# Patient Record
Sex: Female | Born: 1940 | Race: Black or African American | Hispanic: No | Marital: Single | Smoking: Former smoker
Health system: Southern US, Community
[De-identification: ages and names within clinical notes are randomized; demographics above are authoritative.]

## PROBLEM LIST (undated history)

## (undated) DIAGNOSIS — N184 Chronic kidney disease, stage 4 (severe): Secondary | ICD-10-CM

## (undated) DIAGNOSIS — I214 Non-ST elevation (NSTEMI) myocardial infarction: Secondary | ICD-10-CM

## (undated) DIAGNOSIS — I5030 Unspecified diastolic (congestive) heart failure: Secondary | ICD-10-CM

## (undated) DIAGNOSIS — Z720 Tobacco use: Secondary | ICD-10-CM

## (undated) DIAGNOSIS — N813 Complete uterovaginal prolapse: Secondary | ICD-10-CM

## (undated) DIAGNOSIS — I2510 Atherosclerotic heart disease of native coronary artery without angina pectoris: Secondary | ICD-10-CM

## (undated) DIAGNOSIS — I10 Essential (primary) hypertension: Secondary | ICD-10-CM

## (undated) DIAGNOSIS — E785 Hyperlipidemia, unspecified: Secondary | ICD-10-CM

## (undated) DIAGNOSIS — D509 Iron deficiency anemia, unspecified: Secondary | ICD-10-CM

## (undated) DIAGNOSIS — I421 Obstructive hypertrophic cardiomyopathy: Secondary | ICD-10-CM

## (undated) DIAGNOSIS — A0472 Enterocolitis due to Clostridium difficile, not specified as recurrent: Secondary | ICD-10-CM

## (undated) DIAGNOSIS — I447 Left bundle-branch block, unspecified: Secondary | ICD-10-CM

## (undated) DIAGNOSIS — Z8614 Personal history of Methicillin resistant Staphylococcus aureus infection: Secondary | ICD-10-CM

## (undated) DIAGNOSIS — I4891 Unspecified atrial fibrillation: Secondary | ICD-10-CM

## (undated) DIAGNOSIS — E119 Type 2 diabetes mellitus without complications: Secondary | ICD-10-CM

## (undated) DIAGNOSIS — Z9289 Personal history of other medical treatment: Secondary | ICD-10-CM

## (undated) DIAGNOSIS — J9690 Respiratory failure, unspecified, unspecified whether with hypoxia or hypercapnia: Secondary | ICD-10-CM

## (undated) HISTORY — DX: Hyperlipidemia, unspecified: E78.5

## (undated) HISTORY — DX: Obstructive hypertrophic cardiomyopathy: I42.1

## (undated) HISTORY — DX: Complete uterovaginal prolapse: N81.3

## (undated) HISTORY — DX: Iron deficiency anemia, unspecified: D50.9

## (undated) HISTORY — DX: Atherosclerotic heart disease of native coronary artery without angina pectoris: I25.10

## (undated) HISTORY — DX: Tobacco use: Z72.0

## (undated) HISTORY — DX: Personal history of Methicillin resistant Staphylococcus aureus infection: Z86.14

## (undated) HISTORY — DX: Unspecified atrial fibrillation: I48.91

## (undated) HISTORY — DX: Personal history of other medical treatment: Z92.89

## (undated) HISTORY — PX: TUBAL LIGATION: SHX77

## (undated) HISTORY — DX: Essential (primary) hypertension: I10

## (undated) HISTORY — DX: Type 2 diabetes mellitus without complications: E11.9

---

## 1998-03-01 ENCOUNTER — Emergency Department (HOSPITAL_COMMUNITY): Admission: EM | Admit: 1998-03-01 | Discharge: 1998-03-01 | Payer: Self-pay | Admitting: Emergency Medicine

## 1998-03-05 ENCOUNTER — Encounter: Admission: RE | Admit: 1998-03-05 | Discharge: 1998-03-05 | Payer: Self-pay | Admitting: Hematology and Oncology

## 1998-03-23 ENCOUNTER — Emergency Department (HOSPITAL_COMMUNITY): Admission: EM | Admit: 1998-03-23 | Discharge: 1998-03-23 | Payer: Self-pay | Admitting: Emergency Medicine

## 1998-03-26 ENCOUNTER — Encounter: Admission: RE | Admit: 1998-03-26 | Discharge: 1998-03-26 | Payer: Self-pay | Admitting: Hematology and Oncology

## 1998-07-10 ENCOUNTER — Emergency Department (HOSPITAL_COMMUNITY): Admission: EM | Admit: 1998-07-10 | Discharge: 1998-07-10 | Payer: Self-pay | Admitting: Emergency Medicine

## 1998-07-11 ENCOUNTER — Emergency Department (HOSPITAL_COMMUNITY): Admission: EM | Admit: 1998-07-11 | Discharge: 1998-07-11 | Payer: Self-pay | Admitting: Emergency Medicine

## 1998-10-29 ENCOUNTER — Encounter: Payer: Self-pay | Admitting: Internal Medicine

## 1998-10-30 ENCOUNTER — Inpatient Hospital Stay (HOSPITAL_COMMUNITY): Admission: EM | Admit: 1998-10-30 | Discharge: 1998-10-31 | Payer: Self-pay | Admitting: Internal Medicine

## 1999-07-20 ENCOUNTER — Emergency Department (HOSPITAL_COMMUNITY): Admission: EM | Admit: 1999-07-20 | Discharge: 1999-07-20 | Payer: Self-pay | Admitting: Emergency Medicine

## 1999-07-20 ENCOUNTER — Encounter: Payer: Self-pay | Admitting: Emergency Medicine

## 1999-08-13 ENCOUNTER — Encounter: Admission: RE | Admit: 1999-08-13 | Discharge: 1999-09-15 | Payer: Self-pay | Admitting: *Deleted

## 1999-09-03 ENCOUNTER — Encounter: Payer: Self-pay | Admitting: Occupational Medicine

## 1999-09-03 ENCOUNTER — Ambulatory Visit (HOSPITAL_COMMUNITY): Admission: RE | Admit: 1999-09-03 | Discharge: 1999-09-03 | Payer: Self-pay | Admitting: Occupational Medicine

## 1999-10-21 ENCOUNTER — Emergency Department (HOSPITAL_COMMUNITY): Admission: EM | Admit: 1999-10-21 | Discharge: 1999-10-22 | Payer: Self-pay | Admitting: Emergency Medicine

## 1999-11-17 ENCOUNTER — Emergency Department (HOSPITAL_COMMUNITY): Admission: EM | Admit: 1999-11-17 | Discharge: 1999-11-17 | Payer: Self-pay | Admitting: Emergency Medicine

## 1999-11-24 ENCOUNTER — Other Ambulatory Visit: Admission: RE | Admit: 1999-11-24 | Discharge: 1999-11-24 | Payer: Self-pay | Admitting: Obstetrics

## 2000-01-19 ENCOUNTER — Emergency Department (HOSPITAL_COMMUNITY): Admission: EM | Admit: 2000-01-19 | Discharge: 2000-01-19 | Payer: Self-pay | Admitting: Emergency Medicine

## 2000-01-19 ENCOUNTER — Encounter: Payer: Self-pay | Admitting: Emergency Medicine

## 2000-01-20 ENCOUNTER — Ambulatory Visit (HOSPITAL_COMMUNITY): Admission: RE | Admit: 2000-01-20 | Discharge: 2000-01-20 | Payer: Self-pay | Admitting: Emergency Medicine

## 2000-01-20 ENCOUNTER — Encounter: Payer: Self-pay | Admitting: Emergency Medicine

## 2000-01-22 ENCOUNTER — Ambulatory Visit (HOSPITAL_COMMUNITY): Admission: RE | Admit: 2000-01-22 | Discharge: 2000-01-22 | Payer: Self-pay | Admitting: Emergency Medicine

## 2000-01-22 ENCOUNTER — Encounter: Payer: Self-pay | Admitting: Emergency Medicine

## 2000-02-03 ENCOUNTER — Emergency Department (HOSPITAL_COMMUNITY): Admission: EM | Admit: 2000-02-03 | Discharge: 2000-02-04 | Payer: Self-pay | Admitting: Emergency Medicine

## 2000-02-04 ENCOUNTER — Encounter: Payer: Self-pay | Admitting: Emergency Medicine

## 2000-04-07 ENCOUNTER — Encounter: Payer: Self-pay | Admitting: Emergency Medicine

## 2000-04-07 ENCOUNTER — Emergency Department (HOSPITAL_COMMUNITY): Admission: EM | Admit: 2000-04-07 | Discharge: 2000-04-07 | Payer: Self-pay | Admitting: Emergency Medicine

## 2000-05-10 ENCOUNTER — Emergency Department (HOSPITAL_COMMUNITY): Admission: EM | Admit: 2000-05-10 | Discharge: 2000-05-10 | Payer: Self-pay | Admitting: Internal Medicine

## 2001-10-03 ENCOUNTER — Emergency Department (HOSPITAL_COMMUNITY): Admission: EM | Admit: 2001-10-03 | Discharge: 2001-10-03 | Payer: Self-pay | Admitting: Emergency Medicine

## 2001-10-04 ENCOUNTER — Emergency Department (HOSPITAL_COMMUNITY): Admission: EM | Admit: 2001-10-04 | Discharge: 2001-10-04 | Payer: Self-pay | Admitting: Emergency Medicine

## 2005-05-31 ENCOUNTER — Emergency Department (HOSPITAL_COMMUNITY): Admission: EM | Admit: 2005-05-31 | Discharge: 2005-05-31 | Payer: Self-pay | Admitting: Emergency Medicine

## 2005-07-07 ENCOUNTER — Ambulatory Visit (HOSPITAL_COMMUNITY): Admission: RE | Admit: 2005-07-07 | Discharge: 2005-07-07 | Payer: Self-pay | Admitting: Obstetrics

## 2006-12-20 ENCOUNTER — Encounter: Admission: RE | Admit: 2006-12-20 | Discharge: 2006-12-20 | Payer: Self-pay | Admitting: Internal Medicine

## 2007-10-24 ENCOUNTER — Ambulatory Visit (HOSPITAL_COMMUNITY): Admission: RE | Admit: 2007-10-24 | Discharge: 2007-10-24 | Payer: Self-pay | Admitting: Obstetrics

## 2008-10-06 ENCOUNTER — Emergency Department (HOSPITAL_COMMUNITY): Admission: EM | Admit: 2008-10-06 | Discharge: 2008-10-06 | Payer: Self-pay | Admitting: Emergency Medicine

## 2008-12-30 ENCOUNTER — Inpatient Hospital Stay (HOSPITAL_COMMUNITY): Admission: EM | Admit: 2008-12-30 | Discharge: 2009-01-03 | Payer: Self-pay | Admitting: Emergency Medicine

## 2008-12-30 ENCOUNTER — Ambulatory Visit: Payer: Self-pay | Admitting: Critical Care Medicine

## 2008-12-30 ENCOUNTER — Encounter (INDEPENDENT_AMBULATORY_CARE_PROVIDER_SITE_OTHER): Payer: Self-pay | Admitting: Internal Medicine

## 2008-12-30 ENCOUNTER — Ambulatory Visit: Payer: Self-pay | Admitting: Internal Medicine

## 2009-01-03 ENCOUNTER — Encounter: Payer: Self-pay | Admitting: Internal Medicine

## 2009-01-07 ENCOUNTER — Encounter: Payer: Self-pay | Admitting: Cardiology

## 2009-01-07 DIAGNOSIS — N185 Chronic kidney disease, stage 5: Secondary | ICD-10-CM

## 2009-01-08 ENCOUNTER — Encounter: Payer: Self-pay | Admitting: Cardiology

## 2009-01-08 LAB — CONVERTED CEMR LAB
BUN: 23 mg/dL (ref 6–23)
Calcium: 10.4 mg/dL (ref 8.4–10.5)
GFR calc non Af Amer: 35.96 mL/min (ref >60)
Glucose, Bld: 83 mg/dL (ref 70–99)
Potassium: 3.4 meq/L — ABNORMAL LOW (ref 3.5–5.1)
Sodium: 140 meq/L (ref 135–145)

## 2009-01-15 ENCOUNTER — Ambulatory Visit: Payer: Self-pay | Admitting: Cardiology

## 2009-01-15 DIAGNOSIS — E876 Hypokalemia: Secondary | ICD-10-CM

## 2009-01-17 LAB — CONVERTED CEMR LAB
CO2: 26 meq/L (ref 19–32)
Calcium: 10.3 mg/dL (ref 8.4–10.5)
Chloride: 110 meq/L (ref 96–112)
Creatinine, Ser: 1.6 mg/dL — ABNORMAL HIGH (ref 0.4–1.2)
Sodium: 141 meq/L (ref 135–145)

## 2009-01-20 ENCOUNTER — Telehealth: Payer: Self-pay | Admitting: Cardiology

## 2009-10-08 ENCOUNTER — Telehealth: Payer: Self-pay | Admitting: Cardiology

## 2009-10-28 DIAGNOSIS — I10 Essential (primary) hypertension: Secondary | ICD-10-CM | POA: Insufficient documentation

## 2009-10-28 DIAGNOSIS — I509 Heart failure, unspecified: Secondary | ICD-10-CM | POA: Insufficient documentation

## 2009-10-31 ENCOUNTER — Ambulatory Visit: Payer: Self-pay | Admitting: Cardiology

## 2009-10-31 DIAGNOSIS — F17200 Nicotine dependence, unspecified, uncomplicated: Secondary | ICD-10-CM | POA: Insufficient documentation

## 2009-11-04 LAB — CONVERTED CEMR LAB
BUN: 21 mg/dL (ref 6–23)
Chloride: 110 meq/L (ref 96–112)
GFR calc non Af Amer: 51.31 mL/min (ref >60)
Potassium: 4.7 meq/L (ref 3.5–5.1)
Sodium: 142 meq/L (ref 135–145)

## 2009-12-30 ENCOUNTER — Ambulatory Visit: Payer: Self-pay | Admitting: Internal Medicine

## 2009-12-31 ENCOUNTER — Inpatient Hospital Stay (HOSPITAL_COMMUNITY): Admission: EM | Admit: 2009-12-31 | Discharge: 2010-01-01 | Payer: Self-pay | Admitting: Emergency Medicine

## 2010-01-03 ENCOUNTER — Ambulatory Visit: Payer: Self-pay | Admitting: Internal Medicine

## 2010-01-03 ENCOUNTER — Inpatient Hospital Stay (HOSPITAL_COMMUNITY): Admission: EM | Admit: 2010-01-03 | Discharge: 2010-01-05 | Payer: Self-pay | Admitting: Emergency Medicine

## 2010-01-22 ENCOUNTER — Encounter: Payer: Self-pay | Admitting: Cardiology

## 2010-03-28 ENCOUNTER — Ambulatory Visit: Payer: Self-pay | Admitting: Cardiovascular Disease

## 2010-03-28 ENCOUNTER — Ambulatory Visit: Payer: Self-pay | Admitting: Pulmonary Disease

## 2010-03-28 ENCOUNTER — Inpatient Hospital Stay (HOSPITAL_COMMUNITY)
Admission: EM | Admit: 2010-03-28 | Discharge: 2010-04-17 | Payer: Self-pay | Source: Home / Self Care | Attending: Cardiology | Admitting: Cardiology

## 2010-03-28 ENCOUNTER — Ambulatory Visit: Payer: Self-pay | Admitting: Internal Medicine

## 2010-04-01 ENCOUNTER — Encounter: Payer: Self-pay | Admitting: Cardiology

## 2010-04-18 ENCOUNTER — Telehealth (INDEPENDENT_AMBULATORY_CARE_PROVIDER_SITE_OTHER): Payer: Self-pay | Admitting: *Deleted

## 2010-04-30 ENCOUNTER — Encounter: Payer: Self-pay | Admitting: Cardiology

## 2010-05-06 ENCOUNTER — Ambulatory Visit: Payer: Self-pay | Admitting: Cardiology

## 2010-05-06 ENCOUNTER — Encounter: Payer: Self-pay | Admitting: Cardiology

## 2010-05-06 DIAGNOSIS — R5381 Other malaise: Secondary | ICD-10-CM | POA: Insufficient documentation

## 2010-05-06 DIAGNOSIS — R5383 Other fatigue: Secondary | ICD-10-CM

## 2010-05-06 DIAGNOSIS — I428 Other cardiomyopathies: Secondary | ICD-10-CM

## 2010-05-06 DIAGNOSIS — I4891 Unspecified atrial fibrillation: Secondary | ICD-10-CM | POA: Insufficient documentation

## 2010-05-08 LAB — CONVERTED CEMR LAB
CO2: 27 meq/L (ref 19–32)
Calcium: 10.2 mg/dL (ref 8.4–10.5)
Chloride: 107 meq/L (ref 96–112)
Glucose, Bld: 87 mg/dL (ref 70–99)
Potassium: 4 meq/L (ref 3.5–5.1)
Sodium: 139 meq/L (ref 135–145)

## 2010-05-09 ENCOUNTER — Encounter: Payer: Self-pay | Admitting: Cardiology

## 2010-05-15 ENCOUNTER — Telehealth: Payer: Self-pay | Admitting: Cardiology

## 2010-05-18 ENCOUNTER — Encounter: Payer: Self-pay | Admitting: Cardiovascular Disease

## 2010-05-18 ENCOUNTER — Encounter: Payer: Self-pay | Admitting: Cardiology

## 2010-05-18 LAB — CONVERTED CEMR LAB: Prothrombin Time: 12 s

## 2010-05-20 ENCOUNTER — Telehealth: Payer: Self-pay | Admitting: Cardiology

## 2010-05-22 ENCOUNTER — Encounter: Payer: Self-pay | Admitting: Cardiology

## 2010-05-22 LAB — CONVERTED CEMR LAB
POC INR: 1.1
Prothrombin Time: 11 s

## 2010-05-27 ENCOUNTER — Encounter: Payer: Self-pay | Admitting: Internal Medicine

## 2010-05-27 ENCOUNTER — Encounter: Payer: Self-pay | Admitting: Cardiology

## 2010-05-27 LAB — CONVERTED CEMR LAB: POC INR: 1.2

## 2010-06-03 ENCOUNTER — Telehealth: Payer: Self-pay | Admitting: Cardiology

## 2010-06-03 ENCOUNTER — Encounter: Payer: Self-pay | Admitting: Cardiovascular Disease

## 2010-06-03 LAB — CONVERTED CEMR LAB: POC INR: 1.7

## 2010-06-08 ENCOUNTER — Encounter: Payer: Self-pay | Admitting: Cardiology

## 2010-06-09 NOTE — Miscellaneous (Signed)
  Clinical Lists Changes  Observations: Added new observation of CARDCATHFIND: CONCLUSIONS: 1. What appears left ventricular hypertrophy with what appears to be     vigorous left ventricular systolic function with mid cavity     obliteration 2. Scattered irregularities with moderate vessel ectasia.   DISPOSITION:  The patient will be seen by Dr. Antoine Poche.  Atrial fibrillation will need to be controlled.  She will need to be switched in terms of her drugs.     (10/31/2009 11:20)      Cardiac Cath  Procedure date:  10/31/2009  Findings:      CONCLUSIONS: 1. What appears left ventricular hypertrophy with what appears to be     vigorous left ventricular systolic function with mid cavity     obliteration 2. Scattered irregularities with moderate vessel ectasia.   DISPOSITION:  The patient will be seen by Dr. Antoine Poche.  Atrial fibrillation will need to be controlled.  She will need to be switched in terms of her drugs.      Appended Document: Norpace Question The patient should not restart the medication given the events in the hospital.  If she goes back into atrial fibrillation she would need to be rehospitalized to restar this medication

## 2010-06-09 NOTE — Assessment & Plan Note (Signed)
Summary: rov/jss   Visit Type:  Follow-up Primary Provider:  None  CC:  Diastolic Heart Failure.  History of Present Illness: The patient presents for her first office visit following hospitalization in August of last year. She had hypertensive urgency, acute on chronic diastolic heart failure in brief SVT. She has failed to show up for followup until now. She does report taking her medicines and actually checks blood pressures at home. She reports that typically elevated in the 160s systolic and above 100 diastolic. She has not reported any palpitations, presyncope or syncope. She's not had any of the acute shortness of breath she had last year. She does get short breath walking a city block. She sleeps chronically on 2 pillows. She is not describing any PND. She is not describing chest pressure, neck or arm discomfort. She has no weight gain or swelling. She says she avoids salt.  Current Medications (verified): 1)  Klor-Con M20 20 Meq Cr-Tabs (Potassium Chloride Crys Cr) .Marland Kitchen.. 1 Tablet Every Day 2)  Amlodipine Besylate 5 Mg Tabs (Amlodipine Besylate) .... Take One Tablet By Mouth Daily 3)  Metoprolol Tartrate 50 Mg Tabs (Metoprolol Tartrate) .Marland Kitchen.. 1 By Mouth Two Times A Day 4)  Iron 325 (65 Fe) Mg Tabs (Ferrous Sulfate) .Marland Kitchen.. 1 By Mouth Two Times A Day  Allergies (verified): No Known Drug Allergies  Past History:  Past Medical History: Hypertension.  CHF with preserved ejection fraction SVT Tobacco abuse Chronic renal insufficiency  Review of Systems       As stated in the HPI and negative for all other systems.   Vital Signs:  Patient profile:   70 year old female Height:      63 inches Weight:      165 pounds BMI:     29.33 Pulse rate:   55 / minute Resp:     18 per minute BP sitting:   162 / 82  (right arm)  Vitals Entered By: Marrion Coy, CNA (October 31, 2009 11:07 AM)  Physical Exam  General:  Well developed, well nourished, in no acute distress. Head:   normocephalic and atraumatic Eyes:  PERRLA/EOM intact; conjunctiva and lids normal. Neck:  Neck supple, no JVD. No masses, thyromegaly or abnormal cervical nodes. Chest Wall:  no deformities or breast masses noted Lungs:  Clear bilaterally to auscultation and percussion. Heart:  Non-displaced PMI, chest non-tender; regular rate and rhythm, S1, S2 without murmurs, rubs or gallops. Carotid upstroke normal, no bruit. Normal abdominal aortic size, no bruits. Femorals normal pulses, no bruits. Pedals normal pulses. No edema, no varicosities. Abdomen:  Bowel sounds positive; abdomen soft and non-tender without masses, organomegaly, or hernias noted. No hepatosplenomegaly. Msk:  Back normal, normal gait. Muscle strength and tone normal. Extremities:  No clubbing or cyanosis. Neurologic:  Alert and oriented x 3. Skin:  Intact without lesions or rashes. Cervical Nodes:  no significant adenopathy Psych:  Normal affect.   EKG  Procedure date:  10/31/2009  Findings:      Sinus bradycardia, rate 55, axis within normal limits, intervals within normal limits, left ventricular hypertrophy with repolarization changes  Impression & Recommendations:  Problem # 1:  HYPERTENSION (ICD-401.9) Her blood pressure is not controlled. I will increase her Norvasc to 10 mg daily. She will keep a check at home. I discussed with her the importance of getting a primary care doctor to follow her chronic issues. Orders: EKG w/ Interpretation (93000) TLB-BMP (Basic Metabolic Panel-BMET) (80048-METABOL)  Problem # 2:  CHF (ICD-428.0)  She seems to be euvolemic. No further cardiovascular testing is suggested. Orders: EKG w/ Interpretation (93000)  Problem # 3:  TOBACCO ABUSE (ICD-305.1) We discussed the need to stop smoking. She could not afford Chantix and so will try cold [place].  Problem # 4:  CHRONIC KIDNEY DISEASE STAGE V (ICD-585.5) I will check a basic metabolic profile today.  Patient Instructions: 1)   Your physician recommends that you schedule a follow-up appointment in: 6 months with Dr Antoine Poche 2)  Your physician recommends that you haver lab work today 3)  Your physician has recommended you make the following change in your medication: Increase Amlodipine to 10 mg  a day 4)  You have been diagnosed with Congestive Heart Failure or CHF.  CHF is a condition in which a problem with the structure or function of the heart impairs its ability to supply sufficient blood flow to meet the body's needs.  For further information please visit www.cardiosmart.org for detailed information on CHF. 5)  Your physician discussed the hazards of tobacco use.  Tobacco use cessation is recommended and techniques and options to help you quit were discussed. 6)  Your physician recommends that you weigh, daily, at the same time every day, and in the same amount of clothing.  Please record your daily weights on the handout provided and bring it to your next appointment. Prescriptions: AMLODIPINE BESYLATE 10 MG TABS (AMLODIPINE BESYLATE) one daily  #90 x 3   Entered by:   Charolotte Capuchin, RN   Authorized by:   Rollene Rotunda, MD, [facility]   Signed by:   Charolotte Capuchin, RN on 10/31/2009   Method used:   [facility]        [facility] [phone]* (retail)       [address]       [place], [place]  [address]       Ph: [phone] or [phone]       Fax: [phone]   RxID:   [id number]

## 2010-06-09 NOTE — Progress Notes (Signed)
Summary:  med & appt  Phone Note Call from Patient Call back at Home Phone [phone] Call back at [phone]   Caller: Patient Reason for Call: Refill Medication, Talk to Nurse Summary of Call: amilodipine besylate 5mg  tablets [facility]. Pt wants to make appt with Dr Excell Seltzer  Initial call taken by: Edman Circle,  October 08, 2009 9:54 AM  Follow-up for Phone Call        I spoke with the pt and she needs a refill on her Amlodipine.  This is a medication that was prescribed during her hospitalization in August of 2010. The pt would like to schedule an appt to follow-up with Dr Antoine Poche.  I will have Dr [place]'s scheduler contact the pt for an appt. Follow-up by: Julieta Gutting, RN, BSN,  October 08, 2009 7:10 PM    New/Updated Medications: AMLODIPINE BESYLATE 5 MG TABS (AMLODIPINE BESYLATE) Take one tablet by mouth daily Prescriptions: AMLODIPINE BESYLATE 5 MG TABS (AMLODIPINE BESYLATE) Take one tablet by mouth daily  #30 x 1   Entered by:   Julieta Gutting, RN, BSN   Authorized by:   Rollene Rotunda, MD, [facility]   Signed by:   Julieta Gutting, RN, BSN on 10/08/2009   Method used:   [facility] to        [facility] [phone]* (retail)       [address]       [place], [place]  [address]       Ph: [phone] or [phone]       Fax: [phone]   RxID:   [id number]

## 2010-06-10 ENCOUNTER — Encounter: Payer: Self-pay | Admitting: Internal Medicine

## 2010-06-10 ENCOUNTER — Encounter: Payer: Self-pay | Admitting: Cardiology

## 2010-06-11 NOTE — Progress Notes (Signed)
Summary: c/o b/p 142/90 . pls called in all refill  Phone Note Call from Patient Call back at Home Phone [phone] Message from:  Patient on June 03, 2010 10:52 AM  Refills Requested: Medication #1:  METOPROLOL TARTRATE 50 MG TABS 1 by mouth two times a day Caller: Daughter-cynthia [phone] Reason for Call: Talk to Nurse Summary of Call: c/o b/p 140/92 taken about 10 min ago. pls called in all refill for pt.  Initial call taken by: Lorne Skeens,  June 03, 2010 10:54 AM  Follow-up for Phone Call        I spoke with pt's daughter and reviewed all active meds with her post discharge from hospital and rehab.  [facility] [address]  Follow-up by: Lisabeth Devoid RN,  June 03, 2010 11:20 AM    Prescriptions: PROTONIX 40 MG TBEC (PANTOPRAZOLE SODIUM) 1 by mouth daily  #30 x 6   Entered by:   Lisabeth Devoid RN   Authorized by:   Rollene Rotunda, MD, [facility]   Signed by:   Lisabeth Devoid RN on 06/03/2010   Method used:   [facility]ronically to        [facility] [phone]* (retail)       [address]       [place], [place]  [address]       Ph: [phone] or [phone]       Fax: [phone]   RxID:   [id number] NITROSTAT 0.4 MG SUBL (NITROGLYCERIN) as needed  #25 x 6   Entered by:   Lisabeth Devoid RN   Authorized by:   Rollene Rotunda, MD, [facility]   Signed by:   Lisabeth Devoid RN on 06/03/2010   Method used:   [facility]ronically to        [facility] [phone]* (retail)       [address], [place]  [address]       Ph: [phone] or [phone]       Fax: [phone]   RxID:   [id number] DISOPYRAMIDE PHOSPHATE 100 MG CAPS (DISOPYRAMIDE PHOSPHATE) 1 by mouth q12 hours  #60 x 6   Entered by:   Lisabeth Devoid RN   Authorized by:   Rollene Rotunda, MD, [facility]   Signed by:   Lisabeth Devoid RN on 06/03/2010   Method used:   [facility]ronically to        [facility] [phone]* (retail)       [address], [place]  [address]       Ph: [phone] or [phone]  Fax: [phone]   RxID:   [id number] IRON 325 (65 FE) MG TABS (FERROUS SULFATE) 1 by mouth two times a day  #60 x 6   Entered by:   Lisabeth Devoid RN   Authorized by:   Rollene Rotunda, MD, [facility]   Signed by:   Lisabeth Devoid RN on 06/03/2010   Method used:   Electronically to        [facility] [phone]* (retail)       [address]       [place], [place]  [address]       Ph: [phone] or [phone]       Fax: [phone]   RxID:   [id number]

## 2010-06-11 NOTE — Medication Information (Signed)
Summary: Coumadin Clinic  Anticoagulant Therapy  Managed by: Bethena Midget, RN, BSN Referring MD: Antoine Poche PCP: None Supervising MD: Eden Emms MD, Cherry Turlington Indication 1: Atrial Fibrillation Lab Used: [facility] [place] Site: [address] INR POC 1.7 INR RANGE 2.0-3.0  Dietary changes: no    Health status changes: no    Bleeding/hemorrhagic complications: no    Recent/future hospitalizations: no    Any changes in medication regimen? no    Recent/future dental: no  Any missed doses?: no       Is patient compliant with meds? yes       Allergies: No Known Drug Allergies  Anticoagulation Management History:      Her anticoagulation is being managed by telephone today.  Positive risk factors for bleeding include an age of 70 years or older.  The bleeding index is 'intermediate risk'.  Positive CHADS2 values include History of CHF and History of HTN.  Negative CHADS2 values include Age > 46 years old.  Anticoagulation responsible provider: Eden Emms MD, Theron Arista.  INR POC: 1.7.    Anticoagulation Management Assessment/Plan:      The patient's current anticoagulation dose is Warfarin sodium 7.5 mg tabs: Use as directed by Anticoagulation Clinic.  The target INR is 2.0-3.0.  The next INR is due 06/10/2010.  Anticoagulation instructions were given to home health nurse.  Results were reviewed/authorized by Bethena Midget, RN, BSN.  She was notified by Bethena Midget, RN, BSN.         Prior Anticoagulation Instructions: INR 1.2 Today take extra 1/2 pill the change dose to 1 pill everyday except 1/2 pill on Tuesdays and Saturdays. Rechek in one week. Orders given to Debbie while she was at home.   Current Anticoagulation Instructions: INR 1.7 Today take extra 1/2 pill  then Change dose to 1 pill everyday except 1/2 pill on Tuesdays. Recheck in one week. Orders given to Debbie with [facility] while at home with pt.

## 2010-06-11 NOTE — Progress Notes (Signed)
Summary: CALLING ABOUT PTINR  Phone Note Other Incoming   Caller: [facility]/ Weston Anna [phone] Summary of Call: NEED TO KNOW WHEN THE PT NEXT PTINR NEED TO BE CHECKED Initial call taken by: Judie Grieve,  May 15, 2010 4:08 PM  Follow-up for Phone Call        [facility] for Debbie.  Weston Brass PharmD  May 15, 2010 4:47 PM   Additional Follow-up for Phone Call Additional follow up Details #1::        Spoke with Eunice Blase and informed her that pt was in rehab and we do not have any previous INR's or dosing. Thus, we would appreciate if she could check INR today.  She states she will attempt to add pt to schedule.  Additional Follow-up by: Bethena Midget, RN, BSN,  May 18, 2010 8:57 AM

## 2010-06-11 NOTE — Medication Information (Signed)
Summary: Coumadin Clinic  Anticoagulant Therapy  Managed by: Bethena Midget, RN, BSN Referring MD: Antoine Poche PCP: None Supervising MD: Graciela Husbands MD, Viviann Spare Indication 1: Atrial Fibrillation Lab Used: [facility] [place] Site: [address] INR POC 1.2 INR RANGE 2.0-3.0  Dietary changes: no    Health status changes: no    Bleeding/hemorrhagic complications: no    Recent/future hospitalizations: no    Any changes in medication regimen? no    Recent/future dental: no  Any missed doses?: no       Is patient compliant with meds? yes      Comments: Per Fabian Sharp nurse with [name]  Allergies: No Known Drug Allergies  Anticoagulation Management History:      Her anticoagulation is being managed by telephone today.  Positive risk factors for bleeding include an age of 70 years or older.  The bleeding index is 'intermediate risk'.  Positive CHADS2 values include History of CHF and History of HTN.  Negative CHADS2 values include Age > 14 years old.  Anticoagulation responsible provider: Graciela Husbands MD, Viviann Spare.  INR POC: 1.2.    Anticoagulation Management Assessment/Plan:      The patient's current anticoagulation dose is Coumadin 4 mg tabs: UAD.  The target INR is 2.0-3.0.  The next INR is due 06/03/2010.  Anticoagulation instructions were given to home health nurse.  Results were reviewed/authorized by Bethena Midget, RN, BSN.  She was notified by Bethena Midget, RN, BSN.         Prior Anticoagulation Instructions: INR 1.1 Today take 7.5mg s then change dose to 3.75mg s daily except 7.5mg s on Tuesdays and Saturdays. Recheck in one week. Orders given to [facility] nurse at [phone]  Current Anticoagulation Instructions: INR 1.2 Today take extra 1/2 pill the change dose to 1 pill everyday except 1/2 pill on Tuesdays and Saturdays. Rechek in one week. Orders given to Debbie while she was at home.

## 2010-06-11 NOTE — Medication Information (Signed)
Summary: Coumadin Clinic  Anticoagulant Therapy  Managed by: Bethena Midget, RN, BSN Referring MD: Antoine Poche PCP: None Supervising MD: Antoine Poche MD, Fayrene Fearing Indication 1: Atrial Fibrillation Lab Used: [facility] [place] Site: [address] PT 11.0 INR POC 1.1 INR RANGE 2.0-3.0  Dietary changes: no    Health status changes: no    Bleeding/hemorrhagic complications: no    Recent/future hospitalizations: no    Any changes in medication regimen? no    Recent/future dental: no  Any missed doses?: no       Is patient compliant with meds? yes       Allergies: No Known Drug Allergies  Anticoagulation Management History:      Her anticoagulation is being managed by telephone today.  Positive risk factors for bleeding include an age of 70 years or older.  The bleeding index is 'intermediate risk'.  Positive CHADS2 values include History of CHF and History of HTN.  Negative CHADS2 values include Age > 47 years old.  Prothrombin time is 11.0.  Anticoagulation responsible provider: Antoine Poche MD, Fayrene Fearing.  INR POC: 1.1.    Anticoagulation Management Assessment/Plan:      The patient's current anticoagulation dose is Coumadin 4 mg tabs: UAD.  The target INR is 2.0-3.0.  The next INR is due 05/27/2010.  Results were reviewed/authorized by Bethena Midget, RN, BSN.  She was notified by Bethena Midget, RN, BSN.         Prior Anticoagulation Instructions: INR 1.2  Spoke with [facility] RN while at pt's home advised to have pt start taking Coumadin 7.5mg  1/2 tablet daily.  Recheck INR on 05/22/10.    Current Anticoagulation Instructions: INR 1.1 Today take 7.5mg s then change dose to 3.75mg s daily except 7.5mg s on Tuesdays and Saturdays. Recheck in one week. Orders given to [facility] nurse at [phone]

## 2010-06-11 NOTE — Progress Notes (Signed)
Summary: Norpace Question  Phone Note From Other Clinic Call back at [phone]   Caller: Crystal from [facility] Reason for Call: Medication Check Summary of Call: (This is a late entry from 12/9 at 830p, as my internet service was out)   Returned call from skilled nursing facility concerning the inabilty of facility to get the pt's discharge medication norpace.  Their pharmacy did not have th rx and would not be able to get it until Monday.  They had also called pharmacys in the area that were unable to get the rx until Monday as well.  The facility was calling to see if the rx could be replaced by another rx.  I spoke with SK, as I was unsure the best way to approcach the issue.  He stated that if the facility was unable to get the rx then the pt would have to go without and not to restart the med on Monday.  The issue will need to be discussed with the pts primary physician, [facility] as whether to restart the rx or not.  I informed the facility of the conversation I had with SK and they voiced understanding.  They asked for our office to let them know if they needed to restart the med on Monday, I told them the nurse would call to let them know.  They were appreciative of the return call.   Initial call taken by: Robbi Garter NP-PA,  April 18, 2010 5:31 PM     Appended Document: Norpace Question The patient should not restart the medication given the events in the hospital.  If she goes back into atrial fibrillation she would need to be rehospitalized to restar this medication  Appended Document: Norpace Question Crystal aware pt doesn't need to restart Norpace at this time and if she goes back into At Fib then she would be to be rehospitalized and restarted then

## 2010-06-11 NOTE — Miscellaneous (Signed)
  Clinical Lists Changes  Observations: Added new observation of CARDCATHFIND: IMPRESSION:  The patient has diffuse mild-to-moderate coronary disease. There is no severe discrete stenosis and no culprit lesion.  The continuation of the AV circumflex beyond the first obtuse marginal had severe diffuse disease.  However, this was present to a lesser degree on the catheterization in August 2011.  I do not believe this is the source of her symptoms today.  Ejection fraction is preserved.  Heart rate was in the 40s during the procedure with the appearance of heart block.  We placed a temporary transvenous pacemaker.  Once the temporary transvenous pacemaker was placed, we were able to titrate the patient off dopamine.  We set the rate at 70 with good capture with a threshold of about 0.9 mA.  We will plan on restarting the heparin drip given her atrial fibrillation 6 hours post-sheath pull.     (03/31/2010 10:34)      Cardiac Cath  Procedure date:  03/31/2010  Findings:      IMPRESSION:  The patient has diffuse mild-to-moderate coronary disease. There is no severe discrete stenosis and no culprit lesion.  The continuation of the AV circumflex beyond the first obtuse marginal had severe diffuse disease.  However, this was present to a lesser degree on the catheterization in August 2011.  I do not believe this is the source of her symptoms today.  Ejection fraction is preserved.  Heart rate was in the 40s during the procedure with the appearance of heart block.  We placed a temporary transvenous pacemaker.  Once the temporary transvenous pacemaker was placed, we were able to titrate the patient off dopamine.  We set the rate at 70 with good capture with a threshold of about 0.9 mA.  We will plan on restarting the heparin drip given her atrial fibrillation 6 hours post-sheath pull.      Appended Document:     Clinical Lists Changes  Observations: Added new observation of  ECHOINTERP: - Left ventricle: Wall thickness was increased in a pattern of     moderate LVH. Systolic function was vigorous. The estimated     ejection fraction was in the range of 65% to 70%. There was     dynamic obstruction at rest in the outflow tract, with a peak     velocity of 511cm/sec and a peak gradient of Hg.   - Mitral valve: Calcified annulus. Mildly thickened leaflets .   - Left atrium: The atrium was moderately dilated. (04/01/2010 10:35)       Echocardiogram  Procedure date:  04/01/2010  Findings:      - Left ventricle: Wall thickness was increased in a pattern of     moderate LVH. Systolic function was vigorous. The estimated     ejection fraction was in the range of 65% to 70%. There was     dynamic obstruction at rest in the outflow tract, with a peak     velocity of 511cm/sec and a peak gradient of Hg.   - Mitral valve: Calcified annulus. Mildly thickened leaflets .   - Left atrium: The atrium was moderately dilated.

## 2010-06-11 NOTE — Progress Notes (Signed)
Summary: question s about getting generic brands in medications  Phone Note Call from Patient Call back at Home Phone [phone]   Caller: Daughter/ [name] Summary of Call: pt daughter regarding getting generic brand s of medication Initial call taken by: Judie Grieve,  May 20, 2010 1:37 PM  Follow-up for Phone Call        pt daughter adv that pt has applied for medicaid and will take a few more weeks to get. in the mean time she cannot afford her crestor and metoprolol. See that [facility] has Metoprolol on $4 list. Crestor is not on the list. Other options include pravastatin and lovastatin. Will provide samples of Crestor for a few weeks until Dr. can review and decide if alternative med is acceptable. Await pt to c/b to discuss. also since she doesn't have PCP-she also takes protonix and that is not on the $4 list. an option for that would be Famotidine.  Follow-up by: Claris Gladden RN,  May 20, 2010 2:09 PM  Additional Follow-up for Phone Call Additional follow up Details #1::        pt daughter is agreeable to samples and Metoprolol called into [facility] on [address]. She understands that Dr. will have to review the meds.  Additional Follow-up by: Claris Gladden RN,  May 20, 2010 2:17 PM    Additional Follow-up for Phone Call Additional follow up Details #2::    We could try pravastatin 80 mg with repeat lipid and liver in 8 weeks. Follow-up by: Rollene Rotunda, MD, [facility],  May 20, 2010 4:30 PM  New/Updated Medications: PRAVASTATIN SODIUM 80 MG TABS (PRAVASTATIN SODIUM) one daily Prescriptions: PRAVASTATIN SODIUM 80 MG TABS (PRAVASTATIN SODIUM) one daily  #30 x 11   Entered by:   Charolotte Capuchin, RN   Authorized by:   Rollene Rotunda, MD, [facility]   Signed by:   Charolotte Capuchin, RN on 05/22/2010   Method used:   Electronically to        [facility] [phone]* (retail)       [address]       [place], [place]  [address]       Ph: [phone]       Fax: [phone]   RxID:   [id number] METOPROLOL TARTRATE 50 MG TABS (METOPROLOL TARTRATE) 1 by mouth two times a day  #60 x 11   Entered by:   Claris Gladden RN   Authorized by:   Rollene Rotunda, MD, [facility]   Signed by:   Claris Gladden RN on 05/20/2010   Method used:   Electronically to        [facility] [phone]* (retail)       [address]       [place], [place]  [address]       Ph: [phone]       Fax: [phone]   RxID:   [id number]

## 2010-06-11 NOTE — Progress Notes (Signed)
Summary: refill request  Phone Note Refill Request Message from:  [facility]  Refills Requested: Medication #1:  METOPROLOL TARTRATE 50 MG TABS 1 by mouth two times a day  Medication #2:  CRESTOR 20 MG TABS Take one tablet by mouth daily.  Method Requested: Telephone to Pharmacy Initial call taken by: Glynda Jaeger,  May 20, 2010 10:14 AM  Follow-up for Phone Call        RX sent into pharmacy. Pt notified. Marrion Coy, CNA  May 20, 2010 10:32 AM  Follow-up by: Marrion Coy, CNA,  May 20, 2010 10:32 AM    Prescriptions: METOPROLOL TARTRATE 50 MG TABS (METOPROLOL TARTRATE) 1 by mouth two times a day  #60 x 6   Entered by:   Marrion Coy, CNA   Authorized by:   Rollene Rotunda, MD, [facility]   Signed by:   Marrion Coy, CNA on 05/20/2010   Method used:   [facility]        [facility] [phone]* (retail)       [address], [place]  [address]       Ph: [phone] or [phone]       Fax: [phone]   RxID:   [id number] CRESTOR 20 MG TABS (ROSUVASTATIN CALCIUM) Take one tablet by mouth daily.  #30 x 6   Entered by:   Marrion Coy, CNA   Authorized by:   Rollene Rotunda, MD, [facility]   Signed by:   Marrion Coy, CNA on 05/20/2010   Method used:   [facility]        [facility] [phone]* (retail)       [address]       [place], [place]  [address]       Ph: [phone] or [phone]       Fax: [phone]   RxID:   [id number]

## 2010-06-11 NOTE — Medication Information (Signed)
Summary: Coumadin Clinic  Anticoagulant Therapy  Managed by: Cloyde Reams, RN, BSN Referring MD: Antoine Poche PCP: None Supervising MD: Excell Seltzer MD, Casimiro Needle Indication 1: Atrial Fibrillation Lab Used: [facility] [place] Site: [address] PT 12.0 INR POC 1.2 INR RANGE 2.0-3.0  Dietary changes: no    Health status changes: yes       Details: discharged from rehab recently on Coumadin 7.5mg  4 days a week.   Bleeding/hemorrhagic complications: no     Any changes in medication regimen? no     Any missed doses?: no       Is patient compliant with meds? yes      Comments: Needs rx sent to pharmacy for Ferrex 150mg  once daily, Metoprolol 50mg  two times a day, Crestor 10mg  once daily, Protonix 40mg  once daily sent flag to Marrion Coy to [facility]ill [facility] [address]   Allergies: No Known Drug Allergies  Anticoagulation Management History:      Her anticoagulation is being managed by telephone today.  Positive risk factors for bleeding include an age of 21 years or older.  The bleeding index is 'intermediate risk'.  Positive CHADS2 values include History of CHF and History of HTN.  Negative CHADS2 values include Age > 54 years old.  Prothrombin time is 12.0.  Anticoagulation responsible provider: Excell Seltzer MD, Casimiro Needle.  INR POC: 1.2.    Anticoagulation Management Assessment/Plan:      The patient's current anticoagulation dose is Coumadin 4 mg tabs: UAD.  The target INR is 2.0-3.0.  The next INR is due 05/22/2010.  Results were reviewed/authorized by Cloyde Reams, RN, BSN.  She was notified by Cloyde Reams RN.         Current Anticoagulation Instructions: INR 1.2  Spoke with [facility] RN while at pt's home advised to have pt start taking Coumadin 7.5mg  1/2 tablet daily.  Recheck INR on 05/22/10.

## 2010-06-11 NOTE — Letter (Signed)
Summary: Physician Verbal Orders  Physician Verbal Orders   Imported By: Marylou Mccoy 06/01/2010 17:27:05  _____________________________________________________________________  External Attachment:    Type:   Image     Comment:   External Document

## 2010-06-11 NOTE — Assessment & Plan Note (Signed)
Summary: EPH/JML   Visit Type:  Follow-up Primary Provider:  None  CC:  Atrial fibrillation and HCM.  History of Present Illness: The patient presents for followup after hospitalization which started with atrial fibrillation. She subsequently had a bradycardic arrest requiring intubation. At that time she had urgent catheterization but was not found to have obstructive coronary disease. She has a temporary pacemaker but no subsequent bradycardia arrhythmias. She did have prolonged intubation mostly related to poor mechanics. The remainder of her hospitalization was complicated by lethargy and altered mental status. She has since been in rehabilitation and today is her last day before going home. She doesn't remember the events. She has had no acute complaints since discharge. She's had no chest pressure, neck or arm discomfort. She's had no palpitations, presyncope or syncope. She's had no shortness of breath, PND or orthopnea. She's had no weight gain or edema. She is ambulating with a walker and has completed physical therapy.  Current Medications (verified): 1)  Metoprolol Tartrate 50 Mg Tabs (Metoprolol Tartrate) .Marland Kitchen.. 1 By Mouth Two Times A Day 2)  Iron 325 (65 Fe) Mg Tabs (Ferrous Sulfate) .Marland Kitchen.. 1 By Mouth Two Times A Day 3)  Crestor 20 Mg Tabs (Rosuvastatin Calcium) .... Take One Tablet By Mouth Daily. 4)  Tylenol Infants 80 Mg/0.73ml Susp (Acetaminophen) .... As Needed 5)  Aspirin 81 Mg  Tabs (Aspirin) .Marland Kitchen.. 1 By Mouth Daily 6)  Disopyramide Phosphate 100 Mg Caps (Disopyramide Phosphate) .Marland Kitchen.. 1 By Mouth Q12 Hours 7)  Robafen Ac 100-10 Mg/95ml Syrp (Guaifenesin-Codeine) .... As Needed 8)  Mi-Acid 200-200-20 Mg/7ml Susp (Alum & Mag Hydroxide-Simeth) .... Uad 9)  Nitrostat 0.4 Mg Subl (Nitroglycerin) .... As Needed 10)  Protonix 40 Mg Tbec (Pantoprazole Sodium) .Marland Kitchen.. 1 By Mouth Daily 11)  Coumadin 4 Mg Tabs (Warfarin Sodium) .... Uad  Allergies (verified): No Known Drug Allergies  Past  History:  Past Medical History: Acute myocardial infarction (non obstructive CAD) Atrial fibrillation/rapid ventricular response (RVR) Tachy-brady syndrome requiring temporary pacemaker. Pulseless electrical activity (PEA) arrest. 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 Hypertension. Tobacco use. Chronic kidney disease stage 4  Anticoagulation with Coumadin Total procidentia (uterine prolapse), follow up as an outpatient. Iron-deficiency anemia.Hypertension.  CHF with preserved ejection fraction  Vital Signs:  Patient profile:   70 year old female Height:      63 inches Weight:      158 pounds BMI:     28.09 Pulse rate:   68 / minute Resp:     16 per minute BP sitting:   138 / 80  (right arm)  Vitals Entered By: Marrion Coy, CNA (May 06, 2010 10:06 AM)  Physical Exam  General:  Well developed, well nourished, in no acute distress. Head:  normocephalic and atraumatic Neck:  Neck supple, no JVD. No masses, thyromegaly or abnormal cervical nodes. Chest Wall:  no deformities or breast masses noted Lungs:  Clear bilaterally to auscultation and percussion. Abdomen:  Bowel sounds positive; abdomen soft and non-tender without masses, organomegaly, or hernias noted. No hepatosplenomegaly. Msk:  Back normal, normal gait. Muscle strength and tone normal. Extremities:  No clubbing or cyanosis. Neurologic:  Alert and oriented x 3. Skin:  Intact without lesions or rashes. Cervical Nodes:  no significant adenopathy Inguinal Nodes:  no significant adenopathy Psych:  Normal affect.   Detailed Cardiovascular Exam  Neck    Carotids: Carotids full and equal bilaterally without bruits.      Neck Veins:  Normal, no JVD.    Heart    Inspection: no deformities or lifts noted.      Palpation: normal PMI with no thrills palpable.      Auscultation: S1 and S2 within normal limits, no S3, no S4, 3/6 apical systolic murmur  radiating out aortic outflow tract, increased with the strain phase of Valsalva  Vascular    Abdominal Aorta: no palpable masses, pulsations, or audible bruits.      Femoral Pulses: normal femoral pulses bilaterally.      Pedal Pulses: normal pedal pulses bilaterally.      Radial Pulses: normal radial pulses bilaterally.      Peripheral Circulation: no clubbing, cyanosis, or edema noted with normal capillary refill.     EKG  Procedure date:  05/06/2010  Findings:      Sinus rhythm, rate 68, premature ectopic complexes, LVH with repolarization changes  Impression & Recommendations:  Problem # 1:  CARDIOMYOPATHY, IDIOPATHIC HYPERTROPHIC (ICD-425.4) The patient seems to be euvolemic. She will continue with meds as listed with concentration on blood pressure control and salt and fluid management.  Problem # 2:  FIBRILLATION, ATRIAL (ICD-427.31) The patient seems to be maintaining sinus rhythm with no evidence of bradycardia arrhythmia that prompted her arrest.  She will continue with current meds. We will have her Coumadin followed here.  Problem # 3:  TOBACCO ABUSE (ICD-305.1) She is abstaining and I have encouraged continued abstinence.  Problem # 4:  WEAKNESS (ICD-780.79) We discussed at length strategies for avoiding falls in her home now that she will be living independently.  Other Orders: EKG w/ Interpretation (93000) TLB-BMP (Basic Metabolic Panel-BMET) (80048-METABOL)  Patient Instructions: 1)  Your physician recommends that you schedule a follow-up appointment in: 4 months with Dr Antoine Poche 2)  Your physician recommends that you have  lab work in: [facility]  today 3)  Your physician recommends that you continue on your current medications as directed. Please refer to the Current Medication list given to you today.

## 2010-06-17 NOTE — Medication Information (Addendum)
Summary: Coumadin Clinic  Anticoagulant Therapy  Managed by: Bethena Midget, RN, BSN Referring MD: Antoine Poche PCP: None Supervising MD: Ladona Ridgel MD, Sharlot Gowda Indication 1: Atrial Fibrillation Lab Used: [facility] [place] Site: [address] INR POC 1.1 INR RANGE 2.0-3.0  Dietary changes: no    Health status changes: no    Bleeding/hemorrhagic complications: no    Recent/future hospitalizations: no    Any changes in medication regimen? no    Recent/future dental: no  Any missed doses?: no       Is patient compliant with meds? yes       Allergies: No Known Drug Allergies  Anticoagulation Management History:      Her anticoagulation is being managed by telephone today.  Positive risk factors for bleeding include an age of 88 years or older.  The bleeding index is 'intermediate risk'.  Positive CHADS2 values include History of CHF and History of HTN.  Negative CHADS2 values include Age > 36 years old.  Anticoagulation responsible provider: Ladona Ridgel MD, Sharlot Gowda.  INR POC: 1.1.    Anticoagulation Management Assessment/Plan:      The patient's current anticoagulation dose is Warfarin sodium 7.5 mg tabs: Use as directed by Anticoagulation Clinic.  The target INR is 2.0-3.0.  The next INR is due 06/19/2010.  Anticoagulation instructions were given to home health nurse.  Results were reviewed/authorized by Bethena Midget, RN, BSN.  She was notified by Bethena Midget, RN, BSN.         Prior Anticoagulation Instructions: INR 1.7 Today take extra 1/2 pill  then Change dose to 1 pill everyday except 1/2 pill on Tuesdays. Recheck in one week. Orders given to Debbie with [facility] while at home with pt.   Current Anticoagulation Instructions: INR 1.1 Today and tomorrow take 11.25mg s. Then change dose to 7.5mg s daily except 11.25mg s on Sundays. Recheck in 9 days. Orders givne to Debbie with [facility] while at home with pt.

## 2010-06-18 DIAGNOSIS — I4891 Unspecified atrial fibrillation: Secondary | ICD-10-CM

## 2010-06-19 ENCOUNTER — Encounter: Payer: Self-pay | Admitting: Cardiology

## 2010-06-19 ENCOUNTER — Telehealth: Payer: Self-pay | Admitting: Cardiology

## 2010-06-19 ENCOUNTER — Encounter (INDEPENDENT_AMBULATORY_CARE_PROVIDER_SITE_OTHER): Payer: Medicaid Other

## 2010-06-19 DIAGNOSIS — Z7901 Long term (current) use of anticoagulants: Secondary | ICD-10-CM

## 2010-06-19 DIAGNOSIS — I4891 Unspecified atrial fibrillation: Secondary | ICD-10-CM

## 2010-06-25 NOTE — Miscellaneous (Signed)
Summary: Home Health Certification/Care Plan   Home Health Certification/Care Plan   Imported By: Roderic Ovens 06/15/2010 14:36:17  _____________________________________________________________________  External Attachment:    Type:   Image     Comment:   External Document

## 2010-06-25 NOTE — Progress Notes (Addendum)
Summary: DO NOT TAKE Disopyramide  I spoke with pt's daughter [name]) who is aware to not restart disopyramide.  She is with pt and will let her know.  [name], RN    ---- Converted from flag ---- ---- 06/19/2010 4:00 PM, Rollene Rotunda, MD, [facility] wrote: Elita Quick,  I am very leary of restarting the disopyramide on [name] because, probably coincidently, she had a bradycardic arrest soon after starting it in the hospital.  Therefore, she should stay off of it for now.  We will deal with it if she has further atrial arrhythmias.  Please let her daughter know and turn this note into a phone note.  Thanks.  Jake  ---- 06/19/2010 1:22 PM, Windell Hummingbird wrote: Dr. Antoine Poche,   We saw your patient [name] today in the Coumadin Clinic and she brought with her her disopyramide 100mg  capsules. She informed us that she has not taken these capsules since leaving the nursing home approximately 1 month ago.  At first they were too expensive because her Medicaid had not become effective and then once she did get them she did not recognize the capsule so she did not want to take it then either.  Please let her know if you just want her to start on her dose she has at home or if you want to monitor her when she restarts this medication.  Contact number for her daughter [phone]  Thank you. ------------------------------

## 2010-06-25 NOTE — Medication Information (Signed)
Summary: Coumadin Clinic  Anticoagulant Therapy  Managed by: Weston Brass, PharmD Referring MD: Antoine Poche PCP: None Supervising MD: Daleen Squibb MD, Maisie Fus Indication 1: Atrial Fibrillation Lab Used: [facility] [place] Site: [address] INR POC 2.9 INR RANGE 2.0-3.0  Dietary changes: no    Health status changes: no    Bleeding/hemorrhagic complications: no    Recent/future hospitalizations: no    Any changes in medication regimen? no    Recent/future dental: no  Any missed doses?: no       Is patient compliant with meds? yes       Problems Prior to Update: 1)  Cardiomyopathy, Idiopathic Hypertrophic  (ICD-425.4) 2)  Fibrillation, Atrial  (ICD-427.31) 3)  Weakness  (ICD-780.79) 4)  Tobacco Abuse  (ICD-305.1) 5)  CHF  (ICD-428.0) 6)  Hypertension  (ICD-401.9) 7)  Hypopotassemia  (ICD-276.8) 8)  Chronic Kidney Disease Stage V  (ICD-585.5)  Medications Prior to Update: 1)  Metoprolol Tartrate 50 Mg Tabs (Metoprolol Tartrate) .Marland Kitchen.. 1 By Mouth Two Times A Day 2)  Iron 325 (65 Fe) Mg Tabs (Ferrous Sulfate) .Marland Kitchen.. 1 By Mouth Two Times A Day 3)  Tylenol Infants 80 Mg/0.46ml Susp (Acetaminophen) .... As Needed 4)  Aspirin 81 Mg  Tabs (Aspirin) .Marland Kitchen.. 1 By Mouth Daily 5)  Disopyramide Phosphate 100 Mg Caps (Disopyramide Phosphate) .Marland Kitchen.. 1 By Mouth Q12 Hours 6)  Robafen Ac 100-10 Mg/72ml Syrp (Guaifenesin-Codeine) .... As Needed 7)  Mi-Acid 200-200-20 Mg/50ml Susp (Alum & Mag Hydroxide-Simeth) .... Uad 8)  Nitrostat 0.4 Mg Subl (Nitroglycerin) .... As Needed 9)  Protonix 40 Mg Tbec (Pantoprazole Sodium) .Marland Kitchen.. 1 By Mouth Daily 10)  Warfarin Sodium 7.5 Mg Tabs (Warfarin Sodium) .... Use As Directed By Anticoagulation Clinic 11)  Pravastatin Sodium 80 Mg Tabs (Pravastatin Sodium) .... One Daily  Allergies: No Known Drug Allergies  Anticoagulation Management History:      The patient is taking warfarin and comes in today for a routine follow up visit.  Positive risk  factors for bleeding include an age of 55 years or older.  The bleeding index is 'intermediate risk'.  Positive CHADS2 values include History of CHF and History of HTN.  Negative CHADS2 values include Age > 79 years old.  Anticoagulation responsible provider: Daleen Squibb MD, Maisie Fus.  INR POC: 2.9.  Cuvette Lot#: [id number].  Exp: 05/2011.    Anticoagulation Management Assessment/Plan:      The patient's current anticoagulation dose is Warfarin sodium 7.5 mg tabs: Use as directed by Anticoagulation Clinic.  The target INR is 2.0-3.0.  The next INR is due 06/26/2010.  Anticoagulation instructions were given to home health nurse.  Results were reviewed/authorized by Weston Brass, PharmD.  She was notified by Margot Chimes PharmD Candidate.         Prior Anticoagulation Instructions: INR 1.1 Today and tomorrow take 11.25mg s. Then change dose to 7.5mg s daily except 11.25mg s on Sundays. Recheck in 9 days. Orders givne to Debbie with [facility] while at home with pt.   Current Anticoagulation Instructions: INR 2.9  Continue your same dose of Coumadin.  Take 1 tablet everyday except on Sundays when you take 1 and 1/2 tablets.  Recheck INR in 1 week.  Please call our office if you restart your heart medication.  If you do not hear from Dr. Antoine Poche by Tuesday please give him a call.

## 2010-06-26 ENCOUNTER — Encounter (INDEPENDENT_AMBULATORY_CARE_PROVIDER_SITE_OTHER): Payer: Medicare Other

## 2010-06-26 ENCOUNTER — Encounter: Payer: Self-pay | Admitting: Cardiovascular Disease

## 2010-06-26 DIAGNOSIS — Z7901 Long term (current) use of anticoagulants: Secondary | ICD-10-CM

## 2010-06-26 DIAGNOSIS — I4891 Unspecified atrial fibrillation: Secondary | ICD-10-CM

## 2010-06-26 LAB — CONVERTED CEMR LAB: POC INR: 1.5

## 2010-07-01 NOTE — Medication Information (Signed)
Summary: rov/cb  Anticoagulant Therapy  Managed by: Weston Brass, PharmD Referring MD: Antoine Poche PCP: None Supervising MD: Shacara Cozine Indication 1: Atrial Fibrillation Lab Used: [facility] [place] Site: [address] INR POC 1.5 INR RANGE 2.0-3.0  Dietary changes: no    Health status changes: no    Bleeding/hemorrhagic complications: no    Recent/future hospitalizations: no    Any changes in medication regimen? no    Recent/future dental: no  Any missed doses?: no       Is patient compliant with meds? yes      Comments: Patient was instructed not to restart disopyramide  Allergies: No Known Drug Allergies  Anticoagulation Management History:      The patient is taking warfarin and comes in today for a routine follow up visit.  Positive risk factors for bleeding include an age of 70 years or older.  The bleeding index is 'intermediate risk'.  Positive CHADS2 values include History of CHF and History of HTN.  Negative CHADS2 values include Age > 30 years old.  Anticoagulation responsible provider: Seaira Byus.  INR POC: 1.5.  Cuvette Lot#: [id number].  Exp: 06/2011.    Anticoagulation Management Assessment/Plan:      The patient's current anticoagulation dose is Warfarin sodium 7.5 mg tabs: Use as directed by Anticoagulation Clinic.  The target INR is 2.0-3.0.  The next INR is due 07/06/2010.  Anticoagulation instructions were given to home health nurse.  Results were reviewed/authorized by Weston Brass, PharmD.  She was notified by Margot Chimes PharmD Candidate.         Prior Anticoagulation Instructions: INR 2.9  Continue your same dose of Coumadin.  Take 1 tablet everyday except on Sundays when you take 1 and 1/2 tablets.  Recheck INR in 1 week.  Please call our office if you restart your heart medication.  If you do not hear from Dr. Antoine Poche by Tuesday please give him a call.    Current Anticoagulation Instructions: INR 1.5   Take an extra 1/2 tablet  today.  We changed your dose to 1 tablet everyday except on Tuesdays and Saturdays when you take 1 and 1/2 tablets.  Recheck INR in 10 days.

## 2010-07-01 NOTE — Miscellaneous (Signed)
Summary: [facility] [employer] Physician Verbal Orders  [facility] [place] Physician Verbal Orders   Imported By: Roderic Ovens 06/24/2010 14:22:01  _____________________________________________________________________  External Attachment:    Type:   Image     Comment:   External Document

## 2010-07-01 NOTE — Miscellaneous (Signed)
Summary: [facility] Physician Verbal Order  [facility] Physician Verbal Order   Imported By: Roderic Ovens 06/26/2010 15:00:36  _____________________________________________________________________  External Attachment:    Type:   Image     Comment:   External Document

## 2010-07-03 NOTE — H&P (Addendum)
NAME:  [name], [name] NO.:  [id number]  MEDICAL RECORD NO.:  [id number]          PATIENT TYPE:  INP  LOCATION:  1823                         FACILITY:  [facility]  PHYSICIAN:  Florinda Marker, MD DATE OF BIRTH:  1940-09-22  DATE OF ADMISSION:  01/03/2010 DATE OF DISCHARGE:                             HISTORY & PHYSICAL  PHYSICIAN:  Primary cardiologist, Dr. Antoine Poche with [facility].  CHIEF COMPLAINT:  Palpitations, chest pain and shortness of breath.  HISTORY OF PRESENT ILLNESS:  [name] is a 70 year old female with a history of uncontrolled hypertension, heart failure with preserved EF, paroxysmal atrial fibrillation and nonobstructive disease, who presents with an hour of tachy palpitations, shortness of breath and chest pain. Of note, the patient was just discharged on January 01, 2010 when she had a 2-day admission for AFib with RVR.  A fib with a heart ER and was found to have positive biomarkers. She was rate controlled and had spontaneous resolution of her atrial fibrillation.  Of note, the patient had positive biomarkers. She underwent a cardiac catheterization on December 31, 2009, which revealed nonobstructive disease.  She had a 40% mid LAD lesion with 30% diagonal lesion.  She had a 30-40% circumflex lesion and mild luminal irregularities in her RCA. Her EF by ventriculogram was found to be 70%, with no wall motion abnormalities. The patient was discharged on metoprolol 25 mg b.i.d. and full-dose aspirin.  Of note, she reports that she has not been able to have her medications filled.  She did give her prescriptions to her son who worked throughout the night and is supposed to pick up her medications after work today.  Around 11 a.m., the patient developed a very rapid heart rate and shortly thereafter developed significant shortness of breath and inability to catch her breath.  She also complained of substernal chest pressure.  In the ED  the patient was found to have a heart rate in the 150s and a blood pressure of 121/85.  She was given multiple doses of 10 mg of IV diltiazem and ultimately started on a diltiazem drip with her rate coming down in the 60s to 80s.  She was also given 40 mg of IV Lasix and has had resolution of her chest pain.  Her second set of biomarkers checked 4 hours apart has come back positive, see laboratory values.  Of note, the patient's chest pain was about 5-6/10 without radiation. It was associated with shortness of breath and mild diaphoresis.  No nausea or vomiting.  The patient has two-pillow orthopnea.  No lower extremity edema.  Before tonight's tachy palpitations, the patient last experienced palpitations on August 23rd and 24th, which was at the time of her admission.  Otherwise her review of systems is negative.  She does voice that she is compliant with her medications and does not miss any.  PAST MEDICAL HISTORY: 1. Hypertension.  History of uncontrolled blood pressure and a     question of noncompliance versus health literacy issues.     Hospitalization for hypertensive urgency in August 2010. 2. Nonobstructive coronary artery disease status post left  heart     catheterization on December 31, 2009.  History of NSTEMI in August     2010.  NSTEMI on December 31, 2009, with last heart catheterization     showing nonobstructive coronary artery disease. 3. Paroxysmal atrial fibrillation.  CHADS score of 1.  Hospitalization     August 24th to 25th, discharged on rate control and full-dose     aspirin therapy. 4. Heart failure with preserved EF. 5. History of tobacco abuse.  SOCIAL HISTORY:  Retired.  Smokes 1.5 packs every two days for 40 years. No alcohol or illicit drug use.  FAMILY HISTORY:  Negative for coronary artery disease and atrial fibrillation.  ALLERGIES:  No known drug allergies.  MEDICATIONS:  Metoprolol 25 mg b.i.d., full-dose aspirin 325 mg daily, Crestor 20 mg  q.h.s., amlodipine 5 mg daily.  REVIEW OF SYSTEMS:  As mentioned in the HPI, otherwise a 12-point review of systems is negative.  PHYSICAL EXAMINATION:  GENERAL:  The patient is in no acute distress. VITALS  Temperature 98.8, blood pressure 111/86, heart rate 68 and regular, respiratory rate is 18, satting 100% on room-air. HEENT:  Normocephalic, atraumatic. NECK:  No carotid bruit.  JVP as 7-cm of water. LUNGS:  Crackles bilaterally up to the mid back. CARDIOVASCULAR:  Irregularly irregular with a 2/6 holosystolic murmur heard best at the apex.  Normal S1-S2. ABDOMEN:  Soft, nontender, nondistended.  Plus bowel sounds. EXTREMITIES:  Right groin without bruising, hematoma or bruit, 2+ femoral pulses bilaterally, 2+ DP and PT pulses.  No peripheral edema. SKIN:  No evidence of rash.  LABORATORY DATA:  CBC:  White count 8.3, hematocrit 34.3, platelets 257. Hemoglobin A1c is 5.9.  BMP:  K is 3.6, creatinine is 1.25.  GFR is 51. Troponin-I checked at 5:55:  CK is 181, CK-MB 14.4, troponin-I is 1.23. BNP is 383 which is down from 476 on December 31, 2009.  TSH checked on December 31, 2009 was 0.93.  Chest x-ray is pending.  ANCILLARY STUDIES:  A 2-D echo on December 31, 2009 showed an EF of 55-60% with severe asymmetric LVH with a septum measuring 1.8-cm.  There was not any systolic anterior motion of the mitral valve.  There was dynamic obstructive gradient at rest of 65. She had grade II diastolic dysfunction.  No evidence of aortic stenosis.  There is mild mitral regurgitation.  Results suggestive of hypertrophic cardiomyopathy.  ASSESSMENT:  This is a 70 year old female with a history of paroxysmal atrial fibrillation with prior RVR, hypertension and hypertrophic cardiomyopathy, who is being readmitted for atrial fibrillation in the setting of not taking her medications.  This is not totally noncompliance, but the patient was discharged and did not have her medicines refilled. Her rate  was easily brought under control with diltiazem.  She is currently in atrial fibrillation with a rate between 60s and 80s.  She does have evidence of demand ischemia with positive biomarkers. We will plan to admit her for observation and diurese her and transition her to p.o. therapy. 1. Paroxysmal atrial fibrillation with demand ischemia.  For now will     continue the heparin drip for the next 12 to 24 hours. The patient     can be transitioned back to her p.o. full-dose aspirin.  Will start     metoprolol 25 mg q.6 hours and the plan will be to titrate her off     of the diltiazem drip. 2. Volume overload.  Will give an additional 40 mg of  IV Lasix at 1:00     p.m. 3. Hyperlipidemia.  Continue Crestor. 4. Hypertrophic cardiomyopathy.  The patient will need to be on a     negative inotropic agent. Metoprolol is ideal in this setting.  She     had no evidence of SAM on her echo. 5. Health literacy issues versus medication noncompliance. Would     obtain a social work consult this morning. 6. Arrangements to be made for patient to follow up with Dr. Rollene Rotunda. 7. Disposition.  Consider discharge over the next 24 hours.     Florinda Marker, MD    MLA/MEDQ  D:  01/03/2010  T:  01/03/2010  Job:  [id number]  Electronically Signed by Docia Furl MD on 07/03/2010 11:28:43 AM

## 2010-07-06 ENCOUNTER — Encounter: Payer: Self-pay | Admitting: Internal Medicine

## 2010-07-06 ENCOUNTER — Encounter (INDEPENDENT_AMBULATORY_CARE_PROVIDER_SITE_OTHER): Payer: Medicare Other

## 2010-07-06 DIAGNOSIS — Z7901 Long term (current) use of anticoagulants: Secondary | ICD-10-CM

## 2010-07-06 DIAGNOSIS — I4891 Unspecified atrial fibrillation: Secondary | ICD-10-CM

## 2010-07-07 NOTE — Letter (Signed)
Summary: [facility] - Physician Orders  [facility] - Physician Orders   Imported By: Marylou Mccoy 07/02/2010 13:09:21  _____________________________________________________________________  External Attachment:    Type:   Image     Comment:   External Document

## 2010-07-07 NOTE — Letter (Signed)
Summary: Physician Verbal Order  Physician Verbal Order   Imported By: Marylou Mccoy 07/02/2010 13:06:41  _____________________________________________________________________  External Attachment:    Type:   Image     Comment:   External Document

## 2010-07-16 ENCOUNTER — Telehealth (INDEPENDENT_AMBULATORY_CARE_PROVIDER_SITE_OTHER): Payer: Self-pay | Admitting: *Deleted

## 2010-07-16 NOTE — Medication Information (Signed)
Summary: rov/tm  Anticoagulant Therapy  Managed by: Weston Brass, PharmD Referring MD: Antoine Poche PCP: None Supervising MD: Tenny Craw MD, Gunnar Fusi Indication 1: Atrial Fibrillation Lab Used: [facility] [place] Site: [address] INR POC 2.0 INR RANGE 2.0-3.0  Dietary changes: no    Health status changes: no    Bleeding/hemorrhagic complications: no    Recent/future hospitalizations: no    Any changes in medication regimen? no    Recent/future dental: no  Any missed doses?: no       Is patient compliant with meds? yes       Allergies: No Known Drug Allergies  Anticoagulation Management History:      The patient is taking warfarin and comes in today for a routine follow up visit.  Positive risk factors for bleeding include an age of 70 years or older.  The bleeding index is 'intermediate risk'.  Positive CHADS2 values include History of CHF and History of HTN.  Negative CHADS2 values include Age > 39 years old.  Anticoagulation responsible provider: Tenny Craw MD, Gunnar Fusi.  INR POC: 2.0.  Cuvette Lot#: [id number].  Exp: 05/2011.    Anticoagulation Management Assessment/Plan:      The patient's current anticoagulation dose is Warfarin sodium 7.5 mg tabs: Use as directed by Anticoagulation Clinic.  The target INR is 2.0-3.0.  The next INR is due 07/20/2010.  Anticoagulation instructions were given to home health nurse.  Results were reviewed/authorized by Weston Brass, PharmD.  She was notified by Margot Chimes PharmD Candidate.         Prior Anticoagulation Instructions: INR 1.5   Take an extra 1/2 tablet today.  We changed your dose to 1 tablet everyday except on Tuesdays and Saturdays when you take 1 and 1/2 tablets.  Recheck INR in 10 days.   Current Anticoagulation Instructions: INR 2.0  Continue to take 1 tablet everyday except on Tuesdays and Saturdays when you take 1 1/2 tablets.  Recheck INR in 2 weeks.

## 2010-07-20 ENCOUNTER — Encounter: Payer: Self-pay | Admitting: Cardiology

## 2010-07-20 ENCOUNTER — Encounter (INDEPENDENT_AMBULATORY_CARE_PROVIDER_SITE_OTHER): Payer: Medicare Other

## 2010-07-20 DIAGNOSIS — I4891 Unspecified atrial fibrillation: Secondary | ICD-10-CM

## 2010-07-20 DIAGNOSIS — Z7901 Long term (current) use of anticoagulants: Secondary | ICD-10-CM

## 2010-07-20 LAB — BASIC METABOLIC PANEL
BUN: 28 mg/dL — ABNORMAL HIGH (ref 6–23)
BUN: 33 mg/dL — ABNORMAL HIGH (ref 6–23)
BUN: 35 mg/dL — ABNORMAL HIGH (ref 6–23)
BUN: 43 mg/dL — ABNORMAL HIGH (ref 6–23)
CO2: 25 mEq/L (ref 19–32)
CO2: 26 mEq/L (ref 19–32)
CO2: 27 mEq/L (ref 19–32)
CO2: 27 mEq/L (ref 19–32)
CO2: 28 mEq/L (ref 19–32)
CO2: 29 mEq/L (ref 19–32)
Calcium: 11.1 mg/dL — ABNORMAL HIGH (ref 8.4–10.5)
Calcium: 11.2 mg/dL — ABNORMAL HIGH (ref 8.4–10.5)
Calcium: 11.5 mg/dL — ABNORMAL HIGH (ref 8.4–10.5)
Chloride: 105 mEq/L (ref 96–112)
Chloride: 105 mEq/L (ref 96–112)
Chloride: 108 mEq/L (ref 96–112)
Chloride: 108 mEq/L (ref 96–112)
Chloride: 109 mEq/L (ref 96–112)
Creatinine, Ser: 1.81 mg/dL — ABNORMAL HIGH (ref 0.4–1.2)
Creatinine, Ser: 1.9 mg/dL — ABNORMAL HIGH (ref 0.4–1.2)
Creatinine, Ser: 2.1 mg/dL — ABNORMAL HIGH (ref 0.4–1.2)
Creatinine, Ser: 2.11 mg/dL — ABNORMAL HIGH (ref 0.4–1.2)
Creatinine, Ser: 2.16 mg/dL — ABNORMAL HIGH (ref 0.4–1.2)
Creatinine, Ser: 2.18 mg/dL — ABNORMAL HIGH (ref 0.4–1.2)
Creatinine, Ser: 2.22 mg/dL — ABNORMAL HIGH (ref 0.4–1.2)
GFR calc Af Amer: 25 mL/min — ABNORMAL LOW (ref >60)
GFR calc Af Amer: 27 mL/min — ABNORMAL LOW (ref >60)
GFR calc Af Amer: 27 mL/min — ABNORMAL LOW (ref >60)
GFR calc Af Amer: 28 mL/min — ABNORMAL LOW (ref >60)
GFR calc Af Amer: 28 mL/min — ABNORMAL LOW (ref >60)
GFR calc non Af Amer: 19 mL/min — ABNORMAL LOW (ref >60)
GFR calc non Af Amer: 22 mL/min — ABNORMAL LOW (ref >60)
GFR calc non Af Amer: 26 mL/min — ABNORMAL LOW (ref >60)
GFR calc non Af Amer: 28 mL/min — ABNORMAL LOW (ref >60)
Glucose, Bld: 105 mg/dL — ABNORMAL HIGH (ref 70–99)
Glucose, Bld: 169 mg/dL — ABNORMAL HIGH (ref 70–99)
Glucose, Bld: 86 mg/dL (ref 70–99)
Glucose, Bld: 89 mg/dL (ref 70–99)
Glucose, Bld: 99 mg/dL (ref 70–99)
Potassium: 3.8 mEq/L (ref 3.5–5.1)
Potassium: 3.9 mEq/L (ref 3.5–5.1)
Potassium: 3.9 mEq/L (ref 3.5–5.1)
Potassium: 4.1 mEq/L (ref 3.5–5.1)
Sodium: 135 mEq/L (ref 135–145)
Sodium: 136 mEq/L (ref 135–145)
Sodium: 137 mEq/L (ref 135–145)
Sodium: 138 mEq/L (ref 135–145)
Sodium: 142 mEq/L (ref 135–145)

## 2010-07-20 LAB — GLUCOSE, CAPILLARY
Glucose-Capillary: 101 mg/dL — ABNORMAL HIGH (ref 70–99)
Glucose-Capillary: 105 mg/dL — ABNORMAL HIGH (ref 70–99)
Glucose-Capillary: 108 mg/dL — ABNORMAL HIGH (ref 70–99)
Glucose-Capillary: 115 mg/dL — ABNORMAL HIGH (ref 70–99)
Glucose-Capillary: 121 mg/dL — ABNORMAL HIGH (ref 70–99)
Glucose-Capillary: 124 mg/dL — ABNORMAL HIGH (ref 70–99)
Glucose-Capillary: 129 mg/dL — ABNORMAL HIGH (ref 70–99)
Glucose-Capillary: 146 mg/dL — ABNORMAL HIGH (ref 70–99)
Glucose-Capillary: 161 mg/dL — ABNORMAL HIGH (ref 70–99)
Glucose-Capillary: 162 mg/dL — ABNORMAL HIGH (ref 70–99)
Glucose-Capillary: 77 mg/dL (ref 70–99)
Glucose-Capillary: 88 mg/dL (ref 70–99)
Glucose-Capillary: 88 mg/dL (ref 70–99)
Glucose-Capillary: 89 mg/dL (ref 70–99)
Glucose-Capillary: 97 mg/dL (ref 70–99)
Glucose-Capillary: 98 mg/dL (ref 70–99)

## 2010-07-20 LAB — CULTURE, BLOOD (ROUTINE X 2)
Culture  Setup Time: 201112022135
Culture: NO GROWTH

## 2010-07-20 LAB — BLOOD GAS, ARTERIAL
Acid-Base Excess: 3.1 mmol/L — ABNORMAL HIGH (ref 0.0–2.0)
Acid-Base Excess: 4.4 mmol/L — ABNORMAL HIGH (ref 0.0–2.0)
Bicarbonate: 27.5 mEq/L — ABNORMAL HIGH (ref 20.0–24.0)
Bicarbonate: 28.3 mEq/L — ABNORMAL HIGH (ref 20.0–24.0)
O2 Saturation: 95.2 %
Patient temperature: 98.6
TCO2: 28.9 mmol/L (ref 0–100)
TCO2: 29.5 mmol/L (ref 0–100)
pCO2 arterial: 45 mmHg (ref 35.0–45.0)
pO2, Arterial: 79.9 mmHg — ABNORMAL LOW (ref 80.0–100.0)

## 2010-07-20 LAB — PROCALCITONIN: Procalcitonin: 1.21 ng/mL

## 2010-07-20 LAB — CBC
HCT: 28.3 % — ABNORMAL LOW (ref 36.0–46.0)
Hemoglobin: 8.1 g/dL — ABNORMAL LOW (ref 12.0–15.0)
Hemoglobin: 8.2 g/dL — ABNORMAL LOW (ref 12.0–15.0)
Hemoglobin: 8.5 g/dL — ABNORMAL LOW (ref 12.0–15.0)
Hemoglobin: 8.7 g/dL — ABNORMAL LOW (ref 12.0–15.0)
MCH: 25.2 pg — ABNORMAL LOW (ref 26.0–34.0)
MCH: 25.6 pg — ABNORMAL LOW (ref 26.0–34.0)
MCH: 25.7 pg — ABNORMAL LOW (ref 26.0–34.0)
MCHC: 31.1 g/dL (ref 30.0–36.0)
MCHC: 31.3 g/dL (ref 30.0–36.0)
MCHC: 31.4 g/dL (ref 30.0–36.0)
MCV: 80.2 fL (ref 78.0–100.0)
MCV: 81.4 fL (ref 78.0–100.0)
Platelets: 380 10*3/uL (ref 150–400)
Platelets: 382 10*3/uL (ref 150–400)
Platelets: 409 10*3/uL — ABNORMAL HIGH (ref 150–400)
RBC: 3.22 MIL/uL — ABNORMAL LOW (ref 3.87–5.11)
RBC: 3.39 MIL/uL — ABNORMAL LOW (ref 3.87–5.11)
RDW: 15.2 % (ref 11.5–15.5)
RDW: 15.7 % — ABNORMAL HIGH (ref 11.5–15.5)
RDW: 15.8 % — ABNORMAL HIGH (ref 11.5–15.5)
WBC: 10.8 10*3/uL — ABNORMAL HIGH (ref 4.0–10.5)
WBC: 12.1 10*3/uL — ABNORMAL HIGH (ref 4.0–10.5)

## 2010-07-20 LAB — PROTIME-INR
INR: 1.52 — ABNORMAL HIGH (ref 0.00–1.49)
INR: 1.92 — ABNORMAL HIGH (ref 0.00–1.49)
INR: 1.94 — ABNORMAL HIGH (ref 0.00–1.49)
INR: 2.09 — ABNORMAL HIGH (ref 0.00–1.49)
INR: 2.55 — ABNORMAL HIGH (ref 0.00–1.49)
INR: 3.08 — ABNORMAL HIGH (ref 0.00–1.49)
Prothrombin Time: 18.1 seconds — ABNORMAL HIGH (ref 11.6–15.2)
Prothrombin Time: 18.5 seconds — ABNORMAL HIGH (ref 11.6–15.2)
Prothrombin Time: 22.1 seconds — ABNORMAL HIGH (ref 11.6–15.2)
Prothrombin Time: 22.3 seconds — ABNORMAL HIGH (ref 11.6–15.2)
Prothrombin Time: 27.5 seconds — ABNORMAL HIGH (ref 11.6–15.2)

## 2010-07-20 LAB — VANCOMYCIN, RANDOM: Vancomycin Rm: 20.8 ug/mL

## 2010-07-21 LAB — BASIC METABOLIC PANEL
BUN: 13 mg/dL (ref 6–23)
BUN: 18 mg/dL (ref 6–23)
BUN: 19 mg/dL (ref 6–23)
BUN: 19 mg/dL (ref 6–23)
BUN: 20 mg/dL (ref 6–23)
BUN: 23 mg/dL (ref 6–23)
BUN: 24 mg/dL — ABNORMAL HIGH (ref 6–23)
BUN: 26 mg/dL — ABNORMAL HIGH (ref 6–23)
CO2: 19 mEq/L (ref 19–32)
CO2: 20 mEq/L (ref 19–32)
CO2: 20 mEq/L (ref 19–32)
CO2: 21 mEq/L (ref 19–32)
CO2: 22 mEq/L (ref 19–32)
CO2: 23 mEq/L (ref 19–32)
CO2: 24 mEq/L (ref 19–32)
CO2: 26 mEq/L (ref 19–32)
CO2: 27 mEq/L (ref 19–32)
CO2: 29 mEq/L (ref 19–32)
Calcium: 10.2 mg/dL (ref 8.4–10.5)
Calcium: 10.6 mg/dL — ABNORMAL HIGH (ref 8.4–10.5)
Calcium: 10.7 mg/dL — ABNORMAL HIGH (ref 8.4–10.5)
Calcium: 11.2 mg/dL — ABNORMAL HIGH (ref 8.4–10.5)
Calcium: 11.4 mg/dL — ABNORMAL HIGH (ref 8.4–10.5)
Calcium: 9.9 mg/dL (ref 8.4–10.5)
Calcium: 9.9 mg/dL (ref 8.4–10.5)
Calcium: 9.9 mg/dL (ref 8.4–10.5)
Chloride: 105 mEq/L (ref 96–112)
Chloride: 108 mEq/L (ref 96–112)
Chloride: 108 mEq/L (ref 96–112)
Chloride: 109 mEq/L (ref 96–112)
Chloride: 111 mEq/L (ref 96–112)
Chloride: 111 mEq/L (ref 96–112)
Chloride: 112 mEq/L (ref 96–112)
Chloride: 114 mEq/L — ABNORMAL HIGH (ref 96–112)
Creatinine, Ser: 1.39 mg/dL — ABNORMAL HIGH (ref 0.4–1.2)
Creatinine, Ser: 1.48 mg/dL — ABNORMAL HIGH (ref 0.4–1.2)
Creatinine, Ser: 1.53 mg/dL — ABNORMAL HIGH (ref 0.4–1.2)
Creatinine, Ser: 1.57 mg/dL — ABNORMAL HIGH (ref 0.4–1.2)
Creatinine, Ser: 1.79 mg/dL — ABNORMAL HIGH (ref 0.4–1.2)
Creatinine, Ser: 1.87 mg/dL — ABNORMAL HIGH (ref 0.4–1.2)
Creatinine, Ser: 2.03 mg/dL — ABNORMAL HIGH (ref 0.4–1.2)
Creatinine, Ser: 2.23 mg/dL — ABNORMAL HIGH (ref 0.4–1.2)
GFR calc Af Amer: 26 mL/min — ABNORMAL LOW (ref >60)
GFR calc Af Amer: 28 mL/min — ABNORMAL LOW (ref >60)
GFR calc Af Amer: 29 mL/min — ABNORMAL LOW (ref >60)
GFR calc Af Amer: 34 mL/min — ABNORMAL LOW (ref >60)
GFR calc Af Amer: 38 mL/min — ABNORMAL LOW (ref >60)
GFR calc Af Amer: 40 mL/min — ABNORMAL LOW (ref >60)
GFR calc Af Amer: 41 mL/min — ABNORMAL LOW (ref >60)
GFR calc Af Amer: 42 mL/min — ABNORMAL LOW (ref >60)
GFR calc Af Amer: 45 mL/min — ABNORMAL LOW (ref >60)
GFR calc non Af Amer: 22 mL/min — ABNORMAL LOW (ref >60)
GFR calc non Af Amer: 23 mL/min — ABNORMAL LOW (ref >60)
GFR calc non Af Amer: 24 mL/min — ABNORMAL LOW (ref >60)
GFR calc non Af Amer: 25 mL/min — ABNORMAL LOW (ref >60)
GFR calc non Af Amer: 28 mL/min — ABNORMAL LOW (ref >60)
GFR calc non Af Amer: 32 mL/min — ABNORMAL LOW (ref >60)
GFR calc non Af Amer: 33 mL/min — ABNORMAL LOW (ref >60)
GFR calc non Af Amer: 34 mL/min — ABNORMAL LOW (ref >60)
GFR calc non Af Amer: 38 mL/min — ABNORMAL LOW (ref >60)
Glucose, Bld: 101 mg/dL — ABNORMAL HIGH (ref 70–99)
Glucose, Bld: 106 mg/dL — ABNORMAL HIGH (ref 70–99)
Glucose, Bld: 123 mg/dL — ABNORMAL HIGH (ref 70–99)
Glucose, Bld: 135 mg/dL — ABNORMAL HIGH (ref 70–99)
Glucose, Bld: 140 mg/dL — ABNORMAL HIGH (ref 70–99)
Glucose, Bld: 145 mg/dL — ABNORMAL HIGH (ref 70–99)
Glucose, Bld: 145 mg/dL — ABNORMAL HIGH (ref 70–99)
Glucose, Bld: 153 mg/dL — ABNORMAL HIGH (ref 70–99)
Glucose, Bld: 177 mg/dL — ABNORMAL HIGH (ref 70–99)
Potassium: 3.5 mEq/L (ref 3.5–5.1)
Potassium: 3.6 mEq/L (ref 3.5–5.1)
Potassium: 3.7 mEq/L (ref 3.5–5.1)
Potassium: 3.7 mEq/L (ref 3.5–5.1)
Potassium: 3.9 mEq/L (ref 3.5–5.1)
Potassium: 4 mEq/L (ref 3.5–5.1)
Potassium: 4 mEq/L (ref 3.5–5.1)
Potassium: 4.1 mEq/L (ref 3.5–5.1)
Potassium: 4.1 mEq/L (ref 3.5–5.1)
Potassium: 4.3 mEq/L (ref 3.5–5.1)
Sodium: 135 mEq/L (ref 135–145)
Sodium: 137 mEq/L (ref 135–145)
Sodium: 137 mEq/L (ref 135–145)
Sodium: 138 mEq/L (ref 135–145)
Sodium: 138 mEq/L (ref 135–145)
Sodium: 138 mEq/L (ref 135–145)
Sodium: 139 mEq/L (ref 135–145)
Sodium: 142 mEq/L (ref 135–145)
Sodium: 142 mEq/L (ref 135–145)
Sodium: 142 mEq/L (ref 135–145)

## 2010-07-21 LAB — CBC
HCT: 26.5 % — ABNORMAL LOW (ref 36.0–46.0)
HCT: 26.9 % — ABNORMAL LOW (ref 36.0–46.0)
HCT: 26.9 % — ABNORMAL LOW (ref 36.0–46.0)
HCT: 26.9 % — ABNORMAL LOW (ref 36.0–46.0)
HCT: 27.2 % — ABNORMAL LOW (ref 36.0–46.0)
HCT: 27.2 % — ABNORMAL LOW (ref 36.0–46.0)
HCT: 27.4 % — ABNORMAL LOW (ref 36.0–46.0)
HCT: 27.4 % — ABNORMAL LOW (ref 36.0–46.0)
HCT: 27.4 % — ABNORMAL LOW (ref 36.0–46.0)
HCT: 32.3 % — ABNORMAL LOW (ref 36.0–46.0)
HCT: 32.4 % — ABNORMAL LOW (ref 36.0–46.0)
HCT: 33.6 % — ABNORMAL LOW (ref 36.0–46.0)
Hemoglobin: 10.3 g/dL — ABNORMAL LOW (ref 12.0–15.0)
Hemoglobin: 10.3 g/dL — ABNORMAL LOW (ref 12.0–15.0)
Hemoglobin: 10.8 g/dL — ABNORMAL LOW (ref 12.0–15.0)
Hemoglobin: 11.7 g/dL — ABNORMAL LOW (ref 12.0–15.0)
Hemoglobin: 8.5 g/dL — ABNORMAL LOW (ref 12.0–15.0)
Hemoglobin: 8.5 g/dL — ABNORMAL LOW (ref 12.0–15.0)
Hemoglobin: 8.5 g/dL — ABNORMAL LOW (ref 12.0–15.0)
Hemoglobin: 8.6 g/dL — ABNORMAL LOW (ref 12.0–15.0)
Hemoglobin: 8.6 g/dL — ABNORMAL LOW (ref 12.0–15.0)
Hemoglobin: 8.7 g/dL — ABNORMAL LOW (ref 12.0–15.0)
Hemoglobin: 8.8 g/dL — ABNORMAL LOW (ref 12.0–15.0)
Hemoglobin: 8.9 g/dL — ABNORMAL LOW (ref 12.0–15.0)
MCH: 25.2 pg — ABNORMAL LOW (ref 26.0–34.0)
MCH: 25.2 pg — ABNORMAL LOW (ref 26.0–34.0)
MCH: 25.3 pg — ABNORMAL LOW (ref 26.0–34.0)
MCH: 25.4 pg — ABNORMAL LOW (ref 26.0–34.0)
MCH: 25.5 pg — ABNORMAL LOW (ref 26.0–34.0)
MCH: 25.5 pg — ABNORMAL LOW (ref 26.0–34.0)
MCH: 25.7 pg — ABNORMAL LOW (ref 26.0–34.0)
MCH: 25.8 pg — ABNORMAL LOW (ref 26.0–34.0)
MCH: 25.8 pg — ABNORMAL LOW (ref 26.0–34.0)
MCH: 25.8 pg — ABNORMAL LOW (ref 26.0–34.0)
MCH: 25.9 pg — ABNORMAL LOW (ref 26.0–34.0)
MCH: 26.2 pg (ref 26.0–34.0)
MCHC: 30.4 g/dL (ref 30.0–36.0)
MCHC: 31.4 g/dL (ref 30.0–36.0)
MCHC: 31.6 g/dL (ref 30.0–36.0)
MCHC: 31.6 g/dL (ref 30.0–36.0)
MCHC: 31.8 g/dL (ref 30.0–36.0)
MCHC: 31.9 g/dL (ref 30.0–36.0)
MCHC: 32 g/dL (ref 30.0–36.0)
MCHC: 32.1 g/dL (ref 30.0–36.0)
MCHC: 32.1 g/dL (ref 30.0–36.0)
MCHC: 32.1 g/dL (ref 30.0–36.0)
MCHC: 32.7 g/dL (ref 30.0–36.0)
MCV: 79.1 fL (ref 78.0–100.0)
MCV: 79.5 fL (ref 78.0–100.0)
MCV: 79.7 fL (ref 78.0–100.0)
MCV: 80 fL (ref 78.0–100.0)
MCV: 80.2 fL (ref 78.0–100.0)
MCV: 80.2 fL (ref 78.0–100.0)
MCV: 80.4 fL (ref 78.0–100.0)
MCV: 80.5 fL (ref 78.0–100.0)
MCV: 80.5 fL (ref 78.0–100.0)
MCV: 80.8 fL (ref 78.0–100.0)
MCV: 82.9 fL (ref 78.0–100.0)
Platelets: 153 10*3/uL (ref 150–400)
Platelets: 184 10*3/uL (ref 150–400)
Platelets: 188 10*3/uL (ref 150–400)
Platelets: 207 10*3/uL (ref 150–400)
Platelets: 210 10*3/uL (ref 150–400)
Platelets: 225 10*3/uL (ref 150–400)
Platelets: 242 10*3/uL (ref 150–400)
Platelets: 242 10*3/uL (ref 150–400)
Platelets: 251 10*3/uL (ref 150–400)
Platelets: 279 10*3/uL (ref 150–400)
Platelets: 329 10*3/uL (ref 150–400)
RBC: 3.29 MIL/uL — ABNORMAL LOW (ref 3.87–5.11)
RBC: 3.33 MIL/uL — ABNORMAL LOW (ref 3.87–5.11)
RBC: 3.34 MIL/uL — ABNORMAL LOW (ref 3.87–5.11)
RBC: 3.39 MIL/uL — ABNORMAL LOW (ref 3.87–5.11)
RBC: 3.4 MIL/uL — ABNORMAL LOW (ref 3.87–5.11)
RBC: 3.41 MIL/uL — ABNORMAL LOW (ref 3.87–5.11)
RBC: 4.01 MIL/uL (ref 3.87–5.11)
RBC: 4.04 MIL/uL (ref 3.87–5.11)
RBC: 4.19 MIL/uL (ref 3.87–5.11)
RBC: 4.53 MIL/uL (ref 3.87–5.11)
RDW: 16.1 % — ABNORMAL HIGH (ref 11.5–15.5)
RDW: 16.1 % — ABNORMAL HIGH (ref 11.5–15.5)
RDW: 16.2 % — ABNORMAL HIGH (ref 11.5–15.5)
RDW: 16.2 % — ABNORMAL HIGH (ref 11.5–15.5)
RDW: 16.2 % — ABNORMAL HIGH (ref 11.5–15.5)
RDW: 16.3 % — ABNORMAL HIGH (ref 11.5–15.5)
RDW: 16.7 % — ABNORMAL HIGH (ref 11.5–15.5)
RDW: 16.8 % — ABNORMAL HIGH (ref 11.5–15.5)
RDW: 16.9 % — ABNORMAL HIGH (ref 11.5–15.5)
RDW: 16.9 % — ABNORMAL HIGH (ref 11.5–15.5)
RDW: 16.9 % — ABNORMAL HIGH (ref 11.5–15.5)
WBC: 10.3 10*3/uL (ref 4.0–10.5)
WBC: 10.6 10*3/uL — ABNORMAL HIGH (ref 4.0–10.5)
WBC: 10.6 10*3/uL — ABNORMAL HIGH (ref 4.0–10.5)
WBC: 10.7 10*3/uL — ABNORMAL HIGH (ref 4.0–10.5)
WBC: 10.9 10*3/uL — ABNORMAL HIGH (ref 4.0–10.5)
WBC: 13 10*3/uL — ABNORMAL HIGH (ref 4.0–10.5)
WBC: 8.2 10*3/uL (ref 4.0–10.5)
WBC: 8.6 10*3/uL (ref 4.0–10.5)
WBC: 9.6 10*3/uL (ref 4.0–10.5)

## 2010-07-21 LAB — PROTIME-INR
INR: 0.97 (ref 0.00–1.49)
INR: 1.06 (ref 0.00–1.49)
INR: 1.11 (ref 0.00–1.49)
INR: 1.13 (ref 0.00–1.49)
INR: 1.32 (ref 0.00–1.49)
INR: 1.51 — ABNORMAL HIGH (ref 0.00–1.49)
INR: 1.61 — ABNORMAL HIGH (ref 0.00–1.49)
INR: 1.61 — ABNORMAL HIGH (ref 0.00–1.49)
INR: 2.01 — ABNORMAL HIGH (ref 0.00–1.49)
INR: 2.75 — ABNORMAL HIGH (ref 0.00–1.49)
INR: 2.79 — ABNORMAL HIGH (ref 0.00–1.49)
Prothrombin Time: 13.1 seconds (ref 11.6–15.2)
Prothrombin Time: 14 seconds (ref 11.6–15.2)
Prothrombin Time: 14.5 seconds (ref 11.6–15.2)
Prothrombin Time: 14.7 seconds (ref 11.6–15.2)
Prothrombin Time: 16.6 seconds — ABNORMAL HIGH (ref 11.6–15.2)
Prothrombin Time: 17.2 seconds — ABNORMAL HIGH (ref 11.6–15.2)
Prothrombin Time: 18.4 seconds — ABNORMAL HIGH (ref 11.6–15.2)
Prothrombin Time: 19.3 seconds — ABNORMAL HIGH (ref 11.6–15.2)
Prothrombin Time: 19.3 seconds — ABNORMAL HIGH (ref 11.6–15.2)
Prothrombin Time: 22.9 seconds — ABNORMAL HIGH (ref 11.6–15.2)
Prothrombin Time: 29.2 seconds — ABNORMAL HIGH (ref 11.6–15.2)
Prothrombin Time: 29.5 seconds — ABNORMAL HIGH (ref 11.6–15.2)

## 2010-07-21 LAB — GLUCOSE, CAPILLARY
Glucose-Capillary: 100 mg/dL — ABNORMAL HIGH (ref 70–99)
Glucose-Capillary: 103 mg/dL — ABNORMAL HIGH (ref 70–99)
Glucose-Capillary: 104 mg/dL — ABNORMAL HIGH (ref 70–99)
Glucose-Capillary: 105 mg/dL — ABNORMAL HIGH (ref 70–99)
Glucose-Capillary: 105 mg/dL — ABNORMAL HIGH (ref 70–99)
Glucose-Capillary: 111 mg/dL — ABNORMAL HIGH (ref 70–99)
Glucose-Capillary: 117 mg/dL — ABNORMAL HIGH (ref 70–99)
Glucose-Capillary: 117 mg/dL — ABNORMAL HIGH (ref 70–99)
Glucose-Capillary: 117 mg/dL — ABNORMAL HIGH (ref 70–99)
Glucose-Capillary: 118 mg/dL — ABNORMAL HIGH (ref 70–99)
Glucose-Capillary: 121 mg/dL — ABNORMAL HIGH (ref 70–99)
Glucose-Capillary: 123 mg/dL — ABNORMAL HIGH (ref 70–99)
Glucose-Capillary: 128 mg/dL — ABNORMAL HIGH (ref 70–99)
Glucose-Capillary: 136 mg/dL — ABNORMAL HIGH (ref 70–99)
Glucose-Capillary: 138 mg/dL — ABNORMAL HIGH (ref 70–99)
Glucose-Capillary: 141 mg/dL — ABNORMAL HIGH (ref 70–99)
Glucose-Capillary: 142 mg/dL — ABNORMAL HIGH (ref 70–99)
Glucose-Capillary: 143 mg/dL — ABNORMAL HIGH (ref 70–99)
Glucose-Capillary: 150 mg/dL — ABNORMAL HIGH (ref 70–99)
Glucose-Capillary: 151 mg/dL — ABNORMAL HIGH (ref 70–99)
Glucose-Capillary: 160 mg/dL — ABNORMAL HIGH (ref 70–99)
Glucose-Capillary: 160 mg/dL — ABNORMAL HIGH (ref 70–99)
Glucose-Capillary: 168 mg/dL — ABNORMAL HIGH (ref 70–99)
Glucose-Capillary: 169 mg/dL — ABNORMAL HIGH (ref 70–99)
Glucose-Capillary: 199 mg/dL — ABNORMAL HIGH (ref 70–99)
Glucose-Capillary: 94 mg/dL (ref 70–99)
Glucose-Capillary: 98 mg/dL (ref 70–99)

## 2010-07-21 LAB — URINE CULTURE
Colony Count: NO GROWTH
Culture: NO GROWTH

## 2010-07-21 LAB — URINE MICROSCOPIC-ADD ON

## 2010-07-21 LAB — LACTIC ACID, PLASMA: Lactic Acid, Venous: 1 mmol/L (ref 0.5–2.2)

## 2010-07-21 LAB — HEPARIN LEVEL (UNFRACTIONATED)
Heparin Unfractionated: 0.15 IU/mL — ABNORMAL LOW (ref 0.30–0.70)
Heparin Unfractionated: 0.31 IU/mL (ref 0.30–0.70)
Heparin Unfractionated: 0.32 IU/mL (ref 0.30–0.70)
Heparin Unfractionated: 0.34 IU/mL (ref 0.30–0.70)
Heparin Unfractionated: 0.37 IU/mL (ref 0.30–0.70)
Heparin Unfractionated: 0.39 IU/mL (ref 0.30–0.70)
Heparin Unfractionated: 0.45 IU/mL (ref 0.30–0.70)
Heparin Unfractionated: <0.1 IU/mL — ABNORMAL LOW (ref 0.30–0.70)

## 2010-07-21 LAB — BLOOD GAS, ARTERIAL
Acid-Base Excess: 3.8 mmol/L — ABNORMAL HIGH (ref 0.0–2.0)
Acid-base deficit: 0.7 mmol/L (ref 0.0–2.0)
Acid-base deficit: 2.3 mmol/L — ABNORMAL HIGH (ref 0.0–2.0)
Acid-base deficit: 2.3 mmol/L — ABNORMAL HIGH (ref 0.0–2.0)
Acid-base deficit: 2.4 mmol/L — ABNORMAL HIGH (ref 0.0–2.0)
Acid-base deficit: 4.3 mmol/L — ABNORMAL HIGH (ref 0.0–2.0)
Acid-base deficit: 5.7 mmol/L — ABNORMAL HIGH (ref 0.0–2.0)
Bicarbonate: 19.8 mEq/L — ABNORMAL LOW (ref 20.0–24.0)
Bicarbonate: 20.7 mEq/L (ref 20.0–24.0)
Bicarbonate: 22.1 mEq/L (ref 20.0–24.0)
Bicarbonate: 22.7 mEq/L (ref 20.0–24.0)
Bicarbonate: 25.3 mEq/L — ABNORMAL HIGH (ref 20.0–24.0)
Drawn by: 31843
FIO2: 0.3 %
FIO2: 0.3 %
FIO2: 0.3 %
FIO2: 0.3 %
FIO2: 0.3 %
FIO2: 0.4 %
FIO2: 0.4 %
MECHVT: 400 mL
MECHVT: 450 mL
MECHVT: 450 mL
MECHVT: 450 mL
MECHVT: 450 mL
MECHVT: 450 mL
MECHVT: 450 mL
MECHVT: 450 mL
O2 Saturation: 96 %
O2 Saturation: 96.7 %
O2 Saturation: 97 %
O2 Saturation: 97.6 %
O2 Saturation: 97.7 %
O2 Saturation: 98.8 %
O2 Saturation: 99.6 %
PEEP: 5 cmH2O
PEEP: 5 cmH2O
Patient temperature: 97.6
Patient temperature: 98.6
Patient temperature: 98.6
Patient temperature: 98.6
Patient temperature: 98.6
Patient temperature: 98.6
RATE: 16 resp/min
RATE: 16 resp/min
TCO2: 20.3 mmol/L (ref 0–100)
TCO2: 20.9 mmol/L (ref 0–100)
TCO2: 21.9 mmol/L (ref 0–100)
TCO2: 22.9 mmol/L (ref 0–100)
TCO2: 23.6 mmol/L (ref 0–100)
TCO2: 24 mmol/L (ref 0–100)
TCO2: 24.6 mmol/L (ref 0–100)
TCO2: 26.6 mmol/L (ref 0–100)
TCO2: 29.9 mmol/L (ref 0–100)
pCO2 arterial: 35.8 mmHg (ref 35.0–45.0)
pCO2 arterial: 36.8 mmHg (ref 35.0–45.0)
pCO2 arterial: 37.8 mmHg (ref 35.0–45.0)
pCO2 arterial: 38.7 mmHg (ref 35.0–45.0)
pCO2 arterial: 41.6 mmHg (ref 35.0–45.0)
pH, Arterial: 7.267 — ABNORMAL LOW (ref 7.350–7.400)
pH, Arterial: 7.344 — ABNORMAL LOW (ref 7.350–7.400)
pH, Arterial: 7.361 (ref 7.350–7.400)
pH, Arterial: 7.383 (ref 7.350–7.400)
pH, Arterial: 7.395 (ref 7.350–7.400)
pH, Arterial: 7.415 — ABNORMAL HIGH (ref 7.350–7.400)
pO2, Arterial: 138 mmHg — ABNORMAL HIGH (ref 80.0–100.0)
pO2, Arterial: 146 mmHg — ABNORMAL HIGH (ref 80.0–100.0)
pO2, Arterial: 85.5 mmHg (ref 80.0–100.0)
pO2, Arterial: 86.8 mmHg (ref 80.0–100.0)
pO2, Arterial: 87.2 mmHg (ref 80.0–100.0)

## 2010-07-21 LAB — MAGNESIUM
Magnesium: 1.9 mg/dL (ref 1.5–2.5)
Magnesium: 1.9 mg/dL (ref 1.5–2.5)
Magnesium: 1.9 mg/dL (ref 1.5–2.5)
Magnesium: 2 mg/dL (ref 1.5–2.5)
Magnesium: 2.3 mg/dL (ref 1.5–2.5)

## 2010-07-21 LAB — T3: T3, Total: 68 ng/dl — ABNORMAL LOW (ref 80.0–204.0)

## 2010-07-21 LAB — DIFFERENTIAL
Basophils Relative: 0 % (ref 0–1)
Basophils Relative: 0 % (ref 0–1)
Eosinophils Absolute: 0.1 10*3/uL (ref 0.0–0.7)
Eosinophils Absolute: 0.3 10*3/uL (ref 0.0–0.7)
Neutrophils Relative %: 67 % (ref 43–77)
Neutrophils Relative %: 82 % — ABNORMAL HIGH (ref 43–77)

## 2010-07-21 LAB — URINALYSIS, MICROSCOPIC ONLY
Nitrite: NEGATIVE
Specific Gravity, Urine: 1.009 (ref 1.005–1.030)
Urobilinogen, UA: 0.2 mg/dL (ref 0.0–1.0)

## 2010-07-21 LAB — CARDIAC PANEL(CRET KIN+CKTOT+MB+TROPI)
CK, MB: 4.5 ng/mL — ABNORMAL HIGH (ref 0.3–4.0)
CK, MB: 5.3 ng/mL — ABNORMAL HIGH (ref 0.3–4.0)
CK, MB: 5.7 ng/mL — ABNORMAL HIGH (ref 0.3–4.0)
CK, MB: 7.4 ng/mL (ref 0.3–4.0)
CK, MB: 7.6 ng/mL (ref 0.3–4.0)
Relative Index: 0.1 (ref 0.0–2.5)
Relative Index: 0.2 (ref 0.0–2.5)
Relative Index: 0.3 (ref 0.0–2.5)
Relative Index: 3.1 — ABNORMAL HIGH (ref 0.0–2.5)
Relative Index: 3.6 — ABNORMAL HIGH (ref 0.0–2.5)
Total CK: 211 U/L — ABNORMAL HIGH (ref 7–177)
Total CK: 222 U/L — ABNORMAL HIGH (ref 7–177)
Total CK: 236 U/L — ABNORMAL HIGH (ref 7–177)
Total CK: 2555 U/L — ABNORMAL HIGH (ref 7–177)
Troponin I: 0.11 ng/mL — ABNORMAL HIGH (ref 0.00–0.06)
Troponin I: 0.19 ng/mL — ABNORMAL HIGH (ref 0.00–0.06)
Troponin I: 0.19 ng/mL — ABNORMAL HIGH (ref 0.00–0.06)
Troponin I: 0.33 ng/mL — ABNORMAL HIGH (ref 0.00–0.06)

## 2010-07-21 LAB — URINALYSIS, ROUTINE W REFLEX MICROSCOPIC
Bilirubin Urine: NEGATIVE
Glucose, UA: NEGATIVE mg/dL
Nitrite: NEGATIVE
Protein, ur: 100 mg/dL — AB
Protein, ur: NEGATIVE mg/dL
Specific Gravity, Urine: 1.014 (ref 1.005–1.030)
Specific Gravity, Urine: 1.021 (ref 1.005–1.030)
Urobilinogen, UA: 0.2 mg/dL (ref 0.0–1.0)
Urobilinogen, UA: 1 mg/dL (ref 0.0–1.0)

## 2010-07-21 LAB — COMPREHENSIVE METABOLIC PANEL
ALT: 28 U/L (ref 0–35)
Albumin: 2.7 g/dL — ABNORMAL LOW (ref 3.5–5.2)
Albumin: 3 g/dL — ABNORMAL LOW (ref 3.5–5.2)
Alkaline Phosphatase: 109 U/L (ref 39–117)
BUN: 14 mg/dL (ref 6–23)
BUN: 25 mg/dL — ABNORMAL HIGH (ref 6–23)
CO2: 23 mEq/L (ref 19–32)
Calcium: 10.2 mg/dL (ref 8.4–10.5)
Calcium: 9.5 mg/dL (ref 8.4–10.5)
Calcium: 9.7 mg/dL (ref 8.4–10.5)
Chloride: 113 mEq/L — ABNORMAL HIGH (ref 96–112)
Creatinine, Ser: 1.89 mg/dL — ABNORMAL HIGH (ref 0.4–1.2)
Creatinine, Ser: 2.3 mg/dL — ABNORMAL HIGH (ref 0.4–1.2)
GFR calc Af Amer: 25 mL/min — ABNORMAL LOW (ref >60)
GFR calc non Af Amer: 20 mL/min — ABNORMAL LOW (ref >60)
Glucose, Bld: 147 mg/dL — ABNORMAL HIGH (ref 70–99)
Potassium: 3.6 mEq/L (ref 3.5–5.1)
Sodium: 137 mEq/L (ref 135–145)
Total Bilirubin: 0.3 mg/dL (ref 0.3–1.2)
Total Bilirubin: 0.5 mg/dL (ref 0.3–1.2)
Total Protein: 5.9 g/dL — ABNORMAL LOW (ref 6.0–8.3)
Total Protein: 6.3 g/dL (ref 6.0–8.3)

## 2010-07-21 LAB — PHOSPHORUS
Phosphorus: 2.5 mg/dL (ref 2.3–4.6)
Phosphorus: 2.9 mg/dL (ref 2.3–4.6)
Phosphorus: 3.1 mg/dL (ref 2.3–4.6)
Phosphorus: 3.2 mg/dL (ref 2.3–4.6)
Phosphorus: 3.6 mg/dL (ref 2.3–4.6)
Phosphorus: 4.2 mg/dL (ref 2.3–4.6)
Phosphorus: 5.6 mg/dL — ABNORMAL HIGH (ref 2.3–4.6)

## 2010-07-21 LAB — CULTURE, BAL-QUANTITATIVE W GRAM STAIN

## 2010-07-21 LAB — LIPID PANEL
Cholesterol: 180 mg/dL (ref 0–200)
HDL: 42 mg/dL (ref >39)
LDL Cholesterol: 122 mg/dL — ABNORMAL HIGH (ref 0–99)
Total CHOL/HDL Ratio: 4.3 RATIO
Triglycerides: 79 mg/dL (ref <150)
VLDL: 16 mg/dL (ref 0–40)

## 2010-07-21 LAB — CULTURE, BLOOD (ROUTINE X 2)
Culture  Setup Time: 201111230859
Culture: NO GROWTH
Culture: NO GROWTH

## 2010-07-21 LAB — D-DIMER, QUANTITATIVE: D-Dimer, Quant: 2.39 ug/mL-FEU — ABNORMAL HIGH (ref 0.00–0.48)

## 2010-07-21 LAB — VANCOMYCIN, TROUGH: Vancomycin Tr: 15.4 ug/mL (ref 10.0–20.0)

## 2010-07-21 LAB — CK TOTAL AND CKMB (NOT AT ARMC)
Relative Index: 2.2 (ref 0.0–2.5)
Total CK: 331 U/L — ABNORMAL HIGH (ref 7–177)

## 2010-07-21 LAB — CARBOXYHEMOGLOBIN
O2 Saturation: 72.2 %
Total hemoglobin: 10 g/dL — ABNORMAL LOW (ref 12.5–16.0)

## 2010-07-21 LAB — BRAIN NATRIURETIC PEPTIDE: Pro B Natriuretic peptide (BNP): 497 pg/mL — ABNORMAL HIGH (ref 0.0–100.0)

## 2010-07-21 LAB — ABO/RH: ABO/RH(D): A POS

## 2010-07-21 LAB — MRSA PCR SCREENING: MRSA by PCR: NEGATIVE

## 2010-07-21 LAB — APTT: aPTT: 43 seconds — ABNORMAL HIGH (ref 24–37)

## 2010-07-21 LAB — PROCALCITONIN: Procalcitonin: 33.91 ng/mL

## 2010-07-21 NOTE — Progress Notes (Signed)
   Walk in Patient Form Recieved " Pt Dropped off [facility] papers to be Signed by Physician" sent to [facility]  July 16, 2010 11:27 AM     Appended Document:  I just received this paperwork today it was placed in paperwork and not given to me

## 2010-07-22 ENCOUNTER — Inpatient Hospital Stay (HOSPITAL_COMMUNITY)
Admission: EM | Admit: 2010-07-22 | Discharge: 2010-07-24 | DRG: 313 | Disposition: A | Discharge status: Home / Self Care | Payer: Medicare Other | Attending: Internal Medicine | Admitting: Internal Medicine

## 2010-07-22 ENCOUNTER — Emergency Department (HOSPITAL_COMMUNITY): Payer: Medicare Other

## 2010-07-22 DIAGNOSIS — F17200 Nicotine dependence, unspecified, uncomplicated: Secondary | ICD-10-CM | POA: Diagnosis present

## 2010-07-22 DIAGNOSIS — R0789 Other chest pain: Principal | ICD-10-CM | POA: Diagnosis present

## 2010-07-22 DIAGNOSIS — N183 Chronic kidney disease, stage 3 unspecified: Secondary | ICD-10-CM | POA: Diagnosis present

## 2010-07-22 DIAGNOSIS — I4891 Unspecified atrial fibrillation: Secondary | ICD-10-CM | POA: Diagnosis present

## 2010-07-22 DIAGNOSIS — D649 Anemia, unspecified: Secondary | ICD-10-CM | POA: Diagnosis present

## 2010-07-22 DIAGNOSIS — Z7901 Long term (current) use of anticoagulants: Secondary | ICD-10-CM

## 2010-07-22 DIAGNOSIS — I252 Old myocardial infarction: Secondary | ICD-10-CM

## 2010-07-22 DIAGNOSIS — Z86718 Personal history of other venous thrombosis and embolism: Secondary | ICD-10-CM

## 2010-07-22 LAB — DIFFERENTIAL
Basophils Absolute: 0 10*3/uL (ref 0.0–0.1)
Lymphocytes Relative: 30 % (ref 12–46)
Lymphs Abs: 2.3 10*3/uL (ref 0.7–4.0)
Monocytes Absolute: 0.5 10*3/uL (ref 0.1–1.0)
Monocytes Relative: 7 % (ref 3–12)
Neutro Abs: 4.5 10*3/uL (ref 1.7–7.7)

## 2010-07-22 LAB — COMPREHENSIVE METABOLIC PANEL
ALT: 14 U/L (ref 0–35)
AST: 18 U/L (ref 0–37)
CO2: 25 mEq/L (ref 19–32)
Calcium: 11 mg/dL — ABNORMAL HIGH (ref 8.4–10.5)
GFR calc Af Amer: 41 mL/min — ABNORMAL LOW (ref >60)
GFR calc non Af Amer: 34 mL/min — ABNORMAL LOW (ref >60)
Potassium: 4.1 mEq/L (ref 3.5–5.1)
Sodium: 136 mEq/L (ref 135–145)
Total Protein: 7.6 g/dL (ref 6.0–8.3)

## 2010-07-22 LAB — CBC
HCT: 32.7 % — ABNORMAL LOW (ref 36.0–46.0)
Hemoglobin: 10.6 g/dL — ABNORMAL LOW (ref 12.0–15.0)
MCHC: 32.4 g/dL (ref 30.0–36.0)
MCV: 79 fL (ref 78.0–100.0)

## 2010-07-22 LAB — POCT CARDIAC MARKERS: Myoglobin, poc: 200 ng/mL (ref 12–200)

## 2010-07-23 ENCOUNTER — Inpatient Hospital Stay (HOSPITAL_COMMUNITY): Payer: Medicare Other

## 2010-07-23 DIAGNOSIS — R079 Chest pain, unspecified: Secondary | ICD-10-CM

## 2010-07-23 DIAGNOSIS — R7989 Other specified abnormal findings of blood chemistry: Secondary | ICD-10-CM

## 2010-07-23 LAB — COMPREHENSIVE METABOLIC PANEL
Albumin: 3.5 g/dL (ref 3.5–5.2)
BUN: 23 mg/dL (ref 6–23)
Creatinine, Ser: 1.44 mg/dL — ABNORMAL HIGH (ref 0.4–1.2)
Total Protein: 7.3 g/dL (ref 6.0–8.3)

## 2010-07-23 LAB — D-DIMER, QUANTITATIVE: D-Dimer, Quant: 1.6 ug/mL-FEU — ABNORMAL HIGH (ref 0.00–0.48)

## 2010-07-23 LAB — CBC
MCHC: 32.1 g/dL (ref 30.0–36.0)
RDW: 16.9 % — ABNORMAL HIGH (ref 11.5–15.5)

## 2010-07-23 LAB — CARDIAC PANEL(CRET KIN+CKTOT+MB+TROPI)
CK, MB: 6.2 ng/mL (ref 0.3–4.0)
Total CK: 149 U/L (ref 7–177)

## 2010-07-23 LAB — CK TOTAL AND CKMB (NOT AT ARMC)
Relative Index: 3.4 — ABNORMAL HIGH (ref 0.0–2.5)
Total CK: 165 U/L (ref 7–177)

## 2010-07-23 LAB — MAGNESIUM: Magnesium: 2.1 mg/dL (ref 1.5–2.5)

## 2010-07-23 LAB — PROTIME-INR
INR: 2.85 — ABNORMAL HIGH (ref 0.00–1.49)
Prothrombin Time: 30 seconds — ABNORMAL HIGH (ref 11.6–15.2)

## 2010-07-23 LAB — TROPONIN I: Troponin I: 0.06 ng/mL (ref 0.00–0.06)

## 2010-07-23 MED ORDER — XENON XE 133 GAS
10.0000 | GAS_FOR_INHALATION | Freq: Once | RESPIRATORY_TRACT | Status: AC | PRN
  Administered 2010-07-23: 10 via RESPIRATORY_TRACT

## 2010-07-23 MED ORDER — TECHNETIUM TO 99M ALBUMIN AGGREGATED
6.0000 | Freq: Once | INTRAVENOUS | Status: AC | PRN
  Administered 2010-07-23: 6 via INTRAVENOUS

## 2010-07-23 NOTE — H&P (Signed)
NAME:  [name], [name] NO.:  [id number]  MEDICAL RECORD NO.:  [id number]           PATIENT TYPE:  E  LOCATION:  [facility]                         FACILITY:  [facility]  PHYSICIAN:  Talmage Nap, MD  DATE OF BIRTH:  12/29/1940  DATE OF ADMISSION:  07/22/2010 DATE OF DISCHARGE:                             HISTORY & PHYSICAL   PRIMARY CARDIOLOGIST:  Rollene Rotunda, MD, [facility].  PRIMARY CARE PHYSICIAN:  Unassigned.  History obtainable from the patient.  CHIEF COMPLAINT:  Chest pain and shortness of breath which started at about 7:00 p.m. on July 22, 2010.  HISTORY OF PRESENT ILLNESS:  The patient is a 70 year old African American female who was discharged from the hospital on April 10, 2010, with acute MI, status post cardiac catheterization with clean coronaries, and in that same admission, patient was also said to have had pulseless electrical activity arrest and was subsequently intubated. She also went into atrial fibrillation with tachybrady syndrome, required temporary pacemaker.  The patient was however resuscitated after being intubated and extubated.  She however presented today with sudden onset of chest pain and shortness of breath which started this evening.  The chest pain was described as tight, about 6/10 in intensity with shortness of breath.  She denied any radiation of the pain.  She denied any associated nausea or vomiting.  No fever.  No chills.  No rigor.  She also denied any history of cough, nausea or vomiting.  The chest pain was said to have been persistent, hence patient was brought to the emergency room to be evaluated.  PAST MEDICAL HISTORY: 1. Positive for prior MI. 2. 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 3. Hypertension. 4. Hypertrophic obstructive cardiomyopathy. 5. Chronic tobacco use. 6. Chronic kidney disease. 7. History of MRSA. 8. Third-degree uterine prolapse. 9. Iron  deficiency anemia.  PAST SURGICAL HISTORY: 1. MI status post cardiac catheterization with normal coronary. 2. Vent-dependent respiratory failure secondary to pulseless     electrical activity and subsequently extubated. 3. History of cardiogenic shock status post intubation.  PREADMISSION MEDICATIONS: 1. Coumadin. 2. Lopressor. 3. Protonix.  ALLERGIES:  She has no known allergies.  SOCIAL HISTORY:  The patient smokes about a pack of cigarette every 3 days in the past 40 years.  Denies any history of alcohol use, and she also denied any history of street drug use.  FAMILY HISTORY:  Negative for premature coronary artery disease or atrial fibrillation.  REVIEW OF SYSTEMS:  The patient denies any history of headaches.  No blurred vision.  No nausea or vomiting.  No fever.  No chills.  No rigor.  Complained of mild precordial discomfort, but no associated shortness of breath.  Denies any PND or orthopnea.  No cough.  No abdominal discomfort.  No diarrhea or hematochezia.  No dysuria or hematuria.  No swelling of the lower extremity.  No intolerance to heat or cold and no known psychiatric disorder.  PHYSICAL EXAMINATION:  GENERAL:  Elderly lady not in any obvious respiratory distress. VITAL SIGNS:  Blood pressure is 163/99, pulse is 72, respiratory rate  is 18, and temperature is 98.5. HEENT:  Mild pallor but pupils are reactive to light and extraocular muscles are intact. NECK:  No jugular venous distention.  No carotid bruit.  No lymphadenopathy. CHEST:  Clear to auscultation. HEART:  Sounds are one and two. ABDOMEN:  Soft, nontender.  Liver and spleen tip not palpable.  Bowel sounds are positive. EXTREMITIES:  No pedal edema. NEUROLOGIC:  Nonfocal. MUSCULOSKELETAL:  Arthritic changes in the knees and feet. NEUROPSYCHIATRIC:  Evaluation is unremarkable. SKIN:  Normal turgor.  LABORATORY DATA:  BNP 230.  Coagulation profile showed PT 28.6 and INR 2.68.  Chemistry shows  sodium of 136, potassium of 4.1, chloride of 107 with a bicarb of 25, glucose is 122, BUN is 20, and creatinine is 1.5. LFTs showed total protein 7.6, albumin is 3.6, AST 18, ALT 49, and alkaline phosphatase is 140.  First set of cardiac markers, troponin-I less than 0.06.  Hematological indices showed WBC of 7.5, hemoglobin of 10.6, hematocrit 32.7, and MCV of 79.0 with a platelet count of 255 and normal differentials.  EKG not done.  Chest x-ray showed faint haziness in the right lung base, which is said to be new and may represent a small infiltrate.  IMPRESSION: 1. Chest pain/shortness of breath, rule out acute coronary syndrome or     pulmonary embolism. 2. Atrial fibrillation. 3. Prior myocardial infarction, status post cardiac catheterization     with normal coronaries. 4. History of hypertrophic obstructive cardiomyopathy(HOCM). 5. History of pulmonary embolism status post intubation with     mechanical ventilation and temporary transvenous pacemaker. 6. Hypertension. 7. Chronic tobacco use. 8. Renal insufficiency. 9. Anemia.  PLAN:  Admit the patient to telemetry.  The patient will be given aspirin 325 mg p.o. daily and nitro 0.4 mg sublingual p.r.n. for chest pain.  She will be on O2 via nasal cannula 3 L per minute.  She will also be on Lopressor 50 mg p.o. b.i.d. and Lasix 40 mg p.o. daily.  The patient will be on Protonix 40 mg IV q.24 hours for GI prophylaxis, and Coumadin dosing to be done by Pharmacy.  Further lab work to be done on this patient will include cardiac enzymes q.6 x3 and D-dimer, and if positive, V/Q scan will be done next.CBCD,CMP, and magnesium will be repeated in the a.m.  PT, PTT, and INR will be repeated in the a.m.  The patient will be followed and evaluated on a daily basis.     Talmage Nap, MD     CN/MEDQ  D:  07/23/2010  T:  07/23/2010  Job:  [id number]  Electronically Signed by Talmage Nap  on 07/23/2010 02:19:30 AM

## 2010-07-24 ENCOUNTER — Inpatient Hospital Stay (HOSPITAL_COMMUNITY): Payer: Medicare Other

## 2010-07-24 LAB — CBC
HCT: 34.7 % — ABNORMAL LOW (ref 36.0–46.0)
HCT: 34.9 % — ABNORMAL LOW (ref 36.0–46.0)
HCT: 36.3 % (ref 36.0–46.0)
Hemoglobin: 11.2 g/dL — ABNORMAL LOW (ref 12.0–15.0)
Hemoglobin: 11.5 g/dL — ABNORMAL LOW (ref 12.0–15.0)
Hemoglobin: 11.7 g/dL — ABNORMAL LOW (ref 12.0–15.0)
MCH: 25.6 pg — ABNORMAL LOW (ref 26.0–34.0)
MCH: 25.7 pg — ABNORMAL LOW (ref 26.0–34.0)
MCH: 26 pg (ref 26.0–34.0)
MCHC: 32.7 g/dL (ref 30.0–36.0)
MCHC: 33.1 g/dL (ref 30.0–36.0)
MCV: 78.1 fL (ref 78.0–100.0)
MCV: 78.7 fL (ref 78.0–100.0)
Platelets: 263 10*3/uL (ref 150–400)
RBC: 4.43 MIL/uL (ref 3.87–5.11)
RBC: 4.47 MIL/uL (ref 3.87–5.11)
RBC: 4.57 MIL/uL (ref 3.87–5.11)
RDW: 15.9 % — ABNORMAL HIGH (ref 11.5–15.5)
WBC: 9.7 10*3/uL (ref 4.0–10.5)

## 2010-07-24 LAB — DIFFERENTIAL
Basophils Absolute: 0 10*3/uL (ref 0.0–0.1)
Basophils Absolute: 0 10*3/uL (ref 0.0–0.1)
Eosinophils Relative: 4 % (ref 0–5)
Lymphocytes Relative: 15 % (ref 12–46)
Lymphocytes Relative: 19 % (ref 12–46)
Lymphs Abs: 1.8 10*3/uL (ref 0.7–4.0)
Monocytes Absolute: 0.5 10*3/uL (ref 0.1–1.0)
Monocytes Absolute: 0.7 10*3/uL (ref 0.1–1.0)
Neutro Abs: 7 10*3/uL (ref 1.7–7.7)
Neutro Abs: 8.5 10*3/uL — ABNORMAL HIGH (ref 1.7–7.7)

## 2010-07-24 LAB — COMPREHENSIVE METABOLIC PANEL
Albumin: 3.2 g/dL — ABNORMAL LOW (ref 3.5–5.2)
BUN: 18 mg/dL (ref 6–23)
Calcium: 9.7 mg/dL (ref 8.4–10.5)
Chloride: 102 mEq/L (ref 96–112)
Creatinine, Ser: 1.5 mg/dL — ABNORMAL HIGH (ref 0.4–1.2)
GFR calc non Af Amer: 34 mL/min — ABNORMAL LOW (ref >60)
Total Bilirubin: 0.3 mg/dL (ref 0.3–1.2)

## 2010-07-24 LAB — TROPONIN I
Troponin I: 0.08 ng/mL — ABNORMAL HIGH (ref 0.00–0.06)
Troponin I: 0.34 ng/mL — ABNORMAL HIGH (ref 0.00–0.06)

## 2010-07-24 LAB — POCT CARDIAC MARKERS
CKMB, poc: 2.8 ng/mL (ref 1.0–8.0)
Myoglobin, poc: 216 ng/mL (ref 12–200)

## 2010-07-24 LAB — BASIC METABOLIC PANEL
CO2: 21 mEq/L (ref 19–32)
CO2: 22 mEq/L (ref 19–32)
CO2: 26 mEq/L (ref 19–32)
CO2: 27 mEq/L (ref 19–32)
Calcium: 10.5 mg/dL (ref 8.4–10.5)
Calcium: 10.6 mg/dL — ABNORMAL HIGH (ref 8.4–10.5)
Calcium: 11.3 mg/dL — ABNORMAL HIGH (ref 8.4–10.5)
Chloride: 104 mEq/L (ref 96–112)
Chloride: 105 mEq/L (ref 96–112)
Chloride: 109 mEq/L (ref 96–112)
Chloride: 109 mEq/L (ref 96–112)
Creatinine, Ser: 1.25 mg/dL — ABNORMAL HIGH (ref 0.4–1.2)
Creatinine, Ser: 1.29 mg/dL — ABNORMAL HIGH (ref 0.4–1.2)
Creatinine, Ser: 1.5 mg/dL — ABNORMAL HIGH (ref 0.4–1.2)
GFR calc Af Amer: 41 mL/min — ABNORMAL LOW (ref >60)
GFR calc Af Amer: 42 mL/min — ABNORMAL LOW (ref >60)
GFR calc Af Amer: 49 mL/min — ABNORMAL LOW (ref >60)
GFR calc non Af Amer: 29 mL/min — ABNORMAL LOW (ref >60)
GFR calc non Af Amer: 41 mL/min — ABNORMAL LOW (ref >60)
Glucose, Bld: 103 mg/dL — ABNORMAL HIGH (ref 70–99)
Glucose, Bld: 94 mg/dL (ref 70–99)
Glucose, Bld: 99 mg/dL (ref 70–99)
Potassium: 3.2 mEq/L — ABNORMAL LOW (ref 3.5–5.1)
Potassium: 4.1 mEq/L (ref 3.5–5.1)
Potassium: 4.2 mEq/L (ref 3.5–5.1)
Potassium: 4.7 mEq/L (ref 3.5–5.1)
Sodium: 138 mEq/L (ref 135–145)
Sodium: 138 mEq/L (ref 135–145)
Sodium: 139 mEq/L (ref 135–145)
Sodium: 141 mEq/L (ref 135–145)

## 2010-07-24 LAB — URINALYSIS, ROUTINE W REFLEX MICROSCOPIC
Bilirubin Urine: NEGATIVE
Glucose, UA: NEGATIVE mg/dL
Specific Gravity, Urine: 1.006 (ref 1.005–1.030)

## 2010-07-24 LAB — CK TOTAL AND CKMB (NOT AT ARMC)
CK, MB: 4.6 ng/mL — ABNORMAL HIGH (ref 0.3–4.0)
Relative Index: 2.6 — ABNORMAL HIGH (ref 0.0–2.5)
Relative Index: 4.5 — ABNORMAL HIGH (ref 0.0–2.5)

## 2010-07-24 LAB — URINE MICROSCOPIC-ADD ON

## 2010-07-24 LAB — PROTIME-INR
INR: 1 (ref 0.00–1.49)
INR: 2.59 — ABNORMAL HIGH (ref 0.00–1.49)
Prothrombin Time: 12.5 seconds (ref 11.6–15.2)
Prothrombin Time: 27.9 seconds — ABNORMAL HIGH (ref 11.6–15.2)

## 2010-07-24 LAB — T4, FREE: Free T4: 1.04 ng/dL (ref 0.80–1.80)

## 2010-07-24 LAB — LIPID PANEL
LDL Cholesterol: 108 mg/dL — ABNORMAL HIGH (ref 0–99)
Total CHOL/HDL Ratio: 3.8 RATIO
Triglycerides: 56 mg/dL (ref <150)
VLDL: 11 mg/dL (ref 0–40)

## 2010-07-24 LAB — HEPARIN LEVEL (UNFRACTIONATED): Heparin Unfractionated: 0.4 IU/mL (ref 0.30–0.70)

## 2010-07-24 LAB — APTT
aPTT: 26 seconds (ref 24–37)
aPTT: 27 seconds (ref 24–37)

## 2010-07-24 LAB — BRAIN NATRIURETIC PEPTIDE
Pro B Natriuretic peptide (BNP): 281 pg/mL — ABNORMAL HIGH (ref 0.0–100.0)
Pro B Natriuretic peptide (BNP): 383 pg/mL — ABNORMAL HIGH (ref 0.0–100.0)

## 2010-07-24 LAB — URINE CULTURE

## 2010-07-24 LAB — TSH: TSH: 0.937 u[IU]/mL (ref 0.350–4.500)

## 2010-07-28 NOTE — Medication Information (Signed)
Summary: rov/ewj  Anticoagulant Therapy  Managed by: Windell Hummingbird, RN Referring MD: Antoine Poche PCP: None Supervising MD: Jens Som MD, Arlys John Indication 1: Atrial Fibrillation Lab Used: [facility] [place] Site: [address] INR POC 3.3 INR RANGE 2.0-3.0  Dietary changes: no    Health status changes: no    Bleeding/hemorrhagic complications: no    Recent/future hospitalizations: no    Any changes in medication regimen? no    Recent/future dental: no  Any missed doses?: no       Is patient compliant with meds? yes       Allergies: No Known Drug Allergies  Anticoagulation Management History:      The patient is taking warfarin and comes in today for a routine follow up visit.  Positive risk factors for bleeding include an age of 70 years or older.  The bleeding index is 'intermediate risk'.  Positive CHADS2 values include History of CHF and History of HTN.  Negative CHADS2 values include Age > 20 years old.  Anticoagulation responsible provider: Jens Som MD, Arlys John.  INR POC: 3.3.  Cuvette Lot#: [id number].  Exp: 07/2011.    Anticoagulation Management Assessment/Plan:      The patient's current anticoagulation dose is Warfarin sodium 7.5 mg tabs: Use as directed by Anticoagulation Clinic.  The target INR is 2.0-3.0.  The next INR is due 08/03/2010.  Anticoagulation instructions were given to home health nurse.  Results were reviewed/authorized by Windell Hummingbird, RN.  She was notified by Windell Hummingbird, RN.         Prior Anticoagulation Instructions: INR 2.0  Continue to take 1 tablet everyday except on Tuesdays and Saturdays when you take 1 1/2 tablets.  Recheck INR in 2 weeks.    Current Anticoagulation Instructions: INR 3.3 Tomorrow (Tuesday) take only 1/2 tablet.  Then, resume taking 1 tablet every day, except take 1 1/2 tablets on Tuesdays and Saturdays. Recheck in 2 weeks.

## 2010-07-28 NOTE — Miscellaneous (Signed)
Summary: [facility] Episode Summary Report   [facility] Episode Summary Report   Imported By: Roderic Ovens 07/20/2010 09:43:34  _____________________________________________________________________  External Attachment:    Type:   Image     Comment:   External Document

## 2010-07-28 NOTE — Consult Note (Signed)
[name], [name] NO.:  [id number]  MEDICAL RECORD NO.:  [id number]           PATIENT TYPE:  I  LOCATION:  2501                         FACILITY:  [facility]  PHYSICIAN:  Veverly Fells. Excell Seltzer, MD  DATE OF BIRTH:  August 26, 1940  DATE OF CONSULTATION:  07/23/2010 DATE OF DISCHARGE:                                CONSULTATION   REASON FOR CONSULTATION:  Chest pain with abnormal cardiac markers.  HISTORY OF PRESENT ILLNESS:  Ms. Winning is a 70 year old woman with hypertrophic cardiomyopathy who presented for hospitalization because of chest pain and dyspnea.  She reports a feeling of chest heaviness with shortness of breath that has occurred transiently over the last 2 days. Her first episode was 2 days ago with a total duration of about 30 minutes and then she had another episode yesterday evening lasting about 1 hour.  She otherwise has been in her baseline.  She denies edema, orthopnea, or PND.  Her chest discomfort is described as a "pressure." She states that her symptoms resolved spontaneously.  She did not have lightheadedness, palpitations, or near syncope.  The patient was admitted in November 2011 with atrial fib with RVR and non-ST-elevation infarction with a background of hypertrophic cardiomyopathy and she was clinically deteriorated during that hospitalization and ended up in the hospital for about 3 weeks requiring mechanical ventilation.  At that time, she underwent cardiac catheterization showing diffuse nonobstructive CAD.  She was discharged to a skilled nursing facility, but since the end of December 2011 she has been back home.  PAST MEDICAL HISTORY: 1. Hypertrophic cardiomyopathy.  Most recent echocardiogram was on     April 01, 2010.  The patient had vigorous LV function with an     estimated ejection fraction of 65-70%.  There was a peak gradient     by echo of 104 mmHg.  The left atrium was moderately dilated.  The     left ventricular  septum measured 16 mm and the posterior wall also     measured 16 mm.  She was previously noted to have asymmetric septal     hypertrophy with septal diameter of 1.8 cm. 2. Paroxysmal atrial fibrillation, maintained on chronic warfarin. 3. Essential hypertension. 4. Chronic kidney disease, stage III. 5. Chronic ongoing tobacco use. 6. Iron deficiency anemia.  HOME MEDICATIONS: 1. Aspirin 81 mg daily. 2. Rosuvastatin 10 mg daily. 3. Protonix 40 mg daily. 4. Metoprolol 50 mg twice daily. 5. Iron 150 mg twice daily. 6. Warfarin 7.5 mg as directed. 7. Lasix 40 mg daily has been added in the hospital and she otherwise     is on her home medicines.  ALLERGIES:  NKDA.  SOCIAL HISTORY:  The patient lives locally in [place].  She smokes cigarettes for greater than 40 years.  She does not drink alcohol or use illicit drugs.  FAMILY HISTORY:  Negative for premature CAD.  REVIEW OF SYSTEMS:  Negative except as per HPI.  PHYSICAL EXAMINATION:  GENERAL:  This is a nice [place] American woman, appears older than her stated age.  She is in no acute distress, on oxygen by  nasal cannula. VITAL SIGNS:  Temperature is 97.5, heart rate is 58-75, respiratory rate is 20-24, blood pressure is 140/84, and oxygen saturation is 98% on 3 liters of oxygen by nasal cannula. HEENT:  Normal. NECK:  Normal carotid upstrokes without bruits.  JVP is normal.  No thyromegaly or thyroid nodules. LUNGS:  Clear bilaterally. CHEST:  Equal expansion. HEART:  The apex is discreet and nondisplaced.  The heart is regular rate and rhythm with a grade 3/6 harsh midsystolic murmur along the left sternal border. ABDOMEN:  Soft and nontender.  No organomegaly.  Positive bowel sounds. BACK:  No CVA tenderness. EXTREMITIES:  No clubbing, cyanosis, or edema.  Peripheral pulses are intact and equal. SKIN:  Warm and dry without rash.  EKG shows normal sinus rhythm with marked LVH and repolarization changes associated  with LVH.  LABORATORY DATA:  White blood cell count of 7.2, hemoglobin 10.6, and platelet count 258,000.  INRs 2.9.  Sodium is 138, potassium is 4.3, creatinine is 1.44, and BUN is 23.  LFTs are normal.  Cardiac enzymes show troponins of 0.06, 0.07, and 0.07; CKs are 165, 149, and 154; and MBs are 5.6, 6.2, and 6.6.  Chest x-ray shows a hazy infiltrate at the right lung base.  V/Q scan is low probability for pulmonary embolus.  FINAL ASSESSMENT: 1. This is a 70 year old woman with transient chest pain and dyspnea.     Her extensive records were reviewed and she only had mild coronary     artery disease at her recent catheterization.  I do not think this     is representative of acute coronary syndrome.  She has very low     level cardiac enzymes with troponin just 100, above the normal     range.  She may have subendocardial ischemia related to her     hypertrophic cardiomyopathy.  I would recommend trying to increase     her metoprolol to 75 mg twice daily if she will tolerate this     without brady arrhythmias.  Her BNP is elevated at 230 and even     though she does not have overt signs of clinical heart failure I     agree with trying her on low-dose oral Lasix.  We will have to be     cautious to avoid overdiuresis because of her LV outflow tract     gradient. 2. Paroxysmal atrial fibrillation.  The patient is maintaining sinus     rhythm.  It is possible that her transient symptoms could be     related to paroxysms of atrial fib.  We will continue to monitor     her on telemetry while she is in the hospital and could consider     outpatient monitoring. 3. Ongoing tobacco abuse.  Cessation was advised and the patient was     counseled regarding the importance of this.  Thanks for the opportunity to see this nice patient.  We will follow along during her hospital course.     Veverly Fells. Excell Seltzer, MD     MDC/MEDQ  D:  07/23/2010  T:  07/24/2010  Job:   [id number]  Electronically Signed by Tonny Bollman MD on 07/28/2010 06:03:44 PM

## 2010-08-03 ENCOUNTER — Ambulatory Visit (INDEPENDENT_AMBULATORY_CARE_PROVIDER_SITE_OTHER): Payer: Medicare Other | Admitting: *Deleted

## 2010-08-03 DIAGNOSIS — I4891 Unspecified atrial fibrillation: Secondary | ICD-10-CM

## 2010-08-03 LAB — POCT INR: INR: 1.9

## 2010-08-03 NOTE — Patient Instructions (Signed)
INR 1.9 Take an extra 1/2 tablet today. Then continue current dose: 1 tablet every day, except for Tuesdays and Saturdays take 1.5 tablet  Recheck INR in 4 weeks

## 2010-08-15 LAB — POCT I-STAT 3, ART BLOOD GAS (G3+)
Acid-base deficit: 1 mmol/L (ref 0.0–2.0)
Acid-base deficit: 3 mmol/L — ABNORMAL HIGH (ref 0.0–2.0)
Acid-base deficit: 4 mmol/L — ABNORMAL HIGH (ref 0.0–2.0)
Acid-base deficit: 5 mmol/L — ABNORMAL HIGH (ref 0.0–2.0)
Bicarbonate: 19.2 mEq/L — ABNORMAL LOW (ref 20.0–24.0)
Bicarbonate: 20.5 mEq/L (ref 20.0–24.0)
Bicarbonate: 22.8 mEq/L (ref 20.0–24.0)
Bicarbonate: 23.6 mEq/L (ref 20.0–24.0)
O2 Saturation: 96 %
O2 Saturation: 96 %
O2 Saturation: 97 %
O2 Saturation: 98 %
Patient temperature: 100.4
Patient temperature: 98.6
Patient temperature: 99.6
Patient temperature: 99.6
TCO2: 20 mmol/L (ref 0–100)
TCO2: 21 mmol/L (ref 0–100)
TCO2: 24 mmol/L (ref 0–100)
TCO2: 25 mmol/L (ref 0–100)
pCO2 arterial: 28 mmHg — ABNORMAL LOW (ref 35.0–45.0)
pCO2 arterial: 31.7 mmHg — ABNORMAL LOW (ref 35.0–45.0)
pCO2 arterial: 35 mmHg (ref 35.0–45.0)
pCO2 arterial: 60.7 mmHg (ref 35.0–45.0)
pH, Arterial: 7.204 — ABNORMAL LOW (ref 7.350–7.400)
pH, Arterial: 7.42 — ABNORMAL HIGH (ref 7.350–7.400)
pH, Arterial: 7.422 — ABNORMAL HIGH (ref 7.350–7.400)
pH, Arterial: 7.446 — ABNORMAL HIGH (ref 7.350–7.400)
pO2, Arterial: 100 mmHg (ref 80.0–100.0)
pO2, Arterial: 116 mmHg — ABNORMAL HIGH (ref 80.0–100.0)
pO2, Arterial: 79 mmHg — ABNORMAL LOW (ref 80.0–100.0)
pO2, Arterial: 81 mmHg (ref 80.0–100.0)

## 2010-08-15 LAB — GLUCOSE, CAPILLARY
Glucose-Capillary: 108 mg/dL — ABNORMAL HIGH (ref 70–99)
Glucose-Capillary: 119 mg/dL — ABNORMAL HIGH (ref 70–99)
Glucose-Capillary: 128 mg/dL — ABNORMAL HIGH (ref 70–99)
Glucose-Capillary: 82 mg/dL (ref 70–99)
Glucose-Capillary: 87 mg/dL (ref 70–99)
Glucose-Capillary: 95 mg/dL (ref 70–99)
Glucose-Capillary: 98 mg/dL (ref 70–99)

## 2010-08-15 LAB — CK TOTAL AND CKMB (NOT AT ARMC)
CK, MB: 4.1 ng/mL — ABNORMAL HIGH (ref 0.3–4.0)
Relative Index: 2.6 — ABNORMAL HIGH (ref 0.0–2.5)

## 2010-08-15 LAB — BASIC METABOLIC PANEL
BUN: 28 mg/dL — ABNORMAL HIGH (ref 6–23)
BUN: 30 mg/dL — ABNORMAL HIGH (ref 6–23)
BUN: 33 mg/dL — ABNORMAL HIGH (ref 6–23)
CO2: 22 mEq/L (ref 19–32)
Calcium: 10 mg/dL (ref 8.4–10.5)
Chloride: 102 mEq/L (ref 96–112)
Chloride: 102 mEq/L (ref 96–112)
Chloride: 108 mEq/L (ref 96–112)
Creatinine, Ser: 2.14 mg/dL — ABNORMAL HIGH (ref 0.4–1.2)
Creatinine, Ser: 2.31 mg/dL — ABNORMAL HIGH (ref 0.4–1.2)
Creatinine, Ser: 2.47 mg/dL — ABNORMAL HIGH (ref 0.4–1.2)
GFR calc Af Amer: 24 mL/min — ABNORMAL LOW (ref >60)
GFR calc Af Amer: 26 mL/min — ABNORMAL LOW (ref >60)
GFR calc Af Amer: 26 mL/min — ABNORMAL LOW (ref >60)
GFR calc Af Amer: 28 mL/min — ABNORMAL LOW (ref >60)
GFR calc non Af Amer: 20 mL/min — ABNORMAL LOW (ref >60)
GFR calc non Af Amer: 23 mL/min — ABNORMAL LOW (ref >60)
Potassium: 3.4 mEq/L — ABNORMAL LOW (ref 3.5–5.1)
Potassium: 3.7 mEq/L (ref 3.5–5.1)

## 2010-08-15 LAB — POCT CARDIAC MARKERS
CKMB, poc: 3.4 ng/mL (ref 1.0–8.0)
Myoglobin, poc: >500 ng/mL (ref 12–200)
Troponin i, poc: <0.05 ng/mL (ref 0.00–0.09)

## 2010-08-15 LAB — DIFFERENTIAL
Band Neutrophils: 0 % (ref 0–10)
Basophils Absolute: 0 10*3/uL (ref 0.0–0.1)
Basophils Relative: 0 % (ref 0–1)
Blasts: 0 %
Eosinophils Relative: 0 % (ref 0–5)
Lymphocytes Relative: 43 % (ref 12–46)
Lymphs Abs: 6.3 10*3/uL — ABNORMAL HIGH (ref 0.7–4.0)
Monocytes Absolute: 1.2 10*3/uL — ABNORMAL HIGH (ref 0.1–1.0)
Monocytes Relative: 8 % (ref 3–12)

## 2010-08-15 LAB — CULTURE, BLOOD (ROUTINE X 2): Culture: NO GROWTH

## 2010-08-15 LAB — LIPID PANEL
LDL Cholesterol: 114 mg/dL — ABNORMAL HIGH (ref 0–99)
Total CHOL/HDL Ratio: 4.2 RATIO
Triglycerides: 71 mg/dL (ref <150)
VLDL: 14 mg/dL (ref 0–40)

## 2010-08-15 LAB — CARDIAC PANEL(CRET KIN+CKTOT+MB+TROPI)
CK, MB: 6.9 ng/mL — ABNORMAL HIGH (ref 0.3–4.0)
Relative Index: 0.6 (ref 0.0–2.5)
Relative Index: 0.9 (ref 0.0–2.5)
Total CK: 1067 U/L — ABNORMAL HIGH (ref 7–177)
Total CK: 1073 U/L — ABNORMAL HIGH (ref 7–177)
Troponin I: 0.78 ng/mL (ref 0.00–0.06)

## 2010-08-15 LAB — CBC
HCT: 27.7 % — ABNORMAL LOW (ref 36.0–46.0)
HCT: 30.8 % — ABNORMAL LOW (ref 36.0–46.0)
Hemoglobin: 10.2 g/dL — ABNORMAL LOW (ref 12.0–15.0)
MCHC: 33.1 g/dL (ref 30.0–36.0)
MCV: 78.8 fL (ref 78.0–100.0)
MCV: 79 fL (ref 78.0–100.0)
Platelets: 205 10*3/uL (ref 150–400)
Platelets: 222 10*3/uL (ref 150–400)
Platelets: 294 10*3/uL (ref 150–400)
RBC: 3.53 MIL/uL — ABNORMAL LOW (ref 3.87–5.11)
RBC: 3.67 MIL/uL — ABNORMAL LOW (ref 3.87–5.11)
RBC: 3.9 MIL/uL (ref 3.87–5.11)
RBC: 4.05 MIL/uL (ref 3.87–5.11)
RDW: 16.8 % — ABNORMAL HIGH (ref 11.5–15.5)
WBC: 10.7 10*3/uL — ABNORMAL HIGH (ref 4.0–10.5)
WBC: 11.5 10*3/uL — ABNORMAL HIGH (ref 4.0–10.5)
WBC: 11.5 10*3/uL — ABNORMAL HIGH (ref 4.0–10.5)

## 2010-08-15 LAB — URINE CULTURE

## 2010-08-15 LAB — COMPREHENSIVE METABOLIC PANEL
Albumin: 3.6 g/dL (ref 3.5–5.2)
BUN: 16 mg/dL (ref 6–23)
Calcium: 9.9 mg/dL (ref 8.4–10.5)
Creatinine, Ser: 1.83 mg/dL — ABNORMAL HIGH (ref 0.4–1.2)
Total Protein: 7.6 g/dL (ref 6.0–8.3)

## 2010-08-15 LAB — URINALYSIS, ROUTINE W REFLEX MICROSCOPIC
Bilirubin Urine: NEGATIVE
Ketones, ur: NEGATIVE mg/dL
Protein, ur: >300 mg/dL — AB
Urobilinogen, UA: 0.2 mg/dL (ref 0.0–1.0)

## 2010-08-15 LAB — IRON AND TIBC: Iron: 13 ug/dL — ABNORMAL LOW (ref 42–135)

## 2010-08-15 LAB — URINE MICROSCOPIC-ADD ON

## 2010-08-15 LAB — MAGNESIUM: Magnesium: 1.9 mg/dL (ref 1.5–2.5)

## 2010-08-15 LAB — BRAIN NATRIURETIC PEPTIDE: Pro B Natriuretic peptide (BNP): 932 pg/mL — ABNORMAL HIGH (ref 0.0–100.0)

## 2010-08-15 LAB — MRSA PCR SCREENING: MRSA by PCR: NEGATIVE

## 2010-08-24 ENCOUNTER — Ambulatory Visit (INDEPENDENT_AMBULATORY_CARE_PROVIDER_SITE_OTHER): Payer: Medicare Other | Admitting: Physician Assistant

## 2010-08-24 ENCOUNTER — Encounter: Payer: Self-pay | Admitting: Physician Assistant

## 2010-08-24 VITALS — BP 148/92 | HR 61 | Resp 18 | Ht 63.0 in | Wt 174.8 lb

## 2010-08-24 DIAGNOSIS — I4891 Unspecified atrial fibrillation: Secondary | ICD-10-CM

## 2010-08-24 DIAGNOSIS — R0602 Shortness of breath: Secondary | ICD-10-CM | POA: Insufficient documentation

## 2010-08-24 DIAGNOSIS — E785 Hyperlipidemia, unspecified: Secondary | ICD-10-CM

## 2010-08-24 DIAGNOSIS — I428 Other cardiomyopathies: Secondary | ICD-10-CM

## 2010-08-24 LAB — BASIC METABOLIC PANEL
CO2: 27 mEq/L (ref 19–32)
Calcium: 11.3 mg/dL — ABNORMAL HIGH (ref 8.4–10.5)
Glucose, Bld: 89 mg/dL (ref 70–99)
Potassium: 4.5 mEq/L (ref 3.5–5.1)
Sodium: 138 mEq/L (ref 135–145)

## 2010-08-24 MED ORDER — FUROSEMIDE 20 MG PO TABS: 20.0000 mg | ORAL_TABLET | Freq: Every day | ORAL | Status: DC

## 2010-08-24 MED ORDER — ROSUVASTATIN CALCIUM 10 MG PO TABS: 10.0000 mg | ORAL_TABLET | Freq: Every day | ORAL | Status: DC

## 2010-08-24 NOTE — Assessment & Plan Note (Signed)
We'll see how her blood pressure responds to the increasing her furosemide.

## 2010-08-24 NOTE — Patient Instructions (Signed)
Your physician recommends that you schedule a follow-up appointment in: 6 weeks with Dr. Antoine Poche Your physician has recommended you make the following change in your medication: Increase furosemide to 20 mg by mouth daily.  Start Crestor 10 mg by mouth daily at bedtime Your physician recommends that you return for lab work in: 1 week--August 31, 2010--bmp- 425.4 Your physician recommends that you return for fasting lab work in: 6 weeks--Oct 06, 2010--lipid,liver--272.4

## 2010-08-24 NOTE — Assessment & Plan Note (Signed)
We discussed the importance of tobacco cessation 

## 2010-08-24 NOTE — Assessment & Plan Note (Signed)
I suspect she may have some mild volume overload contributing to her symptoms.  Her symptoms may also be related to her ongoing tobacco abuse.  Increase her Lasix to 20 mg once a day.  Check a basic metabolic panel and BNP today.  Repeat a basic metabolic panel in one week.

## 2010-08-24 NOTE — Assessment & Plan Note (Signed)
She is off of a statin for unclear reasons.  Restart Crestor 10 mg daily.  Followup lipids and LFTs in 6-8 weeks.

## 2010-08-24 NOTE — Progress Notes (Signed)
History of Present Illness: Primary Cardiologist:  Dr. Rollene Rotunda  [name] is a 70 y.o. female with a h/o AFib with RVR in 03/2010 with assoc. NSTEMI.  Cath at that time demonstrated nonobstructive CAD.  Her hospitalization was c/b bradycardic arrest and prolonged hospitalization.  She was hospitalized recently with chest pain and dyspnea.  She was seen by Dr. Excell Seltzer on 3/15 in consultation.  She had mildly elevated Troponins (0.07) thought to be related to subendocardial ischemia related to her HOCM.  V/Q was low probability for pulmonary embolism.  With recent cath with nonobstructive disease, no further workup was recommended.  It was recommended to increase her beta blocker.  Her BNP was slightly elevated and her lasix was also adjusted.     She denies any further chest pain.  She describes dyspnea with exertion.  She describes probable NYHA class IIb symptoms.  She sleeps on 2 pillows without change.  She denies PND.  She denies significant pedal edema.  She denies syncope.  Past Medical History  Diagnosis Date  . CAD (coronary artery disease)     cath 11/11: LAD 40%, mid-dist 25-30%; prox CFX 30%, mid 40%; OM 50%; PDA 50%; PLV 50%;  EF 65%  . Acute myocardial infarction     in setting of AFib with RVR 03/2010  . Atrial fibrillation     coumadin  . HOCM (hypertrophic obstructive cardiomyopathy)     Echo 03/2010: with peak gradient of Hg, ejection fraction (EF) 65-70%; mod LVH; mod LAE  . Hypertension   . Tobacco abuse   . Chronic kidney disease     Stage 4  . Iron deficiency     anemia  . CHF (congestive heart failure)     preserved LVF    Current Outpatient Prescriptions  Medication Sig Dispense Refill  . acetaminophen (TYLENOL) 80 MG/0.8ML suspension Take 10 mg/kg by mouth every 4 (four) hours as needed.        . ferrous sulfate 325 (65 FE) MG tablet Take 325 mg by mouth 2 (two) times daily.       . furosemide (LASIX) 20 MG tablet Take 20 mg by mouth daily.         . metoprolol (TOPROL-XL) 50 MG 24 hr tablet Take 50 mg by mouth 2 (two) times daily.        . nitroGLYCERIN (NITROSTAT) 0.4 MG SL tablet Place 0.4 mg under the tongue every 5 (five) minutes as needed.        . pantoprazole (PROTONIX) 40 MG tablet Take 40 mg by mouth daily.        Marland Kitchen warfarin (COUMADIN) 4 MG tablet Take by mouth as directed.        Marland Kitchen DISCONTD: aspirin 81 MG tablet Take 81 mg by mouth daily.        Marland Kitchen DISCONTD: disopyramide (NORPACE CR) 100 MG 12 hr capsule Take 100 mg by mouth 2 (two) times daily.        Marland Kitchen DISCONTD: guaifenesin (ROBAFEN) 100 MG/5ML syrup Take 200 mg by mouth 3 (three) times daily as needed.        Marland Kitchen DISCONTD: pravastatin (PRAVACHOL) 80 MG tablet Take 80 mg by mouth daily.          No Known Allergies  Vital Signs: BP 148/92  Pulse 61  Resp 18  Ht 5\' 3"  (1.6 m)  Wt 174 lb 12.8 oz (79.289 kg)  BMI 30.96 kg/m2  PHYSICAL EXAM: Well nourished, well developed,  in no acute distress HEENT: normal Neck: slight elevated JVD Cardiac:  normal S1, S2; RRR; 2/6 systolic murmur along LSB Lungs:  Decreased breath sounds bilaterally, no wheezing, rhonchi or rales Abd: soft, nontender, no hepatomegaly Ext: no edema Skin: warm and dry Neuro:  CNs 2-12 intact, no focal abnormalities noted  EKG:  NSR, HR 61, normal axis, LVH with repolarization abnormality, no change from prior tracings.  ASSESSMENT AND PLAN:

## 2010-08-24 NOTE — Assessment & Plan Note (Signed)
As noted, her furosemide will be gently increased with a close eye on her renal function and potassium.  If she begins to show signs of prerenal azotemia, her dose will be decreased.  Follow up with Dr. Antoine Poche in 6 weeks.

## 2010-08-24 NOTE — Assessment & Plan Note (Signed)
Maintaining sinus rhythm.  Continue followup with Coumadin clinic.

## 2010-08-31 ENCOUNTER — Ambulatory Visit (INDEPENDENT_AMBULATORY_CARE_PROVIDER_SITE_OTHER): Payer: Medicare Other | Admitting: *Deleted

## 2010-08-31 ENCOUNTER — Other Ambulatory Visit (INDEPENDENT_AMBULATORY_CARE_PROVIDER_SITE_OTHER): Payer: Medicare Other | Admitting: *Deleted

## 2010-08-31 DIAGNOSIS — I4891 Unspecified atrial fibrillation: Secondary | ICD-10-CM

## 2010-08-31 DIAGNOSIS — I428 Other cardiomyopathies: Secondary | ICD-10-CM

## 2010-08-31 LAB — BASIC METABOLIC PANEL
BUN: 26 mg/dL — ABNORMAL HIGH (ref 6–23)
Chloride: 103 mEq/L (ref 96–112)
Glucose, Bld: 92 mg/dL (ref 70–99)
Potassium: 4.5 mEq/L (ref 3.5–5.1)

## 2010-08-31 NOTE — Patient Instructions (Signed)
INR 1.9 Take an extra 1/2 tablet today. Then continue current dose: 1 tablet every day, except for Tuesdays and Saturdays take 1.5 tablet  Recheck INR in 4 weeks 

## 2010-09-01 NOTE — Progress Notes (Signed)
forward to MD

## 2010-09-02 NOTE — Discharge Summary (Signed)
[name], CURVIN NO.:  [id number]  MEDICAL RECORD NO.:  [id number]           PATIENT TYPE:  I  LOCATION:  2501                         FACILITY:  [facility]  PHYSICIAN:  Zannie Cove, MD     DATE OF BIRTH:  1941/01/30  DATE OF ADMISSION:  07/22/2010 DATE OF DISCHARGE:  07/24/2010                        DISCHARGE SUMMARY - REFERRING   PRIMARY PHYSICIAN:  None.  CARDIOLOGIST:  Rollene Rotunda, MD, [facility] with [place].  DISCHARGE DIAGNOSES: 1. Atypical chest pain felt to be secondary to subendocardial     ischemia. 2. Hypertrophic obstructive cardiomyopathy. 3. Nonobstructive coronary disease based on last cath in November     2011. 4. Paroxysmal atrial fibrillation, on Coumadin. 5. Tobacco use. 6. Chronic kidney disease stage III. 7. Essential hypertension. 8. Iron deficiency anemia.  DISCHARGE MEDICATIONS:  Are as follows; 1. Lasix 40 mg every other day. 2. Warfarin 7.5 mg p.o. daily. 3. Coumadin of her INR of 2-3. 4. Metoprolol 50 mg p.o. b.i.d. 5. Crestor 10 mg daily. 6. Iron complex 150 mg p.o. b.i.d. 7. Sublingual nitroglycerin 0.4 mg sublingual every 5 minutes p.r.n.     for chest pain x3. 8. Aspirin 81 mg daily. 9. Protonix 40 mg daily.  CONSULTANTS:  Dr. Rollene Rotunda with [facility].  DIAGNOSTICS INVESTIGATIONS:  V/Q scan low probability of PE and chest x- ray questionable hazy infiltrate of the right base.  HOSPITAL COURSE: 1. Ms. [name] is a 70 year old female with history of HOCM and     nonobstructive coronary disease based on last cath less than 1 year     ago presented to the hospital with chest pain.  She is found to     have minimally elevated troponin and CK-MB.  She also had a V/Q     scan done which showed low probability.  It is felt that she most     likely had an endocardial ischemia related to her hypertrophic     cardiomyopathy and hence her metoprolol dose was increased and she     was also started on low-dose  diuretics in the hospital.  However,     she started having episodes of bradycardia and sinus pauses and     hence her metoprolol dose was switched back down to 50 b.i.d. as     her basal dose.  She clinically improved to the course of her     hospital stay and was discharged home chest pain free, in a stable     condition to follow up with her primary physician, Dr. Antoine Wilkerson. 2. Questionable infiltrate of the left base, felt to be atelectasis.     The patient did not have any clinical signs or symptoms to suggest     a pneumonia and hence she was not treated for this.  DISCHARGE CONDITION:  Stable.  VITAL SIGNS:  On discharge, temperature is 98.7, pulse 69, blood pressure 123/46, respirations 16, satting 100% on 3 liters.  DISCHARGE LABS:  INR of 2.5.  BMET sodium 141, potassium 4.7, chloride 109, bicarb 29, BUN 26, creatinine 1.7, glucose 103.  DISCHARGE FOLLOW-UP:  With Dr. Antoine Wilkerson  on August 06, 2010 at 9:30 a.m.     Zannie Cove, MD     PJ/MEDQ  D:  08/31/2010  T:  08/31/2010  Job:  [id number]  Electronically Signed by Zannie Cove  on 09/02/2010 07:18:58 AM

## 2010-09-14 ENCOUNTER — Ambulatory Visit (INDEPENDENT_AMBULATORY_CARE_PROVIDER_SITE_OTHER): Payer: Medicare Other | Admitting: *Deleted

## 2010-09-14 DIAGNOSIS — I4891 Unspecified atrial fibrillation: Secondary | ICD-10-CM

## 2010-09-22 NOTE — H&P (Signed)
NAME:  [name], [name] NO.:  [id number]   MEDICAL RECORD NO.:  [id number]          PATIENT TYPE:  EMS   LOCATION:  MAJO                         FACILITY:  [facility]   PHYSICIAN:  Mickey Farber, MD      DATE OF BIRTH:  10/24/1942   DATE OF ADMISSION:  12/30/2008  DATE OF DISCHARGE:                              HISTORY & PHYSICAL   NO DICTATION GIVEN      Mickey Farber, MD  Electronically Signed     SS/MEDQ  D:  12/30/2008  T:  12/30/2008  Job:  [id number]

## 2010-09-22 NOTE — Discharge Summary (Signed)
[name], BONNIE NO.:  [id number]   MEDICAL RECORD NO.:  [id number]          PATIENT TYPE:  INP   LOCATION:  2014                         FACILITY:  [facility]   PHYSICIAN:  Rollene Rotunda, MD, FACCDATE OF BIRTH:  10/22/1942   DATE OF ADMISSION:  12/30/2008  DATE OF DISCHARGE:  01/03/2009                               DISCHARGE SUMMARY   PRIMARY CARDIOLOGIST:  Rollene Rotunda, MD, [facility]   PRIMARY CARE PHYSICIAN:  She does not have a primary care [name].   DISCHARGE DIAGNOSIS:  Hypertensive urgency.   SECONDARY DIAGNOSES:  1. Non-ST-segment elevation myocardial infarction with normal Lexiscan      Myoview this admission.  2. Acute on chronic renal insufficiency.  3. Anemia.  4. Acute on chronic diastolic congestive heart failure.  5. Supraventricular tachycardia with aberrancy.  6. Ongoing tobacco abuse.   ALLERGIES:  No known drug allergies.   PROCEDURE:  A 2-D echocardiogram performed on December 30, 2008, showing  EF of 65%.   PROCEDURE:  Lexiscan Myoview revealing an EF of 50% with evidence for  ischemia or infarct.  No focal left ventricular wall motion  abnormalities.   HISTORY OF PRESENT ILLNESS:  A 70 year old African American female with  prior history of hypertension on no medications at home until the early  morning hours of August 23 when she had sudden onset of shortness of  breath.  EMS was activated and she was found to be tachycardic and  hypertensive with blood pressure in 250s over 150s.  She was oxygenating  at approximately 60%.  In the [facility] ED, initially a code STEMI was  called; however, this was subsequently called off as it was felt that  her ECG was more likely to battle with significant LVH and early  repolarization.  She did subsequently develop SVT with aberrancy versus  ventricular tachycardia.  Secondary to this, she was placed on  amiodarone infusion.  Due to ongoing respiratory distress in the  emergency room, she  was intubated and subsequently admitted for further  evaluation.   HOSPITAL COURSE:  The patient was noted to have elevated CK to 1067, MB  to 9.7, troponin-I to 0.78 thus ruling in for a non-ST-elevation MI in  the setting of hypertensive urgency.  Her chest x-ray showed diffuse  central air space opacification, likely reflecting significant pulmonary  edema.  She was aggressively diuresed and was placed on intravenous  nitroglycerin, which was titrated for blood pressure effect.  A 2-D  echocardiogram was performed showing normal LV function.  The patient  was subsequently extubated on August 24th.  We were able to wean off her  IV nitroglycerin as her blood pressure was much better controlled and we  initiated beta where she was maintained on beta-blocker and subsequently  calcium channel blocker therapy.   In the setting of diuresis well as marked hypertension, [name] had  acute on chronic renal failure.  On admission, her creatinine was 1.83.  By August 25th, her creatinine had bumped to 2.47 with a BUN of 28  necessitating holding of her diuretics.  She  did have some improvement  in her BUN and creatinine at that point and was felt to require gentle  diuresis on August 26th and was treated with one dose of IV Lasix.  This  morning her creatinine was slightly higher at 2.30, BUN of 33, and  bicarb of 22.  We plan to continue to follow this closely as an  outpatient and also feel that she will need a renal vascular ultrasound  of her renal arteries as an outpatient.   In the setting of a non-ST-elevation MI, [name] underwent Lexiscan  Myoview this morning that showed no evidence of ischemia or infarct.  [name] is going to be discharged home this afternoon in good  condition.   DISCHARGE LABS:  Hemoglobin 10.6, hematocrit 32.0, WBC 9.9, platelets  238.  Sodium 135, potassium 3.3 (replaced prior to discharge), chloride  102, CO2 of 22, BUN 33, creatinine 2.31, glucose  110, total bilirubin  0.5, alkaline phosphatase 164, AST 25, ALT 12, total protein 7.6,  albumin 3.6, calcium 10.5, phosphorous 2.1, magnesium 1.8.  CK 1073, MB  6.9, troponin-I 0.68, BNP was 689.0 on admission.  Total cholesterol  168, triglycerides 71, HDL 40, LDL 114.  TSH 0.588.  Serum iron 13, TIBC  237, UIBC 224.  Urine culture showed no growth.  Blood culture has been  pending.  Fecal occult blood was negative.  MRSA screen was negative.   DISPOSITION:  The patient will be discharged home today in good  condition.   FOLLOWUP PLANS AND APPOINTMENTS:  We have arranged followup with Dr.  Antoine Poche in approximately 2 weeks.  We have also arranged for followup  basic metabolic panel on Monday, January 06, 2009, at 10 a.m. in our  office.   DISCHARGE MEDICATIONS:  1. Amlodipine 5 mg daily.  2. Aspirin 81 mg daily.  3. Lopressor 50 mg b.i.d.  4. Ferrous sulfate 325 mg b.i.d.   OUTSTANDING LABS AND STUDIES:  BMET is pending on January 06, 2009.   DURATION OF DISCHARGE ENCOUNTER:  Sixty minutes including physician  time.      Nicolasa Ducking, ANP      Rollene Rotunda, MD, [facility]  Electronically Signed    CB/MEDQ  D:  01/03/2009  T:  01/04/2009  Job:  [id number]

## 2010-09-22 NOTE — H&P (Signed)
[name], [name] NO.:  [id number]   MEDICAL RECORD NO.:  [id number]          PATIENT TYPE:  INP   LOCATION:  1823                         FACILITY:  [facility]   PHYSICIAN:  Mickey Farber, MD      DATE OF BIRTH:  10/24/1942   DATE OF ADMISSION:  12/30/2008  DATE OF DISCHARGE:                              HISTORY & PHYSICAL   PRIMARY CARDIOLOGIST:  None.   PRIMARY MEDICAL DOCTOR:  None.   CHIEF COMPLAINT:  Unable to obtain from patient as the patient is  intubated.  However, according to the patient's family, the patient was  short of breath.  Her contact number is Janaysha Depaulo at [phone],  Ameila Weldon at [phone] and Donell Beers at [phone].   HISTORY OF PRESENT ILLNESS:  A 70 year old African American female with  a past medical history only notable for hypertension presents to the  [facility] at the request of the [facility] ED for further  evaluation and management of shortness of breath.  Unfortunately, the  patient is not able to provide the history as she was intubated during  her course in the ED.  However, according to the patient's family  members, the patient complained of acute onset of shortness of breath.  EMS was called to her home.  At that time, they could not obtain a SaO2.  She was brought to the [facility] ED and her blood pressure was found to  be 250/150 with a heart rate of 130-150 beats per minute.  Her SaO2 was  56% on room air.  She was subsequently intubated for airway protection  and for hypoxemic respiratory failure.  Unfortunately, an arterial blood  gas was not obtained prior to intubation.  She could not provide a  history prior to intubation.  However, per family, she has not had any  exertional or rest chest pain, palpitations, PND, orthopnea or lower  extremity edema.  She was in her otherwise usual state of health up  until this acute onset of shortness of breath.   In the ED, the patient was intubated  for airway protection.  Her initial  EKG showed sinus tachycardia with a ventricular bigeminy pattern.  She  was then found to be in fast heart rate that was either an SVT with  aberrancy versus a ventricular tachycardia.  She was given amiodarone  bolus and started on a drip, as well as lidocaine 100 mg IV x1.  She was  also given Diltiazem 20 mg IV x1.  She was placed on a nitroglycerin  drip for her blood pressure.  Initially, a code STEMI was called due to  the complexity of her EKG and the concern of a new onset left bundle  branch block.  However, code STEMI was called off as the patient had  multiple medical concerns, including acute renal failure (creatinine  1.8), uncertain past medical history, and uncertain mental status.   ALLERGIES:  UNKNOWN.   MEDICATIONS:  Unknown, none reported by family.   PAST MEDICAL HISTORY:  Hypertension.   SOCIAL HISTORY:  The  patient lives with her son.  He is not present to  provide history.  She smokes currently 1-pack per day per her family  account.  Denies any drugs or alcohol per family.   REVIEW OF SYSTEMS:  Could not be obtained.   PHYSICAL EXAMINATION:  VITAL SIGNS:  Pulse was 124, now in the 80s in  sinus rhythm.  Blood pressure while on nitroglycerin drip was 147/80s.  SaO2 is 97% on 40% FIO2 and 10 of PEEP.  BP is symmetric on both sides  by RNs report.  GENERAL:  The patient is intubated.  NECK:  No overt JVD.  HEENT:  Pupils are equal, round and reactive to  light.  ET tube is well placed.  CVS:  Heart rate is normal.  Rhythm is  regular.  S1-S2 noted.  There is a 3/6 murmur heard at the apex.  LUNGS:  Coarse breath sounds are noted bilaterally at the anterior regions.  ABDOMEN:  Soft, nontender.  Otherwise benign.  Bowel sounds are present.  EXTREMITIES:  No clubbing, cyanosis or edema is noted.  Dorsalis pedis  pulses are 2+ and equal in both extremities.  Upper extremity pulses are  symmetric.  NEURO:  Pupils are equal, round  and reactive to light as  above.  However, cannot completely assess.  PSYCH:  Cannot fully assess.  GU:  Deferred.   EKG:  First EKG showed sinus tachycardia with ventricular bigeminy  pattern.  Nonspecific ST-T changes are noted.  No acute ST-T changes are  noted.  Second EKG shows a heart rate of 150.  There is concern for  either SVT with aberrancy versus ventricular tachycardia.  Chest x-ray:  Pulmonary vascular congestion.   LABORATORY DATA:  Currently most labs are pending.  However, at this  time BNP is 932.  CK-MB and troponin are 157, 4.1 and 0.08.  Potassium  is 2.9, creatinine is 1.83.   ASSESSMENT:  A 70 year old African American female with past medical  history notable for hypertension and tobacco use only presents to  [facility] at the request of the emergency department for  further evaluation and management of shortness of breath requiring  intubation.  ECG initially concerning for new left bundle branch block,  but this was in the setting of acute renal failure, intubation and  possible supraventricular tachycardia with aberrancy.  Due to multiple  comorbid conditions, code STEMI called off.   IMPRESSION:  1. Shortness of breath with acute onset.  Blood pressure initially was      250/150 here.  SaO2 was in the 60s requiring intubation.      Unfortunately, no ABG was performed on room air prior to      intubation.  Differential diagnosis for this presentation includes      pulmonary embolus, dissection, acute coronary syndrome, as well as      head bleed.  Currently, I have discussed the utility of noncontrast      imaging to rule out overt head bleed, as well as dissection which      is pending.  However, cannot pursue CT with contrast due to      patient's renal function.  2. EKG/dysrhythmia.  Initial EKG showed sinus tachycardia with a      pattern of bigeminy.  The second EKG presented a new left bundle      branch pattern and possible ventricular  tachycardia versus      supraventricular tachycardia with aberrancy.  For both these EKGs,  code STEMI was initially called.  After discussion with the on-call      attending, code STEMI called off due to multiple comorbid      conditions as described above.  Uncertain as to whether the patient      has an underlying history of coronary artery disease.  Will likely      not initiate hypothermia protocol at this point in time.  3. Acute renal failure.  Potassium 2.9.  Baseline renal function      unknown.  Likely secondary from elevated supraventricular      tachycardia/HR.   PLAN:  1. Admit to CCU under Dr. Eden Emms [facility]).  Obtain TSH,      cardiac BNP, cardiac enzymes x3.  Will likely need a 2-D      echocardiogram in the morning.  Ideally would need a CT to rule out      PE versus dissection.  However, unable to obtain contrast due tp      renal failure. Should non-contrasted head/chest CT rule out      bleed/dissection, PE would be highest on the differential.  Would      order LE u/s and intiate systemic anticoagulation with      unfractionated heparin.  Also needs further hemodynamic assessment      with invasive blood pressure monitoring.  We will consult      respiratory for A line placement.  ED attempting to put in central      line placement.  Pulmonary critical care consult to help manage      vents.  Continue nitroglycerin drip to help control BP.  2. Continue amiodarone drip for now.  Replete K judiciously in the      setting of acute renal failure. Once the patient is somewhat more      stable, may ultimately need a cardiovascular hemodynamic assessment      either with cardiovascular imaging versus cardiac catheterization.  3. Follow BNP.  Renally dose all meds.  Consider renal ultrasound to      rule out obstructive kidney disease, renal disease, UA.  4. Prophylaxis.  The patient is a full code.  Should CT be negative      for dissection, will consider  starting unfractionated heparin.  For      now, SCDs. Contact numbers for her daughters: Nyrie Sigal at [phone], Neriah Brott at [phone] and Donell Beers at [phone].      Mickey Farber, MD  Electronically Signed     SS/MEDQ  D:  12/30/2008  T:  12/30/2008  Job:  [id number]

## 2010-09-28 ENCOUNTER — Ambulatory Visit (INDEPENDENT_AMBULATORY_CARE_PROVIDER_SITE_OTHER): Payer: Medicare Other | Admitting: *Deleted

## 2010-09-28 DIAGNOSIS — I4891 Unspecified atrial fibrillation: Secondary | ICD-10-CM

## 2010-10-12 ENCOUNTER — Encounter: Payer: Medicare Other | Admitting: *Deleted

## 2010-10-12 ENCOUNTER — Other Ambulatory Visit (INDEPENDENT_AMBULATORY_CARE_PROVIDER_SITE_OTHER): Payer: Medicare Other | Admitting: *Deleted

## 2010-10-12 ENCOUNTER — Ambulatory Visit: Payer: Medicare Other | Admitting: Cardiology

## 2010-10-12 DIAGNOSIS — E785 Hyperlipidemia, unspecified: Secondary | ICD-10-CM

## 2010-10-12 LAB — HEPATIC FUNCTION PANEL
ALT: 13 U/L (ref 0–35)
AST: 21 U/L (ref 0–37)
Alkaline Phosphatase: 118 U/L — ABNORMAL HIGH (ref 39–117)
Bilirubin, Direct: 0.1 mg/dL (ref 0.0–0.3)
Total Bilirubin: 0.4 mg/dL (ref 0.3–1.2)

## 2010-10-12 LAB — LIPID PANEL
LDL Cholesterol: 74 mg/dL (ref 0–99)
Total CHOL/HDL Ratio: 2

## 2010-10-13 ENCOUNTER — Encounter: Payer: Self-pay | Admitting: *Deleted

## 2010-10-18 ENCOUNTER — Other Ambulatory Visit: Payer: Self-pay | Admitting: Cardiology

## 2011-02-01 ENCOUNTER — Other Ambulatory Visit: Payer: Self-pay | Admitting: *Deleted

## 2011-02-01 MED ORDER — METOPROLOL TARTRATE 50 MG PO TABS: 50.0000 mg | ORAL_TABLET | Freq: Two times a day (BID) | ORAL | Status: DC

## 2011-02-04 ENCOUNTER — Other Ambulatory Visit: Payer: Self-pay | Admitting: *Deleted

## 2011-02-04 DIAGNOSIS — I10 Essential (primary) hypertension: Secondary | ICD-10-CM

## 2011-02-04 MED ORDER — PANTOPRAZOLE SODIUM 40 MG PO TBEC: 40.0000 mg | DELAYED_RELEASE_TABLET | Freq: Every day | ORAL | Status: DC

## 2011-02-17 ENCOUNTER — Other Ambulatory Visit: Payer: Self-pay | Admitting: *Deleted

## 2011-02-17 MED ORDER — FERROUS SULFATE 325 (65 FE) MG PO TABS: 325.0000 mg | ORAL_TABLET | Freq: Two times a day (BID) | ORAL | Status: DC

## 2011-03-29 ENCOUNTER — Ambulatory Visit (INDEPENDENT_AMBULATORY_CARE_PROVIDER_SITE_OTHER): Payer: Medicare Other | Admitting: *Deleted

## 2011-03-29 DIAGNOSIS — I4891 Unspecified atrial fibrillation: Secondary | ICD-10-CM

## 2011-03-29 MED ORDER — WARFARIN SODIUM 7.5 MG PO TABS: 7.5000 mg | ORAL_TABLET | Freq: Every day | ORAL | Status: DC

## 2011-04-07 ENCOUNTER — Ambulatory Visit (INDEPENDENT_AMBULATORY_CARE_PROVIDER_SITE_OTHER): Payer: Medicare Other | Admitting: *Deleted

## 2011-04-07 DIAGNOSIS — I4891 Unspecified atrial fibrillation: Secondary | ICD-10-CM

## 2011-04-14 ENCOUNTER — Encounter: Payer: Medicare Other | Admitting: *Deleted

## 2011-04-18 ENCOUNTER — Other Ambulatory Visit: Payer: Self-pay | Admitting: Cardiology

## 2011-04-27 ENCOUNTER — Other Ambulatory Visit: Payer: Self-pay | Admitting: Cardiology

## 2011-04-28 ENCOUNTER — Encounter: Payer: Self-pay | Admitting: Cardiology

## 2011-04-29 ENCOUNTER — Ambulatory Visit (INDEPENDENT_AMBULATORY_CARE_PROVIDER_SITE_OTHER): Payer: Medicare Other | Admitting: Cardiology

## 2011-04-29 ENCOUNTER — Encounter: Payer: Self-pay | Admitting: Cardiology

## 2011-04-29 ENCOUNTER — Ambulatory Visit (INDEPENDENT_AMBULATORY_CARE_PROVIDER_SITE_OTHER): Payer: Medicare Other | Admitting: *Deleted

## 2011-04-29 VITALS — BP 134/85 | HR 63 | Resp 16 | Ht 63.0 in | Wt 190.0 lb

## 2011-04-29 DIAGNOSIS — I517 Cardiomegaly: Secondary | ICD-10-CM

## 2011-04-29 DIAGNOSIS — N939 Abnormal uterine and vaginal bleeding, unspecified: Secondary | ICD-10-CM

## 2011-04-29 DIAGNOSIS — N289 Disorder of kidney and ureter, unspecified: Secondary | ICD-10-CM

## 2011-04-29 DIAGNOSIS — I4891 Unspecified atrial fibrillation: Secondary | ICD-10-CM

## 2011-04-29 DIAGNOSIS — F17200 Nicotine dependence, unspecified, uncomplicated: Secondary | ICD-10-CM

## 2011-04-29 DIAGNOSIS — I428 Other cardiomyopathies: Secondary | ICD-10-CM

## 2011-04-29 DIAGNOSIS — I10 Essential (primary) hypertension: Secondary | ICD-10-CM

## 2011-04-29 DIAGNOSIS — Z79899 Other long term (current) drug therapy: Secondary | ICD-10-CM

## 2011-04-29 LAB — CBC WITH DIFFERENTIAL/PLATELET
Basophils Absolute: 0 10*3/uL (ref 0.0–0.1)
Basophils Relative: 0.4 % (ref 0.0–3.0)
HCT: 33.6 % — ABNORMAL LOW (ref 36.0–46.0)
Hemoglobin: 11.2 g/dL — ABNORMAL LOW (ref 12.0–15.0)
Lymphs Abs: 1.4 10*3/uL (ref 0.7–4.0)
MCHC: 33.3 g/dL (ref 30.0–36.0)
Monocytes Relative: 7.6 % (ref 3.0–12.0)
Neutro Abs: 5.2 10*3/uL (ref 1.4–7.7)
RBC: 4.2 Mil/uL (ref 3.87–5.11)
RDW: 17.9 % — ABNORMAL HIGH (ref 11.5–14.6)

## 2011-04-29 LAB — BASIC METABOLIC PANEL
BUN: 21 mg/dL (ref 6–23)
CO2: 26 mEq/L (ref 19–32)
Calcium: 11.2 mg/dL — ABNORMAL HIGH (ref 8.4–10.5)
Creatinine, Ser: 1.9 mg/dL — ABNORMAL HIGH (ref 0.4–1.2)

## 2011-04-29 LAB — POCT INR: INR: 4.2

## 2011-04-29 NOTE — Assessment & Plan Note (Signed)
The blood pressure is at target. No change in medications is indicated. We will continue with therapeutic lifestyle changes (TLC).  

## 2011-04-29 NOTE — Patient Instructions (Signed)
Please have blood work today  (CBC)  The current medical regimen is effective;  continue present plan and medications.  Your physician has requested that you have an echocardiogram. Echocardiography is a painless test that uses sound waves to create images of your heart. It provides your doctor with information about the size and shape of your heart and how well your heart's chambers and valves are working. This procedure takes approximately one hour. There are no restrictions for this procedure.  Follow up in 6 months with Dr Antoine Poche.  You will receive a letter in the mail 2 months before you are due.  Please call us when you receive this letter to schedule your follow up appointment.

## 2011-04-29 NOTE — Assessment & Plan Note (Signed)
I will repeat an echocardiogram to follow this.  I reiterated salt restriction.  Otherwise and not suggesting that she will need any change in her medications.

## 2011-04-29 NOTE — Progress Notes (Signed)
HPI The patient presents for followup of her atrial arrhythmia and LVH. Since I last saw her she has done well. She will occasionally get some wheezing but this goes away quickly. She otherwise doesn't describe any shortness of breath, PND or orthopnea. She doesn't have any chest pressure, neck or arm discomfort. She doesn't report palpitations, presyncope or syncope. She certainly has had none of the events that she had at the time of a bradycardic arrest in the hospital.  No Known Allergies  Current Outpatient Prescriptions  Medication Sig Dispense Refill  . ferrous sulfate 325 (65 FE) MG tablet Take 1 tablet (325 mg total) by mouth 2 (two) times daily.  60 tablet  1  . furosemide (LASIX) 20 MG tablet TAKE 1 TABLET BY MOUTH EVERY DAY  30 tablet  6  . metoprolol (LOPRESSOR) 50 MG tablet Take 1 tablet (50 mg total) by mouth 2 (two) times daily.  60 tablet  5  . nitroGLYCERIN (NITROSTAT) 0.4 MG SL tablet Place 0.4 mg under the tongue every 5 (five) minutes as needed.        . pantoprazole (PROTONIX) 40 MG tablet Take 1 tablet (40 mg total) by mouth daily.  90 tablet  3  . rosuvastatin (CRESTOR) 10 MG tablet Take 1 tablet (10 mg total) by mouth at bedtime.  30 tablet  6  . warfarin (COUMADIN) 7.5 MG tablet TAKE 1 TABLET BY MOUTH DAILY.  30 tablet  0    Past Medical History  Diagnosis Date  . CAD (coronary artery disease)     cath 11/11: LAD 40%, mid-dist 25-30%; prox CFX 30%, mid 40%; OM 50%; PDA 50%; PLV 50%;  EF 65%  . Acute myocardial infarction     in setting of AFib with RVR 03/2010  . Atrial fibrillation     coumadin  . HOCM (hypertrophic obstructive cardiomyopathy)     Echo 03/2010: with peak gradient of Hg, ejection fraction (EF) 65-70%; mod LVH; mod LAE  . Hypertension   . Tobacco abuse   . Chronic kidney disease     Stage 4  . Iron deficiency     anemia  . CHF (congestive heart failure)     preserved LVF  . Iron deficiency anemia   . Third degree uterine prolapse    . Hx MRSA infection     No past surgical history on file.  ROS:  She describes some vaginal spotting.  Otherwise, as stated in the HPI and negative for all other systems.  PHYSICAL EXAM BP 134/85  Pulse 63  Resp 16  Ht 5\' 3"  (1.6 m)  Wt 190 lb (86.183 kg)  BMI 33.66 kg/m2 GENERAL:  Well appearing HEENT:  Pupils equal round and reactive, fundi not visualized, oral mucosa unremarkable NECK:  No jugular venous distention, waveform within normal limits, carotid upstroke brisk and symmetric, no bruits, no thyromegaly LYMPHATICS:  No cervical, inguinal adenopathy LUNGS:  Clear to auscultation bilaterally BACK:  No CVA tenderness CHEST:  Unremarkable HEART:  PMI not displaced or sustained,S1 and S2 within normal limits, no S3, postive S4 no clicks, no rubs, slight apical systolic murmur ABD:  Flat, positive bowel sounds normal in frequency in pitch, no bruits, no rebound, no guarding, no midline pulsatile mass, no hepatomegaly, no splenomegaly EXT:  2 plus pulses throughout, no edema, no cyanosis no clubbing SKIN:  No rashes no nodules NEURO:  Cranial nerves II through XII grossly intact, motor grossly intact throughout PSYCH:  Cognitively intact, oriented  to person place and time  EKG:  NSR, LVH with repolarization.  Rate 63.  No change.  04/29/2011  ASSESSMENT AND PLAN

## 2011-04-29 NOTE — Assessment & Plan Note (Signed)
I will check a basic metabolic profile. She needs a primary care doctor.

## 2011-04-29 NOTE — Assessment & Plan Note (Signed)
Only smokes a couple per week.  We discussed a specific strategy for tobacco cessation.  (Greater than three minutes discussing tobacco cessation.)

## 2011-05-05 ENCOUNTER — Other Ambulatory Visit: Payer: Self-pay | Admitting: Cardiology

## 2011-05-05 NOTE — Telephone Encounter (Signed)
Pt has script she picked up from

## 2011-05-06 ENCOUNTER — Ambulatory Visit (HOSPITAL_COMMUNITY): Payer: Medicare Other | Attending: Internal Medicine | Admitting: Radiology

## 2011-05-06 DIAGNOSIS — I059 Rheumatic mitral valve disease, unspecified: Secondary | ICD-10-CM | POA: Insufficient documentation

## 2011-05-06 DIAGNOSIS — E785 Hyperlipidemia, unspecified: Secondary | ICD-10-CM | POA: Insufficient documentation

## 2011-05-06 DIAGNOSIS — I4891 Unspecified atrial fibrillation: Secondary | ICD-10-CM | POA: Insufficient documentation

## 2011-05-06 DIAGNOSIS — I517 Cardiomegaly: Secondary | ICD-10-CM

## 2011-05-06 DIAGNOSIS — I079 Rheumatic tricuspid valve disease, unspecified: Secondary | ICD-10-CM | POA: Insufficient documentation

## 2011-05-06 DIAGNOSIS — F17200 Nicotine dependence, unspecified, uncomplicated: Secondary | ICD-10-CM | POA: Insufficient documentation

## 2011-05-06 DIAGNOSIS — I10 Essential (primary) hypertension: Secondary | ICD-10-CM | POA: Insufficient documentation

## 2011-05-08 ENCOUNTER — Other Ambulatory Visit: Payer: Self-pay | Admitting: Cardiology

## 2011-05-13 ENCOUNTER — Ambulatory Visit (INDEPENDENT_AMBULATORY_CARE_PROVIDER_SITE_OTHER): Payer: Medicare Other | Admitting: *Deleted

## 2011-05-13 DIAGNOSIS — I4891 Unspecified atrial fibrillation: Secondary | ICD-10-CM

## 2011-05-18 ENCOUNTER — Other Ambulatory Visit: Payer: Self-pay | Admitting: Cardiology

## 2011-06-03 ENCOUNTER — Emergency Department (HOSPITAL_COMMUNITY)
Admission: EM | Admit: 2011-06-03 | Discharge: 2011-06-03 | Disposition: A | Discharge status: Home / Self Care | Payer: Medicare Other | Attending: Emergency Medicine | Admitting: Emergency Medicine

## 2011-06-03 ENCOUNTER — Encounter (HOSPITAL_COMMUNITY): Payer: Self-pay | Admitting: Emergency Medicine

## 2011-06-03 ENCOUNTER — Emergency Department (HOSPITAL_COMMUNITY): Payer: Medicare Other

## 2011-06-03 ENCOUNTER — Other Ambulatory Visit: Payer: Self-pay

## 2011-06-03 DIAGNOSIS — R0602 Shortness of breath: Secondary | ICD-10-CM | POA: Insufficient documentation

## 2011-06-03 DIAGNOSIS — I252 Old myocardial infarction: Secondary | ICD-10-CM | POA: Insufficient documentation

## 2011-06-03 DIAGNOSIS — Z79899 Other long term (current) drug therapy: Secondary | ICD-10-CM | POA: Insufficient documentation

## 2011-06-03 DIAGNOSIS — N184 Chronic kidney disease, stage 4 (severe): Secondary | ICD-10-CM | POA: Insufficient documentation

## 2011-06-03 DIAGNOSIS — I129 Hypertensive chronic kidney disease with stage 1 through stage 4 chronic kidney disease, or unspecified chronic kidney disease: Secondary | ICD-10-CM | POA: Insufficient documentation

## 2011-06-03 DIAGNOSIS — Z7901 Long term (current) use of anticoagulants: Secondary | ICD-10-CM | POA: Insufficient documentation

## 2011-06-03 DIAGNOSIS — I2510 Atherosclerotic heart disease of native coronary artery without angina pectoris: Secondary | ICD-10-CM | POA: Insufficient documentation

## 2011-06-03 DIAGNOSIS — I4891 Unspecified atrial fibrillation: Secondary | ICD-10-CM | POA: Insufficient documentation

## 2011-06-03 DIAGNOSIS — I509 Heart failure, unspecified: Secondary | ICD-10-CM | POA: Insufficient documentation

## 2011-06-03 LAB — BASIC METABOLIC PANEL WITH GFR
BUN: 17 mg/dL (ref 6–23)
CO2: 24 meq/L (ref 19–32)
Calcium: 12.3 mg/dL — ABNORMAL HIGH (ref 8.4–10.5)
Chloride: 104 meq/L (ref 96–112)
Creatinine, Ser: 1.52 mg/dL — ABNORMAL HIGH (ref 0.50–1.10)
GFR calc Af Amer: 39 mL/min — ABNORMAL LOW
GFR calc non Af Amer: 34 mL/min — ABNORMAL LOW
Glucose, Bld: 136 mg/dL — ABNORMAL HIGH (ref 70–99)
Potassium: 4.4 meq/L (ref 3.5–5.1)
Sodium: 137 meq/L (ref 135–145)

## 2011-06-03 LAB — CBC
HCT: 35.3 % — ABNORMAL LOW (ref 36.0–46.0)
Hemoglobin: 11.4 g/dL — ABNORMAL LOW (ref 12.0–15.0)
MCH: 26.3 pg (ref 26.0–34.0)
MCHC: 32.3 g/dL (ref 30.0–36.0)
MCV: 81.3 fL (ref 78.0–100.0)
Platelets: 245 K/uL (ref 150–400)
RBC: 4.34 MIL/uL (ref 3.87–5.11)
RDW: 17 % — ABNORMAL HIGH (ref 11.5–15.5)
WBC: 9.4 K/uL (ref 4.0–10.5)

## 2011-06-03 LAB — DIFFERENTIAL
Basophils Absolute: 0 K/uL (ref 0.0–0.1)
Basophils Relative: 0 % (ref 0–1)
Eosinophils Absolute: 0.1 K/uL (ref 0.0–0.7)
Eosinophils Relative: 2 % (ref 0–5)
Lymphocytes Relative: 18 % (ref 12–46)
Lymphs Abs: 1.7 K/uL (ref 0.7–4.0)
Monocytes Absolute: 0.5 K/uL (ref 0.1–1.0)
Monocytes Relative: 5 % (ref 3–12)
Neutro Abs: 7 K/uL (ref 1.7–7.7)
Neutrophils Relative %: 75 % (ref 43–77)

## 2011-06-03 LAB — PRO B NATRIURETIC PEPTIDE: Pro B Natriuretic peptide (BNP): 959.5 pg/mL — ABNORMAL HIGH (ref 0–125)

## 2011-06-03 LAB — CK TOTAL AND CKMB (NOT AT ARMC)
CK, MB: 4.2 ng/mL — ABNORMAL HIGH (ref 0.3–4.0)
Relative Index: 2.7 — ABNORMAL HIGH (ref 0.0–2.5)
Total CK: 155 U/L (ref 7–177)

## 2011-06-03 LAB — TROPONIN I: Troponin I: <0.3 ng/mL

## 2011-06-03 NOTE — ED Notes (Signed)
Pt c/o SOB that has resolved at this time

## 2011-06-03 NOTE — ED Provider Notes (Signed)
History     CSN: [id number]  Arrival date & time 06/03/11  0101   First MD Initiated Contact with Patient 06/03/11 0113      Chief Complaint  Patient presents with  . Shortness of Breath    (Consider location/radiation/quality/duration/timing/severity/associated sxs/prior treatment) HPI Comments: The patient awoke to have shortness of breath that lasted less than 10 minutes this evening, relieved by nothing in particular, and has not recurred. The patient denies chest pain, palpitations, orthopnea, lower extremity edema, fever, chills, or cough. At the time of evaluation she denies shortness of breath and is in no apparent distress.  Patient is a 71 y.o. female presenting with shortness of breath. The history is provided by the patient and a relative.  Shortness of Breath  The current episode started today. The onset was sudden. The problem occurs rarely. The problem has been resolved. The problem is moderate. The symptoms are relieved by nothing. The symptoms are aggravated by nothing. Associated symptoms include shortness of breath. Pertinent negatives include no chest pain, no chest pressure, no orthopnea, no fever, no rhinorrhea, no sore throat, no stridor, no cough and no wheezing. There was no intake of a foreign body. She was not exposed to toxic fumes. She has not inhaled smoke recently. Past medical history comments: Congestive heart failure. She has been behaving normally. Urine output has been normal. There were no sick contacts.    Past Medical History  Diagnosis Date  . CAD (coronary artery disease)     cath 11/11: LAD 40%, mid-dist 25-30%; prox CFX 30%, mid 40%; OM 50%; PDA 50%; PLV 50%;  EF 65%  . Acute myocardial infarction     in setting of AFib with RVR 03/2010  . Atrial fibrillation     coumadin  . HOCM (hypertrophic obstructive cardiomyopathy)     Echo 03/2010: with peak gradient of Hg, ejection fraction (EF) 65-70%; mod LVH; mod LAE  . Hypertension   .  Tobacco abuse   . Chronic kidney disease     Stage 4  . Iron deficiency     anemia  . CHF (congestive heart failure)     preserved LVF  . Iron deficiency anemia   . Third degree uterine prolapse   . Hx MRSA infection     History reviewed. No pertinent past surgical history.  Family History  Problem Relation Age of Onset  . Coronary artery disease Neg Hx   . Atrial fibrillation Neg Hx     History  Substance Use Topics  . Smoking status: Current Everyday Smoker  . Smokeless tobacco: Not on file  . Alcohol Use: No    OB History    Grav Para Term Preterm Abortions TAB SAB Ect Mult Living                  Review of Systems  Constitutional: Negative for fever, chills, activity change, appetite change, fatigue and unexpected weight change.  HENT: Negative for congestion, sore throat, facial swelling and rhinorrhea.   Eyes: Negative.   Respiratory: Positive for shortness of breath. Negative for cough, chest tightness, wheezing and stridor.   Cardiovascular: Negative for chest pain, palpitations, orthopnea and leg swelling.  Gastrointestinal: Negative for nausea, vomiting, abdominal pain and abdominal distention.  Genitourinary: Negative for dysuria, urgency, frequency and hematuria.  Musculoskeletal: Negative for back pain and joint swelling.  Skin: Negative for color change and rash.  Neurological: Negative for dizziness, syncope, numbness and headaches.  Hematological: Negative for adenopathy.  Psychiatric/Behavioral: Negative.     Allergies  Review of patient's allergies indicates no known allergies.  Home Medications   Current Outpatient Rx  Name Route Sig Dispense Refill  . FERROUS SULFATE 325 (65 FE) MG PO TABS  TAKE 1 TABLET BY MOUTH TWICE A DAY 60 tablet 1  . FUROSEMIDE 20 MG PO TABS  TAKE 1 TABLET BY MOUTH EVERY DAY 30 tablet 6  . METOPROLOL TARTRATE 50 MG PO TABS Oral Take 1 tablet (50 mg total) by mouth 2 (two) times daily. 60 tablet 5  . NITROGLYCERIN 0.4  MG SL SUBL Sublingual Place 0.4 mg under the tongue every 5 (five) minutes as needed.      Marland Kitchen PANTOPRAZOLE SODIUM 40 MG PO TBEC Oral Take 1 tablet (40 mg total) by mouth daily. 90 tablet 3  . ROSUVASTATIN CALCIUM 10 MG PO TABS Oral Take 1 tablet (10 mg total) by mouth at bedtime. 30 tablet 6  . WARFARIN SODIUM 7.5 MG PO TABS  TAKE 1 TABLET BY MOUTH DAILY. 35 tablet 0    BP 160/93  Pulse 78  Temp 99.3 F (37.4 C)  Resp 17  SpO2 97%  Physical Exam  Nursing note and vitals reviewed. Constitutional: She is oriented to person, place, and time. She appears well-developed and well-nourished. No distress.  HENT:  Head: Normocephalic and atraumatic.  Mouth/Throat: Oropharynx is clear and moist.  Eyes: EOM are normal. Pupils are equal, round, and reactive to light.  Neck: Normal range of motion. Neck supple. No JVD present. No tracheal deviation present.  Cardiovascular: Normal rate, regular rhythm, normal heart sounds and intact distal pulses.   No extrasystoles are present. Exam reveals no gallop, no S3, no S4 and no friction rub.   No murmur heard. Pulmonary/Chest: Breath sounds normal. No accessory muscle usage or stridor. Not tachypneic. No respiratory distress. She has no wheezes. She has no rales. She exhibits no tenderness.  Abdominal: Soft. Bowel sounds are normal. She exhibits no distension. There is no tenderness.  Musculoskeletal: Normal range of motion. She exhibits no edema and no tenderness.  Neurological: She is alert and oriented to person, place, and time. She has normal reflexes. No cranial nerve deficit. Coordination normal.  Skin: Skin is warm and dry. No rash noted. She is not diaphoretic. No erythema. No pallor.  Psychiatric: She has a normal mood and affect. Her behavior is normal. Judgment and thought content normal.    ED Course  Procedures (including critical care time)   Date: 06/03/2011  Rate: 80  Rhythm: normal sinus rhythm  QRS Axis: normal  Intervals:  normal  ST/T Wave abnormalities: nonspecific ST/T changes and Left ventricular hypertrophy with repolarization abnormality  Conduction Disutrbances:none  Narrative Interpretation: No significant changes from the tracing obtained on 04/29/2011, and in light of this this is a non-provocative EKG  Old EKG Reviewed: unchanged   Labs Reviewed  CBC - Abnormal; Notable for the following:    Hemoglobin 11.4 (*)    HCT 35.3 (*)    RDW 17.0 (*)    All other components within normal limits  BASIC METABOLIC PANEL - Abnormal; Notable for the following:    Glucose, Bld 136 (*)    Creatinine, Ser 1.52 (*)    Calcium 12.3 (*)    GFR calc non Af Amer 34 (*)    GFR calc Af Amer 39 (*)    All other components within normal limits  PRO B NATRIURETIC PEPTIDE - Abnormal; Notable for the following:  Pro B Natriuretic peptide (BNP) 959.5 (*)    All other components within normal limits  CK TOTAL AND CKMB - Abnormal; Notable for the following:    CK, MB 4.2 (*)    Relative Index 2.7 (*)    All other components within normal limits  DIFFERENTIAL  TROPONIN I  CK TOTAL AND CKMB   Dg Chest 2 View  06/03/2011  *RADIOLOGY REPORT*  Clinical Data: Shortness of breath.  CHEST - 2 VIEW  Comparison: Chest radiograph performed 07/22/2010  Findings: The lungs are hyperexpanded, with flattening of the hemidiaphragms, compatible with COPD.  Chronically increased interstitial markings are noted.  Underlying vascular congestion is seen.  Mild basilar opacity is suggested on the lateral view; mild pneumonia cannot be excluded.  There is no evidence of pleural effusion or pneumothorax.  The heart is mildly enlarged.  No acute osseous abnormalities are seen.  IMPRESSION:  1.  Mild basilar opacity suggested on the lateral view, not well characterized on the frontal view; mild pneumonia cannot be excluded. 2.  Findings of COPD; chronic vascular congestion and mild cardiomegaly noted.  Original Report Authenticated By: Tonia Ghent, M.D.     No diagnosis found.    MDM  No signs or symptoms currently of acute CHF. No indication of acute myocardial infarction by symptoms, examination, or laboratory results. The CK-MB was borderline elevated and this is nonspecific. The troponin is negative. The patient has had no chest pain and is having no ongoing symptoms. Acute coronary syndrome or acute myocardial infarction is not thought likely. Patient's lungs are clear to examination in all fields, and the findings on x-ray do not correlate to clinical picture of pneumonia. The patient appears stable for discharge home at this time.       Felisa Bonier, MD 06/03/11 [phone]

## 2011-06-09 ENCOUNTER — Other Ambulatory Visit: Payer: Self-pay | Admitting: *Deleted

## 2011-06-09 DIAGNOSIS — E785 Hyperlipidemia, unspecified: Secondary | ICD-10-CM

## 2011-06-09 MED ORDER — ROSUVASTATIN CALCIUM 10 MG PO TABS: 10.0000 mg | ORAL_TABLET | Freq: Every day | ORAL | Status: DC

## 2011-06-10 ENCOUNTER — Encounter: Payer: Medicare Other | Admitting: *Deleted

## 2011-06-10 ENCOUNTER — Other Ambulatory Visit: Payer: Self-pay | Admitting: *Deleted

## 2011-07-03 ENCOUNTER — Encounter: Payer: Self-pay | Admitting: Internal Medicine

## 2011-07-03 DIAGNOSIS — Z0000 Encounter for general adult medical examination without abnormal findings: Secondary | ICD-10-CM | POA: Insufficient documentation

## 2011-07-08 ENCOUNTER — Ambulatory Visit: Payer: Medicare Other | Admitting: Internal Medicine

## 2011-07-08 DIAGNOSIS — Z0279 Encounter for issue of other medical certificate: Secondary | ICD-10-CM

## 2011-07-27 ENCOUNTER — Other Ambulatory Visit: Payer: Self-pay | Admitting: Cardiology

## 2011-08-03 ENCOUNTER — Ambulatory Visit (INDEPENDENT_AMBULATORY_CARE_PROVIDER_SITE_OTHER): Payer: Medicare Other | Admitting: *Deleted

## 2011-08-03 DIAGNOSIS — I4891 Unspecified atrial fibrillation: Secondary | ICD-10-CM

## 2011-08-03 LAB — POCT INR: INR: 2.1

## 2011-08-03 MED ORDER — WARFARIN SODIUM 7.5 MG PO TABS: ORAL_TABLET | ORAL | Status: DC

## 2011-08-10 ENCOUNTER — Ambulatory Visit: Payer: Medicare Other | Admitting: Physician Assistant

## 2011-08-16 ENCOUNTER — Ambulatory Visit: Payer: Medicare Other | Admitting: Physician Assistant

## 2011-08-17 ENCOUNTER — Ambulatory Visit (INDEPENDENT_AMBULATORY_CARE_PROVIDER_SITE_OTHER): Payer: Medicare Other | Admitting: Physician Assistant

## 2011-08-17 ENCOUNTER — Encounter: Payer: Self-pay | Admitting: Physician Assistant

## 2011-08-17 VITALS — BP 145/95 | HR 58 | Ht 63.0 in | Wt 192.1 lb

## 2011-08-17 DIAGNOSIS — I4891 Unspecified atrial fibrillation: Secondary | ICD-10-CM

## 2011-08-17 DIAGNOSIS — I509 Heart failure, unspecified: Secondary | ICD-10-CM

## 2011-08-17 DIAGNOSIS — N185 Chronic kidney disease, stage 5: Secondary | ICD-10-CM

## 2011-08-17 DIAGNOSIS — R0602 Shortness of breath: Secondary | ICD-10-CM

## 2011-08-17 DIAGNOSIS — I5032 Chronic diastolic (congestive) heart failure: Secondary | ICD-10-CM

## 2011-08-17 DIAGNOSIS — I10 Essential (primary) hypertension: Secondary | ICD-10-CM

## 2011-08-17 DIAGNOSIS — I2510 Atherosclerotic heart disease of native coronary artery without angina pectoris: Secondary | ICD-10-CM | POA: Insufficient documentation

## 2011-08-17 DIAGNOSIS — F17200 Nicotine dependence, unspecified, uncomplicated: Secondary | ICD-10-CM

## 2011-08-17 DIAGNOSIS — I428 Other cardiomyopathies: Secondary | ICD-10-CM

## 2011-08-17 LAB — BASIC METABOLIC PANEL
BUN: 15 mg/dL (ref 6–23)
CO2: 26 mEq/L (ref 19–32)
Calcium: 11.3 mg/dL — ABNORMAL HIGH (ref 8.4–10.5)
Chloride: 106 mEq/L (ref 96–112)
Creatinine, Ser: 1.6 mg/dL — ABNORMAL HIGH (ref 0.4–1.2)
Glucose, Bld: 132 mg/dL — ABNORMAL HIGH (ref 70–99)

## 2011-08-17 MED ORDER — FUROSEMIDE 20 MG PO TABS: 20.0000 mg | ORAL_TABLET | Freq: Every day | ORAL | Status: DC

## 2011-08-17 NOTE — Progress Notes (Addendum)
[address] [place], [place]  [address] Phone: [phone] Fax:  [phone]  Date:  08/17/2011   Name:  [name]       DOB:  [birth year] MRN:  [id number]  PCP:  Dr. Jonny Ruiz  Primary Cardiologist:  Dr. Rollene Rotunda  Primary Electrophysiologist:  None    History of Present Illness: [name] is a 71 y.o. female who returns for follow up.    She has a h/o HOCM, diastolic CHF and AFib with RVR in 03/2010 with assoc. NSTEMI. Cath at that time demonstrated nonobstructive CAD. Her hospitalization was c/b bradycardic arrest and prolonged hospitalization.   Last seen by Dr. Rollene Rotunda 04/2011.  Follow up echo 05/06/11:  Severe LVH, EF 65-70%, dynamic obstruction mid cavity with mid cavity obliteration, peak gradient 75 mmHg, no change with valsalva, grade 2 diastolic dysfunction, no SAM, mod LAE, PASP 36.  She notes DOE.  This is fairly chronic.  However she seems to be describing worsening symptoms over the last several mos.  She has started to use a scooter at the grocery store.  She is likely NYHA Class 3.  She has 2 pillow orthopnea that is unchanged.  Denies PND. No significant LE edema.  No significant wt change at home.  She is up 2 pounds here.  No chest pain.  No syncope.  Of note, she was in the ED in 05/2011 for dyspnea.  CXR notable for COPD, chronic vascular congestion.   Labs are below:  Pro B Natriuretic peptide (BNP)  Date/Time Value Range Status  06/03/2011  1:29 AM 959.5* 0-125 (pg/mL) Final     BUN  Date/Time Value Range Status  06/03/2011  1:16 AM 17  6-23 (mg/dL) Final     Creatinine, Ser  Date/Time Value Range Status  06/03/2011  1:16 AM 1.52* 0.50-1.10 (mg/dL) Final     Potassium  Date/Time Value Range Status  06/03/2011  1:16 AM 4.4  3.5-5.1 (mEq/L) Final     Past Medical History  Diagnosis Date  . CAD (coronary artery disease)     cath 11/11: LAD 40%, mid-dist 25-30%; prox CFX 30%, mid 40%; OM 50%; PDA 50%; PLV 50%;   EF 65%  . Acute myocardial infarction     in setting of AFib with RVR 03/2010  . Atrial fibrillation     coumadin  . HOCM (hypertrophic obstructive cardiomyopathy)     Echo 03/2010: with peak gradient of Hg, ejection fraction (EF) 65-70%; mod LVH; mod LAE;  echo 05/06/11:  Severe LVH, EF 65-70%, dynamic obstruction mid cavity with mid cavity obliteration, peak gradient 75 mmHg, no change with valsalva, grade 2 diastolic dysfunction, no SAM, mod LAE, PASP 36  . Hypertension   . Tobacco abuse   . Chronic kidney disease     Stage 4  . Iron deficiency     anemia  . CHF (congestive heart failure)     preserved LVF  . Iron deficiency anemia   . Third degree uterine prolapse   . Hx MRSA infection     Current Outpatient Prescriptions  Medication Sig Dispense Refill  . ferrous sulfate 325 (65 FE) MG tablet TAKE 1 TABLET BY MOUTH TWICE A DAY  60 tablet  1  . furosemide (LASIX) 20 MG tablet TAKE 1 TABLET BY MOUTH EVERY DAY  30 tablet  6  . metoprolol (LOPRESSOR) 50 MG tablet Take 1 tablet (50 mg total) by mouth 2 (two) times daily.  60  tablet  5  . nitroGLYCERIN (NITROSTAT) 0.4 MG SL tablet Place 0.4 mg under the tongue every 5 (five) minutes as needed.        . rosuvastatin (CRESTOR) 10 MG tablet Take 1 tablet (10 mg total) by mouth at bedtime.  30 tablet  6  . warfarin (COUMADIN) 7.5 MG tablet Take as directed by [facility]  35 tablet  0  . pantoprazole (PROTONIX) 40 MG tablet Take 1 tablet (40 mg total) by mouth daily.  90 tablet  3    Allergies: No Known Allergies  History  Substance Use Topics  . Smoking status: Current Everyday Smoker  . Smokeless tobacco: Not on file  . Alcohol Use: No     ROS:  Please see the history of present illness.   No cough.  No fevers or chills.  No vomiting or diarrhea.  No melena, hematochezia.   All other systems reviewed and negative.   PHYSICAL EXAM: VS:  BP 145/95  Pulse 58  Ht 5\' 3"  (1.6 m)  Wt 192 lb 1.9 oz (87.145 kg)  BMI  34.03 kg/m2 Well nourished, well developed, in no acute distress HEENT: normal Neck: + (minimal) JVD Cardiac:  normal S1, S2; RRR; 2/6 systolic murmur along LSB, no S3 Lungs:  Crackles in the bases bilaterally, no wheezing, rhonchi   Abd: soft, nontender, no hepatomegaly Ext: trace bilat LE edema Skin: warm and dry Neuro:  CNs 2-12 intact, no focal abnormalities noted  EKG:  Sinus brady, HR 58, LVH with repol abnormality  ASSESSMENT AND PLAN:  1. Shortness of breath  This is chronic and likely multifactorial.  However, on exam, she seems to be mildly volume overloaded.  Crackles in the bases of her lungs on exam may be from chronic scarring from COPD or possibly edema.  She is not taking furosemide.  She did have an elevated BNP when she was in the emergency room in January.  She is somewhat vague about her symptoms.  However, it sounds as though her symptoms have worsened over the last several months.  I will place her on low dose Lasix 20 mg daily.  Check a basic metabolic panel and BNP today.  Repeat a basic metabolic panel at the end of this week.  Followup in the next 2 weeks with either Dr. Antoine Poche or me.   2. CARDIOMYOPATHY, IDIOPATHIC HYPERTROPHIC  Echo stable December 2012.   3. Chronic diastolic heart failure  Adjust diuresis as noted above   4. HYPERTENSION  Elevated.  I will see how her blood pressure responds to adding Lasix.   5. FIBRILLATION, ATRIAL  Maintaining sinus rhythm.  She remains on Coumadin.   6. CHRONIC KIDNEY DISEASE STAGE V  Recheck a basic metabolic panel today to keep a close eye on her renal function in the context of CKD as well as HOCM.   7. CAD  No angina.  She is not on aspirin due to Coumadin.  She remains on statin therapy.    8.  Tobacco Abuse       We discussed the importance of cessation and different strategies for quitting.      Luna Glasgow, PA-C  9:49 AM 08/17/2011

## 2011-08-17 NOTE — Patient Instructions (Addendum)
Your physician recommends that you schedule a follow-up appointment in: 2 weeks  Your physician recommends that you have lab work drawn today (BMP & BNP) and Friday (BMP) Your physician has recommended you make the following change in your medication: RESTART Furosemide 20 mg daily

## 2011-08-18 ENCOUNTER — Telehealth: Payer: Self-pay | Admitting: *Deleted

## 2011-08-18 DIAGNOSIS — I5032 Chronic diastolic (congestive) heart failure: Secondary | ICD-10-CM

## 2011-08-18 NOTE — Telephone Encounter (Signed)
pt notified of lab results today and to take lasix qod and will have repeat bmet 08/26/11. Danielle Rankin bmet order placed today.

## 2011-08-18 NOTE — Telephone Encounter (Signed)
Message copied by Tarri Fuller on Wed Aug 18, 2011  2:41 PM ------      Message from: [place], [place] T      Created: Tue Aug 17, 2011  5:35 PM       Take the Lasix every other day instead of daily and repeat bmet in one week      Tereso Newcomer, PA-C  5:35 PM 08/17/2011

## 2011-08-20 ENCOUNTER — Other Ambulatory Visit: Payer: Medicare Other

## 2011-08-26 ENCOUNTER — Other Ambulatory Visit: Payer: Medicare Other

## 2011-08-31 ENCOUNTER — Ambulatory Visit (INDEPENDENT_AMBULATORY_CARE_PROVIDER_SITE_OTHER): Payer: Medicare Other | Admitting: Physician Assistant

## 2011-08-31 ENCOUNTER — Encounter: Payer: Self-pay | Admitting: Physician Assistant

## 2011-08-31 ENCOUNTER — Telehealth: Payer: Self-pay | Admitting: *Deleted

## 2011-08-31 ENCOUNTER — Ambulatory Visit (INDEPENDENT_AMBULATORY_CARE_PROVIDER_SITE_OTHER): Payer: Medicare Other | Admitting: Pharmacist

## 2011-08-31 VITALS — BP 132/80 | HR 60 | Ht 63.0 in | Wt 193.0 lb

## 2011-08-31 DIAGNOSIS — I4891 Unspecified atrial fibrillation: Secondary | ICD-10-CM

## 2011-08-31 DIAGNOSIS — I2510 Atherosclerotic heart disease of native coronary artery without angina pectoris: Secondary | ICD-10-CM

## 2011-08-31 DIAGNOSIS — R0609 Other forms of dyspnea: Secondary | ICD-10-CM

## 2011-08-31 LAB — BASIC METABOLIC PANEL
BUN: 18 mg/dL (ref 6–23)
CO2: 25 mEq/L (ref 19–32)
Calcium: 11.2 mg/dL — ABNORMAL HIGH (ref 8.4–10.5)
Chloride: 104 mEq/L (ref 96–112)
Creatinine, Ser: 1.4 mg/dL — ABNORMAL HIGH (ref 0.4–1.2)
Glucose, Bld: 143 mg/dL — ABNORMAL HIGH (ref 70–99)

## 2011-08-31 NOTE — Telephone Encounter (Signed)
lmom labs ok 

## 2011-08-31 NOTE — Patient Instructions (Signed)
Your physician recommends that you schedule a follow-up appointment in: 4-6 WEEKS WITH DR. [facility]  Your physician recommends that you return for lab work in: TODAY BMET, BNP  NO CHANGES TODAY

## 2011-08-31 NOTE — Progress Notes (Signed)
[address] [place], [place]  [address] Phone: [phone] Fax:  [phone]  Date:  08/31/2011   Name:  [name]       DOB:  January 22, 1941 MRN:  [id number]  PCP:  Dr. Jonny Ruiz  Primary Cardiologist:  Dr. Rollene Rotunda  Primary Electrophysiologist:  None    History of Present Illness: [name] is a 71 y.o. female who returns for follow up.    She has a h/o HOCM, diastolic CHF and AFib with RVR in 03/2010 with assoc. NSTEMI. Cath at that time demonstrated nonobstructive CAD. Her hospitalization was c/b bradycardic arrest and prolonged hospitalization.   Last seen by Dr. Rollene Rotunda 04/2011.  Follow up echo 05/06/11:  Severe LVH, EF 65-70%, dynamic obstruction mid cavity with mid cavity obliteration, peak gradient 75 mmHg, no change with valsalva, grade 2 diastolic dysfunction, no SAM, mod LAE, PASP 36.  I saw her 4/9.  She complained of dyspnea with exertion.  This seemed to be worse.  I put her on Lasix.  Creatinine was 1.6.  I opted to keep her on QOD dosing.  Notes today breathing about the same.  No chest pain.  No syncope.  No orthopnea, PND or edema.  She is still smoking.  Feels a little better with the lasix.     Past Medical History  Diagnosis Date  . CAD (coronary artery disease)     cath 11/11: LAD 40%, mid-dist 25-30%; prox CFX 30%, mid 40%; OM 50%; PDA 50%; PLV 50%;  EF 65%  . Acute myocardial infarction     in setting of AFib with RVR 03/2010  . Atrial fibrillation     coumadin  . HOCM (hypertrophic obstructive cardiomyopathy)     Echo 03/2010: with peak gradient of Hg, ejection fraction (EF) 65-70%; mod LVH; mod LAE;  echo 05/06/11:  Severe LVH, EF 65-70%, dynamic obstruction mid cavity with mid cavity obliteration, peak gradient 75 mmHg, no change with valsalva, grade 2 diastolic dysfunction, no SAM, mod LAE, PASP 36  . Hypertension   . Tobacco abuse   . Chronic kidney disease     Stage 4  . Iron deficiency     anemia    . CHF (congestive heart failure)     preserved LVF  . Iron deficiency anemia   . Third degree uterine prolapse   . Hx MRSA infection     Current Outpatient Prescriptions  Medication Sig Dispense Refill  . ferrous sulfate 325 (65 FE) MG tablet TAKE 1 TABLET BY MOUTH TWICE A DAY  60 tablet  1  . furosemide (LASIX) 20 MG tablet Take 1 tablet (20 mg total) by mouth every other day.      . metoprolol (LOPRESSOR) 50 MG tablet Take 1 tablet (50 mg total) by mouth 2 (two) times daily.  60 tablet  5  . nitroGLYCERIN (NITROSTAT) 0.4 MG SL tablet Place 0.4 mg under the tongue every 5 (five) minutes as needed.        . pantoprazole (PROTONIX) 40 MG tablet Take 1 tablet (40 mg total) by mouth daily.  90 tablet  3  . rosuvastatin (CRESTOR) 10 MG tablet Take 1 tablet (10 mg total) by mouth at bedtime.  30 tablet  6  . warfarin (COUMADIN) 7.5 MG tablet Take as directed by [facility]  35 tablet  0    Allergies: No Known Allergies  History  Substance Use Topics  . Smoking status: Current Everyday Smoker  .  Smokeless tobacco: Not on file  . Alcohol Use: No     PHYSICAL EXAM: VS:  BP 132/80  Pulse 60  Ht 5\' 3"  (1.6 m)  Wt 193 lb (87.544 kg)  BMI 34.19 kg/m2 Well nourished, well developed, in no acute distress HEENT: normal Neck: no JVD Cardiac:  normal S1, S2; RRR; 2/6 systolic murmur along LSB, no S3 Lungs:  Clear to ausc bilaterally, no wheezing, rhonchi, rales Abd: soft, nontender, no hepatomegaly Ext: no  edema Skin: warm and dry Neuro:  CNs 2-12 intact, no focal abnormalities noted  EKG:  Sinus brady, HR 61, LVH with repol abnormality  ASSESSMENT AND PLAN:  1. Shortness of breath  This is chronic and likely multifactorial.  I suspect volume is playing a part.  Continue current medical rx.  Check bmet and bnp to follow up today.  Recommend she quit smoking.     2. CARDIOMYOPATHY, IDIOPATHIC HYPERTROPHIC  Echo stable December 2012.  Follow up with Dr. Rollene Rotunda in 6  weeks.   3. Chronic diastolic heart failure  Appears stable.  Repeat bmet and bnp today.     4. HYPERTENSION  Improved.  Continue current Rx.   5. FIBRILLATION, ATRIAL  Maintaining sinus rhythm.  She remains on Coumadin.   6. CHRONIC KIDNEY DISEASE STAGE V  Recheck a basic metabolic panel today.   7. CAD  No angina.  She is not on aspirin due to Coumadin.  She remains on statin therapy.    8.  Tobacco Abuse       We discussed the importance of cessation.      Signed, Tereso Newcomer, PA-C  10:30 AM 08/31/2011

## 2011-08-31 NOTE — Telephone Encounter (Signed)
Message copied by Tarri Fuller on Tue Aug 31, 2011  5:42 PM ------      Message from: [place], [place] T      Created: Tue Aug 31, 2011  5:00 PM       Ok       Continue current medications      Tereso Newcomer, [place]  5:00 PM 08/31/2011

## 2011-09-21 ENCOUNTER — Other Ambulatory Visit: Payer: Self-pay | Admitting: Cardiology

## 2011-09-27 ENCOUNTER — Ambulatory Visit: Payer: Medicare Other | Admitting: Cardiology

## 2011-10-18 ENCOUNTER — Encounter: Payer: Self-pay | Admitting: Cardiology

## 2011-10-18 ENCOUNTER — Ambulatory Visit (INDEPENDENT_AMBULATORY_CARE_PROVIDER_SITE_OTHER): Payer: Medicare Other | Admitting: Cardiology

## 2011-10-18 ENCOUNTER — Ambulatory Visit (INDEPENDENT_AMBULATORY_CARE_PROVIDER_SITE_OTHER): Payer: Medicare Other | Admitting: *Deleted

## 2011-10-18 VITALS — BP 129/90 | HR 76 | Ht 63.0 in | Wt 193.8 lb

## 2011-10-18 DIAGNOSIS — F17200 Nicotine dependence, unspecified, uncomplicated: Secondary | ICD-10-CM

## 2011-10-18 DIAGNOSIS — I5032 Chronic diastolic (congestive) heart failure: Secondary | ICD-10-CM

## 2011-10-18 DIAGNOSIS — I4891 Unspecified atrial fibrillation: Secondary | ICD-10-CM

## 2011-10-18 DIAGNOSIS — I2510 Atherosclerotic heart disease of native coronary artery without angina pectoris: Secondary | ICD-10-CM

## 2011-10-18 DIAGNOSIS — I10 Essential (primary) hypertension: Secondary | ICD-10-CM

## 2011-10-18 DIAGNOSIS — R0989 Other specified symptoms and signs involving the circulatory and respiratory systems: Secondary | ICD-10-CM

## 2011-10-18 LAB — POCT INR: INR: 2.8

## 2011-10-18 NOTE — Patient Instructions (Signed)
Your physician wants you to follow-up in: 1 year with Dr. Hochrein.  You will receive a reminder letter in the mail two months in advance. If you don't receive a letter, please call our office to schedule the follow-up appointment.  

## 2011-10-18 NOTE — Assessment & Plan Note (Signed)
The patient has no new sypmtoms.  No further cardiovascular testing is indicated.  We will continue with aggressive risk reduction and meds as listed.  

## 2011-10-18 NOTE — Assessment & Plan Note (Signed)
She seems to be euvolemic.  At this point, no change in therapy is indicated.  We have reviewed salt and fluid restrictions.  No further cardiovascular testing is indicated.   

## 2011-10-18 NOTE — Assessment & Plan Note (Signed)
She is overdue for warfarin follow up.  We will check it today.  She does not want to change from warfarin.  However, she needs to be reminded of compliance.

## 2011-10-18 NOTE — Assessment & Plan Note (Signed)
We discussed a specific strategy for tobacco cessation.  (Greater than three minutes discussing tobacco cessation.)  

## 2011-10-18 NOTE — Assessment & Plan Note (Signed)
The blood pressure is at target. No change in medications is indicated. We will continue with therapeutic lifestyle changes (TLC).  

## 2011-10-18 NOTE — Progress Notes (Signed)
HPI The patient presents for followup of dyspnea. Previously she had a mildly elevated BNP. It is felt that her symptoms are multifactorial with some diastolic dysfunction related to some septal hypertrophy. There is probably deconditioning as well as ongoing tobacco abuse.  She reports that her breathing is at baseline. She will get dyspneic walking a short distance on level ground. She denies chest pain.  She denies PND or orthopnea.  She has no palpitations, presyncope or syncope.   No Known Allergies  Current Outpatient Prescriptions  Medication Sig Dispense Refill  . ferrous sulfate 325 (65 FE) MG tablet TAKE 1 TABLET BY MOUTH TWICE A DAY  60 tablet  1  . furosemide (LASIX) 20 MG tablet Take 1 tablet (20 mg total) by mouth every other day.      . metoprolol (LOPRESSOR) 50 MG tablet Take 1 tablet (50 mg total) by mouth 2 (two) times daily.  60 tablet  5  . nitroGLYCERIN (NITROSTAT) 0.4 MG SL tablet Place 0.4 mg under the tongue every 5 (five) minutes as needed.        . pantoprazole (PROTONIX) 40 MG tablet Take 1 tablet (40 mg total) by mouth daily.  90 tablet  3  . rosuvastatin (CRESTOR) 10 MG tablet Take 1 tablet (10 mg total) by mouth at bedtime.  30 tablet  6  . warfarin (COUMADIN) 7.5 MG tablet TAKE AS DIRECTED BY COUMADIN CLINIC  35 tablet  3    Past Medical History  Diagnosis Date  . CAD (coronary artery disease)     cath 11/11: LAD 40%, mid-dist 25-30%; prox CFX 30%, mid 40%; OM 50%; PDA 50%; PLV 50%;  EF 65%  . Acute myocardial infarction     in setting of AFib with RVR 03/2010  . Atrial fibrillation     coumadin  . HOCM (hypertrophic obstructive cardiomyopathy)     Echo 03/2010: with peak gradient of Hg, ejection fraction (EF) 65-70%; mod LVH; mod LAE;  echo 05/06/11:  Severe LVH, EF 65-70%, dynamic obstruction mid cavity with mid cavity obliteration, peak gradient 75 mmHg, no change with valsalva, grade 2 diastolic dysfunction, no SAM, mod LAE, PASP 36  .  Hypertension   . Tobacco abuse   . Chronic kidney disease     Stage 4  . Iron deficiency     anemia  . CHF (congestive heart failure)     preserved LVF  . Iron deficiency anemia   . Third degree uterine prolapse   . Hx MRSA infection     Past Surgical History  Procedure Date  . None     ROS:  She describes some vaginal spotting.  Otherwise, as stated in the HPI and negative for all other systems.  PHYSICAL EXAM BP 129/90  Pulse 76  Ht 5\' 3"  (1.6 m)  Wt 193 lb 12.8 oz (87.907 kg)  BMI 34.33 kg/m2 GENERAL:  Well appearing NECK:  No jugular venous distention, waveform within normal limits, carotid upstroke brisk and symmetric, no bruits, no thyromegaly LUNGS:  Clear to auscultation bilaterally BACK:  No CVA tenderness CHEST:  Unremarkable HEART:  PMI not displaced or sustained,S1 and S2 within normal limits, no S3, postive S4 no clicks, no rubs, slight apical systolic murmur increases with the strain phase of Valsalva ABD:  Flat, positive bowel sounds normal in frequency in pitch, no bruits, no rebound, no guarding, no midline pulsatile mass, no hepatomegaly, no splenomegaly, obese EXT:  2 plus pulses throughout, no edema,  no cyanosis no clubbing   ASSESSMENT AND PLAN

## 2011-10-26 ENCOUNTER — Other Ambulatory Visit: Payer: Self-pay | Admitting: Cardiology

## 2011-10-26 ENCOUNTER — Other Ambulatory Visit: Payer: Self-pay

## 2011-10-26 MED ORDER — WARFARIN SODIUM 7.5 MG PO TABS: ORAL_TABLET | ORAL | Status: DC

## 2011-11-15 ENCOUNTER — Encounter (HOSPITAL_COMMUNITY): Payer: Self-pay | Admitting: *Deleted

## 2011-11-15 ENCOUNTER — Emergency Department (HOSPITAL_COMMUNITY): Payer: Medicare Other

## 2011-11-15 ENCOUNTER — Inpatient Hospital Stay (HOSPITAL_COMMUNITY)
Admission: EM | Admit: 2011-11-15 | Discharge: 2011-11-18 | DRG: 280 | Disposition: A | Discharge status: Home / Self Care | Payer: Medicare Other | Attending: Cardiology | Admitting: Cardiology

## 2011-11-15 DIAGNOSIS — I4891 Unspecified atrial fibrillation: Secondary | ICD-10-CM

## 2011-11-15 DIAGNOSIS — E669 Obesity, unspecified: Secondary | ICD-10-CM | POA: Diagnosis present

## 2011-11-15 DIAGNOSIS — N184 Chronic kidney disease, stage 4 (severe): Secondary | ICD-10-CM | POA: Diagnosis present

## 2011-11-15 DIAGNOSIS — Z6834 Body mass index (BMI) 34.0-34.9, adult: Secondary | ICD-10-CM

## 2011-11-15 DIAGNOSIS — I252 Old myocardial infarction: Secondary | ICD-10-CM

## 2011-11-15 DIAGNOSIS — D509 Iron deficiency anemia, unspecified: Secondary | ICD-10-CM | POA: Diagnosis present

## 2011-11-15 DIAGNOSIS — I421 Obstructive hypertrophic cardiomyopathy: Secondary | ICD-10-CM | POA: Diagnosis present

## 2011-11-15 DIAGNOSIS — Z7901 Long term (current) use of anticoagulants: Secondary | ICD-10-CM

## 2011-11-15 DIAGNOSIS — N814 Uterovaginal prolapse, unspecified: Secondary | ICD-10-CM | POA: Diagnosis present

## 2011-11-15 DIAGNOSIS — Z8614 Personal history of Methicillin resistant Staphylococcus aureus infection: Secondary | ICD-10-CM

## 2011-11-15 DIAGNOSIS — I129 Hypertensive chronic kidney disease with stage 1 through stage 4 chronic kidney disease, or unspecified chronic kidney disease: Secondary | ICD-10-CM | POA: Diagnosis present

## 2011-11-15 DIAGNOSIS — N289 Disorder of kidney and ureter, unspecified: Secondary | ICD-10-CM | POA: Diagnosis present

## 2011-11-15 DIAGNOSIS — I509 Heart failure, unspecified: Secondary | ICD-10-CM | POA: Diagnosis present

## 2011-11-15 DIAGNOSIS — I5033 Acute on chronic diastolic (congestive) heart failure: Secondary | ICD-10-CM

## 2011-11-15 DIAGNOSIS — I214 Non-ST elevation (NSTEMI) myocardial infarction: Secondary | ICD-10-CM | POA: Diagnosis not present

## 2011-11-15 DIAGNOSIS — Z79899 Other long term (current) drug therapy: Secondary | ICD-10-CM

## 2011-11-15 DIAGNOSIS — R079 Chest pain, unspecified: Secondary | ICD-10-CM

## 2011-11-15 DIAGNOSIS — F17200 Nicotine dependence, unspecified, uncomplicated: Secondary | ICD-10-CM | POA: Diagnosis present

## 2011-11-15 DIAGNOSIS — I2510 Atherosclerotic heart disease of native coronary artery without angina pectoris: Secondary | ICD-10-CM | POA: Diagnosis present

## 2011-11-15 LAB — CARDIAC PANEL(CRET KIN+CKTOT+MB+TROPI)
CK, MB: 5.3 ng/mL — ABNORMAL HIGH (ref 0.3–4.0)
CK, MB: 19.8 ng/mL (ref 0.3–4.0)
CK, MB: 19.4 ng/mL (ref 0.3–4.0)
Relative Index: 7.7 — ABNORMAL HIGH (ref 0.0–2.5)
Relative Index: 7.3 — ABNORMAL HIGH (ref 0.0–2.5)
Total CK: 258 U/L — ABNORMAL HIGH (ref 7–177)
Troponin I: 2.83 ng/mL (ref <0.30)
Troponin I: 2.11 ng/mL (ref <0.30)

## 2011-11-15 LAB — PROTIME-INR: Prothrombin Time: 41.7 seconds — ABNORMAL HIGH (ref 11.6–15.2)

## 2011-11-15 LAB — CBC
HCT: 33.6 % — ABNORMAL LOW (ref 36.0–46.0)
MCHC: 32.1 g/dL (ref 30.0–36.0)
RDW: 16.6 % — ABNORMAL HIGH (ref 11.5–15.5)

## 2011-11-15 LAB — BASIC METABOLIC PANEL
BUN: 14 mg/dL (ref 6–23)
GFR calc Af Amer: 36 mL/min — ABNORMAL LOW (ref >90)
GFR calc non Af Amer: 31 mL/min — ABNORMAL LOW (ref >90)
Potassium: 3.6 mEq/L (ref 3.5–5.1)

## 2011-11-15 LAB — POCT I-STAT TROPONIN I

## 2011-11-15 MED ORDER — ASPIRIN 325 MG PO TABS
325.0000 mg | ORAL_TABLET | ORAL | Status: AC
  Administered 2011-11-15: 325 mg via ORAL
  Filled 2011-11-15: qty 1

## 2011-11-15 MED ORDER — DEXTROSE 5 % IV SOLN
60.0000 mg/h | Freq: Once | INTRAVENOUS | Status: DC
  Filled 2011-11-15: qty 9

## 2011-11-15 MED ORDER — PANTOPRAZOLE SODIUM 40 MG PO TBEC
40.0000 mg | DELAYED_RELEASE_TABLET | Freq: Every day | ORAL | Status: DC
  Administered 2011-11-15 – 2011-11-17 (×3): 40 mg via ORAL
  Filled 2011-11-15 (×4): qty 1

## 2011-11-15 MED ORDER — AMIODARONE HCL IN DEXTROSE 360-4.14 MG/200ML-% IV SOLN
30.0000 mg/h | INTRAVENOUS | Status: DC
  Administered 2011-11-15: 30 mg/h via INTRAVENOUS
  Administered 2011-11-16 (×2): 30.96 mg/h via INTRAVENOUS
  Filled 2011-11-15 (×7): qty 200

## 2011-11-15 MED ORDER — SODIUM CHLORIDE 0.9 % IV SOLN
250.0000 mL | INTRAVENOUS | Status: DC | PRN
  Administered 2011-11-15: 250 mL via INTRAVENOUS

## 2011-11-15 MED ORDER — ASPIRIN EC 325 MG PO TBEC
325.0000 mg | DELAYED_RELEASE_TABLET | Freq: Every day | ORAL | Status: DC
  Administered 2011-11-16 – 2011-11-17 (×2): 325 mg via ORAL
  Filled 2011-11-15 (×4): qty 1

## 2011-11-15 MED ORDER — AMIODARONE HCL IN DEXTROSE 360-4.14 MG/200ML-% IV SOLN
INTRAVENOUS | Status: AC
  Filled 2011-11-15: qty 200

## 2011-11-15 MED ORDER — SODIUM CHLORIDE 0.9 % IJ SOLN
3.0000 mL | Freq: Two times a day (BID) | INTRAMUSCULAR | Status: DC
  Administered 2011-11-15 – 2011-11-18 (×6): 3 mL via INTRAVENOUS

## 2011-11-15 MED ORDER — FUROSEMIDE 10 MG/ML IJ SOLN
20.0000 mg | Freq: Every day | INTRAMUSCULAR | Status: DC
  Administered 2011-11-15: 20 mg via INTRAVENOUS
  Filled 2011-11-15 (×3): qty 2

## 2011-11-15 MED ORDER — FUROSEMIDE 10 MG/ML IJ SOLN: 40.0000 mg | Freq: Once | INTRAMUSCULAR | Status: DC

## 2011-11-15 MED ORDER — DILTIAZEM HCL 100 MG IV SOLR
10.0000 mg/h | Freq: Once | INTRAVENOUS | Status: AC
  Administered 2011-11-15: 10 mg/h via INTRAVENOUS
  Filled 2011-11-15: qty 100

## 2011-11-15 MED ORDER — POTASSIUM CHLORIDE CRYS ER 20 MEQ PO TBCR
40.0000 meq | EXTENDED_RELEASE_TABLET | Freq: Once | ORAL | Status: AC
  Administered 2011-11-15: 40 meq via ORAL
  Filled 2011-11-15: qty 2

## 2011-11-15 MED ORDER — SODIUM CHLORIDE 0.9 % IJ SOLN: 3.0000 mL | INTRAMUSCULAR | Status: DC | PRN

## 2011-11-15 MED ORDER — FERROUS SULFATE 325 (65 FE) MG PO TABS
325.0000 mg | ORAL_TABLET | Freq: Two times a day (BID) | ORAL | Status: DC
  Administered 2011-11-15 – 2011-11-18 (×7): 325 mg via ORAL
  Filled 2011-11-15 (×8): qty 1

## 2011-11-15 MED ORDER — AMIODARONE IV BOLUS ONLY 150 MG/100ML
150.0000 mg | Freq: Once | INTRAVENOUS | Status: DC
  Filled 2011-11-15: qty 100

## 2011-11-15 MED ORDER — DILTIAZEM HCL 25 MG/5ML IV SOLN
15.0000 mg | Freq: Once | INTRAVENOUS | Status: AC
  Administered 2011-11-15: 15 mg via INTRAVENOUS
  Filled 2011-11-15: qty 5

## 2011-11-15 MED ORDER — ATORVASTATIN CALCIUM 20 MG PO TABS
20.0000 mg | ORAL_TABLET | Freq: Every day | ORAL | Status: DC
  Administered 2011-11-15 – 2011-11-17 (×3): 20 mg via ORAL
  Filled 2011-11-15 (×4): qty 1

## 2011-11-15 MED ORDER — AMIODARONE LOAD VIA INFUSION
150.0000 mg | Freq: Once | INTRAVENOUS | Status: AC
  Administered 2011-11-15: 150 mg via INTRAVENOUS
  Filled 2011-11-15: qty 83.34

## 2011-11-15 MED ORDER — AMIODARONE HCL IN DEXTROSE 360-4.14 MG/200ML-% IV SOLN
60.0000 mg/h | INTRAVENOUS | Status: DC
  Administered 2011-11-15: 60 mg/h via INTRAVENOUS
  Filled 2011-11-15 (×5): qty 200

## 2011-11-15 MED ORDER — METOPROLOL TARTRATE 25 MG PO TABS
25.0000 mg | ORAL_TABLET | Freq: Two times a day (BID) | ORAL | Status: DC
  Administered 2011-11-15 – 2011-11-18 (×7): 25 mg via ORAL
  Filled 2011-11-15 (×9): qty 1

## 2011-11-15 MED ORDER — AMIODARONE HCL IN DEXTROSE 360-4.14 MG/200ML-% IV SOLN
60.0000 mg/h | Freq: Once | INTRAVENOUS | Status: AC
  Administered 2011-11-15: 33.3333 mL/h via INTRAVENOUS
  Filled 2011-11-15: qty 200

## 2011-11-15 MED ORDER — NITROGLYCERIN 0.4 MG SL SUBL: 0.4000 mg | SUBLINGUAL_TABLET | SUBLINGUAL | Status: DC | PRN

## 2011-11-15 MED ORDER — ONDANSETRON HCL 4 MG/2ML IJ SOLN: 4.0000 mg | Freq: Four times a day (QID) | INTRAMUSCULAR | Status: DC | PRN

## 2011-11-15 MED ORDER — ACETAMINOPHEN 325 MG PO TABS: 650.0000 mg | ORAL_TABLET | ORAL | Status: DC | PRN

## 2011-11-15 MED ORDER — ONDANSETRON HCL 4 MG/2ML IJ SOLN
INTRAMUSCULAR | Status: AC
  Filled 2011-11-15: qty 2

## 2011-11-15 NOTE — Progress Notes (Signed)
Notified from monitor tech of patient's heart rate elevating to the 140's. Went in to assess patient, patient stated she just sat up on side of the bed to eat breakfast and that she feels fine. VSS and is asymptomatic will continue to monitor.

## 2011-11-15 NOTE — Progress Notes (Signed)
CRITICAL VALUE ALERT  Critical value received:1223  Date of notification:  11/15/2011  Time of notification:  1223  Critical value read back:yes  Nurse who received alert:  Milta Deiters  MD notified (1st page):  1225  Time of first page:  1225  MD notified (2nd page):  Time of second page:  Responding MD:  Dr. Alinda Money  Time MD responded:  1229

## 2011-11-15 NOTE — Progress Notes (Signed)
Notified MD of increase heart rate-140's with activity, at rest-125, and patient is asymptomatic. No new orders given but to give scheduled Lopressor and continue to monitor. Pt states she still feels fine VSS. Will continue to monitor to end of shift.

## 2011-11-15 NOTE — Progress Notes (Signed)
ANTICOAGULATION CONSULT NOTE - Initial Consult  Pharmacy Consult for Coumadin Indication: atrial fibrillation  No Known Allergies   Labs:  Basename 11/15/11 0125 11/15/11 0123  HGB -- 10.8*  HCT -- 33.6*  PLT -- 269  APTT -- --  LABPROT -- 41.7*  INR -- 4.28*  HEPARINUNFRC -- --  CREATININE -- 1.63*  CKTOTAL 198* --  CKMB 5.3* --  TROPONINI <0.30 --    Estimated Creatinine Clearance: 32.3 ml/min (by C-G formula based on Cr of 1.63).   Medical History: Past Medical History  Diagnosis Date  . CAD (coronary artery disease)     cath 11/11: LAD 40%, mid-dist 25-30%; prox CFX 30%, mid 40%; OM 50%; PDA 50%; PLV 50%;  EF 65%  . Acute myocardial infarction     in setting of AFib with RVR 03/2010  . Atrial fibrillation     coumadin  . HOCM (hypertrophic obstructive cardiomyopathy)     Echo 03/2010: with peak gradient of Hg, ejection fraction (EF) 65-70%; mod LVH; mod LAE;  echo 05/06/11:  Severe LVH, EF 65-70%, dynamic obstruction mid cavity with mid cavity obliteration, peak gradient 75 mmHg, no change with valsalva, grade 2 diastolic dysfunction, no SAM, mod LAE, PASP 36  . Hypertension   . Tobacco abuse   . Chronic kidney disease     Stage 4  . Iron deficiency     anemia  . CHF (congestive heart failure)     preserved LVF  . Iron deficiency anemia   . Third degree uterine prolapse   . Hx MRSA infection     Assessment: 71 year old admitted with CP and SOB.  On Coumadin PTA for Afib.  Planning cardioversion  INR is supra-therapeutic at 4.28  Dose PTA = 7.5 mg daily Goal of Therapy:  INR 2-3 Monitor platelets by anticoagulation protocol: Yes   Plan:  1) No Coumadin today 2) Daily PT / INR  Thank you.  Elwin Sleight 11/15/2011,8:16 AM

## 2011-11-15 NOTE — Progress Notes (Signed)
Patient ID: [name], female   DOB: 08/24/1940, 71 y.o.   MRN: [id number]    Subjective:  Denies SSCP, palpitations or Dyspnea Feels better than earlier this am in ER  Objective:  Filed Vitals:   11/15/11 0400 11/15/11 0430 11/15/11 0702 11/15/11 0753  BP: 127/75 135/58 120/87 148/92  Pulse: 62 61 83 60  Temp:    97.7 F (36.5 C)  TempSrc:    Oral  Resp: 28 28 20 20   Height:    5\' 2"  (1.575 m)  Weight:    86.4 kg (190 lb 7.6 oz)  SpO2: 97% 96% 97% 95%    Intake/Output from previous day: No intake or output data in the 24 hours ending 11/15/11 0806  Physical Exam: Affect appropriate Obese black female HEENT: normal Neck supple with no adenopathy JVP normal no bruits no thyromegaly Lungs clear with no wheezing and good diaphragmatic motion Heart:  S1/S2 SEM  murmur, no rub, gallop or click PMI normal Abdomen: benighn, BS positve, no tenderness, no AAA no bruit.  No HSM or HJR Distal pulses intact with no bruits Plus one bilateral  edema Neuro non-focal Skin warm and dry No muscular weakness   Lab Results: Basic Metabolic Panel:  Basename 11/15/11 0123  NA 137  K 3.6  CL 103  CO2 24  GLUCOSE 138*  BUN 14  CREATININE 1.63*  CALCIUM 11.4*  MG --  PHOS --   Liver Function Tests: No results found for this basename: AST:2,ALT:2,ALKPHOS:2,BILITOT:2,PROT:2,ALBUMIN:2 in the last 72 hours No results found for this basename: LIPASE:2,AMYLASE:2 in the last 72 hours CBC:  Basename 11/15/11 0123  WBC 8.4  NEUTROABS --  HGB 10.8*  HCT 33.6*  MCV 81.2  PLT 269   Cardiac Enzymes:  Basename 11/15/11 0125  CKTOTAL 198*  CKMB 5.3*  CKMBINDEX --  TROPONINI <0.30    Imaging: Dg Chest Portable 1 View  11/15/2011  *RADIOLOGY REPORT*  Clinical Data: Chest pain  PORTABLE CHEST - 1 VIEW  Comparison: 06/03/2011  Findings: Heterogeneous opacities are seen bilaterally with a central basilar distribution.  Cardiomegaly.  No pneumothorax.  IMPRESSION: Cardiomegaly  and bilateral airspace disease worrisome for volume overload.  An infectious process is not excluded.  Original Report Authenticated By: Donavan Burnet, M.D.    Cardiac Studies:  ECG:  Rapid afib with LVH and strain   Telemetry: Afib rate improved 90's 11/15/2011   Echo: Study Conclusions  - Left ventricle: The cavity size was normal. Wall thickness was increased in a pattern of severe LVH. Systolic function was vigorous. The estimated ejection fraction was in the range of 65% to 70%. There was dynamic obstruction in the mid cavity, with mid-cavity obliteration and a peak gradient of 75mm Hg - no apparent change with Valsalva. Regional wall motion abnormalities cannot be excluded. Features are consistent with a pseudonormal left ventricular filling pattern, with concomitant abnormal relaxation and increased filling pressure (grade 2 diastolic dysfunction). - Mitral valve: No SAM. - Left atrium: The atrium was moderately dilated. - Pulmonary arteries: PA peak pressure: 36mm Hg (S).    Medications:     . amiodarone  150 mg Intravenous Once   And  . amiodarone (NEXTERONE PREMIX) 360 mg/200 mL dextrose  60 mg/hr Intravenous Once  . aspirin EC  325 mg Oral Daily  . aspirin  325 mg Oral STAT  . atorvastatin  20 mg Oral q1800  . diltiazem (CARDIZEM) infusion  10 mg/hr Intravenous Once  . diltiazem  15  mg Intravenous Once  . ferrous sulfate  325 mg Oral BID  . furosemide  20 mg Intravenous Daily  . metoprolol tartrate  25 mg Oral BID  . ondansetron      . pantoprazole  40 mg Oral Q1200  . potassium chloride  40 mEq Oral Once  . sodium chloride  3 mL Intravenous Q12H  . DISCONTD: amiodarone (CORDARONE) infusion  60 mg/hr Intravenous Once  . DISCONTD: amiodarone  150 mg Intravenous Once  . DISCONTD: furosemide  40 mg Intravenous Once       Assessment/Plan:  Afib:  Discussed options with patient at length.  She has been on Rx coumadin.  Hesitant to have [facility].  Discussed Rx with  amiodarone and cardizem Today and if she does not convert plan bedside [facility] tomorrow She is agreeable to this.  Trish to schedule [facility] for tomorrow.   HOCM:  Mid cavitary some edema lasix once daily change to po in am  Chol:  Continue statin.    If afib recurs would consult EPS and consider dysopyramide given her "HOCM: as a possible antiarrhythmic to prevent further recurrence  Charlton Haws 11/15/2011, 8:06 AM

## 2011-11-15 NOTE — ED Provider Notes (Signed)
History     CSN: [id number]  Arrival date & time 11/15/11  0110   First MD Initiated Contact with Patient 11/15/11 0124      Chief Complaint  Patient presents with  . Chest Pain    (Consider location/radiation/quality/duration/timing/severity/associated sxs/prior treatment) HPI 71 year old female presents to the emergency department complaining of shortness of breath and chest pain. Patient reports she was trying to get comfortable in the bed, and when she rolled over, she noted acute worsening of her baseline shortness of breath along with palpitations and chest pain. Patient has history of chronic A. fib on Coumadin, hypertrophic obstructive cardiomyopathy, coronary artery disease, last cath without sig significant stenosis, chronic kidney disease, and congestive heart failure. Patient denies any missed medications. Patient had episode of nausea and vomiting and route with EMS, but none currently. She denies any diaphoresis.  Past Medical History  Diagnosis Date  . CAD (coronary artery disease)     cath 11/11: LAD 40%, mid-dist 25-30%; prox CFX 30%, mid 40%; OM 50%; PDA 50%; PLV 50%;  EF 65%  . Acute myocardial infarction     in setting of AFib with RVR 03/2010  . Atrial fibrillation     coumadin  . HOCM (hypertrophic obstructive cardiomyopathy)     Echo 03/2010: with peak gradient of Hg, ejection fraction (EF) 65-70%; mod LVH; mod LAE;  echo 05/06/11:  Severe LVH, EF 65-70%, dynamic obstruction mid cavity with mid cavity obliteration, peak gradient 75 mmHg, no change with valsalva, grade 2 diastolic dysfunction, no SAM, mod LAE, PASP 36  . Hypertension   . Tobacco abuse   . Chronic kidney disease     Stage 4  . Iron deficiency     anemia  . CHF (congestive heart failure)     preserved LVF  . Iron deficiency anemia   . Third degree uterine prolapse   . Hx MRSA infection     Past Surgical History  Procedure Date  . None     Family History  Problem Relation Age of  Onset  . Coronary artery disease Neg Hx   . Atrial fibrillation Neg Hx     History  Substance Use Topics  . Smoking status: Current Everyday Smoker  . Smokeless tobacco: Not on file  . Alcohol Use: No    OB History    Grav Para Term Preterm Abortions TAB SAB Ect Mult Living                  Review of Systems  All other systems reviewed and are negative.    Allergies  Review of patient's allergies indicates no known allergies.  Home Medications   Current Outpatient Rx  Name Route Sig Dispense Refill  . FERROUS SULFATE 325 (65 FE) MG PO TABS Oral Take 325 mg by mouth 2 (two) times daily.    . FUROSEMIDE 20 MG PO TABS Oral Take 20 mg by mouth daily.     Marland Kitchen NITROGLYCERIN 0.4 MG SL SUBL Sublingual Place 0.4 mg under the tongue every 5 (five) minutes as needed. For chest pain    . PANTOPRAZOLE SODIUM 40 MG PO TBEC Oral Take 40 mg by mouth daily.    Marland Kitchen ROSUVASTATIN CALCIUM 10 MG PO TABS Oral Take 10 mg by mouth at bedtime.    . WARFARIN SODIUM 7.5 MG PO TABS Oral Take 7.5 mg by mouth daily.      BP 135/86  Pulse 72  Temp 98.9 F (37.2 C) (Oral)  Resp 25  SpO2 95%  Physical Exam  Nursing note and vitals reviewed. Constitutional: She is oriented to person, place, and time. She appears well-developed and well-nourished.  HENT:  Head: Normocephalic and atraumatic.  Nose: Nose normal.  Mouth/Throat: Oropharynx is clear and moist.  Eyes: Conjunctivae and EOM are normal. Pupils are equal, round, and reactive to light.  Neck: Normal range of motion. Neck supple. No JVD present. No tracheal deviation present. No thyromegaly present.  Cardiovascular: Intact distal pulses.  Exam reveals no gallop and no friction rub.   Murmur heard.      Tachycardia with irregular rate noted  Pulmonary/Chest: Effort normal. No stridor. No respiratory distress. She has no wheezes. She has rales (fine crackles noted in lower lung fields). She exhibits no tenderness.  Abdominal: Soft. Bowel sounds  are normal. She exhibits no distension and no mass. There is no tenderness. There is no rebound and no guarding.  Musculoskeletal: Normal range of motion. She exhibits edema (trace edema in ankles). She exhibits no tenderness.  Lymphadenopathy:    She has no cervical adenopathy.  Neurological: She is oriented to person, place, and time. She exhibits normal muscle tone. Coordination normal.  Skin: Skin is dry. No rash noted. No erythema. No pallor.  Psychiatric: She has a normal mood and affect. Her behavior is normal. Judgment and thought content normal.    ED Course  Procedures (including critical care time)  Labs Reviewed  PRO B NATRIURETIC PEPTIDE - Abnormal; Notable for the following:    Pro B Natriuretic peptide (BNP) 661.9 (*)     All other components within normal limits  CARDIAC PANEL(CRET KIN+CKTOT+MB+TROPI) - Abnormal; Notable for the following:    Total CK 198 (*)     CK, MB 5.3 (*)     Relative Index 2.7 (*)     All other components within normal limits  POCT I-STAT TROPONIN I  CBC  BASIC METABOLIC PANEL  PROTIME-INR   Dg Chest Portable 1 View  11/15/2011  *RADIOLOGY REPORT*  Clinical Data: Chest pain  PORTABLE CHEST - 1 VIEW  Comparison: 06/03/2011  Findings: Heterogeneous opacities are seen bilaterally with a central basilar distribution.  Cardiomegaly.  No pneumothorax.  IMPRESSION: Cardiomegaly and bilateral airspace disease worrisome for volume overload.  An infectious process is not excluded.  Original Report Authenticated By: Donavan Burnet, M.D.    Date: 11/15/2011  Rate: 147  Rhythm: atrial fibrillation  QRS Axis: normal  Intervals: atrial fibrillation  ST/T Wave abnormalities: ST depressions diffusely  Conduction Disutrbances:atrial fibrillation  Narrative Interpretation: ST depressions and inverted t waves unchanged from prior.  LVH stabe.  Rate significantly increased.  Old EKG Reviewed: changes noted    1. Acute on chronic congestive heart failure     2. Atrial fibrillation with RVR   3. Chest pain       MDM  71 year old female with chest pain, shortness of breath and noted to be in atrial fibrillation with rapid ventricular rate, rates 150s to 160s. Will start patient on diltiazem for rate control. Will send lab work, chest x-ray, and discuss with cardiology.        Olivia Mackie, MD 11/15/11 [phone]

## 2011-11-15 NOTE — Progress Notes (Signed)
UR Completed. Shantese Raven, RN, Nurse Case Manager [phone]     

## 2011-11-15 NOTE — ED Notes (Signed)
Pt woke up with sob and some CP.  Pt was not sure which started first.  CP was 8/10 at its worst.  Pain free on arrival.  Pt was in afib with RVR for ems en route.  Pt does not appear in any acute distress.  Pt had 324mg  asa and 1sl nitro en route from ems and 20g in RPFA

## 2011-11-15 NOTE — ED Notes (Signed)
Pharmacy called for amiodrone gtt not available at this time will call me when ready.

## 2011-11-15 NOTE — Progress Notes (Signed)
Notified MD of elevated diastolic no new orders given but wants nurse to continue to monitor.  Also Pt scheduled for 2DEcho, requested to do at bedside for Pt is on a drip in order to control elevated HR. Will continue to monitor at end of shift

## 2011-11-15 NOTE — ED Notes (Signed)
Per cardiology cardizem is to be stopped and amiodarone started,  Amiodarone ordered from pharmacy

## 2011-11-15 NOTE — H&P (Signed)
Primary Cardiologist: Hochrein  CC: Chest pain and shortness of breath  HPI: 71 year old African American female with a past medical history significant for hypertrophic obstructive cardiomyopathy, paroxysmal atrial fibrillation, nonobstructive coronary artery disease, who presents for evaluation of chest pain and shortness of breath. She was in her usual state of health until this evening, when she laid down to go to bed. She developed tachycardia, palpitations, associated with shortness of breath, and substernal chest pain. There was no syncope. The chest is was mild in severity. Because the symptoms, she came to the emergency department and was noted to be in atrial fibrillation with rapid ventricular response at approximately 150 beats per minute. She was started on IV diltiazem and cardiology was consulted. She denies chest pain.  Tele now demonstrates a heart rate in the 90s and she is going in and out of atrial fibrillation with RVR.    Past Medical History  Diagnosis Date  . CAD (coronary artery disease)     cath 11/11: LAD 40%, mid-dist 25-30%; prox CFX 30%, mid 40%; OM 50%; PDA 50%; PLV 50%;  EF 65%  . Acute myocardial infarction     in setting of AFib with RVR 03/2010  . Atrial fibrillation     coumadin  . HOCM (hypertrophic obstructive cardiomyopathy)     Echo 03/2010: with peak gradient of Hg, ejection fraction (EF) 65-70%; mod LVH; mod LAE;  echo 05/06/11:  Severe LVH, EF 65-70%, dynamic obstruction mid cavity with mid cavity obliteration, peak gradient 75 mmHg, no change with valsalva, grade 2 diastolic dysfunction, no SAM, mod LAE, PASP 36  . Hypertension   . Tobacco abuse   . Chronic kidney disease     Stage 4  . Iron deficiency     anemia  . CHF (congestive heart failure)     preserved LVF  . Iron deficiency anemia   . Third degree uterine prolapse   . Hx MRSA infection     History   Social History  . Marital Status: Single    Spouse Name: N/A    Number of  Children: N/A  . Years of Education: N/A   Occupational History  . Not on file.   Social History Main Topics  . Smoking status: Current Everyday Smoker  . Smokeless tobacco: Not on file  . Alcohol Use: No  . Drug Use: No  . Sexually Active: Not on file   Other Topics Concern  . Not on file   Social History Narrative   The patient lives with her son . He is not present to provide history. She currently 1-pack per day her family account. Denies any  Drugs any drugs or alcohol per family    Family History  Problem Relation Age of Onset  . Coronary artery disease Neg Hx   . Atrial fibrillation Neg Hx     No current facility-administered medications on file prior to encounter.   Current Outpatient Prescriptions on File Prior to Encounter  Medication Sig Dispense Refill  . furosemide (LASIX) 20 MG tablet Take 20 mg by mouth daily.       . nitroGLYCERIN (NITROSTAT) 0.4 MG SL tablet Place 0.4 mg under the tongue every 5 (five) minutes as needed. For chest pain      . DISCONTD: pantoprazole (PROTONIX) 40 MG tablet Take 1 tablet (40 mg total) by mouth daily.  90 tablet  3  . DISCONTD: warfarin (COUMADIN) 7.5 MG tablet Take as directed by anticoagulation clinic  45 tablet  1  . DISCONTD: rosuvastatin (CRESTOR) 10 MG tablet Take 1 tablet (10 mg total) by mouth at bedtime.  30 tablet  6    No Known Allergies  Review of Systems: All systems reviewed and are negative except as mentioned above in the history of present illness.    PHYSICAL EXAM: Filed Vitals:   11/15/11 0430  BP: 135/58  Pulse: 61  Temp:   Resp: 28   GENERAL: No acute distress.   HEENT: Normocephalic, atraumatic.  Oropharynx is pink and moist without lesions.  NECK: 7cm JVD. CV: Regular rate and rhythm with 2/6 SM at the LLSB that augments with inspiration   LUNGS: Faint bibasilar crackles.   ABDOMEN: +BS, soft, nontender, nondistended.  EXTREMITIES: No clubbing, cyanosis, or edema.   NEURO: AO x 3, no focal  deficits. PYSCH: Normal affect. SKIN: No rashes.    ECG: from 119 - AF RVR at 147 BPM, LVH with repol and diffuse STD and TWI.  Results for orders placed during the hospital encounter of 11/15/11 (from the past 24 hour(s))  CBC     Status: Abnormal   Collection Time   11/15/11  1:23 AM      Component Value Range   WBC 8.4  4.0 - 10.5 K/uL   RBC 4.14  3.87 - 5.11 MIL/uL   Hemoglobin 10.8 (*) 12.0 - 15.0 g/dL   HCT 84.6 (*) 96.2 - 95.2 %   MCV 81.2  78.0 - 100.0 fL   MCH 26.1  26.0 - 34.0 pg   MCHC 32.1  30.0 - 36.0 g/dL   RDW 84.1 (*) 32.4 - 40.1 %   Platelets 269  150 - 400 K/uL  BASIC METABOLIC PANEL     Status: Abnormal   Collection Time   11/15/11  1:23 AM      Component Value Range   Sodium 137  135 - 145 mEq/L   Potassium 3.6  3.5 - 5.1 mEq/L   Chloride 103  96 - 112 mEq/L   CO2 24  19 - 32 mEq/L   Glucose, Bld 138 (*) 70 - 99 mg/dL   BUN 14  6 - 23 mg/dL   Creatinine, Ser 0.27 (*) 0.50 - 1.10 mg/dL   Calcium 25.3 (*) 8.4 - 10.5 mg/dL   GFR calc non Af Amer 31 (*) >90 mL/min   GFR calc Af Amer 36 (*) >90 mL/min  PROTIME-INR     Status: Abnormal   Collection Time   11/15/11  1:23 AM      Component Value Range   Prothrombin Time 41.7 (*) 11.6 - 15.2 seconds   INR 4.28 (*) 0.00 - 1.49  PRO B NATRIURETIC PEPTIDE     Status: Abnormal   Collection Time   11/15/11  1:25 AM      Component Value Range   Pro B Natriuretic peptide (BNP) 661.9 (*) 0 - 125 pg/mL  CARDIAC PANEL(CRET KIN+CKTOT+MB+TROPI)     Status: Abnormal   Collection Time   11/15/11  1:25 AM      Component Value Range   Total CK 198 (*) 7 - 177 U/L   CK, MB 5.3 (*) 0.3 - 4.0 ng/mL   Troponin I <0.30  <0.30 ng/mL   Relative Index 2.7 (*) 0.0 - 2.5  POCT I-STAT TROPONIN I     Status: Normal   Collection Time   11/15/11  1:37 AM      Component Value Range   Troponin i, poc 0.03  0.00 - 0.08 ng/mL   Comment 3            Dg Chest Portable 1 View  11/15/2011  *RADIOLOGY REPORT*  Clinical Data: Chest pain  PORTABLE  CHEST - 1 VIEW  Comparison: 06/03/2011  Findings: Heterogeneous opacities are seen bilaterally with a central basilar distribution.  Cardiomegaly.  No pneumothorax.  IMPRESSION: Cardiomegaly and bilateral airspace disease worrisome for volume overload.  An infectious process is not excluded.  Original Report Authenticated By: Donavan Burnet, M.D.   ASSESSMENT and PLAN: 71 year old, African American female with a past medical history significant for hypertrophic obstructive cardiomyopathy, who presents with an acute on chronic diastolic heart failure exacerbation is secondary to uncontrolled atrial fibrillation with rapid ventricular response.  1: Admit to [facility]  2: Acute on chronic diastolic heart failure exacerbation.  I suspect that the patient developed atrial fibrillation and this evening, which caused her to go into acute diastolic heart failure. Her hypertrophic obstructive cardiomyopathy makes her very susceptible to atrial fibrillation, and because of that, I feel she would benefit from a rhythm control strategy. She currently is going in and out of atrial fibrillation and has been anticoagulated. Because of these factors, we will go ahead and start her on IV amiodarone and give her 150 mg bolus and start a drip. For unclear reasons, she is not on a beta blocker; however, this was on her medication list from her last clinic visit. I will go ahead and start her back on low-dose Lopressor. We will start her on Lasix 20 mg IV once a day and check an echocardiogram. We will need to be careful with the Lasix and she will be susceptible to volume depletion.  3: Atrial fibrillation with rapid ventricular response. As listed, above, we'll start on IV amiodarone and restart beta blocker. She is on Coumadin, and we will consult the pharmacy for management of this.  Of note, her INR is elevated.    4: FEN: SLIVF, Will give KCL for hypokalemia, NPO after midnight.    5: DVT prophylaxis:  N/A as patient on systemic anticoagulation  Bernestine Amass. Mayford Knife, MD Level 3 H&P

## 2011-11-16 ENCOUNTER — Encounter (HOSPITAL_COMMUNITY): Payer: Self-pay | Admitting: *Deleted

## 2011-11-16 ENCOUNTER — Ambulatory Visit: Admit: 2011-11-16 | Payer: Self-pay | Admitting: Cardiology

## 2011-11-16 ENCOUNTER — Encounter (HOSPITAL_COMMUNITY)
Admission: EM | Disposition: A | Discharge status: Home / Self Care | Payer: Self-pay | Source: Home / Self Care | Attending: Cardiology

## 2011-11-16 ENCOUNTER — Encounter (HOSPITAL_COMMUNITY): Payer: Self-pay | Admitting: General Practice

## 2011-11-16 DIAGNOSIS — I059 Rheumatic mitral valve disease, unspecified: Secondary | ICD-10-CM

## 2011-11-16 LAB — BASIC METABOLIC PANEL
Calcium: 11.4 mg/dL — ABNORMAL HIGH (ref 8.4–10.5)
GFR calc Af Amer: 32 mL/min — ABNORMAL LOW (ref >90)
GFR calc non Af Amer: 27 mL/min — ABNORMAL LOW (ref >90)
Glucose, Bld: 134 mg/dL — ABNORMAL HIGH (ref 70–99)
Potassium: 4.4 mEq/L (ref 3.5–5.1)
Sodium: 138 mEq/L (ref 135–145)

## 2011-11-16 LAB — PROTIME-INR
INR: 3.67 — ABNORMAL HIGH (ref 0.00–1.49)
Prothrombin Time: 37 seconds — ABNORMAL HIGH (ref 11.6–15.2)

## 2011-11-16 SURGERY — CARDIOVERSION
Anesthesia: Monitor Anesthesia Care

## 2011-11-16 MED ORDER — FUROSEMIDE 20 MG PO TABS
20.0000 mg | ORAL_TABLET | Freq: Every day | ORAL | Status: DC
  Administered 2011-11-16 – 2011-11-18 (×3): 20 mg via ORAL
  Filled 2011-11-16 (×3): qty 1

## 2011-11-16 NOTE — Progress Notes (Signed)
  Echocardiogram 2D Echocardiogram has been performed.  [name] 11/16/2011, 3:50 PM

## 2011-11-16 NOTE — Progress Notes (Signed)
11/16/11 @0100  . Dr Terressa Koyanagi notified regarding increase in troponin. Pt asymptomatic. No orders received.continue to monitor patient

## 2011-11-16 NOTE — Progress Notes (Signed)
   SUBJECTIVE:  Breathing better.  No distress   PHYSICAL EXAM Filed Vitals:   11/15/11 1052 11/15/11 1404 11/15/11 2208 11/16/11 0137  BP: 117/74 136/101 116/63 101/53  Pulse: 125 126 99 98  Temp:  98.3 F (36.8 C) 98.7 F (37.1 C) 98.5 F (36.9 C)  TempSrc:  Oral Oral Oral  Resp:  19 20 20   Height:      Weight:      SpO2:  100% 100% 100%   General:  No distress Lungs:  Clear Heart:  Irregular Abdomen:  Positive bowel sounds, no rebound no guarding Extremities:  No edema.    LABS: Lab Results  Component Value Date   CKTOTAL 220* 11/15/2011   CKMB 13.3* 11/15/2011   TROPONINI 2.32* 11/15/2011   Results for orders placed during the hospital encounter of 11/15/11 (from the past 24 hour(s))  MRSA PCR SCREENING     Status: Normal   Collection Time   11/15/11  8:21 AM      Component Value Range   MRSA by PCR NEGATIVE  NEGATIVE  CARDIAC PANEL(CRET KIN+CKTOT+MB+TROPI)     Status: Abnormal   Collection Time   11/15/11  9:47 AM      Component Value Range   Total CK 258 (*) 7 - 177 U/L   CK, MB 19.8 (*) 0.3 - 4.0 ng/mL   Troponin I 2.83 (*) <0.30 ng/mL   Relative Index 7.7 (*) 0.0 - 2.5  CARDIAC PANEL(CRET KIN+CKTOT+MB+TROPI)     Status: Abnormal   Collection Time   11/15/11  1:17 PM      Component Value Range   Total CK 265 (*) 7 - 177 U/L   CK, MB 19.4 (*) 0.3 - 4.0 ng/mL   Troponin I 2.11 (*) <0.30 ng/mL   Relative Index 7.3 (*) 0.0 - 2.5  CARDIAC PANEL(CRET KIN+CKTOT+MB+TROPI)     Status: Abnormal   Collection Time   11/15/11 11:22 PM      Component Value Range   Total CK 220 (*) 7 - 177 U/L   CK, MB 13.3 (*) 0.3 - 4.0 ng/mL   Troponin I 2.32 (*) <0.30 ng/mL   Relative Index 6.0 (*) 0.0 - 2.5  PROTIME-INR     Status: Abnormal   Collection Time   11/16/11  5:53 AM      Component Value Range   Prothrombin Time 37.0 (*) 11.6 - 15.2 seconds   INR 3.67 (*) 0.00 - 1.49    Intake/Output Summary (Last 24 hours) at 11/16/11 1610 Last data filed at 11/15/11 2200  Gross per 24  hour  Intake    300 ml  Output   1400 ml  Net  -1100 ml   Telemetry:   ASSESSMENT AND PLAN:  Atrial fibrillation:  DCCV was to be today but she was not NPO.  Plan for the am.  I will continue the IV amio today and switch to PO in the AM.  HCM:  Change to PO Lasix today.    NQWMI:  Enzymes are elevated.  She had this same presentation in the past with elevated enzymes and nonobstructive disease on cardiac cath.  At this point I don't think that cath is indicated.  I do not want to stop the warfarin for this.     Fayrene Fearing [facility] 11/16/2011 7:07 AM

## 2011-11-16 NOTE — Clinical Documentation Improvement (Signed)
GENERIC DOCUMENTATION CLARIFICATION QUERY  THIS DOCUMENT IS NOT A PERMANENT PART OF THE MEDICAL RECORD  TO RESPOND TO THE THIS QUERY, FOLLOW THE INSTRUCTIONS BELOW:  1. If needed, update documentation for the patient's encounter via the notes activity.  2. Access this query again and click edit on the In [employer].  3. After updating, or not, click F2 to complete all highlighted (required) fields concerning your review. Select "additional documentation in the medical record" OR "no additional documentation provided".  4. Click Sign note button.  5. The deficiency will fall out of your In Basket *Please let us know if you are not able to complete this workflow by phone or e-mail (listed below).  Please update your documentation within the medical record to reflect your response to this query.                                                                                        11/16/11   Dear Dr. Rollene Rotunda and  Associates,  In a better effort to capture your patient's severity of illness, reflect appropriate length of stay and utilization of resources, a review of the patient medical record has revealed the following indicators.    Based on your clinical judgment, please clarify and document in a progress note and/or discharge summary the clinical condition associated with the following supporting information:  In responding to this query please exercise your independent judgment.  The fact that a query is asked, does not imply that any particular answer is desired or expected.  For coding purposes and clarity please spell out at least once what these initials stand "NQWMI"  in progress notes and discharge summary. Thanks  Possible Clinical Conditions  Non Q-Wave Myocardial Infarction  Other Condition  Cannot Clinically Determine   Risk Factors: Hx CAD, A-fib, CKD 4,Hypertrophic obstructive cardiomyopathy Currently treating: Acute on Chronic Diastolic Heart  Failure  Diagnostics: Results for SHAKYA, SEBRING (MRN [id number]) as of 11/16/2011 15:18  Ref. Range 11/15/2011 01:37 11/15/2011 09:47 11/15/2011 13:17 11/15/2011 23:22  CK, MB Latest Range: 0.3-4.0 ng/mL  19.8 (HH) 19.4 (HH) 13.3 (HH)  CK Total Latest Range: 7-177 U/L  258 (H) 265 (H) 220 (H)  Troponin I Latest Range: <0.30 ng/mL  2.83 (HH) 2.11 (HH) 2.32 (HH)  Troponin i, poc Latest Range: 0.00-0.08 ng/mL 0.03       You may use possible, probable, or suspect with inpatient documentation. possible, probable, suspected diagnoses MUST be documented at the time of discharge  Reviewed: additional documentation in the medical record  Thank You,    Rossie Muskrat RN, BSN Clinical Documentation Specialist Pager:  [phone]  [email or web address]  Health Information Management [place]

## 2011-11-16 NOTE — Progress Notes (Signed)
ANTICOAGULATION CONSULT NOTE - Follow-Up Consult  Pharmacy Consult for Coumadin Indication: atrial fibrillation  No Known Allergies   Labs:  Basename 11/16/11 0553 11/15/11 2322 11/15/11 1317 11/15/11 0947 11/15/11 0123  HGB -- -- -- -- 10.8*  HCT -- -- -- -- 33.6*  PLT -- -- -- -- 269  APTT -- -- -- -- --  LABPROT 37.0* -- -- -- 41.7*  INR 3.67* -- -- -- 4.28*  HEPARINUNFRC -- -- -- -- --  CREATININE 1.80* -- -- -- 1.63*  CKTOTAL -- 220* 265* 258* --  CKMB -- 13.3* 19.4* 19.8* --  TROPONINI -- 2.32* 2.11* 2.83* --    Estimated Creatinine Clearance: 29.2 ml/min (by C-G formula based on Cr of 1.8).  Assessment: 71 year old admitted with CP and SOB.  On Coumadin PTA for Afib.  Planning cardioversion  INR is supra-therapeutic at 3.67  Dose PTA = 7.5 mg daily  Goal of Therapy:  INR 2-3 Monitor platelets by anticoagulation protocol: Yes   Plan:  1) No Coumadin today 2) Daily PT / INR  Thank you.  Elwin Sleight 11/16/2011,8:41 AM

## 2011-11-16 NOTE — Anesthesia Preprocedure Evaluation (Addendum)
Anesthesia Evaluation  Patient identified by MRN, date of birth, ID band Patient awake    Reviewed: Allergy & Precautions, H&P , NPO status , Patient's Chart, lab work & pertinent test results, reviewed documented beta blocker date and time   History of Anesthesia Complications Negative for: history of anesthetic complications  Airway Mallampati: II TM Distance: >3 FB Neck ROM: Full    Dental  (+) Edentulous Lower, Upper Dentures and Dental Advisory Given   Pulmonary shortness of breath and at rest,          Cardiovascular Exercise Tolerance: Poor hypertension, Pt. on medications + CAD, + Past MI and +CHF + dysrhythmias  EF 60-65%   Neuro/Psych    GI/Hepatic negative GI ROS, Neg liver ROS,   Endo/Other  negative endocrine ROS  Renal/GU CRFRenal diseasenegative Renal ROS     Musculoskeletal negative musculoskeletal ROS (+)   Abdominal   Peds  Hematology negative hematology ROS (+)   Anesthesia Other Findings   Reproductive/Obstetrics                        Anesthesia Physical Anesthesia Plan  ASA: III  Anesthesia Plan: General   Post-op Pain Management:    Induction: Intravenous  Airway Management Planned: Mask  Additional Equipment:   Intra-op Plan:   Post-operative Plan:   Informed Consent: I have reviewed the patients History and Physical, chart, labs and discussed the procedure including the risks, benefits and alternatives for the proposed anesthesia with the patient or authorized representative who has indicated his/her understanding and acceptance.     Plan Discussed with: CRNA and Surgeon  Anesthesia Plan Comments:         Anesthesia Quick Evaluation

## 2011-11-17 ENCOUNTER — Encounter (HOSPITAL_COMMUNITY): Payer: Self-pay | Admitting: Certified Registered Nurse Anesthetist

## 2011-11-17 ENCOUNTER — Encounter (HOSPITAL_COMMUNITY): Payer: Self-pay | Admitting: *Deleted

## 2011-11-17 ENCOUNTER — Encounter (HOSPITAL_COMMUNITY)
Admission: EM | Disposition: A | Discharge status: Home / Self Care | Payer: Self-pay | Source: Home / Self Care | Attending: Cardiology

## 2011-11-17 ENCOUNTER — Inpatient Hospital Stay (HOSPITAL_COMMUNITY): Payer: Medicare Other | Admitting: *Deleted

## 2011-11-17 DIAGNOSIS — I4891 Unspecified atrial fibrillation: Secondary | ICD-10-CM

## 2011-11-17 HISTORY — PX: CARDIOVERSION: SHX1299

## 2011-11-17 LAB — BASIC METABOLIC PANEL
Calcium: 11.4 mg/dL — ABNORMAL HIGH (ref 8.4–10.5)
Creatinine, Ser: 1.69 mg/dL — ABNORMAL HIGH (ref 0.50–1.10)
GFR calc Af Amer: 34 mL/min — ABNORMAL LOW (ref >90)
GFR calc non Af Amer: 29 mL/min — ABNORMAL LOW (ref >90)
Sodium: 139 mEq/L (ref 135–145)

## 2011-11-17 LAB — PROTIME-INR
INR: 2.62 — ABNORMAL HIGH (ref 0.00–1.49)
Prothrombin Time: 28.4 seconds — ABNORMAL HIGH (ref 11.6–15.2)

## 2011-11-17 SURGERY — CARDIOVERSION
Anesthesia: Monitor Anesthesia Care | Wound class: Clean

## 2011-11-17 MED ORDER — WARFARIN SODIUM 5 MG PO TABS
5.0000 mg | ORAL_TABLET | Freq: Once | ORAL | Status: AC
  Administered 2011-11-17: 5 mg via ORAL
  Filled 2011-11-17: qty 1

## 2011-11-17 MED ORDER — SODIUM CHLORIDE 0.9 % IJ SOLN
3.0000 mL | Freq: Two times a day (BID) | INTRAMUSCULAR | Status: DC
  Administered 2011-11-17: 3 mL via INTRAVENOUS

## 2011-11-17 MED ORDER — SODIUM CHLORIDE 0.9 % IJ SOLN: 3.0000 mL | INTRAMUSCULAR | Status: DC | PRN

## 2011-11-17 MED ORDER — SODIUM CHLORIDE 0.9 % IV SOLN
250.0000 mL | INTRAVENOUS | Status: DC
  Administered 2011-11-17: 250 mL via INTRAVENOUS

## 2011-11-17 MED ORDER — HYDROMORPHONE HCL PF 1 MG/ML IJ SOLN: 0.2500 mg | INTRAMUSCULAR | Status: DC | PRN

## 2011-11-17 MED ORDER — ONDANSETRON HCL 4 MG/2ML IJ SOLN: 4.0000 mg | Freq: Once | INTRAMUSCULAR | Status: AC | PRN

## 2011-11-17 MED ORDER — HYDROCORTISONE 1 % EX CREA
1.0000 "application " | TOPICAL_CREAM | Freq: Three times a day (TID) | CUTANEOUS | Status: DC | PRN
  Filled 2011-11-17: qty 28

## 2011-11-17 MED ORDER — AMIODARONE HCL 200 MG PO TABS
400.0000 mg | ORAL_TABLET | Freq: Two times a day (BID) | ORAL | Status: DC
  Administered 2011-11-17 – 2011-11-18 (×3): 400 mg via ORAL
  Filled 2011-11-17 (×4): qty 2

## 2011-11-17 MED ORDER — SODIUM CHLORIDE 0.9 % IV SOLN
INTRAVENOUS | Status: DC | PRN
  Administered 2011-11-17: 14:00:00 via INTRAVENOUS

## 2011-11-17 MED ORDER — PROPOFOL 10 MG/ML IV BOLUS
INTRAVENOUS | Status: DC | PRN
  Administered 2011-11-17: 80 mg via INTRAVENOUS

## 2011-11-17 MED ORDER — WARFARIN - PHARMACIST DOSING INPATIENT
Freq: Every day | Status: DC
  Administered 2011-11-17: 18:00:00

## 2011-11-17 NOTE — Transfer of Care (Signed)
Immediate Anesthesia Transfer of Care Note  Patient: [name]  Procedure(s) Performed: Procedure(s) (LRB): CARDIOVERSION (N/A)  Patient Location: Nursing Unit  Anesthesia Type: General  Level of Consciousness: awake, alert , oriented and patient cooperative  Airway & Oxygen Therapy: Patient Spontanous Breathing and Patient connected to nasal cannula oxygen  Post-op Assessment: Report given to PACU RN, Post -op Vital signs reviewed and stable and Patient moving all extremities  Post vital signs: Reviewed and stable  Complications: No apparent anesthesia complications

## 2011-11-17 NOTE — Op Note (Signed)
EP procedure Note   Pre procedure Diagnosis:  Persistent Atrial fibrillation Post procedure Diagnosis:  Same  Procedures:  Electrical cardioversion  Description:  Informed, written consent was obtained for cardioversion.  Adequate IV acces and airway support were assured.  The patient was adequately sedated with intravenous propofol as outlined in the anesthesia report.  The patient presented today in atrial fibrillation.  She was successfully cardioverted to sinus rhythm with a single synchronized biphasic 200J shock delivered with cardioversion electrodes placed in the anterior/posterior configuration.  She remains in sinus rhythm thereafter.  There were no early apparent complications.  Conclusions:  1.  Successful cardioversion of afib to sinus rhythm 2.  No early apparent complications.   Malikah Principato,MD 2:24 PM 11/17/2011

## 2011-11-17 NOTE — Progress Notes (Signed)
   SUBJECTIVE:  Breathing baseline. She denies any chest pain.   PHYSICAL EXAM Filed Vitals:   11/16/11 0944 11/16/11 1354 11/16/11 2144 11/17/11 0421  BP: 119/79 98/79 102/60 131/76  Pulse: 104 102 86 91  Temp:  98 F (36.7 C) 98.2 F (36.8 C) 98.2 F (36.8 C)  TempSrc:  Oral Oral Oral  Resp: 20 20 20 18   Height:      Weight:    190 lb 11.2 oz (86.5 kg)  SpO2: 99% 99% 100% 100%   General:  No distress Lungs:  Clear Heart:  Irregular Abdomen:  Positive bowel sounds, no rebound no guarding Extremities:  No edema.    LABS: Lab Results  Component Value Date   CKTOTAL 220* 11/15/2011   CKMB 13.3* 11/15/2011   TROPONINI 2.32* 11/15/2011   No results found for this or any previous visit (from the past 24 hour(s)).  Intake/Output Summary (Last 24 hours) at 11/17/11 0656 Last data filed at 11/17/11 0335  Gross per 24 hour  Intake 1410.02 ml  Output   1550 ml  Net -139.98 ml   Telemetry:   Atrial fibrillation.  Rate controlled.  11/17/2011  ASSESSMENT AND PLAN:  Atrial fibrillation:  DCCV today.  I will switch to PO amiodarone.  HCM:  Changed to PO Lasix yesterday.  Echo with hyperdynamic LV.  LVH as previously noted.    NQWMI:  Enzymes are elevated.  She had this same presentation in the past with elevated enzymes and nonobstructive disease on cardiac cath.  No further ischemia work up at this point.   CKD:  Creat up slightly.  Repeat in AM.  Keep I/O about even.  Fayrene Fearing [facility] 11/17/2011 6:56 AM

## 2011-11-17 NOTE — Anesthesia Postprocedure Evaluation (Signed)
  Anesthesia Post-op Note  Patient: [name]  Procedure(s) Performed: Procedure(s) (LRB): CARDIOVERSION (N/A)  Patient Location: Nursing Unit  Anesthesia Type: General  Level of Consciousness: awake, alert , oriented and patient cooperative  Airway and Oxygen Therapy: Patient Spontanous Breathing and Patient connected to nasal cannula oxygen  Post-op Pain: none  Post-op Assessment: Post-op Vital signs reviewed, Patient's Cardiovascular Status Stable, Respiratory Function Stable and Patent Airway  Post-op Vital Signs: Reviewed and stable  Complications: No apparent anesthesia complications

## 2011-11-17 NOTE — Progress Notes (Signed)
ANTICOAGULATION CONSULT NOTE - Follow-Up Consult  Pharmacy Consult for Coumadin Indication: atrial fibrillation  No Known Allergies   Labs:  Basename 11/17/11 0500 11/16/11 0553 11/15/11 2322 11/15/11 1317 11/15/11 0947 11/15/11 0123  HGB -- -- -- -- -- 10.8*  HCT -- -- -- -- -- 33.6*  PLT -- -- -- -- -- 269  APTT -- -- -- -- -- --  LABPROT 28.4* 37.0* -- -- -- 41.7*  INR 2.62* 3.67* -- -- -- 4.28*  HEPARINUNFRC -- -- -- -- -- --  CREATININE 1.69* 1.80* -- -- -- 1.63*  CKTOTAL -- -- 220* 265* 258* --  CKMB -- -- 13.3* 19.4* 19.8* --  TROPONINI -- -- 2.32* 2.11* 2.83* --    Estimated Creatinine Clearance: 31.2 ml/min (by C-G formula based on Cr of 1.69).  Assessment: 71 year old admitted with CP and SOB.  On Coumadin PTA for Afib.  Planning cardioversion  INR is WNL 2.62  Dose PTA = 7.5 mg daily  Goal of Therapy:  INR 2-3 Monitor platelets by anticoagulation protocol: Yes   Plan:  1) Start Coumadin 5mg  X 1 dose 2) Check INR in AM  Thank you.  Franchot Erichsen, PharmD  Clinical Pharmacist [phone])

## 2011-11-17 NOTE — Preoperative (Signed)
Beta Blockers   Reason not to administer Beta Blockers:Not Applicable 

## 2011-11-17 NOTE — Progress Notes (Signed)
Patient cardioverted to NSR by MD Allred and [place]. Tolerated it well. Patient is alert and responsive.

## 2011-11-18 ENCOUNTER — Encounter (HOSPITAL_COMMUNITY): Payer: Self-pay | Admitting: Cardiology

## 2011-11-18 LAB — BASIC METABOLIC PANEL
Calcium: 11.5 mg/dL — ABNORMAL HIGH (ref 8.4–10.5)
Chloride: 102 mEq/L (ref 96–112)
Creatinine, Ser: 1.61 mg/dL — ABNORMAL HIGH (ref 0.50–1.10)
GFR calc Af Amer: 36 mL/min — ABNORMAL LOW (ref >90)
Sodium: 138 mEq/L (ref 135–145)

## 2011-11-18 LAB — PROTIME-INR: Prothrombin Time: 22.4 seconds — ABNORMAL HIGH (ref 11.6–15.2)

## 2011-11-18 MED ORDER — METOPROLOL TARTRATE 25 MG PO TABS: 25.0000 mg | ORAL_TABLET | Freq: Two times a day (BID) | ORAL | Status: DC

## 2011-11-18 MED ORDER — AMIODARONE HCL 400 MG PO TABS: ORAL_TABLET | ORAL | Status: DC

## 2011-11-18 MED ORDER — WARFARIN SODIUM 5 MG PO TABS: 5.0000 mg | ORAL_TABLET | Freq: Every day | ORAL | Status: DC

## 2011-11-18 MED ORDER — AMIODARONE HCL 200 MG PO TABS: 200.0000 mg | ORAL_TABLET | Freq: Every day | ORAL | Status: DC

## 2011-11-18 NOTE — Progress Notes (Signed)
Patient's tele and IV has been discontinued, discharge instructions has been reviewed with patient and patient verbalizes understanding of discharge instructions__________________________________________________________________D. Temitayo Covalt RN 

## 2011-11-18 NOTE — Discharge Summary (Signed)
Discharge Summary   Patient ID: [name] MRN: [id number], DOB/AGE: 1941/02/04 71 y.o.  Primary MD: Oliver Barre, MD Primary Cardiologist: Rollene Rotunda MD Admit date: 11/15/2011 D/C date:     11/18/2011      Primary Discharge Diagnoses:  1. Atrial Fibrillation w/ RVR  - H/o PAF on chronic coumadin  - DCCV this admission with conversion to NSR  - Initiated on amiodarone  2. Type II NSTEMI secondary to #1  3. Acute on Chronic Diastolic CHF secondary to #1  4. Acute on Chronic Renal Insufficiency (CKD stage 4)   Secondary Discharge Diagnoses:  1. Hypertrophic Obstructive Cardiomyopathy 2. Nonobstructive CAD - cath 11/11: LAD 40%, mid-dist 25-30%; prox CFX 30%, mid 40%; OM 50%; PDA 50%; PLV 50% 3. Hypertension   4. Tobacco abuse   5. Iron deficiency anemia  6. Third degree uterine prolapse  7. Hx MRSA infection   Allergies No Known Allergies  Diagnostic Studies/Procedures:   11/16/11 - 2D Echo Study Conclusions: - Left ventricle: The cavity size was normal. There was severe concentric hypertrophy. Systolic function was vigorous. The estimated ejection fraction was in the range of 65% to 70%. There was an LV mid cavity gradient reaching a peak of81 mmHg. Wall motion was normal; there were no regional wall motion abnormalities. Indeterminate diastolic function. - Aortic valve: Poorly visualized. Probably trileaflet. Probably no significant aortic valve stenosis. - Mitral valve: Moderately calcified annulus. Mild regurgitation. - Left atrium: The atrium was moderately dilated. - Right ventricle: The cavity size was normal. Systolic function was normal. - Right atrium: The atrium was mildly dilated. - Tricuspid valve: Peak RV-RA gradient: 32mm Hg (S). - Pulmonary arteries: PA peak pressure: 37mm Hg (S). - Inferior vena cava: The vessel was normal in size; the respirophasic diameter changes were in the normal range (= 50%); findings are consistent with normal central venous  pressure. - Pericardium, extracardiac: A trivial pericardial effusion was identified.  Impressions:  The patient was tachycardic, possibly with atrial fibrillation (non-sinus rhythm). Normal LV size with severe LV hypertrophy. EF 65-70% with end-systolic cavity obliteration. There was a mid-cavity gradient up to 81 mmHg. The aortic valve was poorly visualized but doubt significant aortic stenosis. Normal RV size and systolic function with mild pulmonary hypertension.  11/17/11 - Electrical cardioversion  Description:The patient presented today in atrial fibrillation. She was successfully cardioverted to sinus rhythm with a single synchronized biphasic 200J shock delivered with cardioversion electrodes placed in the anterior/posterior configuration. She remains in sinus rhythm thereafter. There were no early apparent complications.  Conclusions:  1. Successful cardioversion of afib to sinus rhythm  2. No early apparent complications.   History of Present Illness: 71 y.o. female w/ the above medical problems who presented to [facility] on 11/15/11 with complaints of palpitations, sob, and chest pain and found to be in A.fib w/ rvr.  Hospital Course: EKG revealed atrial fibrillation 147bpm, LVH with repol and diffuse STD and TWI. CXR showed cardiomegaly and bilateral airspace disease worrisome for volume overload. Labs were significant for normal cardiac enzymes, BNP 662, INR 4.28, Crt 1.63. She was felt to have acute on chronic diastolic CHF in the setting of uncontrolled a.fib. She was placed on IV amiodarone, low dose BB, IV lasix and admitted for further evaluation and treatment.   Her chest pain, sob, and palpitations improved with rate control and IV diuresis. Cardiac enzymes turned positive, but it was felt to be type II NSTEMI in the setting of a.fib w/  rvr. Given she had the same presentation in the past with nonobstructive disease on cardiac cath there was no indication for further  ischemic evaluation at this time. Echo revealed severe LVH, EF 65-70%, mid-cavity gradient up to . Her creatinine increased slightly, but was decreasing close to baseline at time of discharge. Lasix and amiodarone were switched to oral dosing. Cardioversion was performed on 11/17/11 with successful conversion to NSR. She tolerated the procedure well without complications. She remained in NSR on oral amiodarone.   She was seen and evaluated by Dr. Antoine Poche who felt she was stable for discharge home with plans for follow up as scheduled below.  Discharge Vitals: Blood pressure 117/65, pulse 73, temperature 98 F (36.7 C), temperature source Oral, resp. rate 18, height 5\' 2"  (1.575 m), weight 190 lb 7.6 oz (86.4 kg), SpO2 96.00%.  Labs: Component Value Date   WBC 8.4 11/15/2011   HGB 10.8* 11/15/2011   HCT 33.6* 11/15/2011   MCV 81.2 11/15/2011   PLT 269 11/15/2011    Lab 11/18/11 0641  NA 138  K 4.3  CL 102  CO2 25  BUN 22  CREATININE 1.61*  CALCIUM 11.5*  GLUCOSE 156*     11/15/2011 01:25 11/15/2011 09:47 11/15/2011 13:17 11/15/2011 23:22  CK, MB 5.3 (H) 19.8 (HH) 19.4 (HH) 13.3 (HH)  CK Total 198 (H) 258 (H) 265 (H) 220 (H)  Troponin I <0.30 2.83 (HH) 2.11 (HH) 2.32 (HH)     11/18/2011 06:41  Prothrombin Time 22.4 (H)  INR 1.93 (H)    11/15/2011 01:25  Pro B Natriuretic peptide (BNP) 661.9 (H)     Discharge Medications   Medication List  As of 11/18/2011  9:35 AM   TAKE these medications         amiodarone 400 MG tablet   Commonly known as: PACERONE   Take 400mg  twice daily for 2 wks (11/17/11 - 11/30/11)  Then take 400mg  daily for 2 wks (12/01/11 - 12/15/11)  Then take 200mg  daily starting 12/16/11      amiodarone 200 MG tablet   Commonly known as: PACERONE   Take 1 tablet (200 mg total) by mouth daily. Take 200mg  daily starting 12/16/11   Start taking on: 12/16/2011      ferrous sulfate 325 (65 FE) MG tablet   Take 325 mg by mouth 2 (two) times daily.      furosemide 20 MG tablet    Commonly known as: LASIX   Take 20 mg by mouth daily.      metoprolol tartrate 25 MG tablet   Commonly known as: LOPRESSOR   Take 1 tablet (25 mg total) by mouth 2 (two) times daily.      nitroGLYCERIN 0.4 MG SL tablet   Commonly known as: NITROSTAT   Place 0.4 mg under the tongue every 5 (five) minutes as needed. For chest pain      pantoprazole 40 MG tablet   Commonly known as: PROTONIX   Take 40 mg by mouth daily.      rosuvastatin 10 MG tablet   Commonly known as: CRESTOR   Take 10 mg by mouth at bedtime.      warfarin 5 MG tablet   Commonly known as: COUMADIN   Take 1 tablet (5 mg total) by mouth daily. Take 5mg  daily and have your INR checked on Monday 7/15 for further instructions on dosing            Disposition   Discharge Orders  Future Appointments: Provider: Department: Dept Phone: Center:   11/22/2011 1:00 PM [facility] [phone] None   12/02/2011 10:10 AM Beatrice Lecher, PA [facility] [phone] [id number]     Future Orders Please Complete By Expires   Diet - low sodium heart healthy      Increase activity slowly      Discharge instructions      Comments:   **PLEASE REMEMBER TO BRING ALL OF YOUR MEDICATIONS TO EACH OF YOUR FOLLOW-UP OFFICE VISITS.  * Take coumadin 5mg  daily and have your INR checked on Monday 7/15 for further instructions on dosing * Take Amiodarone 400mg  twice daily for 2 wks (11/17/11 - 11/30/11)   Then take 400mg  daily for 2 wks (12/01/11 - 12/15/11)    Then take 200mg  daily starting 12/16/11     Follow-up Information    Follow up with [facility] on 11/22/2011. (1:00)    Contact information:   [facility] [address] [place] [address] [phone]      Follow up with Tereso Newcomer, PA on 12/02/2011. (10:10)    Contact information:   [facility] [address] [place] [address] [phone]             Outstanding Labs/Studies:  1. INR 2. Close surveillance of PFTs/LFTs/TFTs etc, as she was initiated on amiodarone  Duration of Discharge Encounter: Greater than 30 minutes including physician and PA time.  Signed, HOPE, JESSICA PA-C 11/18/2011, 9:35 AM  Patient seen and examined.  Plan as discussed in my rounding note for today and outlined above. Rollene Rotunda  11/18/2011  12:53 PM

## 2011-11-18 NOTE — Progress Notes (Signed)
    SUBJECTIVE:  Breathing back to her baseline. She denies any chest pain.   PHYSICAL EXAM Filed Vitals:   11/17/11 1426 11/17/11 1451 11/17/11 2015 11/18/11 0512  BP:  90/61 122/80 140/72  Pulse:  66 66 62  Temp:  97.9 F (36.6 C) 98.3 F (36.8 C) 98.5 F (36.9 C)  TempSrc:  Oral Oral Oral  Resp: 18 20 26 20   Height:      Weight:    190 lb 7.6 oz (86.4 kg)  SpO2: 100% 100% 100% 100%   General:  No distress Lungs:  Clear Heart:  Irregular Abdomen:  Positive bowel sounds, no rebound no guarding Extremities:  No edema.    LABS: Lab Results  Component Value Date   CKTOTAL 220* 11/15/2011   CKMB 13.3* 11/15/2011   TROPONINI 2.32* 11/15/2011   No results found for this or any previous visit (from the past 24 hour(s)).  Intake/Output Summary (Last 24 hours) at 11/18/11 0657 Last data filed at 11/17/11 2300  Gross per 24 hour  Intake    693 ml  Output    950 ml  Net   -257 ml   Telemetry:   NSR.  11/18/2011  ASSESSMENT AND PLAN:  Atrial fibrillation:  DCCV yesterday and switched to PO amiodarone.  OK to discharge on amiodarone taper (400 bid x 2 wks the 400 daily x 2 wks then 200 daily).  Warfarin per our clinic  HCM:  Euvolemic.  Continue current therapy.     NQWMI:  Enzymes are elevated.  She had this same presentation in the past with elevated enzymes and nonobstructive disease on cardiac cath.  No further ischemia work up at this point.  Discontinue ASA  CKD:  .  Repeat creat pending this am.  Rollene Rotunda 11/18/2011 6:57 AM

## 2011-11-22 ENCOUNTER — Ambulatory Visit: Payer: Self-pay | Admitting: Internal Medicine

## 2011-11-22 DIAGNOSIS — I4891 Unspecified atrial fibrillation: Secondary | ICD-10-CM

## 2011-11-24 ENCOUNTER — Telehealth: Payer: Self-pay | Admitting: Cardiology

## 2011-11-24 NOTE — Telephone Encounter (Signed)
error 

## 2011-11-30 ENCOUNTER — Ambulatory Visit: Payer: Self-pay | Admitting: Cardiology

## 2011-11-30 DIAGNOSIS — I4891 Unspecified atrial fibrillation: Secondary | ICD-10-CM

## 2011-11-30 LAB — POCT INR: INR: 2.5

## 2011-12-02 ENCOUNTER — Ambulatory Visit: Payer: Medicare Other | Admitting: Physician Assistant

## 2011-12-10 ENCOUNTER — Ambulatory Visit (INDEPENDENT_AMBULATORY_CARE_PROVIDER_SITE_OTHER): Payer: Medicare Other | Admitting: Cardiology

## 2011-12-10 DIAGNOSIS — I4891 Unspecified atrial fibrillation: Secondary | ICD-10-CM

## 2011-12-15 ENCOUNTER — Encounter: Payer: Medicare Other | Admitting: Physician Assistant

## 2011-12-20 ENCOUNTER — Ambulatory Visit: Payer: Self-pay | Admitting: Cardiovascular Disease

## 2011-12-20 ENCOUNTER — Telehealth: Payer: Self-pay | Admitting: *Deleted

## 2011-12-20 DIAGNOSIS — I4891 Unspecified atrial fibrillation: Secondary | ICD-10-CM

## 2011-12-28 ENCOUNTER — Encounter: Payer: Medicare Other | Admitting: Physician Assistant

## 2012-01-07 ENCOUNTER — Ambulatory Visit (INDEPENDENT_AMBULATORY_CARE_PROVIDER_SITE_OTHER): Payer: Medicare Other

## 2012-01-07 DIAGNOSIS — I4891 Unspecified atrial fibrillation: Secondary | ICD-10-CM

## 2012-01-09 DIAGNOSIS — A0472 Enterocolitis due to Clostridium difficile, not specified as recurrent: Secondary | ICD-10-CM

## 2012-01-09 HISTORY — DX: Enterocolitis due to Clostridium difficile, not specified as recurrent: A04.72

## 2012-01-16 ENCOUNTER — Other Ambulatory Visit: Payer: Self-pay | Admitting: Cardiology

## 2012-01-17 ENCOUNTER — Other Ambulatory Visit: Payer: Self-pay | Admitting: Cardiology

## 2012-01-19 ENCOUNTER — Telehealth: Payer: Self-pay | Admitting: *Deleted

## 2012-01-29 ENCOUNTER — Emergency Department (HOSPITAL_COMMUNITY): Payer: Medicare Other

## 2012-01-29 ENCOUNTER — Encounter (HOSPITAL_COMMUNITY): Payer: Self-pay | Admitting: *Deleted

## 2012-01-29 ENCOUNTER — Inpatient Hospital Stay (HOSPITAL_COMMUNITY)
Admission: EM | Admit: 2012-01-29 | Discharge: 2012-02-02 | DRG: 208 | Disposition: A | Discharge status: Home / Self Care | Payer: Medicare Other | Attending: Internal Medicine | Admitting: Internal Medicine

## 2012-01-29 ENCOUNTER — Other Ambulatory Visit: Payer: Self-pay | Admitting: Cardiology

## 2012-01-29 DIAGNOSIS — I2510 Atherosclerotic heart disease of native coronary artery without angina pectoris: Secondary | ICD-10-CM | POA: Diagnosis present

## 2012-01-29 DIAGNOSIS — N185 Chronic kidney disease, stage 5: Secondary | ICD-10-CM | POA: Diagnosis present

## 2012-01-29 DIAGNOSIS — R791 Abnormal coagulation profile: Secondary | ICD-10-CM | POA: Diagnosis present

## 2012-01-29 DIAGNOSIS — I10 Essential (primary) hypertension: Secondary | ICD-10-CM | POA: Diagnosis present

## 2012-01-29 DIAGNOSIS — E876 Hypokalemia: Secondary | ICD-10-CM

## 2012-01-29 DIAGNOSIS — I5033 Acute on chronic diastolic (congestive) heart failure: Secondary | ICD-10-CM | POA: Diagnosis present

## 2012-01-29 DIAGNOSIS — J9600 Acute respiratory failure, unspecified whether with hypoxia or hypercapnia: Principal | ICD-10-CM | POA: Diagnosis present

## 2012-01-29 DIAGNOSIS — E785 Hyperlipidemia, unspecified: Secondary | ICD-10-CM | POA: Diagnosis present

## 2012-01-29 DIAGNOSIS — I509 Heart failure, unspecified: Secondary | ICD-10-CM | POA: Diagnosis present

## 2012-01-29 DIAGNOSIS — D509 Iron deficiency anemia, unspecified: Secondary | ICD-10-CM | POA: Diagnosis present

## 2012-01-29 DIAGNOSIS — R0602 Shortness of breath: Secondary | ICD-10-CM

## 2012-01-29 DIAGNOSIS — N179 Acute kidney failure, unspecified: Secondary | ICD-10-CM | POA: Diagnosis present

## 2012-01-29 DIAGNOSIS — E872 Acidosis, unspecified: Secondary | ICD-10-CM | POA: Diagnosis present

## 2012-01-29 DIAGNOSIS — A0472 Enterocolitis due to Clostridium difficile, not specified as recurrent: Secondary | ICD-10-CM | POA: Diagnosis present

## 2012-01-29 DIAGNOSIS — I428 Other cardiomyopathies: Secondary | ICD-10-CM | POA: Diagnosis present

## 2012-01-29 DIAGNOSIS — R739 Hyperglycemia, unspecified: Secondary | ICD-10-CM | POA: Diagnosis present

## 2012-01-29 DIAGNOSIS — I5032 Chronic diastolic (congestive) heart failure: Secondary | ICD-10-CM

## 2012-01-29 DIAGNOSIS — I252 Old myocardial infarction: Secondary | ICD-10-CM

## 2012-01-29 DIAGNOSIS — J810 Acute pulmonary edema: Secondary | ICD-10-CM | POA: Diagnosis present

## 2012-01-29 DIAGNOSIS — Z0000 Encounter for general adult medical examination without abnormal findings: Secondary | ICD-10-CM

## 2012-01-29 DIAGNOSIS — I120 Hypertensive chronic kidney disease with stage 5 chronic kidney disease or end stage renal disease: Secondary | ICD-10-CM | POA: Diagnosis present

## 2012-01-29 DIAGNOSIS — I4891 Unspecified atrial fibrillation: Secondary | ICD-10-CM | POA: Diagnosis present

## 2012-01-29 DIAGNOSIS — J9690 Respiratory failure, unspecified, unspecified whether with hypoxia or hypercapnia: Secondary | ICD-10-CM

## 2012-01-29 DIAGNOSIS — F17200 Nicotine dependence, unspecified, uncomplicated: Secondary | ICD-10-CM | POA: Diagnosis present

## 2012-01-29 DIAGNOSIS — N289 Disorder of kidney and ureter, unspecified: Secondary | ICD-10-CM | POA: Diagnosis present

## 2012-01-29 DIAGNOSIS — R5381 Other malaise: Secondary | ICD-10-CM

## 2012-01-29 LAB — URINALYSIS, MICROSCOPIC ONLY
Glucose, UA: 100 mg/dL — AB
pH: 7.5 (ref 5.0–8.0)

## 2012-01-29 LAB — COMPREHENSIVE METABOLIC PANEL
ALT: 17 U/L (ref 0–35)
CO2: 17 mEq/L — ABNORMAL LOW (ref 19–32)
Calcium: 11.8 mg/dL — ABNORMAL HIGH (ref 8.4–10.5)
Creatinine, Ser: 2.06 mg/dL — ABNORMAL HIGH (ref 0.50–1.10)
GFR calc Af Amer: 27 mL/min — ABNORMAL LOW (ref >90)
GFR calc non Af Amer: 23 mL/min — ABNORMAL LOW (ref >90)
Glucose, Bld: 321 mg/dL — ABNORMAL HIGH (ref 70–99)
Total Bilirubin: 0.2 mg/dL — ABNORMAL LOW (ref 0.3–1.2)

## 2012-01-29 LAB — PROTIME-INR
INR: 3.13 — ABNORMAL HIGH (ref 0.00–1.49)
Prothrombin Time: 30.5 seconds — ABNORMAL HIGH (ref 11.6–15.2)

## 2012-01-29 LAB — CBC WITH DIFFERENTIAL/PLATELET
Eosinophils Relative: 2 % (ref 0–5)
HCT: 39.9 % (ref 36.0–46.0)
Hemoglobin: 12.6 g/dL (ref 12.0–15.0)
Lymphocytes Relative: 37 % (ref 12–46)
Lymphs Abs: 5.8 10*3/uL — ABNORMAL HIGH (ref 0.7–4.0)
MCV: 83.8 fL (ref 78.0–100.0)
Monocytes Absolute: 1.1 10*3/uL — ABNORMAL HIGH (ref 0.1–1.0)
Monocytes Relative: 7 % (ref 3–12)
RBC: 4.76 MIL/uL (ref 3.87–5.11)
WBC: 15.5 10*3/uL — ABNORMAL HIGH (ref 4.0–10.5)

## 2012-01-29 MED ORDER — FENTANYL CITRATE 0.05 MG/ML IJ SOLN
INTRAMUSCULAR | Status: AC
  Filled 2012-01-29: qty 4

## 2012-01-29 MED ORDER — NITROGLYCERIN IN D5W 200-5 MCG/ML-% IV SOLN
2.0000 ug/min | INTRAVENOUS | Status: DC
  Administered 2012-01-29: 10 ug/min via INTRAVENOUS

## 2012-01-29 MED ORDER — PROPOFOL 10 MG/ML IV EMUL
5.0000 ug/kg/min | Freq: Once | INTRAVENOUS | Status: AC
  Administered 2012-01-30: 10 ug/kg/min via INTRAVENOUS
  Filled 2012-01-29: qty 100

## 2012-01-29 MED ORDER — IPRATROPIUM BROMIDE 0.02 % IN SOLN
0.5000 mg | RESPIRATORY_TRACT | Status: AC
  Filled 2012-01-29: qty 2.5

## 2012-01-29 MED ORDER — ALBUTEROL SULFATE (5 MG/ML) 0.5% IN NEBU
INHALATION_SOLUTION | RESPIRATORY_TRACT | Status: AC
  Filled 2012-01-29: qty 2

## 2012-01-29 MED ORDER — METHYLPREDNISOLONE SODIUM SUCC 125 MG IJ SOLR
INTRAMUSCULAR | Status: AC
  Filled 2012-01-29: qty 2

## 2012-01-29 MED ORDER — FUROSEMIDE 10 MG/ML IJ SOLN
40.0000 mg | Freq: Once | INTRAMUSCULAR | Status: AC
  Administered 2012-01-29: 40 mg via INTRAVENOUS

## 2012-01-29 MED ORDER — METHYLPREDNISOLONE SODIUM SUCC 125 MG IJ SOLR: 125.0000 mg | INTRAMUSCULAR | Status: AC

## 2012-01-29 MED ORDER — NITROGLYCERIN IN D5W 200-5 MCG/ML-% IV SOLN
INTRAVENOUS | Status: AC
  Administered 2012-01-29: 10 ug/min via INTRAVENOUS
  Filled 2012-01-29: qty 250

## 2012-01-29 MED ORDER — ALBUTEROL SULFATE (5 MG/ML) 0.5% IN NEBU: 5.0000 mg | INHALATION_SOLUTION | RESPIRATORY_TRACT | Status: DC

## 2012-01-29 MED ORDER — SUCCINYLCHOLINE CHLORIDE 20 MG/ML IJ SOLN
INTRAMUSCULAR | Status: AC
  Administered 2012-01-29: 130 mg
  Filled 2012-01-29: qty 10

## 2012-01-29 MED ORDER — ALBUTEROL SULFATE (5 MG/ML) 0.5% IN NEBU
10.0000 mg | INHALATION_SOLUTION | RESPIRATORY_TRACT | Status: AC
  Filled 2012-01-29: qty 2

## 2012-01-29 MED ORDER — IPRATROPIUM BROMIDE 0.02 % IN SOLN
RESPIRATORY_TRACT | Status: AC
  Filled 2012-01-29: qty 2.5

## 2012-01-29 MED ORDER — ETOMIDATE 2 MG/ML IV SOLN
INTRAVENOUS | Status: AC
  Administered 2012-01-29: 25 mg
  Filled 2012-01-29: qty 20

## 2012-01-29 MED ORDER — FUROSEMIDE 10 MG/ML IJ SOLN
INTRAMUSCULAR | Status: AC
  Administered 2012-01-29: 40 mg via INTRAVENOUS
  Filled 2012-01-29: qty 8

## 2012-01-29 MED ORDER — ROCURONIUM BROMIDE 50 MG/5ML IV SOLN
INTRAVENOUS | Status: AC
  Filled 2012-01-29: qty 2

## 2012-01-29 MED ORDER — LIDOCAINE HCL (CARDIAC) 20 MG/ML IV SOLN
INTRAVENOUS | Status: AC
  Filled 2012-01-29: qty 5

## 2012-01-29 MED ORDER — PROPOFOL 10 MG/ML IV BOLUS
INTRAVENOUS | Status: AC
  Filled 2012-01-29: qty 40

## 2012-01-29 MED ORDER — MIDAZOLAM HCL 2 MG/2ML IJ SOLN
2.0000 mg | Freq: Once | INTRAMUSCULAR | Status: AC
  Administered 2012-01-29: 2 mg via INTRAVENOUS

## 2012-01-29 MED ORDER — MIDAZOLAM HCL 2 MG/2ML IJ SOLN
INTRAMUSCULAR | Status: AC
  Administered 2012-01-29: 2 mg
  Filled 2012-01-29: qty 4

## 2012-01-29 NOTE — Progress Notes (Signed)
I was asked to provide support to the patient's daughter by the patient advocate.  I found the patient's daughter sitting outside the patient's room crying.  I sat and talked with her at length.  She shared her struggles with feeling alone even though she has many siblings.  Talked about her battle with cancer and her fears for her mother.  Feels the weight of all her family turning to her for support but says they are not there to support her when she needs them.  Provided support and encouragement and prayed with her before I left.  Follow up recommended.  Rutherford Nail Chaplain

## 2012-01-29 NOTE — ED Notes (Signed)
Pt was brought in by EMS with onset of shortness of breath. Pt was ambulatory at the scene and talking. Conditioned worsened enroute with increased work of breathing and unable to speak due to shortness of breath

## 2012-01-29 NOTE — ED Provider Notes (Signed)
I saw and evaluated the patient, reviewed the resident's note and I agree with the findings and plan. Had sudden onset of sob.   ems brought her in in severe resp distress.  + labored resp with ams.  Level 5 caveat for urgent need for intervention. cxr bilat pulm edema.   Became worse.  Became somnolent.  Intubation required. ntg for htn.   Admitted to icu.  CRITICAL CARE Performed by: Nicholes Stairs   Total critical care time: 30 min  Critical care time was exclusive of separately billable procedures and treating other patients.  Critical care was necessary to treat or prevent imminent or life-threatening deterioration.  Critical care was time spent personally by me on the following activities: development of treatment plan with patient and/or surrogate as well as nursing, discussions with consultants, evaluation of patient's response to treatment, examination of patient, obtaining history from patient or surrogate, ordering and performing treatments and interventions, ordering and review of laboratory studies, ordering and review of radiographic studies, pulse oximetry and re-evaluation of patient's condition.  Cheri Guppy, MD 01/29/12 2325

## 2012-01-29 NOTE — ED Provider Notes (Signed)
History     CSN: [id number]  Arrival date & time 01/29/12  2158   First MD Initiated Contact with Patient 01/29/12 2205      Chief Complaint  Patient presents with  . Shortness of Breath    (Consider location/radiation/quality/duration/timing/severity/associated sxs/prior treatment) Patient is a 71 y.o. female presenting with shortness of breath. The history is provided by a relative.  Shortness of Breath  Episode onset: 2-3 weeks ago w/ acute worsening this evening. The onset was sudden. The problem occurs continuously. The problem has been rapidly worsening. The problem is severe. Nothing relieves the symptoms. Nothing aggravates the symptoms. Associated symptoms include shortness of breath. Pertinent negatives include no chest pain, no fever and no cough. She was not exposed to toxic fumes. She has had prior hospitalizations. She has had prior ICU admissions. She has had prior intubations. Past medical history comments: CHF, pt is a smoker.    Past Medical History  Diagnosis Date  . CAD (coronary artery disease)     cath 11/11: LAD 40%, mid-dist 25-30%; prox CFX 30%, mid 40%; OM 50%; PDA 50%; PLV 50%;  EF 65%  . Acute myocardial infarction     in setting of AFib with RVR 03/2010  . Atrial fibrillation     DCCV 11/17/11; coumadin & amiodarone  . HOCM (hypertrophic obstructive cardiomyopathy)     Echo 11/2011:severe LVH, EF 65-70%, mid-cavity gradient up to  . Hypertension   . Tobacco abuse   . Chronic kidney disease     Stage 4  . CHF (congestive heart failure)     preserved LVF  . Iron deficiency anemia   . Third degree uterine prolapse   . Hx MRSA infection     Past Surgical History  Procedure Date  . None   . Cardioversion 11/17/2011    Procedure: CARDIOVERSION;  Surgeon: Hillis Range, MD;  Location: [facility] OR;  Service: Cardiovascular;  Laterality: N/A;    Family History  Problem Relation Age of Onset  . Coronary artery disease Neg Hx   . Atrial fibrillation  Neg Hx     History  Substance Use Topics  . Smoking status: Current Every Day Smoker -- 0.2 packs/day for 30 years    Types: Cigarettes  . Smokeless tobacco: Never Used  . Alcohol Use: No    OB History    Grav Para Term Preterm Abortions TAB SAB Ect Mult Living                  Review of Systems  Constitutional: Negative for fever and fatigue.  HENT: Negative for congestion, drooling and neck pain.   Eyes: Negative for pain.  Respiratory: Positive for shortness of breath. Negative for cough.   Cardiovascular: Negative for chest pain.  Gastrointestinal: Negative for nausea, vomiting, abdominal pain and diarrhea.  Genitourinary: Negative for dysuria and hematuria.  Musculoskeletal: Negative for back pain and gait problem.  Skin: Negative for color change.  Neurological: Negative for dizziness and headaches.  Hematological: Negative for adenopathy.  Psychiatric/Behavioral: Negative for behavioral problems.  All other systems reviewed and are negative.    Allergies  Review of patient's allergies indicates no known allergies.  Home Medications   Current Outpatient Rx  Name Route Sig Dispense Refill  . AMIODARONE HCL 200 MG PO TABS Oral Take 1 tablet (200 mg total) by mouth daily. Take 200mg  daily starting 12/16/11 30 tablet 6  . FERROUS SULFATE 325 (65 FE) MG PO TABS Oral Take 325 mg by  mouth 2 (two) times daily.    . FUROSEMIDE 20 MG PO TABS Oral Take 20 mg by mouth daily.     Marland Kitchen METOPROLOL TARTRATE 25 MG PO TABS Oral Take 1 tablet (25 mg total) by mouth 2 (two) times daily. 60 tablet 6  . PANTOPRAZOLE SODIUM 40 MG PO TBEC Oral Take 40 mg by mouth daily.    Marland Kitchen ROSUVASTATIN CALCIUM 10 MG PO TABS Oral Take 10 mg by mouth daily.    . WARFARIN SODIUM 5 MG PO TABS Oral Take 1 tablet (5 mg total) by mouth daily. Take 5mg  daily and have your INR checked on Monday 7/15 for further instructions on dosing 30 tablet 2  . NITROGLYCERIN 0.4 MG SL SUBL Sublingual Place 0.4 mg under the  tongue every 5 (five) minutes as needed. For chest pain      BP 102/66  Pulse 45  Resp 27  Wt 190 lb 7.6 oz (86.4 kg)  SpO2 81%  Physical Exam  Nursing note and vitals reviewed. Constitutional: She appears well-developed and well-nourished. She appears distressed.  HENT:  Head: Normocephalic.  Mouth/Throat: No oropharyngeal exudate.  Eyes: Conjunctivae normal and EOM are normal. Pupils are equal, round, and reactive to light.  Neck: Normal range of motion. Neck supple.  Cardiovascular: Normal heart sounds and intact distal pulses.  Exam reveals no gallop and no friction rub.   No murmur heard.      Sinus tachycardia  Pulmonary/Chest: She is in respiratory distress. She has no wheezes.       Greatly decreased BS bilaterally. Mildly rhonchorus bilaterally.   Abdominal: Soft. Bowel sounds are normal. There is no tenderness. There is no rebound and no guarding.  Musculoskeletal: Normal range of motion. She exhibits edema (very mild edema in bilateral LE's). She exhibits no tenderness.  Neurological: She is alert.       Pt able to answer very basic questions.   Skin: Skin is warm and dry.  Psychiatric: She has a normal mood and affect. Her behavior is normal.    ED Course  INTUBATION Date/Time: 01/29/2012 11:36 PM Performed by: Purvis Sheffield Authorized by: Cheri Guppy Consent: Verbal consent not obtained. Written consent not obtained. The procedure was performed in an emergent situation. Consent given by: family member. Patient identity confirmed: arm band and hospital-assigned identification number Time out: Immediately prior to procedure a "time out" was called to verify the correct patient, procedure, equipment, support staff and site/side marked as required. Indications: respiratory distress and respiratory failure Intubation method: video-assisted Patient status: sedated Preoxygenation: BVM Sedatives: etomidate Paralytic: succinylcholine Laryngoscope size: Mac  3 Tube size: 7.5 mm Tube type: cuffed Number of attempts: 1 Ventilation between attempts: BVM Cricoid pressure: no Cords visualized: yes Post-procedure assessment: chest rise,  ETCO2 monitor and CO2 detector Breath sounds: equal Cuff inflated: yes ETT to lip: 23 cm Tube secured with: ETT holder Chest x-ray interpreted by me. Chest x-ray findings: endotracheal tube in appropriate position Patient tolerance: Patient tolerated the procedure well with no immediate complications.   (including critical care time)  Labs Reviewed  CBC WITH DIFFERENTIAL - Abnormal; Notable for the following:    WBC 15.5 (*)     RDW 17.3 (*)     Neutro Abs 8.3 (*)     Lymphs Abs 5.8 (*)     Monocytes Absolute 1.1 (*)     All other components within normal limits  COMPREHENSIVE METABOLIC PANEL - Abnormal; Notable for the following:    Sodium 134 (*)  CO2 17 (*)     Glucose, Bld 321 (*)     Creatinine, Ser 2.06 (*)     Calcium 11.8 (*)     Alkaline Phosphatase 174 (*)     Total Bilirubin 0.2 (*)     GFR calc non Af Amer 23 (*)     GFR calc Af Amer 27 (*)     All other components within normal limits  LACTIC ACID, PLASMA - Abnormal; Notable for the following:    Lactic Acid, Venous 5.3 (*)     All other components within normal limits  PROTIME-INR - Abnormal; Notable for the following:    Prothrombin Time 30.5 (*)     INR 3.13 (*)     All other components within normal limits  PRO B NATRIURETIC PEPTIDE - Abnormal; Notable for the following:    Pro B Natriuretic peptide (BNP) 1089.0 (*)     All other components within normal limits  URINALYSIS, MICROSCOPIC ONLY - Abnormal; Notable for the following:    APPearance CLOUDY (*)     Glucose, UA 100 (*)     Hgb urine dipstick SMALL (*)     Protein, ur >300 (*)     Leukocytes, UA SMALL (*)     All other components within normal limits  TROPONIN I  CULTURE, BLOOD (ROUTINE X 2)  CULTURE, BLOOD (ROUTINE X 2)  BLOOD GAS, ARTERIAL  URINE CULTURE    Dg Chest Portable 1 View  01/29/2012  *RADIOLOGY REPORT*  Clinical Data: Shortness of breath  PORTABLE CHEST - 1 VIEW  Comparison: 01/29/2012  Findings: The endotracheal tube tip is above the carina.  There is mild moderate cardiac enlargement.  Interstitial edema pattern has increased from previous exam  IMPRESSION:  1.  ET tube tip is above the carina. 2.  Increase in interstitial edema.   Original Report Authenticated By: Rosealee Albee, M.D.    Dg Chest Portable 1 View  01/29/2012  *RADIOLOGY REPORT*  Clinical Data: Shortness of breath  PORTABLE CHEST - 1 VIEW  Comparison: 11/15/2011  Findings: Moderate cardiac enlargement.  There is moderate diffuse interstitial edema.  Small amount of pleural fluid is identified along the major fissure of the right lung.  Review of the visualized osseous structures is unremarkable.  IMPRESSION:  1.  Cardiac enlargement and moderate pulmonary edema.   Original Report Authenticated By: Rosealee Albee, M.D.      1. Flash pulmonary edema   2. Respiratory failure   3. Lactic acidosis   4. Renal insufficiency   5. Hyperglycemia      Date: 01/29/2012  Rate: 130  Rhythm: sinus tachycardia  QRS Axis: normal  Intervals: QT prolonged  ST/T Wave abnormalities: nonspecific ST/T changes  Conduction Disutrbances: LVH w/ repolarization abnormality  Narrative Interpretation: No new ST or T wave changes cw ischemia  Old EKG Reviewed: changes noted    MDM  12:15 AM 71 y.o. female w hx of CAD, MI, A fib on coumadin, CHF who pw sob. Family notes pt has been mildly sob for 2-3 weeks. Daughter spoke w/ pt at 7:30 pm this evening and pt was doing well at that time. The pt had worsening sob between 7:30pm and presentation. Pt presents tachypneic w/ greatly dec BS bilaterally, hypertensive, and in acute distress. Pt is alert and answering basic questions. She was placed on bipap temporarily and cxr showed pulmonary edema. Solumedrol and nebs ordered. Pt remained  tachypneic and decision was made to intubate the  patient. Will place on propofol for sedation and start nitro gtt for BP. Lasix 40 mg IV ordered.   Consulted critical care for admission.   Clinical Impression 1. Flash pulmonary edema   2. Respiratory failure   3. Lactic acidosis   4. Renal insufficiency   5. Hyperglycemia            Purvis Sheffield, MD 01/30/12 [phone]

## 2012-01-29 NOTE — ED Notes (Signed)
EDP at bedside for intubation  

## 2012-01-29 NOTE — ED Notes (Signed)
Dr. Garret Reddish at bedside on patient arrival. Pt in acute respiratory distress. Verbal instruction given for CXR. RT at bedside

## 2012-01-29 NOTE — ED Notes (Signed)
EDP at bedside on arrival.  

## 2012-01-30 ENCOUNTER — Inpatient Hospital Stay (HOSPITAL_COMMUNITY): Payer: Medicare Other

## 2012-01-30 DIAGNOSIS — I4891 Unspecified atrial fibrillation: Secondary | ICD-10-CM

## 2012-01-30 DIAGNOSIS — E872 Acidosis: Secondary | ICD-10-CM

## 2012-01-30 DIAGNOSIS — I5032 Chronic diastolic (congestive) heart failure: Secondary | ICD-10-CM

## 2012-01-30 DIAGNOSIS — R739 Hyperglycemia, unspecified: Secondary | ICD-10-CM | POA: Diagnosis present

## 2012-01-30 DIAGNOSIS — J810 Acute pulmonary edema: Secondary | ICD-10-CM

## 2012-01-30 LAB — BLOOD GAS, ARTERIAL
Acid-base deficit: 6.4 mmol/L — ABNORMAL HIGH (ref 0.0–2.0)
Bicarbonate: 19.2 meq/L — ABNORMAL LOW (ref 20.0–24.0)
Drawn by: 252031
FIO2: 1 %
MECHVT: 400 mL
O2 Saturation: 98 %
PEEP: 5 cmH2O
Patient temperature: 98.6
RATE: 16 {breaths}/min
TCO2: 20.5 mmol/L (ref 0–100)
pCO2 arterial: 42.7 mmHg (ref 35.0–45.0)
pH, Arterial: 7.275 — ABNORMAL LOW (ref 7.350–7.450)
pO2, Arterial: 124 mmHg — ABNORMAL HIGH (ref 80.0–100.0)

## 2012-01-30 LAB — PHOSPHORUS: Phosphorus: 2.4 mg/dL (ref 2.3–4.6)

## 2012-01-30 LAB — GLUCOSE, CAPILLARY
Glucose-Capillary: 244 mg/dL — ABNORMAL HIGH (ref 70–99)
Glucose-Capillary: 194 mg/dL — ABNORMAL HIGH (ref 70–99)
Glucose-Capillary: 190 mg/dL — ABNORMAL HIGH (ref 70–99)

## 2012-01-30 LAB — POCT I-STAT 3, ART BLOOD GAS (G3+)
Bicarbonate: 15.7 mEq/L — ABNORMAL LOW (ref 20.0–24.0)
Bicarbonate: 20.5 mEq/L (ref 20.0–24.0)
TCO2: 16 mmol/L (ref 0–100)
pCO2 arterial: 34 mmHg — ABNORMAL LOW (ref 35.0–45.0)
pH, Arterial: 7.355 (ref 7.350–7.450)
pH, Arterial: 7.391 (ref 7.350–7.450)
pO2, Arterial: 122 mmHg — ABNORMAL HIGH (ref 80.0–100.0)
pO2, Arterial: 69 mmHg — ABNORMAL LOW (ref 80.0–100.0)

## 2012-01-30 LAB — CBC
Platelets: 231 10*3/uL (ref 150–400)
RBC: 4.45 MIL/uL (ref 3.87–5.11)
WBC: 17.1 10*3/uL — ABNORMAL HIGH (ref 4.0–10.5)

## 2012-01-30 LAB — BASIC METABOLIC PANEL
CO2: 20 mEq/L (ref 19–32)
Chloride: 101 mEq/L (ref 96–112)
GFR calc Af Amer: 23 mL/min — ABNORMAL LOW (ref >90)
Potassium: 4.2 mEq/L (ref 3.5–5.1)
Sodium: 136 mEq/L (ref 135–145)

## 2012-01-30 LAB — MRSA PCR SCREENING: MRSA by PCR: NEGATIVE

## 2012-01-30 MED ORDER — BIOTENE DRY MOUTH MT LIQD
15.0000 mL | Freq: Four times a day (QID) | OROMUCOSAL | Status: DC
  Administered 2012-01-30 (×3): 15 mL via OROMUCOSAL

## 2012-01-30 MED ORDER — VANCOMYCIN HCL IN DEXTROSE 1-5 GM/200ML-% IV SOLN
1000.0000 mg | INTRAVENOUS | Status: DC
  Administered 2012-01-31: 1000 mg via INTRAVENOUS
  Filled 2012-01-30: qty 200

## 2012-01-30 MED ORDER — MIDAZOLAM HCL 2 MG/2ML IJ SOLN
2.0000 mg | Freq: Once | INTRAMUSCULAR | Status: AC
  Administered 2012-01-30: 2 mg via INTRAVENOUS

## 2012-01-30 MED ORDER — POTASSIUM CHLORIDE CRYS ER 20 MEQ PO TBCR
20.0000 meq | EXTENDED_RELEASE_TABLET | Freq: Two times a day (BID) | ORAL | Status: DC
  Administered 2012-01-30 – 2012-02-01 (×5): 20 meq via ORAL
  Filled 2012-01-30 (×7): qty 1

## 2012-01-30 MED ORDER — CHLORHEXIDINE GLUCONATE 0.12 % MT SOLN
15.0000 mL | Freq: Two times a day (BID) | OROMUCOSAL | Status: DC
  Administered 2012-01-30 (×2): 15 mL via OROMUCOSAL
  Filled 2012-01-30 (×2): qty 15

## 2012-01-30 MED ORDER — METOPROLOL TARTRATE 25 MG PO TABS
25.0000 mg | ORAL_TABLET | Freq: Two times a day (BID) | ORAL | Status: DC
  Administered 2012-01-31 – 2012-02-01 (×3): 25 mg via ORAL
  Filled 2012-01-30 (×4): qty 1

## 2012-01-30 MED ORDER — PANTOPRAZOLE SODIUM 40 MG IV SOLR
40.0000 mg | Freq: Every day | INTRAVENOUS | Status: DC
  Administered 2012-01-30 (×2): 40 mg via INTRAVENOUS
  Filled 2012-01-30 (×3): qty 40

## 2012-01-30 MED ORDER — BIOTENE DRY MOUTH MT LIQD: 15.0000 mL | Freq: Two times a day (BID) | OROMUCOSAL | Status: DC

## 2012-01-30 MED ORDER — SODIUM CHLORIDE 0.9 % IV SOLN: 250.0000 mL | INTRAVENOUS | Status: DC | PRN

## 2012-01-30 MED ORDER — FENTANYL CITRATE 0.05 MG/ML IJ SOLN: 25.0000 ug | INTRAMUSCULAR | Status: DC | PRN

## 2012-01-30 MED ORDER — ALBUTEROL SULFATE (5 MG/ML) 0.5% IN NEBU: 2.5000 mg | INHALATION_SOLUTION | RESPIRATORY_TRACT | Status: DC | PRN

## 2012-01-30 MED ORDER — FUROSEMIDE 20 MG PO TABS
20.0000 mg | ORAL_TABLET | Freq: Two times a day (BID) | ORAL | Status: DC
  Administered 2012-01-30 – 2012-02-01 (×4): 20 mg via ORAL
  Filled 2012-01-30 (×6): qty 1

## 2012-01-30 MED ORDER — MIDAZOLAM HCL 2 MG/2ML IJ SOLN: 1.0000 mg | INTRAMUSCULAR | Status: DC | PRN

## 2012-01-30 MED ORDER — MIDAZOLAM HCL 2 MG/2ML IJ SOLN
INTRAMUSCULAR | Status: AC
  Filled 2012-01-30: qty 2

## 2012-01-30 MED ORDER — FUROSEMIDE 10 MG/ML IJ SOLN
40.0000 mg | Freq: Once | INTRAMUSCULAR | Status: AC
  Administered 2012-01-30: 40 mg via INTRAVENOUS
  Filled 2012-01-30: qty 4

## 2012-01-30 MED ORDER — IPRATROPIUM BROMIDE 0.02 % IN SOLN: 0.5000 mg | RESPIRATORY_TRACT | Status: DC | PRN

## 2012-01-30 MED ORDER — VANCOMYCIN HCL 1000 MG IV SOLR
1500.0000 mg | Freq: Once | INTRAVENOUS | Status: AC
  Administered 2012-01-30: 1500 mg via INTRAVENOUS
  Filled 2012-01-30: qty 1500

## 2012-01-30 MED ORDER — AMIODARONE HCL 200 MG PO TABS
200.0000 mg | ORAL_TABLET | Freq: Every day | ORAL | Status: DC
  Administered 2012-01-30 – 2012-02-02 (×4): 200 mg via ORAL
  Filled 2012-01-30 (×4): qty 1

## 2012-01-30 MED ORDER — PIPERACILLIN-TAZOBACTAM 3.375 G IVPB
3.3750 g | Freq: Three times a day (TID) | INTRAVENOUS | Status: DC
  Administered 2012-01-30 – 2012-01-31 (×5): 3.375 g via INTRAVENOUS
  Filled 2012-01-30 (×6): qty 50

## 2012-01-30 NOTE — Progress Notes (Signed)
ANTIBIOTIC CONSULT NOTE - INITIAL  Pharmacy Consult for Vancomycin and Zosyn  Indication: PNA  No Known Allergies  Patient Measurements: Height: 5' 1.81" (157 cm) Weight: 190 lb 7.6 oz (86.4 kg) IBW/kg (Calculated) : 49.67   Vital Signs: BP: 107/70 mmHg (09/22 0032) Pulse Rate: 94  (09/22 0032)  Labs:  Alvira Philips 01/29/12 2237  WBC 15.5*  HGB 12.6  PLT 299  LABCREA --  CREATININE 2.06*   Estimated Creatinine Clearance: 25.5 ml/min (by C-G formula based on Cr of 2.06). No results found for this basename: VANCOTROUGH:2,VANCOPEAK:2,VANCORANDOM:2,GENTTROUGH:2,GENTPEAK:2,GENTRANDOM:2,TOBRATROUGH:2,TOBRAPEAK:2,TOBRARND:2,AMIKACINPEAK:2,AMIKACINTROU:2,AMIKACIN:2, in the last 72 hours   Microbiology: No results found for this or any previous visit (from the past 720 hour(s)).  Medical History: Past Medical History  Diagnosis Date  . CAD (coronary artery disease)     cath 11/11: LAD 40%, mid-dist 25-30%; prox CFX 30%, mid 40%; OM 50%; PDA 50%; PLV 50%;  EF 65%  . Acute myocardial infarction     in setting of AFib with RVR 03/2010  . Atrial fibrillation     DCCV 11/17/11; coumadin & amiodarone  . HOCM (hypertrophic obstructive cardiomyopathy)     Echo 11/2011:severe LVH, EF 65-70%, mid-cavity gradient up to  . Hypertension   . Tobacco abuse   . Chronic kidney disease     Stage 4  . CHF (congestive heart failure)     preserved LVF  . Iron deficiency anemia   . Third degree uterine prolapse   . Hx MRSA infection     Medications:  Amiodarone  Iron  Lasix  Lopressor  Ntg  Protonix Crestor  Coumadin 7.5 mg daily  Assessment: 71 yo female with SOB, PNA for empiric antibiotics  Goal of Therapy:  Vancomycin trough level 15-20 mcg/ml  Plan:  Vancomycin 1500 mg IV now, then 1 g IV q48h Zosyn 3.375 g IV q8h  Saliou Barnier, Gary Fleet 01/30/2012,1:15 AM

## 2012-01-30 NOTE — Procedures (Signed)
Extubation Procedure Note  Patient Details:   Name: [name] DOB: 09/29/1940 MRN: [id number]   Airway Documentation:  AIRWAYS 7.5 mm (Active)    Evaluation  O2 sats: stable throughout Complications: No apparent complications Patient did tolerate procedure well. Bilateral Breath Sounds: Rhonchi;Diminished   Yes Pt extubated per MD order.  Pt tolerated well, Sats 98% on 40% AFM.  RT will continue to monitor. Closson, Terie Purser 01/30/2012, 11:34 AM

## 2012-01-30 NOTE — H&P (Addendum)
Name: [name] MRN: [id number] DOB: 06-27-1940    LOS: 1  PULMONARY / CRITICAL CARE MEDICINE  HPI:   71 years old female with PMH relevant for CAD, AMI, A. Fib with RVR, severe HOCM, HTN, CKD, CHF with prior intubations and MRSA infection. Presents with sudden onset of sever SOB. As per th At admission to the ED found to be severely hypertensive. She was intubated for hypoxemic respiratory failure. Chest X ray showed bilateral infiltrates compatible with acute pulmonary edema. The patient was started on NTG drip with good response.  At the time of my exam the patient is intubated, synchronous with the ventilator, calm, awake and following commands. Hemodynamically stable, saturating 100% on 100% FiO2.  Past Medical History  Diagnosis Date  . CAD (coronary artery disease)     cath 11/11: LAD 40%, mid-dist 25-30%; prox CFX 30%, mid 40%; OM 50%; PDA 50%; PLV 50%;  EF 65%  . Acute myocardial infarction     in setting of AFib with RVR 03/2010  . Atrial fibrillation     DCCV 11/17/11; coumadin & amiodarone  . HOCM (hypertrophic obstructive cardiomyopathy)     Echo 11/2011:severe LVH, EF 65-70%, mid-cavity gradient up to  . Hypertension   . Tobacco abuse   . Chronic kidney disease     Stage 4  . CHF (congestive heart failure)     preserved LVF  . Iron deficiency anemia   . Third degree uterine prolapse   . Hx MRSA infection    Past Surgical History  Procedure Date  . None   . Cardioversion 11/17/2011    Procedure: CARDIOVERSION;  Surgeon: Hillis Range, MD;  Location: [facility] OR;  Service: Cardiovascular;  Laterality: N/A;   Prior to Admission medications   Medication Sig Start Date End Date Taking? Authorizing Provider  amiodarone (PACERONE) 200 MG tablet Take 1 tablet (200 mg total) by mouth daily. Take 200mg  daily starting 12/16/11 12/16/11 12/15/12 Yes Jessica A Hope, PA-C  ferrous sulfate 325 (65 FE) MG tablet Take 325 mg by mouth 2 (two) times daily.   Yes Historical Provider, MD    furosemide (LASIX) 20 MG tablet Take 20 mg by mouth daily.  08/18/11  Yes Beatrice Lecher, PA  metoprolol tartrate (LOPRESSOR) 25 MG tablet Take 1 tablet (25 mg total) by mouth 2 (two) times daily. 11/18/11 11/17/12 Yes Jessica A Hope, PA-C  pantoprazole (PROTONIX) 40 MG tablet Take 40 mg by mouth daily. 02/04/11  Yes Rollene Rotunda, MD  rosuvastatin (CRESTOR) 10 MG tablet Take 10 mg by mouth daily.   Yes Historical Provider, MD  warfarin (COUMADIN) 5 MG tablet Take 1 tablet (5 mg total) by mouth daily. Take 5mg  daily and have your INR checked on Monday 7/15 for further instructions on dosing 11/18/11  Yes Jessica A Hope, PA-C  nitroGLYCERIN (NITROSTAT) 0.4 MG SL tablet Place 0.4 mg under the tongue every 5 (five) minutes as needed. For chest pain    Historical Provider, MD   Allergies No Known Allergies  Family History Family History  Problem Relation Age of Onset  . Coronary artery disease Neg Hx   . Atrial fibrillation Neg Hx    Social History  reports that she has been smoking Cigarettes.  She has a 7.5 pack-year smoking history. She has never used smokeless tobacco. She reports that she does not drink alcohol or use illicit drugs.  Review Of Systems:  Unable to provide.   Current Status:  Vital Signs: Temp:  [  99.9 F (37.7 C)] 99.9 F (37.7 C) (09/22 0115) Pulse Rate:  [93-140] 93  (09/22 0115) Resp:  [20-45] 23  (09/22 0115) BP: (85-211)/(39-139) 110/77 mmHg (09/22 0115) SpO2:  [81 %-100 %] 100 % (09/22 0115) FiO2 (%):  [100 %] 100 % (09/21 2231) Weight:  [190 lb 7.6 oz (86.4 kg)] 190 lb 7.6 oz (86.4 kg) (09/21 2216)  Physical Examination: General:  Intubated, mechanically ventilated, no acute distress Neuro:  Awake, following commands, synchronous, nonfocal HEENT:  PERRL, pink conjunctivae, moist membranes Neck:  Supple, positive JVD   Cardiovascular:  RRR, no M/R/G Lungs:  Bilateral diffuse crackles. Abdomen:  Soft, nontender, nondistended, bowel sounds  present Musculoskeletal:  Moves all extremities, no pedal edema Skin:  No rash  Active Problems:  Renal insufficiency  Lactic acidosis  Respiratory failure  Flash pulmonary edema  Hyperglycemia   ASSESSMENT AND PLAN  PULMONARY No results found for this basename: PHART:5,PCO2:5,PCO2ART:5,PO2ART:5,HCO3:5,O2SAT:5 in the last 168 hours Ventilator Settings: Vent Mode:  [-] PRVC FiO2 (%):  [100 %] 100 % Set Rate:  [16 bmp] 16 bmp Vt Set:  [550 mL] 550 mL PEEP:  [5 cmH20] 5 cmH20 CXR:  Bilateral pulmonary infiltrates consistent with pulmonary edema. ETT:  Adequate position  A:   1) Acute hypoxemic respiratory failure 2) Acute pulmonary edema in the setting of hypertensive emergency. P:   - Mechanical ventilation   - PRVC, Vt 8cc/kg, PEEP: 5, RR: 16, FiO2 to keep O2 sat >92% - Cannot rule out pneumonia, will start Zosyn and vancomycin and follow cultures and resolution of infiltrates with diuresis.  CARDIOVASCULAR  Lab 01/29/12 2239 01/29/12 2238  TROPONINI <0.30 --  LATICACIDVEN -- 5.3*  PROBNP 1089.0* --   ECG:  Intraventricular conduction delay likely related to rate. Lines: Peripheral lines.  A:  1) Hypertensive emergency 2) Lactic acidosis, likely secondary to organ hypoperfusion. P:  - NTG drip - Will resume home antihypertensive medications in am.   RENAL  Lab 01/29/12 2237  NA 134*  K 3.7  CL 99  CO2 17*  BUN 18  CREATININE 2.06*  CALCIUM 11.8*  MG --  PHOS --   Intake/Output    None    Foley:  Placed today  A:   1) Acute on chronic renal failure in the setting of hypertensive emergency. P:   - Monitor CMP  GASTROINTESTINAL  Lab 01/29/12 2237  AST 28  ALT 17  ALKPHOS 174*  BILITOT 0.2*  PROT 8.3  ALBUMIN 3.8    A:   1)  No issues P:   - GI prophylaxis with protonix - NPO  HEMATOLOGIC  Lab 01/29/12 2237  HGB 12.6  HCT 39.9  PLT 299  INR 3.13*  APTT --   A:   1) Supratherapeutic INR P:  - Will hold coumadin and  follow INR in am  INFECTIOUS  Lab 01/29/12 2237  WBC 15.5*  PROCALCITON --   Cultures: Blood and urine cultures sent. Antibiotics: Zosyn and vancomycin.  A:   1) Bilateral infiltrates likely secondary to pulmonary edema. Cannot rule out pneumonia. Elevated WBC. P:   - Will start Zosyn and vancomycin - Will get respiratory cultures - Repeat chest x ray in am looking for resolution of infiltrates with diuresis  ENDOCRINE No results found for this basename: GLUCAP:5 in the last 168 hours A:     1) No history of diabetes   NEUROLOGIC  A:   1)  Neurologically intact P:   - Intermittent sedation with  versed and fentanyl.  BEST PRACTICE / DISPOSITION - Level of Care:  ICU - Primary Service:  PCCM - Consultants:  None - Code Status:  Full code - Diet:  NPO - DVT Px:  Supratherapeutic on coumadin. SCD's - GI Px:  Protonix - Skin Integrity:  Intact  The patient is critically ill with multiple organ systems failure and requires high complexity decision making for assessment and support, frequent evaluation and titration of therapies, application of advanced monitoring technologies and extensive interpretation of multiple databases.   Critical Care Time devoted to patient care services described in this note is: 1 Hour  Overton Mam, M.D. Pulmonary and Critical Care Medicine [facility] Pager: [phone]  01/30/2012, 2:07 AM

## 2012-01-30 NOTE — ED Notes (Signed)
CCM MD at bedside.  

## 2012-01-30 NOTE — ED Notes (Signed)
Please contact patient family [name] ([phone]) or Gabriel Rung [phone])

## 2012-01-30 NOTE — ED Provider Notes (Signed)
I saw and evaluated the patient, reviewed the resident's note and I agree with the findings and plan.  Bobie Kistler, MD 01/30/12 1503 

## 2012-01-30 NOTE — H&P (Signed)
Name: [name] MRN: [id number] DOB: 08-10-1940    LOS: 1  PULMONARY / CRITICAL CARE MEDICINE  HPI:   71 years old female with PMH relevant for CAD, AMI, A. Fib with RVR, severe HOCM, HTN, CKD, CHF with prior intubations and MRSA infection. Presents with sudden onset of sever SOB. As per th At admission to the ED found to be severely hypertensive. She was intubated for hypoxemic respiratory failure. Chest X ray showed bilateral infiltrates compatible with acute pulmonary edema. The patient was started on NTG drip with good response.  At the time of my exam the patient is intubated, synchronous with the ventilator, calm, awake and following commands. Hemodynamically stable, saturating 100% on 100% FiO2.    Current Status: Stable  Vital Signs: Temp:  [99.9 F (37.7 C)-100.1 F (37.8 C)] 100.1 F (37.8 C) (09/22 1000) Pulse Rate:  [78-140] 80  (09/22 1000) Resp:  [20-45] 31  (09/22 1000) BP: (85-211)/(39-139) 116/69 mmHg (09/22 1000) SpO2:  [81 %-100 %] 98 % (09/22 1000) FiO2 (%):  [40 %-100 %] 40 % (09/22 1000) Weight:  [86.4 kg (190 lb 7.6 oz)] 86.4 kg (190 lb 7.6 oz) (09/21 2216)  Intake/Output Summary (Last 24 hours) at 01/30/12 1228 Last data filed at 01/30/12 1000  Gross per 24 hour  Intake  612.5 ml  Output    790 ml  Net -177.5 ml    Physical Examination: General:  Intubated, mechanically ventilated, no acute distress Neuro:  Awake, following commands, synchronous, nonfocal HEENT:  PERRL, pink conjunctivae, moist membranes Neck:  Supple, positive JVD   Cardiovascular:  RRR, no M/R/G Lungs:  Bilateral diffuse crackles. Abdomen:  Soft, nontender, nondistended, bowel sounds present Musculoskeletal:  Moves all extremities, no pedal edema Skin:  No rash  Active Problems:  Renal insufficiency  Lactic acidosis  Respiratory failure  Flash pulmonary edema  Hyperglycemia   ASSESSMENT AND PLAN  PULMONARY  Lab 01/30/12 0421 01/30/12 0230  PHART 7.355 7.275*    PCO2ART 28.1* 42.7  PO2ART 122.0* 124.0*  HCO3 15.7* 19.2*  O2SAT 99.0 98.0   Ventilator Settings: Vent Mode:  [-] CPAP;PSV FiO2 (%):  [40 %-100 %] 40 % Set Rate:  [16 bmp] 16 bmp Vt Set:  [400 mL-550 mL] 400 mL PEEP:  [5 cmH20] 5 cmH20 Pressure Support:  [5 cmH20] 5 cmH20 Plateau Pressure:  [14 cmH20-17 cmH20] 14 cmH20 CXR:   Dg Chest Portable 1 View   ETT:  9/21>>9/22  A:   1) Acute hypoxemic respiratory failure 2) Acute pulmonary edema in the setting of hypertensive emergency. P:   - Mechanical ventilation   - PRVC, Vt 8cc/kg, PEEP: 5, RR: 16, FiO2 to keep O2 sat >92% - Cannot rule out pneumonia, will start Zosyn and vancomycin and follow cultures and resolution of infiltrates with diuresis. -check  c x r extubate 9/22  CARDIOVASCULAR  Lab 01/29/12 2239 01/29/12 2238  TROPONINI <0.30 --  LATICACIDVEN -- 5.3*  PROBNP 1089.0* --   ECG:  Intraventricular conduction delay likely related to rate. Lines: Peripheral lines.  A:  1) Hypertensive emergency 2) Lactic acidosis, likely secondary to organ hypoperfusion. P:  - Will resume home antihypertensive medications + amiodarone   RENAL  Lab 01/30/12 0844 01/29/12 2237  NA 136 134*  K 4.2 3.7  CL 101 99  CO2 20 17*  BUN 27* 18  CREATININE 2.31* 2.06*  CALCIUM 11.4* 11.8*  MG 1.8 --  PHOS 2.4 --   Intake/Output  09/21 0701 - 09/22 0700 09/22 0701 - 09/23 0700   I.V. (mL/kg)  20 (0.2)   NG/GT  30   IV Piggyback 537.5 25   Total Intake(mL/kg) 537.5 (6.2) 75 (0.9)   Urine (mL/kg/hr) 330 (0.2) 160   Emesis/NG output 50 250   Total Output 380 410   Net +157.5 -335         Foley:  9/21  A:   1) Acute on chronic renal failure in the setting of hypertensive emergency. Lab Results  Component Value Date   CREATININE 2.31* 01/30/2012   CREATININE 2.06* 01/29/2012   CREATININE 1.61* 11/18/2011    P:   - Monitor CMP  GASTROINTESTINAL  Lab 01/29/12 2237  AST 28  ALT 17  ALKPHOS 174*  BILITOT  0.2*  PROT 8.3  ALBUMIN 3.8    A:   1)  No issues P:   - GI prophylaxis with protonix - NPO  HEMATOLOGIC  Lab 01/30/12 0844 01/29/12 2237  HGB 11.8* 12.6  HCT 36.3 39.9  PLT 231 299  INR 3.16* 3.13*  APTT -- --   A:   1) Supratherapeutic INR Lab Results  Component Value Date   INR 3.16* 01/30/2012   INR 3.13* 01/29/2012   INR 2.2 01/07/2012    P:  - Will hold coumadin and follow INR   INFECTIOUS  Lab 01/30/12 0844 01/29/12 2237  WBC 17.1* 15.5*  PROCALCITON -- --   Cultures: 9/22 bc>> 9/22 sputum>> Antibiotics: Zosyn 9/21>> vancomycin 9/21 >>  A:   1) Bilateral infiltrates likely secondary to pulmonary edema. Cannot rule out pneumonia. Elevated WBC. P:   See flows  ENDOCRINE  Lab 01/30/12 0756 01/30/12 0227  GLUCAP 194* 244*   A:     1) No history of diabetes   NEUROLOGIC  A:   1)  Neurologically intact P:   - Intermittent sedation with versed and fentanyl.  BEST PRACTICE / DISPOSITION - Level of Care:  ICU - Primary Service:  PCCM - Consultants:  None - Code Status:  Full code - Diet:  NPO - DVT Px:  Supratherapeutic on coumadin. SCD's - GI Px:  Protonix - Skin Integrity:  Intact  Brett Canales Minor ACNP [facility] PCCM Pager [phone] till 3 pm If no answer page [phone] 01/30/2012, 10:32 AM   Attending Addendum:  I have seen the patient, discussed the issues, test results and plans with S. Minor, NP. I agree with the Assessment and Plans as ammended above.   Levy Pupa, MD, PhD 01/30/2012, 12:29 PM [place] [facility] [phone] or if no answer [phone]

## 2012-01-30 NOTE — Progress Notes (Signed)
01/30/2012 @ 1040 Pt placed on Tbar trial per Dr. Delton Coombes.  Pt is on 40% and tolerating well. HR 80, RR 22, sats 98%.  No increased WOB or SOB noted.  RT will continue to monitor.

## 2012-01-31 ENCOUNTER — Inpatient Hospital Stay (HOSPITAL_COMMUNITY): Payer: Medicare Other

## 2012-01-31 DIAGNOSIS — N185 Chronic kidney disease, stage 5: Secondary | ICD-10-CM

## 2012-01-31 DIAGNOSIS — J9600 Acute respiratory failure, unspecified whether with hypoxia or hypercapnia: Principal | ICD-10-CM

## 2012-01-31 LAB — BLOOD GAS, ARTERIAL
Acid-base deficit: 2.5 mmol/L — ABNORMAL HIGH (ref 0.0–2.0)
Bicarbonate: 22.3 mEq/L (ref 20.0–24.0)
Drawn by: 252031
FIO2: 0.28 %
pCO2 arterial: 42.1 mmHg (ref 35.0–45.0)
pO2, Arterial: 87.8 mmHg (ref 80.0–100.0)

## 2012-01-31 LAB — PROTIME-INR
INR: 2.76 — ABNORMAL HIGH (ref 0.00–1.49)
Prothrombin Time: 27.8 seconds — ABNORMAL HIGH (ref 11.6–15.2)
Prothrombin Time: 29.6 seconds — ABNORMAL HIGH (ref 11.6–15.2)

## 2012-01-31 LAB — CBC
HCT: 35.2 % — ABNORMAL LOW (ref 36.0–46.0)
Hemoglobin: 11.5 g/dL — ABNORMAL LOW (ref 12.0–15.0)
MCH: 26.3 pg (ref 26.0–34.0)
RBC: 4.37 MIL/uL (ref 3.87–5.11)

## 2012-01-31 LAB — GLUCOSE, CAPILLARY
Glucose-Capillary: 161 mg/dL — ABNORMAL HIGH (ref 70–99)
Glucose-Capillary: 174 mg/dL — ABNORMAL HIGH (ref 70–99)

## 2012-01-31 LAB — URINE CULTURE

## 2012-01-31 LAB — PHOSPHORUS: Phosphorus: 3.5 mg/dL (ref 2.3–4.6)

## 2012-01-31 MED ORDER — WARFARIN SODIUM 5 MG PO TABS
5.0000 mg | ORAL_TABLET | Freq: Once | ORAL | Status: AC
  Administered 2012-01-31: 5 mg via ORAL
  Filled 2012-01-31: qty 1

## 2012-01-31 MED ORDER — WARFARIN - PHARMACIST DOSING INPATIENT
Freq: Every day | Status: DC
  Administered 2012-02-01: 18:00:00

## 2012-01-31 MED ORDER — NITROGLYCERIN 0.4 MG SL SUBL: 0.4000 mg | SUBLINGUAL_TABLET | SUBLINGUAL | Status: DC | PRN

## 2012-01-31 MED ORDER — ATORVASTATIN CALCIUM 20 MG PO TABS
20.0000 mg | ORAL_TABLET | Freq: Every day | ORAL | Status: DC
  Administered 2012-01-31 – 2012-02-01 (×2): 20 mg via ORAL
  Filled 2012-01-31 (×3): qty 1

## 2012-01-31 MED ORDER — PANTOPRAZOLE SODIUM 40 MG PO TBEC
40.0000 mg | DELAYED_RELEASE_TABLET | Freq: Every day | ORAL | Status: DC
  Administered 2012-01-31 – 2012-02-02 (×3): 40 mg via ORAL
  Filled 2012-01-31 (×3): qty 1

## 2012-01-31 NOTE — Clinical Social Work Psychosocial (Signed)
     Clinical Social Work Department BRIEF PSYCHOSOCIAL ASSESSMENT 01/31/2012  Patient:  [name], [name]     Account Number:  [id number]     Admit date:  01/29/2012  Clinical Social Worker:  Margaree Mackintosh  Date/Time:  01/31/2012 10:46 AM  Referred by:  CSW  Date Referred:  01/31/2012 Referred for  Other - See comment   Other Referral:   Support   Interview type:  Patient Other interview type:    PSYCHOSOCIAL DATA Living Status:  ALONE Admitted from facility:   Level of care:   Primary support name:   Primary support relationship to patient:   Degree of support available:   Dtr lives in same apartment complex; one floor below.    CURRENT CONCERNS Current Concerns  Other - See comment   Other Concerns:   Support.    SOCIAL WORK ASSESSMENT / PLAN Clinical Social Worker recieved referral.  CSW reviewed chart and met with pt at bedside; dtr on phone at bedside. CSW introduced self, explained role, and provided support. CSW provided opportunity for pt to process feelings.  Pt shared that she has had some difficulty obtaining medications.  Pt is linked with SNAP in the community.  Pt has no other concerns at this time.  CSW staffed case with RNCM.  CSW to sign off, please re consult if needed.   Assessment/plan status:  No Further Intervention Required Other assessment/ plan:   Information/referral to community resources:   RNCM to assist with Medications.  Phone number to pt's MD in community (pt states she "was supposed to call her MD" prior to admission to hospital)/.    PATIENTS/FAMILYS RESPONSE TO PLAN OF CARE: Pt was pleasant and engaged in conversation.  Pt thanked CSW for intervention.

## 2012-01-31 NOTE — Progress Notes (Signed)
ANTICOAGULATION CONSULT NOTE - Initial Consult  Pharmacy Consult for Coumadin Indication: atrial fibrillation  No Known Allergies  Patient Measurements: Height: 5\' 1"  (154.9 cm) Weight: 185 lb 10 oz (84.2 kg) IBW/kg (Calculated) : 47.8    Vital Signs: Temp: 98.1 F (36.7 C) (09/23 1100) Temp src: Core (Comment) (09/23 1100) BP: 110/64 mmHg (09/23 1100) Pulse Rate: 73  (09/23 1100)  Labs:  Basename 01/31/12 0430 01/30/12 0844 01/29/12 2239 01/29/12 2237  HGB 11.5* 11.8* -- --  HCT 35.2* 36.3 -- 39.9  PLT 220 231 -- 299  APTT -- -- -- --  LABPROT 27.8* 30.7* -- 30.5*  INR 2.76* 3.16* -- 3.13*  HEPARINUNFRC -- -- -- --  CREATININE -- 2.31* -- 2.06*  CKTOTAL -- -- -- --  CKMB -- -- -- --  TROPONINI -- -- <0.30 --    Estimated Creatinine Clearance: 22 ml/min (by C-G formula based on Cr of 2.31).   Medical History: Past Medical History  Diagnosis Date  . CAD (coronary artery disease)     cath 11/11: LAD 40%, mid-dist 25-30%; prox CFX 30%, mid 40%; OM 50%; PDA 50%; PLV 50%;  EF 65%  . Acute myocardial infarction     in setting of AFib with RVR 03/2010  . Atrial fibrillation     DCCV 11/17/11; coumadin & amiodarone  . HOCM (hypertrophic obstructive cardiomyopathy)     Echo 11/2011:severe LVH, EF 65-70%, mid-cavity gradient up to  . Hypertension   . Tobacco abuse   . Chronic kidney disease     Stage 4  . CHF (congestive heart failure)     preserved LVF  . Iron deficiency anemia   . Third degree uterine prolapse   . Hx MRSA infection     Medications:  Prescriptions prior to admission  Medication Sig Dispense Refill  . amiodarone (PACERONE) 200 MG tablet Take 1 tablet (200 mg total) by mouth daily. Take 200mg  daily starting 12/16/11  30 tablet  6  . ferrous sulfate 325 (65 FE) MG tablet Take 325 mg by mouth 2 (two) times daily.      . furosemide (LASIX) 20 MG tablet Take 20 mg by mouth daily.       . metoprolol tartrate (LOPRESSOR) 25 MG tablet Take 1 tablet  (25 mg total) by mouth 2 (two) times daily.  60 tablet  6  . pantoprazole (PROTONIX) 40 MG tablet Take 40 mg by mouth daily.      . rosuvastatin (CRESTOR) 10 MG tablet Take 10 mg by mouth daily.      Marland Kitchen warfarin (COUMADIN) 5 MG tablet Take 1 tablet (5 mg total) by mouth daily. Take 5mg  daily and have your INR checked on Monday 7/15 for further instructions on dosing  30 tablet  2  . nitroGLYCERIN (NITROSTAT) 0.4 MG SL tablet Place 0.4 mg under the tongue every 5 (five) minutes as needed. For chest pain        Assessment: Ms. Wees is known to pharmacy from antibiotic dosing. Noted she was on Coumadin PTA for Afib (CHADS2= 1) and was admitted with a supra-therapeutic INR.  Noted she took her last dose of Coumadin on 9/21 PTA.  Her home regimen is Coumadin 5mg  daily, as noted from outpatient clinic note. She has been on Amiodarone since July this year, so I am not concerned that this interaction will result in any acute/abrupt changes in INR at this time. INR today is therapeutic s/p held dose 9/22.  Goal of Therapy:  INR 2-3 Monitor platelets by anticoagulation protocol: Yes   Plan:  - Give Coumadin 5mg  PO today - Will f/up daily PT/INR and assess if resumption of home Coumadin regimen is appropriate. - Will f/up to reinforce Coumadin counseling points once patient is out of the ICU.  Thanks, Sachit Gilman K. Allena Katz, PharmD, BCPS.  Clinical Pharmacist Pager [phone]. 01/31/2012 1:00 PM

## 2012-01-31 NOTE — Progress Notes (Signed)
Name: [name] MRN: [id number] DOB: 02-11-1941    LOS: 2  PULMONARY / CRITICAL CARE MEDICINE  HPI:   71 years old female with PMH relevant for CAD, AMI, A. Fib with RVR, severe HOCM, HTN, CKD, CHF with prior intubations and MRSA infection. Admitted (again) with hypertensive emergency, VDRF due to pulmonary edema  Current Status: Stable  Interval  Extubated without incident 9/22 Taking PO Coughing   Vital Signs: Temp:  [97.6 F (36.4 C)-100.5 F (38.1 C)] 98.1 F (36.7 C) (09/23 1100) Pulse Rate:  [63-87] 73  (09/23 1100) Resp:  [20-36] 20  (09/23 1100) BP: (104-129)/(53-84) 110/64 mmHg (09/23 1100) SpO2:  [95 %-99 %] 98 % (09/23 1100) FiO2 (%):  [40 %] 40 % (09/22 1200) Weight:  [84.2 kg (185 lb 10 oz)-86.4 kg (190 lb 7.6 oz)] 84.2 kg (185 lb 10 oz) (09/23 0435)  Intake/Output Summary (Last 24 hours) at 01/31/12 1147 Last data filed at 01/31/12 1100  Gross per 24 hour  Intake    904 ml  Output   1570 ml  Net   -666 ml    Physical Examination: General: ill appearing woman, NAD Neuro:  Awake, following commands, nonfocal HEENT:  PERRL, pink conjunctivae, moist membranes Neck:  Supple, positive JVD   Cardiovascular:  RRR, no M/R/G Lungs:  Bilateral soft crackles >> improved Abdomen:  Soft, nontender, nondistended, bowel sounds present Musculoskeletal:  Moves all extremities, no pedal edema Skin:  No rash  Active Problems:  Renal insufficiency  Lactic acidosis  Respiratory failure  Flash pulmonary edema  Hyperglycemia   ASSESSMENT AND PLAN  PULMONARY  Lab 01/31/12 0544 01/30/12 1119 01/30/12 0421 01/30/12 0230  PHART 7.344* 7.391 7.355 7.275*  PCO2ART 42.1 34.0* 28.1* 42.7  PO2ART 87.8 69.0* 122.0* 124.0*  HCO3 22.3 20.5 15.7* 19.2*  O2SAT 98.0 93.0 99.0 98.0   Ventilator Settings: Vent Mode:  [-]  FiO2 (%):  [40 %] 40 % CXR:  9/23 >> large scale clearing of pulmonary infiltrates  ETT:  9/21>>9/22  A:   1) Acute hypoxemic respiratory  failure 2) Acute pulmonary edema in the setting of hypertensive emergency. P:   - push pulm hygiene - control BP - d/c abx given low likelihood PNA here  CARDIOVASCULAR  Lab 01/29/12 2239 01/29/12 2238  TROPONINI <0.30 --  LATICACIDVEN -- 5.3*  PROBNP 1089.0* --   ECG:  Intraventricular conduction delay likely related to rate. Lines: Peripheral lines.  A:  1) Hypertensive emergency 2) Lactic acidosis, likely secondary to organ hypoperfusion. P:  - home antihypertensive medications + amiodarone   RENAL  Lab 01/31/12 0430 01/30/12 0844 01/29/12 2237  NA -- 136 134*  K -- 4.2 3.7  CL -- 101 99  CO2 -- 20 17*  BUN -- 27* 18  CREATININE -- 2.31* 2.06*  CALCIUM -- 11.4* 11.8*  MG 2.2 1.8 --  PHOS 3.5 2.4 --   Intake/Output      09/22 0701 - 09/23 0700 09/23 0701 - 09/24 0700   P.O. 240 240   I.V. (mL/kg) 30 (0.4) 10 (0.1)   NG/GT 30    IV Piggyback 229 212.5   Total Intake(mL/kg) 529 (6.3) 462.5 (5.5)   Urine (mL/kg/hr) 1455 (0.7) 275 (0.7)   Emesis/NG output 250    Total Output 1705 275   Net -1176 +187.5         Foley:  9/21  A:   1) Acute on chronic renal failure in the setting of hypertensive emergency. Lab  Results  Component Value Date   CREATININE 2.31* 01/30/2012   CREATININE 2.06* 01/29/2012   CREATININE 1.61* 11/18/2011   P:   - check BMP 9/24  GASTROINTESTINAL  Lab 01/29/12 2237  AST 28  ALT 17  ALKPHOS 174*  BILITOT 0.2*  PROT 8.3  ALBUMIN 3.8    A:   1)  No issues P:   - GI prophylaxis with protonix  HEMATOLOGIC  Lab 01/31/12 0430 01/30/12 0844 01/29/12 2237  HGB 11.5* 11.8* 12.6  HCT 35.2* 36.3 39.9  PLT 220 231 299  INR 2.76* 3.16* 3.13*  APTT -- -- --   A:   1) Supratherapeutic INR Lab Results  Component Value Date   INR 2.76* 01/31/2012   INR 3.16* 01/30/2012   INR 3.13* 01/29/2012   P:  - Will hold coumadin and follow INR  INFECTIOUS  Lab 01/31/12 0430 01/30/12 0844 01/29/12 2237  WBC 19.0* 17.1* 15.5*    PROCALCITON -- -- --   Cultures: 9/22 bc>> 9/22 sputum>> not done 9/21 urine >> negative Antibiotics: Zosyn 9/21>> 9/23 vancomycin 9/21 >> 9/23  A:   1) Bilateral infiltrates likely secondary to pulmonary edema resolved, no evidence PNA P:   Stop all abx 9/23  ENDOCRINE  Lab 01/31/12 0753 01/31/12 0403 01/31/12 0006 01/30/12 2000 01/30/12 1542  GLUCAP 143* 161* 152* 170* 209*   A:     1) No history of diabetes   NEUROLOGIC  A:   1)  Neurologically intact P:     BEST PRACTICE / DISPOSITION - Level of Care:  ICU >> to tele 9/23 - Primary Service:  PCCM >> [facility] to assume care 9/24 - Consultants:  None - Code Status:  Full code - Diet:  Heart healthy - DVT Px:  SCD's - GI Px:  Protonix - Skin Integrity:  Intact  Levy Pupa, MD, PhD 01/31/2012, 11:47 AM [place] [facility] [facility] [phone] or if no answer [phone]

## 2012-01-31 NOTE — Progress Notes (Signed)
I cared for patient in the ED when she was admitted. I was glad to get and update on patient and hear she was doing so much better especially since her daughter had been very upset over her mother's condition.  Patient states she is being moved to another unit but would appreciate continued prayers.  Will follow up as needed.  Rutherford Nail Chaplain

## 2012-01-31 NOTE — Care Management Note (Signed)
    Page 1 of 2   02/02/2012     11:34:58 AM   CARE MANAGEMENT NOTE 02/02/2012  Patient:  [name], [name]   Account Number:  [id number]  Date Initiated:  01/31/2012  Documentation initiated by:  [name]  Subjective/Objective Assessment:   HTN crisis with resp failure.  Lives alone - independent - daughter lives in same building.     Action/Plan:   Anticipated DC Date:  02/04/2012   Anticipated DC Plan:  HOME W HOME HEALTH SERVICES      DC Planning Services  CM consult      [facility] Choice  HOME HEALTH   Choice offered to / List presented to:  C-1 Patient        HH arranged  HH-1 RN  HH-10 DISEASE MANAGEMENT      HH agency  [facility]   Status of service:  Completed, signed off Medicare Important Message given?  YES (If response is "NO", the following Medicare IM given date fields will be blank) Date Medicare IM given:  02/02/2012 Date Additional Medicare IM given:    Discharge Disposition:  HOME W HOME HEALTH SERVICES  Per UR Regulation:  Reviewed for med. necessity/level of care/duration of stay  If discussed at Long Length of Stay Meetings, dates discussed:    Comments:  ContactLucila, Klecka Daughter [phone]                [facility] Daughter [phone]  02-02-12 [address] [place] [phone] CM did speak to pt and she wants to use [facility] for services- Pt states she uses a cane at home and that she lives in [place] and the pt's daughter lives upstairs from her. CM did make referral for services with [facility]. No further needs from [facility].  01-31-12 11:15am [name], [name] [phone] Talked with patient and daughter at bedside. Patient states does not have a presciption for iron pills on discharge. Will need prescripiton if that is plan  for her to continue on iron.

## 2012-02-01 DIAGNOSIS — I5033 Acute on chronic diastolic (congestive) heart failure: Secondary | ICD-10-CM

## 2012-02-01 LAB — BASIC METABOLIC PANEL
BUN: 43 mg/dL — ABNORMAL HIGH (ref 6–23)
Calcium: 12.1 mg/dL — ABNORMAL HIGH (ref 8.4–10.5)
Creatinine, Ser: 2.4 mg/dL — ABNORMAL HIGH (ref 0.50–1.10)
GFR calc Af Amer: 22 mL/min — ABNORMAL LOW (ref >90)
GFR calc non Af Amer: 19 mL/min — ABNORMAL LOW (ref >90)

## 2012-02-01 LAB — CBC
HCT: 36.4 % (ref 36.0–46.0)
MCH: 26.4 pg (ref 26.0–34.0)
MCV: 82 fL (ref 78.0–100.0)
RBC: 4.44 MIL/uL (ref 3.87–5.11)
WBC: 11.6 10*3/uL — ABNORMAL HIGH (ref 4.0–10.5)

## 2012-02-01 LAB — PHOSPHORUS: Phosphorus: 2.5 mg/dL (ref 2.3–4.6)

## 2012-02-01 LAB — PRO B NATRIURETIC PEPTIDE: Pro B Natriuretic peptide (BNP): 1550 pg/mL — ABNORMAL HIGH (ref 0–125)

## 2012-02-01 MED ORDER — METOPROLOL SUCCINATE ER 25 MG PO TB24
25.0000 mg | ORAL_TABLET | Freq: Two times a day (BID) | ORAL | Status: DC
  Administered 2012-02-01 – 2012-02-02 (×2): 25 mg via ORAL
  Filled 2012-02-01 (×3): qty 1

## 2012-02-01 MED ORDER — WARFARIN SODIUM 5 MG PO TABS
5.0000 mg | ORAL_TABLET | Freq: Once | ORAL | Status: AC
  Administered 2012-02-01: 5 mg via ORAL
  Filled 2012-02-01: qty 1

## 2012-02-01 MED ORDER — FUROSEMIDE 40 MG PO TABS
40.0000 mg | ORAL_TABLET | Freq: Every day | ORAL | Status: DC
  Administered 2012-02-02: 40 mg via ORAL
  Filled 2012-02-01 (×3): qty 1

## 2012-02-01 MED ORDER — FUROSEMIDE 10 MG/ML IJ SOLN
40.0000 mg | Freq: Once | INTRAMUSCULAR | Status: AC
  Administered 2012-02-01: 40 mg via INTRAVENOUS
  Filled 2012-02-01: qty 4

## 2012-02-01 MED ORDER — POTASSIUM CHLORIDE CRYS ER 20 MEQ PO TBCR
20.0000 meq | EXTENDED_RELEASE_TABLET | Freq: Every day | ORAL | Status: DC
  Administered 2012-02-02: 20 meq via ORAL
  Filled 2012-02-01: qty 1

## 2012-02-01 NOTE — Progress Notes (Signed)
ANTICOAGULATION CONSULT NOTE - Follow Up Consult  Pharmacy Consult for Coumadin Indication: atrial fibrillation  No Known Allergies  Patient Measurements: Height: 5\' 1"  (154.9 cm) Weight: 185 lb 10 oz (84.2 kg) IBW/kg (Calculated) : 47.8    Vital Signs: Temp: 98.5 F (36.9 C) (09/24 0500) Temp src: Oral (09/24 0500) BP: 107/55 mmHg (09/24 0500) Pulse Rate: 68  (09/24 0500)  Labs:  Basename 02/01/12 1216 02/01/12 0605 01/31/12 1331 01/31/12 0430 01/30/12 0844 01/29/12 2239 01/29/12 2237  HGB 11.7* -- -- 11.5* -- -- --  HCT 36.4 -- -- 35.2* 36.3 -- --  PLT 200 -- -- 220 231 -- --  APTT -- -- -- -- -- -- --  LABPROT 28.0* -- 29.6* 27.8* -- -- --  INR 2.79* -- 3.01* 2.76* -- -- --  HEPARINUNFRC -- -- -- -- -- -- --  CREATININE -- 2.40* -- -- 2.31* -- 2.06*  CKTOTAL -- -- -- -- -- -- --  CKMB -- -- -- -- -- -- --  TROPONINI -- -- -- -- -- <0.30 --    Estimated Creatinine Clearance: 21.2 ml/min (by C-G formula based on Cr of 2.4).  Assessment: INR today is therapeutic at 2.79 after a held dose on 9/22. Her home regimen is Coumadin 5 mg daily and has been on amiodarone since July this year, so not concerned that this interaction will result in any acute changes in INR at this time.  H/H stable, platelets stable (current level pending) No reported bleeding Goal of Therapy:  INR 2-3 Monitor platelets by anticoagulation protocol: Yes   Plan:  -Continue Coumadin 5 mg PO today -Follow-up daily PT/INR and assess if resumption of home Coumadin regimen is appropriate.    [name] 02/01/2012,1:24 PM

## 2012-02-01 NOTE — Progress Notes (Addendum)
[facility] PROGRESS NOTE  AJNA MOORS ZOX:[id number] DOB: 1940/10/11 DOA: 01/29/2012 PCP: Oliver Barre, MD  Assessment/Plan: Active Problems:  HYPERTENSION  CHRONIC KIDNEY DISEASE STAGE V  CARDIOMYOPATHY, IDIOPATHIC HYPERTROPHIC  FIBRILLATION, ATRIAL  Dyslipidemia  Chronic diastolic heart failure  CAD  Renal insufficiency  Lactic acidosis  Respiratory failure  Flash pulmonary edema  Hyperglycemia  Hypercalcemia  1. Acute hypoxemic respiratory failure due to  Acute pulmonary edema in the setting of hypertensive emergency. - patient was intubated in the emergency room on 9/21 and received iv furosemide. She was extubated on 9/22 and remained stable respiratory wise. Empiric abx were discontinued on 9/23 after 2 days of Vanc and zosyn .    2. Hx of afib with RVR s/p DCCV in 07/13 - on chronic amiodarone and coumadin   3. Acute on chronic diastolic chf - continue to diurese. C/w BB. Not a candidate for acei   4. Leukocytosis - ? demargination - Patient has also mentioned diarrhea since coming to the hospital - probably related to zosyn but will check c diff to be complete. repeated CBC today    5. CKD stage 4 - monitor creatinine closely with diuresis  6. Hypercalcemia - ? Due to CKD, but check PTH   Code Status: full Family Communication:  Disposition Plan: home   Brief narrative: 71 years old female with PMH relevant for CAD, AMI, A. Fib with RVR, severe HOCM, HTN, CKD, CHF with prior intubations and MRSA infection. Presents with sudden onset of sever SOB. As per th At admission to the ED found to be severely hypertensive. She was intubated for hypoxemic respiratory failure. Chest X ray showed bilateral infiltrates compatible with acute pulmonary edema. The patient was started on NTG drip with good response. At the time of my exam the patient is intubated, synchronous with the ventilator, calm, awake and following commands. Hemodynamically stable, saturating 100% on 100%  FiO2.   Consultants:  PCCM  Procedures:  Intubation and mechanical ventilation   Antibiotics:  none  HPI/Subjective: Dyspnea with minimal exertion   Objective: Filed Vitals:   01/31/12 1300 01/31/12 1425 01/31/12 2100 02/01/12 0500  BP:  157/90 127/68 107/55  Pulse: 64 63 68 68  Temp: 98.4 F (36.9 C) 97.7 F (36.5 C) 98.1 F (36.7 C) 98.5 F (36.9 C)  TempSrc: Core (Comment) Oral Oral Oral  Resp: 30 25    Height:      Weight:      SpO2: 99% 100% 100% 100%    Intake/Output Summary (Last 24 hours) at 02/01/12 1049 Last data filed at 02/01/12 1000  Gross per 24 hour  Intake  967.5 ml  Output    700 ml  Net  267.5 ml   Filed Weights   01/29/12 2216 01/30/12 1300 01/31/12 0435  Weight: 86.4 kg (190 lb 7.6 oz) 86.4 kg (190 lb 7.6 oz) 84.2 kg (185 lb 10 oz)    Exam:   General:  axox3  Cardiovascular: RRR, 3/6 systolic mrumur  Respiratory: bilateral crackles  Abdomen: soft, nt  Data Reviewed: Basic Metabolic Panel:  Lab 02/01/12 8119 01/31/12 0430 01/30/12 0844 01/29/12 2237  NA 139 -- 136 134*  K 4.2 -- 4.2 3.7  CL 104 -- 101 99  CO2 26 -- 20 17*  GLUCOSE 118* -- 215* 321*  BUN 43* -- 27* 18  CREATININE 2.40* -- 2.31* 2.06*  CALCIUM 12.1* -- 11.4* 11.8*  MG 2.1 2.2 1.8 --  PHOS 2.5 3.5 2.4 --  Liver Function Tests:  Lab 01/29/12 2237  AST 28  ALT 17  ALKPHOS 174*  BILITOT 0.2*  PROT 8.3  ALBUMIN 3.8   No results found for this basename: LIPASE:5,AMYLASE:5 in the last 168 hours No results found for this basename: AMMONIA:5 in the last 168 hours CBC:  Lab 01/31/12 0430 01/30/12 0844 01/29/12 2237  WBC 19.0* 17.1* 15.5*  NEUTROABS -- -- 8.3*  HGB 11.5* 11.8* 12.6  HCT 35.2* 36.3 39.9  MCV 80.5 81.6 83.8  PLT 220 231 299   Cardiac Enzymes:  Lab 01/29/12 2239  CKTOTAL --  CKMB --  CKMBINDEX --  TROPONINI <0.30   BNP (last 3 results)  Basename 02/01/12 0605 01/29/12 2239 11/15/11 0125  PROBNP 1550.0* 1089.0* 661.9*    CBG:  Lab 01/31/12 1200 01/31/12 0753 01/31/12 0403 01/31/12 0006 01/30/12 2000  GLUCAP 174* 143* 161* 152* 170*    Recent Results (from the past 240 hour(s))  CULTURE, BLOOD (ROUTINE X 2)     Status: Normal (Preliminary result)   Collection Time   01/29/12 10:30 PM      Component Value Range Status Comment   Specimen Description BLOOD RIGHT HAND   Final    Special Requests BOTTLES DRAWN AEROBIC AND ANAEROBIC 5CC   Final    Culture  Setup Time 01/30/2012 11:39   Final    Culture     Final    Value:        BLOOD CULTURE RECEIVED NO GROWTH TO DATE CULTURE WILL BE HELD FOR 5 DAYS BEFORE ISSUING A FINAL NEGATIVE REPORT   Report Status PENDING   Incomplete   CULTURE, BLOOD (ROUTINE X 2)     Status: Normal (Preliminary result)   Collection Time   01/29/12 10:45 PM      Component Value Range Status Comment   Specimen Description BLOOD RIGHT HAND   Final    Special Requests BOTTLES DRAWN AEROBIC ONLY 10CC   Final    Culture  Setup Time 01/30/2012 11:39   Final    Culture     Final    Value:        BLOOD CULTURE RECEIVED NO GROWTH TO DATE CULTURE WILL BE HELD FOR 5 DAYS BEFORE ISSUING A FINAL NEGATIVE REPORT   Report Status PENDING   Incomplete   URINE CULTURE     Status: Normal   Collection Time   01/29/12 10:48 PM      Component Value Range Status Comment   Specimen Description URINE, RANDOM   Final    Special Requests NONE   Final    Culture  Setup Time 01/30/2012 11:38   Final    Colony Count NO GROWTH   Final    Culture NO GROWTH   Final    Report Status 01/31/2012 FINAL   Final   MRSA PCR SCREENING     Status: Normal   Collection Time   01/30/12  3:37 AM      Component Value Range Status Comment   MRSA by PCR NEGATIVE  NEGATIVE Final      Studies: Dg Chest Port 1 View  01/31/2012  *RADIOLOGY REPORT*  Clinical Data: Pulmonary edema.  Shortness of breath.  PORTABLE CHEST - 1 VIEW  Comparison: 09/22 and 01/29/2012  Findings: Endotracheal tube and NG tube have been removed.  Pulmonary edema has resolved on the left with minimal residual haziness in the right perihilar region.  Heart size has diminished. Vascularity is now normal.  IMPRESSION: Almost complete  resolution of the pulmonary edema.   Original Report Authenticated By: Gwynn Burly, M.D.    Dg Chest Port 1 View  01/30/2012  *RADIOLOGY REPORT*  Clinical Data: Endotracheal tube,  PORTABLE CHEST - 1 VIEW  Comparison: 01/29/2012  Findings: Cardiomegaly again noted.  Endotracheal tube in place with tip 3.8 cm above the carina.  NG tube in place.  Mild improvement in aeration.  Residual mild interstitial edema.  Mild basilar atelectasis.  No segmental infiltrate.  IMPRESSION: Endotracheal tube in place with tip 3.8 cm above the carina.  NG tube in place.  Mild improvement in aeration.  Residual mild interstitial edema.  Mild basilar atelectasis.  No segmental infiltrate.   Original Report Authenticated By: Natasha Mead, M.D.     Scheduled Meds:    . amiodarone  200 mg Oral Daily  . atorvastatin  20 mg Oral q1800  . furosemide  40 mg Intravenous Once  . furosemide  40 mg Oral Q breakfast  . metoprolol tartrate  25 mg Oral BID  . pantoprazole  40 mg Oral Q1200  . potassium chloride  20 mEq Oral Daily  . warfarin  5 mg Oral ONCE-1800  . Warfarin - Pharmacist Dosing Inpatient   Does not apply q1800  . DISCONTD: antiseptic oral rinse  15 mL Mouth Rinse BID  . DISCONTD: furosemide  20 mg Oral BID  . DISCONTD: pantoprazole (PROTONIX) IV  40 mg Intravenous QHS  . DISCONTD: piperacillin-tazobactam (ZOSYN)  IV  3.375 g Intravenous Q8H  . DISCONTD: potassium chloride  20 mEq Oral BID  . DISCONTD: vancomycin  1,000 mg Intravenous Q48H   Continuous Infusions:    . DISCONTD: nitroGLYCERIN Stopped (01/29/12 2312)    Active Problems:  HYPERTENSION  CHRONIC KIDNEY DISEASE STAGE V  CARDIOMYOPATHY, IDIOPATHIC HYPERTROPHIC  FIBRILLATION, ATRIAL  Dyslipidemia  Chronic diastolic heart failure  CAD  Renal  insufficiency  Lactic acidosis  Respiratory failure  Flash pulmonary edema  Hyperglycemia  Hypercalcemia        Kalisi Bevill  [facility] Pager [phone]. If 8PM-8AM, please contact night-coverage at [email or web address], password [facility] 02/01/2012, 10:49 AM  LOS: 3 days

## 2012-02-01 NOTE — Consult Note (Signed)
Cardiology Consult Note   Patient ID: [name] MRN: [id number], DOB/AGE: [birth year]   Admit date: 01/29/2012 Date of Consult: 02/01/2012  Primary Physician: Oliver Barre, MD Primary Cardiologist: Rollene Rotunda, MD  Reason for consult: evaluation/management of acute on chronic diastolic CHF  HPI: [name] is a 71yo female with PMHx significant for CAD, PAF, HOCM, HTN, tobacco abuse and CKD, stage 4 admitted 01/29/12 for acute pulmonary edema with resultant acute hypoxemic respiratory failure secondary to hypertensive emergency.   Last cath 03/2010 revealed 40% mid LAD, 25-30% mid-distal LAD, 30 prox 40% mid LCx, 50% OM, 50% PDA, 50% PLV; LVEF 65%. This was performed in the setting of NSTEMI. No culprit lesion was identified, and the NSTEMI was subsequently characterized as type 2. She has HOCM- echo 11/2011 revealed LVEF 65-70%, severe LVH, mid-cavity gradient to 81 mmHg, moderate LA, mild RA dilatation, PASP 37 mmHg, normal RV size & function. She was recently hospitalized in 11/2011 for a-fib with RVR and resultant CHF. She then developed a NSTEMI believed to be type 2 secondary to a-fib + RVR. She underwent successful DCCV and was initiated on amiodarone. She was discharged on amiodarone and Lopressor with continued Coumadin use.  She reports going to a cookout on 01/29/12. She ate a chicken breast and hotdog. She drove home with her daughter. Before she stepped out of the car, she noticed she was short of breath. EMS was called and transported her to [facility] ED. While waiting for their arrival and in transit, her breathing got marked worse. She does not remember much after they picked her up. She denies chest pain, DOE, PND, LE edema, palpitations, lightheadedness, n/v, diaphoresis, fevers, chills, new cough, medication changes, noncompliance, EtOH or elicit drug use. She continues to smoke cigarettes. She does state she has not been able to refill one medication, but cannot recall the  name.  She presented to the ED on 01/29/12 severely dyspneic and hypoxemic. Her BP was markedly elevated (>200/>130). She was intubated for hypoxemic resp failure. EKG revealed sinus tachycardia. Initial trop-I returned WNL. pBNP 1089. CXR revealed pulmonary edema. NTG gtt and Lasix IV was started with good response. She diuresed well (I/O - 1.1 L overnight; total weight - 5 lbs since admission) and was extubated the following day. She has remained stable and was transferred to telemetry. She has had prior CHF exacerbations requiring intubation in the past. CXR yesterday revealed complete resolution of pulmonary edema.  Problem List: Past Medical History  Diagnosis Date  . CAD (coronary artery disease)     cath 11/11: LAD 40%, mid-dist 25-30%; prox CFX 30%, mid 40%; OM 50%; PDA 50%; PLV 50%;  EF 65%  . Acute myocardial infarction     in setting of AFib with RVR 03/2010  . Atrial fibrillation     DCCV 11/17/11; coumadin & amiodarone  . HOCM (hypertrophic obstructive cardiomyopathy)     Echo 11/2011:severe LVH, EF 65-70%, mid-cavity gradient up to  . Hypertension   . Tobacco abuse   . Chronic kidney disease     Stage 4  . CHF (congestive heart failure)     preserved LVF  . Iron deficiency anemia   . Third degree uterine prolapse   . Hx MRSA infection     Past Surgical History  Procedure Date  . None   . Cardioversion 11/17/2011    Procedure: CARDIOVERSION;  Surgeon: Hillis Range, MD;  Location: [facility] OR;  Service: Cardiovascular;  Laterality: N/A;  Allergies: No Known Allergies  Home Medications: Prior to Admission medications   Medication Sig Start Date End Date Taking? Authorizing Provider  amiodarone (PACERONE) 200 MG tablet Take 1 tablet (200 mg total) by mouth daily. Take 200mg  daily starting 12/16/11 12/16/11 12/15/12 Yes Jessica A Hope, PA-C  ferrous sulfate 325 (65 FE) MG tablet Take 325 mg by mouth 2 (two) times daily.   Yes Historical Provider, MD  furosemide (LASIX) 20  MG tablet Take 20 mg by mouth daily.  08/18/11  Yes Beatrice Lecher, PA  metoprolol tartrate (LOPRESSOR) 25 MG tablet Take 1 tablet (25 mg total) by mouth 2 (two) times daily. 11/18/11 11/17/12 Yes Jessica A Hope, PA-C  pantoprazole (PROTONIX) 40 MG tablet Take 40 mg by mouth daily. 02/04/11  Yes Rollene Rotunda, MD  rosuvastatin (CRESTOR) 10 MG tablet Take 10 mg by mouth daily.   Yes Historical Provider, MD  warfarin (COUMADIN) 5 MG tablet Take 1 tablet (5 mg total) by mouth daily. Take 5mg  daily and have your INR checked on Monday 7/15 for further instructions on dosing 11/18/11  Yes Jessica A Hope, PA-C  nitroGLYCERIN (NITROSTAT) 0.4 MG SL tablet Place 0.4 mg under the tongue every 5 (five) minutes as needed. For chest pain    Historical Provider, MD    Inpatient Medications:     . amiodarone  200 mg Oral Daily  . atorvastatin  20 mg Oral q1800  . furosemide  40 mg Intravenous Once  . furosemide  40 mg Oral Q breakfast  . metoprolol tartrate  25 mg Oral BID  . pantoprazole  40 mg Oral Q1200  . potassium chloride  20 mEq Oral Daily  . warfarin  5 mg Oral ONCE-1800  . warfarin  5 mg Oral ONCE-1800  . Warfarin - Pharmacist Dosing Inpatient   Does not apply q1800  . DISCONTD: furosemide  20 mg Oral BID  . DISCONTD: potassium chloride  20 mEq Oral BID   Prescriptions prior to admission  Medication Sig Dispense Refill  . amiodarone (PACERONE) 200 MG tablet Take 1 tablet (200 mg total) by mouth daily. Take 200mg  daily starting 12/16/11  30 tablet  6  . ferrous sulfate 325 (65 FE) MG tablet Take 325 mg by mouth 2 (two) times daily.      . furosemide (LASIX) 20 MG tablet Take 20 mg by mouth daily.       . metoprolol tartrate (LOPRESSOR) 25 MG tablet Take 1 tablet (25 mg total) by mouth 2 (two) times daily.  60 tablet  6  . pantoprazole (PROTONIX) 40 MG tablet Take 40 mg by mouth daily.      . rosuvastatin (CRESTOR) 10 MG tablet Take 10 mg by mouth daily.      Marland Kitchen warfarin (COUMADIN) 5 MG tablet Take 1  tablet (5 mg total) by mouth daily. Take 5mg  daily and have your INR checked on Monday 7/15 for further instructions on dosing  30 tablet  2  . nitroGLYCERIN (NITROSTAT) 0.4 MG SL tablet Place 0.4 mg under the tongue every 5 (five) minutes as needed. For chest pain        Family History  Problem Relation Age of Onset  . Coronary artery disease Neg Hx   . Atrial fibrillation Neg Hx      History   Social History  . Marital Status: Single    Spouse Name: N/A    Number of Children: N/A  . Years of Education: N/A   Occupational History  .  Not on file.   Social History Main Topics  . Smoking status: Current Every Day Smoker -- 0.2 packs/day for 30 years    Types: Cigarettes  . Smokeless tobacco: Never Used  . Alcohol Use: No  . Drug Use: No  . Sexually Active: No   Other Topics Concern  . Not on file   Social History Narrative   The patient lives with her son . He is not present to provide history. She currently 1-pack per day her family account. Denies any  Drugs any drugs or alcohol per family     Review of Systems: General: negative for chills, fever, night sweats or weight changes.  Cardiovascular: positive of shortness of breath, negative for chest pain, dyspnea on exertion, edema, orthopnea, palpitations and paroxysmal nocturnal dyspnea Dermatological: negative for rash Respiratory: negative for cough or wheezing Urologic: negative for hematuria Abdominal: negative for nausea, vomiting, diarrhea, bright red blood per rectum, melena, or hematemesis Neurologic: negative for visual changes, syncope, or dizziness All other systems reviewed and are otherwise negative except as noted above.  Physical Exam: Blood pressure 107/55, pulse 68, temperature 98.5 F (36.9 C), temperature source Oral, resp. rate 25, height 5\' 1"  (1.549 m), weight 84.2 kg (185 lb 10 oz), SpO2 100.00%.    General: Well developed, well nourished, in no acute distress. Head: Normocephalic, atraumatic,  sclera non-icteric, no xanthomas, nares are without discharge.  Neck: Negative for carotid bruits. JVD not elevated. Lungs: Diffuse expiratory wheezing, no rales or rhonchi. Breathing is unlabored. Heart: RRR with S1 S2. No murmurs, rubs, or gallops appreciated. Abdomen: Soft, non-tender, non-distended with normoactive bowel sounds. No hepatomegaly. No rebound/guarding. No obvious abdominal masses. Msk:  Strength and tone appears normal for age. Extremities: No clubbing, cyanosis or edema.  Distal pedal pulses are 2+ and equal bilaterally. Neuro: Alert and oriented X 3. Moves all extremities spontaneously. Psych:  Responds to questions appropriately with a normal affect.  Labs: Recent Labs  Basename 02/01/12 1216 01/31/12 0430   WBC 11.6* 19.0*   HGB 11.7* 11.5*   HCT 36.4 35.2*   MCV 82.0 80.5   PLT 200 220   Lab 02/01/12 0605 01/30/12 0844 01/29/12 2237  NA 139 136 134*  K 4.2 4.2 3.7  CL 104 101 99  CO2 26 20 17*  BUN 43* 27* 18  CREATININE 2.40* 2.31* 2.06*  CALCIUM 12.1* 11.4* 11.8*  PROT -- -- 8.3  BILITOT -- -- 0.2*  ALKPHOS -- -- 174*  ALT -- -- 17  AST -- -- 28  AMYLASE -- -- --  LIPASE -- -- --  GLUCOSE 118* 215* 321*   Recent Labs  Basename 01/29/12 2239   CKTOTAL --   CKMB --   CKMBINDEX --   TROPONINI <0.30   Radiology/Studies: Dg Chest Port 1 View  01/31/2012  *RADIOLOGY REPORT*  Clinical Data: Pulmonary edema.  Shortness of breath.  PORTABLE CHEST - 1 VIEW  Comparison: 09/22 and 01/29/2012  Findings: Endotracheal tube and NG tube have been removed. Pulmonary edema has resolved on the left with minimal residual haziness in the right perihilar region.  Heart size has diminished. Vascularity is now normal.  IMPRESSION: Almost complete resolution of the pulmonary edema.   Original Report Authenticated By: Gwynn Burly, M.D.    Dg Chest Port 1 View  01/30/2012  *RADIOLOGY REPORT*  Clinical Data: Endotracheal tube,  PORTABLE CHEST - 1 VIEW  Comparison:  01/29/2012  Findings: Cardiomegaly again noted.  Endotracheal tube in place  with tip 3.8 cm above the carina.  NG tube in place.  Mild improvement in aeration.  Residual mild interstitial edema.  Mild basilar atelectasis.  No segmental infiltrate.  IMPRESSION: Endotracheal tube in place with tip 3.8 cm above the carina.  NG tube in place.  Mild improvement in aeration.  Residual mild interstitial edema.  Mild basilar atelectasis.  No segmental infiltrate.   Original Report Authenticated By: Natasha Mead, M.D.    Dg Chest Portable 1 View  01/29/2012  *RADIOLOGY REPORT*  Clinical Data: Shortness of breath  PORTABLE CHEST - 1 VIEW  Comparison: 01/29/2012  Findings: The endotracheal tube tip is above the carina.  There is mild moderate cardiac enlargement.  Interstitial edema pattern has increased from previous exam  IMPRESSION:  1.  ET tube tip is above the carina. 2.  Increase in interstitial edema.   Original Report Authenticated By: Rosealee Albee, M.D.    Dg Chest Portable 1 View  01/29/2012  *RADIOLOGY REPORT*  Clinical Data: Shortness of breath  PORTABLE CHEST - 1 VIEW  Comparison: 11/15/2011  Findings: Moderate cardiac enlargement.  There is moderate diffuse interstitial edema.  Small amount of pleural fluid is identified along the major fissure of the right lung.  Review of the visualized osseous structures is unremarkable.  IMPRESSION:  1.  Cardiac enlargement and moderate pulmonary edema.   Original Report Authenticated By: Rosealee Albee, M.D.    EKG: sinus tachycardia, 130 bpm, LVH with IVCD  ASSESSMENT:   1. Acute respiratory failure 2. Hypertensive emergency 3. Acute on chronic diastolic CHF 4. HOCM 5. PAF 6. A/CKD, stage 4 7. HTN 8. Tobacco abuse   DISCUSSION/PLAN:  Dietary indiscretion is seemingly the exacerbating factor of her CHF exacerbation this admission. There is no evidence of atrial fibrillation or sustained tachyarrhythmia aside from the sinus tachycardia on ED arrival.  She was also markedly hypertensive. This can be attributed to the large amount of sodium consumed at the barbecue earlier in the day, thus resulting in volume overload and placing excessive demands on an already compromised myocardium. She has diuresed well, been extubated and is breathing independently with minimal effort today. She is euvolemic on exam. CXR yesterday reveals complete resolution of pulmonary edema. I have discussed the importance of sodium and fluid restriction. She does not own a scale. Dr. Lavera Guise has consulted case management to assist with this and CHF home screen. Her medication compliance was praised, and again stressed. She would benefit from Lasix PRN dosing based on weight increase and symptom development. Complicating this is the fact that her baseline renal dysfunction (? A/CKD 2/2 hypertensive emergency; cardiorenal) limits the use of aggressive diuresis. Will discuss any further recommendations with MD. After discussing, will increase BB therapy to Toprol-XL BID. Will also increase daily Lasix dose to 40mg .   Signed, R. Hurman Horn, PA-C 02/01/2012, 2:55 PM  Patient seen with PA, agree with the above note.  She is doing well at this point.  Suspect acute on chronic diastolic CHF in the setting of sodium load from cookout, with hypertension triggered by pulmonary edema/high work of breathing/anxiety.  BP is now well-controlled on minimal meds.  1. Acute on chronic diastolic CHF: She has lost 5 lbs in the hospital.  Volume status looks ok today.  No JVD.  Continue Lasix at higher dose, 40 mg daily.  Will need to followup renal fxn in the morning.   2. Hypertrophic cardiomyopathy variant: She has a severe mid-cavity gradient.  LLSB soft  systolic murmur on exam.  Push beta blocker as tolerated.  HR in the 60s today, aim for 50s.  Change from metoprolol to Toprol XL 25 mg bid and titrate up over time as tolerated aiming for HR in the 50s.   3. Atrial fibrillation: Currently in NSR  on amiodarone.  She looks like she was in atrial fibrillation initially in the hospital on first ECG.  She will tolerate atrial fibrillation with high rates poorly with her mid-cavity gradient.  Continue amiodarone and warfarin.   Marca Ancona 02/01/2012 5:05 PM

## 2012-02-01 NOTE — Progress Notes (Signed)
Pt reporting having diarrhea ever since admission. MD called C-diff culture to be gotten, supplies in room, precautions placed on door. Will monitor

## 2012-02-02 DIAGNOSIS — A0472 Enterocolitis due to Clostridium difficile, not specified as recurrent: Secondary | ICD-10-CM | POA: Diagnosis present

## 2012-02-02 DIAGNOSIS — I2510 Atherosclerotic heart disease of native coronary artery without angina pectoris: Secondary | ICD-10-CM

## 2012-02-02 LAB — POCT I-STAT 3, ART BLOOD GAS (G3+)
Bicarbonate: 21 mEq/L (ref 20.0–24.0)
Bicarbonate: 21.1 mEq/L (ref 20.0–24.0)
O2 Saturation: 84 %
TCO2: 22 mmol/L (ref 0–100)
pCO2 arterial: 43.6 mmHg (ref 35.0–45.0)
pCO2 arterial: 44.2 mmHg (ref 35.0–45.0)
pH, Arterial: 7.291 — ABNORMAL LOW (ref 7.350–7.450)
pO2, Arterial: 58 mmHg — ABNORMAL LOW (ref 80.0–100.0)
pO2, Arterial: 55 mmHg — ABNORMAL LOW (ref 80.0–100.0)

## 2012-02-02 LAB — BASIC METABOLIC PANEL
Chloride: 102 mEq/L (ref 96–112)
GFR calc Af Amer: 22 mL/min — ABNORMAL LOW (ref >90)
GFR calc non Af Amer: 19 mL/min — ABNORMAL LOW (ref >90)
Potassium: 4.3 mEq/L (ref 3.5–5.1)
Sodium: 138 mEq/L (ref 135–145)

## 2012-02-02 LAB — CBC
HCT: 36 % (ref 36.0–46.0)
Hemoglobin: 11.4 g/dL — ABNORMAL LOW (ref 12.0–15.0)
MCHC: 31.7 g/dL (ref 30.0–36.0)
RBC: 4.33 MIL/uL (ref 3.87–5.11)
WBC: 8.6 10*3/uL (ref 4.0–10.5)

## 2012-02-02 LAB — PROTIME-INR: Prothrombin Time: 28 seconds — ABNORMAL HIGH (ref 11.6–15.2)

## 2012-02-02 LAB — CLOSTRIDIUM DIFFICILE BY PCR: Toxigenic C. Difficile by PCR: POSITIVE — AB

## 2012-02-02 MED ORDER — FUROSEMIDE 40 MG PO TABS: 40.0000 mg | ORAL_TABLET | Freq: Every day | ORAL | Status: DC

## 2012-02-02 MED ORDER — WARFARIN SODIUM 5 MG PO TABS
5.0000 mg | ORAL_TABLET | Freq: Every day | ORAL | Status: DC
  Filled 2012-02-02: qty 1

## 2012-02-02 MED ORDER — METRONIDAZOLE 500 MG PO TABS
500.0000 mg | ORAL_TABLET | Freq: Three times a day (TID) | ORAL | Status: DC
  Filled 2012-02-02 (×3): qty 1

## 2012-02-02 MED ORDER — VANCOMYCIN 50 MG/ML ORAL SOLUTION
125.0000 mg | Freq: Four times a day (QID) | ORAL | Status: DC
  Administered 2012-02-02: 125 mg via ORAL
  Filled 2012-02-02 (×4): qty 2.5

## 2012-02-02 MED ORDER — METRONIDAZOLE 500 MG PO TABS: 500.0000 mg | ORAL_TABLET | Freq: Three times a day (TID) | ORAL | Status: DC

## 2012-02-02 MED ORDER — VANCOMYCIN 50 MG/ML ORAL SOLUTION: 125.0000 mg | Freq: Four times a day (QID) | ORAL | Status: DC

## 2012-02-02 NOTE — Progress Notes (Signed)
ANTICOAGULATION CONSULT NOTE - Follow Up Consult  Pharmacy Consult for Coumadin Indication: atrial fibrillation  No Known Allergies  Patient Measurements: Height: 5\' 1"  (154.9 cm) Weight: 181 lb 11.2 oz (82.419 kg) IBW/kg (Calculated) : 47.8    Vital Signs: Temp: 98.5 F (36.9 C) (09/25 0500) Temp src: Oral (09/25 0500) BP: 117/59 mmHg (09/25 0500) Pulse Rate: 60  (09/25 0500)  Labs:  Basename 02/02/12 0618 02/01/12 1216 02/01/12 0605 01/31/12 1331 01/31/12 0430  HGB 11.4* 11.7* -- -- --  HCT 36.0 36.4 -- -- 35.2*  PLT 247 200 -- -- 220  APTT -- -- -- -- --  LABPROT 28.0* 28.0* -- 29.6* --  INR 2.79* 2.79* -- 3.01* --  HEPARINUNFRC -- -- -- -- --  CREATININE 2.39* -- 2.40* -- --  CKTOTAL -- -- -- -- --  CKMB -- -- -- -- --  TROPONINI -- -- -- -- --    Estimated Creatinine Clearance: 21 ml/min (by C-G formula based on Cr of 2.39).  Assessment: INR today is therapeutic at 2.79 after a held dose on 9/22 and 5 mg given 9/23 and 9/24. Her home regimen is Coumadin 5 mg daily and has been on amiodarone since July this year.  H/H stable, platelets stable. No reported bleeding Goal of Therapy:  INR 2-3   Plan:  1. Resume home coumadin 5 mg daily 2. Cut INRs back to MWF only Herby Abraham, Pharm.D. [phone] 02/02/2012 9:02 AM

## 2012-02-02 NOTE — Progress Notes (Signed)
SUBJECTIVE:  No SOB.  No chest pain.     PHYSICAL EXAM Filed Vitals:   02/01/12 2100 02/01/12 2134 02/02/12 0500 02/02/12 0700  BP: 104/58  117/59   Pulse: 59 60 60   Temp: 98.5 F (36.9 C)  98.5 F (36.9 C)   TempSrc: Oral  Oral   Resp:      Height:      Weight:    181 lb 11.2 oz (82.419 kg)  SpO2: 100%  100%    General:  No distress Lungs:  Clear Heart:  RRR Abdomen:  Positive bowel sounds, no rebound no guarding Extremities:  No edema.  LABS:  Results for orders placed during the hospital encounter of 01/29/12 (from the past 24 hour(s))  PROTIME-INR     Status: Abnormal   Collection Time   02/01/12 12:16 PM      Component Value Range   Prothrombin Time 28.0 (*) 11.6 - 15.2 seconds   INR 2.79 (*) 0.00 - 1.49  CBC     Status: Abnormal   Collection Time   02/01/12 12:16 PM      Component Value Range   WBC 11.6 (*) 4.0 - 10.5 K/uL   RBC 4.44  3.87 - 5.11 MIL/uL   Hemoglobin 11.7 (*) 12.0 - 15.0 g/dL   HCT 53.6  64.4 - 03.4 %   MCV 82.0  78.0 - 100.0 fL   MCH 26.4  26.0 - 34.0 pg   MCHC 32.1  30.0 - 36.0 g/dL   RDW 74.2 (*) 59.5 - 63.8 %   Platelets 200  150 - 400 K/uL  PROTIME-INR     Status: Abnormal   Collection Time   02/02/12  6:18 AM      Component Value Range   Prothrombin Time 28.0 (*) 11.6 - 15.2 seconds   INR 2.79 (*) 0.00 - 1.49  BASIC METABOLIC PANEL     Status: Abnormal   Collection Time   02/02/12  6:18 AM      Component Value Range   Sodium 138  135 - 145 mEq/L   Potassium 4.3  3.5 - 5.1 mEq/L   Chloride 102  96 - 112 mEq/L   CO2 26  19 - 32 mEq/L   Glucose, Bld 129 (*) 70 - 99 mg/dL   BUN 41 (*) 6 - 23 mg/dL   Creatinine, Ser 7.56 (*) 0.50 - 1.10 mg/dL   Calcium 43.3 (*) 8.4 - 10.5 mg/dL   GFR calc non Af Amer 19 (*) >90 mL/min   GFR calc Af Amer 22 (*) >90 mL/min  CBC     Status: Abnormal   Collection Time   02/02/12  6:18 AM      Component Value Range   WBC 8.6  4.0 - 10.5 K/uL   RBC 4.33  3.87 - 5.11 MIL/uL   Hemoglobin 11.4 (*)  12.0 - 15.0 g/dL   HCT 29.5  18.8 - 41.6 %   MCV 83.1  78.0 - 100.0 fL   MCH 26.3  26.0 - 34.0 pg   MCHC 31.7  30.0 - 36.0 g/dL   RDW 60.6 (*) 30.1 - 60.1 %   Platelets 247  150 - 400 K/uL    Intake/Output Summary (Last 24 hours) at 02/02/12 0847 Last data filed at 02/02/12 0600  Gross per 24 hour  Intake    720 ml  Output    300 ml  Net    420 ml  ASSESSMENT AND PLAN:  Acute on chronic diastolic CHF - I agree that this was likely secondary to salt.  I will follow up on the possibility of further atrial fib.  I will continue to educate her.    PAF  - Continue current therapy.    A/CKD, stage 4 - We will follow closely as an outpatient.    HTN -  Her blood pressure is well controlled on the current medications.    Tobacco abuse  - We have talked about this many times.  She says that she only smokes one cigarette per day.  She is advised to quit altogether.   Fayrene Fearing [facility] 02/02/2012 8:47 AM

## 2012-02-02 NOTE — Discharge Summary (Addendum)
Physician Discharge Summary  [name] YQM:[id number] DOB: 15-Jan-1941 DOA: 01/29/2012  PCP: Oliver Barre, MD  Admit date: 01/29/2012 Discharge date: 02/02/2012  Recommendations for Outpatient Follow-up:  1. Follow up PTH results and hypercalcemia -  2. Follow up resolution of c diff  Discharge Diagnoses:  Acute  respiratory failure s/p intubation and mechanical ventilation for 24 hrs Flash pulmonary edema  HYPERTENSION  CHRONIC KIDNEY DISEASE STAGE IV  CARDIOMYOPATHY, IDIOPATHIC HYPERTROPHIC  FIBRILLATION, ATRIAL - s/p DCCV 11/2011 - on Amiodarone and coumadin   Dyslipidemia  Acute on Chronic diastolic heart failure  CAD- non obstructive   Lactic acidosis - resolved   Hyperglycemia  Hypercalcemia - PTH pending -   Clostridium difficile colitis - mild    Discharge Condition: back to baseline   Diet recommendation: low salt   Filed Weights   01/30/12 1300 01/31/12 0435 02/02/12 0700  Weight: 86.4 kg (190 lb 7.6 oz) 84.2 kg (185 lb 10 oz) 82.419 kg (181 lb 11.2 oz)    History of present illness:  71 years old female with PMH relevant for CAD, AMI, A. Fib with RVR, severe HOCM, HTN, CKD, CHF with prior intubations and MRSA infection. Presents with sudden onset of sever SOB. As per th At admission to the ED found to be severely hypertensive. She was intubated for hypoxemic respiratory failure. Chest X ray showed bilateral infiltrates compatible with acute pulmonary edema. The patient was started on NTG drip with good response. At the time of my exam the patient is intubated, synchronous with the ventilator, calm, awake and following commands. Hemodynamically stable, saturating 100% on 100% FiO2.      Hospital Course:  1. Acute hypoxemic respiratory failure due to Acute pulmonary edema in the setting of hypertensive emergency. - patient was intubated in the emergency room on 9/21 and received iv furosemide. She was extubated on 9/22 and remained stable respiratory wise. Empiric  abx were discontinued on 9/23 after 2 days of Vanc and zosyn . After adequate diuresis she is now stable for DC  2. Hx of afib with RVR s/p DCCV in 07/13 - on chronic amiodarone and coumadin  3. Acute on chronic diastolic chf - . C/w BB. Not a candidate for acei due to renal failure. Maintenance dose lasix increased to 40 daily  4. Leukocytosis - ? demargination - Patient has also mentioned diarrhea since coming to the hospital - probably related to zosyn. cdiff was positive as the WBCs normalized and diarrhea was resolving. Plan for 2 weeks of outpatient po vancomycin. Po flagyl could not be chosen due to coumadin interaction.  5. CKD stage 4 - ? Etiology - ? HTN - may benefit from outpatient referral to  nephrology  6. Hypercalcemia - ? Due to CKD, but checked PTH on 9/24. She may have hyperparathyroidism and the renal failure could be secondary to hypercalcemia. PTH pending at discharge. Scheduled for new PCP visit.      Procedures:  intubation  Consultations:  Cardiology   Discharge Exam: Filed Vitals:   02/01/12 2100 02/01/12 2134 02/02/12 0500 02/02/12 0700  BP: 104/58  117/59   Pulse: 59 60 60   Temp: 98.5 F (36.9 C)  98.5 F (36.9 C)   TempSrc: Oral  Oral   Resp:      Height:      Weight:    82.419 kg (181 lb 11.2 oz)  SpO2: 100%  100%     General: axox3 Cardiovascular: RRR, 3/6 systolic murmur Respiratory: CTAB Abdomen  soft, nt   Discharge Instructions      Discharge Orders    Future Orders Please Complete By Expires   Diet - low sodium heart healthy      Increase activity slowly          Medication List     As of 02/02/2012  9:55 AM    STOP taking these medications         ferrous sulfate 325 (65 FE) MG tablet      TAKE these medications         amiodarone 200 MG tablet   Commonly known as: PACERONE   Take 1 tablet (200 mg total) by mouth daily. Take 200mg  daily starting 12/16/11      furosemide 40 MG tablet   Commonly known as: LASIX   Take 1  tablet (40 mg total) by mouth daily.      metoprolol tartrate 25 MG tablet   Commonly known as: LOPRESSOR   Take 1 tablet (25 mg total) by mouth 2 (two) times daily.      metroNIDAZOLE 500 MG tablet   Commonly known as: FLAGYL   Take 1 tablet (500 mg total) by mouth every 8 (eight) hours.      nitroGLYCERIN 0.4 MG SL tablet   Commonly known as: NITROSTAT   Place 0.4 mg under the tongue every 5 (five) minutes as needed. For chest pain      pantoprazole 40 MG tablet   Commonly known as: PROTONIX   Take 40 mg by mouth daily.      rosuvastatin 10 MG tablet   Commonly known as: CRESTOR   Take 10 mg by mouth daily.      warfarin 5 MG tablet   Commonly known as: COUMADIN   Take 1 tablet (5 mg total) by mouth daily. Take 5mg  daily and have your INR checked on Monday 7/15 for further instructions on dosing         Follow-up Information    Schedule an appointment as soon as possible for a visit with Oliver Barre, MD.   Contact information:   [address] [place] [address] [phone]           The results of significant diagnostics from this hospitalization (including imaging, microbiology, ancillary and laboratory) are listed below for reference.    Significant Diagnostic Studies: Dg Chest Port 1 View  01/31/2012  *RADIOLOGY REPORT*  Clinical Data: Pulmonary edema.  Shortness of breath.  PORTABLE CHEST - 1 VIEW  Comparison: 09/22 and 01/29/2012  Findings: Endotracheal tube and NG tube have been removed. Pulmonary edema has resolved on the left with minimal residual haziness in the right perihilar region.  Heart size has diminished. Vascularity is now normal.  IMPRESSION: Almost complete resolution of the pulmonary edema.   Original Report Authenticated By: Gwynn Burly, M.D.    Dg Chest Port 1 View  01/30/2012  *RADIOLOGY REPORT*  Clinical Data: Endotracheal tube,  PORTABLE CHEST - 1 VIEW  Comparison: 01/29/2012  Findings: Cardiomegaly again  noted.  Endotracheal tube in place with tip 3.8 cm above the carina.  NG tube in place.  Mild improvement in aeration.  Residual mild interstitial edema.  Mild basilar atelectasis.  No segmental infiltrate.  IMPRESSION: Endotracheal tube in place with tip 3.8 cm above the carina.  NG tube in place.  Mild improvement in aeration.  Residual mild interstitial edema.  Mild basilar atelectasis.  No segmental infiltrate.  Original Report Authenticated By: Natasha Mead, M.D.    Dg Chest Portable 1 View  01/29/2012  *RADIOLOGY REPORT*  Clinical Data: Shortness of breath  PORTABLE CHEST - 1 VIEW  Comparison: 01/29/2012  Findings: The endotracheal tube tip is above the carina.  There is mild moderate cardiac enlargement.  Interstitial edema pattern has increased from previous exam  IMPRESSION:  1.  ET tube tip is above the carina. 2.  Increase in interstitial edema.   Original Report Authenticated By: Rosealee Albee, M.D.    Dg Chest Portable 1 View  01/29/2012  *RADIOLOGY REPORT*  Clinical Data: Shortness of breath  PORTABLE CHEST - 1 VIEW  Comparison: 11/15/2011  Findings: Moderate cardiac enlargement.  There is moderate diffuse interstitial edema.  Small amount of pleural fluid is identified along the major fissure of the right lung.  Review of the visualized osseous structures is unremarkable.  IMPRESSION:  1.  Cardiac enlargement and moderate pulmonary edema.   Original Report Authenticated By: Rosealee Albee, M.D.     Microbiology: Recent Results (from the past 240 hour(s))  CULTURE, BLOOD (ROUTINE X 2)     Status: Normal (Preliminary result)   Collection Time   01/29/12 10:30 PM      Component Value Range Status Comment   Specimen Description BLOOD RIGHT HAND   Final    Special Requests BOTTLES DRAWN AEROBIC AND ANAEROBIC 5CC   Final    Culture  Setup Time 01/30/2012 11:39   Final    Culture     Final    Value:        BLOOD CULTURE RECEIVED NO GROWTH TO DATE CULTURE WILL BE HELD FOR 5 DAYS BEFORE  ISSUING A FINAL NEGATIVE REPORT   Report Status PENDING   Incomplete   CULTURE, BLOOD (ROUTINE X 2)     Status: Normal (Preliminary result)   Collection Time   01/29/12 10:45 PM      Component Value Range Status Comment   Specimen Description BLOOD RIGHT HAND   Final    Special Requests BOTTLES DRAWN AEROBIC ONLY 10CC   Final    Culture  Setup Time 01/30/2012 11:39   Final    Culture     Final    Value:        BLOOD CULTURE RECEIVED NO GROWTH TO DATE CULTURE WILL BE HELD FOR 5 DAYS BEFORE ISSUING A FINAL NEGATIVE REPORT   Report Status PENDING   Incomplete   URINE CULTURE     Status: Normal   Collection Time   01/29/12 10:48 PM      Component Value Range Status Comment   Specimen Description URINE, RANDOM   Final    Special Requests NONE   Final    Culture  Setup Time 01/30/2012 11:38   Final    Colony Count NO GROWTH   Final    Culture NO GROWTH   Final    Report Status 01/31/2012 FINAL   Final   MRSA PCR SCREENING     Status: Normal   Collection Time   01/30/12  3:37 AM      Component Value Range Status Comment   MRSA by PCR NEGATIVE  NEGATIVE Final   CLOSTRIDIUM DIFFICILE BY PCR     Status: Abnormal   Collection Time   02/02/12  6:57 AM      Component Value Range Status Comment   C difficile by pcr POSITIVE (*) NEGATIVE Final      Labs: Basic Metabolic  Panel:  Lab 02/02/12 0618 02/01/12 4098 01/31/12 0430 01/30/12 0844 01/29/12 2237  NA 138 139 -- 136 134*  K 4.3 4.2 -- 4.2 3.7  CL 102 104 -- 101 99  CO2 26 26 -- 20 17*  GLUCOSE 129* 118* -- 215* 321*  BUN 41* 43* -- 27* 18  CREATININE 2.39* 2.40* -- 2.31* 2.06*  CALCIUM 11.8* 12.1* -- 11.4* 11.8*  MG -- 2.1 2.2 1.8 --  PHOS -- 2.5 3.5 2.4 --   Liver Function Tests:  Lab 01/29/12 2237  AST 28  ALT 17  ALKPHOS 174*  BILITOT 0.2*  PROT 8.3  ALBUMIN 3.8   No results found for this basename: LIPASE:5,AMYLASE:5 in the last 168 hours No results found for this basename: AMMONIA:5 in the last 168 hours CBC:  Lab  02/02/12 0618 02/01/12 1216 01/31/12 0430 01/30/12 0844 01/29/12 2237  WBC 8.6 11.6* 19.0* 17.1* 15.5*  NEUTROABS -- -- -- -- 8.3*  HGB 11.4* 11.7* 11.5* 11.8* 12.6  HCT 36.0 36.4 35.2* 36.3 39.9  MCV 83.1 82.0 80.5 81.6 83.8  PLT 247 200 220 231 299   Cardiac Enzymes:  Lab 01/29/12 2239  CKTOTAL --  CKMB --  CKMBINDEX --  TROPONINI <0.30   BNP: BNP (last 3 results)  Basename 02/01/12 0605 01/29/12 2239 11/15/11 0125  PROBNP 1550.0* 1089.0* 661.9*   CBG:  Lab 01/31/12 1200 01/31/12 0753 01/31/12 0403 01/31/12 0006 01/30/12 2000  GLUCAP 174* 143* 161* 152* 170*    Time coordinating discharge: 50 minutes  Signed:  Chasity Outten  [facility] 02/02/2012, 9:55 AM

## 2012-02-03 ENCOUNTER — Telehealth: Payer: Self-pay | Admitting: Cardiology

## 2012-02-03 NOTE — Telephone Encounter (Signed)
Dr Lonia Blood

## 2012-02-03 NOTE — Telephone Encounter (Signed)
Dr Lonia Blood sent RX in for the treatment of C-diff.   Pt can not afford this.  Dr Lavera Guise will call pt and sent in another RX for pt.  He would like to be paged by the pharmacist tomorrow to discuss needed treatment in relation to her coumadin therapy.  His pager is [phone].

## 2012-02-03 NOTE — Telephone Encounter (Signed)
Pt is on vancomycin and it is to expensive and she wants to know is there something cheaper she can take

## 2012-02-04 NOTE — Telephone Encounter (Signed)
Received call from Dr Lonia Blood that he had called in RX for Flagyl 500 mg q 8 hours for 1 week and that he wanted pt to take coumadin 2.5mg  daily due to being on Flagyl. This nurse called pt and informed her of RX for Flagyl and that she is to take coumadin 2.5mg  daily and needs to be seen in coumadin clinic on Monday and made her appt  For Monday. Pt called back and stated that medication was not at Pharmacy. This nurse called  Pharmacy and again called Dr Lavera Guise and medication is at Pharmacy. This nurse called pt back and informed that Flagyl is at the Pharmacy and again stressed to pt that she needs to get this medication and start on it  and again reminded her to  take  the coumadin 2.5mg  daily as ordered and she verbalized understanding.

## 2012-02-04 NOTE — Telephone Encounter (Signed)
F/u   plz return call to patient regarding medication [phone]

## 2012-02-05 ENCOUNTER — Telehealth: Payer: Self-pay | Admitting: Family Medicine

## 2012-02-05 LAB — CULTURE, BLOOD (ROUTINE X 2)

## 2012-02-05 NOTE — Telephone Encounter (Signed)
I rec call from call a nurse that pt's blood culture came back pos from her hospitalization recently (she has been on vanco and then flagyl for c diff) Prelim report is gram pos rods, and unsure if this could be a contaminant I called the patient and she feels fine - diarrhea is better, no fever-- is back to her usual self I adv her to call asap if any fever or other symptoms and to continue her abx  She will have her protime done soon and will est with Dr Jonny Ruiz in November  I will forward this message to him and her cardiologist  Final report on blood culture is still pending

## 2012-02-07 ENCOUNTER — Ambulatory Visit (INDEPENDENT_AMBULATORY_CARE_PROVIDER_SITE_OTHER): Payer: Medicare Other | Admitting: *Deleted

## 2012-02-07 DIAGNOSIS — I4891 Unspecified atrial fibrillation: Secondary | ICD-10-CM

## 2012-02-07 LAB — CULTURE, BLOOD (ROUTINE X 2)

## 2012-02-12 ENCOUNTER — Telehealth: Payer: Self-pay | Admitting: Family Medicine

## 2012-02-12 NOTE — Telephone Encounter (Signed)
I'm not sure if this got to Dr Jonny Ruiz or not, I will send it again

## 2012-02-12 NOTE — Telephone Encounter (Signed)
Noted, thanks!

## 2012-02-14 ENCOUNTER — Other Ambulatory Visit: Payer: Self-pay | Admitting: Cardiology

## 2012-02-15 ENCOUNTER — Telehealth: Payer: Self-pay | Admitting: Cardiology

## 2012-02-15 ENCOUNTER — Telehealth: Payer: Self-pay | Admitting: *Deleted

## 2012-02-15 NOTE — Telephone Encounter (Signed)
Called orders to [place] at  [facility] for INR check 02/17/12, I had received a call from pt stating they were seeing her.

## 2012-02-17 ENCOUNTER — Ambulatory Visit: Payer: Self-pay | Admitting: Internal Medicine

## 2012-02-17 DIAGNOSIS — I4891 Unspecified atrial fibrillation: Secondary | ICD-10-CM

## 2012-02-17 LAB — POCT INR: INR: 8

## 2012-02-18 ENCOUNTER — Other Ambulatory Visit (HOSPITAL_COMMUNITY): Payer: Self-pay | Admitting: Cardiology

## 2012-02-18 NOTE — Progress Notes (Signed)
Spoke with Morrie Sheldon, RN at [facility] she states Loney Loh has lab, but has not been ran STAT yet.  She states if lab is critical it will be called to [facility] triage on call line and it will be then forwarded to [facility] on call MD/PA.

## 2012-02-21 ENCOUNTER — Ambulatory Visit: Payer: Self-pay | Admitting: Cardiovascular Disease

## 2012-02-21 DIAGNOSIS — I4891 Unspecified atrial fibrillation: Secondary | ICD-10-CM

## 2012-02-21 LAB — POCT INR: INR: 4.1

## 2012-02-25 ENCOUNTER — Telehealth: Payer: Self-pay | Admitting: *Deleted

## 2012-02-28 ENCOUNTER — Ambulatory Visit: Payer: Self-pay | Admitting: Cardiology

## 2012-02-28 DIAGNOSIS — I4891 Unspecified atrial fibrillation: Secondary | ICD-10-CM

## 2012-02-28 LAB — POCT INR: INR: 2.2

## 2012-02-29 ENCOUNTER — Other Ambulatory Visit (HOSPITAL_COMMUNITY): Payer: Self-pay | Admitting: Internal Medicine

## 2012-03-02 ENCOUNTER — Encounter: Payer: Self-pay | Admitting: Cardiology

## 2012-03-02 ENCOUNTER — Ambulatory Visit (INDEPENDENT_AMBULATORY_CARE_PROVIDER_SITE_OTHER): Payer: Medicare Other | Admitting: Cardiology

## 2012-03-02 VITALS — BP 145/88 | HR 55 | Ht 63.0 in | Wt 190.4 lb

## 2012-03-02 DIAGNOSIS — I2510 Atherosclerotic heart disease of native coronary artery without angina pectoris: Secondary | ICD-10-CM

## 2012-03-02 DIAGNOSIS — I4891 Unspecified atrial fibrillation: Secondary | ICD-10-CM

## 2012-03-02 DIAGNOSIS — I428 Other cardiomyopathies: Secondary | ICD-10-CM

## 2012-03-02 DIAGNOSIS — F17200 Nicotine dependence, unspecified, uncomplicated: Secondary | ICD-10-CM

## 2012-03-02 NOTE — Progress Notes (Signed)
HPI The patient presents for followup of acute respiratory failure and hospitalization requiring brief intubation. This was thought to be related to an exacerbation of salt. Since that visit she has done well. She actually quit smoking. She is not having any new shortness of breath and denies any PND or orthopnea. She's not having any chest pressure, neck or arm discomfort. She's not describing palpitations, presyncope or syncope. She has had no weight gain or edema. She has a bit of an unsteady gait and has borrowed a friend's walker and uses this.   No Known Allergies  Current Outpatient Prescriptions  Medication Sig Dispense Refill  . furosemide (LASIX) 40 MG tablet Take 1 tablet (40 mg total) by mouth daily.  30 tablet  0  . metoprolol tartrate (LOPRESSOR) 25 MG tablet Take 1 tablet (25 mg total) by mouth 2 (two) times daily.  60 tablet  6  . nitroGLYCERIN (NITROSTAT) 0.4 MG SL tablet Place 0.4 mg under the tongue every 5 (five) minutes as needed. For chest pain      . pantoprazole (PROTONIX) 40 MG tablet Take 40 mg by mouth daily.      . rosuvastatin (CRESTOR) 10 MG tablet Take 10 mg by mouth daily.      Marland Kitchen warfarin (COUMADIN) 5 MG tablet Take as directed by anticoagulation clinic  30 tablet  3  . amiodarone (PACERONE) 200 MG tablet Take 1 tablet (200 mg total) by mouth daily. Take 200mg  daily starting 12/16/11  30 tablet  6    Past Medical History  Diagnosis Date  . CAD (coronary artery disease)     cath 11/11: LAD 40%, mid-dist 25-30%; prox CFX 30%, mid 40%; OM 50%; PDA 50%; PLV 50%;  EF 65%  . Acute myocardial infarction     in setting of AFib with RVR 03/2010  . Atrial fibrillation     DCCV 11/17/11; coumadin & amiodarone  . HOCM (hypertrophic obstructive cardiomyopathy)     Echo 11/2011:severe LVH, EF 65-70%, mid-cavity gradient up to  . Hypertension   . Tobacco abuse   . Chronic kidney disease     Stage 4  . CHF (congestive heart failure)     preserved LVF  . Iron  deficiency anemia   . Third degree uterine prolapse   . Hx MRSA infection     Past Surgical History  Procedure Date  . None   . Cardioversion 11/17/2011    Procedure: CARDIOVERSION;  Surgeon: Hillis Range, MD;  Location: [facility] OR;  Service: Cardiovascular;  Laterality: N/A;    ROS:  She describes some vaginal spotting.  Otherwise, as stated in the HPI and negative for all other systems.  PHYSICAL EXAM BP 145/88  Pulse 55  Ht 5\' 3"  (1.6 m)  Wt 190 lb 6.4 oz (86.365 kg)  BMI 33.73 kg/m2 GENERAL:  Well appearing NECK:  No jugular venous distention, waveform within normal limits, carotid upstroke brisk and symmetric, no bruits, no thyromegaly LUNGS:  Clear to auscultation bilaterally BACK:  No CVA tenderness CHEST:  Unremarkable HEART:  PMI not displaced or sustained,S1 and S2 within normal limits, no S3, postive S4 no clicks, no rubs, slight apical systolic murmur increases with the strain phase of Valsalva ABD:  Flat, positive bowel sounds normal in frequency in pitch, no bruits, no rebound, no guarding, no midline pulsatile mass, no hepatomegaly, no splenomegaly, obese EXT:  2 plus pulses throughout, no edema, no cyanosis no clubbing   ASSESSMENT AND PLAN  CAD -  She had nonobstructive disease in the previous catheterization. The patient has no new sypmtoms. No further cardiovascular testing is indicated. We will continue with aggressive risk reduction and meds as listed.   Chronic diastolic heart failure -  She seems to be euvolemic. At this point, no change in therapy is indicated. We went over in detail salt restriction which I think brought on her last acute event.  HYPERTENSION -  The blood pressure is slightly elevated today but she didn't take her medicines this morning.  No change in medications is indicated. We will continue with therapeutic lifestyle changes (TLC).   TOBACCO ABUSE -  She quit smoking! I made a big fuss over this.  FIBRILLATION, ATRIAL -  She has had  no problems with amiodarone and tolerates anticoagulation. No change in therapy is indicated. She seems to be maintaining sinus rhythm.  CKD - I will check a BMET today.

## 2012-03-02 NOTE — Patient Instructions (Addendum)
The current medical regimen is effective;  continue present plan and medications.  Please have blood work today (BMP)  Follow up in 6 months with Dr Hochrein.  You will receive a letter in the mail 2 months before you are due.  Please call us when you receive this letter to schedule your follow up appointment.  

## 2012-03-03 LAB — BASIC METABOLIC PANEL WITH GFR
Chloride: 105 mEq/L (ref 96–112)
GFR, Est African American: 32 mL/min — ABNORMAL LOW
GFR, Est Non African American: 27 mL/min — ABNORMAL LOW
Potassium: 4.4 mEq/L (ref 3.5–5.3)
Sodium: 139 mEq/L (ref 135–145)

## 2012-03-03 NOTE — Telephone Encounter (Signed)
Plz return call to pt at hm# to discuss, lasix medication.

## 2012-03-03 NOTE — Telephone Encounter (Signed)
Pt needs refills because none were given at time of discharge.

## 2012-03-06 ENCOUNTER — Ambulatory Visit: Payer: Self-pay | Admitting: Internal Medicine

## 2012-03-06 DIAGNOSIS — I4891 Unspecified atrial fibrillation: Secondary | ICD-10-CM

## 2012-03-06 LAB — POCT INR: INR: 2.7

## 2012-03-11 ENCOUNTER — Inpatient Hospital Stay (HOSPITAL_COMMUNITY)
Admission: EM | Admit: 2012-03-11 | Discharge: 2012-03-17 | DRG: 208 | Disposition: A | Discharge status: Home / Self Care | Payer: Medicare Other | Attending: Emergency Medicine | Admitting: Emergency Medicine

## 2012-03-11 ENCOUNTER — Inpatient Hospital Stay (HOSPITAL_COMMUNITY): Payer: Medicare Other

## 2012-03-11 ENCOUNTER — Emergency Department (HOSPITAL_COMMUNITY): Payer: Medicare Other

## 2012-03-11 ENCOUNTER — Encounter (HOSPITAL_COMMUNITY): Payer: Self-pay | Admitting: *Deleted

## 2012-03-11 DIAGNOSIS — I421 Obstructive hypertrophic cardiomyopathy: Secondary | ICD-10-CM | POA: Diagnosis present

## 2012-03-11 DIAGNOSIS — I509 Heart failure, unspecified: Secondary | ICD-10-CM | POA: Diagnosis present

## 2012-03-11 DIAGNOSIS — I5033 Acute on chronic diastolic (congestive) heart failure: Secondary | ICD-10-CM | POA: Diagnosis present

## 2012-03-11 DIAGNOSIS — D509 Iron deficiency anemia, unspecified: Secondary | ICD-10-CM | POA: Diagnosis present

## 2012-03-11 DIAGNOSIS — I498 Other specified cardiac arrhythmias: Secondary | ICD-10-CM | POA: Diagnosis present

## 2012-03-11 DIAGNOSIS — E876 Hypokalemia: Secondary | ICD-10-CM | POA: Diagnosis not present

## 2012-03-11 DIAGNOSIS — J811 Chronic pulmonary edema: Secondary | ICD-10-CM

## 2012-03-11 DIAGNOSIS — Z0000 Encounter for general adult medical examination without abnormal findings: Secondary | ICD-10-CM

## 2012-03-11 DIAGNOSIS — Z7901 Long term (current) use of anticoagulants: Secondary | ICD-10-CM

## 2012-03-11 DIAGNOSIS — I252 Old myocardial infarction: Secondary | ICD-10-CM

## 2012-03-11 DIAGNOSIS — G9340 Encephalopathy, unspecified: Secondary | ICD-10-CM | POA: Diagnosis present

## 2012-03-11 DIAGNOSIS — I129 Hypertensive chronic kidney disease with stage 1 through stage 4 chronic kidney disease, or unspecified chronic kidney disease: Secondary | ICD-10-CM | POA: Diagnosis present

## 2012-03-11 DIAGNOSIS — A0472 Enterocolitis due to Clostridium difficile, not specified as recurrent: Secondary | ICD-10-CM

## 2012-03-11 DIAGNOSIS — E119 Type 2 diabetes mellitus without complications: Secondary | ICD-10-CM | POA: Diagnosis present

## 2012-03-11 DIAGNOSIS — I161 Hypertensive emergency: Secondary | ICD-10-CM

## 2012-03-11 DIAGNOSIS — I5032 Chronic diastolic (congestive) heart failure: Secondary | ICD-10-CM

## 2012-03-11 DIAGNOSIS — I4891 Unspecified atrial fibrillation: Secondary | ICD-10-CM | POA: Diagnosis present

## 2012-03-11 DIAGNOSIS — I2510 Atherosclerotic heart disease of native coronary artery without angina pectoris: Secondary | ICD-10-CM | POA: Diagnosis present

## 2012-03-11 DIAGNOSIS — Z8614 Personal history of Methicillin resistant Staphylococcus aureus infection: Secondary | ICD-10-CM

## 2012-03-11 DIAGNOSIS — N289 Disorder of kidney and ureter, unspecified: Secondary | ICD-10-CM

## 2012-03-11 DIAGNOSIS — Z87891 Personal history of nicotine dependence: Secondary | ICD-10-CM

## 2012-03-11 DIAGNOSIS — R0602 Shortness of breath: Secondary | ICD-10-CM

## 2012-03-11 DIAGNOSIS — R0902 Hypoxemia: Secondary | ICD-10-CM

## 2012-03-11 DIAGNOSIS — F17200 Nicotine dependence, unspecified, uncomplicated: Secondary | ICD-10-CM

## 2012-03-11 DIAGNOSIS — N189 Chronic kidney disease, unspecified: Secondary | ICD-10-CM

## 2012-03-11 DIAGNOSIS — I959 Hypotension, unspecified: Secondary | ICD-10-CM | POA: Diagnosis present

## 2012-03-11 DIAGNOSIS — A4901 Methicillin susceptible Staphylococcus aureus infection, unspecified site: Secondary | ICD-10-CM | POA: Diagnosis not present

## 2012-03-11 DIAGNOSIS — I10 Essential (primary) hypertension: Secondary | ICD-10-CM

## 2012-03-11 DIAGNOSIS — E785 Hyperlipidemia, unspecified: Secondary | ICD-10-CM

## 2012-03-11 DIAGNOSIS — I428 Other cardiomyopathies: Secondary | ICD-10-CM | POA: Diagnosis present

## 2012-03-11 DIAGNOSIS — R9431 Abnormal electrocardiogram [ECG] [EKG]: Secondary | ICD-10-CM

## 2012-03-11 DIAGNOSIS — J9690 Respiratory failure, unspecified, unspecified whether with hypoxia or hypercapnia: Secondary | ICD-10-CM

## 2012-03-11 DIAGNOSIS — J9620 Acute and chronic respiratory failure, unspecified whether with hypoxia or hypercapnia: Principal | ICD-10-CM | POA: Diagnosis present

## 2012-03-11 DIAGNOSIS — R739 Hyperglycemia, unspecified: Secondary | ICD-10-CM

## 2012-03-11 DIAGNOSIS — J810 Acute pulmonary edema: Secondary | ICD-10-CM

## 2012-03-11 DIAGNOSIS — N185 Chronic kidney disease, stage 5: Secondary | ICD-10-CM

## 2012-03-11 DIAGNOSIS — J9601 Acute respiratory failure with hypoxia: Secondary | ICD-10-CM

## 2012-03-11 DIAGNOSIS — N184 Chronic kidney disease, stage 4 (severe): Secondary | ICD-10-CM | POA: Diagnosis present

## 2012-03-11 DIAGNOSIS — E872 Acidosis: Secondary | ICD-10-CM

## 2012-03-11 DIAGNOSIS — R5381 Other malaise: Secondary | ICD-10-CM

## 2012-03-11 DIAGNOSIS — N179 Acute kidney failure, unspecified: Secondary | ICD-10-CM | POA: Diagnosis present

## 2012-03-11 DIAGNOSIS — Z79899 Other long term (current) drug therapy: Secondary | ICD-10-CM

## 2012-03-11 DIAGNOSIS — J40 Bronchitis, not specified as acute or chronic: Secondary | ICD-10-CM | POA: Diagnosis not present

## 2012-03-11 LAB — CBC WITH DIFFERENTIAL/PLATELET
Eosinophils Relative: 1 % (ref 0–5)
Hemoglobin: 12.6 g/dL (ref 12.0–15.0)
Lymphocytes Relative: 37 % (ref 12–46)
MCH: 27.1 pg (ref 26.0–34.0)
Monocytes Absolute: 0.4 10*3/uL (ref 0.1–1.0)
Monocytes Relative: 4 % (ref 3–12)
Neutrophils Relative %: 58 % (ref 43–77)
Platelets: 156 10*3/uL (ref 150–400)
RBC: 4.65 MIL/uL (ref 3.87–5.11)
Smear Review: ADEQUATE
WBC: 8.9 10*3/uL (ref 4.0–10.5)

## 2012-03-11 LAB — POCT I-STAT 3, ART BLOOD GAS (G3+)
Acid-base deficit: 7 mmol/L — ABNORMAL HIGH (ref 0.0–2.0)
Bicarbonate: 23.7 mEq/L (ref 20.0–24.0)
TCO2: 26 mmol/L (ref 0–100)

## 2012-03-11 LAB — BASIC METABOLIC PANEL
CO2: 18 mEq/L — ABNORMAL LOW (ref 19–32)
Calcium: 11.3 mg/dL — ABNORMAL HIGH (ref 8.4–10.5)
Potassium: 4.2 mEq/L (ref 3.5–5.1)
Sodium: 134 mEq/L — ABNORMAL LOW (ref 135–145)

## 2012-03-11 LAB — PRO B NATRIURETIC PEPTIDE: Pro B Natriuretic peptide (BNP): 2010 pg/mL — ABNORMAL HIGH (ref 0–125)

## 2012-03-11 LAB — URINE MICROSCOPIC-ADD ON

## 2012-03-11 LAB — POCT I-STAT, CHEM 8
BUN: 26 mg/dL — ABNORMAL HIGH (ref 6–23)
Calcium, Ion: 1.48 mmol/L — ABNORMAL HIGH (ref 1.13–1.30)
Creatinine, Ser: 1.9 mg/dL — ABNORMAL HIGH (ref 0.50–1.10)
Glucose, Bld: 294 mg/dL — ABNORMAL HIGH (ref 70–99)
TCO2: 22 mmol/L (ref 0–100)

## 2012-03-11 LAB — URINALYSIS, ROUTINE W REFLEX MICROSCOPIC
Glucose, UA: 100 mg/dL — AB
Ketones, ur: NEGATIVE mg/dL
Protein, ur: 100 mg/dL — AB

## 2012-03-11 LAB — PROTIME-INR
INR: 1.94 — ABNORMAL HIGH (ref 0.00–1.49)
Prothrombin Time: 21.4 seconds — ABNORMAL HIGH (ref 11.6–15.2)

## 2012-03-11 LAB — POCT I-STAT TROPONIN I

## 2012-03-11 MED ORDER — PANTOPRAZOLE SODIUM 40 MG PO TBEC: 40.0000 mg | DELAYED_RELEASE_TABLET | Freq: Every day | ORAL | Status: DC

## 2012-03-11 MED ORDER — DOPAMINE-DEXTROSE 3.2-5 MG/ML-% IV SOLN
2.0000 ug/kg/min | Freq: Once | INTRAVENOUS | Status: AC
  Administered 2012-03-11: 5 ug/kg/min via INTRAVENOUS

## 2012-03-11 MED ORDER — WARFARIN SODIUM 2.5 MG PO TABS
2.5000 mg | ORAL_TABLET | ORAL | Status: DC
  Administered 2012-03-13: 2.5 mg via ORAL
  Filled 2012-03-11: qty 1

## 2012-03-11 MED ORDER — ATORVASTATIN CALCIUM 20 MG PO TABS
20.0000 mg | ORAL_TABLET | Freq: Every day | ORAL | Status: DC
  Administered 2012-03-12 – 2012-03-16 (×5): 20 mg via ORAL
  Filled 2012-03-11 (×6): qty 1

## 2012-03-11 MED ORDER — PROPOFOL 10 MG/ML IV EMUL: 5.0000 ug/kg/min | INTRAVENOUS | Status: DC

## 2012-03-11 MED ORDER — SODIUM CHLORIDE 0.9 % IV SOLN
25.0000 ug/h | INTRAVENOUS | Status: DC
  Administered 2012-03-11: 50 ug/h via INTRAVENOUS
  Filled 2012-03-11: qty 50

## 2012-03-11 MED ORDER — ASPIRIN 81 MG PO CHEW: 324.0000 mg | CHEWABLE_TABLET | ORAL | Status: AC

## 2012-03-11 MED ORDER — WARFARIN SODIUM 2.5 MG PO TABS: 2.5000 mg | ORAL_TABLET | Freq: Every day | ORAL | Status: DC

## 2012-03-11 MED ORDER — PROPOFOL 10 MG/ML IV EMUL
5.0000 ug/kg/min | Freq: Once | INTRAVENOUS | Status: DC
  Administered 2012-03-11: 20 ug/kg/min via INTRAVENOUS

## 2012-03-11 MED ORDER — PROPOFOL 10 MG/ML IV EMUL
INTRAVENOUS | Status: AC
  Administered 2012-03-11: 20 ug/kg/min via INTRAVENOUS
  Filled 2012-03-11: qty 100

## 2012-03-11 MED ORDER — ROCURONIUM BROMIDE 50 MG/5ML IV SOLN
INTRAVENOUS | Status: AC | PRN
  Administered 2012-03-11: 80 mg via INTRAVENOUS

## 2012-03-11 MED ORDER — MIDAZOLAM BOLUS VIA INFUSION
1.0000 mg | INTRAVENOUS | Status: DC | PRN
  Administered 2012-03-11: 2 mg via INTRAVENOUS
  Filled 2012-03-11: qty 2

## 2012-03-11 MED ORDER — PANTOPRAZOLE SODIUM 40 MG IV SOLR
40.0000 mg | INTRAVENOUS | Status: DC
  Administered 2012-03-12 – 2012-03-13 (×3): 40 mg via INTRAVENOUS
  Filled 2012-03-11 (×6): qty 40

## 2012-03-11 MED ORDER — AMIODARONE HCL 200 MG PO TABS
200.0000 mg | ORAL_TABLET | Freq: Every day | ORAL | Status: DC
  Administered 2012-03-11: 200 mg via ORAL
  Filled 2012-03-11 (×3): qty 1

## 2012-03-11 MED ORDER — METOPROLOL TARTRATE 25 MG PO TABS
25.0000 mg | ORAL_TABLET | Freq: Two times a day (BID) | ORAL | Status: DC
  Filled 2012-03-11 (×5): qty 1

## 2012-03-11 MED ORDER — CALCIUM GLUCONATE 10 % IV SOLN
1.0000 g | INTRAVENOUS | Status: DC
  Filled 2012-03-11: qty 10

## 2012-03-11 MED ORDER — IPRATROPIUM-ALBUTEROL 18-103 MCG/ACT IN AERO
4.0000 | INHALATION_SPRAY | RESPIRATORY_TRACT | Status: DC
  Administered 2012-03-11 – 2012-03-13 (×9): 4 via RESPIRATORY_TRACT
  Filled 2012-03-11: qty 14.7

## 2012-03-11 MED ORDER — ASPIRIN 300 MG RE SUPP: 300.0000 mg | RECTAL | Status: AC

## 2012-03-11 MED ORDER — NITROGLYCERIN IN D5W 200-5 MCG/ML-% IV SOLN
2.0000 ug/min | Freq: Once | INTRAVENOUS | Status: AC
  Administered 2012-03-11: 5 ug/min via INTRAVENOUS
  Filled 2012-03-11: qty 250

## 2012-03-11 MED ORDER — WARFARIN - PHYSICIAN DOSING INPATIENT: Freq: Every day | Status: DC

## 2012-03-11 MED ORDER — ATROPINE SULFATE 1 MG/ML IJ SOLN
INTRAMUSCULAR | Status: DC | PRN
  Administered 2012-03-11: 1 mg via INTRAVENOUS

## 2012-03-11 MED ORDER — SODIUM CHLORIDE 0.9 % IV SOLN
1.0000 mg/h | INTRAVENOUS | Status: DC
  Administered 2012-03-11: 2 mg/h via INTRAVENOUS
  Filled 2012-03-11 (×2): qty 10

## 2012-03-11 MED ORDER — WARFARIN SODIUM 5 MG PO TABS
5.0000 mg | ORAL_TABLET | ORAL | Status: AC
  Administered 2012-03-12: 5 mg via ORAL
  Filled 2012-03-11: qty 1

## 2012-03-11 MED ORDER — FENTANYL BOLUS VIA INFUSION
25.0000 ug | Freq: Four times a day (QID) | INTRAVENOUS | Status: DC | PRN
  Administered 2012-03-11: 50 ug via INTRAVENOUS
  Filled 2012-03-11: qty 100

## 2012-03-11 MED ORDER — DOPAMINE-DEXTROSE 3.2-5 MG/ML-% IV SOLN
INTRAVENOUS | Status: AC
  Administered 2012-03-11: 5 ug/kg/min via INTRAVENOUS
  Filled 2012-03-11: qty 250

## 2012-03-11 MED ORDER — SODIUM CHLORIDE 0.9 % IV SOLN: 250.0000 mL | INTRAVENOUS | Status: DC | PRN

## 2012-03-11 MED ORDER — FUROSEMIDE 10 MG/ML IJ SOLN
80.0000 mg | Freq: Once | INTRAMUSCULAR | Status: AC
  Administered 2012-03-11: 80 mg via INTRAVENOUS
  Filled 2012-03-11: qty 8

## 2012-03-11 MED ORDER — ETOMIDATE 2 MG/ML IV SOLN
INTRAVENOUS | Status: AC | PRN
  Administered 2012-03-11: 20 mg via INTRAVENOUS

## 2012-03-11 NOTE — ED Notes (Signed)
Xray at bedside for portable chest 

## 2012-03-11 NOTE — ED Notes (Signed)
Per EMS: called out for respiratory distress, pt hypertensive.  Assisted with ventilations, initial sat in mid 70's.  Speaking on EMS arrival, non verbal on arrival to ED.

## 2012-03-11 NOTE — H&P (Signed)
Name: [name] MRN: [id number] DOB: Sep 23, 1940    LOS: 1  Referring Provider:  ER physician Reason for Referral:  Hypoxic respiratory failure  PULMONARY / CRITICAL CARE MEDICINE  HPI:  [name] is a 71 y/o woman with past medical history of diastolic heart failure and HOCM who has required intubation in the past past for pulmonary edema.  Her daughter reports that she has been more fatigued over the last 2 days than normal.  This evening she was with her mother and noticed that she was breathing a little more heavily than normal but did not seem to be in any acute distress, however an hour later she was having significant trouble breathing and EMS was called.  When she arrived in the emergency room per nursing report she was non-responsive and requiring bag mask ventilation, her blood pressure at that time was 220 systolic.  She was intubated and started on a nitro drip as well as propofol for sedation.  She had improvement in her saturations but then became hypotensive and bradycardic, her nitro drip a propofol were held with improvement in her blood pressure. She was admitted to the ICU service. According to her daughter she has not had any fever, cough, nausea, vomiting, diarrhea, congestion, rhinorrhea or lightheadedness or syncope.   [name] does have a recent history of a similar event for which she was intubated.  This was felt to be due to increased salt intake.  Past Medical History  Diagnosis Date  . CAD (coronary artery disease)     cath 11/11: LAD 40%, mid-dist 25-30%; prox CFX 30%, mid 40%; OM 50%; PDA 50%; PLV 50%;  EF 65%  . Acute myocardial infarction     in setting of AFib with RVR 03/2010  . Atrial fibrillation     DCCV 11/17/11; coumadin & amiodarone  . HOCM (hypertrophic obstructive cardiomyopathy)     Echo 11/2011:severe LVH, EF 65-70%, mid-cavity gradient up to  . Hypertension   . Tobacco abuse   . Chronic kidney disease     Stage 4  . CHF (congestive  heart failure)     preserved LVF  . Iron deficiency anemia   . Third degree uterine prolapse   . Hx MRSA infection    Past Surgical History  Procedure Date  . None   . Cardioversion 11/17/2011    Procedure: CARDIOVERSION;  Surgeon: Hillis Range, MD;  Location: [facility] OR;  Service: Cardiovascular;  Laterality: N/A;   Prior to Admission medications   Medication Sig Start Date End Date Taking? Authorizing Provider  amiodarone (PACERONE) 200 MG tablet Take 200 mg by mouth daily. Take 200mg  daily starting 12/16/11 12/16/11 12/15/12 Yes Jessica A Hope, PA-C  furosemide (LASIX) 40 MG tablet Take 40 mg by mouth daily. 02/29/12  Yes Rollene Rotunda, MD  metoprolol tartrate (LOPRESSOR) 25 MG tablet Take 25 mg by mouth 2 (two) times daily. 11/18/11 11/17/12 Yes Jessica A Hope, PA-C  nitroGLYCERIN (NITROSTAT) 0.4 MG SL tablet Place 0.4 mg under the tongue every 5 (five) minutes as needed. For chest pain   Yes Historical Provider, MD  pantoprazole (PROTONIX) 40 MG tablet Take 40 mg by mouth daily. 02/04/11  Yes Rollene Rotunda, MD  rosuvastatin (CRESTOR) 10 MG tablet Take 10 mg by mouth at bedtime.    Yes Historical Provider, MD  warfarin (COUMADIN) 5 MG tablet Take 2.5-5 mg by mouth daily. 2.5 mg Monday, Wednesday, Friday; 5mg  Tuesday, Thursday, Saturday, Sunday   Yes Historical Provider, MD  Allergies No Known Allergies  Family History Family History  Problem Relation Age of Onset  . Coronary artery disease Neg Hx   . Atrial fibrillation Neg Hx    Social History  reports that she quit smoking about 5 weeks ago. Her smoking use included Cigarettes. She has a 7.5 pack-year smoking history. She has never used smokeless tobacco. She reports that she does not drink alcohol or use illicit drugs.  Review Of Systems:  All systems were reviewed and were negative except as stated in the HPI  Brief patient description:  71 y/o with HOCM admitted with respiratory failure secondary to pulmonary edema  Events Since  Admission: Intubated, central line placed  Current Status: Critical  Vital Signs: Temp:  [96.8 F (36 C)-100.2 F (37.9 C)] 99.6 F (37.6 C) (11/03 0404) Pulse Rate:  [44-110] 80  (11/03 0600) Resp:  [12-24] 22  (11/03 0600) BP: (39-226)/(17-144) 116/68 mmHg (11/03 0600) SpO2:  [86 %-100 %] 98 % (11/03 0600) Arterial Line BP: (104-156)/(51-73) 144/73 mmHg (11/03 0600) FiO2 (%):  [60 %-100 %] 60 % (11/03 0600) Weight:  [80 kg (176 lb 5.9 oz)-88.6 kg (195 lb 5.2 oz)] 88.6 kg (195 lb 5.2 oz) (11/03 0000)  Physical Examination: General:  Elderly woman laying on stretcher, intubated Neuro:  Intubated and sedated HEENT:  PERRL, EOMI, ETT in place Neck:  Supple, no masses Cardiovascular:  II/VI systolic murmur at LUSB Lungs:  Clear anteriorly, good air movement,  Abdomen:  Soft, NTND, +BS Musculoskeletal:  No joint abnormalities, 1+ edema Skin:  No rash, no bruising, no lesions  Principal Problem:  *Acute respiratory failure with hypoxia   ASSESSMENT AND PLAN  PULMONARY  Lab 03/12/12 0433 03/11/12 1957  PHART 7.340* 7.126*  PCO2ART 41.5 71.9*  PO2ART 96.0 60.0*  HCO3 21.8 23.7  O2SAT 96.9 80.0   Ventilator Settings: Vent Mode:  [-] PRVC FiO2 (%):  [60 %-100 %] 60 % Set Rate:  [16 bmp] 16 bmp Vt Set:  [500 mL] 500 mL PEEP:  [5 cmH20-10 cmH20] 10 cmH20 Plateau Pressure:  [20 cmH20-23 cmH20] 23 cmH20 CXR:  Diffuse bilateral infiltrates consistent with pulmonary edema ETT:  2.8 cm above carina  A:  Intubated for hypoxic respiratory failure associated with pulmonary edema P:   Currently on PEEP of 10, 8cc/kg Need to watch closely for auto peep as pt is very preload dependent but also responds very will to PEEP and recruitment maneuvers for oxygenation Wean ventilatory as possible VAP prevention bundle ordered Albuterol/Ippratroprim Q4.  CARDIOVASCULAR  Lab 03/11/12 1931  TROPONINI --  LATICACIDVEN --  PROBNP 2010.0*   ECG:  Sinus bradycardia with ST  depressions Lines: right IJ placed 11/2  A: Hypertensive emergency, HOCM, Diastolic HF, AFIB P:  Initially started on nitro drip for hypertension however shortly after intubation pt became hypotensive, suspect that she is very pre-load dependent and combination of positive pressure ventilation and vasodilation with propofol caused hypotension.  Continue amiodarone - pt has been bradycardic so rate control not a problem at this time Hold home metoprolol while hypotensive Briefly started on dopamine for hypotension - will use this as needed to maintain blood pressure. Gentle diuresis Continue warfarin for anticoagulation for now - if needing procedures will need to convert to heparin drip.   RENAL  Lab 03/12/12 0400 03/11/12 2010 03/11/12 1931  NA 139 138 134*  K 3.2* 4.3 --  CL 104 106 100  CO2 24 -- 18*  BUN 22 26* 18  CREATININE 1.98*  1.90* 1.83*  CALCIUM 10.9* -- 11.3*  MG 1.7 -- --  PHOS 2.8 -- --   Intake/Output      11/02 0701 - 11/03 0700   I.V. (mL/kg) 45 (0.5)   IV Piggyback 10   Total Intake(mL/kg) 55 (0.6)   Urine (mL/kg/hr) 580 (0.3)   Total Output 580   Net -525        Foley:  Placed 11/2   A:  Acute on chronic renal failure P:   Gentle diuresis with lasix Continue to monitor output Renally dose all medications.   GASTROINTESTINAL No results found for this basename: AST:5,ALT:5,ALKPHOS:5,BILITOT:5,PROT:5,ALBUMIN:5 in the last 168 hours  A:  At risk for stress ulcer P:   NPO for now OG tube in place GI prophylaxis with PPI  HEMATOLOGIC  Lab 03/12/12 0400 03/11/12 2010 03/11/12 1931 03/06/12  HGB 11.4* 14.6 12.6 --  HCT 35.8* 43.0 39.5 --  PLT 241 -- 156 --  INR -- -- 1.94* 2.7  APTT -- -- -- --   A:  Anitcoagulated for AFIB P:  Continue warfarin for now target INR 2-3.  INFECTIOUS  Lab 03/12/12 0400 03/11/12 1931  WBC 17.8* 8.9  PROCALCITON -- --   Cultures: none Antibiotics: None   A:  At risk for ventilator associated  pneumonia  P:   VAP prevention bundle ordered. Monitor for signs and symptoms of infection Pan culture for temp>38.5C  ENDOCRINE  Lab 03/11/12 1914  GLUCAP 183*   A:  Hyperglycemia   P:   Monitor glucose on BMP if persistently >180 start SSI  NEUROLOGIC  A:  Sedated for intubation P:   Pt did not tolerate propofol secondary to bradycardia and hypotension Will sedate with fentanyl and versed, RASS goal -1-0.  BEST PRACTICE / DISPOSITION Level of Care:  ICU Primary Service:  PCCM Consultants:  cardiology Code Status:  full Diet:  NPO DVT Px:  warfarin GI Px:  PPI Skin Integrity:  good Social / Family:  Daughter at bedside.  Carolan Clines., M.D. Pulmonary and Critical Care Medicine [facility] Pager: [phone]  03/11/2012, 10:45 PM

## 2012-03-11 NOTE — ED Notes (Signed)
EDP at bedside, BP dropped to 74/52.  Nitro stopped per EDP order.

## 2012-03-11 NOTE — ED Notes (Signed)
[phone] [name], daughter, would like to be called with any changes.

## 2012-03-11 NOTE — ED Provider Notes (Addendum)
Pt seen on arrival with resident She is in resp distress, suspect acute pulmonary edema Pt intubated by resident, I present for entire procedure Wide complex bradycardia noted, could have hyperK, calcium ordered Will consult cardiology  Joya Gaskins, MD 03/11/12 1935  7:49 PM D/w dr Mayford Knife, she reviewed EKGs She has had evidence of rate related LBBB previously would not send to cath lab Admit to critical care  Joya Gaskins, MD 03/11/12 1952

## 2012-03-11 NOTE — ED Notes (Signed)
Respiratory at bedside, assisting ventilation.

## 2012-03-11 NOTE — Procedures (Signed)
Central Venous Catheter Insertion Procedure Note TAZIA ILLESCAS [id number] Sep 18, 1940  Procedure: Insertion of Central Venous Catheter Indications: Drug and/or fluid administration  Procedure Details Consent: Risks of procedure as well as the alternatives and risks of each were explained to the (patient/caregiver).  Consent for procedure obtained. Time Out: Verified patient identification, verified procedure, site/side was marked, verified correct patient position, special equipment/implants available, medications/allergies/relevent history reviewed, required imaging and test results available.  Performed  Maximum sterile technique was used including antiseptics, cap, gloves, gown, hand hygiene, mask and sheet. Skin prep: Chlorhexidine; local anesthetic administered A antimicrobial bonded/coated triple lumen catheter was placed in the right internal jugular vein using the Seldinger technique.  Evaluation Blood flow good Complications: No apparent complications Patient did tolerate procedure well. Chest X-ray ordered to verify placement.  CXR: pending.  GIDDINGS, OLIVIA K. 03/11/2012, 10:06 PM

## 2012-03-11 NOTE — Progress Notes (Signed)
03/11/12 2100  Clinical Encounter Type  Visited With Patient and family together  Visit Type ED  Referral From Nurse   I sat with the patient's daughter and prayed with her while her mother was being treated in the ED. Patient will be moved to ICU tonight.   Veryl Speak

## 2012-03-11 NOTE — ED Notes (Signed)
ART line placed by critical care MD at this time.

## 2012-03-12 DIAGNOSIS — R9431 Abnormal electrocardiogram [ECG] [EKG]: Secondary | ICD-10-CM

## 2012-03-12 DIAGNOSIS — I4891 Unspecified atrial fibrillation: Secondary | ICD-10-CM

## 2012-03-12 DIAGNOSIS — J9601 Acute respiratory failure with hypoxia: Secondary | ICD-10-CM

## 2012-03-12 LAB — BLOOD GAS, ARTERIAL
Drawn by: 13898
MECHVT: 500 mL
PEEP: 10 cmH2O
Patient temperature: 98.6
RATE: 16 resp/min
TCO2: 23.1 mmol/L (ref 0–100)
pCO2 arterial: 41.5 mmHg (ref 35.0–45.0)
pH, Arterial: 7.34 — ABNORMAL LOW (ref 7.350–7.450)

## 2012-03-12 LAB — BASIC METABOLIC PANEL
BUN: 22 mg/dL (ref 6–23)
CO2: 24 mEq/L (ref 19–32)
Chloride: 104 mEq/L (ref 96–112)
Creatinine, Ser: 1.98 mg/dL — ABNORMAL HIGH (ref 0.50–1.10)
Glucose, Bld: 193 mg/dL — ABNORMAL HIGH (ref 70–99)
Potassium: 3.2 mEq/L — ABNORMAL LOW (ref 3.5–5.1)

## 2012-03-12 LAB — URINALYSIS, ROUTINE W REFLEX MICROSCOPIC
Glucose, UA: 100 mg/dL — AB
Protein, ur: 30 mg/dL — AB
Specific Gravity, Urine: 1.014 (ref 1.005–1.030)

## 2012-03-12 LAB — CBC
HCT: 35.8 % — ABNORMAL LOW (ref 36.0–46.0)
Hemoglobin: 11.4 g/dL — ABNORMAL LOW (ref 12.0–15.0)
MCV: 82.1 fL (ref 78.0–100.0)
WBC: 17.8 10*3/uL — ABNORMAL HIGH (ref 4.0–10.5)

## 2012-03-12 LAB — SODIUM, URINE, RANDOM: Sodium, Ur: 75 mEq/L

## 2012-03-12 LAB — URINE MICROSCOPIC-ADD ON

## 2012-03-12 LAB — MAGNESIUM: Magnesium: 1.7 mg/dL (ref 1.5–2.5)

## 2012-03-12 MED ORDER — DOPAMINE-DEXTROSE 3.2-5 MG/ML-% IV SOLN
2.0000 ug/kg/min | INTRAVENOUS | Status: DC
  Filled 2012-03-12: qty 250

## 2012-03-12 MED ORDER — POTASSIUM CHLORIDE 20 MEQ/15ML (10%) PO LIQD
60.0000 meq | Freq: Once | ORAL | Status: AC
  Administered 2012-03-12: 60 meq
  Filled 2012-03-12: qty 45

## 2012-03-12 MED ORDER — ATROPINE SULFATE 1 MG/ML IJ SOLN
INTRAMUSCULAR | Status: AC
  Filled 2012-03-12: qty 1

## 2012-03-12 MED ORDER — ONDANSETRON HCL 4 MG/2ML IJ SOLN
4.0000 mg | Freq: Three times a day (TID) | INTRAMUSCULAR | Status: DC | PRN
  Administered 2012-03-12: 4 mg via INTRAVENOUS

## 2012-03-12 MED ORDER — CHLORHEXIDINE GLUCONATE 0.12 % MT SOLN
OROMUCOSAL | Status: AC
  Administered 2012-03-12: 15 mL
  Filled 2012-03-12: qty 15

## 2012-03-12 MED ORDER — WARFARIN SODIUM 5 MG PO TABS
5.0000 mg | ORAL_TABLET | ORAL | Status: DC
  Administered 2012-03-12: 5 mg via ORAL
  Filled 2012-03-12 (×3): qty 1

## 2012-03-12 MED ORDER — INSULIN ASPART 100 UNIT/ML ~~LOC~~ SOLN
2.0000 [IU] | SUBCUTANEOUS | Status: DC
  Administered 2012-03-12 – 2012-03-13 (×2): 2 [IU] via SUBCUTANEOUS

## 2012-03-12 MED ORDER — POTASSIUM CHLORIDE 20 MEQ/15ML (10%) PO LIQD
40.0000 meq | ORAL | Status: AC
  Administered 2012-03-12 (×2): 40 meq
  Filled 2012-03-12 (×2): qty 30

## 2012-03-12 MED ORDER — SODIUM CHLORIDE 0.9 % IV SOLN
250.0000 mL | INTRAVENOUS | Status: DC | PRN
  Administered 2012-03-14: 250 mL via INTRAVENOUS

## 2012-03-12 MED ORDER — SODIUM CHLORIDE 0.9 % IV SOLN: Freq: Once | INTRAVENOUS | Status: DC

## 2012-03-12 MED ORDER — ONDANSETRON HCL 4 MG/2ML IJ SOLN
INTRAMUSCULAR | Status: AC
  Administered 2012-03-12: 4 mg via INTRAVENOUS
  Filled 2012-03-12: qty 2

## 2012-03-12 NOTE — Progress Notes (Signed)
Met pt's daughter in ED. Pt was intubated and was being admitted to ICU. I rec'd another call and when I returned pt's daughter had left. Follow-up recommended.  Marjory Lies Chaplain

## 2012-03-12 NOTE — ED Provider Notes (Signed)
History     CSN: [id number]  Arrival date & time 03/11/12  1910   First MD Initiated Contact with Patient 03/11/12 1924      Chief Complaint  Patient presents with  . Respiratory Distress    (Consider location/radiation/quality/duration/timing/severity/associated sxs/prior treatment) HPI Comments: Patient presents with respiratory distress. She was unable to provide a history due to her respiratory distress. She has reportedly had episodes of severe shortness of breath and required intubation for CHF exacerbations in the past. On presentation she was saturating in the 60% range and was given oxygen via bag valve mask. We were able to increase it to 92% bag-valve-mask. We then emergently intubated her for hypoxia and airway protection.  Patient is a 71 y.o. female presenting with shortness of breath. The history is provided by the EMS personnel. The history is limited by the condition of the patient. No language interpreter was used.  Shortness of Breath  The current episode started today. The onset was sudden. The problem occurs occasionally. The problem has been rapidly worsening. The problem is severe. Nothing relieves the symptoms. Nothing aggravates the symptoms. Associated symptoms include shortness of breath. Pertinent negatives include no chest pain and no chest pressure. There was no intake of a foreign body. The Heimlich maneuver was not attempted. She has had prior hospitalizations. She has had prior ICU admissions. She has had prior intubations. Her past medical history does not include asthma or past wheezing. Urine output has been normal. The last void occurred less than 6 hours ago. There were no sick contacts. She has received no recent medical care.    Past Medical History  Diagnosis Date  . CAD (coronary artery disease)     cath 11/11: LAD 40%, mid-dist 25-30%; prox CFX 30%, mid 40%; OM 50%; PDA 50%; PLV 50%;  EF 65%  . Acute myocardial infarction     in setting of AFib  with RVR 03/2010  . Atrial fibrillation     DCCV 11/17/11; coumadin & amiodarone  . HOCM (hypertrophic obstructive cardiomyopathy)     Echo 11/2011:severe LVH, EF 65-70%, mid-cavity gradient up to  . Hypertension   . Tobacco abuse   . Chronic kidney disease     Stage 4  . CHF (congestive heart failure)     preserved LVF  . Iron deficiency anemia   . Third degree uterine prolapse   . Hx MRSA infection     Past Surgical History  Procedure Date  . None   . Cardioversion 11/17/2011    Procedure: CARDIOVERSION;  Surgeon: Hillis Range, MD;  Location: [facility] OR;  Service: Cardiovascular;  Laterality: N/A;    Family History  Problem Relation Age of Onset  . Coronary artery disease Neg Hx   . Atrial fibrillation Neg Hx     History  Substance Use Topics  . Smoking status: Former Smoker -- 0.2 packs/day for 30 years    Types: Cigarettes    Quit date: 02/02/2012  . Smokeless tobacco: Never Used  . Alcohol Use: No    OB History    Grav Para Term Preterm Abortions TAB SAB Ect Mult Living                  Review of Systems  Unable to perform ROS: Intubated  Respiratory: Positive for shortness of breath.   Cardiovascular: Negative for chest pain.    Allergies  Review of patient's allergies indicates no known allergies.  Home Medications  No current outpatient prescriptions on  file.  BP 93/57  Pulse 51  Temp 99.7 F (37.6 C) (Core (Comment))  Resp 22  Wt 195 lb 5.2 oz (88.6 kg)  SpO2 94%  Physical Exam  Nursing note and vitals reviewed. Constitutional: She appears well-developed and well-nourished. She appears distressed (hypoxic, unresponsive).  HENT:  Head: Normocephalic and atraumatic.  Mouth/Throat: No oropharyngeal exudate.  Eyes: Pupils are equal, round, and reactive to light. Right eye exhibits no discharge. Left eye exhibits no discharge.  Neck: Normal range of motion. No JVD present.  Cardiovascular: Regular rhythm and normal heart sounds.        Sinus  tac  Pulmonary/Chest: No stridor. She is in respiratory distress. She has no wheezes. She has rales (diffuse rhonchi).  Abdominal: Soft. Bowel sounds are normal. She exhibits no distension. There is no guarding.  Musculoskeletal: She exhibits no edema.  Neurological:       Unresponsive due to resp distress  Skin: Skin is warm and dry. No rash noted. She is not diaphoretic.    ED Course  INTUBATION Performed by: Warrick Parisian Authorized by: Warrick Parisian Consent: The procedure was performed in an emergent situation. Indications: respiratory distress and hypoxemia Intubation method: video-assisted Patient status: paralyzed (RSI) Preoxygenation: BVM Sedatives: etomidate Paralytic: rocuronium Tube size: 7.5 mm Tube type: cuffed Number of attempts: 2 Ventilation between attempts: BVM Cricoid pressure: yes Cords visualized: yes Post-procedure assessment: chest rise and CO2 detector Breath sounds: equal Cuff inflated: yes ETT to lip: 24 cm Tube secured with: ETT holder Chest x-ray interpreted by me and radiologist. Chest x-ray findings: endotracheal tube in appropriate position Patient tolerance: Patient tolerated the procedure well with no immediate complications.   (including critical care time)  Labs Reviewed  CBC WITH DIFFERENTIAL - Abnormal; Notable for the following:    RDW 17.1 (*)     All other components within normal limits  BASIC METABOLIC PANEL - Abnormal; Notable for the following:    Sodium 134 (*)     CO2 18 (*)     Glucose, Bld 300 (*)     Creatinine, Ser 1.83 (*)     Calcium 11.3 (*)     GFR calc non Af Amer 27 (*)     GFR calc Af Amer 31 (*)     All other components within normal limits  PROTIME-INR - Abnormal; Notable for the following:    Prothrombin Time 21.4 (*)     INR 1.94 (*)     All other components within normal limits  PRO B NATRIURETIC PEPTIDE - Abnormal; Notable for the following:    Pro B Natriuretic peptide (BNP) 2010.0 (*)     All  other components within normal limits  GLUCOSE, CAPILLARY - Abnormal; Notable for the following:    Glucose-Capillary 183 (*)     All other components within normal limits  URINALYSIS, ROUTINE W REFLEX MICROSCOPIC - Abnormal; Notable for the following:    APPearance CLOUDY (*)     Glucose, UA 100 (*)     Hgb urine dipstick SMALL (*)     Protein, ur 100 (*)     All other components within normal limits  POCT I-STAT 3, BLOOD GAS (G3+) - Abnormal; Notable for the following:    pH, Arterial 7.126 (*)     pCO2 arterial 71.9 (*)     pO2, Arterial 60.0 (*)     Acid-base deficit 7.0 (*)     All other components within normal limits  POCT I-STAT, CHEM 8 -  Abnormal; Notable for the following:    BUN 26 (*)     Creatinine, Ser 1.90 (*)     Glucose, Bld 294 (*)     Calcium, Ion 1.48 (*)     All other components within normal limits  POCT I-STAT TROPONIN I - Abnormal; Notable for the following:    Troponin i, poc 0.09 (*)     All other components within normal limits  URINE MICROSCOPIC-ADD ON - Abnormal; Notable for the following:    Squamous Epithelial / LPF MANY (*)     All other components within normal limits  URINE CULTURE  CBC  BASIC METABOLIC PANEL  BLOOD GAS, ARTERIAL  MAGNESIUM  PHOSPHORUS  MRSA PCR SCREENING   Dg Chest Portable 1 View  03/11/2012  *RADIOLOGY REPORT*  Clinical Data: Respiratory distress.  PORTABLE CHEST - 1 VIEW  Comparison: 03/19/2012 and 1924 hours.  Findings: Endotracheal tube with tip about 5 cm above the carina. Enteric tube tip is in the left upper quadrant, likely in the mid stomach.  Interval placement of a right internal jugular central venous catheter with tip over the upper right atrium.  Cardiac enlargement with pulmonary vascular congestion and diffuse interstitial edema.  No blunting of costophrenic angles.  No pneumothorax.  IMPRESSION: Appliances positioned as described.  Persistent cardiac enlargement with pulmonary vascular congestion and diffuse  edema.   Original Report Authenticated By: Burman Nieves, M.D.    Dg Chest Portable 1 View  03/11/2012  *RADIOLOGY REPORT*  Clinical Data: Respiratory distress.  PORTABLE CHEST - 1 VIEW  Comparison: 01/31/2012  Findings: Endotracheal tube is 3 cm above the carina.  NG tube enters the stomach.  Cardiomegaly.  Diffuse bilateral airspace disease, increased since prior study compatible with worsening edema.  No effusions.  No acute bony abnormality.  IMPRESSION: Moderate CHF pattern.   Original Report Authenticated By: Charlett Nose, M.D.      1. Respiratory failure   2. Pulmonary edema   3. Hypertensive emergency   4. Abnormal EKG   5. Hypoxia       Date: 03/12/2012  Rate: 85  Rhythm: normal sinus rhythm  QRS Axis: normal  Intervals: ST depression v3-6, 2, avf  ST/T Wave abnormalities: normal  Conduction Disutrbances: wide qrs  Narrative Interpretation: rate related QRS widening  Old EKG Reviewed: qrs wide    MDM  Patient presented in respiratory distress. She was hypoxic. She has had this before with CHF exacerbations. She was emergently intubated. She tolerated this well and her oxygen saturations increased to the upper 80s from 60%. She was hypertensive she was started on nitroglycerin drip, she was also given 80 mg of IV Lasix. Her blood pressures went from systolics in the 200s to a systolic of 70 so she was given a fluid bolus and her propofol is decreased. Her blood pressure improved in response to this. Later on in the course of her care her blood pressure again dropped and she went bradycardic to the 40s and so she was given atropine. She was admitted to the ICU in critical condition. This is likely a CHF exacerbation with an element of cardiogenic shock. Flash Pulmonary edema may also have contributed to her pulmonary status on presentation.        Warrick Parisian, MD 03/12/12 0023  Warrick Parisian, MD 03/12/12 [phone]

## 2012-03-12 NOTE — Progress Notes (Signed)
Name: VALREE FEILD MRN: [id number] DOB: 02-26-1941    LOS: 1  Referring Provider:  ER physician Reason for Referral:  Hypoxic respiratory failure  PULMONARY / CRITICAL CARE MEDICINE  PCCM   Brief patient description:  71 y/o with HOCM admitted with respiratory failure secondary to pulmonary edema on 11/2  Events Since Admission: Intubated, central line placed Int edema remains  Current Status: Critical  Vital Signs: Temp:  [96.8 F (36 C)-100.2 F (37.9 C)] 97.9 F (36.6 C) (11/03 1619) Pulse Rate:  [44-110] 57  (11/03 1311) Resp:  [12-24] 22  (11/03 1311) BP: (39-226)/(17-144) 72/59 mmHg (11/03 1311) SpO2:  [86 %-100 %] 100 % (11/03 1631) Arterial Line BP: (76-156)/(51-76) 76/61 mmHg (11/03 1300) FiO2 (%):  [59.9 %-100 %] 60 % (11/03 1637) Weight:  [80 kg (176 lb 5.9 oz)-88.6 kg (195 lb 5.2 oz)] 88.6 kg (195 lb 5.2 oz) (11/03 0000) CVP:  [5 mmHg-6 mmHg] 6 mmHg  Physical Examination: General:  Elderly woman laying on stretcher, intubated Neuro:  Intubated and sedated HEENT:  PERRL, EOMI, ETT in place Neck:  Supple, no masses Cardiovascular:  II/VI systolic murmur at LUSB Lungs: scattered rhonchi  Abdomen:  Soft, NTND, +BS Musculoskeletal:  No joint abnormalities, 1+ edema Skin:  No rash, no bruising, no lesions  Principal Problem:  *Acute respiratory failure with hypoxia   ASSESSMENT AND PLAN  PULMONARY  Lab 03/12/12 0433 03/11/12 1957  PHART 7.340* 7.126*  PCO2ART 41.5 71.9*  PO2ART 96.0 60.0*  HCO3 21.8 23.7  O2SAT 96.9 80.0   Ventilator Settings: Vent Mode:  [-] PRVC FiO2 (%):  [59.9 %-100 %] 60 % Set Rate:  [16 bmp-22 bmp] 22 bmp Vt Set:  [500 mL] 500 mL PEEP:  [5 cmH20-10 cmH20] 10 cmH20 Plateau Pressure:  [20 cmH20-23 cmH20] 22 cmH20 CXR:  Diffuse bilateral infiltrates consistent with pulmonary edema ETT:  2.8 cm above carina  A:  Intubated for hypoxic respiratory failure associated with pulmonary edema Was on PEEP of 10, 8cc/kg, have  moved this to Peep 5 and 40%. Tolerating this well.  P: F/u CXR in am Wean ventilatory as possible (have decreased PEEP to 5) VAP prevention bundle ordered Albuterol/Ippratroprim Q4. Sputum C&S Maintain current MV Neg balance goal, with residual diuresis  CARDIOVASCULAR  Lab 03/11/12 1931  TROPONINI --  LATICACIDVEN --  PROBNP 2010.0*   ECG:  Sinus bradycardia with ST depressions Lines: right IJ placed 11/2  A: Hypertensive emergency, HOCM, Diastolic HF, AFIB Hypotension. Favor PEEP effect and diuretics as CVP was only 5-6 on 10 of PEEP Initially started on nitro drip for hypertension however shortly after intubation pt became hypotensive, suspect that she is very pre-load dependent and combination of positive pressure ventilation and vasodilation with propofol caused hypotension.  Plan: Continue amiodarone - pt has been bradycardic so rate control not a problem at this time Briefly started on dopamine for hypotension - avoid further with HCOM, would use Phenylphrine without beta affect Hold diuretics further diuretics for now, will add gentle IVF Continue warfarin for anticoagulation for now - if needing procedures will need to convert to heparin drip.   RENAL  Lab 03/12/12 0400 03/11/12 2010 03/11/12 1931  NA 139 138 134*  K 3.2* 4.3 --  CL 104 106 100  CO2 24 -- 18*  BUN 22 26* 18  CREATININE 1.98* 1.90* 1.83*  CALCIUM 10.9* -- 11.3*  MG 1.7 -- --  PHOS 2.8 -- --   Intake/Output      11/02  1478 - 11/03 0700 11/03 0701 - 11/04 0700   I.V. (mL/kg) 100.8 (1.1) 706.5 (8)   IV Piggyback 10    Total Intake(mL/kg) 110.8 (1.3) 706.5 (8)   Urine (mL/kg/hr) 580 (0.3) 125 (0.1)   Total Output 580 125   Net -469.2 +581.5         Foley:  Placed 11/2   A:   Acute on chronic renal failure hypokalemia P:   Replace and recheck K  Hold diuresis , follow trend crt / bun Continue to monitor output Renally dose all medications.   GASTROINTESTINAL No results found for  this basename: AST:5,ALT:5,ALKPHOS:5,BILITOT:5,PROT:5,ALBUMIN:5 in the last 168 hours  A:  At risk for stress ulcer P:   GI prophylaxis with PPI Start tubefeeds in am if not extubated   HEMATOLOGIC  Lab 03/12/12 0400 03/11/12 2010 03/11/12 1931 03/06/12  HGB 11.4* 14.6 12.6 --  HCT 35.8* 43.0 39.5 --  PLT 241 -- 156 --  INR -- -- 1.94* 2.7  APTT -- -- -- --   A:   Anitcoagulated for AFIB Anemia. Suspect 14.6 lab error. No evidence of bleeding  P:  Cont PPI  Continue warfarin for now target INR 2-3.  INFECTIOUS  Lab 03/12/12 0400 03/11/12 1931  WBC 17.8* 8.9  PROCALCITON -- --   Cultures: Sputum 11/3>>> Antibiotics: None   A:  At risk for ventilator associated pneumonia, now w/ rising WBC  P:   VAP prevention bundle ordered. Monitor for signs and symptoms of infection Pan culture for temp>38.5C Pct protocol not needed  ENDOCRINE  Lab 03/11/12 1914  GLUCAP 183*   A:  Hyperglycemia   P:   Start SSI  NEUROLOGIC  A:  sedated Pt did not tolerate propofol secondary to bradycardia and hypotension P Cont fentanyl and versed WUA  BEST PRACTICE / DISPOSITION Level of Care:  ICU Primary Service:  PCCM Consultants:  cardiology Code Status:  full Diet:  NPO DVT Px:  warfarin GI Px:  PPI Skin Integrity:  good Social / Family:  Daughter at bedside.  BABCOCK,PETE,  Ccm time 30 min I personally examined, evlauted, edited note and managed.   Mcarthur Rossetti. Tyson Alias, MD, FACP Pgr: [phone] [facility]

## 2012-03-13 ENCOUNTER — Encounter (HOSPITAL_COMMUNITY): Payer: Self-pay | Admitting: Nurse Practitioner

## 2012-03-13 ENCOUNTER — Inpatient Hospital Stay (HOSPITAL_COMMUNITY): Payer: Medicare Other

## 2012-03-13 DIAGNOSIS — J810 Acute pulmonary edema: Secondary | ICD-10-CM

## 2012-03-13 DIAGNOSIS — I5033 Acute on chronic diastolic (congestive) heart failure: Secondary | ICD-10-CM

## 2012-03-13 DIAGNOSIS — J9600 Acute respiratory failure, unspecified whether with hypoxia or hypercapnia: Secondary | ICD-10-CM

## 2012-03-13 LAB — BASIC METABOLIC PANEL
CO2: 21 mEq/L (ref 19–32)
Chloride: 117 mEq/L — ABNORMAL HIGH (ref 96–112)
Glucose, Bld: 111 mg/dL — ABNORMAL HIGH (ref 70–99)
Potassium: 4.2 mEq/L (ref 3.5–5.1)
Sodium: 144 mEq/L (ref 135–145)

## 2012-03-13 LAB — CBC WITH DIFFERENTIAL/PLATELET
Eosinophils Absolute: 0.2 10*3/uL (ref 0.0–0.7)
Hemoglobin: 9 g/dL — ABNORMAL LOW (ref 12.0–15.0)
Lymphocytes Relative: 8 % — ABNORMAL LOW (ref 12–46)
Lymphs Abs: 0.9 10*3/uL (ref 0.7–4.0)
MCH: 25.8 pg — ABNORMAL LOW (ref 26.0–34.0)
Monocytes Relative: 7 % (ref 3–12)
Neutro Abs: 8.6 10*3/uL — ABNORMAL HIGH (ref 1.7–7.7)
Neutrophils Relative %: 83 % — ABNORMAL HIGH (ref 43–77)
RBC: 3.49 MIL/uL — ABNORMAL LOW (ref 3.87–5.11)

## 2012-03-13 LAB — BLOOD GAS, ARTERIAL
Drawn by: 252031
O2 Saturation: 98.4 %
PEEP: 5 cmH2O
Patient temperature: 98.6
RATE: 22 resp/min

## 2012-03-13 LAB — POCT I-STAT 3, ART BLOOD GAS (G3+)
pCO2 arterial: 47 mmHg — ABNORMAL HIGH (ref 35.0–45.0)
pH, Arterial: 7.282 — ABNORMAL LOW (ref 7.350–7.450)
pO2, Arterial: 71 mmHg — ABNORMAL LOW (ref 80.0–100.0)

## 2012-03-13 LAB — GLUCOSE, CAPILLARY
Glucose-Capillary: 121 mg/dL — ABNORMAL HIGH (ref 70–99)
Glucose-Capillary: 127 mg/dL — ABNORMAL HIGH (ref 70–99)
Glucose-Capillary: 94 mg/dL (ref 70–99)

## 2012-03-13 LAB — URINE CULTURE
Colony Count: NO GROWTH
Culture: NO GROWTH

## 2012-03-13 LAB — COMPREHENSIVE METABOLIC PANEL
AST: 23 U/L (ref 0–37)
Albumin: 2.8 g/dL — ABNORMAL LOW (ref 3.5–5.2)
Calcium: 11.3 mg/dL — ABNORMAL HIGH (ref 8.4–10.5)
Creatinine, Ser: 1.99 mg/dL — ABNORMAL HIGH (ref 0.50–1.10)
Total Protein: 6.7 g/dL (ref 6.0–8.3)

## 2012-03-13 LAB — CBC
MCH: 26.6 pg (ref 26.0–34.0)
MCV: 80.8 fL (ref 78.0–100.0)
Platelets: 200 10*3/uL (ref 150–400)
RDW: 16.7 % — ABNORMAL HIGH (ref 11.5–15.5)
WBC: 12.6 10*3/uL — ABNORMAL HIGH (ref 4.0–10.5)

## 2012-03-13 LAB — PROTIME-INR: INR: 2.56 — ABNORMAL HIGH (ref 0.00–1.49)

## 2012-03-13 LAB — PROCALCITONIN: Procalcitonin: 11.88 ng/mL

## 2012-03-13 LAB — TROPONIN I: Troponin I: <0.3 ng/mL (ref <0.30)

## 2012-03-13 MED ORDER — ALBUTEROL SULFATE (5 MG/ML) 0.5% IN NEBU: 2.5000 mg | INHALATION_SOLUTION | RESPIRATORY_TRACT | Status: DC | PRN

## 2012-03-13 MED ORDER — METOPROLOL TARTRATE 1 MG/ML IV SOLN
5.0000 mg | Freq: Once | INTRAVENOUS | Status: AC
  Administered 2012-03-13: 5 mg via INTRAVENOUS
  Filled 2012-03-13: qty 5

## 2012-03-13 MED ORDER — LORAZEPAM 2 MG/ML IJ SOLN
0.5000 mg | Freq: Once | INTRAMUSCULAR | Status: AC
  Administered 2012-03-13: 0.5 mg via INTRAVENOUS
  Filled 2012-03-13: qty 1

## 2012-03-13 MED ORDER — ALBUTEROL SULFATE (5 MG/ML) 0.5% IN NEBU
2.5000 mg | INHALATION_SOLUTION | RESPIRATORY_TRACT | Status: DC
  Administered 2012-03-13 – 2012-03-14 (×3): 2.5 mg via RESPIRATORY_TRACT
  Filled 2012-03-13 (×3): qty 0.5

## 2012-03-13 MED ORDER — ALBUTEROL SULFATE (5 MG/ML) 0.5% IN NEBU
2.5000 mg | INHALATION_SOLUTION | Freq: Once | RESPIRATORY_TRACT | Status: AC
  Administered 2012-03-13: 2.5 mg via RESPIRATORY_TRACT

## 2012-03-13 MED ORDER — METOPROLOL TARTRATE 12.5 MG HALF TABLET
12.5000 mg | ORAL_TABLET | Freq: Two times a day (BID) | ORAL | Status: DC
  Administered 2012-03-13 – 2012-03-17 (×8): 12.5 mg via ORAL
  Filled 2012-03-13 (×9): qty 1

## 2012-03-13 MED ORDER — FUROSEMIDE 10 MG/ML IJ SOLN
40.0000 mg | Freq: Once | INTRAMUSCULAR | Status: AC
  Administered 2012-03-13: 40 mg via INTRAVENOUS
  Filled 2012-03-13: qty 4

## 2012-03-13 MED ORDER — IPRATROPIUM BROMIDE 0.02 % IN SOLN
0.5000 mg | RESPIRATORY_TRACT | Status: DC
  Administered 2012-03-13 – 2012-03-14 (×3): 0.5 mg via RESPIRATORY_TRACT
  Filled 2012-03-13 (×3): qty 2.5

## 2012-03-13 MED ORDER — ALBUTEROL SULFATE (5 MG/ML) 0.5% IN NEBU
INHALATION_SOLUTION | RESPIRATORY_TRACT | Status: AC
  Administered 2012-03-13: 21:00:00
  Filled 2012-03-13: qty 0.5

## 2012-03-13 MED ORDER — IPRATROPIUM BROMIDE 0.02 % IN SOLN
RESPIRATORY_TRACT | Status: AC
  Administered 2012-03-13: 21:00:00
  Filled 2012-03-13: qty 2.5

## 2012-03-13 MED ORDER — PHENOL 1.4 % MT LIQD
1.0000 | OROMUCOSAL | Status: DC | PRN
  Filled 2012-03-13: qty 177

## 2012-03-13 MED ORDER — VANCOMYCIN HCL 1000 MG IV SOLR
1500.0000 mg | INTRAVENOUS | Status: AC
  Administered 2012-03-13: 1500 mg via INTRAVENOUS
  Filled 2012-03-13: qty 1500

## 2012-03-13 MED ORDER — METHYLPREDNISOLONE SODIUM SUCC 125 MG IJ SOLR
80.0000 mg | Freq: Three times a day (TID) | INTRAMUSCULAR | Status: DC
  Administered 2012-03-13 – 2012-03-14 (×2): 80 mg via INTRAVENOUS
  Filled 2012-03-13 (×5): qty 1.28

## 2012-03-13 MED ORDER — IPRATROPIUM BROMIDE 0.02 % IN SOLN
0.5000 mg | Freq: Once | RESPIRATORY_TRACT | Status: AC
  Administered 2012-03-13: 0.5 mg via RESPIRATORY_TRACT

## 2012-03-13 MED ORDER — VANCOMYCIN HCL IN DEXTROSE 1-5 GM/200ML-% IV SOLN
1000.0000 mg | INTRAVENOUS | Status: DC
Start: 2012-03-14 — End: 2012-03-15
  Administered 2012-03-14: 1000 mg via INTRAVENOUS
  Filled 2012-03-13 (×2): qty 200

## 2012-03-13 MED ORDER — IPRATROPIUM BROMIDE 0.02 % IN SOLN: 0.5000 mg | RESPIRATORY_TRACT | Status: DC | PRN

## 2012-03-13 NOTE — Care Management Note (Signed)
    Page 1 of 1   03/17/2012     4:14:47 PM   CARE MANAGEMENT NOTE 03/17/2012  Patient:  [name], [name]   Account Number:  [id number]  Date Initiated:  03/13/2012  Documentation initiated by:  [name]  Subjective/Objective Assessment:   Resp failure - requiring intubation.     Action/Plan:   Anticipated DC Date:  03/20/2012   Anticipated DC Plan:  HOME W HOME HEALTH SERVICES      DC Planning Services  CM consult      Choice offered to / List presented to:             Status of service:  Completed, signed off Medicare Important Message given?   (If response is "NO", the following Medicare IM given date fields will be blank) Date Medicare IM given:   Date Additional Medicare IM given:    Discharge Disposition:  HOME/SELF CARE  Per UR Regulation:  Reviewed for med. necessity/level of care/duration of stay  If discussed at Long Length of Stay Meetings, dates discussed:    Comments:  ContactEloyce, [place] Daughter [phone]   [facility] Daughter [phone]  03/17/12 Anida Deol,RN,BSN [phone] CM REFERRAL FOR HOME OXGEN.   PT DOES NOT QUALIFY FOR HOME OXYGEN AS O2 SATS WITH AMBULATION 92%; MUST BE 88% OR LESS. BEDSIDE RN NOTIFIED MD, WHO CANCELLED ORDER FOR HOME O2.  03-13-12 11:50am [name], [name] [phone] Per previous history patient  uses a cane at home and lives in [place] and the pt's daughter lives upstairs from her. Has had HH with [facility] in past.

## 2012-03-13 NOTE — Procedures (Signed)
Extubation Procedure Note  Patient Details:   Name: [name] DOB: Mar 08, 1941 MRN: [id number]   Airway Documentation:     Evaluation  O2 sats: stable throughout Complications: No apparent complications Patient did tolerate procedure well. Bilateral Breath Sounds: Diminished Suctioning: Airway Yes  Newt Lukes 03/13/2012, 10:10 AM

## 2012-03-13 NOTE — Progress Notes (Signed)
ANTIBIOTIC CONSULT NOTE - INITIAL  Pharmacy Consult for vancomycin Indication: pneumonia  No Known Allergies  Patient Measurements: Height: 5\' 3"  (160 cm) Weight: 195 lb 5.2 oz (88.6 kg) (bed weight scale) IBW/kg (Calculated) : 52.4   Vital Signs: Temp: 99.8 F (37.7 C) (11/04 1500) Temp src: Oral (11/04 1500) BP: 123/79 mmHg (11/04 1900) Pulse Rate: 100  (11/04 1900) Intake/Output from previous day: 11/03 0701 - 11/04 0700 In: 1606.6 [I.V.:1606.6] Out: 1440 [Urine:840; Emesis/NG output:600] Intake/Output from this shift:    Labs:  Basename 03/13/12 0337 03/12/12 0400 03/11/12 2010 03/11/12 1931  WBC 12.6* 17.8* -- 8.9  HGB 10.1* 11.4* 14.6 --  PLT 200 241 -- 156  LABCREA -- -- -- --  CREATININE 1.99* 1.98* 1.90* --   Estimated Creatinine Clearance: 27.4 ml/min (by C-G formula based on Cr of 1.99).  Medical History: Past Medical History  Diagnosis Date  . CAD (coronary artery disease)     cath 11/11: LAD 40%, mid-dist 25-30%; prox CFX 30%, mid 40%; OM 50%; PDA 50%; PLV 50%;  EF 65%  . Acute myocardial infarction     in setting of AFib with RVR 03/2010  . Atrial fibrillation     DCCV 11/17/11; coumadin & amiodarone  . HOCM (hypertrophic obstructive cardiomyopathy)     Echo 11/2011:severe LVH, EF 65-70%, mid-cavity gradient up to  . Hypertension   . Tobacco abuse   . Chronic kidney disease     Stage 4  . CHF (congestive heart failure)     preserved LVF  . Iron deficiency anemia   . Third degree uterine prolapse   . Hx MRSA infection     Medications:  Prescriptions prior to admission  Medication Sig Dispense Refill  . amiodarone (PACERONE) 200 MG tablet Take 200 mg by mouth daily. Take 200mg  daily starting 12/16/11      . furosemide (LASIX) 40 MG tablet Take 40 mg by mouth daily.      . metoprolol tartrate (LOPRESSOR) 25 MG tablet Take 25 mg by mouth 2 (two) times daily.      . nitroGLYCERIN (NITROSTAT) 0.4 MG SL tablet Place 0.4 mg under the tongue  every 5 (five) minutes as needed. For chest pain      . pantoprazole (PROTONIX) 40 MG tablet Take 40 mg by mouth daily.      . rosuvastatin (CRESTOR) 10 MG tablet Take 10 mg by mouth at bedtime.       Marland Kitchen warfarin (COUMADIN) 5 MG tablet Take 2.5-5 mg by mouth daily. 2.5 mg Monday, Wednesday, Friday; 5mg  Tuesday, Thursday, Saturday, Sunday       Assessment: 71 yo female with WBC =12.6 and possible infiltrate on CXR with GPC in pairs in respiratory cultures to begin vancomycin.  Goal of Therapy:  Vancomycin trough level 15-20 mcg/ml  Plan:  -Vancomycin 1500mg  IV x1 followed by 1000mg  IV q24hr -Will follow renal function and levels as needed  Harland German, Pharm D 03/13/2012 8:55 PM

## 2012-03-13 NOTE — Progress Notes (Signed)
eLink Physician-Brief Progress Note Patient Name: [name] DOB: 05-03-1941 MRN: [id number]  Date of Service  03/13/2012   HPI/Events of Note  Pulm infiltrates, elevated WBC, respiratory BAL culture >100000 MRSA   eICU Interventions  Initiate Vanc       Samani Deal 03/13/2012, 8:28 PM

## 2012-03-13 NOTE — Consult Note (Signed)
CARDIOLOGY CONSULT NOTE  Patient ID: [name] MRN: [id number], DOB/AGE: 71/17/1942   Admit date: 03/11/2012 Date of Consult: 03/13/2012   Primary Physician: Oliver Barre, MD Primary Cardiologist: Shela Commons. Cerria Randhawa, MD  Pt. Profile  71 y/o female with h/o HOCM and flash pulm edema who presented 11/2 with recurrent pulm edema in the setting of hypertensive emergency.  Problem List  Past Medical History  Diagnosis Date  . CAD (coronary artery disease)     cath 11/11: LAD 40%, mid-dist 25-30%; prox CFX 30%, mid 40%; OM 50%; PDA 50%; PLV 50%;  EF 65%  . Acute myocardial infarction     in setting of AFib with RVR 03/2010  . Atrial fibrillation     DCCV 11/17/11; coumadin & amiodarone  . HOCM (hypertrophic obstructive cardiomyopathy)     Echo 11/2011:severe LVH, EF 65-70%, mid-cavity gradient up to  . Hypertension   . Tobacco abuse   . Chronic kidney disease     Stage 4  . CHF (congestive heart failure)     preserved LVF  . Iron deficiency anemia   . Third degree uterine prolapse   . Hx MRSA infection     Past Surgical History  Procedure Date  . None   . Cardioversion 11/17/2011    Procedure: CARDIOVERSION;  Surgeon: Hillis Range, MD;  Location: [facility] OR;  Service: Cardiovascular;  Laterality: N/A;     Allergies  No Known Allergies  HPI   [name] is a 71 yo female with the above problem list.  She was last hospitalized in September with acute pulm edema requiring brief intubation.  She was in her USOH until Saturday afternoon when after eating chitterlings and green beans, which she does not believe were particularly salty, when she started feeling poorly and later developed profound dyspnea.  EMS was called and she was found to be markedly hypertensive.  She was taken to [facility] ED where she was unresponsive and initially required bag mask ventilation and later intubation.  She was treated with IV ntg and lasix for pulmonary edema with subsequent marked hypotension  requiring initiation of dopamine.  Since admission, her volume status quickly improved as did respiratory status and she was extubated this AM.  Her CVP's have been running 3-6.  She has no complaints at this time and dopamine was just weaned off.  Of note, she doesn't exactly know how her pressures run at home though says that a [facility] comes out to her house 3 x/wk and tells her that her pressure is fine.  She avoids fast and processed foods but won't commit to saying that she avoids salt altogether.  Inpatient Medications    . [EXPIRED] aspirin  324 mg Oral NOW   Or  . [EXPIRED] aspirin  300 mg Rectal NOW  . atorvastatin  20 mg Oral q1800  . pantoprazole (PROTONIX) IV  40 mg Intravenous Q24H  . [COMPLETED] potassium chloride  40 mEq Per Tube Q4H  . warfarin  2.5 mg Oral Custom  . warfarin  5 mg Oral Custom  . Warfarin - Physician Dosing Inpatient   Does not apply q1800  . [DISCONTINUED] sodium chloride   Intravenous Once  . [DISCONTINUED] albuterol-ipratropium  4 puff Inhalation Q4H  . [DISCONTINUED] amiodarone  200 mg Oral Daily  . [DISCONTINUED] insulin aspart  2-6 Units Subcutaneous Q4H  . [DISCONTINUED] metoprolol tartrate  25 mg Oral BID   Family History Family History  Problem Relation Age of Onset  .  Coronary artery disease Neg Hx   . Atrial fibrillation Neg Hx     Social History History   Social History  . Marital Status: Single    Spouse Name: N/A    Number of Children: N/A  . Years of Education: N/A   Occupational History  . Not on file.   Social History Main Topics  . Smoking status: Former Smoker -- 0.2 packs/day for 30 years    Types: Cigarettes    Quit date: 02/02/2012  . Smokeless tobacco: Never Used     Comment: quit 01/2012  . Alcohol Use: No  . Drug Use: No  . Sexually Active: No   Other Topics Concern  . Not on file   Social History Narrative   She lives with her dtr in [place].  She quit smoking 2 mos ago.    Review of Systems  General:  No  chills, fever, night sweats or weight changes.  Cardiovascular:+ dyspnea on exertion, + orthopnea,  No chest pain, edema,  palpitations, paroxysmal nocturnal dyspnea. Dermatological: No rash, lesions/masses Respiratory: No cough, dyspnea Urologic: No hematuria, dysuria Abdominal:   No nausea, vomiting, diarrhea, bright red blood per rectum, melena, or hematemesis Neurologic: +wkns, No visual changes, changes in mental status. All other systems reviewed and are otherwise negative except as noted above.  Physical Exam  Blood pressure 116/55, pulse 95, temperature 99.8 F (37.7 C), temperature source Oral, resp. rate 29, height 5\' 3"  (1.6 m), weight 195 lb 5.2 oz (88.6 kg), SpO2 99.00%.  General: Pleasant, NAD Psych: Normal affect. Neuro: Alert and oriented X 3. Moves all extremities spontaneously. HEENT: Normal.  Hoarse voice (extubated earlier today)  Neck: Supple without bruits or JVD on L. R with central venous line. Lungs:  Resp regular and unlabored, scatt rhonchi and diminished breath sounds. Heart: RRR no s3, s4. 2/6 SEM llsb->apex. Abdomen: Soft, non-tender, non-distended, BS + x 4.  Extremities: No clubbing, cyanosis or edema. DP/PT/Radials 2+ and equal bilaterally.  Labs  Lab Results  Component Value Date   WBC 12.6* 03/13/2012   HGB 10.1* 03/13/2012   HCT 30.7* 03/13/2012   MCV 80.8 03/13/2012   PLT 200 03/13/2012     Lab 03/13/12 0337  NA 139  K 5.1  CL 111  CO2 21  BUN 27*  CREATININE 1.99*  CALCIUM 11.3*  PROT 6.7  BILITOT 0.6  ALKPHOS 120*  ALT 12  AST 23  GLUCOSE 161*   Radiology/Studies  Dg Chest Port 1 View  03/13/2012  *RADIOLOGY REPORT*  Clinical Data: Check endotracheal tube  PORTABLE CHEST - 1 VIEW  Comparison: 03/11/2012  Findings: Cardiomegaly again noted.  Central vascular congestion. Stable NG tube position.  Stable right IJ central line position with tip in the right atrium.  Endotracheal tube in place with tip 4.7 cm above the carina.  Probable  small bilateral pleural effusion with bilateral basilar atelectasis or infiltrate.  IMPRESSION: Stable support apparatus.  Central vascular congestion.  Probable small bilateral pleural effusion with bilateral basilar atelectasis or infiltrate.   Original Report Authenticated By: Natasha Mead, M.D.    ECHO: 11/16/2011 Study Conclusions - Left ventricle: The cavity size was normal. There was severe concentric hypertrophy. Systolic function was vigorous. The estimated ejection fraction was in the range of 65% to 70%. There was an LV mid cavity gradient reaching a peak of 81 mmHg. Wall motion was normal; there were no regional wall motion abnormalities. Indeterminant diastolic function. - Aortic valve: Poorly visualized. Probably trileaflet.  Probably no significant aortic valve stenosis. - Mitral valve: Moderately calcified annulus. Mild regurgitation. - Left atrium: The atrium was moderately dilated. - Right ventricle: The cavity size was normal. Systolic function was normal. - Right atrium: The atrium was mildly dilated. - Tricuspid valve: Peak RV-RA gradient: 32mm Hg (S). - Pulmonary arteries: PA peak pressure: 37mm Hg (S). - Inferior vena cava: The vessel was normal in size; the respirophasic diameter changes were in the normal range (=50%); findings are consistent with normal central venous pressure. - Pericardium, extracardiac: A trivial pericardial effusion was identified. Impressions: - The patient was tachycardic, possibly with atrial fibrillation (non-sinus rhythm). Normal LV size with severe LV hypertrophy. EF 65-70% with end-systolic cavity obliteration. There was a mid-cavity gradient up to 81 mmHg. The aortic valve was poorly visualized but doubt significant aortic stenosis. Normal RV size and systolic function with mild pulmonary hypertension.  ECG: 03/12/12  RSR, 78, LVH, IVCD  ASSESSMENT AND PLAN  1.  Acute on chronic diastolic CHF/Hypertensive Emergency/Acute Pulmonary Edema/HOCM:  Pt  presented with recurrent pulmonary edema in the setting of marked hypertension and questionable salt intake.  Suspect her outpatient blood pressure mgmt may be inadequate, now resulting in multiple admissions for hypertensive emergency and APE.  Currently she is euvolemic and normotensive and now off of pressors.  We will plan to add back her bb as soon as pressure allows and likely arrange for outpatient ambulatory BP monitoring to better assess outpt mgmt.  Will also plan on eventual outpt ischemic w/u.  Note that troponins are negative this admission.  2. Chronic Renal Failure - Creat 1.99 from 1.83 2 days ago.  5. Hyperglycemia/newly Dx DMII:  Fasting sugars >126.  A1c 5.9 in 2011.  Repeat.  6. Afib hx of - in SR on ECG 03/12/12.  Amio has been held in setting of hypotn.  INR Rx on coumadin. Signed, Nicolasa Ducking, NP 03/13/2012, 4:34 PM   History and all data above reviewed.  Patient examined.  I agree with the findings as above.  She has sudden onset SOB, no chest pain.  She reports med compliance.  I am not sure about how much salt she takes in before these events.The patient exam reveals COR:RRR with systolic murmur unchanged from previous  ,  Lungs: Few coarse crackles  ,  Abd: Positive bowel sounds, no rebound no guarding, Ext No edema  .  All available labs, radiology testing, previous records reviewed. Agree with documented assessment and plan. I suspect a hypertensive urgency as the acute etiology.  As an outpatient I will exclude ischemia.  I will order a dietary consult for 2 gram Na diet.  She will need to weigh herself daily at home and we will discuss PRN diuretic dosing.  I will plan an out patient 24 hour BP monitor.  For now, as her BP allows over the next few days, we will slowly start back some of her antihypertensives.   Fayrene Fearing Daksha Koone  5:35 PM  03/13/2012

## 2012-03-13 NOTE — Progress Notes (Signed)
Asked by eMD to evaluate re: increased WOB.  Admitted 11/2 with hypertensive emergency / acute pulmonary edema requiring intubation.  Extubated this AM.  Smoker, history of HOCM, CHF.  RR 28-25.  BP 120/76.  HR 95-100. SpO2 94-96% on 2L via [place]. Diminished bilateral air entry, expiratory wheezing, no rales, no JVD, no pedal edema.  WBC 12.6.  ABG / CXR pending.  Will restart bronchodilators and start systemic steroids for presumed COPD with exacerbation.  Will restart Metoprolol to control HR in presence of HOCM.  No indication for diuresis.  At this time does not require mechanical ventilatory support. eMD started on Vancomycin for presumed MRSA pneumonia.  Will check PCT level.  Orlean Bradford, M.D., F.C.C.P. Pulmonary and Critical Care Medicine [facility] Cell: [phone] Pager: [phone]

## 2012-03-13 NOTE — Progress Notes (Signed)
Name: [name] MRN: [id number] DOB: 1940/08/16    LOS: 2  REFERRING PRIVIDER:  Zadie Rhine CHIEF COMPLAINT:  Respiratory failure   BRIEF PATIENT DESCRIPTION:  71 yo female former smoker admitted 03/11/2012 with weakness and sudden onset of dyspnea from acute pulmonary edema requiring intubation and with acute encephalopathy with SBP of 220.  Developed bradycardia/hypotension after intubation.    Significant PMHx CAD, A fib, Diastolic CHF, HOCM, HTN, CKD stage 4, Anemia   LINES / TUBES: ETT 11/02>>11/04 Rt IJ CVL 11/02>> Rt radial aline 11/02>>11/04  CULTURES: Urine 11/02>> Sputum 11/02>>  ANTIBIOTICS: None  SIGNIFICANT EVENTS:    LEVEL OF CARE:  ICU PRIMARY SERVICE:  PCCM CONSULTANTS:  [facility] CODE STATUS DIET:  Full DVT Px:  Chronic coumadin GI Px:  Protonix  INTERVAL HISTORY:  Remains on pressors, but weaning down.  Tolerating SBT.    VITAL SIGNS: Temp:  [97.9 F (36.6 C)-99.4 F (37.4 C)] 98.7 F (37.1 C) (11/04 0744) Pulse Rate:  [49-71] 68  (11/04 0900) Resp:  [17-25] 22  (11/04 0900) BP: (72-142)/(46-87) 116/55 mmHg (11/04 0830) SpO2:  [98 %-100 %] 100 % (11/04 0900) Arterial Line BP: (71-141)/(50-132) 87/66 mmHg (11/04 0900) FiO2 (%):  [39.9 %-60.2 %] 40.2 % (11/04 0900) CVP 3  PHYSICAL EXAMINATION: General:  No distress Neuro:  Follows commands, moves all extremities HEENT:  ETT, OG tube in place Cardiovascular:  s1s2 regular, no murmur Lungs:  No wheeze, scattered rhonchi Abdomen:  Soft, non tender Musculoskeletal:  No edema Skin:  No rashes  DIAGNOSES: Principal Problem:  *Acute respiratory failure with hypoxia   ASSESSMENT / PLAN: PULMONARY  Lab 03/13/12 0247 03/12/12 0433 03/11/12 1957  PHART 7.375 7.340* 7.126*  PCO2ART 35.8 41.5 71.9*  PO2ART 92.6 96.0 60.0*  HCO3 20.4 21.8 23.7  O2SAT 98.4 96.9 80.0    Ventilator Settings: Vent Mode:  [-] PRVC FiO2 (%):  [39.9 %-60.2 %] 40.2 % Set Rate:  [22  bmp] 22 bmp Vt Set:  [500 mL] 500 mL PEEP:  [5 cmH20-10 cmH20] 5.3 cmH20 Plateau Pressure:  [18 cmH20-23 cmH20] 20 cmH20  CXR:  11/04 >> improved pulmonary edema pattern, decrease effusions   A:  Acute respiratory failure from acute pulmonary edema. P:   Proceed with extubate 11/04 Keep in even to negative fluid balance Adjust oxygen to keep SpO2 > 92% F/u CXR No hx of obstructive lung disease, and no bronchospasm>>discontinue BD's  CARDIOVASCULAR  Lab 03/11/12 1931  LATICACIDVEN --  O2SATVEN --  PROBNP 2010.0*    A: Initially with HTN emergency causing acute pulmonary edema. Hypotension, bradycardia after intubation. Hx of A fib, CAD, diastolic CHF, HOCM, hyperlipidemia. P:  D/c a line>>non functional Check troponin Cardiology consulted Negative fluid balance to keep CVP < 6 >> hold lasix 11/04 Wean off DA to keep SBP > 90, MAP > 60, HR > 60 F/u ECG Continue atorvastatin Hold amiodarone, metoprolol on hold while hypotensive/bradycardic  RENAL  Lab 03/13/12 0337 03/12/12 0400 03/11/12 2010 03/11/12 1931  NA 139 139 138 --  K 5.1 3.2* 4.3 --  CL 111 104 106 --  CO2 21 24 -- 18*  BUN 27* 22 26* --  CREATININE 1.99* 1.98* 1.90* --  CALCIUM 11.3* 10.9* -- 11.3*  MG -- 1.7 -- --  PHOS -- 2.8 -- --   Intake/Output      11/03 0701 - 11/04 0700 11/04 0701 - 11/05 0700   I.V. (mL/kg) 1606.6 (18.1) 80 (0.9)  IV Piggyback     Total Intake(mL/kg) 1606.6 (18.1) 80 (0.9)   Urine (mL/kg/hr) 840 (0.4) 190   Emesis/NG output 600    Total Output 1440 190   Net +166.6 -110          A:  CKD stage 4. Hypokalemia. P:   Monitor renal fx, urine outpt, electrolytes  GASTROINTESTINAL  Lab 03/13/12 0337  AST 23  ALT 12  ALKPHOS 120*  PROT 6.7  ALBUMIN 2.8*    A: Nutrition. P:   Advance diet as tolerated after extubation  HEMATOLOGIC  Lab 03/13/12 0337 03/12/12 1935 03/12/12 0400 03/11/12 2010 03/11/12 1931  HGB 10.1* -- 11.4* 14.6 --  HCT 30.7* -- 35.8* 43.0  --  PLT 200 -- 241 -- 156  INR 2.56* 2.35* -- -- 1.94*  APTT -- -- -- -- --    A:  Chronic coumadin tx for A fib. Anemia. P:  F/u CBC Coumadin per pharmacy  INFECTIOUS  Lab 03/13/12 0337 03/12/12 0400 03/11/12 1931  PROCALCITON -- -- --  WBC 12.6* 17.8* 8.9  LATICACIDVEN -- -- --    A:  No evidence for infection. P:   Monitor clinically  ENDOCRINE CBG  Lab 03/13/12 0346 03/12/12 2353 03/12/12 2056 03/11/12 1914  GLUCAP 127* 94 134* 183*    A:  Hyperglycemia.   No prior hx of DM.  Likely related to acute stress. P:   D/c SSI and monitor blood sugars on BMET.  NEUROLOGIC  A:  Acute encephalopathy from HTN emergency and acute hypoxic respiratory failure. Much improved 11/04. P:   Monitor mental status.   Critical care time 35 minutes.  Coralyn Helling, MD [facility] Pulmonary/Critical Care 03/13/2012, 10:02 AM Pager:  [phone] After 3pm call: [phone]

## 2012-03-13 NOTE — ED Provider Notes (Signed)
I have personally seen and examined the patient and discussed plan of care with the resident.  I was present for entire procedure.  I have reviewed the appropriate documentation on PMH/FH/Soc. History.  I have reviewed the documentation of the resident and agree.  I have reviewed and agree with the ECG interpretation(s) documented by the resident.   I spoke to family, I checked on patient frequently, I helped manage vent with resp therapy Also, patient became unstable just prior to transfer, bradycardic/hypotension, atropine given, HR improved.  Case signed out to critical care fellow.  Pt transferred to ICU in critical condition  CRITICAL CARE Performed by: Joya Gaskins   Total critical care time: 60  Critical care time was exclusive of separately billable procedures and treating other patients.  Critical care was necessary to treat or prevent imminent or life-threatening deterioration.  Critical care was time spent personally by me on the following activities: development of treatment plan with patient and/or surrogate as well as nursing, discussions with consultants, evaluation of patient's response to treatment, examination of patient, obtaining history from patient or surrogate, ordering and performing treatments and interventions, ordering and review of laboratory studies, ordering and review of radiographic studies, pulse oximetry and re-evaluation of patient's condition.    Joya Gaskins, MD 03/13/12 [phone]

## 2012-03-14 ENCOUNTER — Inpatient Hospital Stay (HOSPITAL_COMMUNITY): Payer: Medicare Other

## 2012-03-14 DIAGNOSIS — N185 Chronic kidney disease, stage 5: Secondary | ICD-10-CM

## 2012-03-14 DIAGNOSIS — I10 Essential (primary) hypertension: Secondary | ICD-10-CM

## 2012-03-14 LAB — CBC
MCH: 25.7 pg — ABNORMAL LOW (ref 26.0–34.0)
MCV: 83.7 fL (ref 78.0–100.0)
Platelets: 203 10*3/uL (ref 150–400)
RBC: 3.62 MIL/uL — ABNORMAL LOW (ref 3.87–5.11)
RDW: 17.3 % — ABNORMAL HIGH (ref 11.5–15.5)

## 2012-03-14 LAB — BASIC METABOLIC PANEL
CO2: 23 mEq/L (ref 19–32)
Calcium: 11.2 mg/dL — ABNORMAL HIGH (ref 8.4–10.5)
Creatinine, Ser: 2.13 mg/dL — ABNORMAL HIGH (ref 0.50–1.10)
Glucose, Bld: 166 mg/dL — ABNORMAL HIGH (ref 70–99)

## 2012-03-14 LAB — PROCALCITONIN: Procalcitonin: 14.33 ng/mL

## 2012-03-14 LAB — GLUCOSE, CAPILLARY: Glucose-Capillary: 185 mg/dL — ABNORMAL HIGH (ref 70–99)

## 2012-03-14 MED ORDER — WARFARIN - PHARMACIST DOSING INPATIENT: Freq: Every day | Status: DC

## 2012-03-14 MED ORDER — CEFTRIAXONE SODIUM 2 G IJ SOLR
2.0000 g | INTRAMUSCULAR | Status: AC
  Administered 2012-03-14 – 2012-03-15 (×2): 2 g via INTRAVENOUS
  Filled 2012-03-14 (×2): qty 2

## 2012-03-14 MED ORDER — FUROSEMIDE 40 MG PO TABS
40.0000 mg | ORAL_TABLET | Freq: Every day | ORAL | Status: DC
  Administered 2012-03-14 – 2012-03-15 (×2): 40 mg via ORAL
  Filled 2012-03-14 (×3): qty 1

## 2012-03-14 MED ORDER — PREDNISONE 20 MG PO TABS
20.0000 mg | ORAL_TABLET | Freq: Every day | ORAL | Status: DC
  Administered 2012-03-15 – 2012-03-17 (×3): 20 mg via ORAL
  Filled 2012-03-14 (×4): qty 1

## 2012-03-14 MED ORDER — PANTOPRAZOLE SODIUM 40 MG PO TBEC
40.0000 mg | DELAYED_RELEASE_TABLET | Freq: Every day | ORAL | Status: DC
  Administered 2012-03-14 – 2012-03-17 (×4): 40 mg via ORAL
  Filled 2012-03-14 (×3): qty 1

## 2012-03-14 MED ORDER — AMIODARONE HCL 200 MG PO TABS
200.0000 mg | ORAL_TABLET | Freq: Every day | ORAL | Status: DC
  Administered 2012-03-14 – 2012-03-17 (×4): 200 mg via ORAL
  Filled 2012-03-14 (×4): qty 1

## 2012-03-14 NOTE — Progress Notes (Addendum)
Nutrition Education Note  RD consulted for nutrition education regarding a 2 gm Sodium diet.  Patient inappropriate for education at this time; very sleepy with minimal responsiveness upon RD visitation.    Body mass index is 33.19 kg/(m^2).  Pt meets criteria for Obesity Class I based on current BMI.  Current diet order is Heart Healthy, patient is consuming approximately 75% of meals at this time.  RD to follow-up for diet education at later date.   Kirkland Hun, RD, LDN Pager #: [phone] After-Hours Pager #: [phone]

## 2012-03-14 NOTE — Progress Notes (Signed)
ANTICOAGULATION CONSULT NOTE - Follow Up Consult  Pharmacy Consult for Coumadin Indication: atrial fibrillation  No Known Allergies  Patient Measurements: Height: 5\' 3"  (160 cm) Weight: 187 lb 6.3 oz (85 kg) IBW/kg (Calculated) : 52.4   Vital Signs: Temp: 97.4 F (36.3 C) (11/05 0809) Temp src: Oral (11/05 0809) BP: 101/66 mmHg (11/05 1000) Pulse Rate: 75  (11/05 1000)  Labs:  Basename 03/14/12 0417 03/13/12 2100 03/13/12 1030 03/13/12 0337 03/12/12 1935  HGB 9.3* 9.0* -- -- --  HCT 30.3* 29.1* -- 30.7* --  PLT 203 188 -- 200 --  APTT -- -- -- -- --  LABPROT 32.6* -- -- 26.3* 24.7*  INR 3.42* -- -- 2.56* 2.35*  HEPARINUNFRC -- -- -- -- --  CREATININE 2.13* 1.77* -- 1.99* --  CKTOTAL -- -- -- -- --  CKMB -- -- -- -- --  TROPONINI -- -- <0.30 -- --    Estimated Creatinine Clearance: 25 ml/min (by C-G formula based on Cr of 2.13).  Assessment: [name] on coumadin for afib who was previously resumed on home dose (home dose. 2.5 MWF, 5mg  TTSS), INR was stable and and therapeutic on 11/3 and 11/4, but big jump to 3.42 today. Resumed home po amiodarone today. Hgb and plt has been stable, no bleeding per chart.  Goal of Therapy:  INR 2-3 Monitor platelets by anticoagulation protocol: Yes   Plan:  - Hold coumadin today due to supratherapeutic INR - monitor daily INR  Riki Rusk 03/14/2012,11:20 AM

## 2012-03-14 NOTE — Progress Notes (Signed)
Pt Had a 2 second pause at 22.33 PM. Pt is Sinus brady on the monitor at this time, Pt A/O x 3, Pt denies any pain at this time. Will notify MD and continue to monitor.

## 2012-03-14 NOTE — Progress Notes (Addendum)
Patient was given 0.5 mg of Ativan IV, 1.5mg  wasted with Shinichi Anguiano RN   Wilhelmina Mcardle, RN verified waste of Herbert Deaner, RN.  Wasted in sink.

## 2012-03-14 NOTE — Progress Notes (Signed)
SUBJECTIVE:  She was more SOB yesterday evening but is breathing better this am.  Now on steroids and bronchodilators last night. Also restarted beta blocker and given a dose of Lasix last night.     PHYSICAL EXAM Filed Vitals:   03/14/12 0300 03/14/12 0359 03/14/12 0400 03/14/12 0500  BP: 114/50  115/56 102/58  Pulse: 74 72 72 75  Temp:      TempSrc:      Resp: 39 29 36 36  Height:      Weight:      SpO2: 96% 97% 96% 96%   General:  No acute distress Lungs:  Decreased breath sounds with few basilar crackles Heart:  RRR, murmur unchanged Abdomen:  Positive bowel sounds, no rebound no guarding Extremities:  No edema.    LABS: Lab Results  Component Value Date   CKTOTAL 220* 11/15/2011   CKMB 13.3* 11/15/2011   TROPONINI <0.30 03/13/2012   Results for orders placed during the hospital encounter of 03/11/12 (from the past 24 hour(s))  TROPONIN I     Status: Normal   Collection Time   03/13/12 10:30 AM      Component Value Range   Troponin I <0.30  <0.30 ng/mL  GLUCOSE, CAPILLARY     Status: Abnormal   Collection Time   03/13/12 11:59 AM      Component Value Range   Glucose-Capillary 121 (*) 70 - 99 mg/dL  GLUCOSE, CAPILLARY     Status: Normal   Collection Time   03/13/12  3:46 PM      Component Value Range   Glucose-Capillary 94  70 - 99 mg/dL  GLUCOSE, CAPILLARY     Status: Abnormal   Collection Time   03/13/12  8:09 PM      Component Value Range   Glucose-Capillary 107 (*) 70 - 99 mg/dL  CBC WITH DIFFERENTIAL     Status: Abnormal   Collection Time   03/13/12  9:00 PM      Component Value Range   WBC 10.4  4.0 - 10.5 K/uL   RBC 3.49 (*) 3.87 - 5.11 MIL/uL   Hemoglobin 9.0 (*) 12.0 - 15.0 g/dL   HCT 11.9 (*) 14.7 - 82.9 %   MCV 83.4  78.0 - 100.0 fL   MCH 25.8 (*) 26.0 - 34.0 pg   MCHC 30.9  30.0 - 36.0 g/dL   RDW 56.2 (*) 13.0 - 86.5 %   Platelets 188  150 - 400 K/uL   Neutrophils Relative 83 (*) 43 - 77 %   Neutro Abs 8.6 (*) 1.7 - 7.7 K/uL   Lymphocytes  Relative 8 (*) 12 - 46 %   Lymphs Abs 0.9  0.7 - 4.0 K/uL   Monocytes Relative 7  3 - 12 %   Monocytes Absolute 0.8  0.1 - 1.0 K/uL   Eosinophils Relative 2  0 - 5 %   Eosinophils Absolute 0.2  0.0 - 0.7 K/uL   Basophils Relative 0  0 - 1 %   Basophils Absolute 0.0  0.0 - 0.1 K/uL  PRO B NATRIURETIC PEPTIDE     Status: Abnormal   Collection Time   03/13/12  9:00 PM      Component Value Range   Pro B Natriuretic peptide (BNP) 1314.0 (*) 0 - 125 pg/mL  BASIC METABOLIC PANEL     Status: Abnormal   Collection Time   03/13/12  9:00 PM      Component Value Range  Sodium 144  135 - 145 mEq/L   Potassium 4.2  3.5 - 5.1 mEq/L   Chloride 117 (*) 96 - 112 mEq/L   CO2 21  19 - 32 mEq/L   Glucose, Bld 111 (*) 70 - 99 mg/dL   BUN 22  6 - 23 mg/dL   Creatinine, Ser 4.09 (*) 0.50 - 1.10 mg/dL   Calcium 9.8  8.4 - 81.1 mg/dL   GFR calc non Af Amer 28 (*) >90 mL/min   GFR calc Af Amer 32 (*) >90 mL/min  PROCALCITONIN     Status: Normal   Collection Time   03/13/12  9:00 PM      Component Value Range   Procalcitonin 11.88    POCT I-STAT 3, BLOOD GAS (G3+)     Status: Abnormal   Collection Time   03/13/12  9:01 PM      Component Value Range   pH, Arterial 7.282 (*) 7.350 - 7.450   pCO2 arterial 47.0 (*) 35.0 - 45.0 mmHg   pO2, Arterial 71.0 (*) 80.0 - 100.0 mmHg   Bicarbonate 22.1  20.0 - 24.0 mEq/L   TCO2 23  0 - 100 mmol/L   O2 Saturation 91.0     Acid-base deficit 4.0 (*) 0.0 - 2.0 mmol/L   Patient temperature 99.4 F     Collection site ARTERIAL LINE     Drawn by Operator     Sample type ARTERIAL    GLUCOSE, CAPILLARY     Status: Abnormal   Collection Time   03/14/12 12:01 AM      Component Value Range   Glucose-Capillary 122 (*) 70 - 99 mg/dL  PROTIME-INR     Status: Abnormal   Collection Time   03/14/12  4:17 AM      Component Value Range   Prothrombin Time 32.6 (*) 11.6 - 15.2 seconds   INR 3.42 (*) 0.00 - 1.49  BASIC METABOLIC PANEL     Status: Abnormal   Collection Time    03/14/12  4:17 AM      Component Value Range   Sodium 141  135 - 145 mEq/L   Potassium 4.8  3.5 - 5.1 mEq/L   Chloride 110  96 - 112 mEq/L   CO2 23  19 - 32 mEq/L   Glucose, Bld 166 (*) 70 - 99 mg/dL   BUN 28 (*) 6 - 23 mg/dL   Creatinine, Ser 9.14 (*) 0.50 - 1.10 mg/dL   Calcium 78.2 (*) 8.4 - 10.5 mg/dL   GFR calc non Af Amer 22 (*) >90 mL/min   GFR calc Af Amer 26 (*) >90 mL/min  CBC     Status: Abnormal   Collection Time   03/14/12  4:17 AM      Component Value Range   WBC 9.7  4.0 - 10.5 K/uL   RBC 3.62 (*) 3.87 - 5.11 MIL/uL   Hemoglobin 9.3 (*) 12.0 - 15.0 g/dL   HCT 95.6 (*) 21.3 - 08.6 %   MCV 83.7  78.0 - 100.0 fL   MCH 25.7 (*) 26.0 - 34.0 pg   MCHC 30.7  30.0 - 36.0 g/dL   RDW 57.8 (*) 46.9 - 62.9 %   Platelets 203  150 - 400 K/uL  MAGNESIUM     Status: Normal   Collection Time   03/14/12  4:17 AM      Component Value Range   Magnesium 1.9  1.5 - 2.5 mg/dL    Intake/Output Summary (  Last 24 hours) at 03/14/12 0981 Last data filed at 03/14/12 0600  Gross per 24 hour  Intake 1672.63 ml  Output   2270 ml  Net -597.37 ml    ASSESSMENT AND PLAN:  Acute on chronic diastolic CHF/Hypertensive Emergency/Acute Pulmonary Edema/HOCM: Multifactorial respiratory failure.  Unable to diurese further with rising creat.  Continue plans per pulmonary.    Chronic Renal Failure - Creat up to 2.13  Atrial fib -  Maintaining NSR.  On warfarin.  I will restart amiodarone.    HTN - BP currently OK.  Started beta blocker yesterday.     [name] [facility] 03/14/2012 6:52 AM

## 2012-03-14 NOTE — Progress Notes (Signed)
Name: [name] MRN: [id number] DOB: 01-01-1941    LOS: 3  REFERRING PRIVIDER:  Zadie Rhine CHIEF COMPLAINT:  Respiratory failure   BRIEF PATIENT DESCRIPTION:  71 yo female former smoker admitted 03/11/2012 with weakness and sudden onset of dyspnea from acute pulmonary edema requiring intubation and with acute encephalopathy with SBP of 220.  Developed bradycardia/hypotension after intubation.    Significant PMHx CAD, A fib, Diastolic CHF, HOCM, HTN, CKD stage 4, Anemia   LINES / TUBES: ETT 11/02>>11/04 Rt IJ CVL 11/02>>11/05 Rt radial aline 11/02>>11/04  CULTURES: Urine 11/02>>negative Sputum 11/02>>Moderate S aureus>>  ANTIBIOTICS: Vancomycin 11/05>> Rocephin 11/05>>  SIGNIFICANT EVENTS:  11/05 Respiratory distress after extubation>>?improvement from diuresis and/or neb tx  LEVEL OF CARE:  ICU PRIMARY SERVICE:  PCCM CONSULTANTS:  [facility] CODE STATUS: Full DIET:  Cardiac diet DVT Px:  Chronic coumadin GI Px:  Protonix  INTERVAL HISTORY:  Respiratory distress overnight.  Improved this morning.  Has cough with sputum.  Denies chest/abd pain.  VITAL SIGNS: Temp:  [97.4 F (36.3 C)-99.9 F (37.7 C)] 97.4 F (36.3 C) (11/05 0809) Pulse Rate:  [70-107] 72  (11/05 0900) Resp:  [15-43] 36  (11/05 0800) BP: (94-145)/(19-82) 128/62 mmHg (11/05 0900) SpO2:  [94 %-100 %] 96 % (11/05 0900) Arterial Line BP: (66-156)/(50-141) 87/78 mmHg (11/04 2200) FiO2 (%):  [30 %-39.9 %] 30 % (11/04 2145) Weight:  [187 lb 6.3 oz (85 kg)] 187 lb 6.3 oz (85 kg) (11/05 0658) CVP 12  PHYSICAL EXAMINATION: General:  No distress Neuro:  Follows commands, moves all extremities HEENT:  ETT, OG tube in place Cardiovascular:  s1s2 regular, no murmur Lungs:  No wheeze, scattered rhonchi Abdomen:  Soft, non tender Musculoskeletal:  No edema Skin:  No rashes  DIAGNOSES: Principal Problem:  *Acute respiratory failure with hypoxia   ASSESSMENT /  PLAN: PULMONARY  Lab 03/13/12 2101 03/13/12 0247 03/12/12 0433  PHART 7.282* 7.375 7.340*  PCO2ART 47.0* 35.8 41.5  PO2ART 71.0* 92.6 96.0  HCO3 22.1 20.4 21.8  O2SAT 91.0 98.4 96.9    CXR:  11/04 >>decreased PVC and basilar ATX/ASD   A:  Acute respiratory failure from acute pulmonary edema. No prior hx of obstructive lung disease>>?if wheezing 11/04 after extubation from cardiac asthma vs COPD. P:   Keep in even to negative fluid balance Adjust oxygen to keep SpO2 > 92% F/u CXR 11/06 Change albuterol/atrovent to prn D/c solumedrol Gradually wean off prednisone May need outpt evaluation for COPD, and OSA  CARDIOVASCULAR  Lab 03/13/12 2100 03/11/12 1931  LATICACIDVEN -- --  O2SATVEN -- --  PROBNP 1314.0* 2010.0*    A: Initially with HTN emergency causing acute pulmonary edema. Hypotension, bradycardia after intubation. Resolved 11/04. Hx of A fib, CAD, diastolic CHF, HOCM, hyperlipidemia. Appreciate help from cardiology P:  D/c central line Negative fluid balance Continue atorvastatin, amiodarone, lopressor  RENAL  Lab 03/14/12 0417 03/13/12 2100 03/13/12 0337 03/12/12 0400  NA 141 144 139 --  K 4.8 4.2 5.1 --  CL 110 117* 111 --  CO2 23 21 21  --  BUN 28* 22 27* --  CREATININE 2.13* 1.77* 1.99* --  CALCIUM 11.2* 9.8 11.3* --  MG 1.9 -- -- 1.7  PHOS -- -- -- 2.8   Intake/Output      11/04 0701 - 11/05 0700 11/05 0701 - 11/06 0700   P.O. 300 120   I.V. (mL/kg) 839.3 (9.9) 40 (0.5)   IV Piggyback 500    Total Intake(mL/kg)  1639.3 (19.3) 160 (1.9)   Urine (mL/kg/hr) 2395 (1.2) 60   Emesis/NG output     Total Output 2395 60   Net -755.7 +100          A:  CKD stage 4. Hypokalemia. P:   Monitor renal fx, urine outpt, electrolytes D/c foley  GASTROINTESTINAL  Lab 03/13/12 0337  AST 23  ALT 12  ALKPHOS 120*  PROT 6.7  ALBUMIN 2.8*    A: Nutrition. P:   Continue diet  HEMATOLOGIC  Lab 03/14/12 0417 03/13/12 2100 03/13/12 0337 03/12/12  1935  HGB 9.3* 9.0* 10.1* --  HCT 30.3* 29.1* 30.7* --  PLT 203 188 200 --  INR 3.42* -- 2.56* 2.35*  APTT -- -- -- --    A:  Chronic coumadin tx for A fib. Anemia. P:  F/u CBC Coumadin per pharmacy  INFECTIOUS  Lab 03/14/12 0417 03/13/12 2100 03/13/12 0337 03/12/12 0400  PROCALCITON 14.33 11.88 -- --  WBC 9.7 10.4 12.6* 17.8*  LATICACIDVEN -- -- -- --    A:  Staph aureus tracheobronchitis. P:   Continue vancomycin, and add rocephin pending culture results  ENDOCRINE CBG  Lab 03/14/12 0001 03/13/12 2009 03/13/12 1546 03/13/12 1159 03/13/12 0346  GLUCAP 122* 107* 94 121* 127*    A:  Hyperglycemia.   No prior hx of DM.  Likely related to acute stress. P:   Monitor blood sugars on BMET.  NEUROLOGIC  A:  Acute encephalopathy from HTN emergency and acute hypoxic respiratory failure. Resolved 11/05. P:   Monitor mental status.   Summary: Former smoker.  ?if wheezing after extubation from COPD/asthma vs cardiac asthma.  Will get PT to evaluate.  Transfer to Telemetry.  Keep on PCCM service as likely ready for d/c home soon.  Coralyn Helling, MD [facility] Pulmonary/Critical Care 03/14/2012, 9:33 AM Pager:  [phone] After 3pm call: [phone]

## 2012-03-15 ENCOUNTER — Inpatient Hospital Stay (HOSPITAL_COMMUNITY): Payer: Medicare Other

## 2012-03-15 DIAGNOSIS — I5032 Chronic diastolic (congestive) heart failure: Secondary | ICD-10-CM

## 2012-03-15 LAB — CBC
HCT: 29.1 % — ABNORMAL LOW (ref 36.0–46.0)
Hemoglobin: 9.1 g/dL — ABNORMAL LOW (ref 12.0–15.0)
MCH: 25.7 pg — ABNORMAL LOW (ref 26.0–34.0)
MCHC: 31.3 g/dL (ref 30.0–36.0)
MCV: 82.2 fL (ref 78.0–100.0)

## 2012-03-15 LAB — BASIC METABOLIC PANEL
BUN: 39 mg/dL — ABNORMAL HIGH (ref 6–23)
Chloride: 107 mEq/L (ref 96–112)
Creatinine, Ser: 2.07 mg/dL — ABNORMAL HIGH (ref 0.50–1.10)
Glucose, Bld: 167 mg/dL — ABNORMAL HIGH (ref 70–99)
Potassium: 4 mEq/L (ref 3.5–5.1)

## 2012-03-15 LAB — CULTURE, RESPIRATORY W GRAM STAIN

## 2012-03-15 MED ORDER — SODIUM CHLORIDE 0.9 % IV SOLN: 250.0000 mL | INTRAVENOUS | Status: DC | PRN

## 2012-03-15 MED ORDER — CEFAZOLIN SODIUM-DEXTROSE 2-3 GM-% IV SOLR
2.0000 g | Freq: Two times a day (BID) | INTRAVENOUS | Status: DC
  Filled 2012-03-15: qty 50

## 2012-03-15 MED ORDER — CEFAZOLIN SODIUM-DEXTROSE 2-3 GM-% IV SOLR
2.0000 g | Freq: Two times a day (BID) | INTRAVENOUS | Status: DC
  Administered 2012-03-16: 2 g via INTRAVENOUS
  Filled 2012-03-15 (×2): qty 50

## 2012-03-15 MED ORDER — ALBUTEROL SULFATE (5 MG/ML) 0.5% IN NEBU
2.5000 mg | INHALATION_SOLUTION | Freq: Four times a day (QID) | RESPIRATORY_TRACT | Status: DC
  Administered 2012-03-15 – 2012-03-17 (×9): 2.5 mg via RESPIRATORY_TRACT
  Filled 2012-03-15 (×10): qty 0.5

## 2012-03-15 MED ORDER — IPRATROPIUM BROMIDE 0.02 % IN SOLN
0.5000 mg | Freq: Four times a day (QID) | RESPIRATORY_TRACT | Status: DC
  Administered 2012-03-15 – 2012-03-17 (×9): 0.5 mg via RESPIRATORY_TRACT
  Filled 2012-03-15 (×9): qty 2.5

## 2012-03-15 NOTE — Evaluation (Signed)
Physical Therapy Evaluation Patient Details Name: [name] MRN: [id number] DOB: May 25, 1940 Today's Date: 03/15/2012 Time: 4098-1191 PT Time Calculation (min): 28 min  PT Assessment / Plan / Recommendation Clinical Impression  pt adm with hypoxic resp. failure with pulmonary effusions afib and diastolic HF.  On eval, pt displayed mild weakness and decr activity tolerance.  Will see on acute until D/C.    PT Assessment  Patient needs continued PT services    Follow Up Recommendations  No PT follow up    Does the patient have the potential to tolerate intense rehabilitation      Barriers to Discharge None      Equipment Recommendations  None recommended by PT    Recommendations for Other Services     Frequency Min 3X/week    Precautions / Restrictions Precautions Precautions: None Restrictions Weight Bearing Restrictions: No   Pertinent Vitals/Pain       Mobility  Bed Mobility Bed Mobility: Not assessed Transfers Transfers: Sit to Stand;Stand to Sit;Squat Pivot Transfers Sit to Stand: 5: Supervision Stand to Sit: 5: Supervision Squat Pivot Transfers: 4: Min guard Details for Transfer Assistance: vc's for hand placement; close stand by for safety Ambulation/Gait Ambulation/Gait Assistance: 4: Min guard Ambulation Distance (Feet): 150 Feet Assistive device: Rolling walker Ambulation/Gait Assistance Details: generally steady, though fatigues quickly Gait Pattern: Step-through pattern;Decreased step length - right;Decreased step length - left;Decreased stance time - left Stairs: No    Shoulder Instructions     Exercises     PT Diagnosis: Generalized weakness;Other (comment) (decr activity tolerance)  PT Problem List: Decreased strength;Decreased activity tolerance PT Treatment Interventions: Gait training;Functional mobility training;Therapeutic activities;Patient/family education   PT Goals Acute Rehab PT Goals PT Goal Formulation: With patient Time  For Goal Achievement: 03/22/12 Potential to Achieve Goals: Good Pt will go Supine/Side to Sit: Independently PT Goal: Supine/Side to Sit - Progress: Goal set today Pt will go Sit to Stand: Independently PT Goal: Sit to Stand - Progress: Goal set today Pt will Transfer Bed to Chair/Chair to Bed: Independently PT Transfer Goal: Bed to Chair/Chair to Bed - Progress: Goal set today Pt will Ambulate: >150 feet;with modified independence;with least restrictive assistive device PT Goal: Ambulate - Progress: Goal set today  Visit Information  Last PT Received On: 03/15/12 Assistance Needed: +1    Subjective Data  Subjective: I haven't given you permission to walk with me yet. Patient Stated Goal: Home I   Prior Functioning  Home Living Lives With: Daughter Available Help at Discharge: Family Type of Home: Apartment Home Access: Level entry Home Layout: One level;Other (Comment) (on 6th floor) Bathroom Shower/Tub: Tub/shower unit;Curtain Bathroom Toilet: Handicapped height Home Adaptive Equipment: Environmental consultant - four wheeled Prior Function Level of Independence: Independent (rollator for longer distances) Communication Communication: No difficulties    Cognition  Overall Cognitive Status: Appears within functional limits for tasks assessed/performed Arousal/Alertness: Awake/alert Orientation Level: Appears intact for tasks assessed Behavior During Session: [facility] for tasks performed    Extremity/Trunk Assessment Right Lower Extremity Assessment RLE ROM/Strength/Tone: [facility] for tasks assessed Left Lower Extremity Assessment LLE ROM/Strength/Tone: [facility] for tasks assessed Trunk Assessment Trunk Assessment: Normal   Balance Balance Balance Assessed: No  End of Session PT - End of Session Activity Tolerance: Patient tolerated treatment well;Patient limited by fatigue Patient left: Other (comment);with call bell/phone within reach (sitting EOB) Nurse Communication: Mobility status  GP      Ketzaly Cardella, Eliseo Gum 03/15/2012, 1:36 PM  03/15/2012  [place], PT [phone] (pager)

## 2012-03-15 NOTE — Progress Notes (Signed)
ANTICOAGULATION CONSULT NOTE - Follow Up Consult  Pharmacy Consult for Coumadin Indication: atrial fibrillation  No Known Allergies  Patient Measurements: Height: 5\' 3"  (160 cm) Weight: 185 lb 6.4 oz (84.097 kg) IBW/kg (Calculated) : 52.4   Vital Signs: Temp: 98.4 F (36.9 C) (11/06 0456) Temp src: Oral (11/06 0456) BP: 115/73 mmHg (11/06 0456) Pulse Rate: 61  (11/06 0456)  Labs:  Basename 03/15/12 0500 03/14/12 0417 03/13/12 2100 03/13/12 1030 03/13/12 0337  HGB 9.1* 9.3* -- -- --  HCT 29.1* 30.3* 29.1* -- --  PLT 219 203 188 -- --  APTT -- -- -- -- --  LABPROT 38.0* 32.6* -- -- 26.3*  INR 4.21* 3.42* -- -- 2.56*  HEPARINUNFRC -- -- -- -- --  CREATININE 2.07* 2.13* 1.77* -- --  CKTOTAL -- -- -- -- --  CKMB -- -- -- -- --  TROPONINI -- -- -- <0.30 --    Estimated Creatinine Clearance: 25.6 ml/min (by C-G formula based on Cr of 2.07).  Assessment: [name] on coumadin for afib who was previously resumed on home dose (home dose. 2.5 MWF, 5mg  TTSS; verified with [facility] notes and [facility]), INR was stable and and therapeutic on 11/3 and 11/4, but big jump to 3.42 11/5, and now is 4.21 despite no coumadin yesterday. Resumed home po amiodarone 11/5. Hgb and plt has been stable, no bleeding per chart.  Goal of Therapy:  INR 2-3 Monitor platelets by anticoagulation protocol: Yes   Plan:  - Hold coumadin today due to supratherapeutic INR - monitor daily INR  Lauranne Beyersdorf C 03/15/2012,9:05 AM

## 2012-03-15 NOTE — Progress Notes (Signed)
Name: [name] MRN: [id number] DOB: [birth year]    LOS: 4  REFERRING PRIVIDER:  Zadie Rhine (EDP) CHIEF COMPLAINT:  Respiratory failure   BRIEF PATIENT DESCRIPTION:  71 yo female former smoker admitted 03/11/2012 with weakness and sudden onset of dyspnea from acute pulmonary edema requiring intubation and with acute encephalopathy with SBP of 220.  Developed bradycardia/hypotension after intubation.    Significant PMHx CAD, A fib, Diastolic CHF, HOCM, HTN, CKD stage 4, Anemia   LINES / TUBES: ETT 11/02>>11/04 Rt IJ CVL 11/02>>11/05 Rt radial aline 11/02>>11/04  CULTURES: Urine 11/02>>negative Sputum 11/02>>Moderate S aureus>>  ANTIBIOTICS: Vancomycin 11/05>> Rocephin 11/05>>  SIGNIFICANT EVENTS:  11/05 Respiratory distress after extubation>>?improvement from diuresis and/or neb tx  LEVEL OF CARE:  ICU PRIMARY SERVICE:  PCCM CONSULTANTS:  [facility] CODE STATUS: Full DIET:  Cardiac diet DVT Px:  Chronic coumadin GI Px:  Protonix  INTERVAL HISTORY:  No acute change overnight.  C/o slightly increased wheeze.    VITAL SIGNS: Temp:  [97.4 F (36.3 C)-98.4 F (36.9 C)] 98.4 F (36.9 C) (11/06 0456) Pulse Rate:  [61-77] 61  (11/06 0456) Resp:  [20-31] 20  (11/06 0456) BP: (101-152)/(50-73) 115/73 mmHg (11/06 0456) SpO2:  [95 %-100 %] 100 % (11/06 0456) Weight:  [185 lb 6.4 oz (84.097 kg)] 185 lb 6.4 oz (84.097 kg) (11/06 0456)   PHYSICAL EXAMINATION: General:  No distress Neuro:  Sleeping, arousable but wants to go right back to sleep, Follows commands, moves all extremities HEENT: mm dry, no JVD Cardiovascular:  s1s2 regular, no murmur Lungs:  resps even non labored, + exp wheeze  Abdomen:  Soft, non tender Musculoskeletal:  No edema Skin:  No rashes  DIAGNOSES: Principal Problem:  *Acute respiratory failure with hypoxia   ASSESSMENT / PLAN: PULMONARY  Lab 03/13/12 2101 03/13/12 0247 03/12/12 0433  PHART 7.282* 7.375 7.340*  PCO2ART  47.0* 35.8 41.5  PO2ART 71.0* 92.6 96.0  HCO3 22.1 20.4 21.8  O2SAT 91.0 98.4 96.9    CXR:  11/06 >> slight increased edema  A:  Acute respiratory failure from acute pulmonary edema. No prior hx of obstructive lung disease>>?if wheezing 11/04 after extubation from cardiac asthma vs COPD. P:   Keep in even to negative fluid balance Adjust oxygen to keep SpO2 > 92% F/u CXR 11/06 Change albuterol/atrovent back to scheduled with worsening wheeze  Cont prednisone with taper  May need outpt evaluation for COPD, and OSA pulm hygiene   CARDIOVASCULAR  Lab 03/13/12 2100 03/11/12 1931  LATICACIDVEN -- --  O2SATVEN -- --  PROBNP 1314.0* 2010.0*    A: Initially with HTN emergency causing acute pulmonary edema. Hypotension, bradycardia after intubation.- Resolved 11/04. Hx of A fib, CAD, diastolic CHF, HOCM, hyperlipidemia. Appreciate help from cardiology P:  Negative fluid balance Continue atorvastatin, lopressor Cont scheduled PO lasix  Amiodarone restarted by cards Consider additional diuresis with even balance (goal neg) and slightly increased edema on CXR - will hold for now with Scr up  RENAL  Lab 03/15/12 0500 03/14/12 0417 03/13/12 2100 03/12/12 0400  NA 139 141 144 --  K 4.0 4.8 4.2 --  CL 107 110 117* --  CO2 23 23 21  --  BUN 39* 28* 22 --  CREATININE 2.07* 2.13* 1.77* --  CALCIUM 10.9* 11.2* 9.8 --  MG -- 1.9 -- 1.7  PHOS -- -- -- 2.8    Intake/Output Summary (Last 24 hours) at 03/15/12 0853 Last data filed at 03/15/12 0208  Gross per 24 hour  Intake    590 ml  Output    590 ml  Net      0 ml     A:  CKD stage 4. Hypokalemia. P:   Monitor renal fx, urine outpt, electrolytes D/c foley  GASTROINTESTINAL  Lab 03/13/12 0337  AST 23  ALT 12  ALKPHOS 120*  PROT 6.7  ALBUMIN 2.8*    A: Nutrition. P:   Continue diet  HEMATOLOGIC  Lab 03/15/12 0500 03/14/12 0417 03/13/12 2100 03/13/12 0337  HGB 9.1* 9.3* 9.0* --  HCT 29.1* 30.3* 29.1* --  PLT  219 203 188 --  INR 4.21* 3.42* -- 2.56*  APTT -- -- -- --    A:  Chronic coumadin tx for A fib. Anemia. P:  F/u CBC Coumadin per pharmacy -held 11/4 and 11/5 r/t supra therapeutic   INFECTIOUS  Lab 03/15/12 0500 03/14/12 0417 03/13/12 2100 03/13/12 0337  PROCALCITON -- 14.33 11.88 --  WBC 14.3* 9.7 10.4 12.6*  LATICACIDVEN -- -- -- --    A:  Staph aureus tracheobronchitis. - sensitivities pending  P:   Continue vancomycin, and add rocephin pending culture results  ENDOCRINE CBG  Lab 03/14/12 1215 03/14/12 0001 03/13/12 2009 03/13/12 1546 03/13/12 1159  GLUCAP 185* 122* 107* 94 121*    A:  Hyperglycemia.   No prior hx of DM.  Likely related to acute stress. P:   Monitor blood sugars on BMET.  NEUROLOGIC  A:  Acute encephalopathy from HTN emergency and acute hypoxic respiratory failure. Resolved 11/05. P:   Monitor mental status.   Summary: Former smoker.  ?if wheezing after extubation from COPD/asthma vs cardiac asthma.  Increased wheeze this am.  May need to consider further diuresis.  Will change nebs back to scheduled for now. D/c central line if PIV access obtained.   Awaiting PT eval. Keep on PCCM service as likely ready for d/c home soon.  Danford Bad, NP 03/15/2012  8:46 AM Pager: [phone] or [phone]  *Care during the described time interval was provided by me and/or other providers on the critical care team. I have reviewed this patient's available data, including medical history, events of note, physical examination and test results as part of my evaluation.  Dorcas Carrow Beeper  [phone]  Cell  [phone]  If no response or cell goes to voicemail, call beeper [phone]

## 2012-03-15 NOTE — Progress Notes (Addendum)
ANTIBIOTIC CONSULT NOTE - INITIAL  Pharmacy Consult for transition vancomycin/Ceftriaxone to Ancef Indication: MSSA, strep pneumonia  No Known Allergies  Patient Measurements: Height: 5\' 3"  (160 cm) Weight: 185 lb 6.4 oz (84.097 kg) IBW/kg (Calculated) : 52.4   Vital Signs: Temp: 98.4 F (36.9 C) (11/06 0456) Temp src: Oral (11/06 0456) BP: 110/68 mmHg (11/06 1028) Pulse Rate: 62  (11/06 1028) Intake/Output from previous day: 11/05 0701 - 11/06 0700 In: 610 [P.O.:480; I.V.:80; IV Piggyback:50] Out: 610 [Urine:610] Intake/Output from this shift:    Labs:  Basename 03/15/12 0500 03/14/12 0417 03/13/12 2100  WBC 14.3* 9.7 10.4  HGB 9.1* 9.3* 9.0*  PLT 219 203 188  LABCREA -- -- --  CREATININE 2.07* 2.13* 1.77*   Estimated Creatinine Clearance: 25.6 ml/min (by C-G formula based on Cr of 2.07).  Medical History: Past Medical History  Diagnosis Date  . CAD (coronary artery disease)     cath 11/11: LAD 40%, mid-dist 25-30%; prox CFX 30%, mid 40%; OM 50%; PDA 50%; PLV 50%;  EF 65%  . Acute myocardial infarction     in setting of AFib with RVR 03/2010  . Atrial fibrillation     DCCV 11/17/11; coumadin & amiodarone  . HOCM (hypertrophic obstructive cardiomyopathy)     Echo 11/2011:severe LVH, EF 65-70%, mid-cavity gradient up to  . Hypertension   . Tobacco abuse   . Chronic kidney disease     Stage 4  . CHF (congestive heart failure)     preserved LVF  . Iron deficiency anemia   . Third degree uterine prolapse   . Hx MRSA infection     Medications:  Prescriptions prior to admission  Medication Sig Dispense Refill  . amiodarone (PACERONE) 200 MG tablet Take 200 mg by mouth daily. Take 200mg  daily starting 12/16/11      . furosemide (LASIX) 40 MG tablet Take 40 mg by mouth daily.      . metoprolol tartrate (LOPRESSOR) 25 MG tablet Take 25 mg by mouth 2 (two) times daily.      . nitroGLYCERIN (NITROSTAT) 0.4 MG SL tablet Place 0.4 mg under the tongue every 5  (five) minutes as needed. For chest pain      . pantoprazole (PROTONIX) 40 MG tablet Take 40 mg by mouth daily.      . rosuvastatin (CRESTOR) 10 MG tablet Take 10 mg by mouth at bedtime.       Marland Kitchen warfarin (COUMADIN) 5 MG tablet Take 2.5-5 mg by mouth daily. 2.5 mg Monday, Wednesday, Friday; 5mg  Tuesday, Thursday, Saturday, Sunday       Results for orders placed during the hospital encounter of 03/11/12  URINE CULTURE     Status: Normal   Collection Time   03/11/12  8:12 PM      Component Value Range Status Comment   Specimen Description URINE, CATHETERIZED   Final    Special Requests ADDED ON 03/12/12 AT 0842   Final    Culture  Setup Time 03/12/2012 16:00   Final    Colony Count NO GROWTH   Final    Culture NO GROWTH   Final    Report Status 03/13/2012 FINAL   Final   MRSA PCR SCREENING     Status: Normal   Collection Time   03/11/12 11:53 PM      Component Value Range Status Comment   MRSA by PCR NEGATIVE  NEGATIVE Final   CULTURE, RESPIRATORY     Status: Normal  Collection Time   03/12/12  6:49 PM      Component Value Range Status Comment   Specimen Description TRACHEAL ASPIRATE   Final    Special Requests NONE   Final    Gram Stain     Final    Value: RARE WBC PRESENT, PREDOMINANTLY PMN     NO SQUAMOUS EPITHELIAL CELLS SEEN     RARE GRAM POSITIVE COCCI     IN PAIRS   Culture     Final    Value: MODERATE STAPHYLOCOCCUS AUREUS     Note: RIFAMPIN AND GENTAMICIN SHOULD NOT BE USED AS SINGLE DRUGS FOR TREATMENT OF STAPH INFECTIONS.     MODERATE STREPTOCOCCUS,BETA HEMOLYIC NOT GROUP A   Report Status 03/15/2012 FINAL   Final    Organism ID, Bacteria STAPHYLOCOCCUS AUREUS   Final    Assessment: 71 yo female with WBC =12.6 and possible infiltrate on CXR.  Trach aspirate growing MSSA and moderate strep.  Currently on vancomycin, however cefazolin would be preferred for better coverage and narrower spectrum.  Spoke with Dr. Molli Knock, OK to change to cefazolin.  CrCl ~ 25 ml/min  Goal  of Therapy:  Resolution of infection.  Plan:  -D/C vancomycin and ceftriaxone -Cefazolin 2g IV q12 hrs. -Pharmacy will follow antibiotics peripherally. Thanks!  Reece Leader, Pharm D 03/15/2012 12:46 PM

## 2012-03-15 NOTE — Progress Notes (Signed)
SUBJECTIVE:  Breathing much better this am.  No acute SOB or cough.   PHYSICAL EXAM Filed Vitals:   03/14/12 1300 03/14/12 1330 03/14/12 2122 03/15/12 0456  BP: 152/68 127/71 105/50 115/73  Pulse: 77 65 64 61  Temp:  97.4 F (36.3 C) 98.1 F (36.7 C) 98.4 F (36.9 C)  TempSrc:  Oral Oral Oral  Resp: 27 21 20 20   Height:      Weight:    185 lb 6.4 oz (84.097 kg)  SpO2: 98% 98% 100% 100%   General:  No acute distress Lungs:  Decreased breath sounds with few basilar crackles Heart:  RRR, murmur unchanged Abdomen:  Positive bowel sounds, no rebound no guarding Extremities:  No edema.    LABS: Lab Results  Component Value Date   CKTOTAL 220* 11/15/2011   CKMB 13.3* 11/15/2011   TROPONINI <0.30 03/13/2012   Results for orders placed during the hospital encounter of 03/11/12 (from the past 24 hour(s))  GLUCOSE, CAPILLARY     Status: Abnormal   Collection Time   03/14/12 12:15 PM      Component Value Range   Glucose-Capillary 185 (*) 70 - 99 mg/dL   Comment 1 Documented in Chart     Comment 2 Notify RN    PROTIME-INR     Status: Abnormal   Collection Time   03/15/12  5:00 AM      Component Value Range   Prothrombin Time 38.0 (*) 11.6 - 15.2 seconds   INR 4.21 (*) 0.00 - 1.49  BASIC METABOLIC PANEL     Status: Abnormal   Collection Time   03/15/12  5:00 AM      Component Value Range   Sodium 139  135 - 145 mEq/L   Potassium 4.0  3.5 - 5.1 mEq/L   Chloride 107  96 - 112 mEq/L   CO2 23  19 - 32 mEq/L   Glucose, Bld 167 (*) 70 - 99 mg/dL   BUN 39 (*) 6 - 23 mg/dL   Creatinine, Ser 1.61 (*) 0.50 - 1.10 mg/dL   Calcium 09.6 (*) 8.4 - 10.5 mg/dL   GFR calc non Af Amer 23 (*) >90 mL/min   GFR calc Af Amer 27 (*) >90 mL/min  CBC     Status: Abnormal   Collection Time   03/15/12  5:00 AM      Component Value Range   WBC 14.3 (*) 4.0 - 10.5 K/uL   RBC 3.54 (*) 3.87 - 5.11 MIL/uL   Hemoglobin 9.1 (*) 12.0 - 15.0 g/dL   HCT 04.5 (*) 40.9 - 81.1 %   MCV 82.2  78.0 - 100.0 fL     MCH 25.7 (*) 26.0 - 34.0 pg   MCHC 31.3  30.0 - 36.0 g/dL   RDW 91.4 (*) 78.2 - 95.6 %   Platelets 219  150 - 400 K/uL    Intake/Output Summary (Last 24 hours) at 03/15/12 0647 Last data filed at 03/15/12 0208  Gross per 24 hour  Intake    630 ml  Output    735 ml  Net   -105 ml    ASSESSMENT AND PLAN:  Acute on chronic diastolic CHF/Hypertensive Emergency/Acute Pulmonary Edema/HOCM: Multifactorial respiratory failure.  She seems to have improved with steroids and other pulmonary treatment.  Continue current therapy.    Chronic Renal Failure - Creat stable at 2.07  Atrial fib -  Maintaining NSR.  On warfarin.  I restarted amiodarone.  HTN - BP currently OK.  Continue current medications.   Fayrene Fearing [facility] 03/15/2012 6:47 AM

## 2012-03-16 LAB — BASIC METABOLIC PANEL
BUN: 35 mg/dL — ABNORMAL HIGH (ref 6–23)
CO2: 26 mEq/L (ref 19–32)
Chloride: 106 mEq/L (ref 96–112)
Creatinine, Ser: 1.88 mg/dL — ABNORMAL HIGH (ref 0.50–1.10)
Glucose, Bld: 159 mg/dL — ABNORMAL HIGH (ref 70–99)

## 2012-03-16 MED ORDER — WARFARIN - PHARMACIST DOSING INPATIENT: Freq: Every day | Status: DC

## 2012-03-16 MED ORDER — AMOXICILLIN-POT CLAVULANATE 500-125 MG PO TABS
1.0000 | ORAL_TABLET | Freq: Three times a day (TID) | ORAL | Status: DC
  Administered 2012-03-16 – 2012-03-17 (×3): 500 mg via ORAL
  Filled 2012-03-16 (×6): qty 1

## 2012-03-16 MED ORDER — WARFARIN SODIUM 1 MG PO TABS
1.0000 mg | ORAL_TABLET | Freq: Once | ORAL | Status: DC
  Filled 2012-03-16: qty 1

## 2012-03-16 MED ORDER — FUROSEMIDE 80 MG PO TABS
80.0000 mg | ORAL_TABLET | Freq: Every day | ORAL | Status: DC
  Administered 2012-03-16 – 2012-03-17 (×2): 80 mg via ORAL
  Filled 2012-03-16 (×2): qty 1

## 2012-03-16 MED ORDER — POTASSIUM CHLORIDE CRYS ER 20 MEQ PO TBCR
40.0000 meq | EXTENDED_RELEASE_TABLET | Freq: Once | ORAL | Status: AC
  Administered 2012-03-16: 40 meq via ORAL
  Filled 2012-03-16: qty 2

## 2012-03-16 NOTE — Progress Notes (Signed)
    SUBJECTIVE:  Breathing she thinks is at baseline this am.  No pain.  They could not find a peripheral IV site.   PHYSICAL EXAM Filed Vitals:   03/15/12 2016 03/15/12 2045 03/16/12 0206 03/16/12 0557  BP: 138/82   129/78  Pulse: 63 61  68  Temp: 98.7 F (37.1 C)   98.2 F (36.8 C)  TempSrc: Oral   Oral  Resp: 20 20 18 18   Height:      Weight:    187 lb 13.3 oz (85.2 kg)  SpO2: 100% 100%  96%   General:  No acute distress Lungs:  Decreased breath sounds with few basilar crackles Heart:  RRR, murmur unchanged Abdomen:  Positive bowel sounds, no rebound no guarding Extremities:  Trace edema.    LABS: Lab Results  Component Value Date   CKTOTAL 220* 11/15/2011   CKMB 13.3* 11/15/2011   TROPONINI <0.30 03/13/2012   Results for orders placed during the hospital encounter of 03/11/12 (from the past 24 hour(s))  PROTIME-INR     Status: Abnormal   Collection Time   03/16/12  5:35 AM      Component Value Range   Prothrombin Time 34.0 (*) 11.6 - 15.2 seconds   INR 3.62 (*) 0.00 - 1.49  BASIC METABOLIC PANEL     Status: Abnormal   Collection Time   03/16/12  5:35 AM      Component Value Range   Sodium 140  135 - 145 mEq/L   Potassium 3.6  3.5 - 5.1 mEq/L   Chloride 106  96 - 112 mEq/L   CO2 26  19 - 32 mEq/L   Glucose, Bld 159 (*) 70 - 99 mg/dL   BUN 35 (*) 6 - 23 mg/dL   Creatinine, Ser 9.56 (*) 0.50 - 1.10 mg/dL   Calcium 21.3 (*) 8.4 - 10.5 mg/dL   GFR calc non Af Amer 26 (*) >90 mL/min   GFR calc Af Amer 30 (*) >90 mL/min    Intake/Output Summary (Last 24 hours) at 03/16/12 0746 Last data filed at 03/15/12 1700  Gross per 24 hour  Intake    240 ml  Output      0 ml  Net    240 ml    ASSESSMENT AND PLAN:  Acute on chronic diastolic CHF/Hypertensive Emergency/Acute Pulmonary Edema/HOCM: Multifactorial respiratory failure.  She seems to have improved with steroids and other pulmonary treatment.  I will increase the IV Lasix.  Chronic Renal Failure - Creat down  slightly.    Atrial fib -  Maintaining NSR.  On warfarin.  I restarted amiodarone.    HTN - BP currently OK.  Continue current medications.  I plan an out patient 24 hour BP monitor as I think that HTN urgency at home might be playing a role in her acute decompensations.    Fayrene Fearing [facility] 03/16/2012 7:46 AM

## 2012-03-16 NOTE — Progress Notes (Addendum)
ANTICOAGULATION CONSULT NOTE - Follow Up Consult  Pharmacy Consult for Coumadin Indication: atrial fibrillation  No Known Allergies  Patient Measurements: Height: 5\' 3"  (160 cm) Weight: 187 lb 13.3 oz (85.2 kg) IBW/kg (Calculated) : 52.4   Vital Signs: Temp: 98.5 F (36.9 C) (11/07 1359) Temp src: Oral (11/07 1359) BP: 131/73 mmHg (11/07 1359) Pulse Rate: 68  (11/07 1359)  Labs:  Basename 03/16/12 0535 03/15/12 0500 03/14/12 0417 03/13/12 2100  HGB -- 9.1* 9.3* --  HCT -- 29.1* 30.3* 29.1*  PLT -- 219 203 188  APTT -- -- -- --  LABPROT 34.0* 38.0* 32.6* --  INR 3.62* 4.21* 3.42* --  HEPARINUNFRC -- -- -- --  CREATININE 1.88* 2.07* 2.13* --  CKTOTAL -- -- -- --  CKMB -- -- -- --  TROPONINI -- -- -- --    Estimated Creatinine Clearance: 28.4 ml/min (by C-G formula based on Cr of 1.88).  Assessment: [name] on coumadin for afib who was previously resumed on home dose (home dose. 2.5 MWF, 5mg  TTSS; verified with [place] notes and [facility]), INR was stable and and therapeutic on 11/3 and 11/4, but increased to 4.21 yesterday. Today INR decreased to 3.62. Coumadin dose held 11/5 and 11/6.  Resumed home po amiodarone 11/5. Maintaining NSR.  Hgb and plt has been stable. No bleeding reported.   Goal of Therapy:  INR 2-3 Monitor platelets by anticoagulation protocol: Yes   Plan:  - No coumadin today - monitor daily INR CBC in AM.   Noah Delaine, RPh Clinical Pharmacist Pager: [phone] 03/16/2012,2:14 PM

## 2012-03-16 NOTE — Progress Notes (Signed)
Name: [name] MRN: [id number] DOB: Feb 06, 1941    LOS: 5  REFERRING PROVIDER:  Zadie Rhine (EDP) CHIEF COMPLAINT:  Respiratory failure   BRIEF PATIENT DESCRIPTION:  71 yo female former smoker admitted 03/11/2012 with weakness and sudden onset of dyspnea from acute pulmonary edema requiring intubation and with acute encephalopathy with SBP of 220.  Developed bradycardia/hypotension after intubation.    Significant PMHx CAD, A fib, Diastolic CHF, HOCM, HTN, CKD stage 4, Anemia   LINES / TUBES: ETT 11/02>>11/04 Rt IJ CVL 11/02>> Rt radial aline 11/02>>11/04  CULTURES: Urine 11/02>>negative Sputum 11/02>>Moderate Strep/ Staph A ss pcn  ANTIBIOTICS: Vancomycin 11/05>>dc Rocephin 11/05>>dc Ancef 11-7>>11/7 Augmentin 11/7>>>  SIGNIFICANT EVENTS:  11/05 Respiratory distress after extubation>>?improvement from diuresis and/or neb tx  LEVEL OF CARE:  Tele PRIMARY SERVICE:  PCCM CONSULTANTS:  [facility] CODE STATUS: Full DIET:  Cardiac diet DVT Px:  Chronic coumadin GI Px:  Protonix  INTERVAL HISTORY:  Walked on room air, sats 85%. Will need home O2    VITAL SIGNS: Temp:  [97.6 F (36.4 C)-98.7 F (37.1 C)] 98.2 F (36.8 C) (11/07 0557) Pulse Rate:  [61-68] 68  (11/07 0557) Resp:  [18-20] 18  (11/07 0557) BP: (109-138)/(58-82) 129/78 mmHg (11/07 0557) SpO2:  [94 %-100 %] 98 % (11/07 0819) FiO2 (%):  [28 %] 28 % (11/06 1007) Weight:  [85.2 kg (187 lb 13.3 oz)] 85.2 kg (187 lb 13.3 oz) (11/07 0557)  PHYSICAL EXAMINATION: General:  No distress Neuro:  Sitting on side of bed Follows commands, moves all extremities HEENT: mm dry, no JVD Cardiovascular:  s1s2 regular, no murmur Lungs:  resps even non labored, desats on room air Abdomen:  Soft, non tender Musculoskeletal:  No edema Skin:  No rashes  DIAGNOSES: Principal Problem:  *Acute respiratory failure with hypoxia   ASSESSMENT / PLAN: PULMONARY  Lab 03/13/12 2101 03/13/12 0247 03/12/12  0433  PHART 7.282* 7.375 7.340*  PCO2ART 47.0* 35.8 41.5  PO2ART 71.0* 92.6 96.0  HCO3 22.1 20.4 21.8  O2SAT 91.0 98.4 96.9    CXR:  11/06 >> slight increased edema  A:  Acute respiratory failure from acute pulmonary edema. No prior hx of obstructive lung disease>>?if wheezing 11/04 after extubation from cardiac asthma vs COPD. Hypoxia P:   Keep in even to negative fluid balance Adjust oxygen to keep SpO2 > 92% F/u CXR 11/06 Change albuterol/atrovent back to scheduled with worsening wheeze  Cont prednisone with taper  May need outpt evaluation for COPD, and OSA Pulm hygiene  Will need home O2 unless diuresis helps Change ancef to PO augmentin  CARDIOVASCULAR  Lab 03/13/12 2100 03/11/12 1931  LATICACIDVEN -- --  O2SATVEN -- --  PROBNP 1314.0* 2010.0*    A: Initially with HTN emergency causing acute pulmonary edema. Hypotension, bradycardia after intubation.- Resolved 11/04. Hx of A fib, CAD, diastolic CHF, HOCM, hyperlipidemia. Appreciate help from cardiology P:  Negative fluid balance Continue atorvastatin, lopressor Cont scheduled PO lasix  Amiodarone restarted by cards Additional dose of lasix today. Cards is addressing HTN and diuresis RENAL  Lab 03/16/12 0535 03/15/12 0500 03/14/12 0417 03/12/12 0400  NA 140 139 141 --  K 3.6 4.0 4.8 --  CL 106 107 110 --  CO2 26 23 23  --  BUN 35* 39* 28* --  CREATININE 1.88* 2.07* 2.13* --  CALCIUM 10.9* 10.9* 11.2* --  MG -- -- 1.9 1.7  PHOS -- -- -- 2.8    Intake/Output Summary (Last 24 hours) at 03/16/12  0940 Last data filed at 03/15/12 1700  Gross per 24 hour  Intake    240 ml  Output      0 ml  Net    240 ml     A:  CKD stage 4. Hypokalemia. P:   Monitor renal fx, urine outpt, electrolytes D/c foley Additional dose of PO lasix. Potassium replacement  GASTROINTESTINAL  Lab 03/13/12 0337  AST 23  ALT 12  ALKPHOS 120*  PROT 6.7  ALBUMIN 2.8*    A: Nutrition. P:   Continue  diet  HEMATOLOGIC  Lab 03/16/12 0535 03/15/12 0500 03/14/12 0417 03/13/12 2100  HGB -- 9.1* 9.3* 9.0*  HCT -- 29.1* 30.3* 29.1*  PLT -- 219 203 188  INR 3.62* 4.21* 3.42* --  APTT -- -- -- --    A:  Chronic coumadin tx for A fib. Anemia. Lab Results  Component Value Date   INR 3.62* 03/16/2012   INR 4.21* 03/15/2012   INR 3.42* 03/14/2012    P:  F/u CBC Coumadin per pharmacy -held 11/4 and 11/5 r/t supra therapeutic   INFECTIOUS  Lab 03/15/12 0500 03/14/12 0417 03/13/12 2100 03/13/12 0337  PROCALCITON -- 14.33 11.88 --  WBC 14.3* 9.7 10.4 12.6*  LATICACIDVEN -- -- -- --    A:  Staph aureus tracheobronchitis. - sensitivities pending  P:   D/C ancef and vanc, start augmentin for total abx days of 8 days.  ENDOCRINE CBG  Lab 03/14/12 1215 03/14/12 0001 03/13/12 2009 03/13/12 1546 03/13/12 1159  GLUCAP 185* 122* 107* 94 121*    A:  Hyperglycemia.   No prior hx of DM.  Likely related to acute stress. P:   Monitor blood sugars on BMET.  NEUROLOGIC  A:  Acute encephalopathy from HTN emergency and acute hypoxic respiratory failure. Resolved 11/05. P:   Monitor mental status.   Summary: Former smoker.  ?if wheezing after extubation from COPD/asthma vs cardiac asthma.  Increased wheeze this am.  May need to consider further diuresis.  Will change nebs back to scheduled for now. D/c central line if PIV access obtained. Unable to obtain PIV will leave cvl in.  Awaiting PT eval. Keep on PCCM service as likely ready for d/c home soon. Cards continue to work on antihtn. No dc 11-7 . Currently  needs home O2 with sat 85% on ra with ambulation 11-7.  Brett Canales Minor ACNP [facility] Pager [phone] till 3 pm If no answer page [phone] 03/16/2012, 9:40 AM  *Care during the described time interval was provided by me and/or other providers on the critical care team. I have reviewed this patient's available data, including medical history, events of note, physical examination  and test results as part of my evaluation.  Once O2 is available, will attempt to D/C home with home O2, otherwise continue current care.  Patient seen and examined, agree with above note.  I dictated the care and orders written for this patient under my direction.  Will give an additional dose of lasix with K-dur, change ancef to augmentin and once all aspects are ready will likely discharge in the next 2-3 days.  Koren Bound, M.D. [phone]

## 2012-03-17 ENCOUNTER — Encounter: Payer: Self-pay | Admitting: Internal Medicine

## 2012-03-17 DIAGNOSIS — I509 Heart failure, unspecified: Secondary | ICD-10-CM

## 2012-03-17 DIAGNOSIS — I161 Hypertensive emergency: Secondary | ICD-10-CM

## 2012-03-17 DIAGNOSIS — I5033 Acute on chronic diastolic (congestive) heart failure: Secondary | ICD-10-CM

## 2012-03-17 DIAGNOSIS — N189 Chronic kidney disease, unspecified: Secondary | ICD-10-CM

## 2012-03-17 LAB — BASIC METABOLIC PANEL
CO2: 27 mEq/L (ref 19–32)
Calcium: 11.8 mg/dL — ABNORMAL HIGH (ref 8.4–10.5)
Glucose, Bld: 152 mg/dL — ABNORMAL HIGH (ref 70–99)
Sodium: 141 mEq/L (ref 135–145)

## 2012-03-17 LAB — CBC
HCT: 33.4 % — ABNORMAL LOW (ref 36.0–46.0)
Hemoglobin: 10.6 g/dL — ABNORMAL LOW (ref 12.0–15.0)
MCH: 26 pg (ref 26.0–34.0)
MCV: 82.1 fL (ref 78.0–100.0)
RBC: 4.07 MIL/uL (ref 3.87–5.11)

## 2012-03-17 LAB — PHOSPHORUS: Phosphorus: 2.3 mg/dL (ref 2.3–4.6)

## 2012-03-17 LAB — PROTIME-INR: Prothrombin Time: 25.2 seconds — ABNORMAL HIGH (ref 11.6–15.2)

## 2012-03-17 MED ORDER — WARFARIN SODIUM 2.5 MG PO TABS
2.5000 mg | ORAL_TABLET | Freq: Once | ORAL | Status: DC
  Filled 2012-03-17: qty 1

## 2012-03-17 MED ORDER — PREDNISONE 10 MG PO TABS: ORAL_TABLET | ORAL | Status: DC

## 2012-03-17 MED ORDER — AMOXICILLIN-POT CLAVULANATE 500-125 MG PO TABS: 1.0000 | ORAL_TABLET | Freq: Three times a day (TID) | ORAL | Status: DC

## 2012-03-17 NOTE — Progress Notes (Signed)
DC CL per order and protocol, end intact, pt tolerated well, occlusive dsg applied. Reminded bed rest for post procedure. Will continue to monitor.

## 2012-03-17 NOTE — Plan of Care (Signed)
Problem: Limited Adherence to Nutrition-Related Recommendations (NB-1.6) Goal: Nutrition education Formal process to instruct or train a patient/client in a skill or to impart knowledge to help patients/clients voluntarily manage or modify food choices and eating behavior to maintain or improve health.  Outcome: Completed/Met Date Met:  03/17/12  RD consulted for nutrition education regarding 2 gm Sodium diet.  RD provided "Low Sodium Nutrition Therapy" handout from the Academy of [employer]etics. Reviewed guidelines and recommendations. Emphasized consuming reduced sodium canned products/foods, fresh or frozen vegetables, salt-free seasonings.  Expect fair compliance.  Body mass index is 32.42 kg/(m^2). Pt meets criteria for Obesity Class I based on current BMI.  Current diet order is Heart Healthy, patient is consuming approximately 100% of meals at this time. Labs and medications reviewed. No further nutrition interventions warranted at this time. If additional nutrition issues arise, please re-consult RD.   Kirkland Hun, RD, LDN Pager #: [phone] After-Hours Pager #: [phone]

## 2012-03-17 NOTE — Discharge Summary (Signed)
Physician Discharge Summary  Patient ID: [name] MRN: [id number] DOB/AGE: August 14, 1940 71 y.o.  Admit date: 03/11/2012 Discharge date: 03/17/2012  Problem List Principal Problem:  *Acute respiratory failure with hypoxia Active Problems:  CARDIOMYOPATHY, IDIOPATHIC HYPERTROPHIC  FIBRILLATION, ATRIAL  Dyslipidemia  CAD  Hypertensive emergency  Acute on chronic diastolic congestive heart failure  CKD (chronic kidney disease)  HPI: [name] is a 71 y/o woman with past medical history of diastolic heart failure and HOCM who has required intubation in the past past for pulmonary edema. Her daughter reports that she has been more fatigued over the last 2 days than normal. This evening she was with her mother and noticed that she was breathing a little more heavily than normal but did not seem to be in any acute distress, however an hour later she was having significant trouble breathing and EMS was called. When she arrived in the emergency room per nursing report she was non-responsive and requiring bag mask ventilation, her blood pressure at that time was 220 systolic. She was intubated and started on a nitro drip as well as propofol for sedation. She had improvement in her saturations but then became hypotensive and bradycardic, her nitro drip a propofol were held with improvement in her blood pressure. She was admitted to the ICU service. According to her daughter she has not had any fever, cough, nausea, vomiting, diarrhea, congestion, rhinorrhea or lightheadedness or syncope.  [name] does have a recent history of a similar event for which she was intubated. This was felt to be due to increased salt intake.   Hospital Course:   LINES / TUBES:  ETT 11/02>>11/04  Rt IJ CVL 11/02>>  Rt radial aline 11/02>>11/04  CULTURES:  Urine 11/02>>negative  Sputum 11/02>>Moderate Strep/ Staph A ss pcn  ANTIBIOTICS:  Vancomycin 11/05>>dc  Rocephin 11/05>>dc  Ancef 11-7>>11/7  Augmentin  11/7>>>  SIGNIFICANT EVENTS:  11/05 Respiratory distress after extubation>>?improvement from diuresis and/or neb tx  LEVEL OF CARE: Tele  PRIMARY SERVICE: PCCM  CONSULTANTS: [name]  CODE STATUS: Full  DIET: Cardiac diet  DVT Px: Chronic coumadin  GI Px: Protonix  INTERVAL HISTORY:  Walked on room air, sats 85%. Will need home O2  ASSESSMENT / PLAN:  PULMONARY   Lab  03/13/12 2101  03/13/12 0247  03/12/12 0433   PHART  7.282*  7.375  7.340*   PCO2ART  47.0*  35.8  41.5   PO2ART  71.0*  92.6  96.0   HCO3  22.1  20.4  21.8   O2SAT  91.0  98.4  96.9    CXR: 11/06 >> slight increased edema  A: Acute respiratory failure from acute pulmonary edema.  No prior hx of obstructive lung disease>>?if wheezing 11/04 after extubation from cardiac asthma vs COPD.  Hypoxia  P:  Keep in even to negative fluid balance  Adjust oxygen to keep SpO2 > 92%  Change albuterol/atrovent back to scheduled with worsening wheeze  Cont prednisone with taper  May need outpt evaluation for COPD, and OSA  Pulm hygiene  Will need home O2 unless diuresis helps  Change ancef to PO Augmentin x total 8 days abx  CARDIOVASCULAR   Lab  03/13/12 2100  03/11/12 1931   LATICACIDVEN  --  --   O2SATVEN  --  --   PROBNP  1314.0*  2010.0*    A: Initially with HTN emergency causing acute pulmonary edema.  Hypotension, bradycardia after intubation.- Resolved 11/04.  Hx of A fib, CAD,  diastolic CHF, HOCM, hyperlipidemia.  Appreciate help from Wilkerson  P:  Negative fluid balance  Continue atorvastatin, lopressor  Cont scheduled PO lasix  Amiodarone restarted by cards  Additional dose of lasix  Cards is addressing HTN and diuresis  RENAL   Lab  03/17/12 0640  03/16/12 0535  03/15/12 0500  03/14/12 0417  03/12/12 0400   NA  141  140  139  --  --   K  3.6  3.6  4.0  --  --   CL  103  106  107  --  --   CO2  27  26  23   --  --   BUN  26*  35*  39*  --  --   CREATININE  1.73*  1.88*  2.07*  --  --     CALCIUM  11.8*  10.9*  10.9*  --  --   MG  2.0  --  --  1.9  1.7   PHOS  2.3  --  --  --  2.8    No intake or output data in the 24 hours ending 03/17/12 1115  A: CKD stage 4.  Hypokalemia.  P:  Monitor renal fx, urine outpt, electrolytes  D/c foley  Additional dose of PO lasix.  Potassium replacement  GASTROINTESTINAL   Lab  03/13/12 0337   AST  23   ALT  12   ALKPHOS  120*   PROT  6.7   ALBUMIN  2.8*    A: Nutrition.  P:  Continue diet  HEMATOLOGIC   Lab  03/17/12 0640  03/16/12 0535  03/15/12 0500  03/14/12 0417   HGB  10.6*  --  9.1*  9.3*   HCT  33.4*  --  29.1*  30.3*   PLT  262  --  219  203   INR  2.42*  3.62*  4.21*  --   APTT  --  --  --  --    A: Chronic coumadin tx for A fib.  Anemia.  Lab Results   Component  Value  Date    INR  2.42*  03/17/2012    INR  3.62*  03/16/2012    INR  4.21*  03/15/2012    P:  F/u CBC  Coumadin per pharmacy -held 11/4 and 11/5 r/t supra therapeutic  INFECTIOUS   Lab  03/17/12 0640  03/15/12 0500  03/14/12 0417  03/13/12 2100   PROCALCITON  --  --  14.33  11.88   WBC  10.1  14.3*  9.7  10.4   LATICACIDVEN  --  --  --  --    A: Staph aureus tracheobronchitis. - sensitivities pending  P:  D/C ancef and vanc, start augmentin for total abx days of 8 days.  ENDOCRINE  CBG   Lab  03/14/12 1215  03/14/12 0001  03/13/12 2009  03/13/12 1546  03/13/12 1159   GLUCAP  185*  122*  107*  94  121*    A: Hyperglycemia.  No prior hx of DM. Likely related to acute stress.  P:  Monitor blood sugars on BMET.  NEUROLOGIC  A: Acute encephalopathy from HTN emergency and acute hypoxic respiratory failure.  Resolved 11/05.  P:  Monitor mental status.  Summary:  Former smoker. ?if wheezing after extubation from COPD/asthma vs cardiac asthma. Increased wheeze this am. May need to consider further diuresis. Will change nebs back to scheduled for now. D/c central line if PIV access obtained. Unable  to obtain PIV will leave cvl in.   Awaiting PT eval. Keep on PCCM service as likely ready for d/c home soon.  Cards continue to work on antihtn. No dc 11-7 . Currently needs home O2 with sat 85% on ra with ambulation 11-7.  11-8 Wilkerson will follow with in 7 days  11-8 will discharge home on home O2 at 2 l [place]. Follow with new PCP Dr. Melvyn Novas 11-13 @ 930 am, Dr. Elly Modena of pulmonary will see her 11-27 @245  pm for O2 dependent COPD      Labs at discharge Lab Results  Component Value Date   CREATININE 1.73* 03/17/2012   BUN 26* 03/17/2012   NA 141 03/17/2012   K 3.6 03/17/2012   CL 103 03/17/2012   CO2 27 03/17/2012   Lab Results  Component Value Date   WBC 10.1 03/17/2012   HGB 10.6* 03/17/2012   HCT 33.4* 03/17/2012   MCV 82.1 03/17/2012   PLT 262 03/17/2012   Lab Results  Component Value Date   ALT 12 03/13/2012   AST 23 03/13/2012   ALKPHOS 120* 03/13/2012   BILITOT 0.6 03/13/2012   Lab Results  Component Value Date   INR 2.42* 03/17/2012   INR 3.62* 03/16/2012   INR 4.21* 03/15/2012    Current radiology studies No results found.  Disposition:  01-Home or Self Care  Discharge Orders    Future Appointments: Provider: Department: Dept Phone: Center:   03/22/2012 9:30 AM Corwin Levins, MD [facility] [phone] [facility]   03/22/2012 12:10 PM Beatrice Lecher, PA [facility] [place]) [phone] [email or web address]   04/05/2012 2:45 PM Kalman Shan, MD [facility] [phone] None     Future Orders Please Complete By Expires   Discharge patient          Medication List     As of 03/17/2012 12:30 PM    TAKE these medications         amiodarone 200 MG tablet   Commonly known as: PACERONE   Take 200 mg by mouth daily. Take 200mg  daily starting 12/16/11      amoxicillin-clavulanate 500-125 MG per tablet   Commonly known as: AUGMENTIN   Take 1 tablet (500 mg total) by mouth every 8 (eight) hours.      furosemide 40 MG tablet   Commonly known as: LASIX    Take 40 mg by mouth daily.      metoprolol tartrate 25 MG tablet   Commonly known as: LOPRESSOR   Take 25 mg by mouth 2 (two) times daily.      nitroGLYCERIN 0.4 MG SL tablet   Commonly known as: NITROSTAT   Place 0.4 mg under the tongue every 5 (five) minutes as needed. For chest pain      pantoprazole 40 MG tablet   Commonly known as: PROTONIX   Take 40 mg by mouth daily.      predniSONE 10 MG tablet   Commonly known as: DELTASONE   2 tabs daily x 4 days, then 1 tab daily x4 days then stop.      rosuvastatin 10 MG tablet   Commonly known as: CRESTOR   Take 10 mg by mouth at bedtime.      warfarin 5 MG tablet   Commonly known as: COUMADIN   Take 2.5-5 mg by mouth daily. 2.5 mg Monday, Wednesday, Friday; 5mg  Tuesday, Thursday, Saturday, Sunday         Discharged Condition: Good  Vital signs at Discharge. Temp:  [98.4 F (36.9 C)-98.6 F (37 C)] 98.4 F (36.9 C) (11/08 0545) Pulse Rate:  [53-68] 58  (11/08 1122) Resp:  [18-19] 19  (11/08 0545) BP: (120-145)/(73-87) 120/79 mmHg (11/08 1122) SpO2:  [94 %-100 %] 96 % (11/08 0758) Weight:  [83.008 kg (183 lb)] 83.008 kg (183 lb) (11/08 0723) Office follow up Special Information or instructions. See Dr Vaughan Browner for O2 dependent COPD. Follow up with Dr. Melvyn Novas new PCP. Follow up with Wilkerson. Signed: Brett Canales Minor ACNP [facility] PCCM Pager [phone] till 3 pm If no answer page [phone] 03/17/2012, 12:30 PM  Will D/C home, F/U as arranged.  Patient seen and examined, agree with above note.  I personally supervised and addressed issues as above.  Alyson Reedy, M.D. Pulmonary/Critical Care Medicine [phone]

## 2012-03-17 NOTE — Progress Notes (Cosign Needed)
Name: [name] MRN: [id number] DOB: 1941/05/01    LOS: 6  REFERRING PROVIDER:  Zadie Rhine (EDP) CHIEF COMPLAINT:  Respiratory failure   BRIEF PATIENT DESCRIPTION:  71 yo female former smoker admitted 03/11/2012 with weakness and sudden onset of dyspnea from acute pulmonary edema requiring intubation and with acute encephalopathy with SBP of 220.  Developed bradycardia/hypotension after intubation.    Significant PMHx CAD, A fib, Diastolic CHF, HOCM, HTN, CKD stage 4, Anemia   LINES / TUBES: ETT 11/02>>11/04 Rt IJ CVL 11/02>> Rt radial aline 11/02>>11/04  CULTURES: Urine 11/02>>negative Sputum 11/02>>Moderate Strep/ Staph A ss pcn  ANTIBIOTICS: Vancomycin 11/05>>dc Rocephin 11/05>>dc Ancef 11-7>>11/7 Augmentin 11/7>>>  SIGNIFICANT EVENTS:  11/05 Respiratory distress after extubation>>?improvement from diuresis and/or neb tx  LEVEL OF CARE:  Tele PRIMARY SERVICE:  PCCM CONSULTANTS:  [facility] CODE STATUS: Full DIET:  Cardiac diet DVT Px:  Chronic coumadin GI Px:  Protonix  INTERVAL HISTORY:  Walked on room air, sats 85%. Will need home O2    VITAL SIGNS: Temp:  [98.4 F (36.9 C)-98.6 F (37 C)] 98.4 F (36.9 C) (11/08 0545) Pulse Rate:  [53-68] 53  (11/08 0545) Resp:  [18-19] 19  (11/08 0545) BP: (129-145)/(73-87) 145/87 mmHg (11/08 0545) SpO2:  [94 %-100 %] 96 % (11/08 0758) Weight:  [83.008 kg (183 lb)] 83.008 kg (183 lb) (11/08 0723)  PHYSICAL EXAMINATION: General:  No distress Neuro:  Sitting on side of bed Follows commands, moves all extremities HEENT: mm dry, no JVD Cardiovascular:  s1s2 regular, no murmur Lungs:  resps even non labored, desats on room air Abdomen:  Soft, non tender Musculoskeletal:  No edema Skin:  No rashes  DIAGNOSES: Principal Problem:  *Acute respiratory failure with hypoxia Active Problems:  CARDIOMYOPATHY, IDIOPATHIC HYPERTROPHIC  FIBRILLATION, ATRIAL  Dyslipidemia  CAD  Hypertensive emergency  Acute on chronic diastolic congestive heart failure  CKD (chronic kidney disease)   ASSESSMENT / PLAN: PULMONARY  Lab 03/13/12 2101 03/13/12 0247 03/12/12 0433  PHART 7.282* 7.375 7.340*  PCO2ART 47.0* 35.8 41.5  PO2ART 71.0* 92.6 96.0  HCO3 22.1 20.4 21.8  O2SAT 91.0 98.4 96.9    CXR:  11/06 >> slight increased edema  A:  Acute respiratory failure from acute pulmonary edema. No prior hx of obstructive lung disease>>?if wheezing 11/04 after extubation from cardiac asthma vs COPD. Hypoxia P:   Keep in even to negative fluid balance Adjust oxygen to keep SpO2 > 92% Change albuterol/atrovent back to scheduled with worsening wheeze  Cont prednisone with taper  May need outpt evaluation for COPD, and OSA Pulm hygiene  Will need home O2 unless diuresis helps Change ancef to PO Augmentin x total 8 days abx  CARDIOVASCULAR  Lab 03/13/12 2100 03/11/12 1931  LATICACIDVEN -- --  O2SATVEN -- --  PROBNP 1314.0* 2010.0*    A: Initially with HTN emergency causing acute pulmonary edema. Hypotension, bradycardia after intubation.- Resolved 11/04. Hx of A fib, CAD, diastolic CHF, HOCM, hyperlipidemia. Appreciate help from cardiology P:  Negative fluid balance Continue atorvastatin, lopressor Cont scheduled PO lasix  Amiodarone restarted by cards Additional dose of lasix  Cards is addressing HTN and diuresis RENAL  Lab 03/17/12 0640 03/16/12 0535 03/15/12 0500 03/14/12 0417 03/12/12 0400  NA 141 140 139 -- --  K 3.6 3.6 4.0 -- --  CL 103 106 107 -- --  CO2 27 26 23  -- --  BUN 26* 35* 39* -- --  CREATININE 1.73* 1.88* 2.07* -- --  CALCIUM 11.8*  10.9* 10.9* -- --  MG 2.0 -- -- 1.9 1.7  PHOS 2.3 -- -- -- 2.8   No intake or output data in the 24 hours ending 03/17/12 1115   A:  CKD stage 4. Hypokalemia. P:   Monitor renal fx, urine outpt, electrolytes D/c foley Additional dose of PO lasix. Potassium replacement  GASTROINTESTINAL  Lab 03/13/12 0337  AST 23  ALT 12   ALKPHOS 120*  PROT 6.7  ALBUMIN 2.8*    A: Nutrition. P:   Continue diet  HEMATOLOGIC  Lab 03/17/12 0640 03/16/12 0535 03/15/12 0500 03/14/12 0417  HGB 10.6* -- 9.1* 9.3*  HCT 33.4* -- 29.1* 30.3*  PLT 262 -- 219 203  INR 2.42* 3.62* 4.21* --  APTT -- -- -- --    A:  Chronic coumadin tx for A fib. Anemia. Lab Results  Component Value Date   INR 2.42* 03/17/2012   INR 3.62* 03/16/2012   INR 4.21* 03/15/2012    P:  F/u CBC Coumadin per pharmacy -held 11/4 and 11/5 r/t supra therapeutic   INFECTIOUS  Lab 03/17/12 0640 03/15/12 0500 03/14/12 0417 03/13/12 2100  PROCALCITON -- -- 14.33 11.88  WBC 10.1 14.3* 9.7 10.4  LATICACIDVEN -- -- -- --    A:  Staph aureus tracheobronchitis. - sensitivities pending  P:   D/C ancef and vanc, start augmentin for total abx days of 8 days.  ENDOCRINE CBG  Lab 03/14/12 1215 03/14/12 0001 03/13/12 2009 03/13/12 1546 03/13/12 1159  GLUCAP 185* 122* 107* 94 121*    A:  Hyperglycemia.   No prior hx of DM.  Likely related to acute stress. P:   Monitor blood sugars on BMET.  NEUROLOGIC  A:  Acute encephalopathy from HTN emergency and acute hypoxic respiratory failure. Resolved 11/05. P:   Monitor mental status.   Summary: Former smoker.  ?if wheezing after extubation from COPD/asthma vs cardiac asthma.  Increased wheeze this am.  May need to consider further diuresis.  Will change nebs back to scheduled for now. D/c central line if PIV access obtained. Unable to obtain PIV will leave cvl in.  Awaiting PT eval. Keep on PCCM service as likely ready for d/c home soon. Cards continue to work on antihtn. No dc 11-7 . Currently  needs home O2 with sat 85% on ra with ambulation 11-7. 11-8 Cardiology will follow with in 7 days 11-8 will discharge home on home O2 at 2 l [place].  Follow with new PCP Dr. Melvyn Novas 11-13 @ 930 am, Dr. Elly Modena of pulmonary will see her 11-27 @245  pm for O2 dependent COPD   Brett Canales Minor ACNP [facility]  PCCM Pager [phone] till 3 pm If no answer page [phone] 03/17/2012, 11:15 AM

## 2012-03-17 NOTE — Progress Notes (Signed)
Patient Name: [name] Date of Encounter: 03/17/2012     Principal Problem:  *Acute respiratory failure with hypoxia Active Problems:  CARDIOMYOPATHY, IDIOPATHIC HYPERTROPHIC  FIBRILLATION, ATRIAL  Dyslipidemia  CAD  Hypertensive emergency  Acute on chronic diastolic congestive heart failure  CKD (chronic kidney disease)    SUBJECTIVE: Feels well this AM. Breathing a bit better. No chest pain, palpitations, lightheadedness.    OBJECTIVE  Filed Vitals:   03/16/12 1429 03/16/12 2041 03/16/12 2112 03/17/12 0545  BP:  132/81 129/80 145/87  Pulse:  63 63 53  Temp:  98.6 F (37 C)  98.4 F (36.9 C)  TempSrc:  Oral  Oral  Resp:  18  19  Height:      Weight:      SpO2: 94% 100%  100%    Intake/Output Summary (Last 24 hours) at 03/17/12 0641 Last data filed at 03/16/12 0800  Gross per 24 hour  Intake      0 ml  Output    100 ml  Net   -100 ml   Weight change:   PHYSICAL EXAM  General:  Well developed, well nourished, in no acute distress. Head: Normocephalic, atraumatic, sclera non-icteric, no xanthomas, nares are without discharge.  Neck: Supple without bruits or JVD. Lungs: Distant breath sounds, resp regular and unlabored, CTAB w/o wheezes, rales or rhonchi Heart: Bradycardic, regular, II/VI systolic murmur at RUSB, no s3, s4, or murmurs. Abdomen: Soft, non-tender, non-distended, BS + x 4.  Msk:  Strength and tone appears normal for age. Extremities: No clubbing, cyanosis or edema. DP/PT/Radials 2+ and equal bilaterally. Neuro:  Alert and oriented X 3. Moves all extremities spontaneously. Psych: Normal affect.  LABS:  Recent Labs  Basename 03/15/12 0500   WBC 14.3*   HGB 9.1*   HCT 29.1*   MCV 82.2   PLT 219   Lab 03/16/12 0535 03/15/12 0500 03/14/12 0417 03/13/12 0337  NA 140 139 141 --  K 3.6 4.0 4.8 --  CL 106 107 110 --  CO2 26 23 23  --  BUN 35* 39* 28* --  CREATININE 1.88* 2.07* 2.13* --  CALCIUM 10.9* 10.9* 11.2* --  PROT -- -- --  6.7  BILITOT -- -- -- 0.6  ALKPHOS -- -- -- 120*  ALT -- -- -- 12  AST -- -- -- 23  AMYLASE -- -- -- --  LIPASE -- -- -- --  GLUCOSE 159* 167* 166* --    TELE: sinus bradycardia, HR 40s overnight  Radiology/Studies:  Dg Chest 2 View  03/15/2012  *RADIOLOGY REPORT*  Clinical Data: Cough, shortness of breath, follow up of congestive heart failure  CHEST - 2 VIEW  Comparison: Portable chest x-ray of 03/14/2012  Findings: The lungs remain hyperaerated consistent with COPD. Cardiomegaly and prominent interstitial markings remain most consistent with mild interstitial edema.  The right IJ central venous line tip overlies the region of the right atrium.  The bones are osteopenic.  IMPRESSION:  1.  Persistent cardiomegaly with moderate pulmonary vascular congestion. 2.  Right IJ central venous line tip overlies the right atrium. 3.  COPD.   Original Report Authenticated By: Dwyane Dee, M.D.    Dg Chest Port 1 View  03/14/2012  *RADIOLOGY REPORT*  Clinical Data: CHF  PORTABLE CHEST - 1 VIEW  Comparison: Yesterday  Findings: Endotracheal and NG tubes removed.  Stable central venous catheter.  Cardiomegaly persists.  Bibasilar haziness likely a combination of pleural fluid and volume loss has improved.  No pneumothorax.  IMPRESSION: Extubated.  Improved bibasilar haziness as described.   Original Report Authenticated By: Jolaine Click, M.D.    Dg Chest Port 1 View  03/13/2012  *RADIOLOGY REPORT*  Clinical Data: 71 year old female respiratory distress.  Shortness of breath and cough.  PORTABLE CHEST - 1 VIEW  Comparison: 03/11/2012 and earlier.  Findings: Portable AP semi upright view 2028 hours.  Right IJ central line tip continues to project at the right atrium.  The patient has been extubated with enteric tube removed.  Lower lung volumes.  Pulmonary vascularity remains increased but appears improved from comparison.  Crowding of markings at both bases.  No pneumothorax or large effusion.  No definite  consolidation. Stable cardiomegaly and mediastinal contours.  IMPRESSION: 1.  Extubated and enteric tube removed. 2.  Lower lung volumes with mildly decreased bibasilar opacity. 3.  Pulmonary edema appears decreased. 4.  Stable right IJ central line allowing for lower lung volumes, tip at the right atrium level.   Original Report Authenticated By: Erskine Speed, M.D.    Dg Chest Port 1 View  03/13/2012  *RADIOLOGY REPORT*  Clinical Data: Check endotracheal tube  PORTABLE CHEST - 1 VIEW  Comparison: 03/11/2012  Findings: Cardiomegaly again noted.  Central vascular congestion. Stable NG tube position.  Stable right IJ central line position with tip in the right atrium.  Endotracheal tube in place with tip 4.7 cm above the carina.  Probable small bilateral pleural effusion with bilateral basilar atelectasis or infiltrate.  IMPRESSION: Stable support apparatus.  Central vascular congestion.  Probable small bilateral pleural effusion with bilateral basilar atelectasis or infiltrate.   Original Report Authenticated By: Natasha Mead, M.D.    Dg Chest Portable 1 View  03/11/2012  *RADIOLOGY REPORT*  Clinical Data: Respiratory distress.  PORTABLE CHEST - 1 VIEW  Comparison: 03/19/2012 and 1924 hours.  Findings: Endotracheal tube with tip about 5 cm above the carina. Enteric tube tip is in the left upper quadrant, likely in the mid stomach.  Interval placement of a right internal jugular central venous catheter with tip over the upper right atrium.  Cardiac enlargement with pulmonary vascular congestion and diffuse interstitial edema.  No blunting of costophrenic angles.  No pneumothorax.  IMPRESSION: Appliances positioned as described.  Persistent cardiac enlargement with pulmonary vascular congestion and diffuse edema.   Original Report Authenticated By: Burman Nieves, M.D.    Dg Chest Portable 1 View  03/11/2012  *RADIOLOGY REPORT*  Clinical Data: Respiratory distress.  PORTABLE CHEST - 1 VIEW  Comparison: 01/31/2012   Findings: Endotracheal tube is 3 cm above the carina.  NG tube enters the stomach.  Cardiomegaly.  Diffuse bilateral airspace disease, increased since prior study compatible with worsening edema.  No effusions.  No acute bony abnormality.  IMPRESSION: Moderate CHF pattern.   Original Report Authenticated By: Charlett Nose, M.D.     Current Medications:     . albuterol  2.5 mg Nebulization Q6H   And  . ipratropium  0.5 mg Nebulization Q6H  . amiodarone  200 mg Oral Daily  . amoxicillin-clavulanate  1 tablet Oral Q8H  . atorvastatin  20 mg Oral q1800  . furosemide  80 mg Oral Daily  . metoprolol tartrate  12.5 mg Oral BID  . pantoprazole  40 mg Oral Q1200  . [COMPLETED] potassium chloride  40 mEq Oral Once  . predniSONE  20 mg Oral Q breakfast  . Warfarin - Pharmacist Dosing Inpatient   Does not apply q1800  . [DISCONTINUED]  ceFAZolin (ANCEF) IV  2 g Intravenous Q12H  . [DISCONTINUED] furosemide  40 mg Oral Daily  . [DISCONTINUED] warfarin  1 mg Oral ONCE-1800  . [DISCONTINUED] Warfarin - Pharmacist Dosing Inpatient   Does not apply q1800    ASSESSMENT AND PLAN:  1. Acute on likely chronic respiratory failure- multifactorial 2/2 hypertensive emergency -> flash pulmonary edema; HOCM w/ associated A/C diastolic CHF and likely underlying COPD. Pt states over past 2 months, she has noticed increased DOE walking from bedroom to bathroom. BPs better controlled. Continues to diurese modestly. Breathing a bit better today. Management going forward will need to be aimed at addressing BP control, HOCM/chronic diastolic CHF treatment and work-up for COPD/OSA as per pulm/CCM recs. Currently being treated for A/COPD.  2. HTN- plan is for outpatient BP monitor to assess for underlying hypertensive episodes driving CHF decompensations and potentiating DOE.   3. Acute on CKD- Cr continues to improve.   4. Paroxysmal atrial fibrillation- maintaining NSR on amiodarone. Will need to monitor pulmonary  status going forward. Check PFTs, LFTs and TFTs as an OP. Asymptomatic episodes of sinus bradycardia w/ HR 40s overnight. Will discuss with MD benefit of negative chronotropic effects of BB against risk w/ underlying bradycardia and COPD. Continue Coumadin.   5. HOCM- evident of 07/13 echo. +/- BB continuation.   6. CAD- mild-moderate, nonobs CAD in 2011. Add low-dose ASA.   7. Dyslipidemia- continue statin.    Signed, R. Hurman Horn, PA-C 03/17/2012, 6:41 AM ' History and all data above reviewed.  Patient examined.  I agree with the findings as above.  She is ready to leave.  She thinks that her breathing is at baseline.  The patient exam reveals COR:RRR murmur unchanged  ,  Lungs: Clear with decreased breath sounds ,  Abd: Positive bowel sounds, no rebound no guarding, Ext No edema  .  All available labs, radiology testing, previous records reviewed. Agree with documented assessment and plan. She is OK to go home on current meds.  PCCM arranging home oxygen.  We will arrange follow up with [place].   Fayrene Fearing Lazlo Tunney  11:50 AM  03/17/2012

## 2012-03-17 NOTE — Progress Notes (Signed)
ANTICOAGULATION CONSULT NOTE - Follow Up Consult  Pharmacy Consult for Coumadin Indication: atrial fibrillation  No Known Allergies  Patient Measurements: Height: 5\' 3"  (160 cm) Weight: 183 lb (83.008 kg) IBW/kg (Calculated) : 52.4   Vital Signs: Temp: 98.4 F (36.9 C) (11/08 0545) Temp src: Oral (11/08 0545) BP: 120/79 mmHg (11/08 1122) Pulse Rate: 58  (11/08 1122)  Labs:  Basename 03/17/12 0640 03/16/12 0535 03/15/12 0500  HGB 10.6* -- 9.1*  HCT 33.4* -- 29.1*  PLT 262 -- 219  APTT -- -- --  LABPROT 25.2* 34.0* 38.0*  INR 2.42* 3.62* 4.21*  HEPARINUNFRC -- -- --  CREATININE 1.73* 1.88* 2.07*  CKTOTAL -- -- --  CKMB -- -- --  TROPONINI -- -- --    Estimated Creatinine Clearance: 30.4 ml/min (by C-G formula based on Cr of 1.73).  Assessment: [name] on coumadin for afib who was previously resumed on home dose (home dose. 2.5 MWF, 5mg  TTSS; verified with [facility] [place] notes and [facility]). She has received coumadin 5-5-2.5 and it has been held the past 3 days. Today INR decreased to 2.42.  Resumed home po amiodarone 11/5. Maintaining NSR.  Hgb and plt has been stable. No bleeding reported.   Goal of Therapy:  INR 2-3 Monitor platelets by anticoagulation protocol: Yes   Plan:  1. Coumadin 2.5 mg today, anticipate INR may drop more 2nd coumadin held x 3 days 2. If INR OK in am, consider resuming home dose of 2.5mg  MWF and 5 mg TTSS. 3. Daily INR for now [name], Pharm.D. [phone] 03/17/2012 11:29 AM

## 2012-03-22 ENCOUNTER — Encounter: Payer: Medicare Other | Admitting: Physician Assistant

## 2012-03-22 ENCOUNTER — Ambulatory Visit: Payer: Medicare Other | Admitting: Internal Medicine

## 2012-03-23 ENCOUNTER — Encounter: Payer: Self-pay | Admitting: Physician Assistant

## 2012-03-23 ENCOUNTER — Ambulatory Visit (INDEPENDENT_AMBULATORY_CARE_PROVIDER_SITE_OTHER): Payer: Medicare Other | Admitting: Physician Assistant

## 2012-03-23 ENCOUNTER — Ambulatory Visit (INDEPENDENT_AMBULATORY_CARE_PROVIDER_SITE_OTHER): Payer: Medicare Other

## 2012-03-23 VITALS — BP 152/90 | HR 54 | Ht 63.0 in | Wt 187.0 lb

## 2012-03-23 DIAGNOSIS — I5032 Chronic diastolic (congestive) heart failure: Secondary | ICD-10-CM

## 2012-03-23 DIAGNOSIS — I2510 Atherosclerotic heart disease of native coronary artery without angina pectoris: Secondary | ICD-10-CM

## 2012-03-23 DIAGNOSIS — I4891 Unspecified atrial fibrillation: Secondary | ICD-10-CM

## 2012-03-23 DIAGNOSIS — I10 Essential (primary) hypertension: Secondary | ICD-10-CM

## 2012-03-23 DIAGNOSIS — J810 Acute pulmonary edema: Secondary | ICD-10-CM

## 2012-03-23 MED ORDER — AMLODIPINE BESYLATE 5 MG PO TABS: 5.0000 mg | ORAL_TABLET | Freq: Every day | ORAL | Status: DC

## 2012-03-23 NOTE — Patient Instructions (Addendum)
START NORVASC 5 MG DAILY  LEXISCAN DX 414.01, 428.32, ACUTE PULMONARY EDEMA  YOU HAVE A FOLLOW UP APPOINTMENT ON 04/27/12 @ 12 PM WITH DR. HOCHREIN

## 2012-03-23 NOTE — Progress Notes (Signed)
[address] [place], [place]  [address] Phone: [phone], Fax:  [phone]  Date:  03/23/2012   Name:  [name]   DOB:  1940/11/04   MRN:  [id number]  PCP:  Oliver Barre, MD  Primary Cardiologist:  Dr. Rollene Rotunda  Primary Electrophysiologist:  None    History of Present Illness: [name] is a 71 y.o. female who returns for follow up after recent admission to the hospital for acute pulmonary edema in the setting of hypertensive emergency complicated by ventilator dependent respiratory failure.  She has a hx of HOCM, diastolic CHF, CAD , atrial fibrillation with prior NSTEMI in the setting of AFib with RVR 11/11, HTN, chronic kidney disease, iron deficiency anemia. She was admitted 11/2-11/8. The patient presented to the emergency room with sudden development of acute pulmonary edema in the setting of marked hypertension. This episode developed shortly after eating a high salt meal. She required intubation. She improved with IV diuresis and nitroglycerin. She was subsequently extubated. Sputum culture grew out staph aureus. She was treated with antibiotics. Of note, patient is on amiodarone. She maintained normal sinus rhythm. According to the records, it appears that she was to be set up for an outpatient blood pressure monitor and possibly evaluation for ischemia. She also has followup with pulmonary. Since discharge, she is doing well. She denies significant changes in her dyspnea. She sleeps on 2 pillows chronically. This is unchanged. She denies PND. She denies LE edema. She denies chest pain. She denies syncope. She is trying to maintain a low-salt diet.  Labs (10/13):    K 4.4, creatinine 1.3, Labs (11/13):    K 3.6, creatinine 1.73, ALT 12, Hgb 10.6  Wt Readings from Last 3 Encounters:  03/23/12 187 lb (84.823 kg)  03/17/12 183 lb (83.008 kg)  03/02/12 190 lb 6.4 oz (86.365 kg)     Past Medical History  Diagnosis Date  . CAD (coronary  artery disease)     cath 11/11: LAD 40%, mid-dist 25-30%; prox CFX 30%, mid 40%; OM 50%; PDA 50%; PLV 50%;  EF 65%  . Acute myocardial infarction     in setting of AFib with RVR 03/2010  . Atrial fibrillation     DCCV 11/17/11; coumadin & amiodarone  . HOCM (hypertrophic obstructive cardiomyopathy)     Echo 11/2011:severe LVH, EF 65-70%, mid-cavity gradient up to  . Hypertension   . Tobacco abuse   . Chronic kidney disease     Stage 4  . CHF (congestive heart failure)     preserved LVF  . Iron deficiency anemia   . Third degree uterine prolapse   . Hx MRSA infection     Current Outpatient Prescriptions  Medication Sig Dispense Refill  . amiodarone (PACERONE) 200 MG tablet Take 200 mg by mouth daily. Take 200mg  daily starting 12/16/11      . amoxicillin-clavulanate (AUGMENTIN) 500-125 MG per tablet Take 1 tablet (500 mg total) by mouth every 8 (eight) hours.  18 tablet  0  . furosemide (LASIX) 40 MG tablet Take 40 mg by mouth daily.      . metoprolol tartrate (LOPRESSOR) 25 MG tablet Take 25 mg by mouth 2 (two) times daily.      . nitroGLYCERIN (NITROSTAT) 0.4 MG SL tablet Place 0.4 mg under the tongue every 5 (five) minutes as needed. For chest pain      . pantoprazole (PROTONIX) 40 MG tablet Take 40 mg by mouth daily.      Marland Kitchen  predniSONE (DELTASONE) 10 MG tablet 2 tabs daily x 4 days, then 1 tab daily x4 days then stop.  12 tablet  0  . rosuvastatin (CRESTOR) 10 MG tablet Take 10 mg by mouth at bedtime.       Marland Kitchen warfarin (COUMADIN) 5 MG tablet Take 2.5-5 mg by mouth daily. 2.5 mg Monday, Wednesday, Friday; 5mg  Tuesday, Thursday, Saturday, Sunday        Allergies:   No Known Allergies  Social History:  The patient  reports that she quit smoking about 7 weeks ago. Her smoking use included Cigarettes. She has a 7.5 pack-year smoking history. She has never used smokeless tobacco. She reports that she does not drink alcohol or use illicit drugs.   ROS:  Please see the history of  present illness.   She denies fevers, cough, melena, hematochezia.   All other systems reviewed and negative.   PHYSICAL EXAM: VS:  BP 152/90  Pulse 54  Ht 5\' 3"  (1.6 m)  Wt 187 lb (84.823 kg)  BMI 33.13 kg/m2 Well nourished, well developed, in no acute distress HEENT: normal Neck: no JVD at 90  Cardiac:  normal S1, S2; RRR; 2/6 systolic murmur heard best along LSB  Lungs:  clear to auscultation bilaterally, no wheezing, rhonchi or rales Abd: soft, nontender, no hepatomegaly Ext: no edema Skin: warm and dry Neuro:  CNs 2-12 intact, no focal abnormalities noted  EKG:  Sinus bradycardia, HR 52, LVH with repolarization abnormality, question preexcitation pattern       ASSESSMENT AND PLAN:  1.  Chronic Diastolic CHF:   She has been admitted with recurrent pulmonary edema in the setting of hypertensive emergency. We suspect that she has flash pulmonary edema related to rapid increases in her blood pressure likely related to high salt diet. I reviewed her case today with Dr. Antoine Poche. We will proceed with a Lexiscan Myoview to rule out the possibility of ischemia. If this is low risk, we will then proceed with a 24-hour ambulatory blood pressure monitor. Plan followup with Dr. Antoine Poche in one month.  2. Hypertension:   Uncontrolled. Start Norvasc 5 mg daily.  3. Atrial Fibrillation:   Maintaining sinus rhythm on Amiodarone. She is followed by our Coumadin clinic.  4. Coronary Artery Disease:   Continue statin therapy. Proceed with stress testing as noted.  5. S/P Hypoxic Respiratory Failure:   Followup with pulmonology is pending.  6. Chronic Kidney Disease:  Renal function has remained stable.   Luna Glasgow, PA-C  12:55 PM 03/23/2012

## 2012-04-04 ENCOUNTER — Encounter (HOSPITAL_COMMUNITY): Payer: Medicare Other

## 2012-04-04 ENCOUNTER — Ambulatory Visit: Payer: Medicare Other | Admitting: Internal Medicine

## 2012-04-05 ENCOUNTER — Inpatient Hospital Stay: Payer: Medicare Other | Admitting: Internal Medicine

## 2012-04-06 ENCOUNTER — Emergency Department (HOSPITAL_COMMUNITY): Payer: Medicare Other

## 2012-04-06 ENCOUNTER — Encounter (HOSPITAL_COMMUNITY): Payer: Self-pay | Admitting: *Deleted

## 2012-04-06 ENCOUNTER — Inpatient Hospital Stay (HOSPITAL_COMMUNITY)
Admission: EM | Admit: 2012-04-06 | Discharge: 2012-04-09 | DRG: 292 | Disposition: A | Discharge status: Home / Self Care | Payer: Medicare Other | Attending: Internal Medicine | Admitting: Internal Medicine

## 2012-04-06 DIAGNOSIS — J9690 Respiratory failure, unspecified, unspecified whether with hypoxia or hypercapnia: Secondary | ICD-10-CM

## 2012-04-06 DIAGNOSIS — R5383 Other fatigue: Secondary | ICD-10-CM

## 2012-04-06 DIAGNOSIS — R0602 Shortness of breath: Secondary | ICD-10-CM

## 2012-04-06 DIAGNOSIS — Z87891 Personal history of nicotine dependence: Secondary | ICD-10-CM

## 2012-04-06 DIAGNOSIS — E785 Hyperlipidemia, unspecified: Secondary | ICD-10-CM

## 2012-04-06 DIAGNOSIS — E872 Acidosis, unspecified: Secondary | ICD-10-CM

## 2012-04-06 DIAGNOSIS — I509 Heart failure, unspecified: Secondary | ICD-10-CM

## 2012-04-06 DIAGNOSIS — A0472 Enterocolitis due to Clostridium difficile, not specified as recurrent: Secondary | ICD-10-CM

## 2012-04-06 DIAGNOSIS — Z0000 Encounter for general adult medical examination without abnormal findings: Secondary | ICD-10-CM

## 2012-04-06 DIAGNOSIS — I252 Old myocardial infarction: Secondary | ICD-10-CM

## 2012-04-06 DIAGNOSIS — R5381 Other malaise: Secondary | ICD-10-CM

## 2012-04-06 DIAGNOSIS — I10 Essential (primary) hypertension: Secondary | ICD-10-CM

## 2012-04-06 DIAGNOSIS — N189 Chronic kidney disease, unspecified: Secondary | ICD-10-CM

## 2012-04-06 DIAGNOSIS — Z8249 Family history of ischemic heart disease and other diseases of the circulatory system: Secondary | ICD-10-CM

## 2012-04-06 DIAGNOSIS — I428 Other cardiomyopathies: Secondary | ICD-10-CM

## 2012-04-06 DIAGNOSIS — F17200 Nicotine dependence, unspecified, uncomplicated: Secondary | ICD-10-CM

## 2012-04-06 DIAGNOSIS — N185 Chronic kidney disease, stage 5: Secondary | ICD-10-CM

## 2012-04-06 DIAGNOSIS — N184 Chronic kidney disease, stage 4 (severe): Secondary | ICD-10-CM | POA: Diagnosis present

## 2012-04-06 DIAGNOSIS — I129 Hypertensive chronic kidney disease with stage 1 through stage 4 chronic kidney disease, or unspecified chronic kidney disease: Secondary | ICD-10-CM | POA: Diagnosis present

## 2012-04-06 DIAGNOSIS — T45515A Adverse effect of anticoagulants, initial encounter: Secondary | ICD-10-CM | POA: Diagnosis present

## 2012-04-06 DIAGNOSIS — I161 Hypertensive emergency: Secondary | ICD-10-CM

## 2012-04-06 DIAGNOSIS — R739 Hyperglycemia, unspecified: Secondary | ICD-10-CM

## 2012-04-06 DIAGNOSIS — J810 Acute pulmonary edema: Secondary | ICD-10-CM

## 2012-04-06 DIAGNOSIS — Y92009 Unspecified place in unspecified non-institutional (private) residence as the place of occurrence of the external cause: Secondary | ICD-10-CM

## 2012-04-06 DIAGNOSIS — E876 Hypokalemia: Secondary | ICD-10-CM

## 2012-04-06 DIAGNOSIS — Z79899 Other long term (current) drug therapy: Secondary | ICD-10-CM

## 2012-04-06 DIAGNOSIS — J9601 Acute respiratory failure with hypoxia: Secondary | ICD-10-CM

## 2012-04-06 DIAGNOSIS — Z8614 Personal history of Methicillin resistant Staphylococcus aureus infection: Secondary | ICD-10-CM

## 2012-04-06 DIAGNOSIS — I2510 Atherosclerotic heart disease of native coronary artery without angina pectoris: Secondary | ICD-10-CM

## 2012-04-06 DIAGNOSIS — I421 Obstructive hypertrophic cardiomyopathy: Secondary | ICD-10-CM | POA: Diagnosis present

## 2012-04-06 DIAGNOSIS — I447 Left bundle-branch block, unspecified: Secondary | ICD-10-CM | POA: Diagnosis present

## 2012-04-06 DIAGNOSIS — R791 Abnormal coagulation profile: Secondary | ICD-10-CM | POA: Diagnosis present

## 2012-04-06 DIAGNOSIS — D509 Iron deficiency anemia, unspecified: Secondary | ICD-10-CM | POA: Diagnosis present

## 2012-04-06 DIAGNOSIS — I5032 Chronic diastolic (congestive) heart failure: Secondary | ICD-10-CM

## 2012-04-06 DIAGNOSIS — E119 Type 2 diabetes mellitus without complications: Secondary | ICD-10-CM | POA: Diagnosis present

## 2012-04-06 DIAGNOSIS — N289 Disorder of kidney and ureter, unspecified: Secondary | ICD-10-CM

## 2012-04-06 DIAGNOSIS — I5033 Acute on chronic diastolic (congestive) heart failure: Principal | ICD-10-CM

## 2012-04-06 DIAGNOSIS — I4891 Unspecified atrial fibrillation: Secondary | ICD-10-CM

## 2012-04-06 DIAGNOSIS — R0902 Hypoxemia: Secondary | ICD-10-CM

## 2012-04-06 LAB — CBC WITH DIFFERENTIAL/PLATELET
Basophils Absolute: 0 10*3/uL (ref 0.0–0.1)
Basophils Relative: 0 % (ref 0–1)
Eosinophils Absolute: 0.2 10*3/uL (ref 0.0–0.7)
Eosinophils Relative: 2 % (ref 0–5)
HCT: 35.8 % — ABNORMAL LOW (ref 36.0–46.0)
Lymphocytes Relative: 15 % (ref 12–46)
MCHC: 31 g/dL (ref 30.0–36.0)
MCV: 80.3 fL (ref 78.0–100.0)
Monocytes Absolute: 0.5 10*3/uL (ref 0.1–1.0)
Platelets: 193 10*3/uL (ref 150–400)
RDW: 16.8 % — ABNORMAL HIGH (ref 11.5–15.5)
WBC: 9.8 10*3/uL (ref 4.0–10.5)

## 2012-04-06 LAB — COMPREHENSIVE METABOLIC PANEL
ALT: 21 U/L (ref 0–35)
AST: 21 U/L (ref 0–37)
Albumin: 3.7 g/dL (ref 3.5–5.2)
Calcium: 11.8 mg/dL — ABNORMAL HIGH (ref 8.4–10.5)
Creatinine, Ser: 2.26 mg/dL — ABNORMAL HIGH (ref 0.50–1.10)
GFR calc non Af Amer: 21 mL/min — ABNORMAL LOW (ref >90)
Sodium: 137 mEq/L (ref 135–145)
Total Protein: 7.8 g/dL (ref 6.0–8.3)

## 2012-04-06 LAB — PRO B NATRIURETIC PEPTIDE: Pro B Natriuretic peptide (BNP): 1727 pg/mL — ABNORMAL HIGH (ref 0–125)

## 2012-04-06 LAB — PROTIME-INR
INR: 3.3 — ABNORMAL HIGH (ref 0.00–1.49)
Prothrombin Time: 31.7 seconds — ABNORMAL HIGH (ref 11.6–15.2)

## 2012-04-06 MED ORDER — NITROGLYCERIN 0.4 MG SL SUBL: 0.4000 mg | SUBLINGUAL_TABLET | SUBLINGUAL | Status: DC | PRN

## 2012-04-06 MED ORDER — ATORVASTATIN CALCIUM 20 MG PO TABS
20.0000 mg | ORAL_TABLET | Freq: Every day | ORAL | Status: DC
  Administered 2012-04-07 – 2012-04-08 (×2): 20 mg via ORAL
  Filled 2012-04-06 (×3): qty 1

## 2012-04-06 MED ORDER — ONDANSETRON HCL 4 MG PO TABS: 4.0000 mg | ORAL_TABLET | Freq: Four times a day (QID) | ORAL | Status: DC | PRN

## 2012-04-06 MED ORDER — ONDANSETRON HCL 4 MG/2ML IJ SOLN: 4.0000 mg | Freq: Three times a day (TID) | INTRAMUSCULAR | Status: DC | PRN

## 2012-04-06 MED ORDER — ONDANSETRON HCL 4 MG/2ML IJ SOLN: 4.0000 mg | Freq: Four times a day (QID) | INTRAMUSCULAR | Status: DC | PRN

## 2012-04-06 MED ORDER — METOPROLOL TARTRATE 25 MG PO TABS
25.0000 mg | ORAL_TABLET | Freq: Two times a day (BID) | ORAL | Status: DC
  Administered 2012-04-07 – 2012-04-09 (×5): 25 mg via ORAL
  Filled 2012-04-06 (×7): qty 1

## 2012-04-06 MED ORDER — ASPIRIN 81 MG PO CHEW
324.0000 mg | CHEWABLE_TABLET | Freq: Once | ORAL | Status: AC
  Administered 2012-04-06: 324 mg via ORAL
  Filled 2012-04-06: qty 4

## 2012-04-06 MED ORDER — ACETAMINOPHEN 325 MG PO TABS: 650.0000 mg | ORAL_TABLET | Freq: Four times a day (QID) | ORAL | Status: DC | PRN

## 2012-04-06 MED ORDER — SODIUM CHLORIDE 0.9 % IJ SOLN
3.0000 mL | Freq: Two times a day (BID) | INTRAMUSCULAR | Status: DC
  Administered 2012-04-06 – 2012-04-09 (×6): 3 mL via INTRAVENOUS

## 2012-04-06 MED ORDER — SODIUM CHLORIDE 0.9 % IJ SOLN
3.0000 mL | Freq: Two times a day (BID) | INTRAMUSCULAR | Status: DC
  Administered 2012-04-08 (×2): 3 mL via INTRAVENOUS

## 2012-04-06 MED ORDER — FUROSEMIDE 10 MG/ML IJ SOLN
40.0000 mg | Freq: Every day | INTRAMUSCULAR | Status: DC
  Administered 2012-04-07 – 2012-04-08 (×2): 40 mg via INTRAVENOUS
  Filled 2012-04-06 (×4): qty 4

## 2012-04-06 MED ORDER — PANTOPRAZOLE SODIUM 40 MG PO TBEC
40.0000 mg | DELAYED_RELEASE_TABLET | Freq: Every day | ORAL | Status: DC
  Administered 2012-04-07 – 2012-04-09 (×3): 40 mg via ORAL
  Filled 2012-04-06 (×3): qty 1

## 2012-04-06 MED ORDER — AMIODARONE HCL 200 MG PO TABS
200.0000 mg | ORAL_TABLET | Freq: Every day | ORAL | Status: DC
  Administered 2012-04-07 – 2012-04-09 (×3): 200 mg via ORAL
  Filled 2012-04-06 (×3): qty 1

## 2012-04-06 MED ORDER — FUROSEMIDE 10 MG/ML IJ SOLN
60.0000 mg | Freq: Once | INTRAMUSCULAR | Status: AC
  Administered 2012-04-06: 60 mg via INTRAVENOUS
  Filled 2012-04-06: qty 6

## 2012-04-06 MED ORDER — AMLODIPINE BESYLATE 5 MG PO TABS
5.0000 mg | ORAL_TABLET | Freq: Every day | ORAL | Status: DC
  Administered 2012-04-07 – 2012-04-09 (×3): 5 mg via ORAL
  Filled 2012-04-06 (×3): qty 1

## 2012-04-06 MED ORDER — ACETAMINOPHEN 650 MG RE SUPP: 650.0000 mg | Freq: Four times a day (QID) | RECTAL | Status: DC | PRN

## 2012-04-06 NOTE — ED Notes (Signed)
EKg shown to Dr. Manus Gunning, copies in the chart

## 2012-04-06 NOTE — H&P (Signed)
[name] is an 71 y.o. female.  Patient was seen and examined on April 06, 2012. Patient is to follow with Dr. Oliver Barre. Cardiologist - Dr. Antoine Poche.  Chief Complaint: Shortness of breath. HPI: 70 year-old female with history of hypertropic obstructive cardiomyopathy, atrial fibrillation, chronic kidney disease stage IV presents with complaints of shortness of breath. This evening after having Thanksgiving dinner patient was on her way back home when patient became short of breath suddenly. Denies any fever chills productive cough or chest pain. Chest x-ray the ER showed congestion. Patient was given Lasix 60 mg IV one dose and at this time patient's shortness of breath has significantly improved after diuresis. Patient's EKG was showing LBBB and ER physician had discussed with Dr. Milas Kocher cardiologist on call and as per the cardiologist patient does have transient LBBB previously. Patient will be admitted for further management.  Past Medical History  Diagnosis Date  . CAD (coronary artery disease)     cath 11/11: LAD 40%, mid-dist 25-30%; prox CFX 30%, mid 40%; OM 50%; PDA 50%; PLV 50%;  EF 65%  . Acute myocardial infarction     in setting of AFib with RVR 03/2010  . Atrial fibrillation     DCCV 11/17/11; coumadin & amiodarone  . HOCM (hypertrophic obstructive cardiomyopathy)     Echo 11/2011:severe LVH, EF 65-70%, mid-cavity gradient up to  . Hypertension   . Tobacco abuse   . Chronic kidney disease     Stage 4  . CHF (congestive heart failure)     preserved LVF  . Iron deficiency anemia   . Third degree uterine prolapse   . Hx MRSA infection     Past Surgical History  Procedure Date  . None   . Cardioversion 11/17/2011    Procedure: CARDIOVERSION;  Surgeon: Hillis Range, MD;  Location: [facility] OR;  Service: Cardiovascular;  Laterality: N/A;    Family History  Problem Relation Age of Onset  . Coronary artery disease Neg Hx   . Atrial fibrillation Neg Hx    Social  History:  reports that she quit smoking about 2 months ago. Her smoking use included Cigarettes. She has a 7.5 pack-year smoking history. She has never used smokeless tobacco. She reports that she does not drink alcohol or use illicit drugs.  Allergies: No Known Allergies  Medications Prior to Admission  Medication Sig Dispense Refill  . amiodarone (PACERONE) 200 MG tablet Take 200 mg by mouth daily.       Marland Kitchen amLODipine (NORVASC) 5 MG tablet Take 1 tablet (5 mg total) by mouth daily.  30 tablet  11  . furosemide (LASIX) 40 MG tablet Take 40 mg by mouth daily.      . metoprolol tartrate (LOPRESSOR) 25 MG tablet Take 25 mg by mouth 2 (two) times daily.      . nitroGLYCERIN (NITROSTAT) 0.4 MG SL tablet Place 0.4 mg under the tongue every 5 (five) minutes as needed. For chest pain      . pantoprazole (PROTONIX) 40 MG tablet Take 40 mg by mouth daily.      . rosuvastatin (CRESTOR) 10 MG tablet Take 10 mg by mouth at bedtime.       Marland Kitchen warfarin (COUMADIN) 5 MG tablet Take 2.5-5 mg by mouth every evening. 2.5 mg Monday, Wednesday, Friday; 5mg  Tuesday, Thursday, Saturday, Sunday        Results for orders placed during the hospital encounter of 04/06/12 (from the past 48 hour(s))  CBC WITH  DIFFERENTIAL     Status: Abnormal   Collection Time   04/06/12  7:57 PM      Component Value Range Comment   WBC 9.8  4.0 - 10.5 K/uL    RBC 4.46  3.87 - 5.11 MIL/uL    Hemoglobin 11.1 (*) 12.0 - 15.0 g/dL    HCT 16.1 (*) 09.6 - 46.0 %    MCV 80.3  78.0 - 100.0 fL    MCH 24.9 (*) 26.0 - 34.0 pg    MCHC 31.0  30.0 - 36.0 g/dL    RDW 04.5 (*) 40.9 - 15.5 %    Platelets 193  150 - 400 K/uL    Neutrophils Relative 78 (*) 43 - 77 %    Neutro Abs 7.7  1.7 - 7.7 K/uL    Lymphocytes Relative 15  12 - 46 %    Lymphs Abs 1.5  0.7 - 4.0 K/uL    Monocytes Relative 5  3 - 12 %    Monocytes Absolute 0.5  0.1 - 1.0 K/uL    Eosinophils Relative 2  0 - 5 %    Eosinophils Absolute 0.2  0.0 - 0.7 K/uL    Basophils Relative  0  0 - 1 %    Basophils Absolute 0.0  0.0 - 0.1 K/uL   COMPREHENSIVE METABOLIC PANEL     Status: Abnormal   Collection Time   04/06/12  7:57 PM      Component Value Range Comment   Sodium 137  135 - 145 mEq/L    Potassium 3.1 (*) 3.5 - 5.1 mEq/L    Chloride 100  96 - 112 mEq/L    CO2 24  19 - 32 mEq/L    Glucose, Bld 314 (*) 70 - 99 mg/dL    BUN 16  6 - 23 mg/dL    Creatinine, Ser 8.11 (*) 0.50 - 1.10 mg/dL    Calcium 91.4 (*) 8.4 - 10.5 mg/dL    Total Protein 7.8  6.0 - 8.3 g/dL    Albumin 3.7  3.5 - 5.2 g/dL    AST 21  0 - 37 U/L    ALT 21  0 - 35 U/L    Alkaline Phosphatase 183 (*) 39 - 117 U/L    Total Bilirubin 0.3  0.3 - 1.2 mg/dL    GFR calc non Af Amer 21 (*) >90 mL/min    GFR calc Af Amer 24 (*) >90 mL/min   TROPONIN I     Status: Normal   Collection Time   04/06/12  7:57 PM      Component Value Range Comment   Troponin I <0.30  <0.30 ng/mL   PRO B NATRIURETIC PEPTIDE     Status: Abnormal   Collection Time   04/06/12  7:57 PM      Component Value Range Comment   Pro B Natriuretic peptide (BNP) 1727.0 (*) 0 - 125 pg/mL   PROTIME-INR     Status: Abnormal   Collection Time   04/06/12  7:57 PM      Component Value Range Comment   Prothrombin Time 31.7 (*) 11.6 - 15.2 seconds    INR 3.30 (*) 0.00 - 1.49    Dg Chest Portable 1 View  04/06/2012  *RADIOLOGY REPORT*  Clinical Data: Shortness of breath.  PORTABLE CHEST - 1 VIEW  Comparison: Chest x-ray 03/15/2012.  Findings: Previously noted IJ catheter has been removed. There is cephalization of the pulmonary vasculature and slight indistinctness  of the interstitial markings suggestive of mild pulmonary edema.  No definite pleural effusions.  Mild cardiomegaly is unchanged. The patient is rotated to the right on today's exam, resulting in distortion of the mediastinal contours and reduced diagnostic sensitivity and specificity for mediastinal pathology. Atherosclerosis in the thoracic aorta.  IMPRESSION: 1.  Findings suggest  mild congestive heart failure, as above. 2.  Atherosclerosis.   Original Report Authenticated By: Trudie Reed, M.D.     Review of Systems  Constitutional: Negative.   HENT: Negative.   Eyes: Negative.   Respiratory: Positive for shortness of breath.   Cardiovascular: Negative.   Gastrointestinal: Negative.   Genitourinary: Negative.   Musculoskeletal: Negative.   Skin: Negative.   Neurological: Negative.   Endo/Heme/Allergies: Negative.   Psychiatric/Behavioral: Negative.     Blood pressure 124/71, pulse 63, temperature 98.5 F (36.9 C), temperature source Oral, resp. rate 22, SpO2 100.00%. Physical Exam  Constitutional: She is oriented to person, place, and time. She appears well-developed and well-nourished. No distress.  HENT:  Head: Normocephalic and atraumatic.  Right Ear: External ear normal.  Left Ear: External ear normal.  Nose: Nose normal.  Mouth/Throat: Oropharynx is clear and moist. No oropharyngeal exudate.  Eyes: Conjunctivae normal are normal. Pupils are equal, round, and reactive to light. Right eye exhibits no discharge. Left eye exhibits no discharge. No scleral icterus.  Neck: Normal range of motion. Neck supple.  Cardiovascular: Normal rate.   Respiratory: Effort normal and breath sounds normal. No respiratory distress. She has no wheezes. She has no rales.  GI: Soft. Bowel sounds are normal. She exhibits no distension. There is no tenderness. There is no rebound and no guarding.  Musculoskeletal: She exhibits no edema and no tenderness.  Neurological: She is alert and oriented to person, place, and time.       Moves all extremities.  Skin: Skin is warm and dry. She is not diaphoretic.     Assessment/Plan #1. Decompensated CHF with history of hypertrophic obstructive cardiomyopathy with last EF measured July 2013 was 65-70% - we'll continue with Lasix 40 mg IV daily and close followup of intake output and metabolic panel. #2. Atrial fibrillation on  Coumadin - rate controlled. Coumadin per pharmacy. #3. Chronic kidney disease stage IV - mildly elevated creatinine from baseline. Closely follow metabolic panel. Check UA. #4. Iron deficiency anemia - follow CBC. #5. Hyperlipidemia - continue statins. #6. Recently intubated twice for acute pulmonary edema.  CODE STATUS - full code.  Hans Rusher N. 04/06/2012, 11:10 PM

## 2012-04-06 NOTE — ED Provider Notes (Signed)
History     CSN: [id number]  Arrival date & time 04/06/12  4098   First MD Initiated Contact with Patient 04/06/12 1947      Chief Complaint  Patient presents with  . Shortness of Breath    (Consider location/radiation/quality/duration/timing/severity/associated sxs/prior treatment) HPI Comments: Patient presents from home with acute onset of shortness of breath that started a few hours ago. She has a history of hypertrophic obstructive cardiomyopathy with 2 intubations in the past month. She states compliance with her medications. She denies chest pain, cough or fever. She admits the eating high salt foods including green beans macaroni and cheese. She is found to be hypoxic but in no acute distress and speaking in full sentences.  PMH relevant for CAD, AMI, A. Fib with RVR, severe HOCM, HTN, CKD, CHF with prior intubations and MRSA infection  The history is provided by the patient.    Past Medical History  Diagnosis Date  . CAD (coronary artery disease)     cath 11/11: LAD 40%, mid-dist 25-30%; prox CFX 30%, mid 40%; OM 50%; PDA 50%; PLV 50%;  EF 65%  . Acute myocardial infarction     in setting of AFib with RVR 03/2010  . Atrial fibrillation     DCCV 11/17/11; coumadin & amiodarone  . HOCM (hypertrophic obstructive cardiomyopathy)     Echo 11/2011:severe LVH, EF 65-70%, mid-cavity gradient up to  . Hypertension   . Tobacco abuse   . Chronic kidney disease     Stage 4  . CHF (congestive heart failure)     preserved LVF  . Iron deficiency anemia   . Third degree uterine prolapse   . Hx MRSA infection     Past Surgical History  Procedure Date  . None   . Cardioversion 11/17/2011    Procedure: CARDIOVERSION;  Surgeon: Hillis Range, MD;  Location: [facility] OR;  Service: Cardiovascular;  Laterality: N/A;    Family History  Problem Relation Age of Onset  . Coronary artery disease Neg Hx   . Atrial fibrillation Neg Hx     History  Substance Use Topics  . Smoking  status: Former Smoker -- 0.2 packs/day for 30 years    Types: Cigarettes    Quit date: 02/02/2012  . Smokeless tobacco: Never Used     Comment: quit 01/2012  . Alcohol Use: No    OB History    Grav Para Term Preterm Abortions TAB SAB Ect Mult Living                  Review of Systems  Constitutional: Negative for fever and activity change.  HENT: Negative for congestion and rhinorrhea.   Respiratory: Positive for shortness of breath. Negative for cough.   Cardiovascular: Negative for chest pain.  Gastrointestinal: Negative for nausea, vomiting, abdominal pain and anal bleeding.  Genitourinary: Negative for dysuria.  Neurological: Negative for headaches.    Allergies  Review of patient's allergies indicates no known allergies.  Home Medications   No current outpatient prescriptions on file.  BP 129/74  Pulse 62  Temp 98 F (36.7 C) (Oral)  Resp 20  Ht 5\' 3"  (1.6 m)  Wt 179 lb (81.194 kg)  BMI 31.71 kg/m2  SpO2 99%  Physical Exam  Constitutional: She is oriented to person, place, and time. She appears well-developed and well-nourished. She appears distressed.  HENT:  Head: Normocephalic and atraumatic.  Mouth/Throat: Oropharynx is clear and moist. No oropharyngeal exudate.  Eyes: Conjunctivae normal and EOM  are normal.  Neck: Normal range of motion. Neck supple.  Cardiovascular: Normal rate, regular rhythm and normal heart sounds.   No murmur heard. Pulmonary/Chest: Breath sounds normal. She is in respiratory distress.       Decreased breath sounds at bases  Abdominal: Soft. There is no tenderness. There is no rebound and no guarding.  Musculoskeletal: Normal range of motion. She exhibits no edema and no tenderness.  Neurological: She is alert and oriented to person, place, and time. No cranial nerve deficit. Coordination normal.  Skin: Skin is warm.    ED Course  Procedures (including critical care time)  Labs Reviewed  CBC WITH DIFFERENTIAL - Abnormal;  Notable for the following:    Hemoglobin 11.1 (*)     HCT 35.8 (*)     MCH 24.9 (*)     RDW 16.8 (*)     Neutrophils Relative 78 (*)     All other components within normal limits  COMPREHENSIVE METABOLIC PANEL - Abnormal; Notable for the following:    Potassium 3.1 (*)     Glucose, Bld 314 (*)     Creatinine, Ser 2.26 (*)     Calcium 11.8 (*)     Alkaline Phosphatase 183 (*)     GFR calc non Af Amer 21 (*)     GFR calc Af Amer 24 (*)     All other components within normal limits  PRO B NATRIURETIC PEPTIDE - Abnormal; Notable for the following:    Pro B Natriuretic peptide (BNP) 1727.0 (*)     All other components within normal limits  PROTIME-INR - Abnormal; Notable for the following:    Prothrombin Time 31.7 (*)     INR 3.30 (*)     All other components within normal limits  TROPONIN I  TROPONIN I  TROPONIN I  TROPONIN I  BASIC METABOLIC PANEL  CBC  PROTIME-INR  MRSA PCR SCREENING   Dg Chest Portable 1 View  04/06/2012  *RADIOLOGY REPORT*  Clinical Data: Shortness of breath.  PORTABLE CHEST - 1 VIEW  Comparison: Chest x-ray 03/15/2012.  Findings: Previously noted IJ catheter has been removed. There is cephalization of the pulmonary vasculature and slight indistinctness of the interstitial markings suggestive of mild pulmonary edema.  No definite pleural effusions.  Mild cardiomegaly is unchanged. The patient is rotated to the right on today's exam, resulting in distortion of the mediastinal contours and reduced diagnostic sensitivity and specificity for mediastinal pathology. Atherosclerosis in the thoracic aorta.  IMPRESSION: 1.  Findings suggest mild congestive heart failure, as above. 2.  Atherosclerosis.   Original Report Authenticated By: Trudie Reed, M.D.      1. CHF (congestive heart failure)   2. Hypoxia   3. Chronic diastolic heart failure   4. Flash pulmonary edema   5. HYPERTENSION   6. CAD   7. Atrial fibrillation   8. Chronic kidney disease, stage V     9. Dyslipidemia       MDM  History of hypertrophic cardiomyopathy presenting with acute onset of shortness of breath with hypoxia. No chest pain, fever or chills.  Minimal respiratory distress, decreased breath sounds bilaterally. Recent concerning presentations with hypertensive emergency and pulmonary edema requiring intubation. However, appears stable today with mild hypoxia and tachypnea.  Lasix, O2 given. No NTG in setting of HCOM.  EKG discuss with Dr. Gala Romney on arrival. Patient has had intermittent left bundle branch block in the past and she is not having a chest pain.  No plans for urgent intervention.    Date: 04/06/2012  Rate: 80  Rhythm: normal sinus rhythm  QRS Axis: normal  Intervals: normal  ST/T Wave abnormalities: nonspecific ST/T changes  Conduction Disutrbances:left bundle branch block  Narrative Interpretation:   Old EKG Reviewed: changes noted    Glynn Octave, MD 04/07/12 [phone]

## 2012-04-06 NOTE — ED Notes (Signed)
Dr. Estell Harpin states pt not appropriate for FT. Wants pt moved to higher acuity area.

## 2012-04-06 NOTE — Progress Notes (Signed)
ANTICOAGULATION CONSULT NOTE - Initial Consult  Pharmacy Consult for Coumadin Indication: atrial fibrillation  No Known Allergies  Patient Measurements: Weight: 179 lb (81.194 kg) (scale a)   Vital Signs: Temp: 98 F (36.7 C) (11/28 2311) Temp src: Oral (11/28 2311) BP: 129/74 mmHg (11/28 2311) Pulse Rate: 62  (11/28 2311)  Labs:  [facility] 04/06/12 1957  HGB 11.1*  HCT 35.8*  PLT 193  APTT --  LABPROT 31.7*  INR 3.30*  HEPARINUNFRC --  CREATININE 2.26*  CKTOTAL --  CKMB --  TROPONINI <0.30    The CrCl is unknown because both a height and weight (above a minimum accepted value) are required for this calculation.   Medical History: Past Medical History  Diagnosis Date  . CAD (coronary artery disease)     cath 11/11: LAD 40%, mid-dist 25-30%; prox CFX 30%, mid 40%; OM 50%; PDA 50%; PLV 50%;  EF 65%  . Acute myocardial infarction     in setting of AFib with RVR 03/2010  . Atrial fibrillation     DCCV 11/17/11; coumadin & amiodarone  . HOCM (hypertrophic obstructive cardiomyopathy)     Echo 11/2011:severe LVH, EF 65-70%, mid-cavity gradient up to  . Hypertension   . Tobacco abuse   . Chronic kidney disease     Stage 4  . CHF (congestive heart failure)     preserved LVF  . Iron deficiency anemia   . Third degree uterine prolapse   . Hx MRSA infection     Medications:  Scheduled:    . amiodarone  200 mg Oral Daily  . amLODipine  5 mg Oral Daily  . [COMPLETED] aspirin  324 mg Oral Once  . atorvastatin  20 mg Oral q1800  . furosemide  40 mg Intravenous Daily  . [COMPLETED] furosemide  60 mg Intravenous Once  . metoprolol tartrate  25 mg Oral BID  . pantoprazole  40 mg Oral Daily  . sodium chloride  3 mL Intravenous Q12H  . sodium chloride  3 mL Intravenous Q12H    Assessment: 71 yo female with h/o atrial fibrillation who presented with shortness of breath. Pharmacy consulted to manage Coumadin. INR 3.3.   Goal of Therapy:  INR 2-3 Monitor  platelets by anticoagulation protocol: Yes   Plan:  1. No Coumadin for today. 2. Daily PT / INR  Emeline Gins 04/06/2012,11:33 PM

## 2012-04-06 NOTE — ED Notes (Addendum)
Patient with acute onset of shortness of breath when she walked into her house.  Patient unsure of what cause her sob, cta of her lung sounds. Patient walked into a house full of noise and she does not know if that what caused the SOB.  Patient states that she feels better and her breathing is back to normal.  Patient denies any chest pain

## 2012-04-06 NOTE — ED Notes (Signed)
Bedside commode at bedside. Pt states she will have daughter assist her, pt asked to use call bell if she needs assistance.

## 2012-04-07 DIAGNOSIS — I5033 Acute on chronic diastolic (congestive) heart failure: Secondary | ICD-10-CM

## 2012-04-07 DIAGNOSIS — E876 Hypokalemia: Secondary | ICD-10-CM

## 2012-04-07 LAB — CBC
HCT: 32.7 % — ABNORMAL LOW (ref 36.0–46.0)
Hemoglobin: 10.6 g/dL — ABNORMAL LOW (ref 12.0–15.0)
MCH: 26 pg (ref 26.0–34.0)
MCV: 80.3 fL (ref 78.0–100.0)
Platelets: 191 10*3/uL (ref 150–400)
RBC: 4.07 MIL/uL (ref 3.87–5.11)
WBC: 7.4 10*3/uL (ref 4.0–10.5)

## 2012-04-07 LAB — BASIC METABOLIC PANEL
CO2: 26 mEq/L (ref 19–32)
Calcium: 11.4 mg/dL — ABNORMAL HIGH (ref 8.4–10.5)
Chloride: 105 mEq/L (ref 96–112)
Glucose, Bld: 233 mg/dL — ABNORMAL HIGH (ref 70–99)
Potassium: 3.4 mEq/L — ABNORMAL LOW (ref 3.5–5.1)
Sodium: 142 mEq/L (ref 135–145)

## 2012-04-07 LAB — TROPONIN I: Troponin I: <0.3 ng/mL (ref <0.30)

## 2012-04-07 LAB — CALCIUM, IONIZED: Calcium, Ion: 1.54 mmol/L — ABNORMAL HIGH (ref 1.12–1.32)

## 2012-04-07 MED ORDER — DOCUSATE SODIUM 100 MG PO CAPS
100.0000 mg | ORAL_CAPSULE | Freq: Two times a day (BID) | ORAL | Status: DC
  Administered 2012-04-07 – 2012-04-09 (×4): 100 mg via ORAL
  Filled 2012-04-07 (×5): qty 1

## 2012-04-07 MED ORDER — WARFARIN - PHARMACIST DOSING INPATIENT: Freq: Every day | Status: DC

## 2012-04-07 MED ORDER — WARFARIN SODIUM 1 MG PO TABS
1.0000 mg | ORAL_TABLET | Freq: Once | ORAL | Status: AC
  Administered 2012-04-07: 1 mg via ORAL
  Filled 2012-04-07: qty 1

## 2012-04-07 MED ORDER — POTASSIUM CHLORIDE 20 MEQ/15ML (10%) PO LIQD
20.0000 meq | Freq: Once | ORAL | Status: AC
  Administered 2012-04-07: 20 meq via ORAL
  Filled 2012-04-07 (×2): qty 15

## 2012-04-07 NOTE — Progress Notes (Signed)
[facility] PROGRESS NOTE  [name] RUE:[id number] DOB: 11-11-1940 DOA: 04/06/2012 PCP: Oliver Barre, MD  Assessment/Plan: Acute on chronic diastolic CHF -Patient does not weigh herself at home -Continue IV furosemide -Daily weights and strict I.'s and O.'s -Suspect decompensation due to uncontrolled hypertension -Ejection fraction 65-70% Atrial fibrillation -Currently in sinus, rate controlled -Continue amiodarone -Continue metoprolol tartrate -Warfarin dosing per pharmacy Hypercalcemia -Patient is unaware of previous history of hypercalcemia, but records reveal multiple previous episodes of hypercalcemia -Check ionized calcium, intact PTH -Suspect primary hyperparathyroidism -Check TSH Transient left bundle branch block -Troponins negative -Repeat EKG -Optimize electrolytes -Replete potassium Iron deficiency anemia -Patient does not appear to be on supplemental iron -Check iron studies Hypertension -Continue amlodipine, metoprolol tartrate CKD stage IV -Creatinine is near baseline -Continue to monitor Hyperlipidemia -Continue Lipitor     Disposition Plan:   Home when medically stable      Procedures/Studies: Dg Chest 2 View  03/15/2012  *RADIOLOGY REPORT*  Clinical Data: Cough, shortness of breath, follow up of congestive heart failure  CHEST - 2 VIEW  Comparison: Portable chest x-ray of 03/14/2012  Findings: The lungs remain hyperaerated consistent with COPD. Cardiomegaly and prominent interstitial markings remain most consistent with mild interstitial edema.  The right IJ central venous line tip overlies the region of the right atrium.  The bones are osteopenic.  IMPRESSION:  1.  Persistent cardiomegaly with moderate pulmonary vascular congestion. 2.  Right IJ central venous line tip overlies the right atrium. 3.  COPD.   Original Report Authenticated By: Dwyane Dee, M.D.    Dg Chest Portable 1 View  04/06/2012  *RADIOLOGY REPORT*  Clinical Data:  Shortness of breath.  PORTABLE CHEST - 1 VIEW  Comparison: Chest x-ray 03/15/2012.  Findings: Previously noted IJ catheter has been removed. There is cephalization of the pulmonary vasculature and slight indistinctness of the interstitial markings suggestive of mild pulmonary edema.  No definite pleural effusions.  Mild cardiomegaly is unchanged. The patient is rotated to the right on today's exam, resulting in distortion of the mediastinal contours and reduced diagnostic sensitivity and specificity for mediastinal pathology. Atherosclerosis in the thoracic aorta.  IMPRESSION: 1.  Findings suggest mild congestive heart failure, as above. 2.  Atherosclerosis.   Original Report Authenticated By: Trudie Reed, M.D.    Dg Chest Port 1 View  03/14/2012  *RADIOLOGY REPORT*  Clinical Data: CHF  PORTABLE CHEST - 1 VIEW  Comparison: Yesterday  Findings: Endotracheal and NG tubes removed.  Stable central venous catheter.  Cardiomegaly persists.  Bibasilar haziness likely a combination of pleural fluid and volume loss has improved.  No pneumothorax.  IMPRESSION: Extubated.  Improved bibasilar haziness as described.   Original Report Authenticated By: Jolaine Click, M.D.    Dg Chest Port 1 View  03/13/2012  *RADIOLOGY REPORT*  Clinical Data: 71 year old female respiratory distress.  Shortness of breath and cough.  PORTABLE CHEST - 1 VIEW  Comparison: 03/11/2012 and earlier.  Findings: Portable AP semi upright view 2028 hours.  Right IJ central line tip continues to project at the right atrium.  The patient has been extubated with enteric tube removed.  Lower lung volumes.  Pulmonary vascularity remains increased but appears improved from comparison.  Crowding of markings at both bases.  No pneumothorax or large effusion.  No definite consolidation. Stable cardiomegaly and mediastinal contours.  IMPRESSION: 1.  Extubated and enteric tube removed. 2.  Lower lung volumes with mildly decreased bibasilar opacity. 3.   Pulmonary edema appears decreased. 4.  Stable right IJ central line allowing for lower lung volumes, tip at the right atrium level.   Original Report Authenticated By: Erskine Speed, M.D.    Dg Chest Port 1 View  03/13/2012  *RADIOLOGY REPORT*  Clinical Data: Check endotracheal tube  PORTABLE CHEST - 1 VIEW  Comparison: 03/11/2012  Findings: Cardiomegaly again noted.  Central vascular congestion. Stable NG tube position.  Stable right IJ central line position with tip in the right atrium.  Endotracheal tube in place with tip 4.7 cm above the carina.  Probable small bilateral pleural effusion with bilateral basilar atelectasis or infiltrate.  IMPRESSION: Stable support apparatus.  Central vascular congestion.  Probable small bilateral pleural effusion with bilateral basilar atelectasis or infiltrate.   Original Report Authenticated By: Natasha Mead, M.D.    Dg Chest Portable 1 View  03/11/2012  *RADIOLOGY REPORT*  Clinical Data: Respiratory distress.  PORTABLE CHEST - 1 VIEW  Comparison: 03/19/2012 and 1924 hours.  Findings: Endotracheal tube with tip about 5 cm above the carina. Enteric tube tip is in the left upper quadrant, likely in the mid stomach.  Interval placement of a right internal jugular central venous catheter with tip over the upper right atrium.  Cardiac enlargement with pulmonary vascular congestion and diffuse interstitial edema.  No blunting of costophrenic angles.  No pneumothorax.  IMPRESSION: Appliances positioned as described.  Persistent cardiac enlargement with pulmonary vascular congestion and diffuse edema.   Original Report Authenticated By: Burman Nieves, M.D.    Dg Chest Portable 1 View  03/11/2012  *RADIOLOGY REPORT*  Clinical Data: Respiratory distress.  PORTABLE CHEST - 1 VIEW  Comparison: 01/31/2012  Findings: Endotracheal tube is 3 cm above the carina.  NG tube enters the stomach.  Cardiomegaly.  Diffuse bilateral airspace disease, increased since prior study compatible with  worsening edema.  No effusions.  No acute bony abnormality.  IMPRESSION: Moderate CHF pattern.   Original Report Authenticated By: Charlett Nose, M.D.          Subjective: Patient is feeling better this morning. She denies any chest pain, shortness breath, nausea, dizziness, vomiting, abdominal pain, dysuria, hematuria, diarrhea. No fevers or chills. No coughing or hemoptysis.  Objective: Filed Vitals:   04/06/12 2145 04/06/12 2223 04/06/12 2311 04/07/12 0513  BP:  124/71 129/74 109/76  Pulse: 64 63 62 58  Temp:   98 F (36.7 C) 98.7 F (37.1 C)  TempSrc:   Oral Oral  Resp: 21 22 20 20   Height:   5\' 3"  (1.6 m)   Weight:   81.194 kg (179 lb) 80.8 kg (178 lb 2.1 oz)  SpO2: 100% 100% 99% 100%    Intake/Output Summary (Last 24 hours) at 04/07/12 9562 Last data filed at 04/06/12 2227  Gross per 24 hour  Intake      0 ml  Output    300 ml  Net   -300 ml   Weight change:  Exam:   General:  Pt is alert, follows commands appropriately, not in acute distress  HEENT: No icterus, No thrush, No neck mass, [place]/AT--no thyromegaly  Cardiovascular: RRR, S1/S2, no rubs, no gallops  Respiratory: Fine bibasilar crackles. No wheezes or rhonchi.  Abdomen: Soft/+BS, non tender, non distended, no guarding  Extremities: trace edema, No lymphangitis, No petechiae, No rashes, no synovitis  Data Reviewed: Basic Metabolic Panel:  Lab 04/07/12 1308 04/06/12 1957  NA 142 137  K 3.4* 3.1*  CL 105 100  CO2 26 24  GLUCOSE 233* 314*  BUN  15 16  CREATININE 2.14* 2.26*  CALCIUM 11.4* 11.8*  MG -- --  PHOS -- --   Liver Function Tests:  Lab 04/06/12 1957  AST 21  ALT 21  ALKPHOS 183*  BILITOT 0.3  PROT 7.8  ALBUMIN 3.7   No results found for this basename: LIPASE:5,AMYLASE:5 in the last 168 hours No results found for this basename: AMMONIA:5 in the last 168 hours CBC:  Lab 04/07/12 0515 04/06/12 1957  WBC 7.4 9.8  NEUTROABS -- 7.7  HGB 10.6* 11.1*  HCT 32.7* 35.8*  MCV 80.3  80.3  PLT 191 193   Cardiac Enzymes:  Lab 04/07/12 0515 04/06/12 2327 04/06/12 1957  CKTOTAL -- -- --  CKMB -- -- --  CKMBINDEX -- -- --  TROPONINI <0.30 <0.30 <0.30   BNP: No components found with this basename: POCBNP:5 CBG: No results found for this basename: GLUCAP:5 in the last 168 hours  Recent Results (from the past 240 hour(s))  MRSA PCR SCREENING     Status: Normal   Collection Time   04/06/12 11:55 PM      Component Value Range Status Comment   MRSA by PCR NEGATIVE  NEGATIVE Final      Scheduled Meds:   . amiodarone  200 mg Oral Daily  . amLODipine  5 mg Oral Daily  . [COMPLETED] aspirin  324 mg Oral Once  . atorvastatin  20 mg Oral q1800  . furosemide  40 mg Intravenous Daily  . [COMPLETED] furosemide  60 mg Intravenous Once  . metoprolol tartrate  25 mg Oral BID  . pantoprazole  40 mg Oral Daily  . sodium chloride  3 mL Intravenous Q12H  . sodium chloride  3 mL Intravenous Q12H   Continuous Infusions:    Erica Brostrom, DO  [facility] Pager [phone]  If 7PM-7AM, please contact night-coverage [email or web address] Password [email or web address] 04/07/2012, 8:08 AM   LOS: 1 day

## 2012-04-07 NOTE — Progress Notes (Signed)
Utilization Review Completed.   Kamden Reber, RN, BSN Nurse Case Manager  [phone]  

## 2012-04-07 NOTE — Plan of Care (Signed)
Problem: Phase I Progression Outcomes Goal: EF % per last Echo/documented,Core Reminder form on chart Outcome: Completed/Met Date Met:  04/07/12 Last EF 65-70% per echo 11/16/2011

## 2012-04-07 NOTE — Plan of Care (Signed)
Problem: Consults Goal: Tobacco Cessation referral if indicated Outcome: Not Applicable Date Met:  04/07/12 Recently quit smoking 3 months ago

## 2012-04-07 NOTE — Progress Notes (Signed)
ANTICOAGULATION CONSULT NOTE   Pharmacy Consult for Coumadin Indication: atrial fibrillation  No Known Allergies  Patient Measurements: Height: 5\' 3"  (160 cm) Weight: 178 lb 2.1 oz (80.8 kg) (scale a) IBW/kg (Calculated) : 52.4    Vital Signs: Temp: 98.6 F (37 C) (11/29 1001) Temp src: Oral (11/29 1001) BP: 120/77 mmHg (11/29 1001) Pulse Rate: 62  (11/29 1001)  Labs:  Basename 04/07/12 0515 04/06/12 2327 04/06/12 1957  HGB 10.6* -- 11.1*  HCT 32.7* -- 35.8*  PLT 191 -- 193  APTT -- -- --  LABPROT 32.9* -- 31.7*  INR 3.47* -- 3.30*  HEPARINUNFRC -- -- --  CREATININE 2.14* -- 2.26*  CKTOTAL -- -- --  CKMB -- -- --  TROPONINI <0.30 <0.30 <0.30    Estimated Creatinine Clearance: 24.3 ml/min (by C-G formula based on Cr of 2.14).  Assessment: 71 yo female with h/o atrial fibrillation who presented with shortness of breath. Pharmacy consulted to manage Coumadin.  Her home dose is 2.5 mg MWF and 5 mg TTSS. Last dose 11.27.  She also takes amio 200 mg qday at home.  Her INR remains slightly elevated at 3.47 after no dose given 11.28.  CBC stable.  No bleeding reported.   Goal of Therapy:  INR 2-3   Plan:  1. Coumadin  1 mg po  today. 2. Daily PT / INR Herby Abraham, Pharm.D. [phone] 04/07/2012 10:38 AM

## 2012-04-08 DIAGNOSIS — I2510 Atherosclerotic heart disease of native coronary artery without angina pectoris: Secondary | ICD-10-CM

## 2012-04-08 LAB — PROTIME-INR
INR: 3.14 — ABNORMAL HIGH (ref 0.00–1.49)
Prothrombin Time: 30.6 seconds — ABNORMAL HIGH (ref 11.6–15.2)

## 2012-04-08 LAB — BASIC METABOLIC PANEL
Calcium: 11.7 mg/dL — ABNORMAL HIGH (ref 8.4–10.5)
GFR calc Af Amer: 26 mL/min — ABNORMAL LOW (ref >90)
GFR calc non Af Amer: 22 mL/min — ABNORMAL LOW (ref >90)
Glucose, Bld: 284 mg/dL — ABNORMAL HIGH (ref 70–99)
Potassium: 3.5 mEq/L (ref 3.5–5.1)
Sodium: 142 mEq/L (ref 135–145)

## 2012-04-08 MED ORDER — WARFARIN SODIUM 1 MG PO TABS
1.0000 mg | ORAL_TABLET | Freq: Once | ORAL | Status: AC
  Administered 2012-04-08: 1 mg via ORAL
  Filled 2012-04-08: qty 1

## 2012-04-08 NOTE — Progress Notes (Signed)
[facility] PROGRESS NOTE  [name]:[id number] DOB: March 14, 1941 DOA: 04/06/2012 PCP: Oliver Barre, MD  Assessment/Plan: Acute on chronic diastolic CHF  -Patient does not weigh herself at home  -Continue IV furosemide  -Daily weights and strict I.'s and O.'s  -neg 1427cc for the admission -Ejection fraction 65-70%  Atrial fibrillation  -Currently in sinus, rate controlled  -Continue amiodarone  -Continue metoprolol tartrate  -Warfarin dosing per pharmacy  Hypercalcemia  -Patient is unaware of previous history of hypercalcemia, but records reveal multiple previous episodes of hypercalcemia  -Check  intact PTH--pending -Suspect primary hyperparathyroidism  -Check TSH--result pending -Ionized calcium 1.5 Transient left bundle branch block  -Troponins negative  -Repeat EKG  -Optimize electrolytes  -Replete potassium  Iron deficiency anemia  -Patient does not appear to be on supplemental iron  -Check iron studies  Hypertension  -Continue amlodipine, metoprolol tartrate  CKD stage IV  -Creatinine is near baseline  -Continue to monitor  Hyperlipidemia  -Continue Lipitor (crestor at home)  hyperglycemia -Check hemoglobin A1c      Family Communication:   Pt at beside Disposition Plan:   Home when medically stable     Procedures/Studies: Dg Chest 2 View  03/15/2012  *RADIOLOGY REPORT*  Clinical Data: Cough, shortness of breath, follow up of congestive heart failure  CHEST - 2 VIEW  Comparison: Portable chest x-ray of 03/14/2012  Findings: The lungs remain hyperaerated consistent with COPD. Cardiomegaly and prominent interstitial markings remain most consistent with mild interstitial edema.  The right IJ central venous line tip overlies the region of the right atrium.  The bones are osteopenic.  IMPRESSION:  1.  Persistent cardiomegaly with moderate pulmonary vascular congestion. 2.  Right IJ central venous line tip overlies the right atrium. 3.  COPD.   Original  Report Authenticated By: Dwyane Dee, M.D.    Dg Chest Portable 1 View  04/06/2012  *RADIOLOGY REPORT*  Clinical Data: Shortness of breath.  PORTABLE CHEST - 1 VIEW  Comparison: Chest x-ray 03/15/2012.  Findings: Previously noted IJ catheter has been removed. There is cephalization of the pulmonary vasculature and slight indistinctness of the interstitial markings suggestive of mild pulmonary edema.  No definite pleural effusions.  Mild cardiomegaly is unchanged. The patient is rotated to the right on today's exam, resulting in distortion of the mediastinal contours and reduced diagnostic sensitivity and specificity for mediastinal pathology. Atherosclerosis in the thoracic aorta.  IMPRESSION: 1.  Findings suggest mild congestive heart failure, as above. 2.  Atherosclerosis.   Original Report Authenticated By: Trudie Reed, M.D.    Dg Chest Port 1 View  03/14/2012  *RADIOLOGY REPORT*  Clinical Data: CHF  PORTABLE CHEST - 1 VIEW  Comparison: Yesterday  Findings: Endotracheal and NG tubes removed.  Stable central venous catheter.  Cardiomegaly persists.  Bibasilar haziness likely a combination of pleural fluid and volume loss has improved.  No pneumothorax.  IMPRESSION: Extubated.  Improved bibasilar haziness as described.   Original Report Authenticated By: Jolaine Click, M.D.    Dg Chest Port 1 View  03/13/2012  *RADIOLOGY REPORT*  Clinical Data: 71 year old female respiratory distress.  Shortness of breath and cough.  PORTABLE CHEST - 1 VIEW  Comparison: 03/11/2012 and earlier.  Findings: Portable AP semi upright view 2028 hours.  Right IJ central line tip continues to project at the right atrium.  The patient has been extubated with enteric tube removed.  Lower lung volumes.  Pulmonary vascularity remains increased but appears improved from comparison.  Crowding of markings at  both bases.  No pneumothorax or large effusion.  No definite consolidation. Stable cardiomegaly and mediastinal contours.   IMPRESSION: 1.  Extubated and enteric tube removed. 2.  Lower lung volumes with mildly decreased bibasilar opacity. 3.  Pulmonary edema appears decreased. 4.  Stable right IJ central line allowing for lower lung volumes, tip at the right atrium level.   Original Report Authenticated By: Erskine Speed, M.D.    Dg Chest Port 1 View  03/13/2012  *RADIOLOGY REPORT*  Clinical Data: Check endotracheal tube  PORTABLE CHEST - 1 VIEW  Comparison: 03/11/2012  Findings: Cardiomegaly again noted.  Central vascular congestion. Stable NG tube position.  Stable right IJ central line position with tip in the right atrium.  Endotracheal tube in place with tip 4.7 cm above the carina.  Probable small bilateral pleural effusion with bilateral basilar atelectasis or infiltrate.  IMPRESSION: Stable support apparatus.  Central vascular congestion.  Probable small bilateral pleural effusion with bilateral basilar atelectasis or infiltrate.   Original Report Authenticated By: Natasha Mead, M.D.    Dg Chest Portable 1 View  03/11/2012  *RADIOLOGY REPORT*  Clinical Data: Respiratory distress.  PORTABLE CHEST - 1 VIEW  Comparison: 03/19/2012 and 1924 hours.  Findings: Endotracheal tube with tip about 5 cm above the carina. Enteric tube tip is in the left upper quadrant, likely in the mid stomach.  Interval placement of a right internal jugular central venous catheter with tip over the upper right atrium.  Cardiac enlargement with pulmonary vascular congestion and diffuse interstitial edema.  No blunting of costophrenic angles.  No pneumothorax.  IMPRESSION: Appliances positioned as described.  Persistent cardiac enlargement with pulmonary vascular congestion and diffuse edema.   Original Report Authenticated By: Burman Nieves, M.D.    Dg Chest Portable 1 View  03/11/2012  *RADIOLOGY REPORT*  Clinical Data: Respiratory distress.  PORTABLE CHEST - 1 VIEW  Comparison: 01/31/2012  Findings: Endotracheal tube is 3 cm above the carina.  NG  tube enters the stomach.  Cardiomegaly.  Diffuse bilateral airspace disease, increased since prior study compatible with worsening edema.  No effusions.  No acute bony abnormality.  IMPRESSION: Moderate CHF pattern.   Original Report Authenticated By: Charlett Nose, M.D.          Subjective: Patient states that she is breathing better. Denies any fevers, chills, chest pain, shortness breath, nausea, vomiting, diarrhea, abdominal pain. No dysuria or hematuria.  Objective: Filed Vitals:   04/07/12 1421 04/07/12 2107 04/08/12 0450 04/08/12 0947  BP: 121/69 129/73 109/52 110/60  Pulse: 57 59 62 64  Temp: 97.7 F (36.5 C) 97.7 F (36.5 C) 98.3 F (36.8 C)   TempSrc: Oral Oral Oral   Resp: 20 18 18    Height:      Weight:   80.4 kg (177 lb 4 oz)   SpO2: 100% 99% 98%     Intake/Output Summary (Last 24 hours) at 04/08/12 1317 Last data filed at 04/08/12 1258  Gross per 24 hour  Intake    950 ml  Output   1200 ml  Net   -250 ml   Weight change: -0.794 kg (-1 lb 12 oz) Exam:   General:  Pt is alert, follows commands appropriately, not in acute distress  HEENT: No icterus, No thrush,  [place]/AT--no thyromegaly  Cardiovascular: RRR, S1/S2, no rubs, no gallops  Respiratory: Bibasilar crackles. Diminished breath sounds throughout. No wheezes or rhonchi. Good air movement.  Abdomen: Soft/+BS, non tender, non distended, no guarding  Extremities: 1+  edema, No lymphangitis, No petechiae, No rashes, no synovitis  Data Reviewed: Basic Metabolic Panel:  Lab 04/08/12 1610 04/07/12 0515 04/06/12 1957  NA 142 142 137  K 3.5 3.4* 3.1*  CL 106 105 100  CO2 26 26 24   GLUCOSE 284* 233* 314*  BUN 19 15 16   CREATININE 2.13* 2.14* 2.26*  CALCIUM 11.7* 11.4* 11.8*  MG -- -- --  PHOS -- -- --   Liver Function Tests:  Lab 04/06/12 1957  AST 21  ALT 21  ALKPHOS 183*  BILITOT 0.3  PROT 7.8  ALBUMIN 3.7   No results found for this basename: LIPASE:5,AMYLASE:5 in the last 168 hours No  results found for this basename: AMMONIA:5 in the last 168 hours CBC:  Lab 04/07/12 0515 04/06/12 1957  WBC 7.4 9.8  NEUTROABS -- 7.7  HGB 10.6* 11.1*  HCT 32.7* 35.8*  MCV 80.3 80.3  PLT 191 193   Cardiac Enzymes:  Lab 04/07/12 1040 04/07/12 0515 04/06/12 2327 04/06/12 1957  CKTOTAL -- -- -- --  CKMB -- -- -- --  CKMBINDEX -- -- -- --  TROPONINI <0.30 <0.30 <0.30 <0.30   BNP: No components found with this basename: POCBNP:5 CBG: No results found for this basename: GLUCAP:5 in the last 168 hours  Recent Results (from the past 240 hour(s))  MRSA PCR SCREENING     Status: Normal   Collection Time   04/06/12 11:55 PM      Component Value Range Status Comment   MRSA by PCR NEGATIVE  NEGATIVE Final      Scheduled Meds:   . amiodarone  200 mg Oral Daily  . amLODipine  5 mg Oral Daily  . atorvastatin  20 mg Oral q1800  . docusate sodium  100 mg Oral BID  . furosemide  40 mg Intravenous Daily  . metoprolol tartrate  25 mg Oral BID  . pantoprazole  40 mg Oral Daily  . sodium chloride  3 mL Intravenous Q12H  . sodium chloride  3 mL Intravenous Q12H  . [COMPLETED] warfarin  1 mg Oral ONCE-1800  . warfarin  1 mg Oral ONCE-1800  . Warfarin - Pharmacist Dosing Inpatient   Does not apply q1800   Continuous Infusions:    Beautifull Cisar, DO  [facility] Pager [phone]  If 7PM-7AM, please contact night-coverage [email or web address] Password [email or web address] 04/08/2012, 1:17 PM   LOS: 2 days

## 2012-04-08 NOTE — Progress Notes (Signed)
ANTICOAGULATION CONSULT NOTE   Pharmacy Consult for Coumadin Indication: atrial fibrillation  No Known Allergies  Labs:  Basename 04/08/12 0530 04/07/12 1040 04/07/12 0515 04/06/12 2327 04/06/12 1957  HGB -- -- 10.6* -- 11.1*  HCT -- -- 32.7* -- 35.8*  PLT -- -- 191 -- 193  APTT -- -- -- -- --  LABPROT 30.6* -- 32.9* -- 31.7*  INR 3.14* -- 3.47* -- 3.30*  HEPARINUNFRC -- -- -- -- --  CREATININE 2.13* -- 2.14* -- 2.26*  CKTOTAL -- -- -- -- --  CKMB -- -- -- -- --  TROPONINI -- <0.30 <0.30 <0.30 --    Estimated Creatinine Clearance: 24.3 ml/min (by C-G formula based on Cr of 2.13).  Assessment: 71 yo female with h/o atrial fibrillation who presented with shortness of breath. Pharmacy consulted to manage Coumadin.   Her home dose is 2.5 mg MWF and 5 mg TTSS. Last dose 11.27.  She also takes amio 200 mg qday at home.   CBC stable.  No bleeding reported.   INR today still slightly elevated at 3.14  Goal of Therapy:  INR 2-3   Plan:  1. Repeat Coumadin  1 mg po  today. 2. Daily PT / INR  Thank you. Okey Regal, PharmD [phone]  04/08/2012 9:49 AM

## 2012-04-09 LAB — BASIC METABOLIC PANEL
CO2: 25 mEq/L (ref 19–32)
Calcium: 11.5 mg/dL — ABNORMAL HIGH (ref 8.4–10.5)
Creatinine, Ser: 2.24 mg/dL — ABNORMAL HIGH (ref 0.50–1.10)
GFR calc non Af Amer: 21 mL/min — ABNORMAL LOW (ref >90)
Sodium: 141 mEq/L (ref 135–145)

## 2012-04-09 LAB — PROTIME-INR
INR: 3.13 — ABNORMAL HIGH (ref 0.00–1.49)
Prothrombin Time: 30.5 seconds — ABNORMAL HIGH (ref 11.6–15.2)

## 2012-04-09 LAB — FERRITIN: Ferritin: 231 ng/mL (ref 10–291)

## 2012-04-09 LAB — IRON AND TIBC: Iron: 52 ug/dL (ref 42–135)

## 2012-04-09 MED ORDER — GLIPIZIDE 5 MG PO TABS: 5.0000 mg | ORAL_TABLET | Freq: Two times a day (BID) | ORAL | Status: DC

## 2012-04-09 MED ORDER — GLIPIZIDE 5 MG PO TABS: 5.0000 mg | ORAL_TABLET | Freq: Every day | ORAL | Status: DC

## 2012-04-09 MED ORDER — FERROUS SULFATE 325 (65 FE) MG PO TABS: 325.0000 mg | ORAL_TABLET | Freq: Every day | ORAL | Status: DC

## 2012-04-09 MED ORDER — FUROSEMIDE 40 MG PO TABS
40.0000 mg | ORAL_TABLET | Freq: Every day | ORAL | Status: DC
  Administered 2012-04-09: 40 mg via ORAL
  Filled 2012-04-09: qty 1

## 2012-04-09 NOTE — Discharge Summary (Addendum)
Physician Discharge Summary  [name]:[id number] DOB: Oct 29, 1940 DOA: 04/06/2012  PCP: Oliver Barre, MD  Admit date: 04/06/2012 Discharge date: 04/09/2012  Recommendations for Outpatient Follow-up:  1. Pt will need to follow up with PCP in 2 weeks post discharge 2. Please obtain BMP to evaluate electrolytes and kidney function 3. Followup on results of  intact PTH 4. Patient instructed to call and schedule appt at coumadin clinic for Monday, 04/10/2012  Discharge Diagnoses:  Acute on chronic diastolic CHF  -Patient does not weigh herself at home--instructed patient to weigh herself daily  -Continue home furosemide dose 40 mg daily -neg 1797cc for the admission  -Ejection fraction 65-70%  Atrial fibrillation  -Currently in sinus, rate controlled  -Continue amiodarone  -Continue metoprolol tartrate  -Hold warfarin--INR 3.13 on the day of discharge--no evidence of bleed -Patient instructed not to take warfarin tonight, call to make appointment at warfarin clinic for 04/10/2012 for further adjustment of her Coumadin Hypercalcemia  -Patient is unaware of previous history of hypercalcemia, but records reveal multiple previous episodes of hypercalcemia  -Check intact PTH--pending  -Suspect primary hyperparathyroidism  -Check TSH--result pending  -Ionized calcium 1.5  Diabetes mellitus type 2 -Start glipizide 5 mg daily -Close followup with primary care physician to titrate  -HbA1c 8.7 Transient left bundle branch block  -Troponins EKg negative  -Optimize electrolytes  -Replete potassium  Iron deficiency anemia  -Patient does not appear to be on supplemental iron  -Iron saturation 19%--start patient on ferrous sulfate Hypertension  -Continue amlodipine, metoprolol tartrate  CKD stage IV  -Creatinine is near baseline  -Continue to monitor  Hyperlipidemia  -Continue Lipitor (crestor at home)   Discharge Condition:   Disposition:  stable  Diet: Cardiac Wt Readings  from Last 3 Encounters:  04/09/12 80.3 kg (177 lb 0.5 oz)  03/23/12 84.823 kg (187 lb)  03/17/12 83.008 kg (183 lb)    History of present illness:  71 year-old female with history of hypertropic obstructive cardiomyopathy, atrial fibrillation, chronic kidney disease stage IV presents with complaints of shortness of breath. After having Thanksgiving dinner, patient was on her way back home when patient became short of breath suddenly. Denies any fever chills productive cough or chest pain. Chest x-ray the ER showed congestion. Patient was given Lasix 60 mg IV one dose and at this time patient's shortness of breath has significantly improved after diuresis. Patient's EKG was showing LBBB and ER physician had discussed with Dr. Milas Kocher cardiologist on call and as per the cardiologist patient does have transient LBBB previously.   Hospital Course:  The patient was continued with intravenous furosemide 40 mg daily while hospitalized. The patient continued to improve clinically. Her dyspnea improved. The patient was subsequently transitioned to oral furosemide back to her home dose. Regarding the patient's atrial fibrillation, the patient remained in sinus rhythm, rate controlled. She was continued on amiodarone and metoprolol tartrate. However, the patient was noted to be supratherapeutic with her warfarin. Her warfarin was held throughout the hospitalization. On the day of admission, the patient's INR was 3.47. It did improve back to 3.13 at the time of discharge. The patient was instructed to not to restart her warfarin tonight, 04/09/12, and have her INR checked at her primary care provider's office or cardiology office on Monday, 04/10/2012. Future adjustments to her warfarin dosing would be made by her primary care provider/cardiology. The patient was noted to have a left bundle branch block. It was confirmed with cardiology that she has had a history  of transient left bundle branch block. Repeat EKG did  show resolution of her left bundle branch block.  The patient was noted to be hypercalcemic at the time of admission. Review of her records revealed that she has been hypercalcemic in the past. Ionized calcium was 1.54. Intact PTH was checked, and it is pending at this time. She will need further workup regarding her hypercalcemia as an outpatient. The patient had an underlying diagnosis of iron deficiency anemia. Unfortunately, the patient was not taking any iron at this time. Iron studies were performed. Patient was started iron saturation of 90%. She was started on ferrous sulfate 325 mg daily.  The patient's renal function, serum creatinine, initially was 2.26 on the day of admission. It is improved to near baseline at the time of discharge. Review of the records did reveal that the patient had a baseline creatinine between 1.8-2.0. The patient was noted to be hyperglycemic during the hospitalization. Hemoglobin A1c was checked, and it was 8.7. The patient was started on glipizide 5 mg daily. She is instructed to follow up with her primary care provider who will titrate her medications and follow her diabetes.    Discharge Exam: Filed Vitals:   04/09/12 1004  BP: 120/57  Pulse: 62  Temp:   Resp:    Filed Vitals:   04/08/12 1426 04/08/12 2157 04/09/12 0522 04/09/12 1004  BP: 119/66 115/52 109/49 120/57  Pulse: 58 64 60 62  Temp: 98.7 F (37.1 C) 98.3 F (36.8 C) 98.7 F (37.1 C)   TempSrc: Oral Oral Oral   Resp: 20 20 20    Height:      Weight:   80.3 kg (177 lb 0.5 oz)   SpO2: 100% 100% 98%    General: A&O x 3, NAD, pleasant, cooperative Cardiovascular: RRR, no rub, no gallop, no S3 Respiratory: Fine bibasilar crackles. Diminished breath sounds bilaterally. No wheezes or rhonchi. Good air movement. Abdomen:soft, nontender, nondistended, positive bowel sounds Extremities: trace edema, No lymphangitis, no petechiae  Discharge Instructions      Discharge Orders    Future  Appointments: Provider: Department: Dept Phone: Center:   04/12/2012 11:30 AM [facility] SITE 3 NUCLEAR MED [phone] None   04/27/2012 12:00 PM Rollene Rotunda, MD [facility] Main Office [place]) [phone] [id number]     Future Orders Please Complete By Expires   Diet - low sodium heart healthy      Increase activity slowly      Discharge instructions      Comments:   DO NOT take coumadin tonight Call Dr. Jenene Slicker office to schedule appointment at coumadin clinic for check on 04/10/12--Their office will make further adjustments to coumadin and when to restart taking coumadin       Medication List     As of 04/09/2012 12:54 PM    STOP taking these medications         warfarin 5 MG tablet   Commonly known as: COUMADIN      TAKE these medications         amiodarone 200 MG tablet   Commonly known as: PACERONE   Take 200 mg by mouth daily.      amLODipine 5 MG tablet   Commonly known as: NORVASC   Take 1 tablet (5 mg total) by mouth daily.      ferrous sulfate 325 (65 FE) MG tablet   Take 1 tablet (325 mg total) by mouth daily with breakfast.  furosemide 40 MG tablet   Commonly known as: LASIX   Take 40 mg by mouth daily.      glipiZIDE 5 MG tablet   Commonly known as: GLUCOTROL   Take 1 tablet (5 mg total) by mouth daily.      metoprolol tartrate 25 MG tablet   Commonly known as: LOPRESSOR   Take 25 mg by mouth 2 (two) times daily.      nitroGLYCERIN 0.4 MG SL tablet   Commonly known as: NITROSTAT   Place 0.4 mg under the tongue every 5 (five) minutes as needed. For chest pain      pantoprazole 40 MG tablet   Commonly known as: PROTONIX   Take 40 mg by mouth daily.      rosuvastatin 10 MG tablet   Commonly known as: CRESTOR   Take 10 mg by mouth at bedtime.           The results of significant diagnostics from this hospitalization (including imaging, microbiology, ancillary and laboratory)  are listed below for reference.    Significant Diagnostic Studies: Dg Chest 2 View  03/15/2012  *RADIOLOGY REPORT*  Clinical Data: Cough, shortness of breath, follow up of congestive heart failure  CHEST - 2 VIEW  Comparison: Portable chest x-ray of 03/14/2012  Findings: The lungs remain hyperaerated consistent with COPD. Cardiomegaly and prominent interstitial markings remain most consistent with mild interstitial edema.  The right IJ central venous line tip overlies the region of the right atrium.  The bones are osteopenic.  IMPRESSION:  1.  Persistent cardiomegaly with moderate pulmonary vascular congestion. 2.  Right IJ central venous line tip overlies the right atrium. 3.  COPD.   Original Report Authenticated By: Dwyane Dee, M.D.    Dg Chest Portable 1 View  04/06/2012  *RADIOLOGY REPORT*  Clinical Data: Shortness of breath.  PORTABLE CHEST - 1 VIEW  Comparison: Chest x-ray 03/15/2012.  Findings: Previously noted IJ catheter has been removed. There is cephalization of the pulmonary vasculature and slight indistinctness of the interstitial markings suggestive of mild pulmonary edema.  No definite pleural effusions.  Mild cardiomegaly is unchanged. The patient is rotated to the right on today's exam, resulting in distortion of the mediastinal contours and reduced diagnostic sensitivity and specificity for mediastinal pathology. Atherosclerosis in the thoracic aorta.  IMPRESSION: 1.  Findings suggest mild congestive heart failure, as above. 2.  Atherosclerosis.   Original Report Authenticated By: Trudie Reed, M.D.    Dg Chest Port 1 View  03/14/2012  *RADIOLOGY REPORT*  Clinical Data: CHF  PORTABLE CHEST - 1 VIEW  Comparison: Yesterday  Findings: Endotracheal and NG tubes removed.  Stable central venous catheter.  Cardiomegaly persists.  Bibasilar haziness likely a combination of pleural fluid and volume loss has improved.  No pneumothorax.  IMPRESSION: Extubated.  Improved bibasilar haziness as  described.   Original Report Authenticated By: Jolaine Click, M.D.    Dg Chest Port 1 View  03/13/2012  *RADIOLOGY REPORT*  Clinical Data: 71 year old female respiratory distress.  Shortness of breath and cough.  PORTABLE CHEST - 1 VIEW  Comparison: 03/11/2012 and earlier.  Findings: Portable AP semi upright view 2028 hours.  Right IJ central line tip continues to project at the right atrium.  The patient has been extubated with enteric tube removed.  Lower lung volumes.  Pulmonary vascularity remains increased but appears improved from comparison.  Crowding of markings at both bases.  No pneumothorax or large effusion.  No definite consolidation. Stable cardiomegaly  and mediastinal contours.  IMPRESSION: 1.  Extubated and enteric tube removed. 2.  Lower lung volumes with mildly decreased bibasilar opacity. 3.  Pulmonary edema appears decreased. 4.  Stable right IJ central line allowing for lower lung volumes, tip at the right atrium level.   Original Report Authenticated By: Erskine Speed, M.D.    Dg Chest Port 1 View  03/13/2012  *RADIOLOGY REPORT*  Clinical Data: Check endotracheal tube  PORTABLE CHEST - 1 VIEW  Comparison: 03/11/2012  Findings: Cardiomegaly again noted.  Central vascular congestion. Stable NG tube position.  Stable right IJ central line position with tip in the right atrium.  Endotracheal tube in place with tip 4.7 cm above the carina.  Probable small bilateral pleural effusion with bilateral basilar atelectasis or infiltrate.  IMPRESSION: Stable support apparatus.  Central vascular congestion.  Probable small bilateral pleural effusion with bilateral basilar atelectasis or infiltrate.   Original Report Authenticated By: Natasha Mead, M.D.    Dg Chest Portable 1 View  03/11/2012  *RADIOLOGY REPORT*  Clinical Data: Respiratory distress.  PORTABLE CHEST - 1 VIEW  Comparison: 03/19/2012 and 1924 hours.  Findings: Endotracheal tube with tip about 5 cm above the carina. Enteric tube tip is in the  left upper quadrant, likely in the mid stomach.  Interval placement of a right internal jugular central venous catheter with tip over the upper right atrium.  Cardiac enlargement with pulmonary vascular congestion and diffuse interstitial edema.  No blunting of costophrenic angles.  No pneumothorax.  IMPRESSION: Appliances positioned as described.  Persistent cardiac enlargement with pulmonary vascular congestion and diffuse edema.   Original Report Authenticated By: Burman Nieves, M.D.    Dg Chest Portable 1 View  03/11/2012  *RADIOLOGY REPORT*  Clinical Data: Respiratory distress.  PORTABLE CHEST - 1 VIEW  Comparison: 01/31/2012  Findings: Endotracheal tube is 3 cm above the carina.  NG tube enters the stomach.  Cardiomegaly.  Diffuse bilateral airspace disease, increased since prior study compatible with worsening edema.  No effusions.  No acute bony abnormality.  IMPRESSION: Moderate CHF pattern.   Original Report Authenticated By: Charlett Nose, M.D.      Microbiology: Recent Results (from the past 240 hour(s))  MRSA PCR SCREENING     Status: Normal   Collection Time   04/06/12 11:55 PM      Component Value Range Status Comment   MRSA by PCR NEGATIVE  NEGATIVE Final      Labs: Basic Metabolic Panel:  Lab 04/09/12 1914 04/08/12 0530 04/07/12 0515 04/06/12 1957  NA 141 142 142 137  K 3.3* 3.5 -- --  CL 103 106 105 100  CO2 25 26 26 24   GLUCOSE 207* 284* 233* 314*  BUN 19 19 15 16   CREATININE 2.24* 2.13* 2.14* 2.26*  CALCIUM 11.5* 11.7* 11.4* 11.8*  MG -- -- -- --  PHOS -- -- -- --   Liver Function Tests:  Lab 04/06/12 1957  AST 21  ALT 21  ALKPHOS 183*  BILITOT 0.3  PROT 7.8  ALBUMIN 3.7   No results found for this basename: LIPASE:5,AMYLASE:5 in the last 168 hours No results found for this basename: AMMONIA:5 in the last 168 hours CBC:  Lab 04/07/12 0515 04/06/12 1957  WBC 7.4 9.8  NEUTROABS -- 7.7  HGB 10.6* 11.1*  HCT 32.7* 35.8*  MCV 80.3 80.3  PLT 191 193    Cardiac Enzymes:  Lab 04/07/12 1040 04/07/12 0515 04/06/12 2327 04/06/12 1957  CKTOTAL -- -- -- --  CKMB -- -- -- --  CKMBINDEX -- -- -- --  TROPONINI <0.30 <0.30 <0.30 <0.30   BNP: No components found with this basename: POCBNP:5 CBG: No results found for this basename: GLUCAP:5 in the last 168 hours  Time coordinating discharge:  Greater than 30 minutes  Signed:  Elyse Prevo, DO [facility] Pager: [phone] 04/09/2012, 12:54 PM

## 2012-04-09 NOTE — Progress Notes (Signed)
ANTICOAGULATION CONSULT NOTE   Pharmacy Consult for Coumadin Indication: atrial fibrillation  No Known Allergies  Labs:  Basename 04/09/12 0442 04/08/12 0530 04/07/12 1040 04/07/12 0515 04/06/12 2327 04/06/12 1957  HGB -- -- -- 10.6* -- 11.1*  HCT -- -- -- 32.7* -- 35.8*  PLT -- -- -- 191 -- 193  APTT -- -- -- -- -- --  LABPROT 30.5* 30.6* -- 32.9* -- --  INR 3.13* 3.14* -- 3.47* -- --  HEPARINUNFRC -- -- -- -- -- --  CREATININE 2.24* 2.13* -- 2.14* -- --  CKTOTAL -- -- -- -- -- --  CKMB -- -- -- -- -- --  TROPONINI -- -- <0.30 <0.30 <0.30 --    Estimated Creatinine Clearance: 23.1 ml/min (by C-G formula based on Cr of 2.24).  Assessment: 71 yo female with h/o atrial fibrillation who presented with shortness of breath. Pharmacy consulted to manage Coumadin.   Her home dose is 2.5 mg MWF and 5 mg TTSS. Last dose 11.27.  She also takes amio 200 mg qday at home.   CBC stable.  No bleeding reported.   INR today still slightly elevated at 3.13  Goal of Therapy:  INR 2-3   Plan:  1. No Coumadin today 2. Daily PT / INR  Thank you. Okey Regal, PharmD [phone]  04/09/2012 9:06 AM

## 2012-04-10 ENCOUNTER — Ambulatory Visit: Payer: Self-pay | Admitting: Cardiovascular Disease

## 2012-04-10 DIAGNOSIS — I4891 Unspecified atrial fibrillation: Secondary | ICD-10-CM

## 2012-04-11 LAB — TSH: TSH: 0.195 u[IU]/mL — ABNORMAL LOW (ref 0.350–4.500)

## 2012-04-12 ENCOUNTER — Encounter (HOSPITAL_COMMUNITY): Payer: Medicare Other

## 2012-04-15 ENCOUNTER — Emergency Department (HOSPITAL_COMMUNITY)
Admission: EM | Admit: 2012-04-15 | Discharge: 2012-04-15 | Disposition: A | Discharge status: Home / Self Care | Payer: Medicare Other | Attending: Emergency Medicine | Admitting: Emergency Medicine

## 2012-04-15 ENCOUNTER — Encounter (HOSPITAL_COMMUNITY): Payer: Self-pay | Admitting: *Deleted

## 2012-04-15 ENCOUNTER — Emergency Department (HOSPITAL_COMMUNITY): Payer: Medicare Other

## 2012-04-15 DIAGNOSIS — Z8679 Personal history of other diseases of the circulatory system: Secondary | ICD-10-CM | POA: Insufficient documentation

## 2012-04-15 DIAGNOSIS — I2510 Atherosclerotic heart disease of native coronary artery without angina pectoris: Secondary | ICD-10-CM | POA: Insufficient documentation

## 2012-04-15 DIAGNOSIS — R0600 Dyspnea, unspecified: Secondary | ICD-10-CM

## 2012-04-15 DIAGNOSIS — R0609 Other forms of dyspnea: Secondary | ICD-10-CM | POA: Insufficient documentation

## 2012-04-15 DIAGNOSIS — I4891 Unspecified atrial fibrillation: Secondary | ICD-10-CM | POA: Insufficient documentation

## 2012-04-15 DIAGNOSIS — Z87891 Personal history of nicotine dependence: Secondary | ICD-10-CM | POA: Insufficient documentation

## 2012-04-15 DIAGNOSIS — I509 Heart failure, unspecified: Secondary | ICD-10-CM | POA: Insufficient documentation

## 2012-04-15 DIAGNOSIS — D509 Iron deficiency anemia, unspecified: Secondary | ICD-10-CM | POA: Insufficient documentation

## 2012-04-15 DIAGNOSIS — I129 Hypertensive chronic kidney disease with stage 1 through stage 4 chronic kidney disease, or unspecified chronic kidney disease: Secondary | ICD-10-CM | POA: Insufficient documentation

## 2012-04-15 DIAGNOSIS — I252 Old myocardial infarction: Secondary | ICD-10-CM | POA: Insufficient documentation

## 2012-04-15 DIAGNOSIS — Z79899 Other long term (current) drug therapy: Secondary | ICD-10-CM | POA: Insufficient documentation

## 2012-04-15 DIAGNOSIS — N184 Chronic kidney disease, stage 4 (severe): Secondary | ICD-10-CM | POA: Insufficient documentation

## 2012-04-15 DIAGNOSIS — Z8614 Personal history of Methicillin resistant Staphylococcus aureus infection: Secondary | ICD-10-CM | POA: Insufficient documentation

## 2012-04-15 DIAGNOSIS — Z8742 Personal history of other diseases of the female genital tract: Secondary | ICD-10-CM | POA: Insufficient documentation

## 2012-04-15 DIAGNOSIS — R0989 Other specified symptoms and signs involving the circulatory and respiratory systems: Secondary | ICD-10-CM | POA: Insufficient documentation

## 2012-04-15 LAB — CBC
HCT: 32.4 % — ABNORMAL LOW (ref 36.0–46.0)
Hemoglobin: 10.7 g/dL — ABNORMAL LOW (ref 12.0–15.0)
MCH: 26.8 pg (ref 26.0–34.0)
MCHC: 33 g/dL (ref 30.0–36.0)
MCV: 81.2 fL (ref 78.0–100.0)
Platelets: 217 10*3/uL (ref 150–400)
RBC: 3.99 MIL/uL (ref 3.87–5.11)
RDW: 17 % — ABNORMAL HIGH (ref 11.5–15.5)
WBC: 7.9 10*3/uL (ref 4.0–10.5)

## 2012-04-15 LAB — BASIC METABOLIC PANEL
BUN: 14 mg/dL (ref 6–23)
CO2: 23 mEq/L (ref 19–32)
Calcium: 11.6 mg/dL — ABNORMAL HIGH (ref 8.4–10.5)
Chloride: 103 mEq/L (ref 96–112)
Creatinine, Ser: 1.95 mg/dL — ABNORMAL HIGH (ref 0.50–1.10)
GFR calc Af Amer: 29 mL/min — ABNORMAL LOW (ref >90)
GFR calc non Af Amer: 25 mL/min — ABNORMAL LOW (ref >90)
Glucose, Bld: 214 mg/dL — ABNORMAL HIGH (ref 70–99)
Potassium: 3.1 mEq/L — ABNORMAL LOW (ref 3.5–5.1)
Sodium: 138 mEq/L (ref 135–145)

## 2012-04-15 LAB — TROPONIN I: Troponin I: <0.3 ng/mL (ref <0.30)

## 2012-04-15 LAB — PROTIME-INR
INR: 2.63 — ABNORMAL HIGH (ref 0.00–1.49)
Prothrombin Time: 26.8 seconds — ABNORMAL HIGH (ref 11.6–15.2)

## 2012-04-15 LAB — PRO B NATRIURETIC PEPTIDE: Pro B Natriuretic peptide (BNP): 1866 pg/mL — ABNORMAL HIGH (ref 0–125)

## 2012-04-15 MED ORDER — POTASSIUM CHLORIDE CRYS ER 20 MEQ PO TBCR
60.0000 meq | EXTENDED_RELEASE_TABLET | Freq: Once | ORAL | Status: AC
  Administered 2012-04-15: 60 meq via ORAL
  Filled 2012-04-15: qty 3

## 2012-04-15 NOTE — ED Provider Notes (Signed)
History    71 year old female with shortness of breath. Onset shortly before arrival as she was stepping out of the shower. Currently with no complaints. She denies any chest pain or palpitations then or currently.She has a history of hypertrophic obstructive cardiomyopathy and has been recently hospitalized and intubated. She reports that she is compliant with her medications. No change in her extremity edema. No fevers or chills.  CSN: [id number]  Arrival date & time 04/15/12  Rickey Primus   First MD Initiated Contact with Patient 04/15/12 1844      Chief Complaint  Patient presents with  . Shortness of Breath    (Consider location/radiation/quality/duration/timing/severity/associated sxs/prior treatment) HPI  Past Medical History  Diagnosis Date  . CAD (coronary artery disease)     cath 11/11: LAD 40%, mid-dist 25-30%; prox CFX 30%, mid 40%; OM 50%; PDA 50%; PLV 50%;  EF 65%  . Acute myocardial infarction     in setting of AFib with RVR 03/2010  . Atrial fibrillation     DCCV 11/17/11; coumadin & amiodarone  . HOCM (hypertrophic obstructive cardiomyopathy)     Echo 11/2011:severe LVH, EF 65-70%, mid-cavity gradient up to  . Hypertension   . Tobacco abuse   . Chronic kidney disease     Stage 4  . CHF (congestive heart failure)     preserved LVF  . Iron deficiency anemia   . Third degree uterine prolapse   . Hx MRSA infection     Past Surgical History  Procedure Date  . None   . Cardioversion 11/17/2011    Procedure: CARDIOVERSION;  Surgeon: Hillis Range, MD;  Location: [facility] OR;  Service: Cardiovascular;  Laterality: N/A;    Family History  Problem Relation Age of Onset  . Coronary artery disease Neg Hx   . Atrial fibrillation Neg Hx     History  Substance Use Topics  . Smoking status: Former Smoker -- 0.2 packs/day for 30 years    Types: Cigarettes    Quit date: 02/02/2012  . Smokeless tobacco: Never Used     Comment: quit 01/2012  . Alcohol Use: No    OB  History    Grav Para Term Preterm Abortions TAB SAB Ect Mult Living                  Review of Systems   Review of symptoms negative unless otherwise noted in HPI.   Allergies  Review of patient's allergies indicates no known allergies.  Home Medications   Current Outpatient Rx  Name  Route  Sig  Dispense  Refill  . AMIODARONE HCL 200 MG PO TABS   Oral   Take 200 mg by mouth daily.          Marland Kitchen AMLODIPINE BESYLATE 5 MG PO TABS   Oral   Take 1 tablet (5 mg total) by mouth daily.   30 tablet   11   . FERROUS SULFATE 325 (65 FE) MG PO TABS   Oral   Take 1 tablet (325 mg total) by mouth daily with breakfast.   30 tablet   1   . FUROSEMIDE 40 MG PO TABS   Oral   Take 40 mg by mouth daily.         Marland Kitchen METOPROLOL TARTRATE 25 MG PO TABS   Oral   Take 25 mg by mouth 2 (two) times daily.         Marland Kitchen NITROGLYCERIN 0.4 MG SL SUBL   Sublingual  Place 0.4 mg under the tongue every 5 (five) minutes as needed. For chest pain         . ROSUVASTATIN CALCIUM 10 MG PO TABS   Oral   Take 10 mg by mouth at bedtime.          . WARFARIN SODIUM 5 MG PO TABS   Oral   Take 2.5-5 mg by mouth daily. Alternate daily doses of 2.5 mg and 5 mg.         Marland Kitchen GLIPIZIDE 5 MG PO TABS   Oral   Take 1 tablet (5 mg total) by mouth daily.   30 tablet   0     BP 152/105  Pulse 108  Temp 98.3 F (36.8 C) (Oral)  Resp 20  SpO2 99%  Physical Exam  Nursing note and vitals reviewed. Constitutional: She appears well-developed and well-nourished. No distress.  HENT:  Head: Normocephalic and atraumatic.  Eyes: Conjunctivae normal are normal. Right eye exhibits no discharge. Left eye exhibits no discharge.  Neck: Neck supple.  Cardiovascular: Normal rate, regular rhythm and normal heart sounds.  Exam reveals no gallop and no friction rub.   No murmur heard. Pulmonary/Chest: Effort normal and breath sounds normal. No respiratory distress.       Faint crackles R base. No increased wob.    Abdominal: Soft. She exhibits no distension. There is no tenderness.  Musculoskeletal: She exhibits no edema and no tenderness.       Lower extremities symmetric as compared to each other. No calf tenderness. Negative Homan's. No palpable cords.   Neurological: She is alert.  Skin: Skin is warm and dry. She is not diaphoretic.  Psychiatric: She has a normal mood and affect. Her behavior is normal. Thought content normal.    ED Course  Procedures (including critical care time)  Labs Reviewed  PROTIME-INR - Abnormal; Notable for the following:    Prothrombin Time 26.8 (*)     INR 2.63 (*)     All other components within normal limits  CBC - Abnormal; Notable for the following:    Hemoglobin 10.7 (*)     HCT 32.4 (*)     RDW 17.0 (*)     All other components within normal limits  PRO B NATRIURETIC PEPTIDE - Abnormal; Notable for the following:    Pro B Natriuretic peptide (BNP) 1866.0 (*)     All other components within normal limits  BASIC METABOLIC PANEL - Abnormal; Notable for the following:    Potassium 3.1 (*)     Glucose, Bld 214 (*)     Creatinine, Ser 1.95 (*)     Calcium 11.6 (*)     GFR calc non Af Amer 25 (*)     GFR calc Af Amer 29 (*)     All other components within normal limits  TROPONIN I  LAB REPORT - SCANNED   No results found.  Dg Chest 2 View  04/15/2012  *RADIOLOGY REPORT*  Clinical Data: Dyspnea and shortness of breath.  CHEST - 2 VIEW  Comparison: One-view chest 04/06/2012.  Findings: Cardiac enlargement is stable.  Emphysematous changes are evident.  Chronic interstitial coarsening is stable.  No definite edema or effusions are evident.  Exaggerated kyphosis of the thoracic spine is stable.  Moderate osteopenia is again noted.  IMPRESSION:  1.  Stable cardiomegaly without definite failure. 2.  Emphysema.   Original Report Authenticated By: Marin Roberts, M.D.    EKG:  Rhythm: Normal sinus Vent.  rate 75 BPM PR interval 160 ms QRS duration 168  ms QT/QTc 496/554 ms Axis: Left Left bundle branch block ST segments: Nonspecific ST changes. Comparison: Left bundle branch block noted on previous   1. Dyspnea   2. hypokalemia    MDM  71yF with dyspnea.Hx of hypertrophic obstructive cardiomyopathy with recent intubation. Just recently discharged. Reports compliance with her meds. EKG shows LBBB but noted on previous. Pt denies CP. SOB currently resolved. Good O2 sats on RA. Consider PE but doubt. Doesn't seem particularly anxious. Doubt anginal equivalent. Doubt infectious. HD stable. Feel safe for DC with close outpt cards fu.         Raeford Razor, MD 04/19/12 1432

## 2012-04-15 NOTE — ED Notes (Signed)
Getting out of shower became sob; ra sao2 89%; now: 100 2l [place]. Denies sob. D/c'd last week for chf.  Crackles rt. Lower lobe.

## 2012-04-19 ENCOUNTER — Ambulatory Visit (HOSPITAL_COMMUNITY): Payer: Medicare Other | Attending: Cardiovascular Disease | Admitting: Radiology

## 2012-04-19 ENCOUNTER — Ambulatory Visit (INDEPENDENT_AMBULATORY_CARE_PROVIDER_SITE_OTHER): Payer: Medicare Other | Admitting: *Deleted

## 2012-04-19 VITALS — BP 122/75 | Ht 63.0 in | Wt 180.0 lb

## 2012-04-19 DIAGNOSIS — I4891 Unspecified atrial fibrillation: Secondary | ICD-10-CM | POA: Insufficient documentation

## 2012-04-19 DIAGNOSIS — R000 Tachycardia, unspecified: Secondary | ICD-10-CM | POA: Insufficient documentation

## 2012-04-19 DIAGNOSIS — J810 Acute pulmonary edema: Secondary | ICD-10-CM

## 2012-04-19 DIAGNOSIS — I447 Left bundle-branch block, unspecified: Secondary | ICD-10-CM | POA: Insufficient documentation

## 2012-04-19 DIAGNOSIS — R0989 Other specified symptoms and signs involving the circulatory and respiratory systems: Secondary | ICD-10-CM | POA: Insufficient documentation

## 2012-04-19 DIAGNOSIS — I10 Essential (primary) hypertension: Secondary | ICD-10-CM | POA: Insufficient documentation

## 2012-04-19 DIAGNOSIS — E119 Type 2 diabetes mellitus without complications: Secondary | ICD-10-CM | POA: Insufficient documentation

## 2012-04-19 DIAGNOSIS — R0602 Shortness of breath: Secondary | ICD-10-CM | POA: Insufficient documentation

## 2012-04-19 DIAGNOSIS — I2510 Atherosclerotic heart disease of native coronary artery without angina pectoris: Secondary | ICD-10-CM | POA: Insufficient documentation

## 2012-04-19 DIAGNOSIS — I5032 Chronic diastolic (congestive) heart failure: Secondary | ICD-10-CM

## 2012-04-19 DIAGNOSIS — R0609 Other forms of dyspnea: Secondary | ICD-10-CM | POA: Insufficient documentation

## 2012-04-19 DIAGNOSIS — I509 Heart failure, unspecified: Secondary | ICD-10-CM | POA: Insufficient documentation

## 2012-04-19 DIAGNOSIS — R002 Palpitations: Secondary | ICD-10-CM | POA: Insufficient documentation

## 2012-04-19 MED ORDER — TECHNETIUM TC 99M SESTAMIBI GENERIC - CARDIOLITE
33.0000 | Freq: Once | INTRAVENOUS | Status: AC | PRN
  Administered 2012-04-19: 33 via INTRAVENOUS

## 2012-04-19 MED ORDER — REGADENOSON 0.4 MG/5ML IV SOLN
0.4000 mg | Freq: Once | INTRAVENOUS | Status: AC
  Administered 2012-04-19: 0.4 mg via INTRAVENOUS

## 2012-04-19 MED ORDER — TECHNETIUM TC 99M SESTAMIBI GENERIC - CARDIOLITE
11.0000 | Freq: Once | INTRAVENOUS | Status: AC | PRN
  Administered 2012-04-19: 11 via INTRAVENOUS

## 2012-04-19 NOTE — Progress Notes (Signed)
[facility] SITE 3 NUCLEAR MED [address] [id number] [place] [address] [phone]  Cardiology Nuclear Med Study  [name] is a 71 y.o. female     MRN : [id number]     DOB: Jun 27, 1940  Procedure Date: 04/19/2012  Nuclear Med Background Indication for Stress Test:  Evaluation for Ischemia, Post Hospital and 04/09/12 ED SOB (LBBB) CXR pulmonary congestion (-) enzymes  History:  AFIB, CHF, Cardiomyopathy, 11/11 MI with AFIB-Heart Cath EF: 65% mod N//O CAD  7/13 ECHO: Severe LVH EF: EF 65-70% Cardiac Risk Factors: History of Smoking, Hypertension, NIDDM and transiet LBBB  Symptoms:  DOE, Fatigue, Palpitations, Rapid HR and SOB   Nuclear Pre-Procedure Caffeine/Decaff Intake:  None NPO After: 7:30am   Lungs:  clear O2 Sat: 96% on room air. IV 0.9% NS with Angio Cath:  22g  IV Site: R Wrist  IV Started by:  Milana Na, EMT-P  Chest Size (in):  40 Cup Size: C  Height: 5\' 3"  (1.6 m)  Weight:     BMI:  There is no weight on file to calculate BMI. Tech Comments:  N/A    Nuclear Med Study 1 or 2 day study: 1 day  Stress Test Type:  Lexiscan  Reading MD: Kristeen Miss, MD  Order Authorizing Provider:  Rollene Rotunda, MD  Resting Radionuclide: Technetium 10m Sestamibi  Resting Radionuclide Dose: 11.0 mCi   Stress Radionuclide:  Technetium 47m Sestamibi  Stress Radionuclide Dose: 33.0 mCi           Stress Protocol Rest HR: 58 Stress HR: 87  Rest BP: 122/75 Stress BP: 153/92  Exercise Time (min): n/a METS: n/a          Dose of Adenosine (mg):  n/a Dose of Lexiscan: 0.4 mg  Dose of Atropine (mg): n/a Dose of Dobutamine: n/a mcg/kg/min (at max HR)  Stress Test Technologist: Bonnita Levan, RN  Nuclear Technologist:  Domenic Polite, CNMT     Rest Procedure:  Myocardial perfusion imaging was performed at rest 45 minutes following the intravenous administration of Technetium 56m Tetrofosmin. Rest ECG: NSR-LVH  Stress Procedure:  The patient  received IV Lexiscan 0.4 mg over 15-seconds.  Technetium 57m Tetrofosmin injected at 30-seconds.  Quantitative spect images were obtained after a 45 minute delay. Stress ECG: No significant ST segment change suggestive of ischemia.  QPS Raw Data Images:  Normal; no motion artifact; normal heart/lung ratio. Stress Images:  Normal homogeneous uptake in all areas of the myocardium. Rest Images:  Normal homogeneous uptake in all areas of the myocardium. Subtraction (SDS):  No evidence of ischemia. Transient Ischemic Dilatation (Normal <1.22):  1.17 Lung/Heart Ratio (Normal <0.45):  0.37  Quantitative Gated Spect Images QGS EDV:  91 ml QGS ESV:  40 ml  Impression Exercise Capacity:  Lexiscan with no exercise. BP Response:  Normal blood pressure response. Clinical Symptoms:  No significant symptoms noted. ECG Impression:  No significant ST segment change suggestive of ischemia. Comparison with Prior Nuclear Study: No images to compare  Overall Impression:  Normal stress nuclear study.  No evidence of ischemia.    LV Ejection Fraction: 56%.  LV Wall Motion:  NL LV Function; NL Wall Motion.    Vesta Mixer, Montez Hageman., MD, [facility] 04/19/2012, 4:33 PM Office - [phone] Pager [phone]

## 2012-04-20 ENCOUNTER — Encounter: Payer: Self-pay | Admitting: Physician Assistant

## 2012-04-20 ENCOUNTER — Telehealth: Payer: Self-pay | Admitting: *Deleted

## 2012-04-20 DIAGNOSIS — I10 Essential (primary) hypertension: Secondary | ICD-10-CM

## 2012-04-20 NOTE — Telephone Encounter (Signed)
Message copied by Tarri Fuller on Thu Apr 20, 2012  5:09 PM ------      Message from: [place], [place] T      Created: Thu Apr 20, 2012  2:01 PM       Okey Regal -      Please inform patient stress test normal.      Please arrange 24 hr Ambulatory BP monitor.      Tereso Newcomer, PA-C  2:01 PM 04/20/2012

## 2012-04-20 NOTE — Telephone Encounter (Signed)
Message copied by Tarri Fuller on Thu Apr 20, 2012  3:57 PM ------      Message from: [place], [place] T      Created: Thu Apr 20, 2012  2:01 PM       Okey Regal -      Please inform patient stress test normal.      Please arrange 24 hr Ambulatory BP monitor.      Tereso Newcomer, PA-C  2:01 PM 04/20/2012

## 2012-04-20 NOTE — Telephone Encounter (Signed)
pt notified about stress test results and that scheduling will be calling her to schedule pt for a 24 hour BP monitor, pt verbalized understanding today

## 2012-04-27 ENCOUNTER — Encounter: Payer: Self-pay | Admitting: Cardiology

## 2012-04-27 ENCOUNTER — Ambulatory Visit (INDEPENDENT_AMBULATORY_CARE_PROVIDER_SITE_OTHER): Payer: Medicare Other | Admitting: Cardiology

## 2012-04-27 ENCOUNTER — Ambulatory Visit (INDEPENDENT_AMBULATORY_CARE_PROVIDER_SITE_OTHER): Payer: Medicare Other | Admitting: Pharmacist

## 2012-04-27 VITALS — BP 150/80 | HR 63 | Ht 63.0 in | Wt 184.1 lb

## 2012-04-27 DIAGNOSIS — I10 Essential (primary) hypertension: Secondary | ICD-10-CM

## 2012-04-27 DIAGNOSIS — I4891 Unspecified atrial fibrillation: Secondary | ICD-10-CM

## 2012-04-27 DIAGNOSIS — E785 Hyperlipidemia, unspecified: Secondary | ICD-10-CM

## 2012-04-27 DIAGNOSIS — I2510 Atherosclerotic heart disease of native coronary artery without angina pectoris: Secondary | ICD-10-CM

## 2012-04-27 DIAGNOSIS — I5033 Acute on chronic diastolic (congestive) heart failure: Secondary | ICD-10-CM

## 2012-04-27 DIAGNOSIS — I509 Heart failure, unspecified: Secondary | ICD-10-CM

## 2012-04-27 LAB — BASIC METABOLIC PANEL
BUN: 15 mg/dL (ref 6–23)
CO2: 27 mEq/L (ref 19–32)
Calcium: 11.1 mg/dL — ABNORMAL HIGH (ref 8.4–10.5)
Creatinine, Ser: 2.1 mg/dL — ABNORMAL HIGH (ref 0.4–1.2)
GFR: 29.65 mL/min — ABNORMAL LOW (ref >60.00)
Glucose, Bld: 136 mg/dL — ABNORMAL HIGH (ref 70–99)
Sodium: 140 mEq/L (ref 135–145)

## 2012-04-27 NOTE — Patient Instructions (Addendum)
The current medical regimen is effective;  continue present plan and medications.  Please have blood work today  Follow up with Tereso Newcomer, PA in 2 months.

## 2012-04-27 NOTE — Progress Notes (Signed)
HPI The patient presents for followup of cardiomyopathy with preserved ejection fraction. She was in the hospital recently after acute respiratory failure with pulmonary edema. Post hospital she did have a stress perfusion study which demonstrated no evidence of ischemia. She has a well preserved ejection fraction. She is awaiting a 24-hour blood pressure monitor. I am wondering whether her blood pressures fluctuate at home. I do think there is an element of noncompliance with diet. She mentions wanting fried chicken and potato chips during this interview. She says she is breathing well. She denies any PND or orthopnea. She denies any chest pressure, neck or arm discomfort. She has had no weight gain or edema.  Of note the patient was found to have a hemoglobin A1c of 8.7 in the hospital. The hospital physician started glipizide but she didn't take this medication as she did not understand what the diagnosis. She denies any chest pressure, neck or arm discomfort. She denies any palpitations, presyncope or syncope.  No Known Allergies  Current Outpatient Prescriptions  Medication Sig Dispense Refill  . amiodarone (PACERONE) 200 MG tablet Take 200 mg by mouth daily.       Marland Kitchen amLODipine (NORVASC) 5 MG tablet Take 1 tablet (5 mg total) by mouth daily.  30 tablet  11  . ferrous sulfate 325 (65 FE) MG tablet Take 1 tablet (325 mg total) by mouth daily with breakfast.  30 tablet  1  . furosemide (LASIX) 40 MG tablet Take 40 mg by mouth daily.      Marland Kitchen glipiZIDE (GLUCOTROL) 5 MG tablet Take 1 tablet (5 mg total) by mouth daily.  30 tablet  0  . metoprolol tartrate (LOPRESSOR) 25 MG tablet Take 25 mg by mouth 2 (two) times daily.      . nitroGLYCERIN (NITROSTAT) 0.4 MG SL tablet Place 0.4 mg under the tongue every 5 (five) minutes as needed. For chest pain      . rosuvastatin (CRESTOR) 10 MG tablet Take 10 mg by mouth at bedtime.       Marland Kitchen warfarin (COUMADIN) 5 MG tablet Take 2.5-5 mg by mouth daily.  Alternate daily doses of 2.5 mg and 5 mg.        Past Medical History  Diagnosis Date  . CAD (coronary artery disease)     cath 11/11: LAD 40%, mid-dist 25-30%; prox CFX 30%, mid 40%; OM 50%; PDA 50%; PLV 50%;  EF 65%  . Acute myocardial infarction     in setting of AFib with RVR 03/2010  . Atrial fibrillation     DCCV 11/17/11; coumadin & amiodarone  . HOCM (hypertrophic obstructive cardiomyopathy)     Echo 11/2011:severe LVH, EF 65-70%, mid-cavity gradient up to  . Hypertension   . Tobacco abuse   . Chronic kidney disease     Stage 4  . CHF (congestive heart failure)     preserved LVF  . Iron deficiency anemia   . Third degree uterine prolapse   . Hx MRSA infection   . Hx of cardiovascular stress test     a. Lex MV 12/13:  EF 56%, no ischemia    . Diabetes     Past Surgical History  Procedure Date  . Tubal ligation   . Cardioversion 11/17/2011    Procedure: CARDIOVERSION;  Surgeon: Hillis Range, MD;  Location: [facility] OR;  Service: Cardiovascular;  Laterality: N/A;    ROS:  As stated in the HPI and negative for all other systems.  PHYSICAL EXAM  BP 150/80  Pulse 63  Ht 5\' 3"  (1.6 m)  Wt 184 lb 1.9 oz (83.516 kg)  BMI 32.62 kg/m2 GENERAL:  Well appearing HEENT:  Pupils equal round and reactive, fundi not visualized, oral mucosa unremarkable NECK:  No jugular venous distention, waveform within normal limits, carotid upstroke brisk and symmetric, no bruits, no thyromegaly LYMPHATICS:  No cervical, inguinal adenopathy LUNGS:  Clear to auscultation bilaterally BACK:  No CVA tenderness CHEST:  Unremarkable HEART:  PMI not displaced or sustained,S1 and S2 within normal limits, no S3, no S4, no clicks, no rubs, no murmurs ABD:  Flat, positive bowel sounds normal in frequency in pitch, no bruits, no rebound, no guarding, no midline pulsatile mass, no hepatomegaly, no splenomegaly EXT:  2 plus pulses throughout, no edema, no cyanosis no clubbing SKIN:  No rashes no  nodules NEURO:  Cranial nerves II through XII grossly intact, motor grossly intact throughout PSYCH:  Cognitively intact, oriented to person place and time   ASSESSMENT AND PLAN  CAD -  The patient had a negative stress perfusion study. No further cardiovascular testing is suggested. We will attempt continued risk reduction.  Chronic diastolic heart failure -  I think a big part of this is education. We talked extensively about salt restriction again area I will check a basic metabolic profile as she does have renal insufficiency. For now she will continue the medicines as listed.   HYPERTENSION -  I have ordered a 24-hour blood pressure monitor and I will treat accordingly.  TOBACCO ABUSE -  She will continue to abstain from cigarettes.  FIBRILLATION, ATRIAL -  She remains on anticoagulation and amiodarone.  CKD -  I will check a BMET today.   DIABETES - I suggested that she didn't comply with the glipizide as was prescribed. She should followup with Dr.John to  discuss his new diagnosis.

## 2012-05-16 ENCOUNTER — Telehealth: Payer: Self-pay | Admitting: Internal Medicine

## 2012-05-16 NOTE — Telephone Encounter (Signed)
[name], We can give her one more chance. If she no shows again, I will not see her. Rene Kocher

## 2012-05-16 NOTE — Telephone Encounter (Signed)
Scheduled for Jan 24. Noted in schedule comments not to re-schedule if she no-shows the appt.

## 2012-05-16 NOTE — Telephone Encounter (Signed)
This patient has been in the hospital.  The hospital told her to call us to establish care.  She has no-showed for 2 new patient appointments.  One on Nov 13 with Dr. Jonny Ruiz and one on Nicki Reaper on Nov 26.  Do you want [place] to re-schedule her?

## 2012-06-02 ENCOUNTER — Emergency Department (HOSPITAL_COMMUNITY): Payer: Medicare Other

## 2012-06-02 ENCOUNTER — Inpatient Hospital Stay (HOSPITAL_COMMUNITY)
Admission: EM | Admit: 2012-06-02 | Discharge: 2012-06-05 | DRG: 291 | Disposition: A | Discharge status: Home / Self Care | Payer: Medicare Other | Attending: Internal Medicine | Admitting: Internal Medicine

## 2012-06-02 ENCOUNTER — Encounter (HOSPITAL_COMMUNITY): Payer: Self-pay | Admitting: Internal Medicine

## 2012-06-02 ENCOUNTER — Ambulatory Visit: Payer: Medicare Other | Admitting: Internal Medicine

## 2012-06-02 DIAGNOSIS — I120 Hypertensive chronic kidney disease with stage 5 chronic kidney disease or end stage renal disease: Secondary | ICD-10-CM | POA: Diagnosis present

## 2012-06-02 DIAGNOSIS — I509 Heart failure, unspecified: Secondary | ICD-10-CM

## 2012-06-02 DIAGNOSIS — I5033 Acute on chronic diastolic (congestive) heart failure: Principal | ICD-10-CM | POA: Diagnosis present

## 2012-06-02 DIAGNOSIS — I421 Obstructive hypertrophic cardiomyopathy: Secondary | ICD-10-CM | POA: Diagnosis present

## 2012-06-02 DIAGNOSIS — Z87891 Personal history of nicotine dependence: Secondary | ICD-10-CM

## 2012-06-02 DIAGNOSIS — Z79899 Other long term (current) drug therapy: Secondary | ICD-10-CM

## 2012-06-02 DIAGNOSIS — E872 Acidosis, unspecified: Secondary | ICD-10-CM | POA: Diagnosis present

## 2012-06-02 DIAGNOSIS — I2510 Atherosclerotic heart disease of native coronary artery without angina pectoris: Secondary | ICD-10-CM | POA: Diagnosis present

## 2012-06-02 DIAGNOSIS — J9601 Acute respiratory failure with hypoxia: Secondary | ICD-10-CM | POA: Diagnosis present

## 2012-06-02 DIAGNOSIS — E119 Type 2 diabetes mellitus without complications: Secondary | ICD-10-CM | POA: Diagnosis present

## 2012-06-02 DIAGNOSIS — Z7901 Long term (current) use of anticoagulants: Secondary | ICD-10-CM

## 2012-06-02 DIAGNOSIS — I428 Other cardiomyopathies: Secondary | ICD-10-CM | POA: Diagnosis present

## 2012-06-02 DIAGNOSIS — Z91199 Patient's noncompliance with other medical treatment and regimen due to unspecified reason: Secondary | ICD-10-CM

## 2012-06-02 DIAGNOSIS — I252 Old myocardial infarction: Secondary | ICD-10-CM

## 2012-06-02 DIAGNOSIS — I4891 Unspecified atrial fibrillation: Secondary | ICD-10-CM | POA: Diagnosis present

## 2012-06-02 DIAGNOSIS — J9600 Acute respiratory failure, unspecified whether with hypoxia or hypercapnia: Secondary | ICD-10-CM | POA: Diagnosis present

## 2012-06-02 DIAGNOSIS — N185 Chronic kidney disease, stage 5: Secondary | ICD-10-CM | POA: Diagnosis present

## 2012-06-02 DIAGNOSIS — Z8614 Personal history of Methicillin resistant Staphylococcus aureus infection: Secondary | ICD-10-CM

## 2012-06-02 DIAGNOSIS — I10 Essential (primary) hypertension: Secondary | ICD-10-CM | POA: Diagnosis present

## 2012-06-02 DIAGNOSIS — I5032 Chronic diastolic (congestive) heart failure: Secondary | ICD-10-CM

## 2012-06-02 DIAGNOSIS — D509 Iron deficiency anemia, unspecified: Secondary | ICD-10-CM | POA: Diagnosis present

## 2012-06-02 DIAGNOSIS — D631 Anemia in chronic kidney disease: Secondary | ICD-10-CM | POA: Diagnosis present

## 2012-06-02 DIAGNOSIS — Z9119 Patient's noncompliance with other medical treatment and regimen: Secondary | ICD-10-CM

## 2012-06-02 DIAGNOSIS — J810 Acute pulmonary edema: Secondary | ICD-10-CM

## 2012-06-02 LAB — COMPREHENSIVE METABOLIC PANEL
ALT: 14 U/L (ref 0–35)
Albumin: 3.4 g/dL — ABNORMAL LOW (ref 3.5–5.2)
Alkaline Phosphatase: 177 U/L — ABNORMAL HIGH (ref 39–117)
Calcium: 11.1 mg/dL — ABNORMAL HIGH (ref 8.4–10.5)
GFR calc Af Amer: 30 mL/min — ABNORMAL LOW (ref >90)
Glucose, Bld: 363 mg/dL — ABNORMAL HIGH (ref 70–99)
Potassium: 3.7 mEq/L (ref 3.5–5.1)
Sodium: 135 mEq/L (ref 135–145)
Total Protein: 7.4 g/dL (ref 6.0–8.3)

## 2012-06-02 LAB — TROPONIN I: Troponin I: <0.3 ng/mL (ref <0.30)

## 2012-06-02 LAB — GLUCOSE, CAPILLARY
Glucose-Capillary: 130 mg/dL — ABNORMAL HIGH (ref 70–99)
Glucose-Capillary: 62 mg/dL — ABNORMAL LOW (ref 70–99)
Glucose-Capillary: 66 mg/dL — ABNORMAL LOW (ref 70–99)
Glucose-Capillary: 85 mg/dL (ref 70–99)

## 2012-06-02 LAB — POCT I-STAT 3, ART BLOOD GAS (G3+)
Acid-base deficit: 8 mmol/L — ABNORMAL HIGH (ref 0.0–2.0)
pCO2 arterial: 51.2 mmHg — ABNORMAL HIGH (ref 35.0–45.0)
pH, Arterial: 7.207 — ABNORMAL LOW (ref 7.350–7.450)
pO2, Arterial: 150 mmHg — ABNORMAL HIGH (ref 80.0–100.0)

## 2012-06-02 LAB — POCT I-STAT, CHEM 8
BUN: 14 mg/dL (ref 6–23)
Chloride: 108 mEq/L (ref 96–112)
Creatinine, Ser: 1.9 mg/dL — ABNORMAL HIGH (ref 0.50–1.10)
Hemoglobin: 11.6 g/dL — ABNORMAL LOW (ref 12.0–15.0)
Potassium: 3.7 mEq/L (ref 3.5–5.1)
Sodium: 138 mEq/L (ref 135–145)

## 2012-06-02 LAB — POCT I-STAT TROPONIN I: Troponin i, poc: 0.07 ng/mL (ref 0.00–0.08)

## 2012-06-02 LAB — CBC
HCT: 31.1 % — ABNORMAL LOW (ref 36.0–46.0)
Hemoglobin: 9.8 g/dL — ABNORMAL LOW (ref 12.0–15.0)
MCH: 26.7 pg (ref 26.0–34.0)
MCH: 26.1 pg (ref 26.0–34.0)
MCHC: 31.8 g/dL (ref 30.0–36.0)
MCV: 82.9 fL (ref 78.0–100.0)
Platelets: 322 10*3/uL (ref 150–400)
Platelets: 241 10*3/uL (ref 150–400)
RBC: 3.75 MIL/uL — ABNORMAL LOW (ref 3.87–5.11)
RDW: 17.9 % — ABNORMAL HIGH (ref 11.5–15.5)

## 2012-06-02 LAB — BLOOD GAS, ARTERIAL
TCO2: 23.6 mmol/L (ref 0–100)
pCO2 arterial: 37.8 mmHg (ref 35.0–45.0)
pH, Arterial: 7.391 (ref 7.350–7.450)

## 2012-06-02 LAB — INFLUENZA PANEL BY PCR (TYPE A & B): H1N1 flu by pcr: NOT DETECTED

## 2012-06-02 LAB — BASIC METABOLIC PANEL
CO2: 24 mEq/L (ref 19–32)
Calcium: 11.4 mg/dL — ABNORMAL HIGH (ref 8.4–10.5)
Chloride: 107 mEq/L (ref 96–112)
Glucose, Bld: 119 mg/dL — ABNORMAL HIGH (ref 70–99)
Sodium: 141 mEq/L (ref 135–145)

## 2012-06-02 LAB — LACTIC ACID, PLASMA: Lactic Acid, Venous: 1.7 mmol/L (ref 0.5–2.2)

## 2012-06-02 MED ORDER — MUPIROCIN 2 % EX OINT
1.0000 "application " | TOPICAL_OINTMENT | Freq: Two times a day (BID) | CUTANEOUS | Status: DC
  Administered 2012-06-02 – 2012-06-05 (×7): 1 via NASAL
  Filled 2012-06-02: qty 22

## 2012-06-02 MED ORDER — DEXTROSE 5 % IV SOLN
1.0000 g | INTRAVENOUS | Status: DC
  Administered 2012-06-02 – 2012-06-03 (×2): 1 g via INTRAVENOUS
  Filled 2012-06-02 (×3): qty 1

## 2012-06-02 MED ORDER — ACETAMINOPHEN 650 MG RE SUPP: 650.0000 mg | Freq: Four times a day (QID) | RECTAL | Status: DC | PRN

## 2012-06-02 MED ORDER — ROCURONIUM BROMIDE 50 MG/5ML IV SOLN
INTRAVENOUS | Status: AC
  Filled 2012-06-02: qty 2

## 2012-06-02 MED ORDER — AMLODIPINE BESYLATE 5 MG PO TABS
5.0000 mg | ORAL_TABLET | Freq: Every day | ORAL | Status: DC
  Administered 2012-06-03 – 2012-06-05 (×3): 5 mg via ORAL
  Filled 2012-06-02 (×4): qty 1

## 2012-06-02 MED ORDER — ONDANSETRON HCL 4 MG PO TABS: 4.0000 mg | ORAL_TABLET | Freq: Four times a day (QID) | ORAL | Status: DC | PRN

## 2012-06-02 MED ORDER — FUROSEMIDE 10 MG/ML IJ SOLN
40.0000 mg | Freq: Every day | INTRAMUSCULAR | Status: DC
  Administered 2012-06-02 – 2012-06-04 (×3): 40 mg via INTRAVENOUS
  Filled 2012-06-02 (×3): qty 4

## 2012-06-02 MED ORDER — FUROSEMIDE 10 MG/ML IJ SOLN
40.0000 mg | Freq: Once | INTRAMUSCULAR | Status: AC
  Administered 2012-06-02: 40 mg via INTRAVENOUS
  Filled 2012-06-02: qty 4

## 2012-06-02 MED ORDER — SODIUM CHLORIDE 0.9 % IJ SOLN
3.0000 mL | Freq: Two times a day (BID) | INTRAMUSCULAR | Status: DC
  Administered 2012-06-02 – 2012-06-04 (×3): 3 mL via INTRAVENOUS

## 2012-06-02 MED ORDER — LEVOFLOXACIN IN D5W 750 MG/150ML IV SOLN
750.0000 mg | Freq: Once | INTRAVENOUS | Status: AC
  Administered 2012-06-02: 750 mg via INTRAVENOUS
  Filled 2012-06-02: qty 150

## 2012-06-02 MED ORDER — VANCOMYCIN HCL 10 G IV SOLR
1500.0000 mg | Freq: Once | INTRAVENOUS | Status: AC
  Administered 2012-06-02: 1500 mg via INTRAVENOUS
  Filled 2012-06-02: qty 1500

## 2012-06-02 MED ORDER — ETOMIDATE 2 MG/ML IV SOLN
INTRAVENOUS | Status: AC
  Filled 2012-06-02: qty 20

## 2012-06-02 MED ORDER — VANCOMYCIN HCL IN DEXTROSE 1-5 GM/200ML-% IV SOLN
1000.0000 mg | Freq: Once | INTRAVENOUS | Status: DC
  Filled 2012-06-02: qty 200

## 2012-06-02 MED ORDER — NITROGLYCERIN IN D5W 200-5 MCG/ML-% IV SOLN
5.0000 ug/min | INTRAVENOUS | Status: DC
  Administered 2012-06-02 (×2): 5 ug/min via INTRAVENOUS

## 2012-06-02 MED ORDER — VANCOMYCIN HCL 1000 MG IV SOLR
750.0000 mg | INTRAVENOUS | Status: DC
  Administered 2012-06-03 – 2012-06-04 (×2): 750 mg via INTRAVENOUS
  Filled 2012-06-02 (×2): qty 750

## 2012-06-02 MED ORDER — METOPROLOL TARTRATE 25 MG PO TABS
25.0000 mg | ORAL_TABLET | Freq: Two times a day (BID) | ORAL | Status: DC
  Administered 2012-06-03 – 2012-06-05 (×5): 25 mg via ORAL
  Filled 2012-06-02 (×8): qty 1

## 2012-06-02 MED ORDER — LEVOFLOXACIN IN D5W 500 MG/100ML IV SOLN
500.0000 mg | INTRAVENOUS | Status: DC
  Administered 2012-06-04: 500 mg via INTRAVENOUS
  Filled 2012-06-02 (×2): qty 100

## 2012-06-02 MED ORDER — INSULIN ASPART 100 UNIT/ML ~~LOC~~ SOLN: 0.0000 [IU] | Freq: Three times a day (TID) | SUBCUTANEOUS | Status: DC

## 2012-06-02 MED ORDER — CHLORHEXIDINE GLUCONATE CLOTH 2 % EX PADS
6.0000 | MEDICATED_PAD | Freq: Every day | CUTANEOUS | Status: DC
  Administered 2012-06-03 – 2012-06-05 (×3): 6 via TOPICAL

## 2012-06-02 MED ORDER — ONDANSETRON HCL 4 MG/2ML IJ SOLN: 4.0000 mg | Freq: Four times a day (QID) | INTRAMUSCULAR | Status: DC | PRN

## 2012-06-02 MED ORDER — NITROGLYCERIN IN D5W 200-5 MCG/ML-% IV SOLN
INTRAVENOUS | Status: AC
  Administered 2012-06-02: 5 ug/min via INTRAVENOUS
  Filled 2012-06-02: qty 250

## 2012-06-02 MED ORDER — LIDOCAINE HCL (CARDIAC) 20 MG/ML IV SOLN
INTRAVENOUS | Status: AC
  Filled 2012-06-02: qty 5

## 2012-06-02 MED ORDER — AMIODARONE HCL 200 MG PO TABS
200.0000 mg | ORAL_TABLET | Freq: Every day | ORAL | Status: DC
  Administered 2012-06-02 – 2012-06-05 (×4): 200 mg via ORAL
  Filled 2012-06-02 (×4): qty 1

## 2012-06-02 MED ORDER — WARFARIN SODIUM 2.5 MG PO TABS
2.5000 mg | ORAL_TABLET | Freq: Once | ORAL | Status: AC
  Administered 2012-06-02: 2.5 mg via ORAL
  Filled 2012-06-02: qty 1

## 2012-06-02 MED ORDER — WARFARIN - PHARMACIST DOSING INPATIENT: Freq: Every day | Status: DC

## 2012-06-02 MED ORDER — PIPERACILLIN-TAZOBACTAM 3.375 G IVPB
3.3750 g | Freq: Once | INTRAVENOUS | Status: AC
  Administered 2012-06-02: 3.375 g via INTRAVENOUS
  Filled 2012-06-02: qty 50

## 2012-06-02 MED ORDER — ACETAMINOPHEN 325 MG PO TABS: 650.0000 mg | ORAL_TABLET | Freq: Four times a day (QID) | ORAL | Status: DC | PRN

## 2012-06-02 MED ORDER — ATORVASTATIN CALCIUM 20 MG PO TABS
20.0000 mg | ORAL_TABLET | Freq: Every day | ORAL | Status: DC
  Administered 2012-06-02 – 2012-06-04 (×3): 20 mg via ORAL
  Filled 2012-06-02 (×4): qty 1

## 2012-06-02 MED ORDER — SUCCINYLCHOLINE CHLORIDE 20 MG/ML IJ SOLN
INTRAMUSCULAR | Status: AC
  Filled 2012-06-02: qty 1

## 2012-06-02 MED ORDER — GLIPIZIDE 5 MG PO TABS
5.0000 mg | ORAL_TABLET | Freq: Every day | ORAL | Status: DC
  Administered 2012-06-02: 5 mg via ORAL
  Filled 2012-06-02 (×2): qty 1

## 2012-06-02 MED ORDER — FERROUS SULFATE 325 (65 FE) MG PO TABS
325.0000 mg | ORAL_TABLET | Freq: Every day | ORAL | Status: DC
  Administered 2012-06-02 – 2012-06-05 (×4): 325 mg via ORAL
  Filled 2012-06-02 (×5): qty 1

## 2012-06-02 MED ORDER — SODIUM CHLORIDE 0.9 % IJ SOLN
3.0000 mL | Freq: Two times a day (BID) | INTRAMUSCULAR | Status: DC
  Administered 2012-06-02 – 2012-06-05 (×4): 3 mL via INTRAVENOUS

## 2012-06-02 NOTE — Progress Notes (Signed)
[facility] PROGRESS NOTE  SEVILLE BRICK ZOX:[id number] DOB: 05-26-1940 DOA: 06/02/2012 PCP: No primary provider on file.  Assessment/Plan:  #1. Acute respiratory failure hypoxic - probably from CHF , non compliant to medications. Nitro drip off, on 2 lit [place] oxygen with sats in 100% Lasix 40 mg IV daily. Will Closely follow intake output and daily weights and metabolic panel. Since there was a concern for possible pneumonia we will continue with antibiotics covering for health care associated pneumonia. If repeat chest x-ray shows improvement in her chest infiltrates then may DC antibiotics.  flu panel negative. Cardiac enzymes negative.    #2. Lactic acidosis - resolved. not sure what caused it. Denies any chest pain or abdominal pain nausea vomiting or diarrhea  #3. Atrial fibrillation - rate moderately controlled. Continue with Coumadin as per pharmacy and amiodarone.  #4. Diabetes mellitus type 2 - continue with her medications with sliding-scale coverage.  CBG (last 3)   Basename 06/02/12 0753 06/02/12 0507  GLUCAP 111* 130*     #5. Chronic kidney disease - creatinine appears at baseline. Closely follow intake output.  #6. Hypercalcemia - further workup as outpatient if there is no significant increase. Will get PTH and ionized calcium.  #7. Chronic anemia - probably from kidney disease. Follow CBC.   Code Status: full code Family Communication: none at bedside Disposition Plan: pending.    Consultants: none  Antibiotics:  Vancomycin 06/02/12  cefepime  levaquin from 06/02/12  HPI/Subjective: comfortable  Objective: Filed Vitals:   06/02/12 0500 06/02/12 0700 06/02/12 0906 06/02/12 0910  BP:  118/70    Pulse: 56 57 58   Temp:  98 F (36.7 C)    TempSrc:  Axillary    Resp: 20 18 19    Height:      Weight:      SpO2: 99% 100% 100% 100%    Intake/Output Summary (Last 24 hours) at 06/02/12 0914 Last data filed at 06/02/12 0756  Gross per 24 hour  Intake       3 ml  Output   1450 ml  Net  -1447 ml   Filed Weights   06/02/12 0436 06/02/12 0444  Weight: 83.5 kg (184 lb 1.4 oz) 79.8 kg (175 lb 14.8 oz)    Exam: Alert afebrile comfortable.  Cardiovascular: s1s2 bradycardic. Respiratory: Effort normal and breath sounds normal. No respiratory distress. She has no wheezes. She has no rales.  GI: Soft. Bowel sounds are normal. She exhibits no distension. There is no tenderness. There is no rebound.  Musculoskeletal: She exhibits no edema and no tenderness.     Data Reviewed: Basic Metabolic Panel:  Lab 06/02/12 8119 06/02/12 0153 06/02/12 0147  NA 141 138 135  K 3.6 3.7 3.7  CL 107 108 101  CO2 24 -- 17*  GLUCOSE 119* 362* 363*  BUN 15 14 13   CREATININE 1.94* 1.90* 1.89*  CALCIUM 11.4* -- 11.1*  MG 2.1 -- --  PHOS -- -- --   Liver Function Tests:  Lab 06/02/12 0147  AST 26  ALT 14  ALKPHOS 177*  BILITOT 0.2*  PROT 7.4  ALBUMIN 3.4*   No results found for this basename: LIPASE:5,AMYLASE:5 in the last 168 hours No results found for this basename: AMMONIA:5 in the last 168 hours CBC:  Lab 06/02/12 0550 06/02/12 0153 06/02/12 0147  WBC 11.6* -- 14.0*  NEUTROABS -- -- --  HGB 9.8* 11.6* 10.6*  HCT 31.1* 34.0* 33.3*  MCV 82.9 -- 83.9  PLT 241 --  322   Cardiac Enzymes:  Lab 06/02/12 0550  CKTOTAL --  CKMB --  CKMBINDEX --  TROPONINI <0.30   BNP (last 3 results)  Basename 06/02/12 0148 04/15/12 2000 04/06/12 1957  PROBNP 1173.0* 1866.0* 1727.0*   CBG:  Lab 06/02/12 0753 06/02/12 0507  GLUCAP 111* 130*    Recent Results (from the past 240 hour(s))  MRSA PCR SCREENING     Status: Abnormal   Collection Time   06/02/12  5:37 AM      Component Value Range Status Comment   MRSA by PCR POSITIVE (*) NEGATIVE Final      Studies: Dg Chest Portable 1 View  06/02/2012  *RADIOLOGY REPORT*  Clinical Data: Respiratory distress.  History of smoking.  PORTABLE CHEST - 1 VIEW  Comparison: Chest radiograph performed  04/15/2012  Findings: The lungs are well-aerated.  Diffuse bilateral airspace opacification is noted, particularly on the left side.  This raises suspicion for multifocal pneumonia, though asymmetric pulmonary edema might have a similar appearance.  No significant pleural effusion or pneumothorax is seen; trace fluid is seen tracking along the right minor fissure.  The heart is mildly enlarged.  No acute osseous abnormalities are seen.  IMPRESSION: Diffuse bilateral airspace opacification, particularly on the left side.  This raises suspicion for diffuse multifocal pneumonia, though asymmetric pulmonary edema might have a similar appearance. Mild cardiomegaly.   Original Report Authenticated By: Tonia Ghent, M.D.     Scheduled Meds:   . amiodarone  200 mg Oral Daily  . amLODipine  5 mg Oral Daily  . atorvastatin  20 mg Oral q1800  . ceFEPime (MAXIPIME) IV  1 g Intravenous Q24H  . Chlorhexidine Gluconate Cloth  6 each Topical Q0600  . etomidate      . ferrous sulfate  325 mg Oral Q breakfast  . furosemide  40 mg Intravenous Daily  . glipiZIDE  5 mg Oral Q breakfast  . insulin aspart  0-9 Units Subcutaneous TID WC  . levofloxacin (LEVAQUIN) IV  500 mg Intravenous Q48H  . levofloxacin (LEVAQUIN) IV  750 mg Intravenous Once  . lidocaine (cardiac) 100 mg/19ml      . metoprolol tartrate  25 mg Oral BID  . mupirocin ointment  1 application Nasal BID  . rocuronium      . sodium chloride  3 mL Intravenous Q12H  . sodium chloride  3 mL Intravenous Q12H  . succinylcholine      . vancomycin  750 mg Intravenous Q24H  . warfarin  2.5 mg Oral ONCE-1800  . Warfarin - Pharmacist Dosing Inpatient   Does not apply q1800   Continuous Infusions:   . nitroGLYCERIN 5 mcg/min (06/02/12 1610)    Principal Problem:  *Acute respiratory failure with hypoxia Active Problems:  CHRONIC KIDNEY DISEASE STAGE V  CARDIOMYOPATHY, IDIOPATHIC HYPERTROPHIC  Acute on chronic diastolic congestive heart  failure        [facility]  [facility] Pager [phone]. If 8PM-8AM, please contact night-coverage at [email or web address], password [facility] 06/02/2012, 9:14 AM  LOS: 0 days

## 2012-06-02 NOTE — H&P (Signed)
[name] is an 72 y.o. female.   Patient was seen and examined on June 02, 2012. PCP - Dr. Oliver Barre. Cardiologist - Dr. Antoine Poche. Chief Complaint: Shortness of breath. HPI: 72 year-old female with history of diastolic CHF last EF measured was 65-70%, hypertrophic cardiomyopathy, atrial fibrillation on Coumadin, CAD, chronic kidney disease, diabetes mellitus type 2 was brought to the ER after patient started developing sudden onset of shortness of breath while she was sleeping at her house. Patient's shortness of breath was sudden onset denies any associated chest pain fever chills cough or productive sputum. In the ER patient was initially very short of breath and was placed on BiPAP. It nitroglycerin was started and Lasix one dose was given. Patient also start empiric antibiotics has chest x-ray was showing possibility of pneumonia. Patient's condition improved with BiPAP. At this time patient will be admitted to step down unit for close observation. ER patient did discuss with critical care. Patient states she has been taking her medications as advised did not miss. Denies any nausea vomiting abdominal pain.  Past Medical History  Diagnosis Date  . CAD (coronary artery disease)     cath 11/11: LAD 40%, mid-dist 25-30%; prox CFX 30%, mid 40%; OM 50%; PDA 50%; PLV 50%;  EF 65%  . Acute myocardial infarction     in setting of AFib with RVR 03/2010  . Atrial fibrillation     DCCV 11/17/11; coumadin & amiodarone  . HOCM (hypertrophic obstructive cardiomyopathy)     Echo 11/2011:severe LVH, EF 65-70%, mid-cavity gradient up to  . Hypertension   . Tobacco abuse   . Chronic kidney disease     Stage 4  . CHF (congestive heart failure)     preserved LVF  . Iron deficiency anemia   . Third degree uterine prolapse   . Hx MRSA infection   . Hx of cardiovascular stress test     a. Lex MV 12/13:  EF 56%, no ischemia    . Diabetes     Past Surgical History  Procedure Date  . Tubal  ligation   . Cardioversion 11/17/2011    Procedure: CARDIOVERSION;  Surgeon: Hillis Range, MD;  Location: [facility] OR;  Service: Cardiovascular;  Laterality: N/A;    Family History  Problem Relation Age of Onset  . Coronary artery disease Neg Hx   . Atrial fibrillation Neg Hx    Social History:  reports that she quit smoking about 3 months ago. Her smoking use included Cigarettes. She has a 7.5 pack-year smoking history. She has never used smokeless tobacco. She reports that she does not drink alcohol or use illicit drugs.  Allergies: No Known Allergies   (Not in a hospital admission)  Results for orders placed during the hospital encounter of 06/02/12 (from the past 48 hour(s))  CBC     Status: Abnormal   Collection Time   06/02/12  1:47 AM      Component Value Range Comment   WBC 14.0 (*) 4.0 - 10.5 K/uL    RBC 3.97  3.87 - 5.11 MIL/uL    Hemoglobin 10.6 (*) 12.0 - 15.0 g/dL    HCT 16.1 (*) 09.6 - 46.0 %    MCV 83.9  78.0 - 100.0 fL    MCH 26.7  26.0 - 34.0 pg    MCHC 31.8  30.0 - 36.0 g/dL    RDW 04.5 (*) 40.9 - 15.5 %    Platelets 322  150 - 400 K/uL  COMPREHENSIVE METABOLIC PANEL     Status: Abnormal   Collection Time   06/02/12  1:47 AM      Component Value Range Comment   Sodium 135  135 - 145 mEq/L    Potassium 3.7  3.5 - 5.1 mEq/L    Chloride 101  96 - 112 mEq/L    CO2 17 (*) 19 - 32 mEq/L    Glucose, Bld 363 (*) 70 - 99 mg/dL    BUN 13  6 - 23 mg/dL    Creatinine, Ser 1.61 (*) 0.50 - 1.10 mg/dL    Calcium 09.6 (*) 8.4 - 10.5 mg/dL    Total Protein 7.4  6.0 - 8.3 g/dL    Albumin 3.4 (*) 3.5 - 5.2 g/dL    AST 26  0 - 37 U/L    ALT 14  0 - 35 U/L    Alkaline Phosphatase 177 (*) 39 - 117 U/L    Total Bilirubin 0.2 (*) 0.3 - 1.2 mg/dL    GFR calc non Af Amer 26 (*) >90 mL/min    GFR calc Af Amer 30 (*) >90 mL/min   PROTIME-INR     Status: Abnormal   Collection Time   06/02/12  1:47 AM      Component Value Range Comment   Prothrombin Time 26.1 (*) 11.6 - 15.2 seconds     INR 2.54 (*) 0.00 - 1.49   PRO B NATRIURETIC PEPTIDE     Status: Abnormal   Collection Time   06/02/12  1:48 AM      Component Value Range Comment   Pro B Natriuretic peptide (BNP) 1173.0 (*) 0 - 125 pg/mL   POCT I-STAT TROPONIN I     Status: Normal   Collection Time   06/02/12  1:52 AM      Component Value Range Comment   Troponin i, poc 0.07  0.00 - 0.08 ng/mL    Comment 3            POCT I-STAT, CHEM 8     Status: Abnormal   Collection Time   06/02/12  1:53 AM      Component Value Range Comment   Sodium 138  135 - 145 mEq/L    Potassium 3.7  3.5 - 5.1 mEq/L    Chloride 108  96 - 112 mEq/L    BUN 14  6 - 23 mg/dL    Creatinine, Ser 0.45 (*) 0.50 - 1.10 mg/dL    Glucose, Bld 409 (*) 70 - 99 mg/dL    Calcium, Ion 8.11 (*) 1.13 - 1.30 mmol/L    TCO2 21  0 - 100 mmol/L    Hemoglobin 11.6 (*) 12.0 - 15.0 g/dL    HCT 91.4 (*) 78.2 - 46.0 %   CG4 I-STAT (LACTIC ACID)     Status: Abnormal   Collection Time   06/02/12  1:53 AM      Component Value Range Comment   Lactic Acid, Venous 4.56 (*) 0.5 - 2.2 mmol/L   POCT I-STAT 3, BLOOD GAS (G3+)     Status: Abnormal   Collection Time   06/02/12  2:11 AM      Component Value Range Comment   pH, Arterial 7.207 (*) 7.350 - 7.450    pCO2 arterial 51.2 (*) 35.0 - 45.0 mmHg    pO2, Arterial 150.0 (*) 80.0 - 100.0 mmHg    Bicarbonate 20.3  20.0 - 24.0 mEq/L    TCO2 22  0 -  100 mmol/L    O2 Saturation 99.0      Acid-base deficit 8.0 (*) 0.0 - 2.0 mmol/L    Patient temperature 98.7 F      Collection site RADIAL, ALLEN'S TEST ACCEPTABLE      Drawn by RT      Sample type ARTERIAL      Dg Chest Portable 1 View  06/02/2012  *RADIOLOGY REPORT*  Clinical Data: Respiratory distress.  History of smoking.  PORTABLE CHEST - 1 VIEW  Comparison: Chest radiograph performed 04/15/2012  Findings: The lungs are well-aerated.  Diffuse bilateral airspace opacification is noted, particularly on the left side.  This raises suspicion for multifocal pneumonia,  though asymmetric pulmonary edema might have a similar appearance.  No significant pleural effusion or pneumothorax is seen; trace fluid is seen tracking along the right minor fissure.  The heart is mildly enlarged.  No acute osseous abnormalities are seen.  IMPRESSION: Diffuse bilateral airspace opacification, particularly on the left side.  This raises suspicion for diffuse multifocal pneumonia, though asymmetric pulmonary edema might have a similar appearance. Mild cardiomegaly.   Original Report Authenticated By: Tonia Ghent, M.D.     Review of Systems  Constitutional: Negative.   HENT: Negative.   Eyes: Negative.   Respiratory: Positive for shortness of breath.   Cardiovascular: Negative.   Gastrointestinal: Negative.   Genitourinary: Negative.   Musculoskeletal: Negative.   Skin: Negative.   Neurological: Negative.   Endo/Heme/Allergies: Negative.   Psychiatric/Behavioral: Negative.     Blood pressure 128/79, pulse 64, temperature 97.9 F (36.6 C), temperature source Oral, resp. rate 26, SpO2 96.00%. Physical Exam  Constitutional: She is oriented to person, place, and time. She appears well-developed and well-nourished. No distress.  HENT:  Head: Normocephalic and atraumatic.  Right Ear: External ear normal.  Left Ear: External ear normal.  Nose: Nose normal.  Mouth/Throat: Oropharynx is clear and moist. No oropharyngeal exudate.  Eyes: Conjunctivae normal are normal. Pupils are equal, round, and reactive to light. Right eye exhibits no discharge. Left eye exhibits no discharge. No scleral icterus.  Neck: Normal range of motion. Neck supple.  Cardiovascular:       Tachycardia.  Respiratory: Effort normal and breath sounds normal. No respiratory distress. She has no wheezes. She has no rales.       On Bipap.  GI: Soft. Bowel sounds are normal. She exhibits no distension. There is no tenderness. There is no rebound.  Musculoskeletal: She exhibits no edema and no tenderness.    Neurological: She is alert and oriented to person, place, and time.       Moves all extremities.  Skin: Skin is warm and dry. She is not diaphoretic.     Assessment/Plan #1. Acute respiratory failure hypoxic -  probably from CHF - continue with IV nitroglycerin and BiPAP for now. Lasix 40 mg IV daily. Closely follow intake output and daily weights and metabolic panel. Since there was a concern for possible pneumonia we will continue with antibiotics covering for health care associated pneumonia. If repeat chest x-ray shows improvement in her chest infiltrates then may DC antibiotics. Check for flu panel. Cycle cardiac markers. #2. Lactic acidosis - not sure what is causing this. Denies any chest pain or abdominal pain nausea vomiting or diarrhea. Cycle cardiac markers at this time. Recheck lactic acid in a.m. along with ABG. #3. Atrial fibrillation - rate moderately controlled. Continue with Coumadin as per pharmacy and amiodarone. #4. Diabetes mellitus type 2 - continue with her  medications with sliding-scale coverage. #5. Chronic kidney disease - creatinine appears at baseline. Closely follow intake output. #6. Hypercalcemia - further workup as outpatient if there is no significant increase. #7. Chronic anemia - probably from kidney disease. Follow CBC.  CODE STATUS - full code.  Basel Defalco N. 06/02/2012, 4:07 AM

## 2012-06-02 NOTE — Progress Notes (Signed)
ANTICOAGULATION CONSULT NOTE - Initial Consult  Pharmacy Consult for Coumadin and Vancomycin/Cefepime/Levaquin Indication: atrial fibrillation and PNA  No Known Allergies  Patient Measurements: Height: 5' 2.99" (160 cm) Weight: 184 lb 1.4 oz (83.5 kg) IBW/kg (Calculated) : 52.38   Vital Signs: Temp: 97.9 F (36.6 C) (01/24 0340) Temp src: Oral (01/24 0340) BP: 128/79 mmHg (01/24 0345) Pulse Rate: 65  (01/24 0436)  Labs:  Basename 06/02/12 0153 06/02/12 0147  HGB 11.6* 10.6*  HCT 34.0* 33.3*  PLT -- 322  APTT -- --  LABPROT -- 26.1*  INR -- 2.54*  HEPARINUNFRC -- --  CREATININE 1.90* 1.89*  CKTOTAL -- --  CKMB -- --  TROPONINI -- --    Estimated Creatinine Clearance: 27.8 ml/min (by C-G formula based on Cr of 1.9).   Medical History: Past Medical History  Diagnosis Date  . CAD (coronary artery disease)     cath 11/11: LAD 40%, mid-dist 25-30%; prox CFX 30%, mid 40%; OM 50%; PDA 50%; PLV 50%;  EF 65%  . Acute myocardial infarction     in setting of AFib with RVR 03/2010  . Atrial fibrillation     DCCV 11/17/11; coumadin & amiodarone  . HOCM (hypertrophic obstructive cardiomyopathy)     Echo 11/2011:severe LVH, EF 65-70%, mid-cavity gradient up to  . Hypertension   . Tobacco abuse   . Chronic kidney disease     Stage 4  . CHF (congestive heart failure)     preserved LVF  . Iron deficiency anemia   . Third degree uterine prolapse   . Hx MRSA infection   . Hx of cardiovascular stress test     a. Lex MV 12/13:  EF 56%, no ischemia    . Diabetes     Medications:  Prescriptions prior to admission  Medication Sig Dispense Refill  . amiodarone (PACERONE) 200 MG tablet Take 200 mg by mouth daily.       Marland Kitchen amLODipine (NORVASC) 5 MG tablet Take 1 tablet (5 mg total) by mouth daily.  30 tablet  11  . ferrous sulfate 325 (65 FE) MG tablet Take 1 tablet (325 mg total) by mouth daily with breakfast.  30 tablet  1  . furosemide (LASIX) 40 MG tablet Take 40 mg by  mouth daily.      Marland Kitchen glipiZIDE (GLUCOTROL) 5 MG tablet Take 1 tablet (5 mg total) by mouth daily.  30 tablet  0  . metoprolol tartrate (LOPRESSOR) 25 MG tablet Take 25 mg by mouth 2 (two) times daily.      . nitroGLYCERIN (NITROSTAT) 0.4 MG SL tablet Place 0.4 mg under the tongue every 5 (five) minutes as needed. For chest pain      . rosuvastatin (CRESTOR) 10 MG tablet Take 10 mg by mouth at bedtime.       Marland Kitchen warfarin (COUMADIN) 5 MG tablet Take 2.5-5 mg by mouth daily. Alternate daily doses of 2.5 mg and 5 mg.        Assessment: 72 yo female admitted with SOB, possible PNA, h/o AFIb for empiric antibiotics and continued anticcoagulation  Goal of Therapy:  Vancomycin trough 15-20 INR 2-3 Monitor platelets by anticoagulation protocol: Yes   Plan:  Vancomycin 1500 mg IV now, then vancomycin 750 mg IV q24h Cefepime 1 g IV q24h Levaquin 750 mg IV now, then 500 mg IV q48h Coumadin 2.5 mg tonight  Eddie Candle 06/02/2012,4:59 AM

## 2012-06-02 NOTE — Progress Notes (Signed)
NTG d/c dt BP and HR

## 2012-06-02 NOTE — ED Notes (Signed)
RT at bedside. Patient placed on Bi-pap. Patient able to communicate a few words at a time. States she has been on a ventilator before.

## 2012-06-02 NOTE — Progress Notes (Signed)
Utilization review completed.  

## 2012-06-02 NOTE — Progress Notes (Signed)
Pt admitted into room 3310 with belongings via stretcher on bipap, ntg gtt, and zosyn infusing. VSS. No complaints of pain. Will continue to monitor.

## 2012-06-02 NOTE — ED Provider Notes (Signed)
History     CSN: [id number]  Arrival date & time 06/02/12  0124   First MD Initiated Contact with Patient 06/02/12 0135      No chief complaint on file.   (Consider location/radiation/quality/duration/timing/severity/associated sxs/prior treatment) HPI Hx per PT and EMS, called out for resp distress, sats 80s on arrival with h/o CHF and COPD, given breathing TX and started on CPAP. No sig change in route.  On arrival denies any CP< wishes to be intubated if she needs to and has h/o same. Limited history given resp distress, able to shake her head yes and no. Has h/o HOCM/ MI/ previous LBBB.  Symptoms severe Past Medical History  Diagnosis Date  . CAD (coronary artery disease)     cath 11/11: LAD 40%, mid-dist 25-30%; prox CFX 30%, mid 40%; OM 50%; PDA 50%; PLV 50%;  EF 65%  . Acute myocardial infarction     in setting of AFib with RVR 03/2010  . Atrial fibrillation     DCCV 11/17/11; coumadin & amiodarone  . HOCM (hypertrophic obstructive cardiomyopathy)     Echo 11/2011:severe LVH, EF 65-70%, mid-cavity gradient up to  . Hypertension   . Tobacco abuse   . Chronic kidney disease     Stage 4  . CHF (congestive heart failure)     preserved LVF  . Iron deficiency anemia   . Third degree uterine prolapse   . Hx MRSA infection   . Hx of cardiovascular stress test     a. Lex MV 12/13:  EF 56%, no ischemia    . Diabetes     Past Surgical History  Procedure Date  . Tubal ligation   . Cardioversion 11/17/2011    Procedure: CARDIOVERSION;  Surgeon: Hillis Range, MD;  Location: [facility] OR;  Service: Cardiovascular;  Laterality: N/A;    Family History  Problem Relation Age of Onset  . Coronary artery disease Neg Hx   . Atrial fibrillation Neg Hx     History  Substance Use Topics  . Smoking status: Former Smoker -- 0.2 packs/day for 30 years    Types: Cigarettes    Quit date: 02/02/2012  . Smokeless tobacco: Never Used     Comment: quit 01/2012  . Alcohol Use: No    OB  History    Grav Para Term Preterm Abortions TAB SAB Ect Mult Living                  Review of Systems  Constitutional: Negative for fever and chills.  HENT: Negative for neck pain and neck stiffness.   Eyes: Negative for pain.  Respiratory: Positive for shortness of breath. Negative for cough.   Cardiovascular: Negative for chest pain and leg swelling.  Gastrointestinal: Negative for nausea, vomiting and abdominal pain.  Genitourinary: Negative for dysuria.  Musculoskeletal: Negative for back pain.  Skin: Negative for rash.  Neurological: Negative for headaches.  All other systems reviewed and are negative.    Allergies  Review of patient's allergies indicates no known allergies.  Home Medications   Current Outpatient Rx  Name  Route  Sig  Dispense  Refill  . AMIODARONE HCL 200 MG PO TABS   Oral   Take 200 mg by mouth daily.          Marland Kitchen AMLODIPINE BESYLATE 5 MG PO TABS   Oral   Take 1 tablet (5 mg total) by mouth daily.   30 tablet   11   . FERROUS SULFATE 325 (  65 FE) MG PO TABS   Oral   Take 1 tablet (325 mg total) by mouth daily with breakfast.   30 tablet   1   . FUROSEMIDE 40 MG PO TABS   Oral   Take 40 mg by mouth daily.         Marland Kitchen GLIPIZIDE 5 MG PO TABS   Oral   Take 1 tablet (5 mg total) by mouth daily.   30 tablet   0   . METOPROLOL TARTRATE 25 MG PO TABS   Oral   Take 25 mg by mouth 2 (two) times daily.         Marland Kitchen NITROGLYCERIN 0.4 MG SL SUBL   Sublingual   Place 0.4 mg under the tongue every 5 (five) minutes as needed. For chest pain         . ROSUVASTATIN CALCIUM 10 MG PO TABS   Oral   Take 10 mg by mouth at bedtime.          . WARFARIN SODIUM 5 MG PO TABS   Oral   Take 2.5-5 mg by mouth daily. Alternate daily doses of 2.5 mg and 5 mg.           There were no vitals taken for this visit.  Physical Exam  Constitutional: She is oriented to person, place, and time. She appears well-developed and well-nourished.  HENT:    Head: Normocephalic and atraumatic.  Eyes: EOM are normal. Pupils are equal, round, and reactive to light.  Neck: Neck supple.  Cardiovascular: Regular rhythm and intact distal pulses.        tachycardia  Pulmonary/Chest: Effort normal. No stridor. No respiratory distress.       Sig dec bilateral breath sounds, Mod resp distress, no rales or wheezes appreciated  Abdominal: Soft. Bowel sounds are normal. She exhibits no distension. There is no tenderness.  Musculoskeletal: Normal range of motion. She exhibits no edema.  Neurological: She is alert and oriented to person, place, and time.  Skin: Skin is warm and dry.    ED Course  Angiocath insertion Performed by: Sunnie Nielsen Authorized by: Sunnie Nielsen Consent: The procedure was performed in an emergent situation. Risks and benefits: risks, benefits and alternatives were discussed Consent given by: patient Patient understanding: patient states understanding of the procedure being performed Patient consent: the patient's understanding of the procedure matches consent given Procedure consent: procedure consent matches procedure scheduled Required items: required blood products, implants, devices, and special equipment available Patient identity confirmed: verbally with patient Time out: Immediately prior to procedure a "time out" was called to verify the correct patient, procedure, equipment, support staff and site/side marked as required. Preparation: Patient was prepped and draped in the usual sterile fashion. Comments: R sided EJ placed 20G with good saline flow and blood drawn back and sent to lab   (including critical care time)  Results for orders placed during the hospital encounter of 06/02/12  CBC      Component Value Range   WBC 14.0 (*) 4.0 - 10.5 K/uL   RBC 3.97  3.87 - 5.11 MIL/uL   Hemoglobin 10.6 (*) 12.0 - 15.0 g/dL   HCT 16.1 (*) 09.6 - 04.5 %   MCV 83.9  78.0 - 100.0 fL   MCH 26.7  26.0 - 34.0 pg   MCHC 31.8   30.0 - 36.0 g/dL   RDW 40.9 (*) 81.1 - 91.4 %   Platelets 322  150 - 400 K/uL  COMPREHENSIVE  METABOLIC PANEL      Component Value Range   Sodium 135  135 - 145 mEq/L   Potassium 3.7  3.5 - 5.1 mEq/L   Chloride 101  96 - 112 mEq/L   CO2 17 (*) 19 - 32 mEq/L   Glucose, Bld 363 (*) 70 - 99 mg/dL   BUN 13  6 - 23 mg/dL   Creatinine, Ser 1.61 (*) 0.50 - 1.10 mg/dL   Calcium 09.6 (*) 8.4 - 10.5 mg/dL   Total Protein 7.4  6.0 - 8.3 g/dL   Albumin 3.4 (*) 3.5 - 5.2 g/dL   AST 26  0 - 37 U/L   ALT 14  0 - 35 U/L   Alkaline Phosphatase 177 (*) 39 - 117 U/L   Total Bilirubin 0.2 (*) 0.3 - 1.2 mg/dL   GFR calc non Af Amer 26 (*) >90 mL/min   GFR calc Af Amer 30 (*) >90 mL/min  PRO B NATRIURETIC PEPTIDE      Component Value Range   Pro B Natriuretic peptide (BNP) 1173.0 (*) 0 - 125 pg/mL  PROTIME-INR      Component Value Range   Prothrombin Time 26.1 (*) 11.6 - 15.2 seconds   INR 2.54 (*) 0.00 - 1.49  POCT I-STAT, CHEM 8      Component Value Range   Sodium 138  135 - 145 mEq/L   Potassium 3.7  3.5 - 5.1 mEq/L   Chloride 108  96 - 112 mEq/L   BUN 14  6 - 23 mg/dL   Creatinine, Ser 0.45 (*) 0.50 - 1.10 mg/dL   Glucose, Bld 409 (*) 70 - 99 mg/dL   Calcium, Ion 8.11 (*) 1.13 - 1.30 mmol/L   TCO2 21  0 - 100 mmol/L   Hemoglobin 11.6 (*) 12.0 - 15.0 g/dL   HCT 91.4 (*) 78.2 - 95.6 %  CG4 I-STAT (LACTIC ACID)      Component Value Range   Lactic Acid, Venous 4.56 (*) 0.5 - 2.2 mmol/L  POCT I-STAT TROPONIN I      Component Value Range   Troponin i, poc 0.07  0.00 - 0.08 ng/mL   Comment 3           POCT I-STAT 3, BLOOD GAS (G3+)      Component Value Range   pH, Arterial 7.207 (*) 7.350 - 7.450   pCO2 arterial 51.2 (*) 35.0 - 45.0 mmHg   pO2, Arterial 150.0 (*) 80.0 - 100.0 mmHg   Bicarbonate 20.3  20.0 - 24.0 mEq/L   TCO2 22  0 - 100 mmol/L   O2 Saturation 99.0     Acid-base deficit 8.0 (*) 0.0 - 2.0 mmol/L   Patient temperature 98.7 F     Collection site RADIAL, ALLEN'S TEST  ACCEPTABLE     Drawn by RT     Sample type ARTERIAL     Dg Chest Portable 1 View  06/02/2012  *RADIOLOGY REPORT*  Clinical Data: Respiratory distress.  History of smoking.  PORTABLE CHEST - 1 VIEW  Comparison: Chest radiograph performed 04/15/2012  Findings: The lungs are well-aerated.  Diffuse bilateral airspace opacification is noted, particularly on the left side.  This raises suspicion for multifocal pneumonia, though asymmetric pulmonary edema might have a similar appearance.  No significant pleural effusion or pneumothorax is seen; trace fluid is seen tracking along the right minor fissure.  The heart is mildly enlarged.  No acute osseous abnormalities are seen.  IMPRESSION: Diffuse bilateral airspace opacification,  particularly on the left side.  This raises suspicion for diffuse multifocal pneumonia, though asymmetric pulmonary edema might have a similar appearance. Mild cardiomegaly.   Original Report Authenticated By: Tonia Ghent, M.D.      Date: 06/02/2012  Rate: 108  Rhythm: sinus tachycardia  QRS Axis: normal  Intervals: wide QRS  ST/T Wave abnormalities: nonspecific ST/T changes  Conduction Disutrbances:left bundle branch block  Narrative Interpretation: wide complex sinus tach 108, previous ECG 04-19-12 shows NSR QRS 178.   Old EKG Reviewed: changes noted   CRITICAL CARE Performed by: Sunnie Nielsen   Total critical care time: 30  Critical care time was exclusive of separately billable procedures and treating other patients.  Critical care was necessary to treat or prevent imminent or life-threatening deterioration.  Critical care was time spent personally by me on the following activities: development of treatment plan with patient and/or surrogate as well as nursing, discussions with consultants, evaluation of patient's response to treatment, examination of patient, obtaining history from patient or surrogate, ordering and performing treatments and interventions, ordering  and review of laboratory studies, ordering and review of radiographic studies, pulse oximetry and re-evaluation of patient's condition. Cont BiPAP, placed on NTG drip, old records reviewed. Labs and imaging obtained.    2:52 AM d/w PCCM, recs [facility] admit step down ICU. D/w Dr Toniann Fail - will admit. IV Lasix started  MDM  Resp distress and h/o CHF and intubation in the past. Sig improvement with BiPAP and NTG. PCCM consult and MED admit. CXR, ECG and labs reviewed. Serial evaluations and improving condition        Sunnie Nielsen, MD 06/02/12 [phone]

## 2012-06-03 ENCOUNTER — Inpatient Hospital Stay (HOSPITAL_COMMUNITY): Payer: Medicare Other

## 2012-06-03 DIAGNOSIS — E119 Type 2 diabetes mellitus without complications: Secondary | ICD-10-CM | POA: Diagnosis present

## 2012-06-03 LAB — CBC
Hemoglobin: 9.4 g/dL — ABNORMAL LOW (ref 12.0–15.0)
MCH: 26.3 pg (ref 26.0–34.0)
RBC: 3.57 MIL/uL — ABNORMAL LOW (ref 3.87–5.11)
WBC: 9.2 10*3/uL (ref 4.0–10.5)

## 2012-06-03 LAB — GLUCOSE, CAPILLARY
Glucose-Capillary: 91 mg/dL (ref 70–99)
Glucose-Capillary: 130 mg/dL — ABNORMAL HIGH (ref 70–99)

## 2012-06-03 LAB — BASIC METABOLIC PANEL
CO2: 22 mEq/L (ref 19–32)
Chloride: 107 mEq/L (ref 96–112)
Glucose, Bld: 91 mg/dL (ref 70–99)
Potassium: 3.8 mEq/L (ref 3.5–5.1)
Sodium: 141 mEq/L (ref 135–145)

## 2012-06-03 MED ORDER — WARFARIN SODIUM 5 MG PO TABS
5.0000 mg | ORAL_TABLET | Freq: Once | ORAL | Status: AC
  Administered 2012-06-03: 5 mg via ORAL
  Filled 2012-06-03: qty 1

## 2012-06-03 NOTE — Progress Notes (Signed)
[facility] PROGRESS NOTE  [name]:[id number] DOB: 24-Jun-1940 DOA: 06/02/2012 PCP: No primary provider on file.  Assessment/Plan:  #1. Acute respiratory failure hypoxic - probably from CHF , non compliant to medications. Nitro drip off, on RA with sats in 100%, She was started on IV lasix daily, . I/O last 3 completed shifts: In: 6 [I.V.:6] Out: 4050 [Urine:4050] Total I/O In: -  Out: 250 [Urine:250]   She diuresed about 4 liters from admission.  We will change to po lasix and transfer her to telemetry for further management.   Will Closely follow intake output and daily weights and repeat metabolic panel in am. Since there was a concern for possible pneumonia we will continue with antibiotics covering for health care associated pneumonia. If repeat chest x-ray shows improvement in her chest infiltrates then may DC antibiotics.  flu panel negative. Cardiac enzymes negative.    #2. Lactic acidosis - resolved. not sure what caused it. Denies any chest pain or abdominal pain nausea vomiting or diarrhea  #3. Atrial fibrillation - rate moderately controlled. Continue with Coumadin as per pharmacy and amiodarone. INR therapeutic. #4. Diabetes mellitus type 2 - continue with her medications with sliding-scale coverage.  CBG (last 3)   Basename 06/03/12 0751 06/02/12 2141 06/02/12 1646  GLUCAP 91 85 66*   Her hgba1c is 8.7  #5. Chronic kidney disease - creatinine slightly elevated from IV lasix. Will change to po lasix today. Closely follow intake output.  #6. Hypercalcemia - further workup as outpatient if there is no significant increase. Will get PTH and ionized calcium.  She had previous episodes of hypercalcemia which were not followed up .  #7. Chronic anemia - probably from kidney disease. Follow CBC. #8 Iron deficiency anemia -Iron saturation 19%--start patient on ferrous sulfate    Code Status: full code Family Communication: none at bedside Disposition Plan:  transfer to telemetry.    Consultants: none  Antibiotics:  Vancomycin 06/02/12  cefepime  levaquin from 06/02/12  HPI/Subjective: comfortable  Objective: Filed Vitals:   06/02/12 2336 06/03/12 0000 06/03/12 0339 06/03/12 0800  BP: 127/80  137/77 120/57  Pulse: 73 61 76 64  Temp: 98.6 F (37 C)  98.9 F (37.2 C) 99.3 F (37.4 C)  TempSrc: Oral  Oral Oral  Resp: 24 27 31 22   Height:      Weight:   77 kg (169 lb 12.1 oz)   SpO2: 91% 91% 97% 97%    Intake/Output Summary (Last 24 hours) at 06/03/12 0939 Last data filed at 06/03/12 0755  Gross per 24 hour  Intake      3 ml  Output   2850 ml  Net  -2847 ml   Filed Weights   06/02/12 0436 06/02/12 0444 06/03/12 0339  Weight: 83.5 kg (184 lb 1.4 oz) 79.8 kg (175 lb 14.8 oz) 77 kg (169 lb 12.1 oz)    Exam: Alert afebrile comfortable.  Cardiovascular: s1s2 bradycardic. Respiratory: Effort normal and breath sounds normal. No respiratory distress. She has no wheezes. She has no rales.  GI: Soft. Bowel sounds are normal. She exhibits no distension. There is no tenderness. There is no rebound.  Musculoskeletal: She exhibits no edema and no tenderness.     Data Reviewed: Basic Metabolic Panel:  Lab 06/03/12 4401 06/02/12 0550 06/02/12 0153 06/02/12 0147  NA 141 141 138 135  K 3.8 3.6 3.7 3.7  CL 107 107 108 101  CO2 22 24 -- 17*  GLUCOSE 91 119* 362*  363*  BUN 17 15 14 13   CREATININE 2.18* 1.94* 1.90* 1.89*  CALCIUM 12.0* 11.4* -- 11.1*  MG -- 2.1 -- --  PHOS -- -- -- --   Liver Function Tests:  Lab 06/02/12 0147  AST 26  ALT 14  ALKPHOS 177*  BILITOT 0.2*  PROT 7.4  ALBUMIN 3.4*   No results found for this basename: LIPASE:5,AMYLASE:5 in the last 168 hours No results found for this basename: AMMONIA:5 in the last 168 hours CBC:  Lab 06/03/12 0800 06/02/12 0550 06/02/12 0153 06/02/12 0147  WBC 9.2 11.6* -- 14.0*  NEUTROABS -- -- -- --  HGB 9.4* 9.8* 11.6* 10.6*  HCT 29.2* 31.1* 34.0* 33.3*  MCV  81.8 82.9 -- 83.9  PLT 256 241 -- 322   Cardiac Enzymes:  Lab 06/02/12 1619 06/02/12 1140 06/02/12 0550  CKTOTAL -- -- --  CKMB -- -- --  CKMBINDEX -- -- --  TROPONINI <0.30 <0.30 <0.30   BNP (last 3 results)  Basename 06/02/12 0148 04/15/12 2000 04/06/12 1957  PROBNP 1173.0* 1866.0* 1727.0*   CBG:  Lab 06/03/12 0751 06/02/12 2141 06/02/12 1646 06/02/12 1220 06/02/12 0753  GLUCAP 91 85 66* 62* 111*    Recent Results (from the past 240 hour(s))  MRSA PCR SCREENING     Status: Abnormal   Collection Time   06/02/12  5:37 AM      Component Value Range Status Comment   MRSA by PCR POSITIVE (*) NEGATIVE Final      Studies: Dg Chest Portable 1 View  06/02/2012  *RADIOLOGY REPORT*  Clinical Data: Respiratory distress.  History of smoking.  PORTABLE CHEST - 1 VIEW  Comparison: Chest radiograph performed 04/15/2012  Findings: The lungs are well-aerated.  Diffuse bilateral airspace opacification is noted, particularly on the left side.  This raises suspicion for multifocal pneumonia, though asymmetric pulmonary edema might have a similar appearance.  No significant pleural effusion or pneumothorax is seen; trace fluid is seen tracking along the right minor fissure.  The heart is mildly enlarged.  No acute osseous abnormalities are seen.  IMPRESSION: Diffuse bilateral airspace opacification, particularly on the left side.  This raises suspicion for diffuse multifocal pneumonia, though asymmetric pulmonary edema might have a similar appearance. Mild cardiomegaly.   Original Report Authenticated By: Tonia Ghent, M.D.     Scheduled Meds:    . amiodarone  200 mg Oral Daily  . amLODipine  5 mg Oral Daily  . atorvastatin  20 mg Oral q1800  . ceFEPime (MAXIPIME) IV  1 g Intravenous Q24H  . Chlorhexidine Gluconate Cloth  6 each Topical Q0600  . ferrous sulfate  325 mg Oral Q breakfast  . furosemide  40 mg Intravenous Daily  . insulin aspart  0-9 Units Subcutaneous TID WC  . levofloxacin  (LEVAQUIN) IV  500 mg Intravenous Q48H  . metoprolol tartrate  25 mg Oral BID  . mupirocin ointment  1 application Nasal BID  . sodium chloride  3 mL Intravenous Q12H  . sodium chloride  3 mL Intravenous Q12H  . vancomycin  750 mg Intravenous Q24H  . warfarin  5 mg Oral ONCE-1800  . Warfarin - Pharmacist Dosing Inpatient   Does not apply q1800   Continuous Infusions:   Principal Problem:  *Acute respiratory failure with hypoxia Active Problems:  CHRONIC KIDNEY DISEASE STAGE V  CARDIOMYOPATHY, IDIOPATHIC HYPERTROPHIC  Acute on chronic diastolic congestive heart failure        [name]  [facility] Pager [phone]. If  8PM-8AM, please contact night-coverage at [email or web address], password [facility] 06/03/2012, 9:39 AM  LOS: 1 day

## 2012-06-03 NOTE — Progress Notes (Signed)
ANTICOAGULATION CONSULT NOTE - Initial Consult  Pharmacy Consult for Coumadin Indication: atrial fibrillation   No Known Allergies  Patient Measurements: Height: 5\' 3"  (160 cm) Weight: 169 lb 12.1 oz (77 kg) IBW/kg (Calculated) : 52.4   Vital Signs: Temp: 98.9 F (37.2 C) (01/25 0339) Temp src: Oral (01/25 0339) BP: 137/77 mmHg (01/25 0339) Pulse Rate: 76  (01/25 0339)  Labs:  Basename 06/03/12 0440 06/02/12 1619 06/02/12 1140 06/02/12 0550 06/02/12 0153 06/02/12 0147  HGB -- -- -- 9.8* 11.6* --  HCT -- -- -- 31.1* 34.0* 33.3*  PLT -- -- -- 241 -- 322  APTT -- -- -- -- -- --  LABPROT 24.5* -- -- -- -- 26.1*  INR 2.33* -- -- -- -- 2.54*  HEPARINUNFRC -- -- -- -- -- --  CREATININE -- -- -- 1.94* 1.90* 1.89*  CKTOTAL -- -- -- -- -- --  CKMB -- -- -- -- -- --  TROPONINI -- <0.30 <0.30 <0.30 -- --    Estimated Creatinine Clearance: 26.1 ml/min (by C-G formula based on Cr of 1.94).   Medical History: Past Medical History  Diagnosis Date  . CAD (coronary artery disease)     cath 11/11: LAD 40%, mid-dist 25-30%; prox CFX 30%, mid 40%; OM 50%; PDA 50%; PLV 50%;  EF 65%  . Acute myocardial infarction     in setting of AFib with RVR 03/2010  . Atrial fibrillation     DCCV 11/17/11; coumadin & amiodarone  . HOCM (hypertrophic obstructive cardiomyopathy)     Echo 11/2011:severe LVH, EF 65-70%, mid-cavity gradient up to  . Hypertension   . Tobacco abuse   . Chronic kidney disease     Stage 4  . CHF (congestive heart failure)     preserved LVF  . Iron deficiency anemia   . Third degree uterine prolapse   . Hx MRSA infection   . Hx of cardiovascular stress test     a. Lex MV 12/13:  EF 56%, no ischemia    . Diabetes     Medications:  Prescriptions prior to admission  Medication Sig Dispense Refill  . acetaminophen (TYLENOL) 500 MG tablet Take 500 mg by mouth 2 (two) times daily as needed. For pain      . amiodarone (PACERONE) 200 MG tablet Take 200 mg by mouth  daily.       Marland Kitchen amLODipine (NORVASC) 5 MG tablet Take 1 tablet (5 mg total) by mouth daily.  30 tablet  11  . furosemide (LASIX) 20 MG tablet Take 20 mg by mouth every evening.      . metoprolol tartrate (LOPRESSOR) 25 MG tablet Take 25 mg by mouth 2 (two) times daily.      . nitroGLYCERIN (NITROSTAT) 0.4 MG SL tablet Place 0.4 mg under the tongue every 5 (five) minutes as needed. For chest pain      . rosuvastatin (CRESTOR) 10 MG tablet Take 10 mg by mouth at bedtime.       Marland Kitchen warfarin (COUMADIN) 5 MG tablet Take 5 mg by mouth every evening. 1/2 tablet on Thursday (2.5 mg) 3 or 4 days 1/2 tablet, other days 1 tablet - written down on paper at home        Assessment: 72 yo female admitted with SOB, possible PNA, h/o AFib on coumadin.  INR is therapeutic at 2.33.    Goal of Therapy:  INR 2-3 Monitor platelets by anticoagulation protocol: Yes   Plan:  Coumadin 5 mg po  x 1 dose tonight (home dose) Daily PT/INR.  Wendie Simmer, PharmD, BCPS Clinical Pharmacist  Pager: [phone]

## 2012-06-03 NOTE — Evaluation (Signed)
Physical Therapy Evaluation Patient Details Name: [name] MRN: [id number] DOB: 07-02-1940 Today's Date: 06/03/2012 Time: 1191-4782 PT Time Calculation (min): 24 min  PT Assessment / Plan / Recommendation Clinical Impression  Pt admitted with SOB, CHF and questionable PNA. Pt states she is able to perform ADLs on her own and dgtr assists with housework and shopping. Pt states her unsteadiness is normal and that when she goes out in the community she can get SOB. Pt would benefit from acute therapy to maximize mobility and safety awareness with DME prior to discharge.     PT Assessment  Patient needs continued PT services    Follow Up Recommendations  No PT follow up    Does the patient have the potential to tolerate intense rehabilitation      Barriers to Discharge Decreased caregiver support      Equipment Recommendations  Other (comment) (rollator)    Recommendations for Other Services     Frequency Min 3X/week    Precautions / Restrictions Precautions Precautions: Fall   Pertinent Vitals/Pain Vitals stable No pain      Mobility  Bed Mobility Bed Mobility: Not assessed Transfers Transfers: Sit to Stand;Stand to Sit Sit to Stand: 6: Modified independent (Device/Increase time);From bed Stand to Sit: 6: Modified independent (Device/Increase time);To chair/3-in-1 Ambulation/Gait Ambulation/Gait Assistance: 4: Min guard Ambulation Distance (Feet): 150 Feet Assistive device: None Ambulation/Gait Assistance Details: pt reaching for hand rail at times and 3 noticeable partial LOB with gait pt corrected with hand rail or stop in gait which pt reports as normal and that she would use rollator if she had it Gait Pattern: Step-through pattern;Decreased stride length Gait velocity: decreased Stairs: No    Shoulder Instructions     Exercises     PT Diagnosis: Difficulty walking  PT Problem List: Decreased activity tolerance;Decreased knowledge of use of DME PT  Treatment Interventions: Gait training;DME instruction;Therapeutic activities;Therapeutic exercise;Patient/family education   PT Goals Acute Rehab PT Goals PT Goal Formulation: With patient Time For Goal Achievement: 06/10/12 Potential to Achieve Goals: Good Pt will go Supine/Side to Sit: with modified independence;with HOB 0 degrees PT Goal: Supine/Side to Sit - Progress: Goal set today Pt will Ambulate: >150 feet;with least restrictive assistive device;with modified independence PT Goal: Ambulate - Progress: Goal set today Pt will Perform Home Exercise Program: Independently PT Goal: Perform Home Exercise Program - Progress: Goal set today  Visit Information  Last PT Received On: 06/03/12 Assistance Needed: +1    Subjective Data  Subjective: I just got my lunch after moving from 3300 Patient Stated Goal: go home   Prior Functioning  Home Living Lives With: Alone Available Help at Discharge: Family;Available PRN/intermittently Type of Home: Apartment Home Access: Elevator Home Layout: One level Bathroom Shower/Tub: Forensic psychologist: None Prior Function Level of Independence: Independent Driving: No Vocation: Retired 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, 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    Cognition  Overall Cognitive Status: Appears within functional limits for tasks assessed/performed Arousal/Alertness: Awake/alert Orientation Level: Appears intact for tasks assessed Behavior During Session: [facility] for tasks performed    Extremity/Trunk Assessment Right Upper Extremity Assessment RUE ROM/Strength/Tone: [facility] for tasks assessed Left Upper Extremity Assessment LUE ROM/Strength/Tone: WFL for tasks assessed Right Lower Extremity Assessment RLE ROM/Strength/Tone: [facility] for tasks assessed Left Lower Extremity Assessment LLE ROM/Strength/Tone: WFL for tasks assessed Trunk Assessment Trunk Assessment: Normal   Balance  Static Sitting Balance Static Sitting - Balance Support: No upper extremity supported;Feet supported Static  Sitting - Level of Assistance: 7: Independent Static Sitting - Comment/# of Minutes: 5 Static Standing Balance Static Standing - Balance Support: No upper extremity supported Static Standing - Level of Assistance: 7: Independent Static Standing - Comment/# of Minutes: 2  End of Session PT - End of Session Activity Tolerance: Patient tolerated treatment well Patient left: in chair;with call bell/phone within reach  GP     Delorse Lek 06/03/2012, 2:10 PM  Delaney Meigs, PT [phone]

## 2012-06-03 NOTE — Progress Notes (Signed)
Patient transferred to 4732.  Report called to RN on 4700 and all questions answered. Patient transferred via wheelchair with NT.  RN aware that order for foley d/c'd.  Family aware of new room assignment.

## 2012-06-04 DIAGNOSIS — E119 Type 2 diabetes mellitus without complications: Secondary | ICD-10-CM

## 2012-06-04 LAB — BASIC METABOLIC PANEL
BUN: 20 mg/dL (ref 6–23)
CO2: 22 mEq/L (ref 19–32)
Glucose, Bld: 112 mg/dL — ABNORMAL HIGH (ref 70–99)
Potassium: 3.7 mEq/L (ref 3.5–5.1)
Sodium: 139 mEq/L (ref 135–145)

## 2012-06-04 LAB — GLUCOSE, CAPILLARY: Glucose-Capillary: 152 mg/dL — ABNORMAL HIGH (ref 70–99)

## 2012-06-04 MED ORDER — FUROSEMIDE 20 MG PO TABS
20.0000 mg | ORAL_TABLET | Freq: Every day | ORAL | Status: DC
  Administered 2012-06-04 – 2012-06-05 (×2): 20 mg via ORAL
  Filled 2012-06-04 (×2): qty 1

## 2012-06-04 MED ORDER — INSULIN ASPART 100 UNIT/ML ~~LOC~~ SOLN: 0.0000 [IU] | Freq: Three times a day (TID) | SUBCUTANEOUS | Status: DC

## 2012-06-04 MED ORDER — LEVOFLOXACIN 500 MG PO TABS: 500.0000 mg | ORAL_TABLET | Freq: Every day | ORAL | Status: DC

## 2012-06-04 MED ORDER — WARFARIN SODIUM 5 MG PO TABS
5.0000 mg | ORAL_TABLET | Freq: Once | ORAL | Status: AC
  Administered 2012-06-04: 5 mg via ORAL
  Filled 2012-06-04: qty 1

## 2012-06-04 MED ORDER — FUROSEMIDE 20 MG PO TABS: 40.0000 mg | ORAL_TABLET | Freq: Every evening | ORAL | Status: DC

## 2012-06-04 MED ORDER — TECHNETIUM TC 99M SESTAMIBI GENERIC - CARDIOLITE: 10.0000 | Freq: Once | INTRAVENOUS | Status: AC | PRN

## 2012-06-04 MED ORDER — FUROSEMIDE 40 MG PO TABS
40.0000 mg | ORAL_TABLET | Freq: Every day | ORAL | Status: DC
  Filled 2012-06-04: qty 1

## 2012-06-04 NOTE — Progress Notes (Signed)
[facility] PROGRESS NOTE  [name] ZOX:[id number] DOB: 10/02/1940 DOA: 06/02/2012 PCP: No primary provider on file.  Assessment/Plan:  #1. Acute respiratory failure hypoxic - probably from acute diastolic CHF , non compliant to medications. Nitro drip off, on RA with sats in 100%, She was started on IV lasix daily, . I/O last 3 completed shifts: In: 1467 [P.O.:1110; I.V.:3; IV Piggyback:354] Out: 2900 [Urine:2900] Total I/O In: 244 [P.O.:140; IV Piggyback:104] Out: 175 [Urine:175]   She diuresed about 4 liters from admission.  We changed to po lasix and transferred her to telemetry for further management.   Will Closely follow intake output and daily weights and repeat metabolic panel in am. Since there was a concern for possible pneumonia we will continue with antibiotics, Repeat chest x-ray shows improvement in her chest infiltrates , we have de escalated the antibiotics to every other day levaquin.  flu panel negative. Cardiac enzymes negative.    #2. Lactic acidosis - resolved. not sure what caused it. Denies any chest pain or abdominal pain nausea vomiting or diarrhea  #3. Atrial fibrillation - rate moderately controlled. Continue with Coumadin as per pharmacy and amiodarone. INR therapeutic. #4. Diabetes mellitus type 2 - continue with her medications with sliding-scale coverage.  CBG (last 3)   Basename 06/04/12 1058 06/04/12 0618 06/03/12 2121  GLUCAP 131* 152* 130*   Her hgba1c is 8.7 . Patient had cbg's in 60's. Will not start her on oral hypoglycemic medications and metformin is not indicated as her creatinine is 2.3.   #5. Chronic kidney disease - creatinine slightly elevated from IV lasix. Decreased lasix to po lasix. Closely follow intake output.  #6. Hypercalcemia -  Will get PTH and ionized calcium.  She had previous episodes of hypercalcemia which were not followed up .  Her calcium today is 12.1. She is currently asymptomatic from the hypercalcemia. Her  last PTH  Is from November and is 681. We will get a sestamibi scan and get 24 hour urine calcium to evaluate for primary hyperparathyroidism. We will also get vitamin d levels to evaluate for secondary hyperparathyroidism. Her alkaline phosphatase  Is elevated.    #7. Chronic anemia - probably from kidney disease. Follow CBC. #8 Iron deficiency anemia -Iron saturation 19%--start patient on ferrous sulfate    Code Status: full code Family Communication: none at bedside Disposition Plan: transfer to telemetry.    Consultants: none  Antibiotics:  Vancomycin 06/02/12- 06/04/12  Cefepime1/24 to 1/26  levaquin from 06/02/12>>> HPI/Subjective: Comfortable, frustrated that nobody told her about her diabetes and her elevated calcium level.   Objective: Filed Vitals:   06/03/12 1220 06/03/12 1957 06/04/12 0520 06/04/12 1012  BP: 143/78 135/90 122/76 118/70  Pulse: 60 61 58 55  Temp: 98.9 F (37.2 C) 98.9 F (37.2 C) 98.8 F (37.1 C)   TempSrc: Oral Oral Oral   Resp: 24 18 18 18   Height: 5\' 3"  (1.6 m)     Weight: 81.1 kg (178 lb 12.7 oz)  80.876 kg (178 lb 4.8 oz)   SpO2: 97% 97% 100% 96%    Intake/Output Summary (Last 24 hours) at 06/04/12 1152 Last data filed at 06/04/12 1047  Gross per 24 hour  Intake   1204 ml  Output   2025 ml  Net   -821 ml   Filed Weights   06/03/12 0339 06/03/12 1220 06/04/12 0520  Weight: 77 kg (169 lb 12.1 oz) 81.1 kg (178 lb 12.7 oz) 80.876 kg (178 lb 4.8 oz)  Exam: Alert afebrile comfortable.  Cardiovascular: s1s2 bradycardic. Respiratory: Effort normal and breath sounds normal. No respiratory distress. She has no wheezes. She has no rales.  GI: Soft. Bowel sounds are normal. She exhibits no distension. There is no tenderness. There is no rebound.  Musculoskeletal: She exhibits no edema and no tenderness.     Data Reviewed: Basic Metabolic Panel:  Lab 06/04/12 4782 06/03/12 0800 06/02/12 0550 06/02/12 0153 06/02/12 0147  NA 139 141  141 138 135  K 3.7 3.8 3.6 3.7 3.7  CL 104 107 107 108 101  CO2 22 22 24  -- 17*  GLUCOSE 112* 91 119* 362* 363*  BUN 20 17 15 14 13   CREATININE 2.39* 2.18* 1.94* 1.90* 1.89*  CALCIUM 12.1* 12.0* 11.4* -- 11.1*  MG 2.2 -- 2.1 -- --  PHOS -- -- -- -- --   Liver Function Tests:  Lab 06/02/12 0147  AST 26  ALT 14  ALKPHOS 177*  BILITOT 0.2*  PROT 7.4  ALBUMIN 3.4*   No results found for this basename: LIPASE:5,AMYLASE:5 in the last 168 hours No results found for this basename: AMMONIA:5 in the last 168 hours CBC:  Lab 06/03/12 0800 06/02/12 0550 06/02/12 0153 06/02/12 0147  WBC 9.2 11.6* -- 14.0*  NEUTROABS -- -- -- --  HGB 9.4* 9.8* 11.6* 10.6*  HCT 29.2* 31.1* 34.0* 33.3*  MCV 81.8 82.9 -- 83.9  PLT 256 241 -- 322   Cardiac Enzymes:  Lab 06/02/12 1619 06/02/12 1140 06/02/12 0550  CKTOTAL -- -- --  CKMB -- -- --  CKMBINDEX -- -- --  TROPONINI <0.30 <0.30 <0.30   BNP (last 3 results)  Basename 06/02/12 0148 04/15/12 2000 04/06/12 1957  PROBNP 1173.0* 1866.0* 1727.0*   CBG:  Lab 06/04/12 1058 06/04/12 0618 06/03/12 2121 06/03/12 1616 06/03/12 1154  GLUCAP 131* 152* 130* 142* 99    Recent Results (from the past 240 hour(s))  MRSA PCR SCREENING     Status: Abnormal   Collection Time   06/02/12  5:37 AM      Component Value Range Status Comment   MRSA by PCR POSITIVE (*) NEGATIVE Final      Studies: Dg Chest 2 View  06/03/2012  *RADIOLOGY REPORT*  Clinical Data: 72 year old female with resolving pulmonary edema/pneumonia.  CHEST - 2 VIEW  Comparison: 06/02/2012 and earlier.  Findings: AP and lateral views of the chest.  Significantly improved perihilar and basilar ventilation. Stable cardiomegaly and mediastinal contours.  Tortuous thoracic aorta.  No pneumothorax. No definite pleural effusion.  No consolidation. No acute osseous abnormality identified.  IMPRESSION: Significantly improved ventilation since yesterday suggesting resolving pulmonary edema. No new  cardiopulmonary abnormality.   Original Report Authenticated By: Erskine Speed, M.D.     Scheduled Meds:    . amiodarone  200 mg Oral Daily  . amLODipine  5 mg Oral Daily  . atorvastatin  20 mg Oral q1800  . Chlorhexidine Gluconate Cloth  6 each Topical Q0600  . ferrous sulfate  325 mg Oral Q breakfast  . furosemide  40 mg Oral Daily  . insulin aspart  0-9 Units Subcutaneous TID WC  . levofloxacin (LEVAQUIN) IV  500 mg Intravenous Q48H  . metoprolol tartrate  25 mg Oral BID  . mupirocin ointment  1 application Nasal BID  . sodium chloride  3 mL Intravenous Q12H  . sodium chloride  3 mL Intravenous Q12H  . Warfarin - Pharmacist Dosing Inpatient   Does not apply q1800   Continuous  Infusions:   Principal Problem:  *Acute respiratory failure with hypoxia Active Problems:  HYPERTENSION  CHRONIC KIDNEY DISEASE STAGE V  CARDIOMYOPATHY, IDIOPATHIC HYPERTROPHIC  FIBRILLATION, ATRIAL  Lactic acidosis  Hypercalcemia  Acute on chronic diastolic congestive heart failure  Diabetes mellitus        Ziv Welchel  [facility] Pager [phone]. If 8PM-8AM, please contact night-coverage at [email or web address], password [facility] 06/04/2012, 11:52 AM  LOS: 2 days

## 2012-06-04 NOTE — Progress Notes (Signed)
ANTICOAGULATION CONSULT NOTE - Follow up Consult  Pharmacy Consult for Coumadin Indication: atrial fibrillation   No Known Allergies  Patient Measurements: Height: 5\' 3"  (160 cm) Weight: 178 lb 4.8 oz (80.876 kg) (scale c) IBW/kg (Calculated) : 52.4   Vital Signs: Temp: 98.8 F (37.1 C) (01/26 0520) Temp src: Oral (01/26 0520) BP: 118/70 mmHg (01/26 1012) Pulse Rate: 55  (01/26 1012)  Labs:  Basename 06/04/12 0559 06/03/12 0800 06/03/12 0440 06/02/12 1619 06/02/12 1140 06/02/12 0550 06/02/12 0153 06/02/12 0147  HGB -- 9.4* -- -- -- 9.8* -- --  HCT -- 29.2* -- -- -- 31.1* 34.0* --  PLT -- 256 -- -- -- 241 -- 322  APTT -- -- -- -- -- -- -- --  LABPROT 23.1* -- 24.5* -- -- -- -- 26.1*  INR 2.15* -- 2.33* -- -- -- -- 2.54*  HEPARINUNFRC -- -- -- -- -- -- -- --  CREATININE 2.39* 2.18* -- -- -- 1.94* -- --  CKTOTAL -- -- -- -- -- -- -- --  CKMB -- -- -- -- -- -- -- --  TROPONINI -- -- -- <0.30 <0.30 <0.30 -- --    Estimated Creatinine Clearance: 21.7 ml/min (by C-G formula based on Cr of 2.39).   Medical History: Past Medical History  Diagnosis Date  . CAD (coronary artery disease)     cath 11/11: LAD 40%, mid-dist 25-30%; prox CFX 30%, mid 40%; OM 50%; PDA 50%; PLV 50%;  EF 65%  . Acute myocardial infarction     in setting of AFib with RVR 03/2010  . Atrial fibrillation     DCCV 11/17/11; coumadin & amiodarone  . HOCM (hypertrophic obstructive cardiomyopathy)     Echo 11/2011:severe LVH, EF 65-70%, mid-cavity gradient up to  . Hypertension   . Tobacco abuse   . Chronic kidney disease     Stage 4  . CHF (congestive heart failure)     preserved LVF  . Iron deficiency anemia   . Third degree uterine prolapse   . Hx MRSA infection   . Hx of cardiovascular stress test     a. Lex MV 12/13:  EF 56%, no ischemia    . Diabetes     Medications:  Prescriptions prior to admission  Medication Sig Dispense Refill  . acetaminophen (TYLENOL) 500 MG tablet Take 500 mg  by mouth 2 (two) times daily as needed. For pain      . amiodarone (PACERONE) 200 MG tablet Take 200 mg by mouth daily.       Marland Kitchen amLODipine (NORVASC) 5 MG tablet Take 1 tablet (5 mg total) by mouth daily.  30 tablet  11  . metoprolol tartrate (LOPRESSOR) 25 MG tablet Take 25 mg by mouth 2 (two) times daily.      . nitroGLYCERIN (NITROSTAT) 0.4 MG SL tablet Place 0.4 mg under the tongue every 5 (five) minutes as needed. For chest pain      . rosuvastatin (CRESTOR) 10 MG tablet Take 10 mg by mouth at bedtime.       Marland Kitchen warfarin (COUMADIN) 5 MG tablet Take 5 mg by mouth every evening. 1/2 tablet on Thursday (2.5 mg) 3 or 4 days 1/2 tablet, other days 1 tablet - written down on paper at home      . [DISCONTINUED] furosemide (LASIX) 20 MG tablet Take 20 mg by mouth every evening.        Assessment: 72 yo female admitted with SOB, possible PNA, h/o AFib on  coumadin.  INR is therapeutic at 2.15.  No bleeding noted  Goal of Therapy:  INR 2-3 Monitor platelets by anticoagulation protocol: Yes   Plan:  Coumadin 5 mg po x 1 dose tonight  Daily PT/INR.    Doris Cheadle, PharmD Clinical Pharmacist Pager: [phone] Phone: [phone] 06/04/2012 12:12 PM

## 2012-06-05 LAB — GLUCOSE, CAPILLARY: Glucose-Capillary: 108 mg/dL — ABNORMAL HIGH (ref 70–99)

## 2012-06-05 LAB — PROTIME-INR: INR: 2.41 — ABNORMAL HIGH (ref 0.00–1.49)

## 2012-06-05 LAB — PTH, INTACT AND CALCIUM: Calcium, Total (PTH): 11.3 mg/dL — ABNORMAL HIGH (ref 8.4–10.5)

## 2012-06-05 LAB — BASIC METABOLIC PANEL
CO2: 22 mEq/L (ref 19–32)
GFR calc non Af Amer: 19 mL/min — ABNORMAL LOW (ref >90)
Glucose, Bld: 105 mg/dL — ABNORMAL HIGH (ref 70–99)
Potassium: 3.3 mEq/L — ABNORMAL LOW (ref 3.5–5.1)
Sodium: 137 mEq/L (ref 135–145)

## 2012-06-05 LAB — VITAMIN D 25 HYDROXY (VIT D DEFICIENCY, FRACTURES): Vit D, 25-Hydroxy: 10 ng/mL — ABNORMAL LOW (ref 30–89)

## 2012-06-05 MED ORDER — FUROSEMIDE 20 MG PO TABS: 20.0000 mg | ORAL_TABLET | Freq: Every day | ORAL | Status: DC

## 2012-06-05 MED ORDER — WARFARIN SODIUM 2.5 MG PO TABS
2.5000 mg | ORAL_TABLET | ORAL | Status: DC
  Filled 2012-06-05: qty 1

## 2012-06-05 MED ORDER — LIVING WELL WITH DIABETES BOOK
Freq: Once | Status: AC
  Administered 2012-06-05: 13:00:00
  Filled 2012-06-05: qty 1

## 2012-06-05 MED ORDER — WARFARIN SODIUM 5 MG PO TABS: 5.0000 mg | ORAL_TABLET | ORAL | Status: DC

## 2012-06-05 MED ORDER — LEVOFLOXACIN 500 MG PO TABS: 500.0000 mg | ORAL_TABLET | Freq: Every day | ORAL | Status: DC

## 2012-06-05 MED ORDER — POTASSIUM CHLORIDE CRYS ER 20 MEQ PO TBCR
40.0000 meq | EXTENDED_RELEASE_TABLET | Freq: Once | ORAL | Status: AC
  Administered 2012-06-05: 40 meq via ORAL
  Filled 2012-06-05: qty 2

## 2012-06-05 NOTE — Progress Notes (Signed)
ANTICOAGULATION CONSULT NOTE - Follow up Consult  Pharmacy Consult for Coumadin Indication: atrial fibrillation   No Known Allergies  Patient Measurements: Height: 5\' 3"  (160 cm) Weight: 176 lb 14.4 oz (80.241 kg) (c scale) IBW/kg (Calculated) : 52.4   Vital Signs: Temp: 97.9 F (36.6 C) (01/27 0631) Temp src: Oral (01/27 0631) BP: 130/62 mmHg (01/27 0631) Pulse Rate: 54  (01/27 0631)  Labs:  Alvira Philips 06/05/12 0610 06/04/12 0559 06/03/12 0800 06/03/12 0440 06/02/12 1619 06/02/12 1140  HGB -- -- 9.4* -- -- --  HCT -- -- 29.2* -- -- --  PLT -- -- 256 -- -- --  APTT -- -- -- -- -- --  LABPROT 25.1* 23.1* -- 24.5* -- --  INR 2.41* 2.15* -- 2.33* -- --  HEPARINUNFRC -- -- -- -- -- --  CREATININE 2.37* 2.39* 2.18* -- -- --  CKTOTAL -- -- -- -- -- --  CKMB -- -- -- -- -- --  TROPONINI -- -- -- -- <0.30 <0.30    Estimated Creatinine Clearance: 21.8 ml/min (by C-G formula based on Cr of 2.37).   Medical History: Past Medical History  Diagnosis Date  . CAD (coronary artery disease)     cath 11/11: LAD 40%, mid-dist 25-30%; prox CFX 30%, mid 40%; OM 50%; PDA 50%; PLV 50%;  EF 65%  . Acute myocardial infarction     in setting of AFib with RVR 03/2010  . Atrial fibrillation     DCCV 11/17/11; coumadin & amiodarone  . HOCM (hypertrophic obstructive cardiomyopathy)     Echo 11/2011:severe LVH, EF 65-70%, mid-cavity gradient up to  . Hypertension   . Tobacco abuse   . Chronic kidney disease     Stage 4  . CHF (congestive heart failure)     preserved LVF  . Iron deficiency anemia   . Third degree uterine prolapse   . Hx MRSA infection   . Hx of cardiovascular stress test     a. Lex MV 12/13:  EF 56%, no ischemia    . Diabetes     Medications:  Prescriptions prior to admission  Medication Sig Dispense Refill  . acetaminophen (TYLENOL) 500 MG tablet Take 500 mg by mouth 2 (two) times daily as needed. For pain      . amiodarone (PACERONE) 200 MG tablet Take 200 mg  by mouth daily.       Marland Kitchen amLODipine (NORVASC) 5 MG tablet Take 1 tablet (5 mg total) by mouth daily.  30 tablet  11  . metoprolol tartrate (LOPRESSOR) 25 MG tablet Take 25 mg by mouth 2 (two) times daily.      . nitroGLYCERIN (NITROSTAT) 0.4 MG SL tablet Place 0.4 mg under the tongue every 5 (five) minutes as needed. For chest pain      . rosuvastatin (CRESTOR) 10 MG tablet Take 10 mg by mouth at bedtime.       Marland Kitchen warfarin (COUMADIN) 5 MG tablet Take 5 mg by mouth every evening. 1/2 tablet on Thursday (2.5 mg) 3 or 4 days 1/2 tablet, other days 1 tablet - written down on paper at home      . [DISCONTINUED] furosemide (LASIX) 20 MG tablet Take 20 mg by mouth every evening.        Assessment:  SOB  Anticoagulation:Afib on Coumadin 2.5mg  alt with 5 mg daily PTA (admit INR 2.54). On amiod. INR=2.41  Goal of Therapy:  INR 2-3 Monitor platelets by anticoagulation protocol: Yes   Plan:  Resume  home Coumadin regimen 2.5mg  alt with 5 mg daily    Marieann Zipp S. Merilynn Finland, PharmD, [facility] Clinical Staff Pharmacist Pager [phone]  06/05/2012 8:43 AM

## 2012-06-05 NOTE — Progress Notes (Signed)
Physical Therapy Treatment/ Discharge Patient Details Name: [name] MRN: [id number] DOB: [birth year] Today's Date: 06/05/2012 Time: 4098-1191 PT Time Calculation (min): 24 min  PT Assessment / Plan / Recommendation Comments on Treatment Session  Pt admitted with SOB with improved mobility and balance with use of Rw for gait. Pt educated for use of cane in home since she states furiture too close for use of rollator in doors and that she can get cane from dgtr and use of rollator out of the apartment. Pt able to perform head turns and change of direction safely with use of RW without any LOB today. Pt with safe use of DME, all education completed and goals met without further therapy needs at this time. Pt aware and agreeable and recommend daily ambulation acutely.     Follow Up Recommendations        Does the patient have the potential to tolerate intense rehabilitation     Barriers to Discharge        Equipment Recommendations    rollator   Recommendations for Other Services    Frequency     Plan Discharge plan remains appropriate;Frequency remains appropriate    Precautions / Restrictions Precautions Precautions: Fall   Pertinent Vitals/Pain No pain    Mobility  Bed Mobility Bed Mobility: Not assessed Transfers Sit to Stand: 6: Modified independent (Device/Increase time);From bed Stand to Sit: 6: Modified independent (Device/Increase time);To chair/3-in-1;With armrests Ambulation/Gait Ambulation/Gait Assistance: 6: Modified independent (Device/Increase time) (mod I after initial instruction for RW use) Ambulation Distance (Feet): 400 Feet Assistive device: Rolling walker Ambulation/Gait Assistance Details: cueing for position in rw Gait Pattern: Within Functional Limits Stairs: No    Exercises General Exercises - Lower Extremity Long Arc Quad: AROM;Both;20 reps;Seated Hip Flexion/Marching: AROM;Both;20 reps;Seated Toe Raises: AROM;Both;20 reps;Seated Heel  Raises: AROM;Both;20 reps;Seated   PT Diagnosis:    PT Problem List:   PT Treatment Interventions:     PT Goals Acute Rehab PT Goals PT Goal: Supine/Side to Sit - Progress: Other (comment) (pt did not demonstrate but states no difficulty) PT Goal: Ambulate - Progress: Met PT Goal: Perform Home Exercise Program - Progress: Met  Visit Information  Last PT Received On: 06/05/12 Assistance Needed: +1    Subjective Data  Subjective: "i dont want that walker"- pt prefers rollator   Cognition  Overall Cognitive Status: Appears within functional limits for tasks assessed/performed Arousal/Alertness: Awake/alert Orientation Level: Appears intact for tasks assessed Behavior During Session: [facility] for tasks performed    Balance     End of Session PT - End of Session Activity Tolerance: Patient tolerated treatment well Patient left: in chair;with call bell/phone within reach Nurse Communication: Mobility status   GP     Toney Sang Beth 06/05/2012, 1:04 PM Delaney Meigs, PT [phone]

## 2012-06-05 NOTE — Progress Notes (Signed)
Inpatient Diabetes Program Recommendations  AACE/ADA: New Consensus Statement on Inpatient Glycemic Control (2013)  Target Ranges:  Prepandial:   less than 140 mg/dL      Peak postprandial:   less than 180 mg/dL (1-2 hours)      Critically ill patients:  140 - 180 mg/dL   Reason for Visit: F/U regarding need for Outpatient Diabetes Education  Note: Patient anticipates that she will have problems arranging transportation, but is agreeable to attend if she can get there.  Referral placed to [facility].  Alerted them to transportation problem.  Also, patient does not have a glucose meter.  Will need meter instruction and Rx.

## 2012-06-05 NOTE — Progress Notes (Signed)
Patient and family taught how to give an insulin injection.  Family demonstrated drawing the insulin into the syringe.

## 2012-06-05 NOTE — Progress Notes (Signed)
Patient discharged and verbalizes understanding of all discharge paperwork. Anastasia Fiedler RN

## 2012-06-06 LAB — CALCIUM, URINE, 24 HOUR: Urine Total Volume-UCA24: 600 mL

## 2012-06-08 LAB — VITAMIN D 1,25 DIHYDROXY
Vitamin D 1, 25 (OH)2 Total: 36 pg/mL (ref 18–72)
Vitamin D2 1, 25 (OH)2: <8 pg/mL
Vitamin D3 1, 25 (OH)2: 36 pg/mL

## 2012-06-15 NOTE — Discharge Summary (Signed)
Physician Discharge Summary  [name] UYQ:[id number] DOB: 10-Oct-1940 DOA: 06/02/2012  PCP: No primary provider on file.  Admit date: 06/02/2012 Discharge date: 06/15/2012 Recommendations for Outpatient Follow-up:  1. Follow up with PCP regarding your hypercalcemia. 2. Follow up with BMP in one week.   Discharge Diagnoses:  Principal Problem:  *Acute respiratory failure with hypoxia Active Problems:  HYPERTENSION  CHRONIC KIDNEY DISEASE STAGE V  CARDIOMYOPATHY, IDIOPATHIC HYPERTROPHIC  FIBRILLATION, ATRIAL  Lactic acidosis  Hypercalcemia  Acute on chronic diastolic congestive heart failure  Diabetes mellitus   Discharge Condition: Stable  Diet recommendation: low sodium diet  Filed Weights   06/03/12 1220 06/04/12 0520 06/05/12 0631  Weight: 81.1 kg (178 lb 12.7 oz) 80.876 kg (178 lb 4.8 oz) 80.241 kg (176 lb 14.4 oz)    History of present illness:  72 year-old female with history of diastolic CHF last EF measured was 65-70%, hypertrophic cardiomyopathy, atrial fibrillation on Coumadin, CAD, chronic kidney disease, diabetes mellitus type 2 was brought to the ER after patient started developing sudden onset of shortness of breath while she was sleeping at her house. Patient's shortness of breath was sudden onset denies any associated chest pain fever chills cough or productive sputum. In the ER patient was initially very short of breath and was placed on BiPAP. It nitroglycerin was started and Lasix one dose was given. Patient also start empiric antibiotics has chest x-ray was showing possibility of pneumonia. Patient's condition improved with BiPAP. At this time patient will be admitted to step down unit for close observation. ER patient did discuss with critical care.  Patient states she has been taking her medications as advised did not miss. Denies any nausea vomiting abdominal pain.   Hospital Course:  #1. Acute respiratory failure hypoxic - probably from acute diastolic  CHF , non compliant to medications.  She was started on Nitro drip, which was discontinued because of low BP. She is currently on RA with sats in 100%,  I/O last 3 completed shifts:  In: 1467 [P.O.:1110; I.V.:3; IV Piggyback:354]  Out: 2900 [Urine:2900]  Total I/O  In: 244 [P.O.:140; IV Piggyback:104]  Out: 175 [Urine:175]  She diuresed about 4 liters from admission with IV lasix. We changed to po lasix and transferred her to telemetry for further management.   Since there was a concern for possible pneumonia we will continue with antibiotics, Repeat chest x-ray shows improvement in her chest infiltrates , we have de escalated the antibiotics to every other day levaquin. flu panel negative. Cardiac enzymes negative.  #2. Lactic acidosis - resolved. not sure what caused it. Denies any chest pain or abdominal pain nausea vomiting or diarrhea  #3. Atrial fibrillation - rate moderately controlled. Continue with Coumadin as per pharmacy and amiodarone. INR therapeutic.  #4. Diabetes mellitus type 2 - continue with her medications with sliding-scale coverage.  CBG (last 3)   Basename  06/04/12 1058  06/04/12 0618  06/03/12 2121   GLUCAP  131*  152*  130*    Her hgba1c is 8.7 . Patient had cbg's in 60's. Will not start her on oral hypoglycemic medications and metformin is  contraindicated as her creatinine is 2.3.  #5. Chronic kidney disease - creatinine slightly elevated from IV lasix. Recommend follow up with PCP with a BMP in one week.  #6. Hypercalcemia - Will get PTH and ionized calcium. She had previous episodes of hypercalcemia which were not followed up . Her calcium from admission was elevated between 11 to 12. Marland Kitchen  She is currently asymptomatic from the hypercalcemia. Her last PTH Is from November and is 681. We have ordered sestamibi scan and get 24 hour urine calcium to evaluate for primary hyperparathyroidism. We will also get vitamin d levels to evaluate for secondary hyperparathyroidism. But  patient wants to go home and follow up with her PCP regarding the hypercalcemia.  Her alkaline phosphatase Is elevated.  #7. Chronic anemia - probably from kidney disease.  #8 Iron deficiency anemia -Iron saturation 19%--start patient on ferrous sulfate       Discharge Exam: Filed Vitals:   06/04/12 2238 06/05/12 0631 06/05/12 1140 06/05/12 1330  BP: 122/68 130/62 126/76 112/71  Pulse: 57 54 61 57  Temp: 98.3 F (36.8 C) 97.9 F (36.6 C) 98.7 F (37.1 C) 98.8 F (37.1 C)  TempSrc: Oral Oral Oral Oral  Resp: 20 18 20 19   Height:      Weight:  80.241 kg (176 lb 14.4 oz)    SpO2: 97% 97% 96% 97%   Alert afebrile comfortable.  Cardiovascular: s1s2 bradycardic.  Respiratory: Effort normal and breath sounds normal. No respiratory distress. She has no wheezes. She has no rales.  GI: Soft. Bowel sounds are normal. She exhibits no distension. There is no tenderness. There is no rebound.  Musculoskeletal: She exhibits no edema and no tenderness.       Discharge Instructions  Discharge Orders    Future Appointments: Provider: Department: Dept Phone: Center:   06/28/2012 9:45 AM Young Berry May, RN, RD, CDE [facility] [phone] NDM     Future Orders Please Complete By Expires   Ambulatory referral to Nutrition and Diabetic Education      Comments:   Patient has new-onset diabetes.  Hbg A1C drawn during previous admission, 04-09-2012, was 8.7.  Apparently had been given Rx for an oral agent after last admission, but was advised by her cardiologist not to take it until she followed up with her PCP, Dr. Jonny Ruiz.  Patient to make appointment for follow-up with Dr. Jonny Ruiz regarding diabetes.  Does not have a glucose meter.  Hospital Dietitian to see to provide basic diet education.    Patient anticipates having problems arranging transportation for instruction.  Patient wants to be informed of any co-pay she will need to make.   Patient   Diet -  low sodium heart healthy      Diet - low sodium heart healthy      Discharge instructions      Comments:   Follow up with pcp in 2 weeks. Follow up with cardiology in one week.   Activity as tolerated - No restrictions      (HEART FAILURE PATIENTS) Call MD:  Anytime you have any of the following symptoms: 1) 3 pound weight gain in 24 hours or 5 pounds in 1 week 2) shortness of breath, with or without a dry hacking cough 3) swelling in the hands, feet or stomach 4) if you have to sleep on extra pillows at night in order to breathe.      Call MD for:  persistant dizziness or light-headedness      Call MD for:  difficulty breathing, headache or visual disturbances      Increase activity slowly      (HEART FAILURE PATIENTS) Call MD:  Anytime you have any of the following symptoms: 1) 3 pound weight gain in 24 hours or 5 pounds in 1 week 2) shortness of breath, with or without a  dry hacking cough 3) swelling in the hands, feet or stomach 4) if you have to sleep on extra pillows at night in order to breathe.      Discharge instructions      Comments:   Follow up with cardiology and PCP as recommended.       Medication List     As of 06/15/2012  9:54 PM    TAKE these medications         acetaminophen 500 MG tablet   Commonly known as: TYLENOL   Take 500 mg by mouth 2 (two) times daily as needed. For pain      amiodarone 200 MG tablet   Commonly known as: PACERONE   Take 200 mg by mouth daily.      amLODipine 5 MG tablet   Commonly known as: NORVASC   Take 1 tablet (5 mg total) by mouth daily.      furosemide 20 MG tablet   Commonly known as: LASIX   Take 1 tablet (20 mg total) by mouth daily.      insulin aspart 100 UNIT/ML injection   Commonly known as: novoLOG   Inject 0-9 Units into the skin 3 (three) times daily with meals.      levofloxacin 500 MG tablet   Commonly known as: LEVAQUIN   Take 1 tablet (500 mg total) by mouth daily.      metoprolol tartrate 25 MG tablet    Commonly known as: LOPRESSOR   Take 25 mg by mouth 2 (two) times daily.      nitroGLYCERIN 0.4 MG SL tablet   Commonly known as: NITROSTAT   Place 0.4 mg under the tongue every 5 (five) minutes as needed. For chest pain      rosuvastatin 10 MG tablet   Commonly known as: CRESTOR   Take 10 mg by mouth at bedtime.      warfarin 5 MG tablet   Commonly known as: COUMADIN   Take 5 mg by mouth every evening. 1/2 tablet on Thursday (2.5 mg) 3 or 4 days 1/2 tablet, other days 1 tablet - written down on paper at home           Follow-up Information    Follow up with Oliver Barre, MD. In 2 weeks.   Contact information:   [address] Maggie Schwalbe [place] [address] [phone]       Follow up with Rollene Rotunda, MD. In 1 week.   Contact information:   [address] [place]lyn [place] [address] [phone]           The results of significant diagnostics from this hospitalization (including imaging, microbiology, ancillary and laboratory) are listed below for reference.    Significant Diagnostic Studies: Dg Chest 2 View  06/03/2012  *RADIOLOGY REPORT*  Clinical Data: 72 year old female with resolving pulmonary edema/pneumonia.  CHEST - 2 VIEW  Comparison: 06/02/2012 and earlier.  Findings: AP and lateral views of the chest.  Significantly improved perihilar and basilar ventilation. Stable cardiomegaly and mediastinal contours.  Tortuous thoracic aorta.  No pneumothorax. No definite pleural effusion.  No consolidation. No acute osseous abnormality identified.  IMPRESSION: Significantly improved ventilation since yesterday suggesting resolving pulmonary edema. No new cardiopulmonary abnormality.   Original Report Authenticated By: Erskine Speed, M.D.    Dg Chest Portable 1 View  06/02/2012  *RADIOLOGY REPORT*  Clinical Data: Respiratory distress.  History of smoking.  PORTABLE CHEST -  1 VIEW  Comparison: Chest radiograph performed  04/15/2012  Findings: The lungs are well-aerated.  Diffuse bilateral airspace opacification is noted, particularly on the left side.  This raises suspicion for multifocal pneumonia, though asymmetric pulmonary edema might have a similar appearance.  No significant pleural effusion or pneumothorax is seen; trace fluid is seen tracking along the right minor fissure.  The heart is mildly enlarged.  No acute osseous abnormalities are seen.  IMPRESSION: Diffuse bilateral airspace opacification, particularly on the left side.  This raises suspicion for diffuse multifocal pneumonia, though asymmetric pulmonary edema might have a similar appearance. Mild cardiomegaly.   Original Report Authenticated By: Tonia Ghent, M.D.     Microbiology: No results found for this or any previous visit (from the past 240 hour(s)).   Labs: Basic Metabolic Panel: No results found for this basename: NA:5,K:5,CL:5,CO2:5,GLUCOSE:5,BUN:5,CREATININE:5,CALCIUM:5,MG:5,PHOS:5 in the last 168 hours Liver Function Tests: No results found for this basename: AST:5,ALT:5,ALKPHOS:5,BILITOT:5,PROT:5,ALBUMIN:5 in the last 168 hours No results found for this basename: LIPASE:5,AMYLASE:5 in the last 168 hours No results found for this basename: AMMONIA:5 in the last 168 hours CBC: No results found for this basename: WBC:5,NEUTROABS:5,HGB:5,HCT:5,MCV:5,PLT:5 in the last 168 hours Cardiac Enzymes: No results found for this basename: CKTOTAL:5,CKMB:5,CKMBINDEX:5,TROPONINI:5 in the last 168 hours BNP: BNP (last 3 results)  Basename 06/02/12 0148 04/15/12 2000 04/06/12 1957  PROBNP 1173.0* 1866.0* 1727.0*   CBG: No results found for this basename: GLUCAP:5 in the last 168 hours     Signed:  Ramonia Mcclaran  [facility] 06/15/2012, 9:54 PM

## 2012-06-16 ENCOUNTER — Emergency Department (HOSPITAL_COMMUNITY): Payer: Medicare Other

## 2012-06-16 ENCOUNTER — Encounter (HOSPITAL_COMMUNITY): Payer: Self-pay | Admitting: Emergency Medicine

## 2012-06-16 ENCOUNTER — Inpatient Hospital Stay (HOSPITAL_COMMUNITY): Payer: Medicare Other

## 2012-06-16 ENCOUNTER — Inpatient Hospital Stay (HOSPITAL_COMMUNITY)
Admission: EM | Admit: 2012-06-16 | Discharge: 2012-06-22 | DRG: 208 | Disposition: A | Discharge status: Home / Self Care | Payer: Medicare Other | Attending: Internal Medicine | Admitting: Internal Medicine

## 2012-06-16 DIAGNOSIS — I5032 Chronic diastolic (congestive) heart failure: Secondary | ICD-10-CM

## 2012-06-16 DIAGNOSIS — D509 Iron deficiency anemia, unspecified: Secondary | ICD-10-CM | POA: Diagnosis present

## 2012-06-16 DIAGNOSIS — J811 Chronic pulmonary edema: Secondary | ICD-10-CM

## 2012-06-16 DIAGNOSIS — A0472 Enterocolitis due to Clostridium difficile, not specified as recurrent: Secondary | ICD-10-CM

## 2012-06-16 DIAGNOSIS — I5033 Acute on chronic diastolic (congestive) heart failure: Secondary | ICD-10-CM

## 2012-06-16 DIAGNOSIS — N184 Chronic kidney disease, stage 4 (severe): Secondary | ICD-10-CM | POA: Diagnosis present

## 2012-06-16 DIAGNOSIS — J9690 Respiratory failure, unspecified, unspecified whether with hypoxia or hypercapnia: Secondary | ICD-10-CM

## 2012-06-16 DIAGNOSIS — I498 Other specified cardiac arrhythmias: Secondary | ICD-10-CM | POA: Diagnosis present

## 2012-06-16 DIAGNOSIS — I252 Old myocardial infarction: Secondary | ICD-10-CM

## 2012-06-16 DIAGNOSIS — E872 Acidosis, unspecified: Secondary | ICD-10-CM

## 2012-06-16 DIAGNOSIS — I4891 Unspecified atrial fibrillation: Secondary | ICD-10-CM

## 2012-06-16 DIAGNOSIS — J189 Pneumonia, unspecified organism: Secondary | ICD-10-CM

## 2012-06-16 DIAGNOSIS — F17200 Nicotine dependence, unspecified, uncomplicated: Secondary | ICD-10-CM

## 2012-06-16 DIAGNOSIS — J9601 Acute respiratory failure with hypoxia: Secondary | ICD-10-CM

## 2012-06-16 DIAGNOSIS — Z7901 Long term (current) use of anticoagulants: Secondary | ICD-10-CM

## 2012-06-16 DIAGNOSIS — N185 Chronic kidney disease, stage 5: Secondary | ICD-10-CM

## 2012-06-16 DIAGNOSIS — D72829 Elevated white blood cell count, unspecified: Secondary | ICD-10-CM | POA: Diagnosis present

## 2012-06-16 DIAGNOSIS — N189 Chronic kidney disease, unspecified: Secondary | ICD-10-CM

## 2012-06-16 DIAGNOSIS — I129 Hypertensive chronic kidney disease with stage 1 through stage 4 chronic kidney disease, or unspecified chronic kidney disease: Secondary | ICD-10-CM | POA: Diagnosis present

## 2012-06-16 DIAGNOSIS — R0602 Shortness of breath: Secondary | ICD-10-CM

## 2012-06-16 DIAGNOSIS — E876 Hypokalemia: Secondary | ICD-10-CM

## 2012-06-16 DIAGNOSIS — I509 Heart failure, unspecified: Secondary | ICD-10-CM

## 2012-06-16 DIAGNOSIS — J810 Acute pulmonary edema: Secondary | ICD-10-CM

## 2012-06-16 DIAGNOSIS — R739 Hyperglycemia, unspecified: Secondary | ICD-10-CM

## 2012-06-16 DIAGNOSIS — R5383 Other fatigue: Secondary | ICD-10-CM

## 2012-06-16 DIAGNOSIS — Z0000 Encounter for general adult medical examination without abnormal findings: Secondary | ICD-10-CM

## 2012-06-16 DIAGNOSIS — I2510 Atherosclerotic heart disease of native coronary artery without angina pectoris: Secondary | ICD-10-CM

## 2012-06-16 DIAGNOSIS — J9600 Acute respiratory failure, unspecified whether with hypoxia or hypercapnia: Secondary | ICD-10-CM

## 2012-06-16 DIAGNOSIS — R5381 Other malaise: Secondary | ICD-10-CM

## 2012-06-16 DIAGNOSIS — I161 Hypertensive emergency: Secondary | ICD-10-CM

## 2012-06-16 DIAGNOSIS — E785 Hyperlipidemia, unspecified: Secondary | ICD-10-CM

## 2012-06-16 DIAGNOSIS — E119 Type 2 diabetes mellitus without complications: Secondary | ICD-10-CM

## 2012-06-16 DIAGNOSIS — N289 Disorder of kidney and ureter, unspecified: Secondary | ICD-10-CM

## 2012-06-16 DIAGNOSIS — I10 Essential (primary) hypertension: Secondary | ICD-10-CM

## 2012-06-16 DIAGNOSIS — I428 Other cardiomyopathies: Secondary | ICD-10-CM

## 2012-06-16 LAB — MRSA PCR SCREENING: MRSA by PCR: NEGATIVE

## 2012-06-16 LAB — URINALYSIS, ROUTINE W REFLEX MICROSCOPIC
Bilirubin Urine: NEGATIVE
Glucose, UA: NEGATIVE mg/dL
Glucose, UA: 250 mg/dL — AB
Hgb urine dipstick: NEGATIVE
Ketones, ur: NEGATIVE mg/dL
Leukocytes, UA: NEGATIVE
Protein, ur: NEGATIVE mg/dL
Specific Gravity, Urine: 1.014 (ref 1.005–1.030)
Urobilinogen, UA: 0.2 mg/dL (ref 0.0–1.0)
pH: 6.5 (ref 5.0–8.0)

## 2012-06-16 LAB — POCT I-STAT 3, ART BLOOD GAS (G3+)
Acid-base deficit: 9 mmol/L — ABNORMAL HIGH (ref 0.0–2.0)
pCO2 arterial: 58.7 mmHg (ref 35.0–45.0)
pH, Arterial: 7.155 — CL (ref 7.350–7.450)
pO2, Arterial: 105 mmHg — ABNORMAL HIGH (ref 80.0–100.0)

## 2012-06-16 LAB — CBC WITH DIFFERENTIAL/PLATELET
Basophils Absolute: 0.1 10*3/uL (ref 0.0–0.1)
Basophils Relative: 0 % (ref 0–1)
Eosinophils Relative: 2 % (ref 0–5)
HCT: 37.2 % (ref 36.0–46.0)
Lymphocytes Relative: 33 % (ref 12–46)
MCHC: 31.5 g/dL (ref 30.0–36.0)
MCV: 84.2 fL (ref 78.0–100.0)
Monocytes Absolute: 0.8 10*3/uL (ref 0.1–1.0)
Neutro Abs: 8.8 10*3/uL — ABNORMAL HIGH (ref 1.7–7.7)
Platelets: 352 10*3/uL (ref 150–400)
RDW: 17.9 % — ABNORMAL HIGH (ref 11.5–15.5)
WBC: 14.7 10*3/uL — ABNORMAL HIGH (ref 4.0–10.5)

## 2012-06-16 LAB — LACTIC ACID, PLASMA: Lactic Acid, Venous: 4.6 mmol/L — ABNORMAL HIGH (ref 0.5–2.2)

## 2012-06-16 LAB — COMPREHENSIVE METABOLIC PANEL
ALT: 55 U/L — ABNORMAL HIGH (ref 0–35)
AST: 117 U/L — ABNORMAL HIGH (ref 0–37)
Albumin: 3.7 g/dL (ref 3.5–5.2)
Calcium: 11 mg/dL — ABNORMAL HIGH (ref 8.4–10.5)
Creatinine, Ser: 2.27 mg/dL — ABNORMAL HIGH (ref 0.50–1.10)
Sodium: 136 mEq/L (ref 135–145)

## 2012-06-16 LAB — BLOOD GAS, ARTERIAL
Bicarbonate: 18.5 mEq/L — ABNORMAL LOW (ref 20.0–24.0)
MECHVT: 550 mL
PEEP: 12 cmH2O
Patient temperature: 98.6
pCO2 arterial: 42.1 mmHg (ref 35.0–45.0)
pH, Arterial: 7.266 — ABNORMAL LOW (ref 7.350–7.450)

## 2012-06-16 LAB — PROTIME-INR
INR: 3.37 — ABNORMAL HIGH (ref 0.00–1.49)
Prothrombin Time: 32.2 seconds — ABNORMAL HIGH (ref 11.6–15.2)

## 2012-06-16 LAB — TROPONIN I: Troponin I: <0.3 ng/mL (ref <0.30)

## 2012-06-16 LAB — URINE MICROSCOPIC-ADD ON

## 2012-06-16 MED ORDER — NOREPINEPHRINE BITARTRATE 1 MG/ML IJ SOLN
INTRAMUSCULAR | Status: AC
  Filled 2012-06-16: qty 4

## 2012-06-16 MED ORDER — INSULIN ASPART 100 UNIT/ML ~~LOC~~ SOLN
2.0000 [IU] | SUBCUTANEOUS | Status: DC
  Administered 2012-06-17: 6 [IU] via SUBCUTANEOUS

## 2012-06-16 MED ORDER — FENTANYL CITRATE 0.05 MG/ML IJ SOLN
100.0000 ug | Freq: Once | INTRAMUSCULAR | Status: AC
  Administered 2012-06-16: 100 ug via INTRAVENOUS

## 2012-06-16 MED ORDER — FUROSEMIDE 10 MG/ML IJ SOLN
INTRAMUSCULAR | Status: AC
  Administered 2012-06-16: 40 mg via INTRAVENOUS
  Filled 2012-06-16: qty 4

## 2012-06-16 MED ORDER — ALBUTEROL (5 MG/ML) CONTINUOUS INHALATION SOLN
20.0000 mg | INHALATION_SOLUTION | Freq: Once | RESPIRATORY_TRACT | Status: AC
  Administered 2012-06-16: 20 mg via RESPIRATORY_TRACT
  Filled 2012-06-16: qty 20

## 2012-06-16 MED ORDER — VANCOMYCIN HCL IN DEXTROSE 1-5 GM/200ML-% IV SOLN: 1000.0000 mg | INTRAVENOUS | Status: DC

## 2012-06-16 MED ORDER — NITROGLYCERIN IN D5W 200-5 MCG/ML-% IV SOLN: 2.0000 ug/min | INTRAVENOUS | Status: DC

## 2012-06-16 MED ORDER — VANCOMYCIN HCL 1000 MG IV SOLR
750.0000 mg | INTRAVENOUS | Status: DC
  Filled 2012-06-16: qty 750

## 2012-06-16 MED ORDER — DEXTROSE 5 % IV SOLN
2.0000 ug/min | INTRAVENOUS | Status: DC
  Administered 2012-06-16: 10 ug/min via INTRAVENOUS
  Filled 2012-06-16: qty 8

## 2012-06-16 MED ORDER — SUCCINYLCHOLINE CHLORIDE 20 MG/ML IJ SOLN
INTRAMUSCULAR | Status: AC | PRN
  Administered 2012-06-16: 100 mg via INTRAVENOUS
  Administered 2012-06-16: 20 mg via INTRAVENOUS

## 2012-06-16 MED ORDER — FENTANYL CITRATE 0.05 MG/ML IJ SOLN
INTRAMUSCULAR | Status: AC
  Filled 2012-06-16: qty 2

## 2012-06-16 MED ORDER — MIDAZOLAM BOLUS VIA INFUSION
1.0000 mg | INTRAVENOUS | Status: DC | PRN
  Filled 2012-06-16: qty 2

## 2012-06-16 MED ORDER — ATROPINE SULFATE 0.1 MG/ML IJ SOLN
1.0000 mg | Freq: Once | INTRAMUSCULAR | Status: AC
  Administered 2012-06-16: 1 mg via INTRAVENOUS

## 2012-06-16 MED ORDER — METHYLPREDNISOLONE SODIUM SUCC 125 MG IJ SOLR
125.0000 mg | Freq: Once | INTRAMUSCULAR | Status: AC
  Administered 2012-06-16: 125 mg via INTRAVENOUS

## 2012-06-16 MED ORDER — PANTOPRAZOLE SODIUM 40 MG IV SOLR
40.0000 mg | Freq: Every day | INTRAVENOUS | Status: DC
  Administered 2012-06-17 (×2): 40 mg via INTRAVENOUS
  Filled 2012-06-16 (×3): qty 40

## 2012-06-16 MED ORDER — MIDAZOLAM HCL 2 MG/2ML IJ SOLN
4.0000 mg | Freq: Once | INTRAMUSCULAR | Status: AC
  Administered 2012-06-16: 4 mg via INTRAVENOUS
  Filled 2012-06-16: qty 4

## 2012-06-16 MED ORDER — WARFARIN - PHARMACIST DOSING INPATIENT: Freq: Every day | Status: DC

## 2012-06-16 MED ORDER — ALBUTEROL SULFATE HFA 108 (90 BASE) MCG/ACT IN AERS: 4.0000 | INHALATION_SPRAY | RESPIRATORY_TRACT | Status: DC | PRN

## 2012-06-16 MED ORDER — SODIUM CHLORIDE 0.9 % IV SOLN
1.0000 mg/h | INTRAVENOUS | Status: DC
  Filled 2012-06-16: qty 10

## 2012-06-16 MED ORDER — METHYLPREDNISOLONE SODIUM SUCC 125 MG IJ SOLR
INTRAMUSCULAR | Status: AC
  Administered 2012-06-16: 125 mg via INTRAVENOUS
  Filled 2012-06-16: qty 2

## 2012-06-16 MED ORDER — SODIUM CHLORIDE 0.9 % IV SOLN
25.0000 ug/h | INTRAVENOUS | Status: DC
  Administered 2012-06-16: 25 ug/h via INTRAVENOUS
  Filled 2012-06-16: qty 50

## 2012-06-16 MED ORDER — MIDAZOLAM HCL 5 MG/ML IJ SOLN
1.0000 mg/h | INTRAMUSCULAR | Status: DC
  Administered 2012-06-16: 4 mg/h via INTRAVENOUS
  Filled 2012-06-16: qty 10

## 2012-06-16 MED ORDER — NITROGLYCERIN IN D5W 200-5 MCG/ML-% IV SOLN
INTRAVENOUS | Status: AC
  Filled 2012-06-16: qty 250

## 2012-06-16 MED ORDER — MIDAZOLAM HCL 2 MG/2ML IJ SOLN
INTRAMUSCULAR | Status: AC
  Filled 2012-06-16: qty 4

## 2012-06-16 MED ORDER — FUROSEMIDE 10 MG/ML IJ SOLN
40.0000 mg | Freq: Once | INTRAMUSCULAR | Status: AC
  Administered 2012-06-16: 40 mg via INTRAVENOUS

## 2012-06-16 MED ORDER — SODIUM CHLORIDE 0.9 % IV SOLN
250.0000 mL | INTRAVENOUS | Status: DC | PRN
  Administered 2012-06-20: 500 mL via INTRAVENOUS

## 2012-06-16 MED ORDER — MIDAZOLAM HCL 2 MG/2ML IJ SOLN
4.0000 mg | Freq: Once | INTRAMUSCULAR | Status: AC
  Administered 2012-06-16: 4 mg via INTRAVENOUS

## 2012-06-16 MED ORDER — FENTANYL BOLUS VIA INFUSION
25.0000 ug | Freq: Four times a day (QID) | INTRAVENOUS | Status: DC | PRN
  Filled 2012-06-16: qty 100

## 2012-06-16 MED ORDER — VANCOMYCIN HCL 10 G IV SOLR
2000.0000 mg | Freq: Once | INTRAVENOUS | Status: DC
  Filled 2012-06-16: qty 2000

## 2012-06-16 MED ORDER — VANCOMYCIN HCL 10 G IV SOLR: 2.0000 g | Freq: Once | INTRAVENOUS | Status: DC

## 2012-06-16 MED ORDER — ETOMIDATE 2 MG/ML IV SOLN
INTRAVENOUS | Status: AC | PRN
  Administered 2012-06-16 (×2): 20 mg via INTRAVENOUS

## 2012-06-16 MED ORDER — IPRATROPIUM BROMIDE 0.02 % IN SOLN
0.5000 mg | Freq: Once | RESPIRATORY_TRACT | Status: AC
  Administered 2012-06-16: 0.5 mg via RESPIRATORY_TRACT
  Filled 2012-06-16: qty 2.5

## 2012-06-16 MED ORDER — SODIUM CHLORIDE 0.9 % IV SOLN
10.0000 ug/h | INTRAVENOUS | Status: DC
  Filled 2012-06-16: qty 50

## 2012-06-16 MED ORDER — VANCOMYCIN HCL 10 G IV SOLR
1500.0000 mg | Freq: Once | INTRAVENOUS | Status: AC
  Administered 2012-06-16 (×2): 1500 mg via INTRAVENOUS
  Filled 2012-06-16: qty 1500

## 2012-06-16 MED ORDER — DEXTROSE 5 % IV SOLN
INTRAVENOUS | Status: AC
  Filled 2012-06-16: qty 250

## 2012-06-16 MED ORDER — SODIUM CHLORIDE 0.9 % IV SOLN
1.0000 mg/h | INTRAVENOUS | Status: DC
  Administered 2012-06-16: 1 mg/h via INTRAVENOUS
  Filled 2012-06-16: qty 10

## 2012-06-16 MED ORDER — PIPERACILLIN-TAZOBACTAM 3.375 G IVPB: 3.3750 g | Freq: Once | INTRAVENOUS | Status: DC

## 2012-06-16 NOTE — ED Notes (Signed)
Levophed gtt started at 24mcg/min in right EJ

## 2012-06-16 NOTE — ED Notes (Signed)
Levophed gtt now infusing in [employer] per Dr. Kathryne Eriksson.  Pt placed back on vent with 02 sats 94%.

## 2012-06-16 NOTE — ED Notes (Signed)
Port. Chest x-ray in room

## 2012-06-16 NOTE — ED Notes (Signed)
Peep increased to 7  Title volume increased to 550  Per Dr. Silverio Lay.  Pt 02 sats 83%.  Pt being bagged by resp at this time .

## 2012-06-16 NOTE — H&P (Signed)
PULMONARY  / CRITICAL CARE MEDICINE  Name: [name] MRN: [id number] DOB: [birth year]    ADMISSION DATE:  06/16/2012 CONSULTATION DATE:  06/16/2012  REFERRING MD :  ED  CHIEF COMPLAINT:  Shortness of breath.  BRIEF PATIENT DESCRIPTION: 72 Y/O F with hx of diastolic CHF, Hypertrophic cardiomyopathy, CAD, Afib on Coumadin, CKD and DMT2 brought to ED by EMS after sudden onset of SOB while shopping. On ED was found to have O2 sat on the 50's not responding to Bipap. Was intubated and subsequently developed hypotension and bradycardia.   SIGNIFICANT EVENTS / STUDIES:  2/7 Intubated   LINES / TUBES: 2/7 Peripheral Right EJ 2/7 Peripheral Right forearm 2/7 CVC   CULTURES: 2/7 Blood Cultures X2   ANTIBIOTICS: 2/7Vancomycin 2/7 Zosyn   HISTORY OF PRESENT ILLNESS: 2 daughters were present and they provided most of the history. They report that pt has been having episodes of SOB with hospitalizations for the past year. Last admission was 06/02/12. Today she was feeling on her normal state of health, went to the grocery store and while shopping asked to go to the restroom suddenly developing SOB. EMS was activated and pt was brought to ED. On ED O2 sats were on the 50's and pt continued on respiratory distress failing Bipap. Intubation and mechanical ventilation followed. CXR showed pulmonary edema and  furosemide 40 mg IV was given. After procedure pt developed hypotension and bradycardia. A bolus of 1 L NS was given without response, started on pressors.  Admission to ICU.  PAST MEDICAL HISTORY :  Past Medical History  Diagnosis Date  . CAD (coronary artery disease)     cath 11/11: LAD 40%, mid-dist 25-30%; prox CFX 30%, mid 40%; OM 50%; PDA 50%; PLV 50%;  EF 65%  . Acute myocardial infarction     in setting of AFib with RVR 03/2010  . Atrial fibrillation     DCCV 11/17/11; coumadin & amiodarone  . HOCM (hypertrophic obstructive cardiomyopathy)     Echo 11/2011:severe LVH, EF 65-70%,  mid-cavity gradient up to  . Hypertension   . Tobacco abuse   . Chronic kidney disease     Stage 4  . CHF (congestive heart failure)     preserved LVF  . Iron deficiency anemia   . Third degree uterine prolapse   . Hx MRSA infection   . Hx of cardiovascular stress test     a. Lex MV 12/13:  EF 56%, no ischemia    . Diabetes    Past Surgical History  Procedure Date  . Tubal ligation   . Cardioversion 11/17/2011    Procedure: CARDIOVERSION;  Surgeon: Hillis Range, MD;  Location: [facility] OR;  Service: Cardiovascular;  Laterality: N/A;   Prior to Admission medications   Medication Sig Start Date End Date Taking? Authorizing Provider  acetaminophen (TYLENOL) 500 MG tablet Take 500 mg by mouth 2 (two) times daily as needed. For pain    Historical Provider, MD  amiodarone (PACERONE) 200 MG tablet Take 200 mg by mouth daily.  12/16/11 12/15/12  Jessica A Hope, PA-C  amLODipine (NORVASC) 5 MG tablet Take 1 tablet (5 mg total) by mouth daily. 03/23/12   Beatrice Lecher, PA  furosemide (LASIX) 20 MG tablet Take 1 tablet (20 mg total) by mouth daily. 06/05/12   Kathlen Mody, MD  insulin aspart (NOVOLOG) 100 UNIT/ML injection Inject 0-9 Units into the skin 3 (three) times daily with meals. 06/04/12   Kathlen Mody, MD  levofloxacin (  LEVAQUIN) 500 MG tablet Take 1 tablet (500 mg total) by mouth daily. 06/05/12   Kathlen Mody, MD  metoprolol tartrate (LOPRESSOR) 25 MG tablet Take 25 mg by mouth 2 (two) times daily. 11/18/11 11/17/12  Jessica A Hope, PA-C  nitroGLYCERIN (NITROSTAT) 0.4 MG SL tablet Place 0.4 mg under the tongue every 5 (five) minutes as needed. For chest pain    Historical Provider, MD  rosuvastatin (CRESTOR) 10 MG tablet Take 10 mg by mouth at bedtime.     Historical Provider, MD  warfarin (COUMADIN) 5 MG tablet Take 5 mg by mouth every evening. 1/2 tablet on Thursday (2.5 mg) 3 or 4 days 1/2 tablet, other days 1 tablet - written down on paper at home    Historical Provider, MD   No Known  Allergies  FAMILY HISTORY:  Family History  Problem Relation Age of Onset  . Coronary artery disease Neg Hx   . Atrial fibrillation Neg Hx    SOCIAL HISTORY:  reports that she quit smoking about 4 months ago. Her smoking use included Cigarettes. She has a 7.5 pack-year smoking history. She has never used smokeless tobacco. She reports that she does not drink alcohol or use illicit drugs.  REVIEW OF SYSTEMS:  Not obtained due to pt being intubated.  SUBJECTIVE:   VITAL SIGNS: Temp:  [99.7 F (37.6 C)] 99.7 F (37.6 C) (02/07 1801) Pulse Rate:  [85-108] 85  (02/07 1735) Resp:  [8-22] 8  (02/07 1801) BP: (57-175)/(32-103) 57/32 mmHg (02/07 1830) SpO2:  [38 %-94 %] 94 % (02/07 1853) FiO2 (%):  [38 %-100 %] 100 % (02/07 1853)  HEMODYNAMICS:    VENTILATOR SETTINGS: Vent Mode:  [-] PRVC FiO2 (%):  [38 %-100 %] 100 % Set Rate:  [22 bmp] 22 bmp Vt Set:  [500 mL-550 mL] 550 mL PEEP:  [5 cmH20-10 cmH20] 10 cmH20 Plateau Pressure:  [28 cmH20] 28 cmH20  INTAKE / OUTPUT: Intake/Output    None    PHYSICAL EXAMINATION: General: sedated intubated Neuro: as above Gen:  NAD HEENT: Moist mucous membranes  CV: Regular rate and rhythm, no murmurs rubs or gallops PULM: coarse breath sounds bilaterally. Rales present on both lung fields ABD: Obese, Soft, non distended, normal bowel sounds EXT: +1 pitting edema.   LABS:  Lab 06/16/12 1747 06/16/12 1739 06/16/12 1738  HGB -- -- 11.7*  WBC -- -- 14.7*  PLT -- -- 352  NA -- -- 136  K -- -- 3.9  CL -- -- 102  CO2 -- -- 18*  GLUCOSE -- -- 272*  BUN -- -- 17  CREATININE -- -- 2.27*  CALCIUM -- -- 11.0*  MG -- -- --  PHOS -- -- --  AST -- -- 117*  ALT -- -- 55*  ALKPHOS -- -- 214*  BILITOT -- -- 0.3  PROT -- -- 8.2  ALBUMIN -- -- 3.7  APTT -- -- --  INR -- -- --  LATICACIDVEN -- 4.6* --  TROPONINI -- <0.30 --  PROCALCITON -- -- --  PROBNP -- -- --  O2SATVEN -- -- --  PHART 7.155* -- --  PCO2ART 58.7* -- --  PO2ART  105.0* -- --   No results found for this basename: GLUCAP:5 in the last 168 hours  CXR: 2/7 Diffuse airspace disease most consistent with edema. Cannot exclude pneumonia.  ASSESSMENT / PLAN:  PULMONARY A: Pulmonary edema, PNA not excluded  Respiratory failure P:   - Intubation and mechanical ventilation to keep RASS -2  -  PRVC, Vt: 8cckg, PEEP: 12, RR: 22, FiO2 100%. Goal O2 sat >92%  CARDIOVASCULAR A:  CHF exacerbation last ECHO was 11/16/11 EF 65-70% and diastolic function was indeterminate.  Hx of Hypertrophic Cardiomyopathy Hx CAD Hx of Afib on coumadin Hx HLD P:  - Cycle troponin I - ECHO tomorrow to assess EF.  - Pt on pressors now. Levophed  - Coumadin per pharmacy.  RENAL A:   Hx CKD cr at baseline.  Lactic acidosis most likely secondary to tissue hypoperfusion. P:   - Continue monitoring Cr and lytes - Will diurese when more stable and off levophed.  GASTROINTESTINAL A:  No issues P:   NPO  HEMATOLOGIC A: Anemia of Chronic Disease. Hb stable P:  - Trend CBC  INFECTIOUS A:  Not excluded PNA, pt recently hospitalized. ?Sepsis.    - Vanc and Zosyn - F/u Cx  ENDOCRINE A:   Hx DM P:   - SSI  NEUROLOGIC A:  No issues P:   - Continuous sedation with versed  Signed D. [place]oto Rolene Arbour, MD Family Medicine  PGY-2  06/16/2012, 7:00 PM   I have personally personally reviewed patient's available data, including medical history, events of note, physical examination and test results as part of my evaluation. I have discussed with resident whose note is outlined above and that I agree with.   The patient is critically ill with multiple organ systems failure and requires high complexity decision making for assessment and support, frequent evaluation and titration of therapies, application of advanced monitoring technologies and extensive interpretation of multiple databases.   Critical Care Time devoted to patient care services described in this note is:  90 minutes   Overton Mam, M.D.  Pulmonary and Critical Care Medicine  [facility]  Pager: [phone]

## 2012-06-16 NOTE — Progress Notes (Signed)
Patient vomited.

## 2012-06-16 NOTE — ED Notes (Signed)
Pt's dentures (top plate) and beaded bracelet given to daughters.

## 2012-06-16 NOTE — ED Provider Notes (Signed)
History     CSN: [id number]  Arrival date & time 06/16/12  1726   First MD Initiated Contact with Patient 06/16/12 1736      Chief Complaint  Patient presents with  . Respiratory Distress    (Consider location/radiation/quality/duration/timing/severity/associated sxs/prior treatment) Patient is a 72 y.o. female presenting with shortness of breath. The history is provided by the EMS personnel. The history is limited by the condition of the patient.  Shortness of Breath  The current episode started today. The onset was sudden. The problem occurs continuously. The problem has been rapidly worsening. The problem is severe. Nothing relieves the symptoms. Nothing aggravates the symptoms. Associated symptoms include shortness of breath and wheezing. Pertinent negatives include no fever. She has had no prior steroid use. Past medical history comments: CHF. She has been less responsive. Urine output has been normal. There were no sick contacts. Recently, medical care has been given at this facility.  Shortness of Breath Onset quality:  Sudden Progression:  Rapidly worsening Relieved by:  Nothing Worsened by:  Nothing Associated symptoms: wheezing   Associated symptoms: no fever     Past Medical History  Diagnosis Date  . CAD (coronary artery disease)     cath 11/11: LAD 40%, mid-dist 25-30%; prox CFX 30%, mid 40%; OM 50%; PDA 50%; PLV 50%;  EF 65%  . Acute myocardial infarction     in setting of AFib with RVR 03/2010  . Atrial fibrillation     DCCV 11/17/11; coumadin & amiodarone  . HOCM (hypertrophic obstructive cardiomyopathy)     Echo 11/2011:severe LVH, EF 65-70%, mid-cavity gradient up to  . Hypertension   . Tobacco abuse   . Chronic kidney disease     Stage 4  . CHF (congestive heart failure)     preserved LVF  . Iron deficiency anemia   . Third degree uterine prolapse   . Hx MRSA infection   . Hx of cardiovascular stress test     a. Lex MV 12/13:  EF 56%, no ischemia     . Diabetes     Past Surgical History  Procedure Laterality Date  . Tubal ligation    . Cardioversion  11/17/2011    Procedure: CARDIOVERSION;  Surgeon: Hillis Range, MD;  Location: [facility] OR;  Service: Cardiovascular;  Laterality: N/A;    Family History  Problem Relation Age of Onset  . Coronary artery disease Neg Hx   . Atrial fibrillation Neg Hx     History  Substance Use Topics  . Smoking status: Former Smoker -- 0.25 packs/day for 30 years    Types: Cigarettes    Quit date: 02/02/2012  . Smokeless tobacco: Never Used     Comment: quit 01/2012  . Alcohol Use: No    OB History   Grav Para Term Preterm Abortions TAB SAB Ect Mult Living                  Review of Systems  Unable to perform ROS: Mental status change  Constitutional: Negative for fever.  Respiratory: Positive for shortness of breath and wheezing.     Allergies  Review of patient's allergies indicates no known allergies.  Home Medications   No current outpatient prescriptions on file.  BP 114/56  Pulse 71  Temp(Src) 98.1 F (36.7 C) (Core (Comment))  Resp 16  Ht 5' 2.99" (1.6 m)  Wt 190 lb 11.2 oz (86.5 kg)  BMI 33.79 kg/m2  SpO2 98%  Physical Exam  Constitutional:  She appears well-developed and well-nourished. She appears distressed.  HENT:  Head: Normocephalic and atraumatic.  Mouth/Throat: Oropharynx is clear and moist.  Eyes: EOM are normal. Pupils are equal, round, and reactive to light.  Neck: Normal range of motion. Neck supple.  Cardiovascular: Normal rate, regular rhythm, normal heart sounds and intact distal pulses.   Pulmonary/Chest: She is in respiratory distress. She has wheezes. She has no rales.  Abdominal: Soft. Bowel sounds are normal. She exhibits no distension. There is no tenderness. There is no rebound and no guarding.  Musculoskeletal: Normal range of motion. She exhibits no edema and no tenderness.  Lymphadenopathy:    She has no cervical adenopathy.  Neurological:  She is alert. She displays normal reflexes. No cranial nerve deficit. She exhibits normal muscle tone. Coordination normal. GCS eye subscore is 4. GCS verbal subscore is 3. GCS motor subscore is 6.  Skin: Skin is warm and dry. No rash noted.  Psychiatric: She has a normal mood and affect. Her behavior is normal.    ED Course  Angiocath insertion Performed by: Johnnette Gourd Authorized by: Richardean Canal Consent: The procedure was performed in an emergent situation. Preparation: Patient was prepped and draped in the usual sterile fashion. Local anesthesia used: no Patient sedated: no Patient tolerance: Patient tolerated the procedure well with no immediate complications. Comments: 18 gauge catheter placed in right EJ. Non-pulsatile flow. Saline flush without difficulty.   INTUBATION Performed by: Johnnette Gourd Authorized by: Richardean Canal Consent: The procedure was performed in an emergent situation. Indications: respiratory distress, hypoxemia and respiratory failure Intubation method: video-assisted Patient status: paralyzed (RSI) Preoxygenation: cpap. Sedatives: etomidate Paralytic: succinylcholine Tube size: 7.5 mm Tube type: cuffed Number of attempts: 1 Cricoid pressure: no Cords visualized: yes Post-procedure assessment: chest rise and ETCO2 monitor Breath sounds: equal Cuff inflated: yes   (including critical care time)  Labs Reviewed  CBC WITH DIFFERENTIAL - Abnormal; Notable for the following:    WBC 14.7 (*)    Hemoglobin 11.7 (*)    RDW 17.9 (*)    Neutro Abs 8.8 (*)    Lymphs Abs 4.8 (*)    All other components within normal limits  COMPREHENSIVE METABOLIC PANEL - Abnormal; Notable for the following:    CO2 18 (*)    Glucose, Bld 272 (*)    Creatinine, Ser 2.27 (*)    Calcium 11.0 (*)    AST 117 (*)    ALT 55 (*)    Alkaline Phosphatase 214 (*)    GFR calc non Af Amer 21 (*)    GFR calc Af Amer 24 (*)    All other components within normal limits  URINALYSIS,  ROUTINE W REFLEX MICROSCOPIC - Abnormal; Notable for the following:    Protein, ur 100 (*)    All other components within normal limits  LACTIC ACID, PLASMA - Abnormal; Notable for the following:    Lactic Acid, Venous 4.6 (*)    All other components within normal limits  URINE MICROSCOPIC-ADD ON - Abnormal; Notable for the following:    Casts HYALINE CASTS (*)    All other components within normal limits  BASIC METABOLIC PANEL - Abnormal; Notable for the following:    Potassium 2.5 (*)    CO2 18 (*)    Glucose, Bld 408 (*)    Creatinine, Ser 2.23 (*)    Calcium 10.6 (*)    GFR calc non Af Amer 21 (*)    GFR calc Af Amer 24 (*)  All other components within normal limits  BLOOD GAS, ARTERIAL - Abnormal; Notable for the following:    pH, Arterial 7.294 (*)    pCO2 arterial 34.8 (*)    pO2, Arterial 202.0 (*)    Bicarbonate 16.5 (*)    Acid-base deficit 8.9 (*)    All other components within normal limits  CBC - Abnormal; Notable for the following:    WBC 16.4 (*)    RBC 3.53 (*)    Hemoglobin 9.2 (*)    HCT 28.9 (*)    RDW 17.8 (*)    All other components within normal limits  LACTIC ACID, PLASMA - Abnormal; Notable for the following:    Lactic Acid, Venous 6.1 (*)    All other components within normal limits  PROTIME-INR - Abnormal; Notable for the following:    Prothrombin Time 32.2 (*)    INR 3.37 (*)    All other components within normal limits  PROTIME-INR - Abnormal; Notable for the following:    Prothrombin Time 32.3 (*)    INR 3.38 (*)    All other components within normal limits  URINALYSIS, ROUTINE W REFLEX MICROSCOPIC - Abnormal; Notable for the following:    Glucose, UA 250 (*)    All other components within normal limits  GLUCOSE, CAPILLARY - Abnormal; Notable for the following:    Glucose-Capillary 221 (*)    All other components within normal limits  BLOOD GAS, ARTERIAL - Abnormal; Notable for the following:    pH, Arterial 7.266 (*)    pO2, Arterial  219.0 (*)    Bicarbonate 18.5 (*)    Acid-base deficit 7.2 (*)    All other components within normal limits  GLUCOSE, CAPILLARY - Abnormal; Notable for the following:    Glucose-Capillary 300 (*)    All other components within normal limits  GLUCOSE, CAPILLARY - Abnormal; Notable for the following:    Glucose-Capillary 355 (*)    All other components within normal limits  GLUCOSE, CAPILLARY - Abnormal; Notable for the following:    Glucose-Capillary 297 (*)    All other components within normal limits  GLUCOSE, CAPILLARY - Abnormal; Notable for the following:    Glucose-Capillary 218 (*)    All other components within normal limits  POCT I-STAT 3, BLOOD GAS (G3+) - Abnormal; Notable for the following:    pH, Arterial 7.155 (*)    pCO2 arterial 58.7 (*)    pO2, Arterial 105.0 (*)    Acid-base deficit 9.0 (*)    All other components within normal limits  MRSA PCR SCREENING  CULTURE, BLOOD (ROUTINE X 2)  CULTURE, BLOOD (ROUTINE X 2)  URINE CULTURE  URINE CULTURE  TROPONIN I  STREP PNEUMONIAE URINARY ANTIGEN  LEGIONELLA ANTIGEN, URINE  MAGNESIUM  PHOSPHORUS  TROPONIN I  TROPONIN I  TROPONIN I  BLOOD GAS, ARTERIAL   Dg Chest Port 1 View  06/17/2012  *RADIOLOGY REPORT*  Clinical Data: 72 year old female intubated.  PORTABLE CHEST - 1 VIEW  Comparison: 06/16/2012 and earlier.  Findings: Semi upright AP portable view 1024 hours.  Stable endotracheal tube tip in good position between level clavicles and carina.  Enteric tube now placed, side hole in the distal esophagus.  Stable left subclavian central line.   Stable cardiac size and mediastinal contours.  Decreased interstitial and perihilar opacity with mild to moderate residual. No pneumothorax.  No large effusion or consolidation.  IMPRESSION: 1.  Enteric tube placed, side hole the distal esophagus.  Advanced 8 cm  for more optimal placement. 2. Otherwise, stable lines and tubes. 3.  Decreased pulmonary edema.   Original Report  Authenticated By: Erskine Speed, M.D.    Dg Chest Port 1 View  06/16/2012  *RADIOLOGY REPORT*  Clinical Data: Line placement  PORTABLE CHEST - 1 VIEW  Comparison: Plain film 06/16/2012 at 17 56  Findings: Interval placement of left central venous line with tip in the distal SVC.  Endotracheal tube unchanged.  Bilateral airspace disease is unchanged.  No pneumothorax.  IMPRESSION: Placement of left central venous line with tip in distal SVC.   Original Report Authenticated By: Genevive Bi, M.D.    Dg Chest Port 1 View  06/16/2012  *RADIOLOGY REPORT*  Clinical Data: Respiratory failure, endotracheal tube placed  PORTABLE CHEST - 1 VIEW  Comparison: Chest x-ray of 06/03/2012  Findings: The tip of the endotracheal is approximately 4.5 cm above the carina.  There is diffuse airspace disease now present most consistent with edema.  Pneumonia cannot be excluded.  No definite effusion is seen.  Cardiomegaly is stable.  IMPRESSION:  1.  Endotracheal tip 4.5 cm above the carina. 2.  Diffuse airspace disease most consistent with edema.  Cannot exclude pneumonia.   Original Report Authenticated By: Dwyane Dee, M.D.     Date: 06/17/2012  Rate: 91  Rhythm: normal sinus rhythm  QRS Axis: normal  Intervals: QRS wide  ST/T Wave abnormalities: nonspecific ST changes and nonspecific T wave changes  Conduction Disutrbances:left bundle branch block  Narrative Interpretation:   Old EKG Reviewed: unchanged    1. Pulmonary edema   2. Respiratory failure   3. Healthcare-associated pneumonia   4. Acute on chronic diastolic congestive heart failure   5. Acute respiratory failure with hypoxia       MDM   66F presents in hypoxic respiratory failure with 02 sats in the 50's. She was intubated on arrival. See procedure note. sats improved on ventilator. Initially pt was hypertensive and CXR concerning for pulmonary edema. Given lasix and plan was to start on nitro gtt however BP dropped to 60's systolic and became  bradycardic. Pt started on levophed with improvement in BP. Felt to be in cardiogenic shock primarily although difficult to exclude infection. Pan-cultures obtained and started on broad spectrum antibiotics. Pt admitted to ICU for further management.        Johnnette Gourd, MD 06/17/12 [phone]

## 2012-06-16 NOTE — Progress Notes (Addendum)
ANTIBIOTIC CONSULT NOTE - INITIAL  Pharmacy Consult for vancomycin Indication: pneumonia  Pharmacy Consult for warfarin Indication: afib   No Known Allergies  Patient Measurements: Height: 5' 2.99" (160 cm) Weight: 176 lb 5.9 oz (80 kg) IBW/kg (Calculated) : 52.38  Adjusted Body Weight: \  Vital Signs: Temp: 99.7 F (37.6 C) (02/07 1801) Temp src: Rectal (02/07 1801) BP: 136/84 mmHg (02/07 1932) Pulse Rate: 97  (02/07 1932) Intake/Output from previous day:   Intake/Output from this shift:    Labs:  Basename 06/16/12 1738  WBC 14.7*  HGB 11.7*  PLT 352  LABCREA --  CREATININE 2.27*   Estimated Creatinine Clearance: 22.8 ml/min (by C-G formula based on Cr of 2.27). No results found for this basename: VANCOTROUGH:2,VANCOPEAK:2,VANCORANDOM:2,GENTTROUGH:2,GENTPEAK:2,GENTRANDOM:2,TOBRATROUGH:2,TOBRAPEAK:2,TOBRARND:2,AMIKACINPEAK:2,AMIKACINTROU:2,AMIKACIN:2, in the last 72 hours   Microbiology: Recent Results (from the past 720 hour(s))  MRSA PCR SCREENING     Status: Abnormal   Collection Time   06/02/12  5:37 AM      Component Value Range Status Comment   MRSA by PCR POSITIVE (*) NEGATIVE Final     Medical History: Past Medical History  Diagnosis Date  . CAD (coronary artery disease)     cath 11/11: LAD 40%, mid-dist 25-30%; prox CFX 30%, mid 40%; OM 50%; PDA 50%; PLV 50%;  EF 65%  . Acute myocardial infarction     in setting of AFib with RVR 03/2010  . Atrial fibrillation     DCCV 11/17/11; coumadin & amiodarone  . HOCM (hypertrophic obstructive cardiomyopathy)     Echo 11/2011:severe LVH, EF 65-70%, mid-cavity gradient up to  . Hypertension   . Tobacco abuse   . Chronic kidney disease     Stage 4  . CHF (congestive heart failure)     preserved LVF  . Iron deficiency anemia   . Third degree uterine prolapse   . Hx MRSA infection   . Hx of cardiovascular stress test     a. Lex MV 12/13:  EF 56%, no ischemia    . Diabetes     Assessment: 72  year old female presents to [facility] with shortness of breath requiring urgent intubation. Orders to start IV vancomycin for suspected pneumonia.  Tmax 99.7, wbc elevated at 14, scr elevated at 2.2. Will dose vancomycin accordingly.  Patient with history of afib on chronic coumadin. No baseline INR - will order and redose warfarin as needed, could consider heparin if procedures are possibly planned.  Goal of Therapy:  Vancomycin trough level 15-20 mcg/ml INR goal 2-3  Plan:  Measure antibiotic drug levels at steady state Follow up culture results Vancomycin 1g IV q 24 hours-1st dose done in ED. INR now Severiano Gilbert 06/16/2012,7:47 PM   Addendum: INR is supratherapeutic at 3.37. Unsure if patient had dose tonight. No bleeding noted, CBC normal.  -No Coumadin tonight -INR daily -Monitor for s/sx bleeding  [facility] [address].D., BCPS Clinical Pharmacist Pager: [phone] 06/16/2012 10:09 PM

## 2012-06-16 NOTE — ED Notes (Signed)
IV NS bolus infusing at this time in right EJ.

## 2012-06-16 NOTE — ED Notes (Signed)
Placed foley temp into patient yellow urine return

## 2012-06-16 NOTE — Progress Notes (Signed)
Patient desat to 71% on 100% FIO2. Patient was removed from vent and bagged with >15 lpm O2 and PEEP valve set to 10 cmH2O. Pt was given a vasopressor and O2 sats returned to mid 90's. PEEP increased to 12 per MD Siqueros.

## 2012-06-16 NOTE — ED Notes (Signed)
Versed increased to 4mg /hr.  Levophed decreased to 34mcg/min.  A line place in rt femoral

## 2012-06-16 NOTE — ED Notes (Signed)
Pt O2 Sats 76%.  Resp. Bagging pt at this time.  Levophed started at 36mcg/min in right forearm.  Pt continues to be bagged.

## 2012-06-16 NOTE — ED Notes (Signed)
Upper dentures removed and placed in denture cup with name label on cup and lid.

## 2012-06-16 NOTE — Progress Notes (Signed)
eLink Physician-Brief Progress Note Patient Name: DILPREET FAIRES DOB: 1940/09/20 MRN: [id number]  Date of Service  06/16/2012   HPI/Events of Note     eICU Interventions  Adjustments made on continuous sedation orders while on vent.   Intervention Category Minor Interventions: Routine modifications to care plan (e.g. PRN medications for pain, fever)  Mia Winthrop 06/16/2012, 11:29 PM

## 2012-06-16 NOTE — ED Notes (Signed)
EMS reports pt was extremely short of breath after coming out of grocery store.  Pt was given breathing tx without relief then placed on c-pap. On arrival pt unresponsive to verbal or painful stimuli.  MD at bedside preparing to intubate.  Breath sounds demished in all Radie Berges.

## 2012-06-16 NOTE — ED Notes (Signed)
Levophed d'cd per Dr. Kathryne Eriksson.   Central Line being placed at this time.

## 2012-06-16 NOTE — ED Notes (Signed)
Levophed gtt up to 51mcg/min

## 2012-06-17 ENCOUNTER — Encounter (HOSPITAL_COMMUNITY): Payer: Self-pay | Admitting: *Deleted

## 2012-06-17 ENCOUNTER — Inpatient Hospital Stay (HOSPITAL_COMMUNITY): Payer: Medicare Other

## 2012-06-17 DIAGNOSIS — J811 Chronic pulmonary edema: Secondary | ICD-10-CM

## 2012-06-17 DIAGNOSIS — J9600 Acute respiratory failure, unspecified whether with hypoxia or hypercapnia: Principal | ICD-10-CM

## 2012-06-17 DIAGNOSIS — I509 Heart failure, unspecified: Secondary | ICD-10-CM

## 2012-06-17 DIAGNOSIS — I5033 Acute on chronic diastolic (congestive) heart failure: Secondary | ICD-10-CM

## 2012-06-17 LAB — GLUCOSE, CAPILLARY
Glucose-Capillary: 132 mg/dL — ABNORMAL HIGH (ref 70–99)
Glucose-Capillary: 94 mg/dL (ref 70–99)

## 2012-06-17 LAB — BLOOD GAS, ARTERIAL
Drawn by: 35135
FIO2: 0.7 %
MECHVT: 550 mL
PEEP: 12 cmH2O
RATE: 22 resp/min
pCO2 arterial: 34.8 mmHg — ABNORMAL LOW (ref 35.0–45.0)
pH, Arterial: 7.294 — ABNORMAL LOW (ref 7.350–7.450)
pO2, Arterial: 202 mmHg — ABNORMAL HIGH (ref 80.0–100.0)

## 2012-06-17 LAB — LEGIONELLA ANTIGEN, URINE

## 2012-06-17 LAB — MAGNESIUM: Magnesium: 1.8 mg/dL (ref 1.5–2.5)

## 2012-06-17 LAB — BASIC METABOLIC PANEL
BUN: 22 mg/dL (ref 6–23)
CO2: 18 mEq/L — ABNORMAL LOW (ref 19–32)
Calcium: 10.6 mg/dL — ABNORMAL HIGH (ref 8.4–10.5)
Chloride: 100 mEq/L (ref 96–112)
Creatinine, Ser: 2.23 mg/dL — ABNORMAL HIGH (ref 0.50–1.10)
Glucose, Bld: 408 mg/dL — ABNORMAL HIGH (ref 70–99)

## 2012-06-17 LAB — CBC
Hemoglobin: 9.2 g/dL — ABNORMAL LOW (ref 12.0–15.0)
MCH: 26.1 pg (ref 26.0–34.0)
Platelets: 305 10*3/uL (ref 150–400)
RBC: 3.53 MIL/uL — ABNORMAL LOW (ref 3.87–5.11)
WBC: 16.4 10*3/uL — ABNORMAL HIGH (ref 4.0–10.5)

## 2012-06-17 LAB — TROPONIN I: Troponin I: <0.3 ng/mL (ref <0.30)

## 2012-06-17 LAB — PHOSPHORUS: Phosphorus: 2.3 mg/dL (ref 2.3–4.6)

## 2012-06-17 LAB — STREP PNEUMONIAE URINARY ANTIGEN: Strep Pneumo Urinary Antigen: NEGATIVE

## 2012-06-17 MED ORDER — INSULIN ASPART 100 UNIT/ML ~~LOC~~ SOLN
0.0000 [IU] | Freq: Three times a day (TID) | SUBCUTANEOUS | Status: DC
  Administered 2012-06-17: 2 [IU] via SUBCUTANEOUS
  Administered 2012-06-17: 8 [IU] via SUBCUTANEOUS
  Administered 2012-06-17: 5 [IU] via SUBCUTANEOUS
  Administered 2012-06-18: 2 [IU] via SUBCUTANEOUS

## 2012-06-17 MED ORDER — POTASSIUM CHLORIDE 10 MEQ/50ML IV SOLN
10.0000 meq | INTRAVENOUS | Status: AC
  Administered 2012-06-17 (×3): 10 meq via INTRAVENOUS

## 2012-06-17 MED ORDER — BIOTENE DRY MOUTH MT LIQD
15.0000 mL | Freq: Four times a day (QID) | OROMUCOSAL | Status: DC
  Administered 2012-06-17 – 2012-06-19 (×11): 15 mL via OROMUCOSAL

## 2012-06-17 MED ORDER — CHLORHEXIDINE GLUCONATE 0.12 % MT SOLN
15.0000 mL | Freq: Two times a day (BID) | OROMUCOSAL | Status: DC
  Administered 2012-06-17 – 2012-06-19 (×6): 15 mL via OROMUCOSAL
  Filled 2012-06-17 (×7): qty 15

## 2012-06-17 MED ORDER — WHITE PETROLATUM GEL
Status: AC
  Administered 2012-06-17: 0.2
  Filled 2012-06-17: qty 5

## 2012-06-17 MED ORDER — POTASSIUM CHLORIDE 10 MEQ/100ML IV SOLN: 10.0000 meq | INTRAVENOUS | Status: DC

## 2012-06-17 MED ORDER — POTASSIUM CHLORIDE 10 MEQ/50ML IV SOLN
INTRAVENOUS | Status: AC
  Administered 2012-06-17: 10 meq via INTRAVENOUS
  Filled 2012-06-17: qty 50

## 2012-06-17 MED ORDER — VANCOMYCIN HCL IN DEXTROSE 1-5 GM/200ML-% IV SOLN
1000.0000 mg | INTRAVENOUS | Status: DC
  Administered 2012-06-17: 1000 mg via INTRAVENOUS
  Filled 2012-06-17 (×2): qty 200

## 2012-06-17 MED ORDER — POTASSIUM CHLORIDE 10 MEQ/50ML IV SOLN
INTRAVENOUS | Status: AC
  Administered 2012-06-17: 10 meq via INTRAVENOUS
  Filled 2012-06-17: qty 100

## 2012-06-17 NOTE — Procedures (Addendum)
Arterial Catheter Insertion Procedure Note [name] [id number] [birth year]  Procedure: Insertion of Arterial Catheter  Indications: Blood pressure monitoring and Frequent blood sampling  Procedure Details Consent: Unable to obtain consent because of emergent medical necessity. Time Out: Verified patient identification, verified procedure, site/side was marked, verified correct patient position, special equipment/implants available, medications/allergies/relevent history reviewed, required imaging and test results available.  Performed  Maximum sterile technique was used including antiseptics, cap, gloves, gown, hand hygiene, mask and sheet. Skin prep: Chlorhexidine; local anesthetic administered 20 gauge catheter was inserted into left femoral artery using the Seldinger technique. Under direct visualization with ultrasound.  Evaluation Blood flow good; BP tracing good. Complications: No apparent complications.   Overton Mam 06/17/2012

## 2012-06-17 NOTE — Progress Notes (Signed)
INITIAL NUTRITION ASSESSMENT  DOCUMENTATION CODES Per approved criteria  -Obesity Unspecified   INTERVENTION: 1. Recommend initiation of EN once pt stable and if anticipated to remain intubated. If EN warranted, recommend initiate Osmolite 1.5 @ 20 ml/hr, this is the goal rate. 30 ml Pro-stat 5 times daily. This EN regimen would provide 1220 kcal, 105 gm protein, and 366 ml free water.   2. RD will continue to follow   NUTRITION DIAGNOSIS: Inadequate oral intake related to inability to eat as evidenced by NPO diet status.   Goal: Enteral nutrition to provide 60-70% of estimated calorie needs (22-25 kcals/kg ideal body weight) and >/= 90% of estimated protein needs, based on ASPEN guidelines for permissive underfeeding in critically ill obese individuals.   Monitor:  Vent status/pressor support needs, weight trends, labs, EN initiation  Reason for Assessment: VRDF  72 y.o. female  Admitting Dx: <principal problem not specified>  ASSESSMENT: Pt admitted with increased SOB while out shopping. Pt required intubation.  Pt remains intubated and on pressor support.   Recommend initiation of EN once pt more stable if planned to remain intubated.   Height: Ht Readings from Last 1 Encounters:  06/16/12 5' 2.99" (1.6 m)    Weight: Wt Readings from Last 1 Encounters:  06/17/12 190 lb 11.2 oz (86.5 kg)    Ideal Body Weight: 115 lbs   % Ideal Body Weight: 165%  Wt Readings from Last 10 Encounters:  06/17/12 190 lb 11.2 oz (86.5 kg)  06/05/12 176 lb 14.4 oz (80.241 kg)  04/27/12 184 lb 1.9 oz (83.516 kg)  04/19/12 180 lb (81.647 kg)  04/09/12 177 lb 0.5 oz (80.3 kg)  03/23/12 187 lb (84.823 kg)  03/17/12 183 lb (83.008 kg)  03/02/12 190 lb 6.4 oz (86.365 kg)  02/02/12 181 lb 11.2 oz (82.419 kg)  11/18/11 190 lb 7.6 oz (86.4 kg)    Usual Body Weight: ~180 lbs   % Usual Body Weight: 105%  BMI:  Body mass index is 33.79 kg/(m^2). Obesity class 1   Patient is currently  intubated on ventilator support.  MV: 11.5 Temp:Temp (24hrs), Avg:97.8 F (36.6 C), Min:97.3 F (36.3 C), Max:99.7 F (37.6 C)  Propofol: none   Estimated Nutritional Needs: Kcal: 1808 (underfeeding goal: 1610-9604) Protein: 106-120 gm  Fluid: >/= 1.2 L/day  Skin: intact   Diet Order: NPO  EDUCATION NEEDS: -No education needs identified at this time   Intake/Output Summary (Last 24 hours) at 06/17/12 1210 Last data filed at 06/17/12 1200  Gross per 24 hour  Intake 449.84 ml  Output   1775 ml  Net -1325.16 ml    Last BM: 2/7   Labs:   Recent Labs Lab 06/16/12 1738 06/17/12 0421  NA 136 135  K 3.9 2.5*  CL 102 100  CO2 18* 18*  BUN 17 22  CREATININE 2.27* 2.23*  CALCIUM 11.0* 10.6*  MG  --  1.8  PHOS  --  2.3  GLUCOSE 272* 408*    CBG (last 3)   Recent Labs  06/17/12 0013 06/17/12 0351 06/17/12 0736  GLUCAP 300* 355* 297*    Scheduled Meds: . antiseptic oral rinse  15 mL Mouth Rinse QID  . chlorhexidine  15 mL Mouth Rinse BID  . insulin aspart  0-15 Units Subcutaneous TID WC  . pantoprazole (PROTONIX) IV  40 mg Intravenous QHS  . vancomycin  1,000 mg Intravenous Q24H  . Warfarin - Pharmacist Dosing Inpatient   Does not apply [phone]  Continuous Infusions: . fentaNYL infusion INTRAVENOUS 25 mcg/hr (06/17/12 0500)  . midazolam (VERSED) infusion Stopped (06/17/12 0800)  . norepinephrine (LEVOPHED) Adult infusion Stopped (06/17/12 1100)    Past Medical History  Diagnosis Date  . CAD (coronary artery disease)     cath 11/11: LAD 40%, mid-dist 25-30%; prox CFX 30%, mid 40%; OM 50%; PDA 50%; PLV 50%;  EF 65%  . Acute myocardial infarction     in setting of AFib with RVR 03/2010  . Atrial fibrillation     DCCV 11/17/11; coumadin & amiodarone  . HOCM (hypertrophic obstructive cardiomyopathy)     Echo 11/2011:severe LVH, EF 65-70%, mid-cavity gradient up to  . Hypertension   . Tobacco abuse   . Chronic kidney disease     Stage 4  . CHF  (congestive heart failure)     preserved LVF  . Iron deficiency anemia   . Third degree uterine prolapse   . Hx MRSA infection   . Hx of cardiovascular stress test     a. Lex MV 12/13:  EF 56%, no ischemia    . Diabetes     Past Surgical History  Procedure Laterality Date  . Tubal ligation    . Cardioversion  11/17/2011    Procedure: CARDIOVERSION;  Surgeon: Hillis Range, MD;  Location: [facility] OR;  Service: Cardiovascular;  Laterality: N/A;    Clarene Duke RD, LDN Pager [phone] After Hours pager [phone]

## 2012-06-17 NOTE — Progress Notes (Signed)
Two gold colored rings removed from patients left hand and placed in specimen jar with patient label at bedside. Day nurse Lillia Abed and Sherrilyn Rist made aware and will give belongings to patients daughter when she comes to visit this morning.   Wyn Quaker RN

## 2012-06-17 NOTE — Progress Notes (Signed)
ANTICOAGULATION / ANTIBIOTIC CONSULT NOTE - Follow Up Consult  Pharmacy Consult:  Coumadin + Vancomycin Indication:  Atrial fibrillation + PNA  No Known Allergies  Patient Measurements: Height: 5' 2.99" (160 cm) Weight: 190 lb 11.2 oz (86.5 kg) IBW/kg (Calculated) : 52.38  Vital Signs: Temp: 97.6 F (36.4 C) (02/08 0900) Temp src: Oral (02/08 0351) BP: 128/62 mmHg (02/08 0900) Pulse Rate: 68 (02/08 0900)  Labs:  Recent Labs  06/16/12 1738 06/16/12 1739 06/16/12 2059 06/16/12 2312 06/17/12 0421  HGB 11.7*  --   --   --  9.2*  HCT 37.2  --   --   --  28.9*  PLT 352  --   --   --  305  LABPROT  --   --  32.2*  --  32.3*  INR  --   --  3.37*  --  3.38*  CREATININE 2.27*  --   --   --  2.23*  TROPONINI  --  <0.30  --  <0.30 <0.30    Estimated Creatinine Clearance: 24.1 ml/min (by C-G formula based on Cr of 2.23).       Assessment: 61 YOF recently discharged and presented with SOB.  She developed respiratory failure requiring intubation in the ED.  Pharmacy consulted to manage vancomycin for PNA and Coumadin for history of Afib.  Patient renal function is starting to improve.  Question whether patient received vancomycin 3gm in the ED.  Patient's INR is trending up despite Coumadin being on hold.  No bleeding reported.  Vanc 2/7 >>  2/7 urine cx - pending 2/7 blood cx - pending   Goal of Therapy:  INR 2-3 Monitor platelets by anticoagulation protocol: Yes    Plan:  - No Coumadin today - Daily PT / INR - Change vanc to 1gm IV Q24H - Monitor renal fxn, clinical course, vanc trough as indicated - F/U nutrition, broaden antibiotic to cover Gram negative pathogens - Consider rechecking BMET as additional KCL supplementation might be necessary    Caral Whan D. Laney Potash, PharmD, BCPS Pager:  [phone] 06/17/2012, 10:28 AM

## 2012-06-17 NOTE — Progress Notes (Signed)
7 units of insulin given to patient at 0430 am for CBG of 355 per verbal order from resident on call. Dr. Aviva Signs de la [place].   Wyn Quaker RN

## 2012-06-17 NOTE — Procedures (Signed)
Central Venous Catheter Insertion Procedure Note [name] [id number] 05-31-1940  Procedure: Insertion of Central Venous Catheter Indications: Assessment of intravascular volume, Drug and/or fluid administration and Frequent blood sampling  Procedure Details Consent: Unable to obtain consent because of emergent medical necessity. Time Out: Verified patient identification, verified procedure, site/side was marked, verified correct patient position, special equipment/implants available, medications/allergies/relevent history reviewed, required imaging and test results available.  Performed  Maximum sterile technique was used including antiseptics, cap, gloves, gown, hand hygiene, mask and sheet. Skin prep: Chlorhexidine; local anesthetic administered A antimicrobial bonded/coated triple lumen catheter was placed in the left subclavian vein using the Seldinger technique.  Evaluation Blood flow good Complications: No apparent complications Patient did tolerate procedure well. Chest X-ray ordered to verify placement.  CXR: normal.  Overton Mam, M.D. Pulmonary and Critical Care Medicine Call E-link with questions [phone] 06/17/2012, 6:36 AM

## 2012-06-17 NOTE — Progress Notes (Signed)
Called to pt in Trauma C who had just been intubated and whose condition was declining.  I met two daughters, Gabriel Rung and Velna Hatchet, and took them to meet with doctor and see their mother.  We shared prayer and encouragement.  Mother has COPD and has had previous hospital visits similar to this one.  Doctor told daughters she would be transferred to ICU.  Daughters left to attend to children.  Very appreciative of my care.

## 2012-06-17 NOTE — Progress Notes (Signed)
PULMONARY  / CRITICAL CARE MEDICINE  Name: [name] MRN: [id number] DOB: [birth year]    ADMISSION DATE:  06/16/2012  BRIEF PATIENT DESCRIPTION: 72 Y/O F with hx of diastolic CHF, Hypertrophic cardiomyopathy, CAD, Afib on Coumadin, CKD and DMT2 brought to ED by EMS after sudden onset of SOB while shopping. On ED was found to have O2 sat on the 50's not responding to Bipap. Was intubated and subsequently developed hypotension and bradycardia.   SIGNIFICANT EVENTS / STUDIES:  2/7 Intubated   LINES / TUBES:  2/7 Peripheral Right EJ  2/7 Peripheral Right forearm  2/7 CVC  2/7 A line  CULTURES:  2/7 Blood Cultures X2   ANTIBIOTICS:  2/7Vancomycin >> 2/7 2/7 Zosyn >> 2/7  SUBJECTIVE:  Intubated and sedated.   VITAL SIGNS: Temp:  [97.3 F (36.3 C)-99.7 F (37.6 C)] 97.6 F (36.4 C) (02/08 0351) Pulse Rate:  [54-108] 83 (02/08 0326) Resp:  [8-36] 22 (02/08 0326) BP: (57-175)/(32-103) 102/59 mmHg (02/08 0326) SpO2:  [37 %-100 %] 100 % (02/08 0326) Arterial Line BP: (99-198)/(92-166) 198/166 mmHg (02/07 2145) FiO2 (%):  [38 %-100 %] 70 % (02/08 0326) Weight:  [176 lb 5.9 oz (80 kg)] 176 lb 5.9 oz (80 kg) (02/07 1925)  HEMODYNAMICS:    VENTILATOR SETTINGS: Vent Mode:  [-] PRVC FiO2 (%):  [38 %-100 %] 70 % Set Rate:  [22 bmp] 22 bmp Vt Set:  [500 mL-550 mL] 550 mL PEEP:  [5 cmH20-12 cmH20] 12 cmH20 Plateau Pressure:  [25 cmH20-28 cmH20] 25 cmH20  INTAKE / OUTPUT: Intake/Output     02/07 0701 - 02/08 0700   I.V. (mL/kg) 87.4 (1.1)   Total Intake(mL/kg) 87.4 (1.1)   Urine (mL/kg/hr) 1150   Total Output 1150   Net -1062.6        PHYSICAL EXAMINATION: General: sedated intubated  Neuro: as above  Gen: NAD  HEENT: Moist mucous membranes  CV: Regular rate and rhythm, no murmurs rubs or gallops  PULM: basilar rales, no wheeze ABD: Obese, Soft, non distended, normal bowel sounds  EXT: no edema  LABS:  Recent Labs Lab 06/16/12 1738 06/16/12 1739 06/16/12 1747  06/16/12 2059 06/16/12 2312 06/16/12 2330 06/17/12 0420 06/17/12 0421 06/17/12 0500  HGB 11.7*  --   --   --   --   --   --  9.2*  --   WBC 14.7*  --   --   --   --   --   --  16.4*  --   PLT 352  --   --   --   --   --   --  305  --   NA 136  --   --   --   --   --   --  135  --   K 3.9  --   --   --   --   --   --  2.5*  --   CL 102  --   --   --   --   --   --  100  --   CO2 18*  --   --   --   --   --   --  18*  --   GLUCOSE 272*  --   --   --   --   --   --  408*  --   BUN 17  --   --   --   --   --   --  22  --   CREATININE 2.27*  --   --   --   --   --   --  2.23*  --   CALCIUM 11.0*  --   --   --   --   --   --  10.6*  --   MG  --   --   --   --   --   --   --  1.8  --   PHOS  --   --   --   --   --   --   --  2.3  --   AST 117*  --   --   --   --   --   --   --   --   ALT 55*  --   --   --   --   --   --   --   --   ALKPHOS 214*  --   --   --   --   --   --   --   --   BILITOT 0.3  --   --   --   --   --   --   --   --   PROT 8.2  --   --   --   --   --   --   --   --   ALBUMIN 3.7  --   --   --   --   --   --   --   --   INR  --   --   --  3.37*  --   --   --  3.38*  --   LATICACIDVEN  --  4.6*  --   --   --   --  6.1*  --   --   TROPONINI  --  <0.30  --   --  <0.30  --   --  <0.30  --   PHART  --   --  7.155*  --   --  7.266*  --   --  7.294*  PCO2ART  --   --  58.7*  --   --  42.1  --   --  34.8*  PO2ART  --   --  105.0*  --   --  219.0*  --   --  202.0*    Recent Labs Lab 06/16/12 2223 06/17/12 0013 06/17/12 0351  GLUCAP 221* 300* 355*   Imaging: Dg Chest Port 1 View  06/16/2012  *RADIOLOGY REPORT*  Clinical Data: Line placement  PORTABLE CHEST - 1 VIEW  Comparison: Plain film 06/16/2012 at 17 56  Findings: Interval placement of left central venous line with tip in the distal SVC.  Endotracheal tube unchanged.  Bilateral airspace disease is unchanged.  No pneumothorax.  IMPRESSION: Placement of left central venous line with tip in distal SVC.   Original Report  Authenticated By: Genevive Bi, M.D.    Dg Chest Port 1 View  06/16/2012  *RADIOLOGY REPORT*  Clinical Data: Respiratory failure, endotracheal tube placed  PORTABLE CHEST - 1 VIEW  Comparison: Chest x-ray of 06/03/2012  Findings: The tip of the endotracheal is approximately 4.5 cm above the carina.  There is diffuse airspace disease now present most consistent with edema.  Pneumonia cannot be excluded.  No definite effusion is seen.  Cardiomegaly is stable.  IMPRESSION:  1.  Endotracheal tip 4.5 cm above the carina. 2.  Diffuse airspace disease most consistent with edema.  Cannot exclude pneumonia.   Original Report Authenticated By: Dwyane Dee, M.D.      ASSESSMENT / PLAN:   PULMONARY  A:  Pulmonary edema with acute respiratory failure  P:  - full vent support - f/u chest xray  CARDIOVASCULAR  A:  CHF exacerbation >> last ECHO was 11/16/11 EF 65-70% and diastolic function was indeterminate.  Hx of Hypertrophic Cardiomyopathy  Hx CAD  Hx of Afib on coumadin  Hx HLD  P:  - Troponin I neg X 2 pending 3rd. - ECHO today to assess EF.  - Pt on pressors now. Levophed  - Coumadin per pharmacy.   RENAL  A:  Hx CKD cr at baseline.  Lactic acidosis most likely secondary to tissue hypoperfusion.  Hypokalemia P:  - Continue monitoring Cr and lytes  - Will diurese when more stable and off levophed.  - KCL repleted, f/u bmet - f/u lactic acid level  GASTROINTESTINAL  A: No issues  P:  NPO  Tube feeds if unable to wean vent soon  HEMATOLOGIC  A: Anemia of Chronic Disease. Hb dropped, no signs of major bleeding (minor per tube) P:  - Trend CBC  - Transfuse if < 7  INFECTIOUS  A: Initial concern for PNA >> unlikely. P: - d/c Abx - F/u Cx   ENDOCRINE  A:  Hx DM  P:  - SSI moderated   NEUROLOGIC  A: No issues  P:  - Continuous sedation with versed   I have personally obtained a history, examined the patient, evaluated laboratory and imaging results, formulated the  assessment and plan and placed orders. CRITICAL CARE: The patient is critically ill with multiple organ systems failure and requires high complexity decision making for assessment and support, frequent evaluation and titration of therapies, application of advanced monitoring technologies and extensive interpretation of multiple databases. Critical Care Time devoted to patient care services described in this note is  35 minutes.    Pulmonary and Critical Care Medicine [facility] Pager: [phone]  06/17/2012, 6:21 AM  Reviewed above, examined pt, and agree with assessment/plan.  Likely developed acute diastolic dysfunction leading to acute pulmonary edema and respiratory failure.  Likely developed hypotension due to diuresis and decreased preload in setting of positive pressure ventilation.  Doubt pneumonia.  Will d/c Abx.  Keep in even to negative fluid balance.  Wean off pressors as tolerated.  Continue vent support until more stable.  Coralyn Helling, MD [facility] Pulmonary/Critical Care 06/17/2012, 10:40 AM Pager:  [phone] After 3pm call: [phone]

## 2012-06-17 NOTE — Progress Notes (Signed)
CRITICAL VALUE ALERT  Critical value received:  2.5 potassium  Date of notification:  06/17/2012   Time of notification: 0515            Critical value read back: yes  Nurse who received alert:  Wyn Quaker  MD notified (1st page):  Dr. Aviva Signs [place]  Time of first page:  0518  MD notified (2nd page):  Time of second page:  Responding MD:  Dr. Aviva Signs de la [place]  Time MD responded:  6045  Wyn Quaker RN

## 2012-06-18 ENCOUNTER — Inpatient Hospital Stay (HOSPITAL_COMMUNITY): Payer: Medicare Other

## 2012-06-18 DIAGNOSIS — I4891 Unspecified atrial fibrillation: Secondary | ICD-10-CM

## 2012-06-18 LAB — BASIC METABOLIC PANEL WITH GFR
BUN: 28 mg/dL — ABNORMAL HIGH (ref 6–23)
CO2: 22 meq/L (ref 19–32)
Calcium: 11.5 mg/dL — ABNORMAL HIGH (ref 8.4–10.5)
Chloride: 106 meq/L (ref 96–112)
Creatinine, Ser: 2.04 mg/dL — ABNORMAL HIGH (ref 0.50–1.10)
GFR calc Af Amer: 27 mL/min — ABNORMAL LOW (ref >90)
GFR calc non Af Amer: 23 mL/min — ABNORMAL LOW (ref >90)
Glucose, Bld: 127 mg/dL — ABNORMAL HIGH (ref 70–99)
Potassium: 4.8 meq/L (ref 3.5–5.1)
Sodium: 135 meq/L (ref 135–145)

## 2012-06-18 LAB — URINE CULTURE
Colony Count: NO GROWTH
Culture: NO GROWTH

## 2012-06-18 LAB — IRON AND TIBC
Iron: 22 ug/dL — ABNORMAL LOW (ref 42–135)
Saturation Ratios: 9 % — ABNORMAL LOW (ref 20–55)
UIBC: 234 ug/dL (ref 125–400)

## 2012-06-18 LAB — GLUCOSE, CAPILLARY
Glucose-Capillary: 122 mg/dL — ABNORMAL HIGH (ref 70–99)
Glucose-Capillary: 136 mg/dL — ABNORMAL HIGH (ref 70–99)
Glucose-Capillary: 150 mg/dL — ABNORMAL HIGH (ref 70–99)

## 2012-06-18 LAB — PROTIME-INR
INR: 5.27 (ref 0.00–1.49)
Prothrombin Time: 44.6 s — ABNORMAL HIGH (ref 11.6–15.2)

## 2012-06-18 LAB — CBC
HCT: 25.9 % — ABNORMAL LOW (ref 36.0–46.0)
Hemoglobin: 8.2 g/dL — ABNORMAL LOW (ref 12.0–15.0)
RBC: 3.18 MIL/uL — ABNORMAL LOW (ref 3.87–5.11)

## 2012-06-18 LAB — LACTIC ACID, PLASMA: Lactic Acid, Venous: 0.8 mmol/L (ref 0.5–2.2)

## 2012-06-18 MED ORDER — DEXAMETHASONE SODIUM PHOSPHATE 4 MG/ML IJ SOLN
4.0000 mg | Freq: Four times a day (QID) | INTRAMUSCULAR | Status: DC
  Administered 2012-06-18 – 2012-06-20 (×9): 4 mg via INTRAVENOUS
  Filled 2012-06-18 (×12): qty 1

## 2012-06-18 MED ORDER — ALBUTEROL SULFATE (5 MG/ML) 0.5% IN NEBU
2.5000 mg | INHALATION_SOLUTION | RESPIRATORY_TRACT | Status: DC | PRN
  Administered 2012-06-18 (×2): 2.5 mg via RESPIRATORY_TRACT
  Filled 2012-06-18: qty 0.5

## 2012-06-18 MED ORDER — PANTOPRAZOLE SODIUM 40 MG PO TBEC
40.0000 mg | DELAYED_RELEASE_TABLET | Freq: Every day | ORAL | Status: DC
  Administered 2012-06-18: 40 mg via ORAL
  Filled 2012-06-18: qty 1

## 2012-06-18 MED ORDER — WHITE PETROLATUM GEL
Status: AC
  Administered 2012-06-18: 14:00:00
  Filled 2012-06-18: qty 5

## 2012-06-18 MED ORDER — FUROSEMIDE 20 MG PO TABS
20.0000 mg | ORAL_TABLET | Freq: Every day | ORAL | Status: DC
  Administered 2012-06-18 – 2012-06-22 (×4): 20 mg via ORAL
  Filled 2012-06-18 (×5): qty 1

## 2012-06-18 MED ORDER — METOPROLOL TARTRATE 25 MG PO TABS
25.0000 mg | ORAL_TABLET | Freq: Two times a day (BID) | ORAL | Status: DC
  Administered 2012-06-18 – 2012-06-22 (×6): 25 mg via ORAL
  Filled 2012-06-18 (×11): qty 1

## 2012-06-18 MED ORDER — ALBUTEROL SULFATE (5 MG/ML) 0.5% IN NEBU
2.5000 mg | INHALATION_SOLUTION | RESPIRATORY_TRACT | Status: DC
  Administered 2012-06-18 – 2012-06-20 (×10): 2.5 mg via RESPIRATORY_TRACT
  Filled 2012-06-18 (×10): qty 0.5

## 2012-06-18 MED ORDER — ATORVASTATIN CALCIUM 20 MG PO TABS
20.0000 mg | ORAL_TABLET | Freq: Every day | ORAL | Status: DC
  Administered 2012-06-18 – 2012-06-21 (×4): 20 mg via ORAL
  Filled 2012-06-18 (×5): qty 1

## 2012-06-18 MED ORDER — AMLODIPINE BESYLATE 5 MG PO TABS
5.0000 mg | ORAL_TABLET | Freq: Every day | ORAL | Status: DC
  Administered 2012-06-18 – 2012-06-20 (×3): 5 mg via ORAL
  Filled 2012-06-18 (×4): qty 1

## 2012-06-18 MED ORDER — ALBUTEROL SULFATE (5 MG/ML) 0.5% IN NEBU
INHALATION_SOLUTION | RESPIRATORY_TRACT | Status: AC
  Administered 2012-06-18: 2.5 mg via RESPIRATORY_TRACT
  Filled 2012-06-18: qty 0.5

## 2012-06-18 MED ORDER — INSULIN ASPART 100 UNIT/ML ~~LOC~~ SOLN
0.0000 [IU] | Freq: Three times a day (TID) | SUBCUTANEOUS | Status: DC
  Administered 2012-06-18: 0 [IU] via SUBCUTANEOUS
  Administered 2012-06-19 – 2012-06-20 (×4): 3 [IU] via SUBCUTANEOUS
  Administered 2012-06-20: 4 [IU] via SUBCUTANEOUS
  Administered 2012-06-21: 3 [IU] via SUBCUTANEOUS
  Administered 2012-06-21: 4 [IU] via SUBCUTANEOUS

## 2012-06-18 MED ORDER — AMIODARONE HCL 200 MG PO TABS
200.0000 mg | ORAL_TABLET | Freq: Every day | ORAL | Status: DC
  Administered 2012-06-18 – 2012-06-22 (×4): 200 mg via ORAL
  Filled 2012-06-18 (×5): qty 1

## 2012-06-18 MED FILL — Medication: Qty: 1 | Status: AC

## 2012-06-18 NOTE — Procedures (Signed)
Extubation Procedure Note  Patient Details:   Name: [name] DOB: [birth year] MRN: [id number]   Airway Documentation:     Evaluation  O2 sats: stable throughout Complications: No apparent complications Patient did tolerate procedure well. Bilateral Breath Sounds: Clear Suctioning: Airway Yes  Ave Filter 06/18/2012, 9:28 AM

## 2012-06-18 NOTE — Progress Notes (Signed)
  Echocardiogram 2D Echocardiogram has been performed.  [name] FRANCES 06/18/2012, 11:59 AM

## 2012-06-18 NOTE — Significant Event (Signed)
Pt developed stridor after extubation.  Will add decadron and BiPAP.  Coralyn Helling, MD [facility] Pulmonary/Critical Care 06/18/2012, 9:31 AM Pager:  [phone] After 3pm call: [phone]

## 2012-06-18 NOTE — Progress Notes (Signed)
CRITICAL VALUE ALERT  Critical value received:  INR= 5.27  Date of notification:  06/18/12  Time of notification:  0450  Critical value read back:yes  Nurse who received alert:  Thera Flake  MD notified (1st page):  Dr Bea Laura. [place]  Time of first page:  0503  MD notified (2nd page):  Time of second page:  Responding MD:  Dr Darrick Penna  Time MD responded:  [phone]

## 2012-06-18 NOTE — Progress Notes (Signed)
PULMONARY  / CRITICAL CARE MEDICINE  Name: [name] MRN: [id number] DOB: Mar 25, 1941    ADMISSION DATE:  06/16/2012  BRIEF PATIENT DESCRIPTION:  72 yo female admitted with acute dyspnea while shopping from pulmonary edema in setting of acute diastolic dysfunction.  Significant PMHx of Diastolic CHF, Hypertrophic CM, CAD, A fib on coumadin, CKD, DM type II  SIGNIFICANT EVENTS / STUDIES:  2/7 Intubated   LINES / TUBES:  2/7 Lt [place] CVL >> 2/7 A line >> 2/9   CULTURES:  Blood 2/7 >>  Urine 2/7 >>  ANTIBIOTICS:  2/7 Vancomycin >> 2/8 2/7 Zosyn >> 2/8  SUBJECTIVE:  Tolerating SBT.  VITAL SIGNS: Temp:  [97.6 F (36.4 C)-99.4 F (37.4 C)] 99.1 F (37.3 C) (02/09 0800) Pulse Rate:  [61-89] 78 (02/09 0800) Resp:  [14-25] 23 (02/09 0800) BP: (49-167)/(24-91) 167/88 mmHg (02/09 0800) SpO2:  [96 %-100 %] 100 % (02/09 0800) Arterial Line BP: (94-164)/(47-77) 164/77 mmHg (02/09 0800) FiO2 (%):  [40 %] 40 % (02/09 0800) Weight:  [190 lb 4.1 oz (86.3 kg)] 190 lb 4.1 oz (86.3 kg) (02/09 0500)  HEMODYNAMICS: CVP:  [8 mmHg-14 mmHg] 12 mmHg  VENTILATOR SETTINGS: Vent Mode:  [-] CPAP;PSV FiO2 (%):  [40 %] 40 % Set Rate:  [14 bmp] 14 bmp Vt Set:  [550 mL] 550 mL PEEP:  [5 cmH20] 5 cmH20 Pressure Support:  [5 cmH20] 5 cmH20 Plateau Pressure:  [18 cmH20-22 cmH20] 18 cmH20  INTAKE / OUTPUT: Intake/Output     02/08 0701 - 02/09 0700 02/09 0701 - 02/10 0700   I.V. (mL/kg) 554.5 (6.4) 20 (0.2)   NG/GT 50    IV Piggyback 260    Total Intake(mL/kg) 864.5 (10) 20 (0.2)   Urine (mL/kg/hr) 1005 (0.5) 175 (1)   Total Output 1005 175   Net -140.5 -155         PHYSICAL EXAMINATION: General: No distress Neuro: Following commands HEENT: ETT in place CV: s1s2 regular PULM: no wheeze ABD: soft, non tender EXT: no edema  LABS:  Recent Labs Lab 06/16/12 1738  06/16/12 1739 06/16/12 1747 06/16/12 2059 06/16/12 2312 06/16/12 2330 06/17/12 0420 06/17/12 0421 06/17/12 0500  06/17/12 1116 06/18/12 0358 06/18/12 0359  HGB 11.7*  --   --   --   --   --   --   --  9.2*  --   --  8.2*  --   WBC 14.7*  --   --   --   --   --   --   --  16.4*  --   --  24.1*  --   PLT 352  --   --   --   --   --   --   --  305  --   --  239  --   NA 136  --   --   --   --   --   --   --  135  --   --  135  --   K 3.9  --   --   --   --   --   --   --  2.5*  --   --  4.8  --   CL 102  --   --   --   --   --   --   --  100  --   --  106  --   CO2 18*  --   --   --   --   --   --   --  18*  --   --  22  --   GLUCOSE 272*  --   --   --   --   --   --   --  408*  --   --  127*  --   BUN 17  --   --   --   --   --   --   --  22  --   --  28*  --   CREATININE 2.27*  --   --   --   --   --   --   --  2.23*  --   --  2.04*  --   CALCIUM 11.0*  --   --   --   --   --   --   --  10.6*  --   --  11.5*  --   MG  --   --   --   --   --   --   --   --  1.8  --   --   --   --   PHOS  --   --   --   --   --   --   --   --  2.3  --   --   --   --   AST 117*  --   --   --   --   --   --   --   --   --   --   --   --   ALT 55*  --   --   --   --   --   --   --   --   --   --   --   --   ALKPHOS 214*  --   --   --   --   --   --   --   --   --   --   --   --   BILITOT 0.3  --   --   --   --   --   --   --   --   --   --   --   --   PROT 8.2  --   --   --   --   --   --   --   --   --   --   --   --   ALBUMIN 3.7  --   --   --   --   --   --   --   --   --   --   --   --   INR  --   --   --   --  3.37*  --   --   --  3.38*  --   --  5.27*  --   LATICACIDVEN  --   --  4.6*  --   --   --   --  6.1*  --   --   --   --  0.8  TROPONINI  --   < > <0.30  --   --  <0.30  --   --  <0.30  --  <0.30  --   --   PHART  --   --   --  7.155*  --   --  7.266*  --   --  7.294*  --   --   --  PCO2ART  --   --   --  58.7*  --   --  42.1  --   --  34.8*  --   --   --   PO2ART  --   --   --  105.0*  --   --  219.0*  --   --  202.0*  --   --   --   < > = values in this interval not displayed.  Recent Labs Lab  06/17/12 1528 06/17/12 1945 06/18/12 0004 06/18/12 0407 06/18/12 0723  GLUCAP 132* 94 136* 114* 122*   Imaging: Dg Chest Port 1 View  06/18/2012  *RADIOLOGY REPORT*  Clinical Data: Chest pain follow-up chest x-ray  PORTABLE CHEST - 1 VIEW  Comparison: Prior chest x-ray 06/17/2012  Findings: The tip of the endotracheal tube is 4.9 cm above the carina.  The left subclavian approach central venous catheter is in similar position with the tip projecting over the upper superior vena cava.  The nasogastric tube is high.  The tip projects over the GE junction and the proximal side port is within the distal thoracic esophagus.  Slightly increased pulmonary vascular congestion and likely mild interstitial edema.  Stable cardiomegaly with probable left atrial enlargement.  Probable small bilateral effusions.  IMPRESSION:  1.  The tip of the nasogastric tube is in the GE junction and the proximal side port in the distal thoracic esophagus.  If the intent is to decompress the stomach, recommend advancing 5-10 centimeters for more optimal placement. 2.  Similar to slightly increased pulmonary edema. 3.  Stable cardiomegaly with left atrial enlargement. 4.  Probable small bilateral effusions.   Original Report Authenticated By: Malachy Moan, M.D.    Dg Chest Port 1 View  06/17/2012  *RADIOLOGY REPORT*  Clinical Data: 72 year old female intubated.  PORTABLE CHEST - 1 VIEW  Comparison: 06/16/2012 and earlier.  Findings: Semi upright AP portable view 1024 hours.  Stable endotracheal tube tip in good position between level clavicles and carina.  Enteric tube now placed, side hole in the distal esophagus.  Stable left subclavian central line.   Stable cardiac size and mediastinal contours.  Decreased interstitial and perihilar opacity with mild to moderate residual. No pneumothorax.  No large effusion or consolidation.  IMPRESSION: 1.  Enteric tube placed, side hole the distal esophagus.  Advanced 8 cm for more optimal  placement. 2. Otherwise, stable lines and tubes. 3.  Decreased pulmonary edema.   Original Report Authenticated By: Erskine Speed, M.D.    Dg Chest Port 1 View  06/16/2012  *RADIOLOGY REPORT*  Clinical Data: Line placement  PORTABLE CHEST - 1 VIEW  Comparison: Plain film 06/16/2012 at 17 56  Findings: Interval placement of left central venous line with tip in the distal SVC.  Endotracheal tube unchanged.  Bilateral airspace disease is unchanged.  No pneumothorax.  IMPRESSION: Placement of left central venous line with tip in distal SVC.   Original Report Authenticated By: Genevive Bi, M.D.    Dg Chest Port 1 View  06/16/2012  *RADIOLOGY REPORT*  Clinical Data: Respiratory failure, endotracheal tube placed  PORTABLE CHEST - 1 VIEW  Comparison: Chest x-ray of 06/03/2012  Findings: The tip of the endotracheal is approximately 4.5 cm above the carina.  There is diffuse airspace disease now present most consistent with edema.  Pneumonia cannot be excluded.  No definite effusion is seen.  Cardiomegaly is stable.  IMPRESSION:  1.  Endotracheal tip 4.5 cm above the carina. 2.  Diffuse airspace disease most consistent with edema.  Cannot exclude pneumonia.   Original Report Authenticated By: Dwyane Dee, M.D.      ASSESSMENT / PLAN:   PULMONARY  A: Acute respiratory failure 2nd to pulmonary edema. P:  - extubate 2/9 - f/u CXR - oxygen to keep SpO2 > 92% - monitor for OSA  CARDIOVASCULAR  A: Acute on chronic diastolic CHF. Hx of Hypertrophic Cardiomyopathy, CAD, A fib, Hyperlipidemia. Transient hypotension after intubation 2/7 >> resolved. P:  -Even to negative fluid balance -Resume outpt anti-HTN and lipid meds -Coumadin per pharmacy -F/u Echo  RENAL  A:  Hx CKD cr at baseline.  Lactic acidosis most likely secondary to tissue hypoperfusion >> resolved.  Hypokalemia >> resolved. P:  - monitor renal fx, urine outpt - f/u and replace electrolytes as needed  GASTROINTESTINAL  A:  Nutrition. P:  - Advance diet after extubation.  HEMATOLOGIC  A: Anemia >> no evidence for bleeding. Leukocytosis P:  - Trend CBC  - Transfuse if < 7 - anemia panel  INFECTIOUS  A: Initial concern for PNA >> unlikely. P: - monitor off Abx  ENDOCRINE  A:  Hx DM  P:  - SSI moderated   NEUROLOGIC  A: No issues  P:  - monitor mental status  CC time 35 minutes.  Coralyn Helling, MD [facility] Pulmonary/Critical Care 06/18/2012, 9:07 AM Pager:  [phone] After 3pm call: [phone]

## 2012-06-18 NOTE — Progress Notes (Signed)
ANTICOAGULATION / ANTIBIOTIC CONSULT NOTE - Follow Up Consult  Pharmacy Consult:  Coumadin Indication:  Atrial fibrillation  No Known Allergies  Patient Measurements: Height: 5' 2.99" (160 cm) Weight: 190 lb 4.1 oz (86.3 kg) IBW/kg (Calculated) : 52.38  Vital Signs: Temp: 99.7 F (37.6 C) (02/09 1000) Temp src: Core (Comment) (02/09 0700) BP: 153/76 mmHg (02/09 1000) Pulse Rate: 88 (02/09 1000)  Labs:  Recent Labs  06/16/12 1738  06/16/12 2059 06/16/12 2312 06/17/12 0421 06/17/12 1116 06/18/12 0358  HGB 11.7*  --   --   --  9.2*  --  8.2*  HCT 37.2  --   --   --  28.9*  --  25.9*  PLT 352  --   --   --  305  --  239  LABPROT  --   --  32.2*  --  32.3*  --  44.6*  INR  --   --  3.37*  --  3.38*  --  5.27*  CREATININE 2.27*  --   --   --  2.23*  --  2.04*  TROPONINI  --   < >  --  <0.30 <0.30 <0.30  --   < > = values in this interval not displayed.  Estimated Creatinine Clearance: 26.4 ml/min (by C-G formula based on Cr of 2.04).       Assessment: 62 YOF recently discharged and presented with SOB.  She developed respiratory failure requiring intubation in the ED, now extubated.  Pharmacy managing Coumadin for her history of Afib.  INR supra-therapeutic although Coumadin was held upon admission.  No bleeding reported.   Goal of Therapy:  INR 2-3 Monitor platelets by anticoagulation protocol: Yes   Plan:  - No Coumadin today - Daily PT / INR    Quint Chestnut D. Laney Potash, PharmD, BCPS Pager:  [phone] 06/18/2012, 10:35 AM

## 2012-06-18 NOTE — Progress Notes (Addendum)
PULMONARY  / CRITICAL CARE MEDICINE  Name: [name] MRN: [id number] DOB: 03/07/1941    ADMISSION DATE:  06/16/2012  BRIEF PATIENT DESCRIPTION: 72 yo female admitted with acute dyspnea while shopping from pulmonary edema in setting of acute diastolic dysfunction.  Significant PMHx of Diastolic CHF, Hypertrophic CM, CAD, A fib on coumadin, CKD, DM type II   SIGNIFICANT EVENTS / STUDIES:  2/7 Intubated  2/9 Extubated  2/10-  Resolved stridor  LINES / TUBES:  2/7 Lt [place] CVL >>  2/7 A line >> 2/9   CULTURES:  Blood 2/7 >> neg Urine 2/7 >> neg Urine 2/9>>  ANTIBIOTICS:  2/7 Vancomycin >> 2/8  2/7 Zosyn >> 2/8   SUBJECTIVE: resolved stridor  VITAL SIGNS: Temp:  [97.2 F (36.2 C)-100 F (37.8 C)] 97.5 F (36.4 C) (02/10 0600) Pulse Rate:  [51-102] 55 (02/10 0600) Resp:  [22-35] 24 (02/10 0600) BP: (96-177)/(57-88) 102/57 mmHg (02/10 0600) SpO2:  [92 %-100 %] 96 % (02/10 0600) Arterial Line BP: (150-164)/(76-78) 150/78 mmHg (02/09 1000) FiO2 (%):  [36 %-50 %] 50 % (02/09 1110) Weight:  [184 lb 4.9 oz (83.6 kg)] 184 lb 4.9 oz (83.6 kg) (02/10 0500)  HEMODYNAMICS: CVP:  [12 mmHg] 12 mmHg  VENTILATOR SETTINGS: Vent Mode:  [-] CPAP;PSV FiO2 (%):  [36 %-50 %] 50 % PEEP:  [5 cmH20] 5 cmH20 Pressure Support:  [5 cmH20] 5 cmH20  INTAKE / OUTPUT: Intake/Output     02/09 0701 - 02/10 0700 02/10 0701 - 02/11 0700   P.O. 240    I.V. (mL/kg) 440 (5.3)    NG/GT     IV Piggyback     Total Intake(mL/kg) 680 (8.1)    Urine (mL/kg/hr) 2715 (1.4)    Total Output 2715     Net -2035            PHYSICAL EXAMINATION: General: No distress, no stridor, no distress Neuro: Following commands  CV: s1s2 regular  PULM: no wheeze or rales. ABD: soft, non tender  EXT: no edema  LABS:  Recent Labs Lab 06/16/12 1738  06/16/12 1739 06/16/12 1747  06/16/12 2312 06/16/12 2330 06/17/12 0420 06/17/12 0421 06/17/12 0500 06/17/12 1116 06/18/12 0358 06/18/12 0359 06/19/12 0358   HGB 11.7*  --   --   --   --   --   --   --  9.2*  --   --  8.2*  --  8.3*  WBC 14.7*  --   --   --   --   --   --   --  16.4*  --   --  24.1*  --  19.6*  PLT 352  --   --   --   --   --   --   --  305  --   --  239  --  228  NA 136  --   --   --   --   --   --   --  135  --   --  135  --  139  K 3.9  --   --   --   --   --   --   --  2.5*  --   --  4.8  --  4.6  CL 102  --   --   --   --   --   --   --  100  --   --  106  --  107  CO2 18*  --   --   --   --   --   --   --  18*  --   --  22  --  24  GLUCOSE 272*  --   --   --   --   --   --   --  408*  --   --  127*  --  158*  BUN 17  --   --   --   --   --   --   --  22  --   --  28*  --  36*  CREATININE 2.27*  --   --   --   --   --   --   --  2.23*  --   --  2.04*  --  2.08*  CALCIUM 11.0*  --   --   --   --   --   --   --  10.6*  --   --  11.5*  --  11.6*  MG  --   --   --   --   --   --   --   --  1.8  --   --   --   --   --   PHOS  --   --   --   --   --   --   --   --  2.3  --   --   --   --   --   AST 117*  --   --   --   --   --   --   --   --   --   --   --   --   --   ALT 55*  --   --   --   --   --   --   --   --   --   --   --   --   --   ALKPHOS 214*  --   --   --   --   --   --   --   --   --   --   --   --   --   BILITOT 0.3  --   --   --   --   --   --   --   --   --   --   --   --   --   PROT 8.2  --   --   --   --   --   --   --   --   --   --   --   --   --   ALBUMIN 3.7  --   --   --   --   --   --   --   --   --   --   --   --   --   INR  --   --   --   --   < >  --   --   --  3.38*  --   --  5.27*  --  7.38*  LATICACIDVEN  --   --  4.6*  --   --   --   --  6.1*  --   --   --   --  0.8  --   TROPONINI  --   < > <0.30  --   --  <  0.30  --   --  <0.30  --  <0.30  --   --   --   PHART  --   --   --  7.155*  --   --  7.266*  --   --  7.294*  --   --   --   --   PCO2ART  --   --   --  58.7*  --   --  42.1  --   --  34.8*  --   --   --   --   PO2ART  --   --   --  105.0*  --   --  219.0*  --   --  202.0*  --   --   --   --    < > = values in this interval not displayed.  Recent Labs Lab 06/18/12 0723 06/18/12 1140 06/18/12 1527 06/18/12 1942 06/18/12 2127  GLUCAP 122* 102* 96 145* 150*    CXR: 2/9 IMPRESSION:  1. The tip of the nasogastric tube is in the GE junction and the proximal side port in the distal thoracic esophagus. If the intent is to decompress the stomach, recommend advancing 5-10 centimeters for more optimal placement.  2. Similar to slightly increased pulmonary edema.  3. Stable cardiomegaly with left atrial enlargement.  4. Probable small bilateral effusions.  ASSESSMENT / PLAN:  PULMONARY  A: Acute respiratory failure 2nd to pulmonary edema, resolved stridor, r/o chronic int changes P:  - extubated 2/9  - f/u CXR prn - oxygen to keep SpO2 > 92%  - monitor for OSA, does not use NIMV currently -will need pulm follow up for chronic int changes? -control afterload  CARDIOVASCULAR  A: Acute on chronic diastolic CHF.  Hx of Hypertrophic Cardiomyopathy, CAD, A fib, Hyperlipidemia.  P:  -Even to negative fluid balance 1 liter as goal -Resume outpt anti-HTN and lipid meds, control afterload successful -Coumadin per pharmacy  -F/u Echo  -some lasix reduction may be needed, follow chem in am   RENAL  A:  Hx CKD cr at baseline.  Lactic acidosis most likely secondary to tissue hypoperfusion >> resolved.  Hypokalemia >> resolved.  P:  - monitor renal fx, urine outpt  - f/u and replace electrolytes as needed  -home lasix, follow crt trend, may need redcution  GASTROINTESTINAL  A: Nutrition.  P:  - Advance diet after extubation.  -if PPI not home med, dc as on diet when onr corrected  HEMATOLOGIC  A: Anemia >> no evidence for bleeding.  DVt prevntion hypercoagulable P:  - Trend CBC  - Transfuse if < 7  - anemia panel Mobilize -INR noted, dose vit  k 1 mg, repeat coags in am   INFECTIOUS  A: Initial concern for PNA >> unlikely.  P:  - monitor off Abx   ENDOCRINE   A:  Hx DM  P:  - SSI moderated   NEUROLOGIC  A: No issues  P:  - monitor mental status -PT, correc INR  Signed D. [place] Rolene Arbour, MD PGY-2  WIll dc line once INR at goal 2.5  I have fully examined this patient and agree with above findings.    And edited in full  To tele floor, triad transfer  Mcarthur Rossetti. Tyson Alias, MD, FACP Pgr: [phone] [place] [facility]

## 2012-06-19 ENCOUNTER — Inpatient Hospital Stay (HOSPITAL_COMMUNITY): Payer: Medicare Other

## 2012-06-19 LAB — BASIC METABOLIC PANEL
Chloride: 107 mEq/L (ref 96–112)
Creatinine, Ser: 2.08 mg/dL — ABNORMAL HIGH (ref 0.50–1.10)
GFR calc Af Amer: 26 mL/min — ABNORMAL LOW (ref >90)
Potassium: 4.6 mEq/L (ref 3.5–5.1)

## 2012-06-19 LAB — GLUCOSE, CAPILLARY
Glucose-Capillary: 124 mg/dL — ABNORMAL HIGH (ref 70–99)
Glucose-Capillary: 151 mg/dL — ABNORMAL HIGH (ref 70–99)

## 2012-06-19 LAB — PROTIME-INR
INR: 7.38 (ref 0.00–1.49)
Prothrombin Time: 57.6 seconds — ABNORMAL HIGH (ref 11.6–15.2)

## 2012-06-19 LAB — CBC
HCT: 25.7 % — ABNORMAL LOW (ref 36.0–46.0)
Hemoglobin: 8.3 g/dL — ABNORMAL LOW (ref 12.0–15.0)
RDW: 18.4 % — ABNORMAL HIGH (ref 11.5–15.5)
WBC: 19.6 10*3/uL — ABNORMAL HIGH (ref 4.0–10.5)

## 2012-06-19 MED ORDER — PANTOPRAZOLE SODIUM 40 MG IV SOLR
40.0000 mg | INTRAVENOUS | Status: DC
  Filled 2012-06-19: qty 40

## 2012-06-19 MED ORDER — PHYTONADIONE 1 MG/0.5 ML ORAL SOLUTION
1.0000 mg | Freq: Once | ORAL | Status: AC
  Administered 2012-06-19: 1 mg via ORAL
  Filled 2012-06-19: qty 0.5

## 2012-06-19 MED ORDER — PANTOPRAZOLE SODIUM 40 MG PO TBEC
40.0000 mg | DELAYED_RELEASE_TABLET | Freq: Every day | ORAL | Status: DC
  Administered 2012-06-19 – 2012-06-21 (×3): 40 mg via ORAL
  Filled 2012-06-19 (×3): qty 1

## 2012-06-19 NOTE — Progress Notes (Signed)
ANTICOAGULATION / ANTIBIOTIC CONSULT NOTE - Follow Up Consult  Pharmacy Consult:  Coumadin Indication:  Atrial fibrillation  No Known Allergies  Patient Measurements: Height: 5' 2.99" (160 cm) Weight: 184 lb 4.9 oz (83.6 kg) IBW/kg (Calculated) : 52.38  Vital Signs: Temp: 97.9 F (36.6 C) (02/10 1100) Temp src: Oral (02/10 0800) BP: 116/67 mmHg (02/10 1100) Pulse Rate: 56 (02/10 1100)  Labs:  Recent Labs  06/16/12 2312 06/17/12 0421 06/17/12 1116 06/18/12 0358 06/19/12 0358  HGB  --  9.2*  --  8.2* 8.3*  HCT  --  28.9*  --  25.9* 25.7*  PLT  --  305  --  239 228  LABPROT  --  32.3*  --  44.6* 57.6*  INR  --  3.38*  --  5.27* 7.38*  CREATININE  --  2.23*  --  2.04* 2.08*  TROPONINI <0.30 <0.30 <0.30  --   --     Estimated Creatinine Clearance: 25.4 ml/min (by C-G formula based on Cr of 2.08).  Assessment: Pharmacy managing Coumadin for her history of Afib.  INR supra-therapeutic and continues to increase (up to 7.38) although Coumadin held upon admission.  No bleeding reported. No significant drug interactions noted - pt continues on home amio. Noted plan to give vit K 1mg  po today.  Goal of Therapy:  INR 2-3 Monitor platelets by anticoagulation protocol: Yes   Plan:  - No Coumadin today - Daily PT / INR  Christoper Fabian, PharmD, BCPS Clinical pharmacist, pager [phone] 06/19/2012, 11:26 AM

## 2012-06-19 NOTE — Progress Notes (Signed)
CRITICAL VALUE ALERT  Critical value received:  INR= 7.38  Date of notification:  06/19/12  Time of notification:  0456  Critical value read back:yes  Nurse who received alert:  Dustin Flock RN  MD notified (1st page):  Dr Bea Laura Deterding  Time of first page:  [phone]  MD notified (2nd page):  Time of second page:  Responding MD:  Dr Bea Laura Deterding  Time MD responded:  [phone]

## 2012-06-19 NOTE — ED Provider Notes (Signed)
I have supervised the resident on the management of this patient and agree with the note above. I personally interviewed and examined the patient and my addendum is below.   [name] is a 72 y.o. female hx of CHF here with hypoxic respiratory failure. Had SOB, placed on CPAP by EMS but O2 sat in 50s. Patient intubated by resident under my supervision. CXR showed pulmonary edema. Patient given lasix and nitro gtt ordered however she became hypotensive so I held off nitro gtt. She then became bradycardic. She was started on levophed and BP improved. I think she is likely to have cardiogenic shock given bradycardia and hypotension. But can't exclude sepsis so cultures sent and broad spectrum antibiotics given. Patient admitted to the ICU.    CRITICAL CARE Performed by: Silverio Lay, Merari Pion   Total critical care time: 45 min   Critical care time was exclusive of separately billable procedures and treating other patients.  Critical care was necessary to treat or prevent imminent or life-threatening deterioration.  Critical care was time spent personally by me on the following activities: development of treatment plan with patient and/or surrogate as well as nursing, discussions with consultants, evaluation of patient's response to treatment, examination of patient, obtaining history from patient or surrogate, ordering and performing treatments and interventions, ordering and review of laboratory studies, ordering and review of radiographic studies, pulse oximetry and re-evaluation of patient's condition.    Richardean Canal, MD 06/19/12 [phone]

## 2012-06-20 DIAGNOSIS — N189 Chronic kidney disease, unspecified: Secondary | ICD-10-CM

## 2012-06-20 DIAGNOSIS — E119 Type 2 diabetes mellitus without complications: Secondary | ICD-10-CM

## 2012-06-20 LAB — GLUCOSE, CAPILLARY: Glucose-Capillary: 158 mg/dL — ABNORMAL HIGH (ref 70–99)

## 2012-06-20 LAB — PROTIME-INR: INR: 1.88 — ABNORMAL HIGH (ref 0.00–1.49)

## 2012-06-20 LAB — BASIC METABOLIC PANEL
CO2: 24 mEq/L (ref 19–32)
Chloride: 108 mEq/L (ref 96–112)
Creatinine, Ser: 1.97 mg/dL — ABNORMAL HIGH (ref 0.50–1.10)
Potassium: 4.2 mEq/L (ref 3.5–5.1)

## 2012-06-20 LAB — URINE CULTURE: Colony Count: NO GROWTH

## 2012-06-20 LAB — CBC
Hemoglobin: 8.4 g/dL — ABNORMAL LOW (ref 12.0–15.0)
RBC: 3.11 MIL/uL — ABNORMAL LOW (ref 3.87–5.11)
WBC: 16.4 10*3/uL — ABNORMAL HIGH (ref 4.0–10.5)

## 2012-06-20 LAB — PHOSPHORUS: Phosphorus: 2.8 mg/dL (ref 2.3–4.6)

## 2012-06-20 LAB — MAGNESIUM: Magnesium: 2.3 mg/dL (ref 1.5–2.5)

## 2012-06-20 MED ORDER — ALBUTEROL SULFATE (5 MG/ML) 0.5% IN NEBU
2.5000 mg | INHALATION_SOLUTION | Freq: Four times a day (QID) | RESPIRATORY_TRACT | Status: DC
  Administered 2012-06-20: 2.5 mg via RESPIRATORY_TRACT
  Filled 2012-06-20: qty 0.5

## 2012-06-20 MED ORDER — SODIUM CHLORIDE 0.9 % IJ SOLN: 10.0000 mL | INTRAMUSCULAR | Status: DC | PRN

## 2012-06-20 MED ORDER — WARFARIN SODIUM 4 MG PO TABS
4.0000 mg | ORAL_TABLET | Freq: Once | ORAL | Status: AC
  Administered 2012-06-20: 4 mg via ORAL
  Filled 2012-06-20: qty 1

## 2012-06-20 MED ORDER — DEXAMETHASONE SODIUM PHOSPHATE 4 MG/ML IJ SOLN
4.0000 mg | Freq: Two times a day (BID) | INTRAMUSCULAR | Status: DC
  Administered 2012-06-20 – 2012-06-21 (×2): 4 mg via INTRAVENOUS
  Filled 2012-06-20 (×3): qty 1

## 2012-06-20 NOTE — Progress Notes (Addendum)
[facility] PROGRESS NOTE  [name]:[id number] DOB: Aug 07, 1940 DOA: 06/16/2012 PCP: No primary provider on file.  Brief narrative 72 Y/O F with hx of diastolic CHF, Hypertrophic cardiomyopathy, CAD, Afib on Coumadin, CKD and DMT2 brought to ED on 06/16/12 by EMS after sudden onset of SOB while shopping. On ED was found to have O2 sat on the 50's not responding to Bipap. Was intubated and subsequently developed hypotension and bradycardia. She was admitted to intensive care unit under critical care service. Once she was stabilized, she was transferred to telemetry on 06/19/12   Assessment/Plan: 1. Acute respiratory failure 2nd to pulmonary edema, resolved stridor, r/o chronic int changes: Patient was admitted to ICU, had ventilatory support 2/7-2/9. Improved. Monitor sats of oxygen. Will need outpatient followup with pulmonary for chronic interstitial changes. Followup chest x-ray in a.m. Antibiotics were started empirically but discontinued this pneumonia was deemed less likely. Steroids were started for stridor-resolved-start tapering. 2. Acute on chronic diastolic CHF. Hx of Hypertrophic Cardiomyopathy, CAD, A fib, Hyperlipidemia: Improved. Continue Lasix. Creatinine stable. Patient was coagulopathic and Coumadin had been held. Resuming today. Continue amiodarone and metoprolol 3. Stage 4 chronic kidney disease: Creatinine at baseline. Follow BMP. 4. Type 2 diabetes mellitus: Reasonably controlled. Continue SSI. 5. Anemia: Stable. Transfuse if hemoglobin less than 7 g/dL. 6. Lactic acidosis: Secondary to tissue hypoperfusion. Resolved. 7. Hypokalemia: Repleted. 8. Leukocytosis:  9. Hypercalcemia: Unclear etiology. Asymptomatic but will need further workup as OP. 10. Abnormal LFTs: Possibly from hepatic congestion. Followup in a.m.    Code Status: Full Family Communication: Discussed with patient Disposition Plan: Home in medically stable-?  2/11   Consultants:  None  Procedures:  Intubation 2/7-2/9  Left [place] CVL 2/7-2/11.  A line 2/7-2/9  Antibiotics:  Vancomycin 2/7-2/8  Zosyn 2/7-2/8   HPI/Subjective: Denies dyspnea at rest and minimal activity. Has not walked much. No chest pain.  Objective: Filed Vitals:   06/19/12 2054 06/19/12 2346 06/20/12 0326 06/20/12 0517  BP:    117/61  Pulse:    54  Temp:    97.9 F (36.6 C)  TempSrc:    Oral  Resp:    20  Height:      Weight:    82.4 kg (181 lb 10.5 oz)  SpO2: 96% 96% 96% 98%    Intake/Output Summary (Last 24 hours) at 06/20/12 0933 Last data filed at 06/20/12 0500  Gross per 24 hour  Intake    620 ml  Output   1150 ml  Net   -530 ml   Filed Weights   06/18/12 0500 06/19/12 0500 06/20/12 0517  Weight: 86.3 kg (190 lb 4.1 oz) 83.6 kg (184 lb 4.9 oz) 82.4 kg (181 lb 10.5 oz)    Exam:   General exam: Comfortable.  Respiratory system: Occasional basal crackles otherwise clear to auscultation. No increased work of breathing.  Cardiovascular system: S1 & S2 heard, RRR. No JVD, murmurs, gallops, clicks. 1+ bilateral pedal edema. Telemetry shows mostly sinus rhythm and occasional sinus bradycardia.  Gastrointestinal system: Abdomen is nondistended, soft and nontender. Normal bowel sounds heard.  Central nervous system: Alert and oriented. No focal neurological deficits.  Extremities: Symmetric 5 x 5 power.   Data Reviewed: Basic Metabolic Panel:  Recent Labs Lab 06/16/12 1738 06/17/12 0421 06/18/12 0358 06/19/12 0358 06/20/12 0500  NA 136 135 135 139 141  K 3.9 2.5* 4.8 4.6 4.2  CL 102 100 106 107 108  CO2 18* 18* 22 24 24   GLUCOSE 272*  408* 127* 158* 149*  BUN 17 22 28* 36* 45*  CREATININE 2.27* 2.23* 2.04* 2.08* 1.97*  CALCIUM 11.0* 10.6* 11.5* 11.6* 11.9*  MG  --  1.8  --   --  2.3  PHOS  --  2.3  --   --  2.8   Liver Function Tests:  Recent Labs Lab 06/16/12 1738  AST 117*  ALT 55*  ALKPHOS 214*  BILITOT 0.3  PROT  8.2  ALBUMIN 3.7   No results found for this basename: LIPASE, AMYLASE,  in the last 168 hours No results found for this basename: AMMONIA,  in the last 168 hours CBC:  Recent Labs Lab 06/16/12 1738 06/17/12 0421 06/18/12 0358 06/19/12 0358 06/20/12 0500  WBC 14.7* 16.4* 24.1* 19.6* 16.4*  NEUTROABS 8.8*  --   --   --   --   HGB 11.7* 9.2* 8.2* 8.3* 8.4*  HCT 37.2 28.9* 25.9* 25.7* 25.4*  MCV 84.2 81.9 81.4 82.9 81.7  PLT 352 305 239 228 225   Cardiac Enzymes:  Recent Labs Lab 06/16/12 1739 06/16/12 2312 06/17/12 0421 06/17/12 1116  TROPONINI <0.30 <0.30 <0.30 <0.30   BNP (last 3 results)  Recent Labs  04/06/12 1957 04/15/12 2000 06/02/12 0148  PROBNP 1727.0* 1866.0* 1173.0*   CBG:  Recent Labs Lab 06/19/12 0824 06/19/12 1202 06/19/12 1634 06/19/12 2140 06/20/12 0543  GLUCAP 144* 124* 122* 151* 158*    Recent Results (from the past 240 hour(s))  CULTURE, BLOOD (ROUTINE X 2)     Status: None   Collection Time    06/16/12  6:45 PM      Result Value Range Status   Specimen Description BLOOD RIGHT HAND   Final   Special Requests BOTTLES DRAWN AEROBIC ONLY 1CC   Final   Culture  Setup Time 06/17/2012 02:13   Final   Culture     Final   Value:        BLOOD CULTURE RECEIVED NO GROWTH TO DATE CULTURE WILL BE HELD FOR 5 DAYS BEFORE ISSUING A FINAL NEGATIVE REPORT   Report Status PENDING   Incomplete  CULTURE, BLOOD (ROUTINE X 2)     Status: None   Collection Time    06/16/12  6:52 PM      Result Value Range Status   Specimen Description BLOOD LEFT ARM   Final   Special Requests BOTTLES DRAWN AEROBIC ONLY 4CC   Final   Culture  Setup Time 06/17/2012 02:15   Final   Culture     Final   Value:        BLOOD CULTURE RECEIVED NO GROWTH TO DATE CULTURE WILL BE HELD FOR 5 DAYS BEFORE ISSUING A FINAL NEGATIVE REPORT   Report Status PENDING   Incomplete  MRSA PCR SCREENING     Status: None   Collection Time    06/16/12 10:23 PM      Result Value Range Status    MRSA by PCR NEGATIVE  NEGATIVE Final   Comment:            The GeneXpert MRSA Assay (FDA     approved for NASAL specimens     only), is one component of a     comprehensive MRSA colonization     surveillance program. It is not     intended to diagnose MRSA     infection nor to guide or     monitor treatment for     MRSA infections.  URINE CULTURE  Status: None   Collection Time    06/16/12 10:23 PM      Result Value Range Status   Specimen Description URINE, CATHETERIZED   Final   Special Requests NONE   Final   Culture  Setup Time 06/17/2012 00:10   Final   Colony Count NO GROWTH   Final   Culture NO GROWTH   Final   Report Status 06/18/2012 FINAL   Final  URINE CULTURE     Status: None   Collection Time    06/18/12  5:51 PM      Result Value Range Status   Specimen Description URINE, CATHETERIZED   Final   Special Requests NONE   Final   Culture  Setup Time 06/19/2012 03:34   Final   Colony Count NO GROWTH   Final   Culture NO GROWTH   Final   Report Status 06/20/2012 FINAL   Final     Studies: Dg Chest Port 1 View  06/19/2012  *RADIOLOGY REPORT*  Clinical Data: Shortness of breath.  Congestive heart failure.  PORTABLE CHEST - 1 VIEW  Comparison: Chest 06/17/2012 and 06/18/2012.  Findings: NG tube and endotracheal tube have been removed.  Left subclavian central venous catheter remains in place.  There has been some increase and bibasilar airspace disease with small effusion is identified.  Cardiomegaly and mild interstitial edema persist although edema shows some mild improvement.  IMPRESSION:  1.  Status post extubation and NG tube removal. 2.  New small bilateral pleural effusions and basilar airspace disease. 3.  Mild improvement in interstitial edema.   Original Report Authenticated By: Holley Dexter, M.D.      Additional labs:   Scheduled Meds: . albuterol  2.5 mg Nebulization Q6H  . amiodarone  200 mg Oral Daily  . amLODipine  5 mg Oral Daily  .  atorvastatin  20 mg Oral q1800  . dexamethasone  4 mg Intravenous Q6H  . furosemide  20 mg Oral Daily  . insulin aspart  0-20 Units Subcutaneous TID WC  . metoprolol tartrate  25 mg Oral BID  . pantoprazole  40 mg Oral Q1200  . Warfarin - Pharmacist Dosing Inpatient   Does not apply q1800   Continuous Infusions:   Active Problems:   * No active hospital problems. *  Additional labs  Anemia panel: Iron 22, total iron binding capacity 256, ferritin 192  INR: 1.88  TSH: 0.088  Urine Legionella and strep antigen negative.   2-D echo:Study Conclusions  - Left ventricle: The cavity size was normal. There was severe concentric hypertrophy. Systolic function was vigorous. The estimated ejection fraction was in the range of 65% to 70%. Wall motion was normal; there were no regional wall motion abnormalities. Features are consistent with a pseudonormal left ventricular filling pattern, with concomitant abnormal relaxation and increased filling pressure (grade 2 diastolic dysfunction). - Mitral valve: Calcified annulus. - Pulmonary arteries: Systolic pressure was mildly to moderately increased. PA peak pressure: 49mm Hg (S).   Impressions:  - Compared to the prior study, there has been no significant interval change.   Time spent: 45 minute    [facility]  [facility] Pager [phone].   If 8PM-8AM, please contact night-coverage at [email or web address], password [facility] 06/20/2012, 9:33 AM  LOS: 4 days

## 2012-06-20 NOTE — Progress Notes (Signed)
06/20/2012 2230  Pt HR on monitor 48 SB with a BP 122/75.  Pt due lopressor 25mg  PO.  MD made aware of HR and order received to hold lopressor tonight. Will continue to monitor closely. Nattaly Yebra, Avie Echevaria , RN

## 2012-06-20 NOTE — Care Management Note (Signed)
    Page 1 of 2   06/22/2012     3:46:35 PM   CARE MANAGEMENT NOTE 06/22/2012  Patient:  [name], [name]   Account Number:  [id number]  Date Initiated:  06/20/2012  Documentation initiated by:  Kyleena Scheirer  Subjective/Objective Assessment:   PT ADM WITH CHF ON 06/16/12.  PTA, PT RESIDES AT HOME WITH DAUGHTER WHO PROVIDES 24HR CARE.     Action/Plan:   MET WITH PT TO DISCUSS DC PLANS.  PT HAS USED [facility] IN THE PAST FOR HOME CARE.  REQUESTS ROLLATOR WALKER FOR HOME. WOULD RECOMMEND HHRN FOR CHF FOLLOW UP.   Anticipated DC Date:  06/22/2012   Anticipated DC Plan:  HOME W HOME HEALTH SERVICES      DC Planning Services  CM consult      [facility] Choice  HOME HEALTH   Choice offered to / List presented to:  C-1 Patient   DME arranged  Levan Hurst      DME agency  [facility]     [facility] arranged  HH-1 RN  HH-10 DISEASE MANAGEMENT  HH-2 PT      [facility] agency  [facility]   Status of service:  Completed, signed off Medicare Important Message given?   (If response is "NO", the following Medicare IM given date fields will be blank) Date Medicare IM given:   Date Additional Medicare IM given:    Discharge Disposition:  HOME W HOME HEALTH SERVICES  Per UR Regulation:  Reviewed for med. necessity/level of care/duration of stay  If discussed at Long Length of Stay Meetings, dates discussed:    Comments:  06/22/12 Rosalita Chessman [phone] PT FOR DC HOME TODAY.  WILL NEED HH FOLLOW UP.  REFERRAL TO [facility], PER PT CHOICE.  ROLLATOR WALKER TO BE SHIPPED TO PT PER [facility] EQUIPMENT REP.  PT HAS NEIGHBORS' WALKER TO USE IN THE INTERIM.  START OF CARE 24-48H POST DC DATE.  06/20/12 Kennidi Yoshida,RN,BSN [phone] PT WOULD BENEFIT FROM PHYSICAL THERAPY  CONSULT TO DETERMINE HOME NEEDS AT DC.  WILL FOLLOW.

## 2012-06-20 NOTE — Progress Notes (Signed)
ANTICOAGULATION / ANTIBIOTIC CONSULT NOTE - Follow Up Consult  Pharmacy Consult:  Coumadin Indication:  Atrial fibrillation  No Known Allergies  Patient Measurements: Height: 5' 2.99" (160 cm) Weight: 181 lb 10.5 oz (82.4 kg) IBW/kg (Calculated) : 52.38  Vital Signs: Temp: 97.9 F (36.6 C) (02/11 0517) Temp src: Oral (02/11 0517) BP: 117/61 mmHg (02/11 0517) Pulse Rate: 54 (02/11 0517)  Labs:  Recent Labs  06/17/12 1116  06/18/12 0358 06/19/12 0358 06/20/12 0500  HGB  --   < > 8.2* 8.3* 8.4*  HCT  --   --  25.9* 25.7* 25.4*  PLT  --   --  239 228 225  LABPROT  --   --  44.6* 57.6* 20.9*  INR  --   --  5.27* 7.38* 1.88*  CREATININE  --   --  2.04* 2.08* 1.97*  TROPONINI <0.30  --   --   --   --   < > = values in this interval not displayed.  Estimated Creatinine Clearance: 26.6 ml/min (by C-G formula based on Cr of 1.97).  Assessment: Pharmacy managing Coumadin for her history of Afib.  INR supra-therapeutic on admission, now reversed with 1 mg PO Vit K.  Last Coumadin dose was 2/7 PTA.    Pt unsure of home Coumadin regimen - has 5mg  tabs and takes 1/2 tab (2.5 mg) 3-4 x a week, whole tab other days.  Will give Coumadin 4mg  tonight.  Anticipate INR will drop further since doses held for several days and Vit K given.  No bleeding reported. No new drug interactions noted - pt continues on home amio.   Goal of Therapy:  INR 2-3  Plan:  - Coumadin 4mg  PO x 1 tonight. - Daily PT / INR.  [employer], Pharm.D., BCPS Clinical Pharmacist Pager [phone] 06/20/2012 9:33 AM

## 2012-06-20 NOTE — Progress Notes (Signed)
Removed central line to left subclavian per MD order per hospital policy. Patient tolerated well. Applied Vaseline guaze and guaze to site and held pressure for 5 minutes. Patient advised to stay in bed for 30 minutes. Will continue to monitor closely. Lajuana Matte, RN

## 2012-06-21 ENCOUNTER — Inpatient Hospital Stay (HOSPITAL_COMMUNITY): Payer: Medicare Other

## 2012-06-21 LAB — GLUCOSE, CAPILLARY
Glucose-Capillary: 130 mg/dL — ABNORMAL HIGH (ref 70–99)
Glucose-Capillary: 169 mg/dL — ABNORMAL HIGH (ref 70–99)

## 2012-06-21 LAB — CBC
HCT: 28 % — ABNORMAL LOW (ref 36.0–46.0)
Hemoglobin: 9.1 g/dL — ABNORMAL LOW (ref 12.0–15.0)
MCH: 26.3 pg (ref 26.0–34.0)
MCHC: 32.5 g/dL (ref 30.0–36.0)
RBC: 3.46 MIL/uL — ABNORMAL LOW (ref 3.87–5.11)

## 2012-06-21 LAB — BASIC METABOLIC PANEL
BUN: 45 mg/dL — ABNORMAL HIGH (ref 6–23)
CO2: 21 mEq/L (ref 19–32)
Calcium: 12.1 mg/dL — ABNORMAL HIGH (ref 8.4–10.5)
GFR calc non Af Amer: 28 mL/min — ABNORMAL LOW (ref >90)
Glucose, Bld: 166 mg/dL — ABNORMAL HIGH (ref 70–99)
Potassium: 4.8 mEq/L (ref 3.5–5.1)

## 2012-06-21 LAB — HEPATIC FUNCTION PANEL
AST: 15 U/L (ref 0–37)
Alkaline Phosphatase: 125 U/L — ABNORMAL HIGH (ref 39–117)
Bilirubin, Direct: <0.1 mg/dL (ref 0.0–0.3)
Total Bilirubin: 0.5 mg/dL (ref 0.3–1.2)

## 2012-06-21 MED ORDER — PREDNISONE 20 MG PO TABS
40.0000 mg | ORAL_TABLET | Freq: Every day | ORAL | Status: DC
  Administered 2012-06-22: 40 mg via ORAL
  Filled 2012-06-21 (×2): qty 2

## 2012-06-21 MED ORDER — WARFARIN SODIUM 6 MG PO TABS
6.0000 mg | ORAL_TABLET | Freq: Once | ORAL | Status: AC
  Administered 2012-06-21: 6 mg via ORAL
  Filled 2012-06-21 (×2): qty 1

## 2012-06-21 MED ORDER — METOPROLOL TARTRATE 12.5 MG HALF TABLET
12.5000 mg | ORAL_TABLET | Freq: Once | ORAL | Status: AC
  Administered 2012-06-21: 12.5 mg via ORAL
  Filled 2012-06-21: qty 1

## 2012-06-21 NOTE — Evaluation (Signed)
Physical Therapy Evaluation Patient Details Name: [name] MRN: [id number] DOB: 1940-05-12 Today's Date: 06/21/2012 Time: 1308-6578 PT Time Calculation (min): 29 min  PT Assessment / Plan / Recommendation Clinical Impression  Pt admit with Respiratory failure, CHF and afib with decr mobility secondary to decr balance and decr endurance.  Will benefit from PT to address these issues.  HAs 24 hour care at home therefore should be able to d/c home with HHPT f/u and rollator.  (Pt reports she was suppoosed to get a rollator last admit and she did not).    PT Assessment  Patient needs continued PT services    Follow Up Recommendations  Home health PT;Supervision/Assistance - 24 hour                Equipment Recommendations  Other (comment) (rollator - pt reports it was not ordered last admit)         Frequency Min 3X/week    Precautions / Restrictions Precautions Precautions: Fall Restrictions Weight Bearing Restrictions: No   Pertinent Vitals/Pain Desat on RA to 80%, replaced 3LO2 with sat 92% and >, No pain      Mobility  Bed Mobility Bed Mobility: Rolling Left;Left Sidelying to Sit;Sitting - Scoot to Edge of Bed Rolling Left: 4: Min assist;With rail Left Sidelying to Sit: 4: Min assist;With rails;HOB flat Sitting - Scoot to [place] of Bed: 4: Min guard Details for Bed Mobility Assistance: cues for technique Transfers Transfers: Sit to Stand;Stand to Sit Sit to Stand: 4: Min guard;With upper extremity assist;From bed Stand to Sit: 4: Min assist;With upper extremity assist;To chair/3-in-1;With armrests Details for Transfer Assistance: cues for hand placement.  Assisted pt to [facility] commode with pt assisting min guard assist.  Pt cleaned herself when done but PT assisted pulling up her panties.  Ambulation/Gait Ambulation/Gait Assistance: 4: Min assist Ambulation Distance (Feet): 75 Feet Assistive device: Rolling walker Ambulation/Gait Assistance Details: Cuing for safety  with RW and for technique with pt not staying in center of RW therefore needing steadying assist at times.  Pt desat to 80% on RA with ambulation therefore replaced O2 at 3L with O2 at 92% and >.  Gait Pattern: Step-through pattern;Trunk flexed Gait velocity: decreased Stairs: No Wheelchair Mobility Wheelchair Mobility: No         PT Diagnosis: Generalized weakness  PT Problem List: Decreased activity tolerance;Decreased balance;Decreased mobility;Decreased knowledge of use of DME;Decreased safety awareness;Decreased knowledge of precautions;Cardiopulmonary status limiting activity PT Treatment Interventions: DME instruction;Gait training;Functional mobility training;Balance training;Therapeutic exercise;Therapeutic activities;Patient/family education   PT Goals Acute Rehab PT Goals PT Goal Formulation: With patient Time For Goal Achievement: 07/05/12 Potential to Achieve Goals: Good Pt will go Supine/Side to Sit: with modified independence PT Goal: Supine/Side to Sit - Progress: Goal set today Pt will go Sit to Stand: with modified independence;with upper extremity assist PT Goal: Sit to Stand - Progress: Goal set today Pt will Ambulate: 51 - 150 feet;with supervision;with least restrictive assistive device PT Goal: Ambulate - Progress: Goal set today Pt will Perform Home Exercise Program: Independently PT Goal: Perform Home Exercise Program - Progress: Goal set today  Visit Information  Last PT Received On: 06/21/12 Assistance Needed: +1    Subjective Data  Subjective: "I will try." Patient Stated Goal: Go home   Prior Functioning  Home Living Lives With: Alone Available Help at Discharge: Family;Available PRN/intermittently Type of Home: Apartment Home Access: Elevator Home Layout: One level Bathroom Shower/Tub: Tub/shower unit;Curtain Firefighter: Standard Home Adaptive Equipment: Other (  comment) (Had borrowed the 4 wheeled RW) Prior Function Level of  Independence: Independent Able to Take Stairs?: No Driving: No Vocation: Retired Musician: No difficulties Dominant Hand: Right    Cognition  Cognition Overall Cognitive Status: Appears within functional limits for tasks assessed/performed Arousal/Alertness: Awake/alert Orientation Level: Appears intact for tasks assessed Behavior During Session: [facility] for tasks performed    Extremity/Trunk Assessment Right Lower Extremity Assessment RLE ROM/Strength/Tone: [facility] for tasks assessed Left Lower Extremity Assessment LLE ROM/Strength/Tone: [facility] for tasks assessed   Balance Static Sitting Balance Static Sitting - Balance Support: Bilateral upper extremity supported;Feet supported Static Sitting - Level of Assistance: 7: Independent Static Sitting - Comment/# of Minutes: 4 Static Standing Balance Static Standing - Balance Support: Bilateral upper extremity supported;During functional activity Static Standing - Level of Assistance: 5: Stand by assistance Static Standing - Comment/# of Minutes: 2 minutes  End of Session PT - End of Session Equipment Utilized During Treatment: Gait belt;Oxygen Activity Tolerance: Patient limited by fatigue Patient left: in chair;with call bell/phone within reach;with family/visitor present Nurse Communication: Mobility status       INGOLD,Graig Hessling 06/21/2012, 11:59 AM [facility] Acute Rehabilitation [phone] (pager)

## 2012-06-21 NOTE — Progress Notes (Signed)
[facility] PROGRESS NOTE  KYNLI CHOU AVW:[id number] DOB: August 01, 1940 DOA: 06/16/2012 PCP: No primary provider on file.  Assessment/Plan: Principal Problem:   Acute respiratory failure with hypoxia Active Problems:   Acute on chronic diastolic congestive heart failure   CKD (chronic kidney disease)   Diabetes mellitus, type 2    1. Acute respiratory failure: Patient presented with SOB and acute hypoxic respiratory failure  2nd to pulmonary edema, stridor and chronic interstitial lung  Changes. She was admitted to ICU, had ventilatory support 06/16/12-06/18/12, as well as specific measures, with clinical improvement. Serial CXRs have demonstrated interval improvement of CHF. Antibiotics were started empirically but discontinued when it became obvious that pneumonia was unlikely. Parenteral steroids were started for stridor, but with resolution, patient was transitioned to oral taper.  2. Acute on chronic diastolic CHF: Patient's CHF was decompensated at presentation, against the background of known Hypertrophic Cardiomyopathy, CAD, A fib. She was successfully managed with iv Lasix, and subsequent transitioned to oral Lasix, without deleterious effect.  3 Atrial Fibrillation: Patient remained rate-controlled and even bradycardic on Amiodarone and Metoprolol. She was coagulopathic, with supra-therapeutic INR on presentation. Coumadin was held, to allow INR to drift down. This has now been re-started as of 06/20/12.  4. Stage 4 chronic kidney disease: Baseline creatinine was 1.95 on 04/15/1012. Creatinine at baseline. Following BMP. 5. Type 2 diabetes mellitus: This was apparently diet-controlled, per family. Exacerbated by steroid therapy. Reasonably controlled with SSI. Further improvement is anticipated. 6. Anemia: Mild, normocytic and stable. Likely due to chronic disease.  7. Lactic acidosis: Secondary to tissue hypoperfusion. Resolved. 8. Hypokalemia: Repleted as indicated. 9.  Hypercalcemia: Unclear etiology. Patient was noted incidentally, to have calcium levels, ranging between 11.6-12.1, and albumiin of 3.1. Asymptomatic but will need further workup as outpatient. Have ordered intact PTH. 10. Abnormal LFTs: Mild transaminitis at presentation, with AST 117, ALT 55, Alk Phos 214. Possibly from hepatic congestion, due to CHF. Resolved. 11. HTN: BP is borderline today at 107. Have discontinued Amlodipine.    Code Status: Full Code.  Family Communication:  Disposition Plan: Aiming possible discharge on 06/22/12.    Brief narrative: 72 Y/O F with hx of diastolic CHF, Hypertrophic cardiomyopathy, CAD, Afib on Coumadin, CKD and DMT2 brought to ED on 06/16/12 by EMS after sudden onset of SOB while shopping. On ED was found to have O2 sat on the 50's not responding to Bipap. Was intubated and subsequently developed hypotension and bradycardia. She was admitted to intensive care unit under critical care service. Once she was stabilized, she was transferred to telemetry on 2/10/1   Consultants:  N/A.   Procedures:  CXR.   Antibiotics:  N/A.   HPI/Subjective: Feels much better today, keen to go home.   Objective: Vital signs in last 24 hours: Temp:  [97.9 F (36.6 C)] 97.9 F (36.6 C) (02/12 0419) Pulse Rate:  [50-54] 54 (02/12 1345) Resp:  [20] 20 (02/12 1345) BP: (107-136)/(63-76) 136/76 mmHg (02/12 1345) SpO2:  [80 %-100 %] 100 % (02/12 1345) Weight:  [81.285 kg (179 lb 3.2 oz)] 81.285 kg (179 lb 3.2 oz) (02/12 0419) Weight change: -1.115 kg (-2 lb 7.3 oz) Last BM Date: 06/19/12  Intake/Output from previous day: 02/11 0701 - 02/12 0700 In: 600 [P.O.:600] Out: 500 [Urine:500] Total I/O In: 240 [P.O.:240] Out: 400 [Urine:400]   Physical Exam: General: Comfortable, alert, communicative, fully oriented, not short of breath at rest, but short of breath on mild exertion.  HEENT:  Mild clinical pallor,  no jaundice, no conjunctival injection or  discharge. NECK:  Supple, JVP not seen, no carotid bruits, no palpable lymphadenopathy, no palpable goiter. CHEST:  No wheezes, few basal crackles. HEART:  Sounds 1 and 2 heard, normal, irregular, bardycardic, no murmurs. ABDOMEN:  Full, soft, non-tender, no palpable organomegaly, no palpable masses, normal bowel sounds. GENITALIA:  Not examined. LOWER EXTREMITIES:  Mild pitting edema, palpable peripheral pulses. MUSCULOSKELETAL SYSTEM:  Generalized osteoarthritic changes, otherwise, normal. CENTRAL NERVOUS SYSTEM:  No focal neurologic deficit on gross examination.  Lab Results:  Recent Labs  06/20/12 0500 06/21/12 0650  WBC 16.4* 12.9*  HGB 8.4* 9.1*  HCT 25.4* 28.0*  PLT 225 201    Recent Labs  06/20/12 0500 06/21/12 0650  NA 141 138  K 4.2 4.8  CL 108 106  CO2 24 21  GLUCOSE 149* 166*  BUN 45* 45*  CREATININE 1.97* 1.75*  CALCIUM 11.9* 12.1*   Recent Results (from the past 240 hour(s))  CULTURE, BLOOD (ROUTINE X 2)     Status: None   Collection Time    06/16/12  6:45 PM      Result Value Range Status   Specimen Description BLOOD RIGHT HAND   Final   Special Requests BOTTLES DRAWN AEROBIC ONLY 1CC   Final   Culture  Setup Time 06/17/2012 02:13   Final   Culture     Final   Value:        BLOOD CULTURE RECEIVED NO GROWTH TO DATE CULTURE WILL BE HELD FOR 5 DAYS BEFORE ISSUING A FINAL NEGATIVE REPORT   Report Status PENDING   Incomplete  CULTURE, BLOOD (ROUTINE X 2)     Status: None   Collection Time    06/16/12  6:52 PM      Result Value Range Status   Specimen Description BLOOD LEFT ARM   Final   Special Requests BOTTLES DRAWN AEROBIC ONLY 4CC   Final   Culture  Setup Time 06/17/2012 02:15   Final   Culture     Final   Value:        BLOOD CULTURE RECEIVED NO GROWTH TO DATE CULTURE WILL BE HELD FOR 5 DAYS BEFORE ISSUING A FINAL NEGATIVE REPORT   Report Status PENDING   Incomplete  MRSA PCR SCREENING     Status: None   Collection Time    06/16/12 10:23 PM       Result Value Range Status   MRSA by PCR NEGATIVE  NEGATIVE Final   Comment:            The GeneXpert MRSA Assay (FDA     approved for NASAL specimens     only), is one component of a     comprehensive MRSA colonization     surveillance program. It is not     intended to diagnose MRSA     infection nor to guide or     monitor treatment for     MRSA infections.  URINE CULTURE     Status: None   Collection Time    06/16/12 10:23 PM      Result Value Range Status   Specimen Description URINE, CATHETERIZED   Final   Special Requests NONE   Final   Culture  Setup Time 06/17/2012 00:10   Final   Colony Count NO GROWTH   Final   Culture NO GROWTH   Final   Report Status 06/18/2012 FINAL   Final  URINE CULTURE     Status:  None   Collection Time    06/18/12  5:51 PM      Result Value Range Status   Specimen Description URINE, CATHETERIZED   Final   Special Requests NONE   Final   Culture  Setup Time 06/19/2012 03:34   Final   Colony Count NO GROWTH   Final   Culture NO GROWTH   Final   Report Status 06/20/2012 FINAL   Final     Studies/Results: Dg Chest 2 View  06/21/2012  *RADIOLOGY REPORT*  Clinical Data: Shortness of breath, follow up CHF.  CHEST - 2 VIEW  Comparison: 06/19/2012.  Findings: Heart is enlarged, as before.  Descending thoracic aorta is tortuous.  Interval improvement in mixed interstitial and airspace disease, bibasilar dependent.  Aeration at the lung bases has improved significantly in the interval.  Small bilateral pleural effusions persist.  Old bilateral rib fractures.  IMPRESSION: Improving congestive heart failure without complete resolution.   Original Report Authenticated By: Leanna Battles, M.D.     Medications: Scheduled Meds: . amiodarone  200 mg Oral Daily  . amLODipine  5 mg Oral Daily  . atorvastatin  20 mg Oral q1800  . dexamethasone  4 mg Intravenous Q12H  . furosemide  20 mg Oral Daily  . insulin aspart  0-20 Units Subcutaneous TID WC  .  metoprolol tartrate  25 mg Oral BID  . pantoprazole  40 mg Oral Q1200  . warfarin  6 mg Oral ONCE-1800  . Warfarin - Pharmacist Dosing Inpatient   Does not apply q1800   Continuous Infusions:  PRN Meds:.albuterol, sodium chloride    LOS: 5 days   Ramsey Guadamuz,CHRISTOPHER  [facility] Pager [phone]. If 8PM-8AM, please contact night-coverage at [email or web address], password [facility] 06/21/2012, 1:47 PM  LOS: 5 days

## 2012-06-21 NOTE — Progress Notes (Signed)
ANTICOAGULATION / ANTIBIOTIC CONSULT NOTE - Follow Up Consult  Pharmacy Consult:  Coumadin Indication:  Atrial fibrillation  No Known Allergies  Patient Measurements: Height: 5' 2.99" (160 cm) Weight: 179 lb 3.2 oz (81.285 kg) IBW/kg (Calculated) : 52.38  Vital Signs: Temp: 97.9 F (36.6 C) (02/12 0419) Temp src: Oral (02/12 0419) BP: 107/66 mmHg (02/12 0419) Pulse Rate: 54 (02/12 0419)  Labs:  Recent Labs  06/19/12 0358 06/20/12 0500 06/21/12 0650  HGB 8.3* 8.4* 9.1*  HCT 25.7* 25.4* 28.0*  PLT 228 225 201  LABPROT 57.6* 20.9* 15.9*  INR 7.38* 1.88* 1.30  CREATININE 2.08* 1.97* 1.75*    Estimated Creatinine Clearance: 29.8 ml/min (by C-G formula based on Cr of 1.75).  Assessment: Pharmacy managing Coumadin for her history of Afib.  INR supra-therapeutic on admission, now reversed with 1 mg PO Vit K.  Last Coumadin dose was 2/7 PTA.  INR continues to drop as expected.  Will increase Coumadin dose.  Pt unsure of home Coumadin regimen - has 5mg  tabs and takes 1/2 tab (2.5 mg) 3-4 x a week, whole tab other days.  No bleeding reported. No new drug interactions noted - pt continues on home amio.   Goal of Therapy:  INR 2-3  Plan:  - Coumadin 6mg  PO x 1 tonight. - Daily PT / INR.  * MD, would you like to bridge patient with heparin or lovenox until INR therapeutic?  * MD, Consider changing Decadron to PO and/or taper dose.  [employer], Pharm.D., BCPS Clinical Pharmacist Pager [phone] 06/21/2012 8:43 AM

## 2012-06-22 LAB — CBC
HCT: 31.1 % — ABNORMAL LOW (ref 36.0–46.0)
Hemoglobin: 9.8 g/dL — ABNORMAL LOW (ref 12.0–15.0)
MCV: 82.5 fL (ref 78.0–100.0)
RBC: 3.77 MIL/uL — ABNORMAL LOW (ref 3.87–5.11)
RDW: 17.7 % — ABNORMAL HIGH (ref 11.5–15.5)
WBC: 11 10*3/uL — ABNORMAL HIGH (ref 4.0–10.5)

## 2012-06-22 LAB — PROTIME-INR: INR: 1.5 — ABNORMAL HIGH (ref 0.00–1.49)

## 2012-06-22 LAB — BASIC METABOLIC PANEL
BUN: 41 mg/dL — ABNORMAL HIGH (ref 6–23)
CO2: 24 mEq/L (ref 19–32)
Chloride: 105 mEq/L (ref 96–112)
Creatinine, Ser: 1.87 mg/dL — ABNORMAL HIGH (ref 0.50–1.10)
GFR calc Af Amer: 30 mL/min — ABNORMAL LOW (ref >90)
Potassium: 4.9 mEq/L (ref 3.5–5.1)

## 2012-06-22 LAB — GLUCOSE, CAPILLARY: Glucose-Capillary: 146 mg/dL — ABNORMAL HIGH (ref 70–99)

## 2012-06-22 MED ORDER — PANTOPRAZOLE SODIUM 40 MG PO TBEC: 40.0000 mg | DELAYED_RELEASE_TABLET | Freq: Every day | ORAL | Status: DC

## 2012-06-22 MED ORDER — WARFARIN SODIUM 6 MG PO TABS
6.0000 mg | ORAL_TABLET | Freq: Once | ORAL | Status: DC
  Filled 2012-06-22: qty 1

## 2012-06-22 MED ORDER — PREDNISONE (PAK) 10 MG PO TABS: ORAL_TABLET | ORAL | Status: DC

## 2012-06-22 NOTE — Progress Notes (Signed)
NUTRITION FOLLOW UP  Intervention:    No nutrition intervention warranted at this time  RD to continue to follow  Nutrition Dx:   Inadequate oral intake, resolved  New Goal:   Oral intake with meals supplements to meet >/= 90% of estimated nutrition needs, met  Monitor:   PO intake, weight, labs, I/O's  Assessment:   Patient extubated 2/9.  Diet advanced s/p extubation.  PO intake 100% per flowsheet records.  Height: Ht Readings from Last 1 Encounters:  06/16/12 5' 2.99" (1.6 m)    Weight Status:   Wt Readings from Last 1 Encounters:  06/22/12 177 lb 4 oz (80.4 kg)    Re-estimated needs:  Kcal: 1700-1900 Protein: 80-90 gm Fluid: 1.8-2.0 L  Skin: Intact  Diet Order: Carb Control   Intake/Output Summary (Last 24 hours) at 06/22/12 1157 Last data filed at 06/22/12 0730  Gross per 24 hour  Intake    840 ml  Output   2450 ml  Net  -1610 ml    Labs:   Recent Labs Lab 06/17/12 0421  06/20/12 0500 06/21/12 0650 06/22/12 0540  NA 135  < > 141 138 139  K 2.5*  < > 4.2 4.8 4.9  CL 100  < > 108 106 105  CO2 18*  < > 24 21 24   BUN 22  < > 45* 45* 41*  CREATININE 2.23*  < > 1.97* 1.75* 1.87*  CALCIUM 10.6*  < > 11.9* 12.1* 12.0*  MG 1.8  --  2.3  --   --   PHOS 2.3  --  2.8  --   --   GLUCOSE 408*  < > 149* 166* 145*  < > = values in this interval not displayed.  CBG (last 3)   Recent Labs  06/21/12 2114 06/22/12 0609 06/22/12 1135  GLUCAP 164* 146* 183*    Scheduled Meds: . amiodarone  200 mg Oral Daily  . atorvastatin  20 mg Oral q1800  . furosemide  20 mg Oral Daily  . insulin aspart  0-20 Units Subcutaneous TID WC  . metoprolol tartrate  25 mg Oral BID  . pantoprazole  40 mg Oral Q1200  . predniSONE  40 mg Oral Q breakfast  . warfarin  6 mg Oral ONCE-1800  . Warfarin - Pharmacist Dosing Inpatient   Does not apply q1800    Continuous Infusions:   Maureen Chatters, RD, LDN Pager #: [phone] After-Hours Pager #: [phone]

## 2012-06-22 NOTE — Progress Notes (Signed)
ANTICOAGULATION / ANTIBIOTIC CONSULT NOTE - Follow Up Consult  Pharmacy Consult:  Coumadin Indication:  Atrial fibrillation  No Known Allergies  Patient Measurements: Height: 5' 2.99" (160 cm) Weight: 177 lb 4 oz (80.4 kg) IBW/kg (Calculated) : 52.38  Vital Signs: Temp: 98.1 F (36.7 C) (02/13 0428) Temp src: Oral (02/13 0428) BP: 151/89 mmHg (02/13 0428) Pulse Rate: 54 (02/13 0428)  Labs:  Recent Labs  06/20/12 0500 06/21/12 0650 06/22/12 0540  HGB 8.4* 9.1* 9.8*  HCT 25.4* 28.0* 31.1*  PLT 225 201 273  LABPROT 20.9* 15.9* 17.7*  INR 1.88* 1.30 1.50*  CREATININE 1.97* 1.75* 1.87*    Estimated Creatinine Clearance: 27.7 ml/min (by C-G formula based on Cr of 1.87).  Assessment: Pharmacy managing Coumadin for her history of Afib.  INR supra-therapeutic on admission, now reversed with 1 mg PO Vit K.  Last Coumadin dose was 2/7 PTA.  Coumadin restarted 2/11 when INR dropped to subtherapeutic range.  INR remain subtherapeutic as expected after several doses held.    Pt unsure of home Coumadin regimen - has 5mg  tabs and takes 1/2 tab (2.5 mg) 3-4 x a week, whole tab other days.  No bleeding reported. No new drug interactions noted - pt continues on home amio.   Goal of Therapy:  INR 2-3  Plan:  - Coumadin 6mg  PO x 1 tonight. - Daily PT / INR.  [employer], Pharm.D., BCPS Clinical Pharmacist Pager [phone] 06/22/2012 9:16 AM

## 2012-06-22 NOTE — Progress Notes (Signed)
Discharged to home with family office visits in place teaching done  

## 2012-06-22 NOTE — Discharge Summary (Addendum)
Physician Discharge Summary  [name] UUV:[id number] DOB: 06-Jan-1941 DOA: 06/16/2012  PCP: No primary provider on file.  Admit date: 06/16/2012 Discharge date: 06/22/2012  Time spent: 40 minutes  Recommendations for Outpatient Follow-up:  1. Follow up with primary MD. 2. Follow up with Dr Rollene Rotunda, cardiologist.  3. Dr Antoine Poche to follow up on results of intact PTH levels, for hypercalcemia.   Discharge Diagnoses:  Principal Problem:   Acute respiratory failure with hypoxia Active Problems:   Acute on chronic diastolic congestive heart failure   CKD (chronic kidney disease)   Diabetes mellitus, type 2   Discharge Condition: Satisfactory.   Diet recommendation: Heart-Healthy/carbohydrate-Modified.   Filed Weights   06/20/12 0517 06/21/12 0419 06/22/12 0428  Weight: 82.4 kg (181 lb 10.5 oz) 81.285 kg (179 lb 3.2 oz) 80.4 kg (177 lb 4 oz)    History of present illness:  72 Y/O F with hx of diastolic CHF, Hypertrophic cardiomyopathy, CAD, Afib on Coumadin, CKD and DMT2 brought to ED on 06/16/12 by EMS after sudden onset of SOB while shopping. On ED was found to have O2 sat on the 50's not responding to Bipap. Was intubated and subsequently developed hypotension and bradycardia. She was admitted to intensive care unit under critical care service. Once she was stabilized, she was transferred to telemetry on 06/19/12.   Hospital Course:  1. Acute respiratory failure: Patient presented with SOB and acute hypoxic respiratory failure secondary to pulmonary edema, stridor and chronic interstitial lung changes. She was admitted to ICU, had ventilatory support 06/16/12-06/18/12, as well as specific measures, with clinical improvement. Serial CXRs have demonstrated interval improvement of CHF. Antibiotics were started empirically but discontinued when it became obvious that pneumonia was unlikely. Parenteral steroids were started for stridor, but with resolution, patient was transitioned to  oral taper.  2. Acute on chronic diastolic CHF: Patient's CHF was decompensated at presentation, against the background of known Hypertrophic Cardiomyopathy, CAD, A fib. She was successfully managed with iv Lasix, and subsequent transitioned to oral Lasix, without deleterious effect.  3 Atrial Fibrillation: Patient remained rate-controlled and even bradycardic on Amiodarone and Metoprolol. She was coagulopathic, with supra-therapeutic INR on presentation. Coumadin was held, to allow INR to drift down. This has now been re-started as of 06/20/12.  4. Stage 4 chronic kidney disease: Baseline creatinine was 1.95 on 04/15/1012. Creatinine at baseline. Following BMP.  5. Type 2 diabetes mellitus: This was apparently diet-controlled, per family. Exacerbated by steroid therapy. Reasonably controlled with SSI during this hospitalization, and further improvement is anticipated as steroids are tapered off.  6. Anemia: Mild, normocytic and stable. Likely due to chronic disease.  7. Lactic acidosis: Secondary to tissue hypoperfusion. Resolved.  8. Hypokalemia: Repleted as indicated.  9. Hypercalcemia: Unclear etiology. Patient was noted incidentally, to have calcium levels, ranging between 11.6-12.1, and albumiin of 3.1. Asymptomatic but will need further workup as outpatient. Intact PTH is pending. Will defer to PMD/Cardiologistr, for follow up.  10. Abnormal LFTs: Mild transaminitis at presentation, with AST 117, ALT 55, Alk Phos 214. Possibly from hepatic congestion, due to CHF. Resolved.  11. HTN: SBP was borderline on 06/21/12 at 107. Have discontinued Amlodipine, and BP has normalized.   Procedures:  See below.   Consultations:  N/A.   Discharge Exam: Filed Vitals:   06/21/12 2000 06/21/12 2116 06/22/12 0428 06/22/12 0900  BP:  116/65 151/89   Pulse: 52 51 54 61  Temp:  97.9 F (36.6 C) 98.1 F (36.7 C)  TempSrc:  Oral Oral   Resp:  20 18   Height:      Weight:   80.4 kg (177 lb 4 oz)    SpO2:  100% 100%    General: Comfortable, alert, communicative, fully oriented, not short of breath at rest.  HEENT: Mild clinical pallor, no jaundice, no conjunctival injection or discharge.  NECK: Supple, JVP not seen, no carotid bruits, no palpable lymphadenopathy, no palpable goiter.  CHEST: No wheezes, nocrackles.  HEART: Sounds 1 and 2 heard, normal, irregular, bardycardic, no murmurs.  ABDOMEN: Full, soft, non-tender, no palpable organomegaly, no palpable masses, normal bowel sounds.  GENITALIA: Not examined.  LOWER EXTREMITIES: Mild pitting edema, palpable peripheral pulses.  MUSCULOSKELETAL SYSTEM: Generalized osteoarthritic changes, otherwise, normal.  CENTRAL NERVOUS SYSTEM: No focal neurologic deficit on gross examination.  Discharge Instructions      Discharge Orders   Future Appointments Provider Department Dept Phone   06/28/2012 9:45 AM Young Berry May, RN, RD, CDE [facility] [phone]   Future Orders Complete By Expires     Diet - low sodium heart healthy  As directed     Diet Carb Modified  As directed     Increase activity slowly  As directed         Medication List    STOP taking these medications       amLODipine 5 MG tablet  Commonly known as:  NORVASC      TAKE these medications       acetaminophen 500 MG tablet  Commonly known as:  TYLENOL  Take 500 mg by mouth 2 (two) times daily as needed. For pain     amiodarone 200 MG tablet  Commonly known as:  PACERONE  Take 200 mg by mouth daily.     furosemide 20 MG tablet  Commonly known as:  LASIX  Take 1 tablet (20 mg total) by mouth daily.     metoprolol tartrate 25 MG tablet  Commonly known as:  LOPRESSOR  Take 25 mg by mouth 2 (two) times daily.     nitroGLYCERIN 0.4 MG SL tablet  Commonly known as:  NITROSTAT  Place 0.4 mg under the tongue every 5 (five) minutes as needed. For chest pain     pantoprazole 40 MG tablet  Commonly known as:   PROTONIX  Take 1 tablet (40 mg total) by mouth daily at 12 noon.     predniSONE 10 MG tablet  Commonly known as:  STERAPRED UNI-PAK  Take 40 mg daily for 3 days, then 30 mg daily for 3 days, then 20 mg daily for 3 days, then 10 mg daily for 3 days, then stop.     rosuvastatin 10 MG tablet  Commonly known as:  CRESTOR  Take 10 mg by mouth at bedtime.     warfarin 5 MG tablet  Commonly known as:  COUMADIN  Take 5 mg by mouth every evening. 1/2 tablet on Thursday (2.5 mg) 3 or 4 days 1/2 tablet, other days 1 tablet - written down on paper at home       Follow-up Information   Schedule an appointment as soon as possible for a visit with Rollene Rotunda, MD.   Contact information:   [address] [place] [address] [phone]       Please follow up. (Follow up with primary MD. )        The results of significant diagnostics from  this hospitalization (including imaging, microbiology, ancillary and laboratory) are listed below for reference.    Significant Diagnostic Studies: Dg Chest 2 View  06/21/2012  *RADIOLOGY REPORT*  Clinical Data: Shortness of breath, follow up CHF.  CHEST - 2 VIEW  Comparison: 06/19/2012.  Findings: Heart is enlarged, as before.  Descending thoracic aorta is tortuous.  Interval improvement in mixed interstitial and airspace disease, bibasilar dependent.  Aeration at the lung bases has improved significantly in the interval.  Small bilateral pleural effusions persist.  Old bilateral rib fractures.  IMPRESSION: Improving congestive heart failure without complete resolution.   Original Report Authenticated By: Leanna Battles, M.D.    Dg Chest 2 View  06/03/2012  *RADIOLOGY REPORT*  Clinical Data: 72 year old female with resolving pulmonary edema/pneumonia.  CHEST - 2 VIEW  Comparison: 06/02/2012 and earlier.  Findings: AP and lateral views of the chest.  Significantly improved perihilar and basilar ventilation. Stable  cardiomegaly and mediastinal contours.  Tortuous thoracic aorta.  No pneumothorax. No definite pleural effusion.  No consolidation. No acute osseous abnormality identified.  IMPRESSION: Significantly improved ventilation since yesterday suggesting resolving pulmonary edema. No new cardiopulmonary abnormality.   Original Report Authenticated By: Erskine Speed, M.D.    Dg Chest Port 1 View  06/19/2012  *RADIOLOGY REPORT*  Clinical Data: Shortness of breath.  Congestive heart failure.  PORTABLE CHEST - 1 VIEW  Comparison: Chest 06/17/2012 and 06/18/2012.  Findings: NG tube and endotracheal tube have been removed.  Left subclavian central venous catheter remains in place.  There has been some increase and bibasilar airspace disease with small effusion is identified.  Cardiomegaly and mild interstitial edema persist although edema shows some mild improvement.  IMPRESSION:  1.  Status post extubation and NG tube removal. 2.  New small bilateral pleural effusions and basilar airspace disease. 3.  Mild improvement in interstitial edema.   Original Report Authenticated By: Holley Dexter, M.D.    Dg Chest Port 1 View  06/18/2012  *RADIOLOGY REPORT*  Clinical Data: Chest pain follow-up chest x-ray  PORTABLE CHEST - 1 VIEW  Comparison: Prior chest x-ray 06/17/2012  Findings: The tip of the endotracheal tube is 4.9 cm above the carina.  The left subclavian approach central venous catheter is in similar position with the tip projecting over the upper superior vena cava.  The nasogastric tube is high.  The tip projects over the GE junction and the proximal side port is within the distal thoracic esophagus.  Slightly increased pulmonary vascular congestion and likely mild interstitial edema.  Stable cardiomegaly with probable left atrial enlargement.  Probable small bilateral effusions.  IMPRESSION:  1.  The tip of the nasogastric tube is in the GE junction and the proximal side port in the distal thoracic esophagus.  If the  intent is to decompress the stomach, recommend advancing 5-10 centimeters for more optimal placement. 2.  Similar to slightly increased pulmonary edema. 3.  Stable cardiomegaly with left atrial enlargement. 4.  Probable small bilateral effusions.   Original Report Authenticated By: Malachy Moan, M.D.    Dg Chest Port 1 View  06/17/2012  *RADIOLOGY REPORT*  Clinical Data: 72 year old female intubated.  PORTABLE CHEST - 1 VIEW  Comparison: 06/16/2012 and earlier.  Findings: Semi upright AP portable view 1024 hours.  Stable endotracheal tube tip in good position between level clavicles and carina.  Enteric tube now placed, side hole in the distal esophagus.  Stable left subclavian central line.   Stable cardiac size and mediastinal contours.  Decreased  interstitial and perihilar opacity with mild to moderate residual. No pneumothorax.  No large effusion or consolidation.  IMPRESSION: 1.  Enteric tube placed, side hole the distal esophagus.  Advanced 8 cm for more optimal placement. 2. Otherwise, stable lines and tubes. 3.  Decreased pulmonary edema.   Original Report Authenticated By: Erskine Speed, M.D.    Dg Chest Port 1 View  06/16/2012  *RADIOLOGY REPORT*  Clinical Data: Line placement  PORTABLE CHEST - 1 VIEW  Comparison: Plain film 06/16/2012 at 17 56  Findings: Interval placement of left central venous line with tip in the distal SVC.  Endotracheal tube unchanged.  Bilateral airspace disease is unchanged.  No pneumothorax.  IMPRESSION: Placement of left central venous line with tip in distal SVC.   Original Report Authenticated By: Genevive Bi, M.D.    Dg Chest Port 1 View  06/16/2012  *RADIOLOGY REPORT*  Clinical Data: Respiratory failure, endotracheal tube placed  PORTABLE CHEST - 1 VIEW  Comparison: Chest x-ray of 06/03/2012  Findings: The tip of the endotracheal is approximately 4.5 cm above the carina.  There is diffuse airspace disease now present most consistent with edema.  Pneumonia cannot  be excluded.  No definite effusion is seen.  Cardiomegaly is stable.  IMPRESSION:  1.  Endotracheal tip 4.5 cm above the carina. 2.  Diffuse airspace disease most consistent with edema.  Cannot exclude pneumonia.   Original Report Authenticated By: Dwyane Dee, M.D.    Dg Chest Portable 1 View  06/02/2012  *RADIOLOGY REPORT*  Clinical Data: Respiratory distress.  History of smoking.  PORTABLE CHEST - 1 VIEW  Comparison: Chest radiograph performed 04/15/2012  Findings: The lungs are well-aerated.  Diffuse bilateral airspace opacification is noted, particularly on the left side.  This raises suspicion for multifocal pneumonia, though asymmetric pulmonary edema might have a similar appearance.  No significant pleural effusion or pneumothorax is seen; trace fluid is seen tracking along the right minor fissure.  The heart is mildly enlarged.  No acute osseous abnormalities are seen.  IMPRESSION: Diffuse bilateral airspace opacification, particularly on the left side.  This raises suspicion for diffuse multifocal pneumonia, though asymmetric pulmonary edema might have a similar appearance. Mild cardiomegaly.   Original Report Authenticated By: Tonia Ghent, M.D.     Microbiology: Recent Results (from the past 240 hour(s))  CULTURE, BLOOD (ROUTINE X 2)     Status: None   Collection Time    06/16/12  6:45 PM      Result Value Range Status   Specimen Description BLOOD RIGHT HAND   Final   Special Requests BOTTLES DRAWN AEROBIC ONLY 1CC   Final   Culture  Setup Time 06/17/2012 02:13   Final   Culture     Final   Value:        BLOOD CULTURE RECEIVED NO GROWTH TO DATE CULTURE WILL BE HELD FOR 5 DAYS BEFORE ISSUING A FINAL NEGATIVE REPORT   Report Status PENDING   Incomplete  CULTURE, BLOOD (ROUTINE X 2)     Status: None   Collection Time    06/16/12  6:52 PM      Result Value Range Status   Specimen Description BLOOD LEFT ARM   Final   Special Requests BOTTLES DRAWN AEROBIC ONLY 4CC   Final   Culture   Setup Time 06/17/2012 02:15   Final   Culture     Final   Value:        BLOOD CULTURE RECEIVED NO GROWTH  TO DATE CULTURE WILL BE HELD FOR 5 DAYS BEFORE ISSUING A FINAL NEGATIVE REPORT   Report Status PENDING   Incomplete  MRSA PCR SCREENING     Status: None   Collection Time    06/16/12 10:23 PM      Result Value Range Status   MRSA by PCR NEGATIVE  NEGATIVE Final   Comment:            The GeneXpert MRSA Assay (FDA     approved for NASAL specimens     only), is one component of a     comprehensive MRSA colonization     surveillance program. It is not     intended to diagnose MRSA     infection nor to guide or     monitor treatment for     MRSA infections.  URINE CULTURE     Status: None   Collection Time    06/16/12 10:23 PM      Result Value Range Status   Specimen Description URINE, CATHETERIZED   Final   Special Requests NONE   Final   Culture  Setup Time 06/17/2012 00:10   Final   Colony Count NO GROWTH   Final   Culture NO GROWTH   Final   Report Status 06/18/2012 FINAL   Final  URINE CULTURE     Status: None   Collection Time    06/18/12  5:51 PM      Result Value Range Status   Specimen Description URINE, CATHETERIZED   Final   Special Requests NONE   Final   Culture  Setup Time 06/19/2012 03:34   Final   Colony Count NO GROWTH   Final   Culture NO GROWTH   Final   Report Status 06/20/2012 FINAL   Final     Labs: Basic Metabolic Panel:  Recent Labs Lab 06/17/12 0421 06/18/12 0358 06/19/12 0358 06/20/12 0500 06/21/12 0650 06/22/12 0540  NA 135 135 139 141 138 139  K 2.5* 4.8 4.6 4.2 4.8 4.9  CL 100 106 107 108 106 105  CO2 18* 22 24 24 21 24   GLUCOSE 408* 127* 158* 149* 166* 145*  BUN 22 28* 36* 45* 45* 41*  CREATININE 2.23* 2.04* 2.08* 1.97* 1.75* 1.87*  CALCIUM 10.6* 11.5* 11.6* 11.9* 12.1* 12.0*  MG 1.8  --   --  2.3  --   --   PHOS 2.3  --   --  2.8  --   --    Liver Function Tests:  Recent Labs Lab 06/16/12 1738 06/21/12 0650  AST 117*  15  ALT 55* 18  ALKPHOS 214* 125*  BILITOT 0.3 0.5  PROT 8.2 6.8  ALBUMIN 3.7 3.1*   No results found for this basename: LIPASE, AMYLASE,  in the last 168 hours No results found for this basename: AMMONIA,  in the last 168 hours CBC:  Recent Labs Lab 06/16/12 1738  06/18/12 0358 06/19/12 0358 06/20/12 0500 06/21/12 0650 06/22/12 0540  WBC 14.7*  < > 24.1* 19.6* 16.4* 12.9* 11.0*  NEUTROABS 8.8*  --   --   --   --   --   --   HGB 11.7*  < > 8.2* 8.3* 8.4* 9.1* 9.8*  HCT 37.2  < > 25.9* 25.7* 25.4* 28.0* 31.1*  MCV 84.2  < > 81.4 82.9 81.7 80.9 82.5  PLT 352  < > 239 228 225 201 273  < > = values in this interval not displayed.  Cardiac Enzymes:  Recent Labs Lab 06/16/12 1739 06/16/12 2312 06/17/12 0421 06/17/12 1116  TROPONINI <0.30 <0.30 <0.30 <0.30   BNP: BNP (last 3 results)  Recent Labs  04/06/12 1957 04/15/12 2000 06/02/12 0148  PROBNP 1727.0* 1866.0* 1173.0*   CBG:  Recent Labs Lab 06/21/12 1121 06/21/12 1633 06/21/12 2114 06/22/12 0609 06/22/12 1135  GLUCAP 169* 111* 164* 146* 183*       Signed:  Kasir Hallenbeck,CHRISTOPHER  [facility] 06/22/2012, 12:19 PM

## 2012-06-23 LAB — CULTURE, BLOOD (ROUTINE X 2): Culture: NO GROWTH

## 2012-06-26 ENCOUNTER — Ambulatory Visit: Payer: Medicare Other | Admitting: Dietician

## 2012-06-28 ENCOUNTER — Encounter: Payer: Medicare Other | Attending: Internal Medicine | Admitting: Dietician

## 2012-06-28 ENCOUNTER — Encounter: Payer: Self-pay | Admitting: Dietician

## 2012-06-28 VITALS — Ht 63.0 in | Wt 180.0 lb

## 2012-06-28 DIAGNOSIS — I509 Heart failure, unspecified: Secondary | ICD-10-CM | POA: Insufficient documentation

## 2012-06-28 DIAGNOSIS — N189 Chronic kidney disease, unspecified: Secondary | ICD-10-CM | POA: Insufficient documentation

## 2012-06-28 DIAGNOSIS — I2510 Atherosclerotic heart disease of native coronary artery without angina pectoris: Secondary | ICD-10-CM | POA: Insufficient documentation

## 2012-06-28 DIAGNOSIS — E119 Type 2 diabetes mellitus without complications: Secondary | ICD-10-CM | POA: Insufficient documentation

## 2012-06-28 DIAGNOSIS — I129 Hypertensive chronic kidney disease with stage 1 through stage 4 chronic kidney disease, or unspecified chronic kidney disease: Secondary | ICD-10-CM | POA: Insufficient documentation

## 2012-06-28 NOTE — Patient Instructions (Addendum)
   Bake your chicken using the Mrs. Dash seasonings, or some of the other herbs.  [employer] sells smaller containers of herbs.    Consider trying the Pepsi One,  Or the Pepsi Next that has 15 gm of carb for the whole can instead of 45 gms. OR keep having half water and half Pepsi.  Try adding 1/2 of the juice to water and then eat a small orange.   I will type up the rest of the recommendations and mail to you.  Use the fruits rather than the juice.  The fruit goes to blood sugar much slower than the juice.  Continue to eat regular meals and snacks.  Try to have some protein at all meals and snacks.  Try to bake, broil, grill, roast and avoid frying.  Try to avoid meats and foods that are breaded, have a gravy or a sauce on them.  The sauce tends to be higher in salt and in carbohydrate that will go to blood sugar.  Use the Potassium Foods List.  Use it in reverse.  Use the high and medium groups of vegetables and fruits rather than the low groups.  Try to continue to monitor your intake of the leafy green vegetables that are high in Vitamin K and affect your blood clotting and Coumadin dose.  Use the plate poster.  Have 1/2 of the plate to be the non-starchy vegetables that  Have little glucose in them.            Have 1/4 of the plate to be a lean, not processed meat, poultry, fish, egg, shell fish that is not fried.           Have 1/4 of the plate to be a starchy vegetable, bread, rice or pasta.  Keep this to 1/2 cup to 2/3 cup.            Have a small piece of fruit on the side as your dessert or sweet item.  Plan to see Dr. Jonny Ruiz and then follow back up with me.  Check your feet every day.  Schedule an eye exam after you see Dr. Jonny Ruiz.  r. Jonny Ruiz.

## 2012-06-28 NOTE — Progress Notes (Signed)
Medical Nutrition Therapy:  Appt start time: 1000 end time:  1115.   Assessment:  Primary concerns today: "Tell me why [name] sent me.  Explain this business about diabetes.  Tell me what foods [name] be making me hold on to fluid."  At her last hospitalization on 06/16/2012, she was found to have an A1C at 8.7%.  She noted that they tried to send me home with insulin, but I told them I did not know anything about insulin and would not be taking it."  She currently is not on any medication for high glucose and is currently completing her Prednisone taper.  Her daughter is the only reported family member with diabetes.   [name] has history of cardipmyopathy, CHF, HTN, MI, Atrial fibrillation, and Chronic Kidney disease at stage 4.  Today, she has gained 3 lbs since her discharge from the hospital on 113/2014.   Her feet and ankles today are a 2-3+ pitting edema. In discussing her food intake, she has questions and many comments related to sodium.  She is reading labels and is trying to limit her sodium intake.  She does miss her ham however.    MEDICATIONS: Med review completed.  On coumadin.  No medications for glucose control.  HYPERGLYCEMIA:  Notes the symptoms of being thirsty, urinary frequency, and being very hungry.  HYPOGLYCEMIA:  No noted S/S for low blood glucose.  BLOOD GLUCOSE MONITORING:  Not currently monitoring  DAILY FOOT SELF EXAMS:  Currently monitoring.  Reviewed the procedure.   DIETARY INTAKE:  Usual eating pattern includes 1-2 meals and 2-4 snacks per day.  Describes self as a picky eater.  Everyday foods include fruits, carbs, meats, vegetables most of the time.  Avoided foods include salty foods, leafy green vegetables with vitamin K.  Notes that " I am craving what I can't have . "  24-hr recall:  B ( AM): 8:00 soft scrambled eggs (1) piece of toast and grits.  OR bacon (2) grits, egg, toast, juice orange    Snk ( AM): +/- a piece of fruit (apples, oranges,  grapes, banana)  L ( PM): Snack of a fruit. Snk ( PM): none D ( PM): 5:30 pancake, glass of water, had lite pancake syrup. Made from a mix about 4 inches in diameter.OR baked chicken Snk ( PM): none Beverages: water, orange juice, regular pepsi.  Usual physical activity: Currently using a walker.  Limited activity.  Estimated energy needs: HT: 63 in  WT: 180 lb  BMI 32  Adj WT: 140 lb  (64 kg) 1200-1300 calories 140-150 g carbohydrates 90-95 g protein 33-35 g fat  Progress Towards Goal(s):  No progress.   Nutritional Diagnosis:  Imperial-2.1 Inpaired nutrition utilization As related to glucose.  As evidenced by diagnosis of type 2 diabetes with a recent A1C at 8.7%..    Intervention:  Nutrition/Recommendations:  Bake your chicken using the Mrs. Dash seasonings, or some of the other herbs.  [employer] sells smaller containers of herbs.    Consider trying the Pepsi One,  Or the Pepsi Next that has 15 gm of carb for the whole can instead of 45 gms. OR keep having half water and half Pepsi.  Try adding 1/2 of the juice to water and then eat a small orange.   I will type up the rest of the recommendations and mail to you.  Use the fruits rather than the juice.  The fruit goes to blood sugar much slower than the  juice.  Continue to eat regular meals and snacks.  Try to have some protein at all meals and snacks.  Try to bake, broil, grill, roast and avoid frying.  Try to avoid meats and foods that are breaded, have a gravy or a sauce on them.  The sauce tends to be higher in salt and in carbohydrate that will go to blood sugar.  Use the Potassium Foods List.  Use it in reverse.  Use the high and medium groups of vegetables and fruits rather than the low groups.  Try to continue to monitor your intake of the leafy green vegetables that are high in Vitamin K and affect your blood clotting and Coumadin dose.  Use the plate poster.  Have 1/2 of the plate to be the non-starchy vegetables  that  Have little glucose in them.            Have 1/4 of the plate to be a lean, not processed meat, poultry, fish, egg, shell fish that is not fried.           Have 1/4 of the plate to be a starchy vegetable, bread, rice or pasta.  Keep this to 1/2 cup to 2/3 cup.            Have a small piece of fruit on the side as your dessert or sweet item.  Plan to see [name] and then follow back up with me.  Check your feet every day.  Schedule an eye exam after you see [name].  Handouts given during visit include:  Living Well with Diabetes  My Plate Poster  Foot Self Exam  List of fruits and vegetables high in Potassium.  Low Sodium Flavoring Tips  Monitoring/Evaluation:  Dietary intake, exercise, and body weight following visit with [name].

## 2012-06-29 ENCOUNTER — Ambulatory Visit: Payer: Self-pay | Admitting: Cardiology

## 2012-06-29 DIAGNOSIS — I4891 Unspecified atrial fibrillation: Secondary | ICD-10-CM

## 2012-07-03 ENCOUNTER — Ambulatory Visit: Payer: Self-pay | Admitting: Cardiovascular Disease

## 2012-07-03 DIAGNOSIS — I4891 Unspecified atrial fibrillation: Secondary | ICD-10-CM

## 2012-07-04 DIAGNOSIS — E119 Type 2 diabetes mellitus without complications: Secondary | ICD-10-CM

## 2012-07-04 DIAGNOSIS — I4891 Unspecified atrial fibrillation: Secondary | ICD-10-CM

## 2012-07-04 DIAGNOSIS — I509 Heart failure, unspecified: Secondary | ICD-10-CM

## 2012-07-04 DIAGNOSIS — I5033 Acute on chronic diastolic (congestive) heart failure: Secondary | ICD-10-CM

## 2012-07-10 ENCOUNTER — Telehealth: Payer: Self-pay | Admitting: Cardiology

## 2012-07-10 NOTE — Telephone Encounter (Signed)
New Problem:    Patient called in because she has a cold and would like to know what she would be able to take over the counter.  Patient would also like to know why she does not have an appetite and would like to know what to do about that also.  Patient has not eaten in about 2-3 daysPlease call back.

## 2012-07-10 NOTE — Telephone Encounter (Signed)
Follow Up from [facility] below:   Pt called back and asked to contact her at [phone].

## 2012-07-10 NOTE — Telephone Encounter (Signed)
Have attempted numerous times to contact the pt and continue to get a fast busy signal.  Will continue to attempt to contact.

## 2012-07-12 ENCOUNTER — Ambulatory Visit: Payer: Self-pay | Admitting: Cardiovascular Disease

## 2012-07-12 DIAGNOSIS — I4891 Unspecified atrial fibrillation: Secondary | ICD-10-CM

## 2012-07-12 LAB — POCT INR: INR: 1.2

## 2012-07-19 ENCOUNTER — Other Ambulatory Visit (HOSPITAL_COMMUNITY): Payer: Self-pay | Admitting: Cardiology

## 2012-07-20 ENCOUNTER — Ambulatory Visit: Payer: Self-pay | Admitting: Cardiovascular Disease

## 2012-07-20 DIAGNOSIS — I4891 Unspecified atrial fibrillation: Secondary | ICD-10-CM

## 2012-07-27 ENCOUNTER — Ambulatory Visit (INDEPENDENT_AMBULATORY_CARE_PROVIDER_SITE_OTHER): Payer: Medicare Other | Admitting: Cardiology

## 2012-07-27 DIAGNOSIS — I4891 Unspecified atrial fibrillation: Secondary | ICD-10-CM

## 2012-07-27 LAB — POCT INR: INR: 3.3

## 2012-07-31 ENCOUNTER — Telehealth: Payer: Self-pay | Admitting: Pharmacist

## 2012-07-31 NOTE — Telephone Encounter (Signed)
Diane RN from [facility] called to let [facility] know pt has been d/c from [facility] services.  [facility] was checking INR and pt was due for INR check 3/27.  I have called pt LMOM to call [facility] to schedule office visit to check INR

## 2012-08-03 ENCOUNTER — Ambulatory Visit (INDEPENDENT_AMBULATORY_CARE_PROVIDER_SITE_OTHER): Payer: Medicare Other | Admitting: *Deleted

## 2012-08-03 DIAGNOSIS — I4891 Unspecified atrial fibrillation: Secondary | ICD-10-CM

## 2012-08-08 ENCOUNTER — Other Ambulatory Visit: Payer: Self-pay | Admitting: Physician Assistant

## 2012-08-16 ENCOUNTER — Other Ambulatory Visit: Payer: Self-pay | Admitting: *Deleted

## 2012-08-16 MED ORDER — PANTOPRAZOLE SODIUM 40 MG PO TBEC: 40.0000 mg | DELAYED_RELEASE_TABLET | Freq: Every day | ORAL | Status: DC

## 2012-08-17 ENCOUNTER — Other Ambulatory Visit: Payer: Self-pay | Admitting: Cardiology

## 2012-08-25 ENCOUNTER — Telehealth: Payer: Self-pay | Admitting: Internal Medicine

## 2012-08-25 NOTE — Telephone Encounter (Signed)
New problem   You do not need to call the caseworker back-she just wanted to inform you of this information:  Calling from [employer]: per patti(case manager)-the pt and her daughter did not understand why she has coumadin appt--please explain to pt the importance and reason why she's getting her INR checked--pt has needs to be reminded that she needs to do weight checks

## 2012-08-25 NOTE — Telephone Encounter (Signed)
Spoke with patient on phone, made her appt for coumadin check with Dr Lindaann Slough appt on 08/31/2012.

## 2012-08-31 ENCOUNTER — Encounter (HOSPITAL_COMMUNITY): Payer: Self-pay | Admitting: *Deleted

## 2012-08-31 ENCOUNTER — Emergency Department (HOSPITAL_COMMUNITY): Payer: Medicare Other

## 2012-08-31 ENCOUNTER — Ambulatory Visit (INDEPENDENT_AMBULATORY_CARE_PROVIDER_SITE_OTHER): Payer: Medicare Other | Admitting: *Deleted

## 2012-08-31 ENCOUNTER — Inpatient Hospital Stay (HOSPITAL_COMMUNITY)
Admission: EM | Admit: 2012-08-31 | Discharge: 2012-09-04 | DRG: 208 | Disposition: A | Discharge status: Home / Self Care | Payer: Medicare Other | Attending: Internal Medicine | Admitting: Internal Medicine

## 2012-08-31 ENCOUNTER — Ambulatory Visit (INDEPENDENT_AMBULATORY_CARE_PROVIDER_SITE_OTHER): Payer: Medicare Other | Admitting: Cardiology

## 2012-08-31 ENCOUNTER — Encounter: Payer: Self-pay | Admitting: Cardiology

## 2012-08-31 VITALS — BP 168/90 | HR 62 | Ht 63.0 in | Wt 178.1 lb

## 2012-08-31 DIAGNOSIS — I4891 Unspecified atrial fibrillation: Secondary | ICD-10-CM

## 2012-08-31 DIAGNOSIS — E119 Type 2 diabetes mellitus without complications: Secondary | ICD-10-CM

## 2012-08-31 DIAGNOSIS — Z7901 Long term (current) use of anticoagulants: Secondary | ICD-10-CM

## 2012-08-31 DIAGNOSIS — Z8614 Personal history of Methicillin resistant Staphylococcus aureus infection: Secondary | ICD-10-CM

## 2012-08-31 DIAGNOSIS — J4489 Other specified chronic obstructive pulmonary disease: Secondary | ICD-10-CM | POA: Diagnosis present

## 2012-08-31 DIAGNOSIS — E876 Hypokalemia: Secondary | ICD-10-CM | POA: Diagnosis present

## 2012-08-31 DIAGNOSIS — Z79899 Other long term (current) drug therapy: Secondary | ICD-10-CM

## 2012-08-31 DIAGNOSIS — J9601 Acute respiratory failure with hypoxia: Secondary | ICD-10-CM

## 2012-08-31 DIAGNOSIS — IMO0001 Reserved for inherently not codable concepts without codable children: Secondary | ICD-10-CM | POA: Diagnosis present

## 2012-08-31 DIAGNOSIS — I10 Essential (primary) hypertension: Secondary | ICD-10-CM

## 2012-08-31 DIAGNOSIS — E210 Primary hyperparathyroidism: Secondary | ICD-10-CM | POA: Diagnosis present

## 2012-08-31 DIAGNOSIS — E785 Hyperlipidemia, unspecified: Secondary | ICD-10-CM | POA: Diagnosis present

## 2012-08-31 DIAGNOSIS — I120 Hypertensive chronic kidney disease with stage 5 chronic kidney disease or end stage renal disease: Secondary | ICD-10-CM | POA: Diagnosis present

## 2012-08-31 DIAGNOSIS — I252 Old myocardial infarction: Secondary | ICD-10-CM

## 2012-08-31 DIAGNOSIS — N185 Chronic kidney disease, stage 5: Secondary | ICD-10-CM

## 2012-08-31 DIAGNOSIS — I509 Heart failure, unspecified: Secondary | ICD-10-CM

## 2012-08-31 DIAGNOSIS — Z8249 Family history of ischemic heart disease and other diseases of the circulatory system: Secondary | ICD-10-CM

## 2012-08-31 DIAGNOSIS — I2510 Atherosclerotic heart disease of native coronary artery without angina pectoris: Secondary | ICD-10-CM

## 2012-08-31 DIAGNOSIS — I5033 Acute on chronic diastolic (congestive) heart failure: Secondary | ICD-10-CM

## 2012-08-31 DIAGNOSIS — I428 Other cardiomyopathies: Secondary | ICD-10-CM

## 2012-08-31 DIAGNOSIS — Z833 Family history of diabetes mellitus: Secondary | ICD-10-CM

## 2012-08-31 DIAGNOSIS — Z87891 Personal history of nicotine dependence: Secondary | ICD-10-CM

## 2012-08-31 DIAGNOSIS — E872 Acidosis, unspecified: Secondary | ICD-10-CM

## 2012-08-31 DIAGNOSIS — I161 Hypertensive emergency: Secondary | ICD-10-CM

## 2012-08-31 DIAGNOSIS — J810 Acute pulmonary edema: Secondary | ICD-10-CM

## 2012-08-31 DIAGNOSIS — J9600 Acute respiratory failure, unspecified whether with hypoxia or hypercapnia: Principal | ICD-10-CM | POA: Diagnosis present

## 2012-08-31 DIAGNOSIS — J449 Chronic obstructive pulmonary disease, unspecified: Secondary | ICD-10-CM

## 2012-08-31 DIAGNOSIS — I421 Obstructive hypertrophic cardiomyopathy: Secondary | ICD-10-CM

## 2012-08-31 DIAGNOSIS — R0902 Hypoxemia: Secondary | ICD-10-CM | POA: Diagnosis present

## 2012-08-31 HISTORY — DX: Enterocolitis due to Clostridium difficile, not specified as recurrent: A04.72

## 2012-08-31 HISTORY — DX: Respiratory failure, unspecified, unspecified whether with hypoxia or hypercapnia: J96.90

## 2012-08-31 HISTORY — DX: Left bundle-branch block, unspecified: I44.7

## 2012-08-31 HISTORY — DX: Chronic kidney disease, stage 4 (severe): N18.4

## 2012-08-31 HISTORY — DX: Hypercalcemia: E83.52

## 2012-08-31 HISTORY — DX: Non-ST elevation (NSTEMI) myocardial infarction: I21.4

## 2012-08-31 HISTORY — DX: Unspecified diastolic (congestive) heart failure: I50.30

## 2012-08-31 LAB — BLOOD GAS, ARTERIAL
Acid-base deficit: 3.3 mmol/L — ABNORMAL HIGH (ref 0.0–2.0)
Drawn by: 34779
FIO2: 0.5 %
TCO2: 22.6 mmol/L (ref 0–100)
pCO2 arterial: 39.8 mmHg (ref 35.0–45.0)
pH, Arterial: 7.349 — ABNORMAL LOW (ref 7.350–7.450)
pO2, Arterial: 101 mmHg — ABNORMAL HIGH (ref 80.0–100.0)

## 2012-08-31 LAB — RAPID URINE DRUG SCREEN, HOSP PERFORMED
Amphetamines: NOT DETECTED
Cocaine: NOT DETECTED
Opiates: NOT DETECTED
Tetrahydrocannabinol: NOT DETECTED

## 2012-08-31 LAB — CBC WITH DIFFERENTIAL/PLATELET
Eosinophils Absolute: 0.2 10*3/uL (ref 0.0–0.7)
HCT: 28.8 % — ABNORMAL LOW (ref 36.0–46.0)
Hemoglobin: 9.4 g/dL — ABNORMAL LOW (ref 12.0–15.0)
Lymphs Abs: 3.3 10*3/uL (ref 0.7–4.0)
MCH: 25.9 pg — ABNORMAL LOW (ref 26.0–34.0)
MCV: 79.3 fL (ref 78.0–100.0)
Monocytes Absolute: 0.5 10*3/uL (ref 0.1–1.0)
Monocytes Relative: 5 % (ref 3–12)
Neutrophils Relative %: 57 % (ref 43–77)
RBC: 3.63 MIL/uL — ABNORMAL LOW (ref 3.87–5.11)

## 2012-08-31 LAB — HEPATIC FUNCTION PANEL
Bilirubin, Direct: 0.1 mg/dL (ref 0.0–0.3)
Indirect Bilirubin: 0.2 mg/dL — ABNORMAL LOW (ref 0.3–0.9)
Total Protein: 6.6 g/dL (ref 6.0–8.3)

## 2012-08-31 LAB — LACTIC ACID, PLASMA: Lactic Acid, Venous: 1.9 mmol/L (ref 0.5–2.2)

## 2012-08-31 LAB — POCT I-STAT, CHEM 8
Calcium, Ion: 1.52 mmol/L — ABNORMAL HIGH (ref 1.13–1.30)
Chloride: 108 mEq/L (ref 96–112)
HCT: 32 % — ABNORMAL LOW (ref 36.0–46.0)
TCO2: 21 mmol/L (ref 0–100)

## 2012-08-31 LAB — POCT I-STAT 3, ART BLOOD GAS (G3+)
Acid-base deficit: 8 mmol/L — ABNORMAL HIGH (ref 0.0–2.0)
Bicarbonate: 18.6 mEq/L — ABNORMAL LOW (ref 20.0–24.0)
Patient temperature: 98.6
TCO2: 20 mmol/L (ref 0–100)

## 2012-08-31 LAB — COMPREHENSIVE METABOLIC PANEL
Alkaline Phosphatase: 166 U/L — ABNORMAL HIGH (ref 39–117)
BUN: 14 mg/dL (ref 6–23)
GFR calc Af Amer: 28 mL/min — ABNORMAL LOW (ref >90)
Glucose, Bld: 308 mg/dL — ABNORMAL HIGH (ref 70–99)
Potassium: 3.1 mEq/L — ABNORMAL LOW (ref 3.5–5.1)
Total Bilirubin: 0.3 mg/dL (ref 0.3–1.2)
Total Protein: 7.5 g/dL (ref 6.0–8.3)

## 2012-08-31 LAB — GLUCOSE, CAPILLARY: Glucose-Capillary: 121 mg/dL — ABNORMAL HIGH (ref 70–99)

## 2012-08-31 LAB — MAGNESIUM: Magnesium: 2 mg/dL (ref 1.5–2.5)

## 2012-08-31 LAB — CG4 I-STAT (LACTIC ACID): Lactic Acid, Venous: 4.4 mmol/L — ABNORMAL HIGH (ref 0.5–2.2)

## 2012-08-31 LAB — PRO B NATRIURETIC PEPTIDE: Pro B Natriuretic peptide (BNP): 2232 pg/mL — ABNORMAL HIGH (ref 0–125)

## 2012-08-31 LAB — PROCALCITONIN: Procalcitonin: 2.16 ng/mL

## 2012-08-31 MED ORDER — FUROSEMIDE 10 MG/ML IJ SOLN: 80.0000 mg | Freq: Once | INTRAMUSCULAR | Status: DC

## 2012-08-31 MED ORDER — AMIODARONE HCL 200 MG PO TABS
200.0000 mg | ORAL_TABLET | Freq: Every day | ORAL | Status: DC
  Administered 2012-09-01 – 2012-09-02 (×2): 200 mg via ORAL
  Filled 2012-08-31 (×3): qty 1

## 2012-08-31 MED ORDER — MIDAZOLAM HCL 2 MG/2ML IJ SOLN: 1.0000 mg | INTRAMUSCULAR | Status: DC | PRN

## 2012-08-31 MED ORDER — NITROGLYCERIN IN D5W 200-5 MCG/ML-% IV SOLN: 10.0000 ug/min | INTRAVENOUS | Status: DC

## 2012-08-31 MED ORDER — INSULIN ASPART 100 UNIT/ML ~~LOC~~ SOLN
0.0000 [IU] | SUBCUTANEOUS | Status: DC
  Administered 2012-08-31: 1 [IU] via SUBCUTANEOUS
  Administered 2012-09-01: 2 [IU] via SUBCUTANEOUS

## 2012-08-31 MED ORDER — PANTOPRAZOLE SODIUM 40 MG IV SOLR
40.0000 mg | Freq: Every day | INTRAVENOUS | Status: DC
  Administered 2012-08-31: 40 mg via INTRAVENOUS
  Filled 2012-08-31 (×3): qty 40

## 2012-08-31 MED ORDER — ASPIRIN 300 MG RE SUPP: 300.0000 mg | RECTAL | Status: DC

## 2012-08-31 MED ORDER — SODIUM CHLORIDE 0.9 % IJ SOLN: 3.0000 mL | INTRAMUSCULAR | Status: DC | PRN

## 2012-08-31 MED ORDER — NITROGLYCERIN IN D5W 200-5 MCG/ML-% IV SOLN
INTRAVENOUS | Status: AC
  Administered 2012-08-31: 10 ug/min via INTRAVENOUS
  Filled 2012-08-31: qty 250

## 2012-08-31 MED ORDER — NOREPINEPHRINE BITARTRATE 1 MG/ML IJ SOLN
2.0000 ug/min | INTRAVENOUS | Status: DC
  Filled 2012-08-31: qty 4

## 2012-08-31 MED ORDER — FUROSEMIDE 10 MG/ML IJ SOLN
80.0000 mg | Freq: Four times a day (QID) | INTRAMUSCULAR | Status: DC
  Administered 2012-09-01 (×2): 80 mg via INTRAVENOUS
  Filled 2012-08-31 (×3): qty 8

## 2012-08-31 MED ORDER — PROPOFOL 10 MG/ML IV BOLUS: 0.5000 mg/kg | Freq: Once | INTRAVENOUS | Status: DC

## 2012-08-31 MED ORDER — PROPOFOL 10 MG/ML IV EMUL
5.0000 ug/kg/min | INTRAVENOUS | Status: DC
  Administered 2012-08-31: 10 ug/kg/min via INTRAVENOUS

## 2012-08-31 MED ORDER — WARFARIN - PHARMACIST DOSING INPATIENT
Freq: Every day | Status: DC
  Administered 2012-09-01: 19:00:00

## 2012-08-31 MED ORDER — MIDAZOLAM HCL 2 MG/2ML IJ SOLN
INTRAMUSCULAR | Status: AC
  Filled 2012-08-31: qty 2

## 2012-08-31 MED ORDER — HEPARIN SODIUM (PORCINE) 5000 UNIT/ML IJ SOLN
5000.0000 [IU] | Freq: Three times a day (TID) | INTRAMUSCULAR | Status: DC
  Administered 2012-08-31 – 2012-09-01 (×2): 5000 [IU] via SUBCUTANEOUS
  Filled 2012-08-31 (×5): qty 1

## 2012-08-31 MED ORDER — FENTANYL CITRATE 0.05 MG/ML IJ SOLN: 25.0000 ug | INTRAMUSCULAR | Status: DC | PRN

## 2012-08-31 MED ORDER — FENTANYL CITRATE 0.05 MG/ML IJ SOLN
50.0000 ug | Freq: Once | INTRAMUSCULAR | Status: AC
  Administered 2012-08-31: 50 ug via INTRAVENOUS
  Filled 2012-08-31: qty 2

## 2012-08-31 MED ORDER — ASPIRIN 81 MG PO CHEW: 324.0000 mg | CHEWABLE_TABLET | ORAL | Status: DC

## 2012-08-31 MED ORDER — ETOMIDATE 2 MG/ML IV SOLN
INTRAVENOUS | Status: AC | PRN
  Administered 2012-08-31: 30 mg via INTRAVENOUS

## 2012-08-31 MED ORDER — POTASSIUM CHLORIDE 10 MEQ/50ML IV SOLN
10.0000 meq | INTRAVENOUS | Status: AC
  Administered 2012-08-31 – 2012-09-01 (×4): 10 meq via INTRAVENOUS
  Filled 2012-08-31 (×4): qty 50

## 2012-08-31 MED ORDER — SODIUM CHLORIDE 0.9 % IV SOLN: 250.0000 mL | INTRAVENOUS | Status: DC | PRN

## 2012-08-31 MED ORDER — MIDAZOLAM HCL 2 MG/2ML IJ SOLN
2.0000 mg | Freq: Once | INTRAMUSCULAR | Status: AC
  Administered 2012-08-31: 2 mg via INTRAVENOUS
  Filled 2012-08-31: qty 2

## 2012-08-31 MED ORDER — AMLODIPINE BESYLATE 5 MG PO TABS: 5.0000 mg | ORAL_TABLET | Freq: Every day | ORAL | Status: DC

## 2012-08-31 MED ORDER — METHYLPREDNISOLONE SODIUM SUCC 125 MG IJ SOLR
80.0000 mg | Freq: Three times a day (TID) | INTRAMUSCULAR | Status: DC
  Administered 2012-08-31: 80 mg via INTRAVENOUS
  Filled 2012-08-31 (×2): qty 1.28

## 2012-08-31 MED ORDER — ASPIRIN 300 MG RE SUPP
300.0000 mg | Freq: Once | RECTAL | Status: DC
  Filled 2012-08-31: qty 1

## 2012-08-31 MED ORDER — SODIUM CHLORIDE 0.9 % IJ SOLN
3.0000 mL | Freq: Two times a day (BID) | INTRAMUSCULAR | Status: DC
  Administered 2012-08-31 – 2012-09-03 (×5): 3 mL via INTRAVENOUS

## 2012-08-31 MED ORDER — PHENYLEPHRINE HCL 10 MG/ML IJ SOLN
30.0000 ug/min | INTRAVENOUS | Status: DC
  Administered 2012-08-31: 30 ug/min via INTRAVENOUS
  Filled 2012-08-31: qty 1

## 2012-08-31 MED ORDER — ASPIRIN 325 MG PO TABS: 325.0000 mg | ORAL_TABLET | Freq: Once | ORAL | Status: DC

## 2012-08-31 MED ORDER — SUCCINYLCHOLINE CHLORIDE 20 MG/ML IJ SOLN
INTRAMUSCULAR | Status: AC | PRN
  Administered 2012-08-31: 125 mg via INTRAVENOUS

## 2012-08-31 MED ORDER — PROPOFOL 10 MG/ML IV EMUL
INTRAVENOUS | Status: AC
  Filled 2012-08-31: qty 100

## 2012-08-31 NOTE — ED Provider Notes (Signed)
History     CSN: [id number]  Arrival date & time 08/31/12  1842   First MD Initiated Contact with Patient 08/31/12 1847      Chief Complaint  Patient presents with  . Shortness of Breath    (Consider location/radiation/quality/duration/timing/severity/associated sxs/prior treatment) Patient is a 72 y.o. female presenting with shortness of breath. The history is provided by the EMS personnel and medical records.  Shortness of Breath Severity:  Severe Onset quality:  Gradual Timing:  Unable to specify Progression:  Unable to specify Chronicity:  Recurrent Context comment:  Walked 75 feet and developed sudden onset of dyspnea Worsened by:  Nothing tried Ineffective treatments:  None tried   Past Medical History  Diagnosis Date  . CAD (coronary artery disease)     cath 11/11: LAD 40%, mid-dist 25-30%; prox CFX 30%, mid 40%; OM 50%; PDA 50%; PLV 50%;  EF 65%  . Acute myocardial infarction     in setting of AFib with RVR 03/2010  . Atrial fibrillation     DCCV 11/17/11; coumadin & amiodarone  . HOCM (hypertrophic obstructive cardiomyopathy)     Echo 11/2011:severe LVH, EF 65-70%, mid-cavity gradient up to  . Hypertension   . Tobacco abuse   . Chronic kidney disease     Stage 4  . CHF (congestive heart failure)     preserved LVF  . Iron deficiency anemia   . Third degree uterine prolapse   . Hx MRSA infection   . Hx of cardiovascular stress test     a. Lex MV 12/13:  EF 56%, no ischemia    . Diabetes   . Hyperlipidemia     Past Surgical History  Procedure Laterality Date  . Tubal ligation    . Cardioversion  11/17/2011    Procedure: CARDIOVERSION;  Surgeon: Hillis Range, MD;  Location: [facility] OR;  Service: Cardiovascular;  Laterality: N/A;    Family History  Problem Relation Age of Onset  . Coronary artery disease Neg Hx   . Atrial fibrillation Neg Hx   . Diabetes Daughter     History  Substance Use Topics  . Smoking status: Former Smoker -- 0.25 packs/day  for 30 years    Types: Cigarettes    Quit date: 02/02/2012  . Smokeless tobacco: Never Used     Comment: quit 01/2012  . Alcohol Use: No    OB History   Grav Para Term Preterm Abortions TAB SAB Ect Mult Living                  Review of Systems  Unable to perform ROS: Acuity of condition  Respiratory: Positive for shortness of breath.     Allergies  Review of patient's allergies indicates no known allergies.  Home Medications   Current Outpatient Rx  Name  Route  Sig  Dispense  Refill  . acetaminophen (TYLENOL) 500 MG tablet   Oral   Take 500 mg by mouth 2 (two) times daily as needed. For pain         . amiodarone (PACERONE) 200 MG tablet   Oral   Take 1 tablet (200 mg total) by mouth daily.   30 tablet   5   . amLODipine (NORVASC) 5 MG tablet   Oral   Take 1 tablet (5 mg total) by mouth daily.   30 tablet   11   . furosemide (LASIX) 20 MG tablet   Oral   Take 1 tablet (20 mg total) by mouth  daily.   30 tablet   1   . nitroGLYCERIN (NITROSTAT) 0.4 MG SL tablet   Sublingual   Place 0.4 mg under the tongue every 5 (five) minutes as needed. For chest pain         . pantoprazole (PROTONIX) 40 MG tablet   Oral   Take 1 tablet (40 mg total) by mouth daily at 12 noon.   30 tablet   6   . predniSONE (STERAPRED UNI-PAK) 10 MG tablet      Take 40 mg daily for 3 days, then 30 mg daily for 3 days, then 20 mg daily for 3 days, then 10 mg daily for 3 days, then stop.   30 tablet   0   . rosuvastatin (CRESTOR) 10 MG tablet   Oral   Take 10 mg by mouth at bedtime.          Marland Kitchen warfarin (COUMADIN) 5 MG tablet   Oral   Take 5 mg by mouth every evening. 1/2 tablet on Thursday (2.5 mg) 3 or 4 days 1/2 tablet, other days 1 tablet - written down on paper at home           BP 155/101  Pulse 103  Resp 16  SpO2 98%  Physical Exam  Nursing note and vitals reviewed. Constitutional: She appears well-developed and well-nourished.  HENT:  Head: Normocephalic  and atraumatic.  Right Ear: External ear normal.  Left Ear: External ear normal.  Nose: Nose normal.  Mouth/Throat: Oropharynx is clear and moist. No oropharyngeal exudate.  Neck: JVD present.  Cardiovascular: Regular rhythm, normal heart sounds and intact distal pulses.  Exam reveals no gallop and no friction rub.   No murmur heard. Tachycardic to the 100's   Pulmonary/Chest: She is in respiratory distress. She has wheezes. She has rales.  Abdominal: Soft. Bowel sounds are normal. She exhibits distension. She exhibits no mass. There is no tenderness. There is no rebound and no guarding.  Morbidly obese  Musculoskeletal: She exhibits edema (2+ bilateral pitting). She exhibits no tenderness.  Neurological:  Obtunded   Skin: Skin is warm and dry.  Psychiatric:  Obtunded     ED Course  INTUBATION Date/Time: 08/31/2012 7:05 PM Performed by: Clemetine Marker Authorized by: Preston Fleeting, DAVID Consent: The procedure was performed in an emergent situation. Patient identity confirmed: arm band and anonymous protocol, patient vented/unresponsive Indications: respiratory distress, respiratory failure and airway protection Intubation method: direct Patient status: paralyzed (RSI) Preoxygenation: nonrebreather mask Sedatives: etomidate Paralytic: succinylcholine Laryngoscope size: Mac 3 Tube size: 7.5 mm Tube type: cuffed Number of attempts: 1 Cricoid pressure: yes Cords visualized: yes Post-procedure assessment: chest rise Breath sounds: equal and absent over the epigastrium Cuff inflated: yes ETT to teeth: 21 cm Tube secured with: ETT holder Chest x-ray interpreted by me. Chest x-ray findings: endotracheal tube in appropriate position Patient tolerance: Patient tolerated the procedure well with no immediate complications.  CENTRAL LINE Date/Time: 08/31/2012 9:00 PM Performed by: Clemetine Marker Authorized by: Max Fickle B Consent: The procedure was performed in an emergent  situation. Patient identity confirmed: arm band and hospital-assigned identification number Indications: vascular access Patient sedated: no Preparation: skin prepped with 2% chlorhexidine Skin prep agent dried: skin prep agent completely dried prior to procedure Sterile barriers: all five maximum sterile barriers used - cap, mask, sterile gown, sterile gloves, and large sterile sheet Hand hygiene: hand hygiene performed prior to central venous catheter insertion Location details: right internal jugular Site selection rationale: Left EJ Patient  position: reverse Trendelenburg Catheter type: triple lumen Catheter size: 7 Fr Pre-procedure: landmarks identified Ultrasound guidance: yes Number of attempts: 1 Successful placement: yes Post-procedure: line sutured Assessment: blood return through all ports and no pneumothorax on x-ray Patient tolerance: Patient tolerated the procedure well with no immediate complications.   (including critical care time)  Labs Reviewed  CBC WITH DIFFERENTIAL - Abnormal; Notable for the following:    RBC 3.63 (*)    Hemoglobin 9.4 (*)    HCT 28.8 (*)    MCH 25.9 (*)    RDW 18.9 (*)    All other components within normal limits  COMPREHENSIVE METABOLIC PANEL - Abnormal; Notable for the following:    Potassium 3.1 (*)    CO2 18 (*)    Glucose, Bld 308 (*)    Creatinine, Ser 1.98 (*)    Calcium 10.8 (*)    Albumin 3.2 (*)    Alkaline Phosphatase 166 (*)    GFR calc non Af Amer 24 (*)    GFR calc Af Amer 28 (*)    All other components within normal limits  PRO B NATRIURETIC PEPTIDE - Abnormal; Notable for the following:    Pro B Natriuretic peptide (BNP) 2232.0 (*)    All other components within normal limits  HEPATIC FUNCTION PANEL - Abnormal; Notable for the following:    Albumin 2.8 (*)    Alkaline Phosphatase 139 (*)    Indirect Bilirubin 0.2 (*)    All other components within normal limits  BLOOD GAS, ARTERIAL - Abnormal; Notable for the  following:    pH, Arterial 7.349 (*)    pO2, Arterial 101.0 (*)    Acid-base deficit 3.3 (*)    All other components within normal limits  URINE RAPID DRUG SCREEN (HOSP PERFORMED) - Abnormal; Notable for the following:    Benzodiazepines POSITIVE (*)    All other components within normal limits  URINALYSIS, ROUTINE W REFLEX MICROSCOPIC - Abnormal; Notable for the following:    Glucose, UA 100 (*)    Hgb urine dipstick MODERATE (*)    Leukocytes, UA MODERATE (*)    All other components within normal limits  GLUCOSE, CAPILLARY - Abnormal; Notable for the following:    Glucose-Capillary 121 (*)    All other components within normal limits  POCT I-STAT, CHEM 8 - Abnormal; Notable for the following:    Potassium 3.2 (*)    Creatinine, Ser 1.80 (*)    Glucose, Bld 305 (*)    Calcium, Ion 1.52 (*)    Hemoglobin 10.9 (*)    HCT 32.0 (*)    All other components within normal limits  CG4 I-STAT (LACTIC ACID) - Abnormal; Notable for the following:    Lactic Acid, Venous 4.40 (*)    All other components within normal limits  POCT I-STAT TROPONIN I - Abnormal; Notable for the following:    Troponin i, poc 0.11 (*)    All other components within normal limits  POCT I-STAT 3, BLOOD GAS (G3+) - Abnormal; Notable for the following:    pH, Arterial 7.286 (*)    Bicarbonate 18.6 (*)    Acid-base deficit 8.0 (*)    All other components within normal limits  MRSA PCR SCREENING  TROPONIN I  MAGNESIUM  PROCALCITONIN  LACTIC ACID, PLASMA  PHOSPHORUS  URINE MICROSCOPIC-ADD ON  TSH  T4, FREE  PTH-RELATED PEPTIDE   Dg Chest Portable 1 View  08/31/2012  *RADIOLOGY REPORT*  Clinical Data: PICC line placement.  Coronary artery disease. Shortness of breath.  PORTABLE CHEST - 1 VIEW  Comparison: 08/31/2012  Findings: Endotracheal tube terminates at the level of clavicles, 4.7 cm above carina.  Nasogastric extends beyond the  inferior aspect of the film.  Right internal jugular line terminates at the  low SVC versus caval atrial junction.  No pneumothorax.  Cardiomegaly accentuated by AP portable technique.  No pleural fluid.  Minimal worsening of moderate interstitial prominence.  IMPRESSION: Right internal jugular line at low SVC versus caval atrial junction.  Retraction of 2 cm would place as well into the low SVC.  Slight worsening in aeration, presumably secondary increased congestive heart failure.  No pneumothorax.   Original Report Authenticated By: Jeronimo Greaves, M.D.    Dg Chest Portable 1 View  08/31/2012  *RADIOLOGY REPORT*  Clinical Data: Respiratory failure and status post intubation.  PORTABLE CHEST - 1 VIEW  Comparison: 06/21/2012  Findings: The patient has been intubated and the endotracheal tube tip is approximately 4 cm above the carina.  A nasogastric tube has also been placed which extends below the diaphragm.  Lungs show diffuse edema, likely reflecting congestive heart failure.  There is stable cardiomegaly.  No large pleural effusions are identified.  IMPRESSION: Satisfactory positioning of endotracheal tube.  Lungs show diffuse CHF and stable cardiomegaly.   Original Report Authenticated By: Irish Lack, M.D.      Date: 08/31/2012  Rate: 104 bpm  Rhythm: sinus tachycardia  QRS Axis: normal  Intervals: QT prolonged  ST/T Wave abnormalities: nonspecific ST changes  Conduction Disutrbances:left bundle branch block  Narrative Interpretation: Sinus tachycardia   Old EKG Reviewed: unchanged   1. Diabetes mellitus, type 2   2. Acute on chronic diastolic congestive heart failure   3. Acute respiratory failure with hypoxia   4. Atrial fibrillation   5. Chronic kidney disease, stage V   6. Coronary atherosclerosis of native coronary artery   7. Lactic acidosis       MDM  71 yo F w/hx of CHF w/retained EF, Atrial Fibrillation, and chronic renal insufficiency presents obtunded and hypoxic. Pt not protecting airway and satting 62% on CPAP, clinically fluid overloaded. Pt  emergently intubated  (see procedure note). CXR confirms good placement. However, pt requiring high PEEP to maintain O2 sats. Clinical picture c/w severe CHF exacerbation and flash pulmonary edema. NTG gtt started (SBP 180's). Propofol later given for sedation (20mg ) and pt became hypotensive (MAP's in the 40's). NTG stopped, PEEP decreased to . Right IJ emergently placed (see procedure note); however, BP responded to decreased PEEP and stoppage of NTG. Critical care to admit.         Clemetine Marker, MD 09/01/12 [phone]

## 2012-08-31 NOTE — ED Provider Notes (Signed)
72 year old female is put in by EMS in respiratory distress. She apparently had sudden onset of difficulty breathing. EMS applied CPAP. They stated that she was conversant when they arrived but has become less and less responsive and is also been hypoxic in spite of CPAP. On arrival, the patient is obtunded. Neck vein distention is noted in the lungs have bibasilar rales with coarse wheezes consistent with cardiac asthma. Heart is tachycardic and regular. Extremities have 1+ edema. Clinical picture is that of acute pulmonary edema. Her old records are reviewed and she was hospitalized for acute pulmonary edema in February of this year and required intubation. In the ED, she is she was is continuing to be hypoxic so she was intubated. Dr. Noe Gens, emergency medicine resident, intubated the patient after rapid sequence induction with etomidate and succinylcholine. I was present for and supervised the entire procedure. He once on a ventilator, she increased her oxygen saturations 92% and a heart rate came down. She will need to be admitted to ICU.  She was placed on a propofol drip and IV nitroglycerin and became hypotensive. People is decreased and the drips were stopped and blood pressure did come up. Central line was inserted by Dr. Noe Gens, emergency medicine resident. This was a right internal jugular line. I was present for the procedure and supervised.   Date: 08/31/2012  Rate: 104  Rhythm: sinus tachycardia  QRS Axis: normal  Intervals: normal  ST/T Wave abnormalities: normal  Conduction Disutrbances:left bundle branch block  Narrative Interpretation: Sinus tachycardia, left bundle branch block. When compared with ECG of 06/16/2012, no significant changes are seen.  Old EKG Reviewed: unchanged  CRITICAL CARE Performed by: AVWUJ,[email or web address]   Total critical care time: 55 minutes  Critical care time was exclusive of separately billable procedures and treating other patients.  Critical care was  necessary to treat or prevent imminent or life-threatening deterioration.  Critical care was time spent personally by me on the following activities: development of treatment plan with patient and/or surrogate as well as nursing, discussions with consultants, evaluation of patient's response to treatment, examination of patient, obtaining history from patient or surrogate, ordering and performing treatments and interventions, ordering and review of laboratory studies, ordering and review of radiographic studies, pulse oximetry and re-evaluation of patient's condition.   I saw and evaluated the patient, reviewed the resident's note and I agree with the findings and plan.   Dione Booze, MD 09/01/12 [phone]

## 2012-08-31 NOTE — Progress Notes (Signed)
HPI The patient presents for followup of cardiomyopathy with preserved ejection fraction. She was in the hospital recently after acute respiratory failure with pulmonary edema.  This was probably resuscitated by a hypertensive urgency. Since going home she's had no acute symptoms. She thinks she feels okay. She was being seen by home health nurse and her blood pressure was well-controlled at bedtime. It's elevated today but she's not sure this is usual. She is complaining because her hair is falling out. She's not complaining of chest pressure, neck or arm discomfort. She's not reporting palpitations, presyncope or syncope. She has had no PND or orthopnea.  No Known Allergies  Current Outpatient Prescriptions  Medication Sig Dispense Refill  . acetaminophen (TYLENOL) 500 MG tablet Take 500 mg by mouth 2 (two) times daily as needed. For pain      . amiodarone (PACERONE) 200 MG tablet Take 1 tablet (200 mg total) by mouth daily.  30 tablet  5  . furosemide (LASIX) 20 MG tablet Take 1 tablet (20 mg total) by mouth daily.  30 tablet  1  . metoprolol tartrate (LOPRESSOR) 25 MG tablet Take 25 mg by mouth 2 (two) times daily.      . nitroGLYCERIN (NITROSTAT) 0.4 MG SL tablet Place 0.4 mg under the tongue every 5 (five) minutes as needed. For chest pain      . pantoprazole (PROTONIX) 40 MG tablet Take 1 tablet (40 mg total) by mouth daily at 12 noon.  30 tablet  6  . predniSONE (STERAPRED UNI-PAK) 10 MG tablet Take 40 mg daily for 3 days, then 30 mg daily for 3 days, then 20 mg daily for 3 days, then 10 mg daily for 3 days, then stop.  30 tablet  0  . rosuvastatin (CRESTOR) 10 MG tablet Take 10 mg by mouth at bedtime.       Marland Kitchen warfarin (COUMADIN) 5 MG tablet Take 5 mg by mouth every evening. 1/2 tablet on Thursday (2.5 mg) 3 or 4 days 1/2 tablet, other days 1 tablet - written down on paper at home       No current facility-administered medications for this visit.    Past Medical History  Diagnosis  Date  . CAD (coronary artery disease)     cath 11/11: LAD 40%, mid-dist 25-30%; prox CFX 30%, mid 40%; OM 50%; PDA 50%; PLV 50%;  EF 65%  . Acute myocardial infarction     in setting of AFib with RVR 03/2010  . Atrial fibrillation     DCCV 11/17/11; coumadin & amiodarone  . HOCM (hypertrophic obstructive cardiomyopathy)     Echo 11/2011:severe LVH, EF 65-70%, mid-cavity gradient up to  . Hypertension   . Tobacco abuse   . Chronic kidney disease     Stage 4  . CHF (congestive heart failure)     preserved LVF  . Iron deficiency anemia   . Third degree uterine prolapse   . Hx MRSA infection   . Hx of cardiovascular stress test     a. Lex MV 12/13:  EF 56%, no ischemia    . Diabetes   . Hyperlipidemia     Past Surgical History  Procedure Laterality Date  . Tubal ligation    . Cardioversion  11/17/2011    Procedure: CARDIOVERSION;  Surgeon: Hillis Range, MD;  Location: [facility] OR;  Service: Cardiovascular;  Laterality: N/A;    ROS:  As stated in the HPI and negative for all other systems.  PHYSICAL EXAM  BP 168/90  Pulse 62  Ht 5\' 3"  (1.6 m)  Wt 178 lb 1.9 oz (80.795 kg)  BMI 31.56 kg/m2 GENERAL:  Well appearing HEENT:  Pupils equal round and reactive, fundi not visualized, oral mucosa unremarkable NECK:  Mild jugular venous distention at 45 degrees, waveform within normal limits, carotid upstroke brisk and symmetric, no bruits, no thyromegaly LYMPHATICS:  No cervical, inguinal adenopathy LUNGS:  Clear to auscultation bilaterally BACK:  No CVA tenderness CHEST:  Unremarkable HEART:  PMI not displaced or sustained,S1 and S2 within normal limits, no S3, no S4, no clicks, no rubs, no murmurs ABD:  Flat, positive bowel sounds normal in frequency in pitch, no bruits, no rebound, no guarding, no midline pulsatile mass, no hepatomegaly, no splenomegaly EXT:  2 plus pulses throughout, mild edema, no cyanosis no clubbing SKIN:  No rashes no nodules NEURO:  Cranial nerves II through  XII grossly intact, motor grossly intact throughout PSYCH:  Cognitively intact, oriented to person place and time   ASSESSMENT AND PLAN  CAD -  The patient has had no new ischemic symptoms since her last stress perfusion study.  We will attempt continued risk reduction.  Chronic diastolic heart failure -  She seems to be euvolemic today though it is often difficult to assess in her symptoms have an acutely. I will check a basic metabolic profile but for now I won't make any changes as below.  HYPERTENSION -  I think her loss would be related to beta blockers. I'm going to switch to Norvasc 5 mg daily. She's instructed to keep a blood pressure diary.  TOBACCO ABUSE -  She will continue to abstain from cigarettes.  FIBRILLATION, ATRIAL -  She remains on anticoagulation and amiodarone.  I will continue meds as listed.  CKD -  I will check a BMET today.   DIABETES - She unfortunately is not being followed by her primary provider but she's working on this.

## 2012-08-31 NOTE — ED Notes (Signed)
Critical i-STAT cTnl and i-STAT CG4+ was shown to Dr. Preston Fleeting.

## 2012-08-31 NOTE — ED Notes (Signed)
Family in with pt 

## 2012-08-31 NOTE — Procedures (Signed)
Arterial Catheter Insertion Procedure Note [name] [id number] Mar 24, 1941  Procedure: Insertion of Arterial Catheter  Indications: Blood pressure monitoring  Procedure Details Consent: Unable to obtain consent because of emergent medical necessity. Time Out: Verified patient identification, verified procedure, site/side was marked, verified correct patient position, special equipment/implants available, medications/allergies/relevent history reviewed, required imaging and test results available.  Performed  Maximum sterile technique was used including antiseptics, cap, gloves, gown, hand hygiene, mask and sheet. Skin prep: Chlorhexidine; local anesthetic administered 22 gauge catheter was inserted into left radial artery using the Seldinger technique.  Evaluation Blood flow good; BP tracing good. Complications: No apparent complications.   [name] M 08/31/2012

## 2012-08-31 NOTE — Patient Instructions (Addendum)
Please stop Metoprolol Start Amlodipine 5 mg a day Continue all other medications as listed  Please have blood work today (BMP)  Follow up in 2 months with Tereso Newcomer, PA

## 2012-08-31 NOTE — ED Notes (Signed)
Sudden onset SOB, audible wheezing, resp distress. EMS placed on CPAPm, albuterol 5mg  neb given. EMS reports pt became unresponsive upon arrival to ED, but was able to speak initially

## 2012-08-31 NOTE — Progress Notes (Signed)
Support for daughter, [name], and her fiance.  Patient's other daughter Velna Hatchet is also a patient here at [facility]. Patient and [name] were coming to see her when patient complained that she couldn't breath. Stayed with daughter and facilitated communication flow between daughter and staff. Daughter was also concerned about mother's jewelry (especially her rings). Made sure her daughter got all this belongings. Daughter has also recently had a stroke and said she is also battling cancer. Encouraged self care. Presence; prayer.

## 2012-08-31 NOTE — Progress Notes (Signed)
Pt transported from ED to 2103 on vent. No complications.

## 2012-08-31 NOTE — Progress Notes (Signed)
ANTICOAGULATION CONSULT NOTE - Initial Consult  Pharmacy Consult for Warfarin Indication: Hx of Afib  No Known Allergies  Patient Measurements: Height: 5\' 3"  (160 cm) Weight: 178 lb 2.1 oz (80.8 kg) IBW/kg (Calculated) : 52.4  Vital Signs: Temp: 99 F (37.2 C) (04/24 2135) BP: 83/55 mmHg (04/24 2135) Pulse Rate: 57 (04/24 2135)  Labs:  Recent Labs  08/31/12 1536 08/31/12 1908 08/31/12 1931  HGB  --  9.4* 10.9*  HCT  --  28.8* 32.0*  PLT  --  271  --   INR 2.7  --   --   CREATININE  --  1.98* 1.80*    Estimated Creatinine Clearance: 28.9 ml/min (by C-G formula based on Cr of 1.8).   Medical History: Past Medical History  Diagnosis Date  . CAD (coronary artery disease)     cath 11/11: LAD 40%, mid-dist 25-30%; prox CFX 30%, mid 40%; OM 50%; PDA 50%; PLV 50%;  EF 65%  . Acute myocardial infarction     in setting of AFib with RVR 03/2010  . Atrial fibrillation     DCCV 11/17/11; coumadin & amiodarone  . HOCM (hypertrophic obstructive cardiomyopathy)     Echo 11/2011:severe LVH, EF 65-70%, mid-cavity gradient up to  . Hypertension   . Tobacco abuse   . Chronic kidney disease     Stage 4  . CHF (congestive heart failure)     preserved LVF  . Iron deficiency anemia   . Third degree uterine prolapse   . Hx MRSA infection   . Hx of cardiovascular stress test     a. Lex MV 12/13:  EF 56%, no ischemia    . Diabetes   . Hyperlipidemia     Assessment: 72 y.o. F who presented to the [facility] on 08/31/12 with SOB that progressed into acute respiratory failure requiring intubation. Pharmacy was consulted to resume the patient's home warfarin for hx Afib.   PTA the patient was known to be taking 2.5 mg daily EXCEPT for 5 mg on Mondays only. Since the patient was emergently intubated -- she is unable to verify when the last dose was taken. Family is also unable to verify this since the patient lives alone. INR upon admission is therapeutic (2.7, goal of 2-3). Discussed  with CCM team -- will hold dose this evening to avoid duplicity in case it was taken already today.  Goal of Therapy:  INR 2-3   Plan:  1. Hold warfarin dose this evening 2. Daily PT/INR 3. Will continue to monitor for any signs/symptoms of bleeding and will follow up with PT/INR in the a.m.   Georgina Pillion, PharmD, BCPS Clinical Pharmacist Pager: [phone] 08/31/2012 9:48 PM

## 2012-08-31 NOTE — ED Notes (Signed)
After 2mg  of Versed and of Fentanyl pt's BP dropped to 60/36.  Notified critical care MDs.  Obtained verbal order for art line.

## 2012-08-31 NOTE — ED Notes (Signed)
Pt's pressure dropped in low 50's.  Stopped sedation and nitro.  ED Resident Noe Gens notified.

## 2012-08-31 NOTE — Progress Notes (Signed)
Called by RN to suction pt. Suctioned pt and collected moderate bloody thick secretions. Pt vitals are HR 60 RR 14 SPO2 99%.

## 2012-08-31 NOTE — H&P (Signed)
PULMONARY  / CRITICAL CARE MEDICINE  Name: [name] MRN: [id number] DOB: 07-Dec-1940    ADMISSION DATE:  08/31/2012  REFERRING MD :  Dr. Preston Fleeting PRIMARY SERVICE: PCCM  CHIEF COMPLAINT:  Shortness of breath  BRIEF PATIENT DESCRIPTION:  [name] is a 72 year old woman with PMH of DM2, A. Fib on amio and warfarin, CKD, CAD, and chronic diastolic HF, presenting with acute pulmonary edema with acute respiratory failure requiring intubation. She had similar presentation on 06/16/12 also requiring intubation for acute respiratory distress.   SIGNIFICANT EVENTS / STUDIES:  4/24 Intubation, admission to ICU 4/24 CXR diffuse bilateral infiltrates   LINES / TUBES: ETT 4/24>>> R IJ 4/24>> Foley 4/24>> Art line 4/24>>  CULTURES: NA  ANTIBIOTICS: NA  HISTORY OF PRESENT ILLNESS:   [name] is a 72 year old woman with PMH of diastolic CHF, A.fib on anticoagulation with warfarin and rate controlled with amiodaraone, CKD, DM2, and CAD who presented to the [facility] via EMS for acute shortness of breath. The patient is currently intubated and sedated with no family at her bedside and the information in this HPI is provided by her ED physician and by medical chart review. [name] was hospitalized on 06/16/12 with acute respiratory failure requiring intubation with gradual improvement and with discharge to home on 06/22/12. Except for complaint of recent ongoing hair loss she was in her usual state of health and was seen on the afternoon prior to her admission for a hospital follow up visit visit with her Cardiologist but developed sudden increased shortness of breath while walking from her doctor's office to her car. Her shortness of breath progressively worsened and EMS was called. EMS placed her on CPAP with albuterol nebulizer en route. She became unresponsive upon her arrival to the ED with worsening respiratory distress with pH of 7.28, pCO2 39, pO2 86, bicarb 18 and increased work of breathing  requiring emergent intubation. Immediately following intubation she became hypotensive with SBP in 50s but this resolved on its own with SBP back to 120s. Her CXR is remarkable for diffuse pulmonary edema, her proBNP is elevated to 2232, she has bilateral wheezing and crackles on physical exam. She is on Lasix 20mg  daily at home but medication compliance is questionable.    PAST MEDICAL HISTORY :  Past Medical History  Diagnosis Date  . CAD (coronary artery disease)     cath 11/11: LAD 40%, mid-dist 25-30%; prox CFX 30%, mid 40%; OM 50%; PDA 50%; PLV 50%;  EF 65%  . Acute myocardial infarction     in setting of AFib with RVR 03/2010  . Atrial fibrillation     DCCV 11/17/11; coumadin & amiodarone  . HOCM (hypertrophic obstructive cardiomyopathy)     Echo 11/2011:severe LVH, EF 65-70%, mid-cavity gradient up to  . Hypertension   . Tobacco abuse   . Chronic kidney disease     Stage 4  . CHF (congestive heart failure)     preserved LVF  . Iron deficiency anemia   . Third degree uterine prolapse   . Hx MRSA infection   . Hx of cardiovascular stress test     a. Lex MV 12/13:  EF 56%, no ischemia    . Diabetes   . Hyperlipidemia    Past Surgical History  Procedure Laterality Date  . Tubal ligation    . Cardioversion  11/17/2011    Procedure: CARDIOVERSION;  Surgeon: Hillis Range, MD;  Location: [facility] OR;  Service: Cardiovascular;  Laterality: N/A;   Prior to Admission medications   Medication Sig Start Date End Date Taking? Authorizing Provider  acetaminophen (TYLENOL) 500 MG tablet Take 500 mg by mouth 2 (two) times daily as needed for pain.    Yes Historical Provider, MD  amiodarone (PACERONE) 200 MG tablet Take 1 tablet (200 mg total) by mouth daily. 08/17/12  Yes Rollene Rotunda, MD  amLODipine (NORVASC) 5 MG tablet Take 1 tablet (5 mg total) by mouth daily. 08/31/12 08/31/13 Yes Rollene Rotunda, MD  furosemide (LASIX) 20 MG tablet Take 1 tablet (20 mg total) by mouth daily. 06/05/12   Yes Kathlen Mody, MD  nitroGLYCERIN (NITROSTAT) 0.4 MG SL tablet Place 0.4 mg under the tongue every 5 (five) minutes as needed for chest pain.    Yes Historical Provider, MD  pantoprazole (PROTONIX) 40 MG tablet Take 1 tablet (40 mg total) by mouth daily at 12 noon. 08/16/12  Yes Rollene Rotunda, MD  predniSONE (STERAPRED UNI-PAK) 10 MG tablet 10-40 mg daily. Take 40 mg daily for 3 days, then 30 mg daily for 3 days, then 20 mg daily for 3 days, then 10 mg daily for 3 days, then stop; Start date & duration unknown 06/22/12  Yes Laveda Norman, MD  rosuvastatin (CRESTOR) 10 MG tablet Take 10 mg by mouth at bedtime.    Yes Historical Provider, MD  warfarin (COUMADIN) 5 MG tablet Take 5 mg by mouth every evening. 1/2 tablet on Thursday (2.5 mg) 3 or 4 days 1/2 tablet, other days 1 tablet - written down on paper at home   Yes Historical Provider, MD   No Known Allergies  FAMILY HISTORY:  Family History  Problem Relation Age of Onset  . Coronary artery disease Neg Hx   . Atrial fibrillation Neg Hx   . Diabetes Daughter    SOCIAL HISTORY:  reports that she quit smoking about 6 months ago. Her smoking use included Cigarettes. She has a 7.5 pack-year smoking history. She has never used smokeless tobacco. She reports that she does not drink alcohol or use illicit drugs.  REVIEW OF SYSTEMS:  Unable to obtain  SUBJECTIVE:  Intubated, on vent Intermittent hypotension  VITAL SIGNS: Pulse Rate:  [62-146] 65 (04/24 2013) Resp:  [16-42] 30 (04/24 2013) BP: (50-189)/(33-127) 50/33 mmHg (04/24 2000) SpO2:  [65 %-100 %] 95 % (04/24 2013) FiO2 (%):  [40 %-100 %] 40 % (04/24 2013) Weight:  [178 lb 1.9 oz (80.795 kg)-178 lb 2.1 oz (80.8 kg)] 178 lb 2.1 oz (80.8 kg) (04/24 1855) HEMODYNAMICS:   VENTILATOR SETTINGS: Vent Mode:  [-] PRVC FiO2 (%):  [40 %-100 %] 40 % Set Rate:  [12 bmp] 12 bmp Vt Set:  [530 mL] 530 mL PEEP:  [5 cmH20-10 cmH20] 5 cmH20 Plateau Pressure:  [9 cmH20-18 cmH20] 18 cmH20 INTAKE /  OUTPUT: Intake/Output   None     PHYSICAL EXAMINATION: General:  Sedated, on ventilator Neuro:  RASS -3 HEENT:  ETT in place with tinge of blood, alopecia. Normal size pupils, sluggish but reactive to light bilaterally Cardiovascular:  s1, s2, RRR Lungs:  Bibasilar lung crackles, coarse breath sounds, no wheezing Abdomen:  Soft, non distended, +BS  Musculoskeletal:  1-2+ pitting edema bilaterally up to her knees GU: uterine prolapse Skin:  Intact, dry  LABS:  Recent Labs Lab 08/31/12 1536 08/31/12 1908 08/31/12 1931 08/31/12 1959  HGB  --  9.4* 10.9*  --   WBC  --  9.3  --   --  PLT  --  271  --   --   NA  --  136 141  --   K  --  3.1* 3.2*  --   CL  --  104 108  --   CO2  --  18*  --   --   GLUCOSE  --  308* 305*  --   BUN  --  14 15  --   CREATININE  --  1.98* 1.80*  --   CALCIUM  --  10.8*  --   --   AST  --  25  --   --   ALT  --  12  --   --   ALKPHOS  --  166*  --   --   BILITOT  --  0.3  --   --   PROT  --  7.5  --   --   ALBUMIN  --  3.2*  --   --   INR 2.7  --   --   --   LATICACIDVEN  --   --  4.40*  --   PROBNP  --  2232.0*  --   --   PHART  --   --   --  7.286*  PCO2ART  --   --   --  39.0  PO2ART  --   --   --  86.0   No results found for this basename: GLUCAP,  in the last 168 hours  CXR: 4/24 Bilateral patchy infiltrates, ETT in adequate position, R IJ in   ASSESSMENT / PLAN:  PULMONARY A: Acute hypoxemic respiratory failure - likely 2/2 pulmonary edema Less likely possible superimposed amiodarone toxicity?  COPD?, heavy smoking history      P:   Repeat ABG post intubation Consider ARDS protocol if infiltrates do improve with diuresis Solumedrol x1, listen for wheezing   CARDIOVASCULAR A:  Labile BP, initially hypertensive, then hypotension related to propofol, Versed, and Fentanyl administration      Acute on chronic HF- Last 2D echo on 2/914 with EF 65-70%, grade diastolic dysfunction, calcified mitral valve, pulmonary HTN with peak  pressure of     Tachycardia - EKG with sinus tach with aberrancy      Hypertension - baseline      A. Fib - currently sinus tach      Elevated lactic acid- likely 2/2 hypoperfusino P:  Liver function with PT/INR Hold amiodarone Troponin I q6h x3 Repeat EKG in AM Warfarin per pharmacy Levophed to MAP >65  Lasix when BP improved Repeat lactic acid  RENAL A:  CKD, baseline creatine with wide range of 1.8 to 2.4. Cr on presentation at 1.8      Hypokalemia      Metabolic acidosis, and non anion gap acidosis (AG 14)- lactic acidosis, CKD P:   Renal function panel in AM Repeat ABG Check Mg Repleting K  GASTROINTESTINAL A:  No active issues P:   Monitor for GI bleed on warfarin PT/INR in AM protonix   HEMATOLOGIC A:  Anemia - Hgb stable, close to her baseline      Warfarin anticoagulation - INR of 2.7 on 4/24 P:  PT/INR in AM CBC in AM  INFECTIOUS A:  No active issues      P:   CBC in AM  ENDOCRINE A:  Uncontrolled DM2 - Last Hgb A1C 8.7% on 04/2012       Possible hyperthyroid       Primary hyperparathyroidism  Ca elevated to 11.4,  PTH elevated to 727 P:   CBG q4h SSI TSH, fT4  NEUROLOGIC A:  Sedated - Rass -3 P:   Sedation protocol while on vent Versed, Fentanyl PRN  UDS   TODAY'S SUMMARY: VDRF 2/2 pulmonary edema, on vent, hypotensive, on Levophed. Lasix once blood pressure stable.   Attending:  I have seen and examined the patient with nurse practitioner/resident and agree with the note above.    I have personally obtained a history, examined the patient, evaluated laboratory and imaging results, formulated the assessment and plan and placed orders.  CRITICAL CARE: The patient is critically ill with multiple organ systems failure and requires high complexity decision making for assessment and support, frequent evaluation and titration of therapies, application of advanced monitoring technologies and extensive interpretation of multiple  databases. Critical Care Time devoted to patient care services described in this note is 60 minutes.    Fonnie Jarvis Pulmonary and Critical Care Medicine [facility] Pager: [phone]  08/31/2012, 8:31 PM

## 2012-08-31 NOTE — ED Notes (Signed)
RT in with pt placing arterial line.

## 2012-09-01 ENCOUNTER — Encounter (HOSPITAL_COMMUNITY): Payer: Self-pay | Admitting: Physician Assistant

## 2012-09-01 ENCOUNTER — Inpatient Hospital Stay (HOSPITAL_COMMUNITY): Payer: Medicare Other

## 2012-09-01 DIAGNOSIS — I2510 Atherosclerotic heart disease of native coronary artery without angina pectoris: Secondary | ICD-10-CM

## 2012-09-01 DIAGNOSIS — J9600 Acute respiratory failure, unspecified whether with hypoxia or hypercapnia: Principal | ICD-10-CM

## 2012-09-01 DIAGNOSIS — I4891 Unspecified atrial fibrillation: Secondary | ICD-10-CM

## 2012-09-01 DIAGNOSIS — J449 Chronic obstructive pulmonary disease, unspecified: Secondary | ICD-10-CM | POA: Diagnosis present

## 2012-09-01 DIAGNOSIS — J810 Acute pulmonary edema: Secondary | ICD-10-CM

## 2012-09-01 DIAGNOSIS — I421 Obstructive hypertrophic cardiomyopathy: Secondary | ICD-10-CM

## 2012-09-01 DIAGNOSIS — I422 Other hypertrophic cardiomyopathy: Secondary | ICD-10-CM

## 2012-09-01 DIAGNOSIS — I509 Heart failure, unspecified: Secondary | ICD-10-CM

## 2012-09-01 LAB — COMPREHENSIVE METABOLIC PANEL
ALT: 14 U/L (ref 0–35)
CO2: 24 mEq/L (ref 19–32)
Calcium: 11.4 mg/dL — ABNORMAL HIGH (ref 8.4–10.5)
Chloride: 105 mEq/L (ref 96–112)
Creatinine, Ser: 2.18 mg/dL — ABNORMAL HIGH (ref 0.50–1.10)
GFR calc Af Amer: 25 mL/min — ABNORMAL LOW (ref >90)
GFR calc non Af Amer: 22 mL/min — ABNORMAL LOW (ref >90)
Glucose, Bld: 211 mg/dL — ABNORMAL HIGH (ref 70–99)
Sodium: 140 mEq/L (ref 135–145)
Total Bilirubin: 0.5 mg/dL (ref 0.3–1.2)

## 2012-09-01 LAB — TROPONIN I
Troponin I: 0.31 ng/mL (ref <0.30)
Troponin I: <0.3 ng/mL (ref <0.30)

## 2012-09-01 LAB — RENAL FUNCTION PANEL
Albumin: 3.1 g/dL — ABNORMAL LOW (ref 3.5–5.2)
Calcium: 11.1 mg/dL — ABNORMAL HIGH (ref 8.4–10.5)
GFR calc Af Amer: 29 mL/min — ABNORMAL LOW (ref >90)
GFR calc non Af Amer: 25 mL/min — ABNORMAL LOW (ref >90)
Phosphorus: 2.3 mg/dL (ref 2.3–4.6)
Sodium: 140 mEq/L (ref 135–145)

## 2012-09-01 LAB — URINE MICROSCOPIC-ADD ON

## 2012-09-01 LAB — URINALYSIS, ROUTINE W REFLEX MICROSCOPIC
Glucose, UA: 100 mg/dL — AB
Protein, ur: NEGATIVE mg/dL
pH: 6.5 (ref 5.0–8.0)

## 2012-09-01 LAB — BASIC METABOLIC PANEL
BUN: 15 mg/dL (ref 6–23)
CO2: 24 mEq/L (ref 19–32)
Chloride: 108 mEq/L (ref 96–112)
GFR: 33.23 mL/min — ABNORMAL LOW (ref >60.00)
Glucose, Bld: 109 mg/dL — ABNORMAL HIGH (ref 70–99)
Potassium: 4 mEq/L (ref 3.5–5.1)
Sodium: 139 mEq/L (ref 135–145)

## 2012-09-01 LAB — CBC
HCT: 26.7 % — ABNORMAL LOW (ref 36.0–46.0)
MCH: 25.4 pg — ABNORMAL LOW (ref 26.0–34.0)
MCHC: 32.6 g/dL (ref 30.0–36.0)
MCV: 78.1 fL (ref 78.0–100.0)
Platelets: 240 10*3/uL (ref 150–400)
RBC: 3.42 MIL/uL — ABNORMAL LOW (ref 3.87–5.11)
RDW: 18.5 % — ABNORMAL HIGH (ref 11.5–15.5)

## 2012-09-01 LAB — TSH: TSH: 1.432 u[IU]/mL (ref 0.350–4.500)

## 2012-09-01 LAB — GLUCOSE, CAPILLARY: Glucose-Capillary: 206 mg/dL — ABNORMAL HIGH (ref 70–99)

## 2012-09-01 LAB — T4, FREE: Free T4: 1.41 ng/dL (ref 0.80–1.80)

## 2012-09-01 MED ORDER — INSULIN ASPART 100 UNIT/ML ~~LOC~~ SOLN
0.0000 [IU] | Freq: Three times a day (TID) | SUBCUTANEOUS | Status: DC
  Administered 2012-09-01: 1 [IU] via SUBCUTANEOUS
  Administered 2012-09-01: 2 [IU] via SUBCUTANEOUS
  Administered 2012-09-01: 3 [IU] via SUBCUTANEOUS
  Administered 2012-09-02: 1 [IU] via SUBCUTANEOUS
  Administered 2012-09-02: 2 [IU] via SUBCUTANEOUS
  Administered 2012-09-02 – 2012-09-03 (×2): 1 [IU] via SUBCUTANEOUS
  Administered 2012-09-03: 2 [IU] via SUBCUTANEOUS
  Administered 2012-09-03 – 2012-09-04 (×2): 1 [IU] via SUBCUTANEOUS

## 2012-09-01 MED ORDER — POTASSIUM CHLORIDE 20 MEQ/15ML (10%) PO LIQD
ORAL | Status: AC
  Filled 2012-09-01: qty 30

## 2012-09-01 MED ORDER — WARFARIN SODIUM 5 MG PO TABS
5.0000 mg | ORAL_TABLET | Freq: Once | ORAL | Status: AC
  Administered 2012-09-01: 5 mg via ORAL
  Filled 2012-09-01: qty 1

## 2012-09-01 MED ORDER — POTASSIUM CHLORIDE 20 MEQ/15ML (10%) PO LIQD
ORAL | Status: AC
  Filled 2012-09-01: qty 15

## 2012-09-01 MED ORDER — FUROSEMIDE 10 MG/ML IJ SOLN
40.0000 mg | Freq: Two times a day (BID) | INTRAMUSCULAR | Status: DC
  Administered 2012-09-01: 40 mg via INTRAVENOUS

## 2012-09-01 MED ORDER — CHLORHEXIDINE GLUCONATE 0.12 % MT SOLN
15.0000 mL | Freq: Two times a day (BID) | OROMUCOSAL | Status: DC
  Administered 2012-09-01: 15 mL via OROMUCOSAL

## 2012-09-01 MED ORDER — CHLORHEXIDINE GLUCONATE 0.12 % MT SOLN
OROMUCOSAL | Status: AC
  Filled 2012-09-01: qty 15

## 2012-09-01 MED ORDER — METOPROLOL TARTRATE 25 MG/10 ML ORAL SUSPENSION
25.0000 mg | Freq: Two times a day (BID) | ORAL | Status: DC
  Filled 2012-09-01 (×2): qty 10

## 2012-09-01 MED ORDER — BIOTENE DRY MOUTH MT LIQD
15.0000 mL | Freq: Four times a day (QID) | OROMUCOSAL | Status: DC
  Administered 2012-09-01: 15 mL via OROMUCOSAL

## 2012-09-01 MED ORDER — METOPROLOL TARTRATE 25 MG PO TABS
25.0000 mg | ORAL_TABLET | Freq: Two times a day (BID) | ORAL | Status: DC
  Filled 2012-09-01 (×2): qty 1

## 2012-09-01 MED ORDER — HYDRALAZINE HCL 20 MG/ML IJ SOLN
5.0000 mg | Freq: Three times a day (TID) | INTRAMUSCULAR | Status: DC | PRN
  Administered 2012-09-01: 5 mg via INTRAVENOUS
  Filled 2012-09-01: qty 1

## 2012-09-01 MED ORDER — DILTIAZEM HCL 30 MG PO TABS
30.0000 mg | ORAL_TABLET | Freq: Three times a day (TID) | ORAL | Status: DC
  Administered 2012-09-01 – 2012-09-04 (×9): 30 mg via ORAL
  Filled 2012-09-01 (×12): qty 1

## 2012-09-01 NOTE — Consult Note (Signed)
CARDIOLOGY CONSULT NOTE  Patient ID: [name], MRN: [id number], DOB/AGE: March 04, 1941 72 y.o. Admit date: 08/31/2012   Date of Consult: 09/01/2012 Primary Physician: Default, Provider, MD Primary Cardiologist: Hochrein  Chief Complaint: shortness of breath Reason for Consult: CHF, HOCM  HPI: [name] is a 72 y/o F with history of diastolic CHF and HOCM, atrial fibrillation on amiodarone/Coumadin, CKD stage IV, nonobstructive CAD 2011, previous transient LBBB, and several prior admissions for VDRF for pulmonary edema who presented to [facility] with acute pulmonary edema and acute respiratory distress requiring intubation. She had a recent admission 06/16/2012 for the same. She was seen by Dr. Antoine Poche in the office yesterday morning - she felt well at this appointment. It was recommended that her BB be changed to Norvasc due to hair loss; she had not yet started this. She was actually planning to come up to the hospital to visit a family member when she developed sudden SOB. EMS was called and they applied CPAP. She was reportedly conversant when they arrived but became less responsive and thus required intubation in the ED. She reports vomiting in the ER. She had not eaten anything since breakfast. She was initially hypertensive, but following intubation she became hypotensive with SBP in the 50's, related to propofol, versed, NTG and fentanyl administration. This resolved when these medications were turned off. Initial CXR with diffuse CHF. She received several doses of IV Lasix overnight. Troponins 0.11 (POC)->0.30->0.31->0.30, Lactic acid initially 4.4, pBNP 2200, Cr 1.98, INR therapeutic, WBC initially normal then 11.1 today. Procalcitonin rose to 13. She has since been extubated. Vitals are stable. She denies any dizziness, syncope, or chest pain.  Review of ECGs reveal initial sinus tach (18:58) with atypical LBBB, followed by a tachycardic regular rhythm 147bpm with atypical LBBB  pattern (19:34). This morphology is similar to her 06/2012 ECG. Most recent ECG shows NSR with LVH and repol abnormality and prolonged QT at 529 with resolution of prior LBBB.  Past Medical History  Diagnosis Date  . CAD (coronary artery disease)     a. Nonobst by cath 11/11: LAD 40%, mid-dist 25-30%; prox CFX 30%, mid 40%; OM 50%; PDA 50%; PLV 50%;  EF 65%.  . NSTEMI (non-ST elevated myocardial infarction)     a. In setting of AFib with RVR 03/2010, type 2. b. Again in 11/2011 felt 2/2 afib.  . Atrial fibrillation     a. DCCV 11/17/11. b. On coumadin & amiodarone.  Marland Kitchen HOCM (hypertrophic obstructive cardiomyopathy)     a. Echo 11/2011:severe LVH, EF 65-70%, mid-cavity gradient up to . b. Echo 06/2012: EF 65-70%, severe LVH, grade 2 d/dysf.  Marland Kitchen Hypertension   . Tobacco abuse   . CKD (chronic kidney disease), stage IV   . Diastolic CHF     preserved LVF  . Iron deficiency anemia   . Third degree uterine prolapse   . Hx MRSA infection   . Hx of cardiovascular stress test     a. Lex MV 12/13:  EF 56%, no ischemia    . Diabetes   . Hyperlipidemia   . Respiratory failure     Multiple admissions for resp failure requiring intubation.  . Hypercalcemia   . LBBB (left bundle branch block)     History of transient LBBB  . C. difficile colitis 01/2012      Most Recent Cardiac Studies: 2D Echo 06/18/12 Study Conclusions - Left ventricle: The cavity size was normal. There was severe concentric hypertrophy. Systolic function  was vigorous. The estimated ejection fraction was in the range of 65% to 70%. Wall motion was normal; there were no regional wall motion abnormalities. Features are consistent with a pseudonormal left ventricular filling pattern, with concomitant abnormal relaxation and increased filling pressure (grade 2 diastolic dysfunction). - Mitral valve: Calcified annulus. - Pulmonary arteries: Systolic pressure was mildly to moderately increased. PA peak pressure: 49mm Hg  (S). Impressions: - Compared to the prior study, there has been no significant interval change.  Cardiac Cath 03/2010 FINDINGS: 1. Hemodynamics:  LV 111/21, aorta 114/77. 2. Left ventriculography:  EF was estimated at 65% and was vigorous. 3. Right coronary artery:  The right coronary artery was a dominant  vessel.  There was a long 50% stenosis in the PDA and a long 50% stenosis in the PLV. 4. Left main:  The left main was short with no significant disease. 5. Left circumflex system:  There was a moderate-sized ramus with a 30% proximal stenosis and 40% mid stenosis.  The circumflex itself gave off a large first obtuse marginal that was diffusely diseased including about 50% midvessel stenosis.  The continuation of the AV circumflex beyond the first obtuse marginal was diffusely severely diseased. Compared to the prior study in August, it did appear worse.  However, in August there was still diffuse disease present in the continuation of the AV circumflex beyond the first obtuse marginal. 6. LAD system:  There was a moderate-sized first diagonal with minimal disease.  There was a 40% LAD stenosis after the first diagonal. The remainder of the LAD had diffuse 25-30% stenoses throughout the mid-to-distal LAD. IMPRESSION:  The patient has diffuse mild-to-moderate coronary disease. There is no severe discrete stenosis and no culprit lesion.  The continuation of the AV circumflex beyond the first obtuse marginal had severe diffuse disease.  However, this was present to a lesser degree on the catheterization in August 2011.  I do not believe this is the source of her symptoms today.  Ejection fraction is preserved.  Heart rate was in the 40s during the procedure with the appearance of heart block.  We placed a temporary transvenous pacemaker.  Once the temporary transvenous pacemaker was placed, we were able to titrate the patient off dopamine.  We set the rate at 70 with good capture with a threshold of about  0.9 mA.  We will plan on restarting the heparin drip given her atrial fibrillation 6 hours post-sheath pull.   Surgical History:  Past Surgical History  Procedure Laterality Date  . Tubal ligation    . Cardioversion  11/17/2011    Procedure: CARDIOVERSION;  Surgeon: Hillis Range, MD;  Location: [facility] OR;  Service: Cardiovascular;  Laterality: N/A;     Home Meds: Prior to Admission medications   Medication Sig Start Date End Date Taking? Authorizing Provider  acetaminophen (TYLENOL) 500 MG tablet Take 500 mg by mouth 2 (two) times daily as needed for pain.    Yes Historical Provider, MD  amiodarone (PACERONE) 200 MG tablet Take 1 tablet (200 mg total) by mouth daily. 08/17/12  Yes Rollene Rotunda, MD  amLODipine (NORVASC) 5 MG tablet Take 1 tablet (5 mg total) by mouth daily. 08/31/12 08/31/13 Yes Rollene Rotunda, MD  furosemide (LASIX) 20 MG tablet Take 1 tablet (20 mg total) by mouth daily. 06/05/12  Yes Kathlen Mody, MD  nitroGLYCERIN (NITROSTAT) 0.4 MG SL tablet Place 0.4 mg under the tongue every 5 (five) minutes as needed for chest pain.    Yes Historical  Provider, MD  pantoprazole (PROTONIX) 40 MG tablet Take 1 tablet (40 mg total) by mouth daily at 12 noon. 08/16/12  Yes Rollene Rotunda, MD  predniSONE (STERAPRED UNI-PAK) 10 MG tablet 10-40 mg daily. Take 40 mg daily for 3 days, then 30 mg daily for 3 days, then 20 mg daily for 3 days, then 10 mg daily for 3 days, then stop; Start date & duration unknown 06/22/12  Yes Laveda Norman, MD  rosuvastatin (CRESTOR) 10 MG tablet Take 10 mg by mouth at bedtime.    Yes Historical Provider, MD  warfarin (COUMADIN) 5 MG tablet Take 2.5-5 mg by mouth daily. The patient takes 1/2 tablet (2.5 mg) daily EXCEPT for 1 tablet (5 mg) on Mondays only.   Yes Historical Provider, MD    Inpatient Medications:  . amiodarone  200 mg Oral Daily  . antiseptic oral rinse  15 mL Mouth Rinse QID  . aspirin  300 mg Rectal Once  . chlorhexidine  15 mL Mouth Rinse BID  .  furosemide  40 mg Intravenous BID  . insulin aspart  0-9 Units Subcutaneous TID AC & HS  . metoprolol tartrate  25 mg Oral BID  . sodium chloride  3 mL Intravenous Q12H  . warfarin  5 mg Oral ONCE-1800  . Warfarin - Pharmacist Dosing Inpatient   Does not apply q1800    Allergies: No Known Allergies  History   Social History  . Marital Status: Single    Spouse Name: N/A    Number of Children: N/A  . Years of Education: N/A   Occupational History  . Not on file.   Social History Main Topics  . Smoking status: Former Smoker -- 0.25 packs/day for 30 years    Types: Cigarettes    Quit date: 02/02/2012  . Smokeless tobacco: Never Used     Comment: quit 01/2012  . Alcohol Use: No  . Drug Use: No  . Sexually Active: No   Other Topics Concern  . Not on file   Social History Narrative   [place]  She quit smoking 2 mos ago.     Family History  Problem Relation Age of Onset  . Coronary artery disease Neg Hx   . Atrial fibrillation Neg Hx   . Diabetes Daughter      Review of Systems: General: negative for chills, fever Cardiovascular: see above Dermatological: negative for rash Respiratory: negative for cough or wheezing Urologic: negative for hematuria Abdominal: negative for diarrhea, bright red blood per rectum, melena, or hematemesis Neurologic: negative for visual changes, syncope, or dizziness All other systems reviewed and are otherwise negative except as noted above.  Labs:  Recent Labs  08/31/12 2120 09/01/12 0245 09/01/12 0845  TROPONINI <0.30 0.31* <0.30   Lab Results  Component Value Date   WBC 11.1* 09/01/2012   HGB 8.7* 09/01/2012   HCT 26.7* 09/01/2012   MCV 78.1 09/01/2012   PLT 240 09/01/2012     Recent Labs Lab 08/31/12 2126 09/01/12 0500  NA  --  140  K  --  3.6  CL  --  107  CO2  --  23  BUN  --  17  CREATININE  --  1.95*  CALCIUM  --  11.1*  PROT 6.6  --   BILITOT 0.3  --   ALKPHOS 139*  --   ALT 10  --    AST 20  --   GLUCOSE  --  168*   Lab Results  Component Value Date   CHOL 152 10/12/2010   HDL 60.90 10/12/2010   LDLCALC 74 10/12/2010   TRIG 85.0 10/12/2010   Radiology/Studies:  Dg Chest Port 1 View 09/01/2012  *RADIOLOGY REPORT*  Clinical Data: Respiratory failure  PORTABLE CHEST - 1 VIEW  Comparison: Yesterday  Findings: Endotracheal tube, NG tube, right internal jugular central venous catheter are stable.  Diffuse airspace disease has cleared with some residual disease left greater than right.  Lungs remain under aerated.  Cardiomegaly.  No pneumothorax.  IMPRESSION: Improving bilateral airspace disease.   Original Report Authenticated By: Jolaine Click, M.D.    Dg Chest Portable 1 View 08/31/2012  *RADIOLOGY REPORT*  Clinical Data: PICC line placement.  Coronary artery disease. Shortness of breath.  PORTABLE CHEST - 1 VIEW  Comparison: 08/31/2012  Findings: Endotracheal tube terminates at the level of clavicles, 4.7 cm above carina.  Nasogastric extends beyond the  inferior aspect of the film.  Right internal jugular line terminates at the low SVC versus caval atrial junction.  No pneumothorax.  Cardiomegaly accentuated by AP portable technique.  No pleural fluid.  Minimal worsening of moderate interstitial prominence.  IMPRESSION: Right internal jugular line at low SVC versus caval atrial junction.  Retraction of 2 cm would place as well into the low SVC.  Slight worsening in aeration, presumably secondary increased congestive heart failure.  No pneumothorax.   Original Report Authenticated By: Jeronimo Greaves, M.D.    Dg Chest Portable 1 View 08/31/2012  *RADIOLOGY REPORT*  Clinical Data: Respiratory failure and status post intubation.  PORTABLE CHEST - 1 VIEW  Comparison: 06/21/2012  Findings: The patient has been intubated and the endotracheal tube tip is approximately 4 cm above the carina.  A nasogastric tube has also been placed which extends below the diaphragm.  Lungs show diffuse edema, likely  reflecting congestive heart failure.  There is stable cardiomegaly.  No large pleural effusions are identified.  IMPRESSION: Satisfactory positioning of endotracheal tube.  Lungs show diffuse CHF and stable cardiomegaly.   Original Report Authenticated By: Irish Lack, M.D.    EKG: see hpi  Physical Exam: Blood pressure 110/59, pulse 70, temperature 98.8 F (37.1 C), temperature source Oral, resp. rate 29, height 5\' 3"  (1.6 m), weight 178 lb 2.1 oz (80.8 kg), SpO2 99.00%. General: Well developed AAF in no acute distress. Head: Normocephalic, atraumatic, sclera non-icteric, no xanthomas, nares are without discharge.  Neck: Bilateral neck IVs in place. Lungs: Clear bilaterally to auscultation without wheezes, rales, or rhonchi. Breathing is unlabored. Heart: RRR with S1 S2. No murmurs, rubs, or gallops appreciated. Abdomen: Soft, non-tender, non-distended with normoactive bowel sounds. No hepatomegaly. No rebound/guarding. No obvious abdominal masses. Msk:  Strength and tone appear normal for age. Extremities: No clubbing or cyanosis. No edema.  Distal pedal pulses are 2+ and equal bilaterally. Neuro: Alert and oriented X 3. No facial asymmetry. No focal deficit. Moves all extremities spontaneously. Psych:  Responds to questions appropriately with a normal affect.   Assessment and Plan:   1. Recurrent acute hypoxic respiratory failure requiring transient ventilation 2. Acute pulmonary edema/acute on chronic diastolic CHF/hypertrophic obstructive cardiomyopathy 3. Wide complex tachycardia with history of transient LBBB, likely sinus tach 4. History of atrial fibrillation, on coumadin and amiodarone 5. QTc prolongation 6. CKD stage IV 7. Nonobstructive CAD by cath 2011, nonischemic MV 04/2012 8. Lactic acidosis 9. HTN with transient hypotension 10. History of iron deficiency anemia 11. Hypercalcemia  Patient seen and  examined with Dr. Tenny Craw. Please see below for plan.  Signed, Dayna  Dunn PA-C 09/01/2012, 1:16 PM   Patient seen and examined  Agree with findings of D Dunn above.  Briefly, pt has a history of mild CAD by cath in 2011.  Also a history of severe LVH with mid cavity outflow obstruction.  She had had episodes of pulmonary edema in past, most recently in Feb The patient was in usual state of health yesterday  Drinking and eating as she usually does.  Seen by Daiva Nakayama in clinic.  COmplained of hair loss  Metoprolol d/c'd and Rx for Norvasc given though she has not taken Went home  Was coiming to hosp to see friend when she became severely SOB.  EMS called. In ED intubated.  Given NTG with sudden drop in BP.  Stopped  EKG with transient WCT, probable sinus tachycardai  She has since diuresed and has been extubated.  Trop peak at 0.31.    On exam she is comfortable. Neck= unable to assess JVP due to lines.  Lung are rel clear  Cardiac exam shows RRR.  Ext  No edema.  EKG with ST with LBBB.  Latest EKG with narrow QRS  QTc prolonged at  552 msec Prelim echo shows severe LVH with mid cavity outflow obstruction. (gradient 100 mm Hg)  LVEF greater than 60%  I am not sure why patient became so SOB at home yesterday  [facility] has never sensed palpitations  Wonder if she had rhythm problem  She does not sense palpitations but never has.  I would avoid nitrates, hydralazine --drugs that decrease afterload.  Norvasc also could make hemodynamcis worse by increasing outflow obstruction  I would try low dose diltiazem 30 q 8 hours and follow BP (use instead of b blocker) i would keep on amiodarone  Do not want

## 2012-09-01 NOTE — Progress Notes (Signed)
Patient placed on tbar 28% patient tolerating well at this time HR-84 100% RR-22 133/60 Ventilator at standby by bedside

## 2012-09-01 NOTE — Progress Notes (Signed)
UR Completed.  [name] [phone] 09/01/2012

## 2012-09-01 NOTE — Progress Notes (Signed)
Echocardiogram 2D Echocardiogram has been performed.  [name] 09/01/2012, 1:51 PM

## 2012-09-01 NOTE — Progress Notes (Signed)
CRITICAL VALUE ALERT  Critical value received:  Trop  Date of notification:  09/01/2012   Time of notification:  6:33 AM   Critical value read back:yes  Nurse who received alert:  Fredricka Bonine, Hamilton Capri   MD notified (1st page):  Dr. Darrick Penna  Time of first page:  6:34 AM

## 2012-09-01 NOTE — Progress Notes (Signed)
Quick Note:  Preliminary report reviewed by triage nurse and sent to MD desk. ______ 

## 2012-09-01 NOTE — Procedures (Signed)
Extubation Procedure Note  Patient Details:   Name: [name] DOB: [birth year] MRN: [id number]   Airway Documentation:     Evaluation  O2 sats: stable throughout Complications: No apparent complications Patient did tolerate procedure well. Bilateral Breath Sounds: Clear;Diminished Suctioning: Airway Yes  Patient's airway and mouth suctioned, extubated and placed on 2LNC, HR-81 100% 122/62 RR-18. Patient has bilateral breathsounds and no stridor present. RT will continue to monitor  Adolm Joseph 09/01/2012, 10:30 AM

## 2012-09-01 NOTE — Progress Notes (Addendum)
PULMONARY  / CRITICAL CARE MEDICINE  Name: [name] MRN: [id number] DOB: May 20, 1940    ADMISSION DATE:  08/31/2012  REFERRING MD :  Dr. Preston Fleeting PRIMARY SERVICE: PCCM  CHIEF COMPLAINT:  Shortness of breath  BRIEF PATIENT DESCRIPTION:  [name] is a 72 year old woman with PMH of DM2, A. Fib on amio and warfarin, CKD, CAD, and chronic diastolic HF, presenting with acute pulmonary edema with acute respiratory failure requiring intubation. She had similar presentation on 06/16/12 also requiring intubation for acute respiratory distress.   SIGNIFICANT EVENTS / STUDIES:  4/24 Intubation, admission to ICU 4/24 CXR diffuse bilateral infiltrates  4/25 extubated   LINES / TUBES: ETT 4/24>>>4/25 R IJ 4/24>> Foley 4/24>>4/25 Art line 4/24>>4/25   CULTURES: Urine Cx 4/25>> Resp Asp cx 4/25>> MRSA Screen - Positive    ANTIBIOTICS: None    SUBJECTIVE:  Alert and cooperative. Obeys commands. Denies pain.  VITAL SIGNS: Temp:  [99 F (37.2 C)] 99 F (37.2 C) (04/24 2200) Pulse Rate:  [56-146] 58 (04/25 0600) Resp:  [14-42] 18 (04/25 0600) BP: (50-189)/(33-127) 111/63 mmHg (04/25 0600) SpO2:  [65 %-100 %] 100 % (04/25 0600) Arterial Line BP: (120-203)/(62-96) 135/67 mmHg (04/25 0600) FiO2 (%):  [40 %-100 %] 40 % (04/25 0350) Weight:  [178 lb 1.9 oz (80.795 kg)-178 lb 2.1 oz (80.8 kg)] 178 lb 2.1 oz (80.8 kg) (04/24 1855) HEMODYNAMICS: CVP:  [3 mmHg-7 mmHg] 5 mmHg VENTILATOR SETTINGS: Vent Mode:  [-] PRVC FiO2 (%):  [40 %-100 %] 40 % Set Rate:  [12 bmp-16 bmp] 16 bmp Vt Set:  [420 mL-530 mL] 420 mL PEEP:  [5 cmH20-10 cmH20] 5 cmH20 Plateau Pressure:  [9 cmH20-19 cmH20] 13 cmH20 INTAKE / OUTPUT: Intake/Output     04/24 0701 - 04/25 0700 04/25 0701 - 04/26 0700   I.V. (mL/kg) 160 (2)    IV Piggyback 200    Total Intake(mL/kg) 360 (4.5)    Urine (mL/kg/hr) 1800    Total Output 1800     Net -1440            PHYSICAL EXAMINATION: General:  Alert and interactive.   Neuro:  RASS 0 HEENT:  ETT in place with moderate oral secretions. Normal size pupils. Head normocephalic and atraumatic.  Cardiovascular:  s1, s2, RRR, JVD not raised. Lungs:  CTA, no wheezing, equal air entry bilaterally Abdomen:  Soft, non distended, +BS  Musculoskeletal:  1-2+ pitting edema bilaterally up to her knees Skin:  Intact, dry  LABS:  Recent Labs Lab 08/31/12 1536  08/31/12 1908 08/31/12 1931 08/31/12 1959 08/31/12 2120 08/31/12 2126 08/31/12 2127 08/31/12 2132 08/31/12 2330 09/01/12 0245 09/01/12 0500  HGB  --   --  9.4* 10.9*  --   --   --   --   --   --   --  8.7*  WBC  --   --  9.3  --   --   --   --   --   --   --   --  11.1*  PLT  --   --  271  --   --   --   --   --   --   --   --  240  NA  --   --  136 141  --   --   --   --   --   --   --  140  K  --   < > 3.1* 3.2*  --   --   --   --   --   --   --  3.6  CL  --   --  104 108  --   --   --   --   --   --   --  107  CO2  --   --  18*  --   --   --   --   --   --   --   --  23  GLUCOSE  --   --  308* 305*  --   --   --   --   --   --   --  168*  BUN  --   --  14 15  --   --   --   --   --   --   --  17  CREATININE  --   --  1.98* 1.80*  --   --   --   --   --   --   --  1.95*  CALCIUM  --   --  10.8*  --   --   --   --   --   --   --   --  11.1*  MG  --   --   --   --   --   --  2.0  --   --   --   --   --   PHOS  --   --   --   --   --   --  2.6  --   --   --   --  2.3  AST  --   --  25  --   --   --  20  --   --   --   --   --   ALT  --   --  12  --   --   --  10  --   --   --   --   --   ALKPHOS  --   --  166*  --   --   --  139*  --   --   --   --   --   BILITOT  --   --  0.3  --   --   --  0.3  --   --   --   --   --   PROT  --   --  7.5  --   --   --  6.6  --   --   --   --   --   ALBUMIN  --   --  3.2*  --   --   --  2.8*  --   --   --   --  3.1*  INR 2.7  --   --   --   --   --   --   --   --   --   --  2.09*  LATICACIDVEN  --   --   --  4.40*  --   --   --   --  1.9  --   --   --   TROPONINI   --   --   --   --   --  <0.30  --   --   --   --  0.31*  --   PROCALCITON  --   --   --   --   --   --   --  2.16  --   --   --   --  PROBNP  --   --  2232.0*  --   --   --   --   --   --   --   --   --   PHART  --   --   --   --  7.286*  --   --   --   --  7.349*  --   --   PCO2ART  --   --   --   --  39.0  --   --   --   --  39.8  --   --   PO2ART  --   --   --   --  86.0  --   --   --   --  101.0*  --   --   < > = values in this interval not displayed.  Recent Labs Lab 08/31/12 2352 09/01/12 0353  GLUCAP 121* 149*    CXR: 4/24 Bilateral patchy infiltrates, ETT in adequate position, R IJ in   ASSESSMENT / PLAN: Principal Problem:   Acute respiratory failure with hypoxia Active Problems:   HYPERTENSION   CHRONIC KIDNEY DISEASE STAGE V   CARDIOMYOPATHY, IDIOPATHIC HYPERTROPHIC   CAD   Acute on chronic diastolic congestive heart failure   Diabetes mellitus, type 2   COPD (chronic obstructive pulmonary disease)   PULMONARY A: Acute hypoxemic respiratory failure - likely 2/2 pulmonary edema Less likely possible superimposed amiodarone toxicity?  COPD?, heavy smoking history      P:   Repeat ABG post intubation with improvement PH 7.286 >>7.349 PCXR this AM- improved aeration after IV lasix 80 mg 2 doses. UOP 1.8L Continue with lasix  Hold Solumedrol, no wheezing   BDs   CARDIOVASCULAR A:  Baseline Hypertension. Labile BP, initially hypertensive, then hypotension related to propofol, Versed, and Fentanyl administration. 2D echo 2/14; EF 65-70%, grade 2DD, calcified mitral valve, pulmonary HTN with peak pressure of 49 mmHg. A. Fibrillation. Lactic acid 4.4 >>1.9. LQTc. Normal LFT, elevated alk phosphatase.  BB discontinued at outpatient due to hair loss. P:  Hold amiodarone Continue to trend Troponin. Negative >0.31 EKG in AM, in NSR, LQTc (520) Warfarin per pharmacy, INR 2.09 Continue with Lasix as above If BP continues to be high, restart Amilodipine  Hydralazine  PRN  ASA Restarted Lopressor 25 mg bid  Will consult cardiology   RENAL A:  CKD, baseline creatine with wide range of 1.8 to 2.4. Cr on presentation at 1.8 >>1.95      Hypokalemia 3.2 >>3.6, normal magnesium      Metabolic acidosis, and non anion gap acidosis (AG 14)- lactic acidosis, CKD P:   Trend BMET Acidosis improving Replete K as needed.  GASTROINTESTINAL A:  No active issues P:   Monitor for GI bleed on warfarin protonix   HEMATOLOGIC A:  Chronic normocytic -normochromic Anemia. Baseline Hgb of 9, on admission Hgb 10.9>>8.7. No active bleeding. INR of 2.7 on 4/24 >>2.09.  P:  Coumadin  CBC trend    INFECTIOUS A:  No active issues. Procalcitonin level is normal. No leukocytosis. MRSA positive on screening. Elevated PCT 2.16>>13.29 ? Source of infection UTI Vs respiratory      P:   Monitor CBC Urine culture  Respiratory aspirate culture  Hold ABX as of yet  ENDOCRINE A:  Uncontrolled DM2 - Last Hgb A1C 8.7% on 04/2012       TSH 1.432, normal free T4       Primary hyperparathyroidism Ca elevated to  11.4,  PTH elevated to 727       Likely related to CKD        P:   CBG q4h SSI   NEUROLOGIC A:  Sedated - Rass 0 P:   Off sedation     TODAY'S SUMMARY: VDRF 2/2 pulmonary edema, on vent with hx of LVH and CM Severe diastolic CHF.  Better AM 4/25, extubated. Plan to get cards consult. Resume beta blockers.  CC  Signed:  Dow Adolph, MD PGY-1 Internal Medicine Teaching Service Pager: [phone] 09/01/2012, 7:18 AM    Dorcas Carrow Beeper  [phone]  Cell  [phone]  If no response or cell goes to voicemail, call beeper [phone]

## 2012-09-01 NOTE — Progress Notes (Signed)
ANTICOAGULATION CONSULT NOTE - Follow Up Consult  Pharmacy Consult for Coumadin Indication: atrial fibrillation  No Known Allergies  Patient Measurements: Height: 5\' 3"  (160 cm) Weight: 178 lb 2.1 oz (80.8 kg) IBW/kg (Calculated) : 52.4  Vital Signs: Temp: 98.8 F (37.1 C) (04/25 0800) Temp src: Oral (04/25 0800) BP: 115/67 mmHg (04/25 1000) Pulse Rate: 83 (04/25 1000)  Labs:  Recent Labs  08/31/12 1536  08/31/12 1908 08/31/12 1931 08/31/12 2120 09/01/12 0245 09/01/12 0500 09/01/12 0845  HGB  --   < > 9.4* 10.9*  --   --  8.7*  --   HCT  --   --  28.8* 32.0*  --   --  26.7*  --   PLT  --   --  271  --   --   --  240  --   LABPROT  --   --   --   --   --   --  22.6*  --   INR 2.7  --   --   --   --   --  2.09*  --   CREATININE  --   < > 1.98* 1.80*  --   --  1.95*  --   TROPONINI  --   --   --   --  <0.30 0.31*  --  <0.30  < > = values in this interval not displayed.  Estimated Creatinine Clearance: 26.7 ml/min (by C-G formula based on Cr of 1.95).   Medications:  Scheduled:  . amiodarone  200 mg Oral Daily  . antiseptic oral rinse  15 mL Mouth Rinse QID  . aspirin  300 mg Rectal Once  . chlorhexidine  15 mL Mouth Rinse BID  . [COMPLETED] chlorhexidine      . [COMPLETED] fentaNYL  50 mcg Intravenous Once  . furosemide  40 mg Intravenous BID  . heparin  5,000 Units Subcutaneous Q8H  . insulin aspart  0-9 Units Subcutaneous Q4H  . metoprolol tartrate  25 mg Per Tube BID  . [EXPIRED] midazolam      . [COMPLETED] midazolam  2 mg Intravenous Once  . pantoprazole (PROTONIX) IV  40 mg Intravenous QHS  . [COMPLETED] potassium chloride  10 mEq Intravenous Q1 Hr x 4  . sodium chloride  3 mL Intravenous Q12H  . Warfarin - Pharmacist Dosing Inpatient   Does not apply q1800  . [DISCONTINUED] aspirin  324 mg Oral NOW  . [DISCONTINUED] aspirin  300 mg Rectal NOW  . [DISCONTINUED] aspirin  325 mg Oral Once  . [DISCONTINUED] furosemide  80 mg Intravenous Once  .  [DISCONTINUED] furosemide  80 mg Intravenous Q6H  . [DISCONTINUED] methylPREDNISolone (SOLU-MEDROL) injection  80 mg Intravenous Q8H  . [DISCONTINUED] propofol  0.5 mg/kg Intravenous Once   PTA Coumadin regimen:  Coumadin 5mg  on Mondays, 2.5mg  all other days  Assessment: 72 year old female on chronic anticoagulation with Coumadin for atrial fibrillation, admitted 4/24 with respiratory failure.  Her INR was 2.7 on admission and it was unknown if she had taken her Coumadin dose 4/24 and thus no dose was ordered for 4/24.  Her INR is down to 2.1 today.  Note she has SQ Heparin ordered.  Goal of Therapy:  INR 2-3   Plan:  Give Coumadin 5mg  PO x 1 today Daily PT/INR monitoring  D/C SQ Heparin  Estella Husk, Pharm.D., BCPS Clinical Pharmacist Phone: [phone] or [phone] Pager: [phone] 09/01/2012, 10:31 AM

## 2012-09-02 DIAGNOSIS — E119 Type 2 diabetes mellitus without complications: Secondary | ICD-10-CM

## 2012-09-02 LAB — CBC
MCHC: 33.5 g/dL (ref 30.0–36.0)
MCV: 77.2 fL — ABNORMAL LOW (ref 78.0–100.0)
Platelets: 247 10*3/uL (ref 150–400)
RDW: 18.9 % — ABNORMAL HIGH (ref 11.5–15.5)
WBC: 16.2 10*3/uL — ABNORMAL HIGH (ref 4.0–10.5)

## 2012-09-02 LAB — URINE CULTURE: Special Requests: NORMAL

## 2012-09-02 LAB — BASIC METABOLIC PANEL
BUN: 23 mg/dL (ref 6–23)
Calcium: 11.5 mg/dL — ABNORMAL HIGH (ref 8.4–10.5)
Chloride: 106 mEq/L (ref 96–112)
Creatinine, Ser: 2.08 mg/dL — ABNORMAL HIGH (ref 0.50–1.10)
GFR calc Af Amer: 26 mL/min — ABNORMAL LOW (ref >90)
GFR calc non Af Amer: 23 mL/min — ABNORMAL LOW (ref >90)

## 2012-09-02 LAB — GLUCOSE, CAPILLARY: Glucose-Capillary: 128 mg/dL — ABNORMAL HIGH (ref 70–99)

## 2012-09-02 MED ORDER — ONDANSETRON HCL 4 MG/2ML IJ SOLN
4.0000 mg | Freq: Four times a day (QID) | INTRAMUSCULAR | Status: DC | PRN
  Administered 2012-09-02 – 2012-09-03 (×2): 4 mg via INTRAVENOUS
  Filled 2012-09-02 (×2): qty 2

## 2012-09-02 MED ORDER — WARFARIN SODIUM 2.5 MG PO TABS
2.5000 mg | ORAL_TABLET | Freq: Once | ORAL | Status: AC
  Administered 2012-09-02: 2.5 mg via ORAL
  Filled 2012-09-02: qty 1

## 2012-09-02 MED ORDER — PANTOPRAZOLE SODIUM 40 MG PO TBEC
40.0000 mg | DELAYED_RELEASE_TABLET | Freq: Two times a day (BID) | ORAL | Status: DC
  Administered 2012-09-02 (×2): 40 mg via ORAL

## 2012-09-02 MED ORDER — PANTOPRAZOLE SODIUM 40 MG PO TBEC
40.0000 mg | DELAYED_RELEASE_TABLET | Freq: Two times a day (BID) | ORAL | Status: DC
  Filled 2012-09-02: qty 1

## 2012-09-02 MED ORDER — ALUM & MAG HYDROXIDE-SIMETH 200-200-20 MG/5ML PO SUSP
30.0000 mL | Freq: Four times a day (QID) | ORAL | Status: DC | PRN
  Administered 2012-09-02: 30 mL via ORAL
  Filled 2012-09-02: qty 30

## 2012-09-02 NOTE — Evaluation (Signed)
Occupational Therapy Evaluation Patient Details Name: [name] MRN: [id number] DOB: 1940/06/08 Today's Date: 09/02/2012 Time: 2725-3664 OT Time Calculation (min): 25 min  OT Assessment / Plan / Recommendation Clinical Impression  Pt admitted with SOB, pulmonary edema, acute respiratory failure requiring intubation.  Pt presents with decreased activity tolerance and DOE.  Will benefit from acute OT to address ADL and instruct in energy conservation for potential d/c home alone.  Will also evaluate for shower equipment.    OT Assessment  Patient needs continued OT Services    Follow Up Recommendations  No OT follow up;Supervision - Intermittent    Barriers to Discharge      Equipment Recommendations   (will assess for shower seat)    Recommendations for Other Services    Frequency  Min 2X/week    Precautions / Restrictions Precautions Precautions: None Precaution Comments: watch sats Restrictions Weight Bearing Restrictions: No   Pertinent Vitals/Pain No pain, 02 sats dropped to 82 % on RA, returned to 93% with 2 L 02 and seated rest break    ADL  Eating/Feeding: Independent Where Assessed - Eating/Feeding: Edge of bed Grooming: Wash/dry hands;Supervision/safety Where Assessed - Grooming: Unsupported sitting (seated on rollator) Upper Body Bathing: Set up Where Assessed - Upper Body Bathing: Unsupported sitting Lower Body Bathing: Supervision/safety Where Assessed - Lower Body Bathing: Unsupported sitting;Supported sit to stand Upper Body Dressing: Set up Where Assessed - Upper Body Dressing: Unsupported standing Lower Body Dressing: Supervision/safety Where Assessed - Lower Body Dressing: Unsupported sitting;Supported sit to stand Toilet Transfer: Supervision/safety Toilet Transfer Method: Sit to Barista: Regular height toilet Toileting - Clothing Manipulation and Hygiene: Independent Where Assessed - Engineer, mining and  Hygiene: Sit to stand from 3-in-1 or toilet Equipment Used:  (rollator, 02) Transfers/Ambulation Related to ADLs: supervision with RW, monitored 02 sats throughout ADL Comments: Pt primarily limited by SOB and decreased activity tolerance.    OT Diagnosis: Generalized weakness  OT Problem List: Decreased activity tolerance;Impaired balance (sitting and/or standing);Decreased knowledge of use of DME or AE;Cardiopulmonary status limiting activity;Obesity OT Treatment Interventions: Self-care/ADL training;DME and/or AE instruction;Energy conservation;Patient/family education   OT Goals Acute Rehab OT Goals OT Goal Formulation: With patient Time For Goal Achievement: 09/09/12 Potential to Achieve Goals: Good ADL Goals Pt Will Perform Grooming: with modified independence;Standing at sink (3 activities) ADL Goal: Grooming - Progress: Goal set today Pt Will Perform Lower Body Bathing: with modified independence;Sitting at sink;Sit to stand from chair ADL Goal: Lower Body Bathing - Progress: Goal set today Pt Will Perform Lower Body Dressing: with modified independence;Sit to stand from bed ADL Goal: Lower Body Dressing - Progress: Goal set today Pt Will Transfer to Toilet: with modified independence;Ambulation;with DME ADL Goal: Toilet Transfer - Progress: Goal set today Pt Will Perform Tub/Shower Transfer: Shower transfer;Ambulation;with modified independence (determine need for shower seat) ADL Goal: Tub/Shower Transfer - Progress: Goal set today Miscellaneous OT Goals Miscellaneous OT Goal #1: Pt will state at least 3 energy conservation techniques as instructed. OT Goal: Miscellaneous Goal #1 - Progress: Goal set today  Visit Information  Last OT Received On: 09/02/12 Assistance Needed: +1 PT/OT Co-Evaluation/Treatment: Yes    Subjective Data  Subjective: "I was so comfortable in my bed." Patient Stated Goal: Home alone.   Prior Functioning     Home Living Lives With:  Alone Type of Home: Apartment Home Access: Elevator Home Layout: One level Bathroom Shower/Tub: Health visitor: Handicapped height Home Adaptive Equipment: Grab bars around  toilet;Grab bars in shower;Walker - four wheeled Prior Function Level of Independence: Independent with assistive device(s) Vocation: Retired Musician: No difficulties Dominant Hand: Right         Vision/Perception     Cognition  Cognition Arousal/Alertness: Awake/alert Behavior During Therapy: WFL for tasks assessed/performed Overall Cognitive Status: Within Functional Limits for tasks assessed    Extremity/Trunk Assessment Right Upper Extremity Assessment RUE ROM/Strength/Tone: WFL for tasks assessed Left Upper Extremity Assessment LUE ROM/Strength/Tone: WFL for tasks assessed Right Lower Extremity Assessment RLE ROM/Strength/Tone: [facility] for tasks assessed Left Lower Extremity Assessment LLE ROM/Strength/Tone: WFL for tasks assessed     Mobility Bed Mobility Bed Mobility: Rolling Right;Right Sidelying to Sit;Sit to Sidelying Right Rolling Right: 6: Modified independent (Device/Increase time) Right Sidelying to Sit: 6: Modified independent (Device/Increase time);HOB flat Sit to Sidelying Right: 6: Modified independent (Device/Increase time);HOB flat Details for Bed Mobility Assistance: Incr time Transfers Transfers: Sit to Stand;Stand to Sit Sit to Stand: 5: Supervision;With upper extremity assist;From bed;From toilet Stand to Sit: 5: Supervision;With upper extremity assist;To bed;To toilet     Exercise     Balance Balance Balance Assessed: Yes Static Standing Balance Static Standing - Balance Support: No upper extremity supported Static Standing - Level of Assistance: 5: Stand by assistance   End of Session OT - End of Session Activity Tolerance: Patient limited by fatigue Patient left: in bed;with call bell/phone within reach  GO     Evern Bio 09/02/2012, 3:54 PM [phone]

## 2012-09-02 NOTE — Progress Notes (Signed)
Patient ID: [name], female   DOB: 1940-11-02, 72 y.o.   MRN: [id number] Subjective:  "I feel better"  Objective:  Vital Signs in the last 24 hours: Temp:  [97.9 F (36.6 C)-98.5 F (36.9 C)] 98.2 F (36.8 C) (04/26 0734) Pulse Rate:  [56-76] 65 (04/26 1000) Resp:  [19-32] 28 (04/26 1000) BP: (95-136)/(46-77) 133/66 mmHg (04/26 1000) SpO2:  [88 %-100 %] 98 % (04/26 1000) Arterial Line BP: (112)/(54) 112/54 mmHg (04/25 1100)  Intake/Output from previous day: 04/25 0701 - 04/26 0700 In: 750 [P.O.:270; I.V.:480] Out: 2275 [Urine:2275] Intake/Output from this shift: Total I/O In: 240 [P.O.:220; I.V.:20] Out: 350 [Urine:350]  Physical Exam: Well appearing NAD HEENT: Unremarkable Neck:  7 cm JVD, no thyromegally Back:  No CVA tenderness Lungs:  Clear except for basilar rales HEART:  Regular rate rhythm, grade 2/6 systolic murmur, no rubs, no clicks Abd:  soft, positive bowel sounds, no organomegally, no rebound, no guarding Ext:  2 plus pulses, no edema, no cyanosis, no clubbing Skin:  No rashes no nodules Neuro:  CN II through XII intact, motor grossly intact  Lab Results:  Recent Labs  09/01/12 0500 09/02/12 0530  WBC 11.1* 16.2*  HGB 8.7* 8.4*  PLT 240 247    Recent Labs  09/01/12 1635 09/02/12 0530  NA 140 139  K 3.3* 3.4*  CL 105 106  CO2 24 25  GLUCOSE 211* 156*  BUN 22 23  CREATININE 2.18* 2.08*    Recent Labs  09/01/12 0245 09/01/12 0845  TROPONINI 0.31* <0.30   Hepatic Function Panel  Recent Labs  08/31/12 2126  09/01/12 1635  PROT 6.6  --  7.1  ALBUMIN 2.8*  < > 3.1*  AST 20  --  25  ALT 10  --  14  ALKPHOS 139*  --  138*  BILITOT 0.3  --  0.5  BILIDIR 0.1  --   --   IBILI 0.2*  --   --   < > = values in this interval not displayed. No results found for this basename: CHOL,  in the last 72 hours No results found for this basename: PROTIME,  in the last 72 hours  Imaging: Dg Chest Port 1 View  09/01/2012  *RADIOLOGY  REPORT*  Clinical Data: Respiratory failure  PORTABLE CHEST - 1 VIEW  Comparison: Yesterday  Findings: Endotracheal tube, NG tube, right internal jugular central venous catheter are stable.  Diffuse airspace disease has cleared with some residual disease left greater than right.  Lungs remain under aerated.  Cardiomegaly.  No pneumothorax.  IMPRESSION: Improving bilateral airspace disease.   Original Report Authenticated By: Jolaine Click, M.D.    Dg Chest Portable 1 View  08/31/2012  *RADIOLOGY REPORT*  Clinical Data: PICC line placement.  Coronary artery disease. Shortness of breath.  PORTABLE CHEST - 1 VIEW  Comparison: 08/31/2012  Findings: Endotracheal tube terminates at the level of clavicles, 4.7 cm above carina.  Nasogastric extends beyond the  inferior aspect of the film.  Right internal jugular line terminates at the low SVC versus caval atrial junction.  No pneumothorax.  Cardiomegaly accentuated by AP portable technique.  No pleural fluid.  Minimal worsening of moderate interstitial prominence.  IMPRESSION: Right internal jugular line at low SVC versus caval atrial junction.  Retraction of 2 cm would place as well into the low SVC.  Slight worsening in aeration, presumably secondary increased congestive heart failure.  No pneumothorax.   Original Report Authenticated By: Jeronimo Greaves, M.D.  Dg Chest Portable 1 View  08/31/2012  *RADIOLOGY REPORT*  Clinical Data: Respiratory failure and status post intubation.  PORTABLE CHEST - 1 VIEW  Comparison: 06/21/2012  Findings: The patient has been intubated and the endotracheal tube tip is approximately 4 cm above the carina.  A nasogastric tube has also been placed which extends below the diaphragm.  Lungs show diffuse edema, likely reflecting congestive heart failure.  There is stable cardiomegaly.  No large pleural effusions are identified.  IMPRESSION: Satisfactory positioning of endotracheal tube.  Lungs show diffuse CHF and stable cardiomegaly.    Original Report Authenticated By: Irish Lack, M.D.     Cardiac Studies: Tele- NSR Assessment/Plan:  1. Acute respiratory failure 2. Acute diastolic CHF 3. HCM with 100 mmHg midcavitary gradient 4. Abdominal discomfort - mild Rec: She has had a nice diuresis. Would hold off on adding lasix currently. Abdominal exam is benign. Continue amio, calcium channel blocker and follow renal function  LOS: 2 days    Sharlot Gowda Ahlijah Raia,M.D 09/02/2012, 10:56 AM

## 2012-09-02 NOTE — Progress Notes (Addendum)
PULMONARY  / CRITICAL CARE MEDICINE  Name: [name] MRN: [id number] DOB: Jul 09, 1940    ADMISSION DATE:  08/31/2012  REFERRING MD :  Dr. Preston Fleeting PRIMARY SERVICE: PCCM  CHIEF COMPLAINT:  Shortness of breath  BRIEF PATIENT DESCRIPTION:  [name] is a 72 year old woman with PMH of DM2, A. Fib on amio and warfarin, CKD, CAD, and chronic diastolic HF, presenting with acute pulmonary edema with acute respiratory failure requiring intubation. She had similar presentation on 06/16/12 also requiring intubation for acute respiratory distress.   SIGNIFICANT EVENTS / STUDIES:  4/24 Intubation, admission to ICU 4/24 CXR diffuse bilateral infiltrates  4/25 extubated   LINES / TUBES: ETT 4/24>>>4/25 R IJ 4/24>> Foley 4/24>>4/25 Art line 4/24>>4/25   CULTURES: Urine Cx 4/25>> Resp Asp cx 4/25>> MRSA Screen - Positive    ANTIBIOTICS: None    SUBJECTIVE:  Alert and cooperative. Obeys commands. Denies pain.  VITAL SIGNS: Temp:  [97.9 F (36.6 C)-98.5 F (36.9 C)] 98.2 F (36.8 C) (04/26 0734) Pulse Rate:  [56-83] 63 (04/26 0600) Resp:  [19-31] 28 (04/26 0600) BP: (95-145)/(46-77) 120/77 mmHg (04/26 0600) SpO2:  [88 %-100 %] 99 % (04/26 0600) Arterial Line BP: (112-162)/(54-74) 112/54 mmHg (04/25 1100) FiO2 (%):  [28 %] 28 % (04/25 0918) HEMODYNAMICS: CVP:  [5 mmHg-8 mmHg] 7 mmHg VENTILATOR SETTINGS: Vent Mode:  [-]  FiO2 (%):  [28 %] 28 % INTAKE / OUTPUT: Intake/Output     04/25 0701 - 04/26 0700 04/26 0701 - 04/27 0700   P.O. 270    I.V. (mL/kg) 460 (5.7)    IV Piggyback     Total Intake(mL/kg) 730 (9)    Urine (mL/kg/hr) 2275 (1.2)    Total Output 2275     Net -1545            PHYSICAL EXAMINATION: General:  Alert and interactive.  Neuro:  RASS 0 HEENT:  ETT in place with moderate oral secretions. Normal size pupils. Head normocephalic and atraumatic.  Cardiovascular:  s1, s2, RRR, JVD not raised. Lungs:  CTA, no wheezing, equal air entry bilaterally Abdomen:   Soft, non distended, +BS  Musculoskeletal:  1-2+ pitting edema bilaterally up to her knees Skin:  Intact, dry  LABS:  Recent Labs Lab 08/31/12 1536  08/31/12 1908 08/31/12 1931 08/31/12 1959 08/31/12 2120 08/31/12 2126 08/31/12 2127 08/31/12 2132 08/31/12 2330 09/01/12 0245 09/01/12 0500 09/01/12 0845 09/01/12 1635 09/02/12 0530  HGB  --   < > 9.4* 10.9*  --   --   --   --   --   --   --  8.7*  --   --  8.4*  WBC  --   --  9.3  --   --   --   --   --   --   --   --  11.1*  --   --  16.2*  PLT  --   --  271  --   --   --   --   --   --   --   --  240  --   --  247  NA  --   < > 136 141  --   --   --   --   --   --   --  140  --  140 139  K  --   < > 3.1* 3.2*  --   --   --   --   --   --   --  3.6  --  3.3* 3.4*  CL  --   < > 104 108  --   --   --   --   --   --   --  107  --  105 106  CO2  --   < > 18*  --   --   --   --   --   --   --   --  23  --  24 25  GLUCOSE  --   < > 308* 305*  --   --   --   --   --   --   --  168*  --  211* 156*  BUN  --   < > 14 15  --   --   --   --   --   --   --  17  --  22 23  CREATININE  --   < > 1.98* 1.80*  --   --   --   --   --   --   --  1.95*  --  2.18* 2.08*  CALCIUM  --   < > 10.8*  --   --   --   --   --   --   --   --  11.1*  --  11.4* 11.5*  MG  --   --   --   --   --   --  2.0  --   --   --   --   --   --   --   --   PHOS  --   --   --   --   --   --  2.6  --   --   --   --  2.3  --   --   --   AST  --   --  25  --   --   --  20  --   --   --   --   --   --  25  --   ALT  --   --  12  --   --   --  10  --   --   --   --   --   --  14  --   ALKPHOS  --   --  166*  --   --   --  139*  --   --   --   --   --   --  138*  --   BILITOT  --   --  0.3  --   --   --  0.3  --   --   --   --   --   --  0.5  --   PROT  --   --  7.5  --   --   --  6.6  --   --   --   --   --   --  7.1  --   ALBUMIN  --   < > 3.2*  --   --   --  2.8*  --   --   --   --  3.1*  --  3.1*  --   INR 2.7  --   --   --   --   --   --   --   --   --   --  2.09*  --   --  2.23*  LATICACIDVEN  --   --   --  4.40*  --   --   --   --  1.9  --   --   --   --   --   --   TROPONINI  --   --   --   --   --  <0.30  --   --   --   --  0.31*  --  <0.30  --   --   PROCALCITON  --   --   --   --   --   --   --  2.16  --   --   --  13.29  --   --   --   PROBNP  --   --  2232.0*  --   --   --   --   --   --   --   --   --   --   --   --   PHART  --   --   --   --  7.286*  --   --   --   --  7.349*  --   --   --   --   --   PCO2ART  --   --   --   --  39.0  --   --   --   --  39.8  --   --   --   --   --   PO2ART  --   --   --   --  86.0  --   --   --   --  101.0*  --   --   --   --   --   < > = values in this interval not displayed.  Recent Labs Lab 09/01/12 0353 09/01/12 1145 09/01/12 1555 09/01/12 1934 09/02/12 0706  GLUCAP 149* 157* 206* 132* 169*    CXR: 4/24 Bilateral patchy infiltrates, ETT in adequate position, R IJ in   ASSESSMENT / PLAN: Principal Problem:   Acute respiratory failure with hypoxia Active Problems:   HYPERTENSION   CHRONIC KIDNEY DISEASE STAGE V   CARDIOMYOPATHY, IDIOPATHIC HYPERTROPHIC   CAD   Acute on chronic diastolic congestive heart failure   Diabetes mellitus, type 2   COPD (chronic obstructive pulmonary disease)   PULMONARY A: Acute hypoxemic respiratory failure - likely 2/2 pulmonary edema resolved COPD?, heavy smoking history      P:   Titrate oxygen BDs   CARDIOVASCULAR A:  Baseline Hypertension. Labile BP, initially hypertensive, then hypotension related to propofol, Versed, and Fentanyl administration. 2D echo 2/14; EF 65-70%, grade 2DD, calcified mitral valve, pulmonary HTN with peak pressure of 49 mmHg. A. Fibrillation. Lactic acid 4.4 >>1.9. LQTc. Normal LFT, elevated alk phosphatase.  BB discontinued at outpatient due to hair loss. P:  Hold amiodarone D/c hydralazine cardiazem on schedule ASA  beta blockers stopped by cards 4/25    RENAL A:  CKD, baseline creatine with wide range of 1.8 to 2.4. Cr on  presentation at 1.8 >>2.08      Hypokalemia 3.2 >>3.4, normal magnesium      CKD P:   Trend BMET Replete K as needed.  GASTROINTESTINAL A:  No active issues P:   Monitor for GI bleed on warfarin protonix   HEMATOLOGIC A:  Chronic normocytic -normochromic Anemia. Baseline Hgb of 9, on admission Hgb 10.9>>8.7. No active bleeding. INR of 2.7 on 4/24 >>2.09.  P:  Coumadin  CBC trend    INFECTIOUS A:  No active issues. Procalcitonin level is normal. No leukocytosis. MRSA positive on screening. Elevated PCT 2.16>>13.29 ? Source of infection UTI Vs respiratory      P:   Monitor CBC Urine culture  Respiratory aspirate culture  Hold ABX as of yet  ENDOCRINE A:  Uncontrolled DM2 - Last Hgb A1C 8.7% on 04/2012       TSH 1.432, normal free T4       Primary hyperparathyroidism Ca elevated to 11.4,  PTH elevated to 727       Likely related to CKD        P:   CBG tid ac hs SSI   NEUROLOGIC A:  intact P:   Off sedation   tfr to floor today and ask TRH to assume care AM 4/27  TODAY'S SUMMARY: VDRF 2/2 pulmonary edema, on vent with hx of LVH and CM Severe diastolic CHF.  Better AM 4/25, extubated. Plan tfr to floor and adjust meds.  Avoid hydralazine/norvasc.  Started cardiazem    09/02/2012, 8:35 AM    Dorcas Carrow Beeper  [phone]  Cell  [phone]  If no response or cell goes to voicemail, call beeper [phone]

## 2012-09-02 NOTE — Progress Notes (Signed)
[facility] ADULT ICU REPLACEMENT PROTOCOL FOR AM LAB REPLACEMENT ONLY  The patient does not apply for the [facility] Adult ICU Electrolyte Replacment Protocol based on the criteria listed below:   1. Is GFR >/= 40 ml/min? no  Patient's GFR today is 26 2. Is urine output >/= 0.5 ml/kg/hr for the last 6 hours? yes Patient's UOP is 0.7 ml/kg/hr 3. Is BUN < 60 mg/dL? yes  Patient's BUN today is 60 4. Abnormal electrolyte(s):K+3.4 5. Ordered repletion with: none 6. If a panic level lab has been reported, has the CCM MD in charge been notified? yes.   Physician:  Dr Deveron Furlong 09/02/2012 6:31 AM

## 2012-09-02 NOTE — Evaluation (Signed)
Physical Therapy Evaluation Patient Details Name: [name] MRN: [id number] DOB: 09/07/1940 Today's Date: 09/02/2012 Time: 2952-8413 PT Time Calculation (min): 25 min  PT Assessment / Plan / Recommendation Clinical Impression  Pt adm with pulmonary edema and required intubation.  Needs skilled PT to maximize I and safety and incr activity tolerance so pt can return home.  Expect pt will make good progress.    PT Assessment  Patient needs continued PT services    Follow Up Recommendations  No PT follow up    Does the patient have the potential to tolerate intense rehabilitation      Barriers to Discharge        Equipment Recommendations  None recommended by PT    Recommendations for Other Services     Frequency Min 3X/week    Precautions / Restrictions Precautions Precautions: None Restrictions Weight Bearing Restrictions: No   Pertinent Vitals/Pain SaO2 to 82% on RA with amb.  Replaced O2 and SaO2 returned to 93%.      Mobility  Bed Mobility Bed Mobility: Rolling Right;Right Sidelying to Sit;Sit to Sidelying Right Rolling Right: 6: Modified independent (Device/Increase time) Right Sidelying to Sit: 6: Modified independent (Device/Increase time);HOB flat Sit to Sidelying Right: 6: Modified independent (Device/Increase time);HOB flat Details for Bed Mobility Assistance: Incr time Transfers Transfers: Sit to Stand;Stand to Sit Sit to Stand: 5: Supervision;With upper extremity assist;From bed;From toilet Stand to Sit: 5: Supervision;With upper extremity assist;To bed;To toilet Ambulation/Gait Ambulation/Gait Assistance: 5: Supervision Ambulation Distance (Feet): 30 Feet Assistive device: Rollator Ambulation/Gait Assistance Details: Pt amb into bathroom without O2.  Pt with incr SOB and decr SaO2 - replaced O2. Gait Pattern: Step-through pattern;Decreased stride length Gait velocity: decr    Exercises     PT Diagnosis: Difficulty walking  PT Problem List:  Decreased activity tolerance;Decreased mobility PT Treatment Interventions: Gait training;Patient/family education;Functional mobility training;Therapeutic activities;Therapeutic exercise   PT Goals Acute Rehab PT Goals PT Goal Formulation: With patient Time For Goal Achievement: 09/09/12 Potential to Achieve Goals: Good Pt will go Sit to Stand: with modified independence PT Goal: Sit to Stand - Progress: Goal set today Pt will go Stand to Sit: with modified independence PT Goal: Stand to Sit - Progress: Goal set today Pt will Ambulate: 51 - 150 feet;with modified independence;with least restrictive assistive device PT Goal: Ambulate - Progress: Goal set today  Visit Information  Last PT Received On: 09/02/12 Assistance Needed: +1 PT/OT Co-Evaluation/Treatment: Yes    Subjective Data  Subjective: Pt states she is sleepy. Patient Stated Goal: Return home   Prior Functioning  Home Living Lives With: Alone Type of Home: Apartment Home Access: Elevator Home Layout: One level Bathroom Shower/Tub: Health visitor: Handicapped height Home Adaptive Equipment: Grab bars around toilet;Grab bars in [employer] - four wheeled Prior Function Level of Independence: Independent with assistive device(s) Vocation: Retired Musician: No difficulties Dominant Hand: Right    Cognition  Cognition Arousal/Alertness: Awake/alert Behavior During Therapy: WFL for tasks assessed/performed Overall Cognitive Status: Within Functional Limits for tasks assessed    Extremity/Trunk Assessment Right Lower Extremity Assessment RLE ROM/Strength/Tone: WFL for tasks assessed Left Lower Extremity Assessment LLE ROM/Strength/Tone: WFL for tasks assessed   Balance Balance Balance Assessed: Yes Static Standing Balance Static Standing - Balance Support: No upper extremity supported Static Standing - Level of Assistance: 5: Stand by assistance  End of Session PT - End  of Session Activity Tolerance: Patient limited by fatigue Patient left: in bed;with call bell/phone within reach  Nurse Communication: Mobility status  GP     Kyri Dai 09/02/2012, 3:26 PM  [facility] PT [phone]

## 2012-09-02 NOTE — Progress Notes (Signed)
Patient received to 4729.  Alert and oriented.  Voiding without difficulty.  IV site unremarkable.  Bruising noted on both arm, so skin tears or other issues.  Transporting nurse reported that she has had coverage for blood sugar prior to arrival of 1 unit.  O2 via nasal cannula as ordered.  Oriented to the unit.

## 2012-09-02 NOTE — Progress Notes (Signed)
Central catheter removed. Catheter intact. Petrol bandage placed during removal. Patient tolerated well. No signs of active bleeding, shortness of breath or tachycardia.

## 2012-09-02 NOTE — Progress Notes (Signed)
ANTICOAGULATION CONSULT NOTE - Follow Up Consult  Pharmacy Consult for Coumadin Indication: atrial fibrillation  No Known Allergies  Patient Measurements: Height: 5\' 3"  (160 cm) Weight: 178 lb 2.1 oz (80.8 kg) IBW/kg (Calculated) : 52.4  Vital Signs: Temp: 98.2 F (36.8 C) (04/26 0734) Temp src: Oral (04/26 0734) BP: 133/66 mmHg (04/26 1000) Pulse Rate: 65 (04/26 1000)  Labs:  Recent Labs  08/31/12 1536  08/31/12 1908 08/31/12 1931 08/31/12 2120 09/01/12 0245 09/01/12 0500 09/01/12 0845 09/01/12 1635 09/02/12 0530  HGB  --   < > 9.4* 10.9*  --   --  8.7*  --   --  8.4*  HCT  --   < > 28.8* 32.0*  --   --  26.7*  --   --  25.1*  PLT  --   --  271  --   --   --  240  --   --  247  LABPROT  --   --   --   --   --   --  22.6*  --   --  23.7*  INR 2.7  --   --   --   --   --  2.09*  --   --  2.23*  CREATININE  --   < > 1.98* 1.80*  --   --  1.95*  --  2.18* 2.08*  TROPONINI  --   --   --   --  <0.30 0.31*  --  <0.30  --   --   < > = values in this interval not displayed.  Estimated Creatinine Clearance: 25 ml/min (by C-G formula based on Cr of 2.08).   Medications:  Scheduled:  . amiodarone  200 mg Oral Daily  . diltiazem  30 mg Oral Q8H  . insulin aspart  0-9 Units Subcutaneous TID AC & HS  . pantoprazole  40 mg Oral BID AC  . sodium chloride  3 mL Intravenous Q12H  . [COMPLETED] warfarin  5 mg Oral ONCE-1800  . Warfarin - Pharmacist Dosing Inpatient   Does not apply q1800  . [DISCONTINUED] antiseptic oral rinse  15 mL Mouth Rinse QID  . [DISCONTINUED] aspirin  300 mg Rectal Once  . [DISCONTINUED] chlorhexidine  15 mL Mouth Rinse BID  . [DISCONTINUED] furosemide  40 mg Intravenous BID  . [DISCONTINUED] insulin aspart  0-9 Units Subcutaneous Q4H  . [DISCONTINUED] metoprolol tartrate  25 mg Oral BID  . [DISCONTINUED] pantoprazole  40 mg Oral BID AC  . [DISCONTINUED] pantoprazole (PROTONIX) IV  40 mg Intravenous QHS   PTA Coumadin regimen:  Coumadin 5mg  on  Mondays, 2.5mg  all other days  Assessment: 72 year old female on chronic anticoagulation with Coumadin for atrial fibrillation, admitted 4/24 with respiratory failure.  Her INR remains therapeutic today.  Goal of Therapy:  INR 2-3   Plan:  Give Coumadin 2.5mg  PO x 1 today Daily PT/INR monitoring   Estella Husk, Pharm.D., BCPS Clinical Pharmacist Phone: [phone] or [phone] Pager: [phone] 09/02/2012, 10:56 AM

## 2012-09-03 LAB — BASIC METABOLIC PANEL
BUN: 19 mg/dL (ref 6–23)
CO2: 25 mEq/L (ref 19–32)
Chloride: 106 mEq/L (ref 96–112)
Glucose, Bld: 160 mg/dL — ABNORMAL HIGH (ref 70–99)
Potassium: 3.6 mEq/L (ref 3.5–5.1)
Sodium: 138 mEq/L (ref 135–145)

## 2012-09-03 LAB — CBC
HCT: 25.3 % — ABNORMAL LOW (ref 36.0–46.0)
Hemoglobin: 8.1 g/dL — ABNORMAL LOW (ref 12.0–15.0)
MCH: 25.5 pg — ABNORMAL LOW (ref 26.0–34.0)
MCHC: 32 g/dL (ref 30.0–36.0)
RBC: 3.18 MIL/uL — ABNORMAL LOW (ref 3.87–5.11)

## 2012-09-03 LAB — CULTURE, RESPIRATORY W GRAM STAIN

## 2012-09-03 LAB — GLUCOSE, CAPILLARY
Glucose-Capillary: 144 mg/dL — ABNORMAL HIGH (ref 70–99)
Glucose-Capillary: 120 mg/dL — ABNORMAL HIGH (ref 70–99)
Glucose-Capillary: 154 mg/dL — ABNORMAL HIGH (ref 70–99)

## 2012-09-03 LAB — HEMOGLOBIN A1C: Mean Plasma Glucose: 183 mg/dL — ABNORMAL HIGH (ref <117)

## 2012-09-03 MED ORDER — WARFARIN SODIUM 2.5 MG PO TABS
2.5000 mg | ORAL_TABLET | Freq: Once | ORAL | Status: AC
  Administered 2012-09-03: 2.5 mg via ORAL
  Filled 2012-09-03: qty 1

## 2012-09-03 MED ORDER — FUROSEMIDE 40 MG PO TABS
40.0000 mg | ORAL_TABLET | Freq: Every day | ORAL | Status: DC
  Administered 2012-09-03 – 2012-09-04 (×2): 40 mg via ORAL
  Filled 2012-09-03 (×2): qty 1

## 2012-09-03 MED ORDER — ACETAMINOPHEN 500 MG PO TABS
500.0000 mg | ORAL_TABLET | Freq: Two times a day (BID) | ORAL | Status: DC | PRN
  Filled 2012-09-03: qty 1

## 2012-09-03 MED ORDER — ATORVASTATIN CALCIUM 10 MG PO TABS
10.0000 mg | ORAL_TABLET | Freq: Every day | ORAL | Status: DC
  Administered 2012-09-03: 10 mg via ORAL
  Filled 2012-09-03 (×2): qty 1

## 2012-09-03 MED ORDER — PANTOPRAZOLE SODIUM 40 MG PO TBEC
40.0000 mg | DELAYED_RELEASE_TABLET | Freq: Every day | ORAL | Status: DC
  Administered 2012-09-03 – 2012-09-04 (×2): 40 mg via ORAL
  Filled 2012-09-03: qty 1

## 2012-09-03 MED ORDER — AMIODARONE HCL 200 MG PO TABS
200.0000 mg | ORAL_TABLET | Freq: Every day | ORAL | Status: DC
  Administered 2012-09-03 – 2012-09-04 (×2): 200 mg via ORAL
  Filled 2012-09-03 (×2): qty 1

## 2012-09-03 NOTE — Progress Notes (Signed)
ANTICOAGULATION CONSULT NOTE - Follow Up Consult  Pharmacy Consult for Coumadin Indication: atrial fibrillation  No Known Allergies  Patient Measurements: Height: 5\' 3"  (160 cm) Weight: 170 lb 14.4 oz (77.52 kg) (scale c) IBW/kg (Calculated) : 52.4  Vital Signs: Temp: 99.8 F (37.7 C) (04/27 0448) Temp src: Oral (04/27 0448) BP: 126/88 mmHg (04/27 0448) Pulse Rate: 70 (04/27 0448)  Labs:  Recent Labs  08/31/12 2120 09/01/12 0245 09/01/12 0500 09/01/12 0845 09/01/12 1635 09/02/12 0530 09/03/12 0550  HGB  --   --  8.7*  --   --  8.4* 8.1*  HCT  --   --  26.7*  --   --  25.1* 25.3*  PLT  --   --  240  --   --  247 251  LABPROT  --   --  22.6*  --   --  23.7* 27.6*  INR  --   --  2.09*  --   --  2.23* 2.73*  CREATININE  --   --  1.95*  --  2.18* 2.08* 1.87*  TROPONINI <0.30 0.31*  --  <0.30  --   --   --     Estimated Creatinine Clearance: 27.2 ml/min (by C-G formula based on Cr of 1.87).   Assessment: 73 year old female on chronic anticoagulation with Coumadin (PTA dose 5mg  on Mondays, 2.5mg  all other days) for atrial fibrillation, admitted 4/24 with respiratory failure.  Her INR remains therapeutic today at 2.73. H/H low but stable, platelets WNL. No bleeding noted.  Goal of Therapy:  INR 2-3 Monitor platelets per anticoagulation protocol: yes   Plan:  1. Coumadin 2.5mg  PO x 1 today 2. Daily PT/INR 3. Monitor for signs/symptoms of bleeding  Ami Thornsberry D. Yena Tisby, PharmD Clinical Pharmacist Pager: [phone] 09/03/2012 11:36 AM

## 2012-09-03 NOTE — Progress Notes (Signed)
[facility] PROGRESS NOTE  [name] ZOX:[id number] DOB: 1940/09/11 DOA: 08/31/2012 PCP: She has not gotten one yet  Assessment/Plan: 1. Acute respiratory failure secondary to pulmonary edema-status post intubation mechanical ventilation. Extubated April 25 without problems. Maintaining good oxygen saturations 2. 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 Chronic atrial fibrillation on amiodarone and Coumadin 4. Chronic disease stage IV-stable 5. Diabetes mellitus type 2 - hemoglobin A1c pending  Code Status: Full code Family Communication: Patient (indicate person spoken with, relationship, and if by phone, the number) Disposition Plan: Home   Consultants:  Critical care  [place] [facility]  Procedures:  Intubation and mechanical ventilation   HPI/Subjective: Feels all right  Objective: Filed Vitals:   09/02/12 2300 09/02/12 2321 09/03/12 0054 09/03/12 0448  BP:   110/69 126/88  Pulse:   74 70  Temp: 100.1 F (37.8 C) 100.8 F (38.2 C) 99.1 F (37.3 C) 99.8 F (37.7 C)  TempSrc:   Oral Oral  Resp:   20 20  Height:      Weight:    77.52 kg (170 lb 14.4 oz)  SpO2:   97% 98%    Intake/Output Summary (Last 24 hours) at 09/03/12 0742 Last data filed at 09/03/12 8119  Gross per 24 hour  Intake    940 ml  Output   1350 ml  Net   -410 ml   Filed Weights   08/31/12 1855 09/02/12 1206 09/03/12 0448  Weight: 80.8 kg (178 lb 2.1 oz) 76.431 kg (168 lb 8 oz) 77.52 kg (170 lb 14.4 oz)    Exam:   General:  Alert and oriented x3  Cardiovascular: Irregularly irregular, systolic murmur  Respiratory: Minimal crackles  Abdomen: Soft nontender  Musculoskeletal: Intact   Data Reviewed: Basic Metabolic Panel:  Recent Labs Lab 08/31/12 1908 08/31/12 1931 08/31/12 2126 09/01/12 0500 09/01/12 1635 09/02/12 0530 09/03/12 0550  NA 136 141  --  140 140 139 138  K 3.1* 3.2*  --  3.6 3.3* 3.4* 3.6  CL 104 108  --  107  105 106 106  CO2 18*  --   --  23 24 25 25   GLUCOSE 308* 305*  --  168* 211* 156* 160*  BUN 14 15  --  17 22 23 19   CREATININE 1.98* 1.80*  --  1.95* 2.18* 2.08* 1.87*  CALCIUM 10.8*  --   --  11.1* 11.4* 11.5* 11.2*  MG  --   --  2.0  --   --   --   --   PHOS  --   --  2.6 2.3  --   --   --    Liver Function Tests:  Recent Labs Lab 08/31/12 1908 08/31/12 2126 09/01/12 0500 09/01/12 1635  AST 25 20  --  25  ALT 12 10  --  14  ALKPHOS 166* 139*  --  138*  BILITOT 0.3 0.3  --  0.5  PROT 7.5 6.6  --  7.1  ALBUMIN 3.2* 2.8* 3.1* 3.1*   No results found for this basename: LIPASE, AMYLASE,  in the last 168 hours No results found for this basename: AMMONIA,  in the last 168 hours CBC:  Recent Labs Lab 08/31/12 1908 08/31/12 1931 09/01/12 0500 09/02/12 0530 09/03/12 0550  WBC 9.3  --  11.1* 16.2* 16.5*  NEUTROABS 5.3  --   --   --   --   HGB 9.4* 10.9* 8.7* 8.4* 8.1*  HCT 28.8* 32.0* 26.7* 25.1*  25.3*  MCV 79.3  --  78.1 77.2* 79.6  PLT 271  --  240 247 251   Cardiac Enzymes:  Recent Labs Lab 08/31/12 2120 09/01/12 0245 09/01/12 0845  TROPONINI <0.30 0.31* <0.30   BNP (last 3 results)  Recent Labs  04/15/12 2000 06/02/12 0148 08/31/12 1908  PROBNP 1866.0* 1173.0* 2232.0*   CBG:  Recent Labs Lab 09/02/12 0706 09/02/12 1131 09/02/12 1642 09/02/12 2126 09/03/12 0614  GLUCAP 169* 129* 138* 128* 144*    Recent Results (from the past 240 hour(s))  MRSA PCR SCREENING     Status: Abnormal   Collection Time    08/31/12 11:32 PM      Result Value Range Status   MRSA by PCR POSITIVE (*) NEGATIVE Final   Comment:            The GeneXpert MRSA Assay (FDA     approved for NASAL specimens     only), is one component of a     comprehensive MRSA colonization     surveillance program. It is not     intended to diagnose MRSA     infection nor to guide or     monitor treatment for     MRSA infections.     RESULT CALLED TO, READ BACK BY AND VERIFIED WITH:      ARTIS,J RN 09/01/2012 0530 [place]  URINE CULTURE     Status: None   Collection Time    09/01/12  9:30 AM      Result Value Range Status   Specimen Description URINE, RANDOM   Final   Special Requests Normal   Final   Culture  Setup Time 09/01/2012 11:50   Final   Colony Count NO GROWTH   Final   Culture NO GROWTH   Final   Report Status 09/02/2012 FINAL   Final  CULTURE, RESPIRATORY (NON-EXPECTORATED)     Status: None   Collection Time    09/01/12 10:27 AM      Result Value Range Status   Specimen Description TRACHEAL ASPIRATE   Final   Special Requests Normal   Final   Gram Stain     Final   Value: RARE WBC PRESENT, PREDOMINANTLY PMN     RARE SQUAMOUS EPITHELIAL CELLS PRESENT     NO ORGANISMS SEEN   Culture Non-Pathogenic Oropharyngeal-type Flora Isolated.   Final   Report Status PENDING   Incomplete     Studies: No results found.  Scheduled Meds: . amiodarone  200 mg Oral Daily  . atorvastatin  10 mg Oral q1800  . diltiazem  30 mg Oral Q8H  . insulin aspart  0-9 Units Subcutaneous TID AC & HS  . pantoprazole  40 mg Oral Q1200  . sodium chloride  3 mL Intravenous Q12H  . Warfarin - Pharmacist Dosing Inpatient   Does not apply q1800   Continuous Infusions:   Principal Problem:   Acute respiratory failure with hypoxia Active Problems:   HYPERTENSION   CHRONIC KIDNEY DISEASE STAGE V   CARDIOMYOPATHY, IDIOPATHIC HYPERTROPHIC   CAD   Acute on chronic diastolic congestive heart failure   Diabetes mellitus, type 2   COPD (chronic obstructive pulmonary disease)        [name]  [facility] Pager [phone]. If 7PM-7AM, please contact night-coverage at [email or web address], password [facility] 09/03/2012, 7:42 AM  LOS: 3 days

## 2012-09-03 NOTE — Progress Notes (Signed)
Patient ID: REJOICE HEATWOLE, female   DOB: 1941/01/23, 72 y.o.   MRN: [id number] Subjective:  Denies chest pain or sob, minimal abdominal discomfort  Objective:  Vital Signs in the last 24 hours: Temp:  [98.1 F (36.7 C)-101.4 F (38.6 C)] 99.8 F (37.7 C) (04/27 0448) Pulse Rate:  [67-83] 70 (04/27 0448) Resp:  [20] 20 (04/27 0448) BP: (110-138)/(69-88) 126/88 mmHg (04/27 0448) SpO2:  [82 %-98 %] 98 % (04/27 0448) Weight:  [168 lb 8 oz (76.431 kg)-170 lb 14.4 oz (77.52 kg)] 170 lb 14.4 oz (77.52 kg) (04/27 0448)  Intake/Output from previous day: 04/26 0701 - 04/27 0700 In: 940 [P.O.:920; I.V.:20] Out: 1350 [Urine:1350] Intake/Output from this shift:    Physical Exam: Well appearing NAD HEENT: Unremarkable Neck:  7 cm JVD, no thyromegally Back:  No CVA tenderness Lungs:  Clear except for scattered basilar rales HEART:  Regular rate rhythm, 2/6 systolic murmur, no rubs, no clicks Abd:  Flat, positive bowel sounds, no organomegally, no rebound, no guarding Ext:  2 plus pulses, no edema, no cyanosis, no clubbing Skin:  No rashes no nodules Neuro:  CN II through XII intact, motor grossly intact  Lab Results:  Recent Labs  09/02/12 0530 09/03/12 0550  WBC 16.2* 16.5*  HGB 8.4* 8.1*  PLT 247 251    Recent Labs  09/02/12 0530 09/03/12 0550  NA 139 138  K 3.4* 3.6  CL 106 106  CO2 25 25  GLUCOSE 156* 160*  BUN 23 19  CREATININE 2.08* 1.87*    Recent Labs  09/01/12 0245 09/01/12 0845  TROPONINI 0.31* <0.30   Hepatic Function Panel  Recent Labs  08/31/12 2126  09/01/12 1635  PROT 6.6  --  7.1  ALBUMIN 2.8*  < > 3.1*  AST 20  --  25  ALT 10  --  14  ALKPHOS 139*  --  138*  BILITOT 0.3  --  0.5  BILIDIR 0.1  --   --   IBILI 0.2*  --   --   < > = values in this interval not displayed. No results found for this basename: CHOL,  in the last 72 hours No results found for this basename: PROTIME,  in the last 72 hours  Imaging: No results  found.  Cardiac Studies: Tele - nsr Assessment/Plan:  1. Acute diastolic CHF 2. HCM with 100 mg gradient by echo 3. PAF 4. Chronic renal insufficiency Rec: start lasix 40 mg daily. Low sodium diet. Continue amio and calcium channel blockers  LOS: 3 days    Audianna Landgren,M.D. 09/03/2012, 11:11 AM

## 2012-09-04 LAB — GLUCOSE, CAPILLARY: Glucose-Capillary: 117 mg/dL — ABNORMAL HIGH (ref 70–99)

## 2012-09-04 MED ORDER — FUROSEMIDE 20 MG PO TABS: 40.0000 mg | ORAL_TABLET | Freq: Every day | ORAL | Status: DC

## 2012-09-04 MED ORDER — DILTIAZEM HCL ER COATED BEADS 120 MG PO CP24
120.0000 mg | ORAL_CAPSULE | Freq: Every day | ORAL | Status: DC
Start: 2012-09-04 — End: 2012-09-04
  Administered 2012-09-04: 120 mg via ORAL
  Filled 2012-09-04: qty 1

## 2012-09-04 MED ORDER — DILTIAZEM HCL ER COATED BEADS 120 MG PO CP24: 120.0000 mg | ORAL_CAPSULE | Freq: Every day | ORAL | Status: DC

## 2012-09-04 NOTE — Progress Notes (Signed)
   SUBJECTIVE:  She is not SOB.  She has no pain.     PHYSICAL EXAM Filed Vitals:   09/03/12 0448 09/03/12 1430 09/03/12 2023 09/04/12 0425  BP: 126/88 101/70 107/65 101/62  Pulse: 70 76 71 76  Temp: 99.8 F (37.7 C) 100.5 F (38.1 C) 99.8 F (37.7 C) 98.9 F (37.2 C)  TempSrc: Oral Oral Oral Oral  Resp: 20 20 18 20   Height:      Weight: 170 lb 14.4 oz (77.52 kg)   164 lb 3.9 oz (74.5 kg)  SpO2: 98% 92% 92% 95%   General:  No distress Lungs:  Clear Heart:  RRR Abdomen:  Positive bowel sounds, no rebound no guarding Extremities:  No edema.   LABS:  Results for orders placed during the hospital encounter of 08/31/12 (from the past 24 hour(s))  HEMOGLOBIN A1C     Status: Abnormal   Collection Time    09/03/12  8:00 AM      Result Value Range   Hemoglobin A1C 8.0 (*) <5.7 %   Mean Plasma Glucose 183 (*) <117 mg/dL  GLUCOSE, CAPILLARY     Status: Abnormal   Collection Time    09/03/12 11:48 AM      Result Value Range   Glucose-Capillary 127 (*) 70 - 99 mg/dL   Comment 1 Documented in Chart     Comment 2 Notify RN    GLUCOSE, CAPILLARY     Status: Abnormal   Collection Time    09/03/12  4:24 PM      Result Value Range   Glucose-Capillary 120 (*) 70 - 99 mg/dL  GLUCOSE, CAPILLARY     Status: Abnormal   Collection Time    09/03/12  9:29 PM      Result Value Range   Glucose-Capillary 154 (*) 70 - 99 mg/dL  GLUCOSE, CAPILLARY     Status: Abnormal   Collection Time    09/04/12  5:47 AM      Result Value Range   Glucose-Capillary 132 (*) 70 - 99 mg/dL   Comment 1 Notify RN      Intake/Output Summary (Last 24 hours) at 09/04/12 0740 Last data filed at 09/04/12 0500  Gross per 24 hour  Intake    600 ml  Output    375 ml  Net    225 ml     ASSESSMENT AND PLAN:  Acute on chronic diastolic congestive heart failure:  She is tolerating the current dose of diuretic.  Seems to be back to her baseline volume.  Continue current diuretic.   HYPERTENSION:  BP is well  controlled in hospital.  I wonder if compliance might be an issue.  She knows that she needs to get a BP cuff at home.  I will change to Cardizem CD  CHRONIC KIDNEY DISEASE STAGE V:  No creat today.  Had been trending down.    CARDIOMYOPATHY, IDIOPATHIC HYPERTROPHIC:  Continue with BP control, salt and fluid restriction.    CAD:  No evidence of ischemia.  Continue current therapy.    Rollene Rotunda 09/04/2012 7:40 AM

## 2012-09-04 NOTE — Progress Notes (Signed)
Occupational Therapy Treatment Patient Details Name: [name] MRN: [id number] DOB: Oct 17, 1940 Today's Date: 09/04/2012 Time: 4098-1191 OT Time Calculation (min): 20 min  OT Assessment / Plan / Recommendation Comments on Treatment Session Pt was limited by fatigue this session.  Pt had finished PT and did not want want to participate in OT.  Once encouraged pt was willing to complete two grooming task.  Pt O2 sats drooped from 93-89 when ambualted from recliner to sink.  Pt then needed to take a break.  Pt stated she would like to have a shower seat with back for safety and becuase she becomes tired.  Will discuss this with OTR/L.    Follow Up Recommendations  No OT follow up             Frequency Min 2X/week   Plan Discharge plan remains appropriate    Precautions / Restrictions Precautions Precautions: None Restrictions Weight Bearing Restrictions: No            OT Goals ADL Goals ADL Goal: Grooming - Progress: Met ADL Goal: Toilet Transfer - Progress: Progressing toward goals Miscellaneous OT Goals OT Goal: Miscellaneous Goal #1 - Progress: Progressing toward goals  Visit Information  Last OT Received On: 09/04/12 Assistance Needed: +1    Subjective Data  Subjective: I just want to sleep(upon arrival into pt room)      Cognition  Cognition Arousal/Alertness: Awake/alert Behavior During Therapy: WFL for tasks assessed/performed Overall Cognitive Status: Within Functional Limits for tasks assessed    Mobility  Bed Mobility Bed Mobility: Supine to Sit;Sitting - Scoot to Edge of Bed Supine to Sit: 6: Modified independent (Device/Increase time);With rails Sitting - Scoot to Edge of Bed: 6: Modified independent (Device/Increase time) Details for Bed Mobility Assistance: Increased time Transfers Transfers: Sit to Stand;Stand to Sit Sit to Stand: 5: Supervision;With upper extremity assist;From bed;From chair/3-in-1;With armrests Stand to Sit: 5:  Supervision;With upper extremity assist;With armrests;To bed;To chair/3-in-1 Details for Transfer Assistance: Pt performed SPT bed<>3-in-1 without RW.  Cues for hand placement with transfers & for safe body positioning before sitting.         Balance Dynamic Standing Balance Dynamic Standing - Balance Support: No upper extremity supported;During functional activity Dynamic Standing - Level of Assistance: 6: Modified independent (Device/Increase time) Dynamic Standing - Balance Activities: Lateral lean/weight shifting;Reaching for objects Dynamic Standing - Comments:  (while rinsing mouth out and washing face)   End of Session OT - End of Session Equipment Utilized During Treatment: Gait belt Activity Tolerance: Patient tolerated treatment well Patient left: in bed;with call bell/phone within reach Nurse Communication:  (pt wanted liquids/ pt wanted to see daughter down the hall)       Mayford Knife, [place] 09/04/2012, 4:43 PM

## 2012-09-04 NOTE — Progress Notes (Addendum)
DC IV, DC Tele, DC Home. Discharge instructions and home medications discussed with patient. Patient denied any questions or concerns at this time. Patient leaving unit via wheelchair and appears in no acute distress.  

## 2012-09-04 NOTE — Progress Notes (Signed)
Physical Therapy Treatment Patient Details Name: [name] MRN: [id number] DOB: 05-20-1940 Today's Date: 09/04/2012 Time: 0820-0850 PT Time Calculation (min): 30 min  PT Assessment / Plan / Recommendation Comments on Treatment Session   Overall pt moving well.  Cont's to present with decreased activity tolerance but was able to increase ambulation distance today.   Pt's 02 sats maintaining 88-91% RA with activity.      Follow Up Recommendations  No PT follow up     Does the patient have the potential to tolerate intense rehabilitation     Barriers to Discharge        Equipment Recommendations  None recommended by PT    Recommendations for Other Services    Frequency Min 3X/week   Plan Discharge plan remains appropriate    Precautions / Restrictions Precautions Precautions: None Restrictions Weight Bearing Restrictions: No       Mobility  Bed Mobility Bed Mobility: Supine to Sit;Sitting - Scoot to Edge of Bed Supine to Sit: 6: Modified independent (Device/Increase time);With rails Sitting - Scoot to Edge of Bed: 6: Modified independent (Device/Increase time) Sit to Sidelying Right: 6: Modified independent (Device/Increase time);HOB flat Details for Bed Mobility Assistance: Increased time Transfers Transfers: Sit to Stand;Stand to Sit;Stand Pivot Transfers Sit to Stand: 5: Supervision;With upper extremity assist;From bed;From chair/3-in-1;With armrests Stand to Sit: 5: Supervision;With upper extremity assist;With armrests;To bed;To chair/3-in-1 Stand Pivot Transfers: 6: Modified independent (Device/Increase time) Details for Transfer Assistance: Pt performed SPT bed<>3-in-1 without RW.  Cues for hand placement with transfers & for safe body positioning before sitting.   Ambulation/Gait Ambulation/Gait Assistance: 4: Min guard Ambulation Distance (Feet): 60 Feet Assistive device: Rolling walker Ambulation/Gait Assistance Details: Used RW due to rollator unavailable  at this time.  Pt with increased lateral weight shiting/unsteadiness at times.   ambulated without supplemental 02; 02 sats 89-91% RA.  Cues for pursed lip breathing.   Gait Pattern: Step-through pattern;Decreased stride length Gait velocity: decr General Gait Details: As pt was stopping at doorway to assess her 02 sats, she became off balance & had to brace herself against doorframe.   Stairs: No Wheelchair Mobility Wheelchair Mobility: No      PT Goals Acute Rehab PT Goals Time For Goal Achievement: 09/09/12 Potential to Achieve Goals: Good Pt will go Sit to Stand: with modified independence PT Goal: Sit to Stand - Progress: Progressing toward goal Pt will go Stand to Sit: with modified independence PT Goal: Stand to Sit - Progress: Progressing toward goal Pt will Ambulate: 51 - 150 feet;with modified independence;with least restrictive assistive device PT Goal: Ambulate - Progress: Progressing toward goal  Visit Information  Last PT Received On: 09/04/12 Assistance Needed: +1    Subjective Data      Cognition  Cognition Arousal/Alertness: Awake/alert Behavior During Therapy: WFL for tasks assessed/performed Overall Cognitive Status: Within Functional Limits for tasks assessed    Balance  Dynamic Standing Balance Dynamic Standing - Balance Support: No upper extremity supported;During functional activity Dynamic Standing - Level of Assistance: 6: Modified independent (Device/Increase time) Dynamic Standing - Balance Activities: Lateral lean/weight shifting;Reaching for objects Dynamic Standing - Comments: While rising mouth and washing face  End of Session PT - End of Session Equipment Utilized During Treatment: Gait belt Activity Tolerance: Patient tolerated treatment well Patient left: in chair;with call bell/phone within reach (OT present) Nurse Communication: Mobility status    Verdell Face, [place] [phone] 09/04/2012

## 2012-09-04 NOTE — Discharge Summary (Signed)
Physician Discharge Summary  [name] ZOX:[id number] DOB: 1940/10/26 DOA: 08/31/2012  PCP: Dorrene German, MD - new referral   Admit date: 08/31/2012 Discharge date: 09/04/2012  Time spent: 45 minutes  Recommendations for Outpatient Follow-up:  1. Patient was instructed to check her weight daily 2. 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 She was intubated and admitted to the ICU  Hospital Course:  1. Acute respiratory failure secondary to pulmonary edema-status post intubation mechanical ventilation. Extubated April 25 without problems.  2. Hyper trophic obstructive cardiomyopathy-management per  Cardiology During this admission-patient was kept on calcium channel blocker, diuretics. Furosemide 40 mg daily is the dose of diuretic recommended by cardiology to discharge 3. Chronic atrial fibrillation on amiodarone and Coumadin 4. Chronic disease stage IV- Creatinine remained stable During admission 5. Diabetes mellitus type 2 - hemoglobin A1c 8.Patient needs diabetic education and treatment.    Procedures:  Intubation and mechanical ventilation  Consultations:  Critical  care  Cardiology  Discharge Exam: Filed Vitals:   09/03/12 2023 09/04/12 0425 09/04/12 0920 09/04/12 1432  BP: 107/65 101/62 102/65 100/62  Pulse: 71 76 78 66  Temp: 99.8 F (37.7 C) 98.9 F (37.2 C)  99 F (37.2 C)  TempSrc: Oral Oral  Oral  Resp: 18 20  18   Height:      Weight:  74.5 kg (164 lb 3.9 oz)    SpO2: 92% 95%  92%    General: Alert and oriented x3 Cardiovascular: Irregularly irregular Respiratory: Clear to auscultation bilaterally  Discharge Instructions       Future Appointments Provider Department Dept Phone   10/05/2012 10:00 AM [facility] [phone]   10/31/2012 10:10 AM Beatrice Lecher, PA-C [facility] Main Office [place]) [phone]       Medication List    STOP taking these medications       amLODipine 5 MG tablet  Commonly known as:  NORVASC     predniSONE 10 MG tablet  Commonly known as:  STERAPRED UNI-PAK      TAKE these medications       acetaminophen 500 MG tablet  Commonly known as:  TYLENOL  Take 500 mg by mouth 2 (two) times daily as needed for pain.     amiodarone 200 MG tablet  Commonly known as:  PACERONE  Take 1 tablet (200 mg total) by mouth daily.     diltiazem 120 MG 24 hr capsule  Commonly known as:  CARDIZEM CD  Take 1 capsule (120 mg total) by mouth daily.     furosemide 20 MG tablet  Commonly known as:  LASIX  Take 2 tablets (40 mg total) by mouth daily.  nitroGLYCERIN 0.4 MG SL tablet  Commonly known as:  NITROSTAT  Place 0.4 mg under the tongue every 5 (five) minutes as needed for chest pain.     pantoprazole 40 MG tablet  Commonly known as:  PROTONIX  Take 1 tablet (40 mg total) by mouth daily at 12 noon.     rosuvastatin 10 MG tablet  Commonly known as:  CRESTOR  Take 10 mg by mouth at bedtime.     warfarin 5 MG tablet  Commonly known as:  COUMADIN  Take 2.5-5 mg by mouth daily. The patient takes 1/2 tablet (2.5 mg) daily EXCEPT for 1  tablet (5 mg) on Mondays only.       Follow-up Information   Follow up with Dorrene German, MD On 09/13/2012. (at2:15 PM )    Contact information:   [address] [place] [address]        The results of significant diagnostics from this hospitalization (including imaging, microbiology, ancillary and laboratory) are listed below for reference.    Significant Diagnostic Studies: Dg Chest Port 1 View  09/01/2012  *RADIOLOGY REPORT*  Clinical Data: Respiratory failure  PORTABLE CHEST - 1 VIEW  Comparison: Yesterday  Findings: Endotracheal tube, NG tube, right internal jugular central venous catheter are stable.  Diffuse airspace disease has cleared with some residual disease left greater than right.  Lungs remain under aerated.  Cardiomegaly.  No pneumothorax.  IMPRESSION: Improving bilateral airspace disease.   Original Report Authenticated By: Jolaine Click, M.D.    Dg Chest Portable 1 View  08/31/2012  *RADIOLOGY REPORT*  Clinical Data: PICC line placement.  Coronary artery disease. Shortness of breath.  PORTABLE CHEST - 1 VIEW  Comparison: 08/31/2012  Findings: Endotracheal tube terminates at the level of clavicles, 4.7 cm above carina.  Nasogastric extends beyond the  inferior aspect of the film.  Right internal jugular line terminates at the low SVC versus caval atrial junction.  No pneumothorax.  Cardiomegaly accentuated by AP portable technique.  No pleural fluid.  Minimal worsening of moderate interstitial prominence.  IMPRESSION: Right internal jugular line at low SVC versus caval atrial junction.  Retraction of 2 cm would place as well into the low SVC.  Slight worsening in aeration, presumably secondary increased congestive heart failure.  No pneumothorax.   Original Report Authenticated By: Jeronimo Greaves, M.D.    Dg Chest Portable 1 View  08/31/2012  *RADIOLOGY REPORT*  Clinical Data: Respiratory failure and status post intubation.  PORTABLE CHEST - 1 VIEW  Comparison: 06/21/2012   Findings: The patient has been intubated and the endotracheal tube tip is approximately 4 cm above the carina.  A nasogastric tube has also been placed which extends below the diaphragm.  Lungs show diffuse edema, likely reflecting congestive heart failure.  There is stable cardiomegaly.  No large pleural effusions are identified.  IMPRESSION: Satisfactory positioning of endotracheal tube.  Lungs show diffuse CHF and stable cardiomegaly.   Original Report Authenticated By: Irish Lack, M.D.     Microbiology: Recent Results (from the past 240 hour(s))  MRSA PCR SCREENING     Status: Abnormal   Collection Time    08/31/12 11:32 PM      Result Value Range Status   MRSA by PCR POSITIVE (*) NEGATIVE Final   Comment:            The GeneXpert MRSA Assay (FDA     approved for NASAL specimens     only), is one component of a  comprehensive MRSA colonization     surveillance program. It is not     intended to diagnose MRSA     infection nor to guide or     monitor treatment for     MRSA infections.     RESULT CALLED TO, READ BACK BY AND VERIFIED WITH:     ARTIS,J RN 09/01/2012 0530 JORDANS  URINE CULTURE     Status: None   Collection Time    09/01/12  9:30 AM      Result Value Range Status   Specimen Description URINE, RANDOM   Final   Special Requests Normal   Final   Culture  Setup Time 09/01/2012 11:50   Final   Colony Count NO GROWTH   Final   Culture NO GROWTH   Final   Report Status 09/02/2012 FINAL   Final  CULTURE, RESPIRATORY (NON-EXPECTORATED)     Status: None   Collection Time    09/01/12 10:27 AM      Result Value Range Status   Specimen Description TRACHEAL ASPIRATE   Final   Special Requests Normal   Final   Gram Stain     Final   Value: RARE WBC PRESENT, PREDOMINANTLY PMN     RARE SQUAMOUS EPITHELIAL CELLS PRESENT     NO ORGANISMS SEEN   Culture Non-Pathogenic Oropharyngeal-type Flora Isolated.   Final   Report Status 09/03/2012 FINAL   Final     Labs: Basic  Metabolic Panel:  Recent Labs Lab 08/31/12 1908 08/31/12 1931 08/31/12 2126 09/01/12 0500 09/01/12 1635 09/02/12 0530 09/03/12 0550  NA 136 141  --  140 140 139 138  K 3.1* 3.2*  --  3.6 3.3* 3.4* 3.6  CL 104 108  --  107 105 106 106  CO2 18*  --   --  23 24 25 25   GLUCOSE 308* 305*  --  168* 211* 156* 160*  BUN 14 15  --  17 22 23 19   CREATININE 1.98* 1.80*  --  1.95* 2.18* 2.08* 1.87*  CALCIUM 10.8*  --   --  11.1* 11.4* 11.5* 11.2*  MG  --   --  2.0  --   --   --   --   PHOS  --   --  2.6 2.3  --   --   --    Liver Function Tests:  Recent Labs Lab 08/31/12 1908 08/31/12 2126 09/01/12 0500 09/01/12 1635  AST 25 20  --  25  ALT 12 10  --  14  ALKPHOS 166* 139*  --  138*  BILITOT 0.3 0.3  --  0.5  PROT 7.5 6.6  --  7.1  ALBUMIN 3.2* 2.8* 3.1* 3.1*   No results found for this basename: LIPASE, AMYLASE,  in the last 168 hours No results found for this basename: AMMONIA,  in the last 168 hours CBC:  Recent Labs Lab 08/31/12 1908 08/31/12 1931 09/01/12 0500 09/02/12 0530 09/03/12 0550  WBC 9.3  --  11.1* 16.2* 16.5*  NEUTROABS 5.3  --   --   --   --   HGB 9.4* 10.9* 8.7* 8.4* 8.1*  HCT 28.8* 32.0* 26.7* 25.1* 25.3*  MCV 79.3  --  78.1 77.2* 79.6  PLT 271  --  240 247 251   Cardiac Enzymes:  Recent Labs Lab 08/31/12 2120 09/01/12 0245 09/01/12 0845  TROPONINI <0.30 0.31* <0.30   BNP: BNP (last 3 results)  Recent Labs  04/15/12 2000 06/02/12  0148 08/31/12 1908  PROBNP 1866.0* 1173.0* 2232.0*   CBG:  Recent Labs Lab 09/03/12 1148 09/03/12 1624 09/03/12 2129 09/04/12 0547 09/04/12 1112  GLUCAP 127* 120* 154* 132* 117*       Signed:  Phyliss Hulick  [facility] 09/04/2012, 6:35 PM

## 2012-09-04 NOTE — Progress Notes (Signed)
09/04/12 1600 In to speak with pt. about home health services.  Pt. states she has had [facility] in past and was satisfied with their services.  TC to Debbie, with [facility], to give referral for [facility] RN.  Pt. to dc home today. Tera Mater, RN, BSN NCM [phone]

## 2012-09-05 NOTE — Progress Notes (Signed)
I have reviewed and agree with this note.  Cathy Revia Nghiem, OTR/L [phone] 09/05/2012   

## 2012-09-06 ENCOUNTER — Telehealth: Payer: Self-pay | Admitting: Cardiology

## 2012-09-06 NOTE — Telephone Encounter (Signed)
Pt wanted to make sure that she is supposed to be taking Cardizem instead of norvasc.  Confirmed via progress note and hosp d/c summary.

## 2012-09-06 NOTE — Telephone Encounter (Signed)
New problem   Pt called and stated she needed to speak to a nurse/she did not give a reason but hung up before she gave me any information-used # that is in epic

## 2012-09-07 LAB — PTH-RELATED PEPTIDE: PTH-related peptide: 16 pg/mL (ref 14–27)

## 2012-09-12 ENCOUNTER — Other Ambulatory Visit: Payer: Self-pay | Admitting: Cardiology

## 2012-09-12 NOTE — Telephone Encounter (Signed)
Per notes on file, pt is not a Dr. Jens Som pt. Fax Received. Refill Completed. Liesel Peckenpaugh Chowoe (R.M.A)

## 2012-09-23 ENCOUNTER — Other Ambulatory Visit: Payer: Self-pay | Admitting: Cardiology

## 2012-09-25 NOTE — Telephone Encounter (Signed)
..   Requested Prescriptions   Pending Prescriptions Disp Refills  . furosemide (LASIX) 20 MG tablet [Pharmacy Med Name: FUROSEMIDE 20 MG TABLET] 30 tablet 3    Sig: TAKE 2 TABLETS BY MOUTH EVERY DAY

## 2012-10-05 ENCOUNTER — Telehealth: Payer: Self-pay | Admitting: Cardiology

## 2012-10-05 NOTE — Telephone Encounter (Signed)
Orders will be send by [facility] to either Dr Antoine Poche or Dr Jonny Ruiz to sign once she determines who needs to sign them

## 2012-10-05 NOTE — Telephone Encounter (Signed)
[name] will contact [facility] to see why they stopped going out to see pt.  Aware those orders came from PCP Dr Oliver Barre

## 2012-10-05 NOTE — Telephone Encounter (Signed)
New problem   [name] need to know if they can have additional order to continue there visits with pt and schedule a Child psychotherapist. Please call Kriste Basque

## 2012-10-05 NOTE — Telephone Encounter (Signed)
New problem    Thinks pt needs home health and transportation

## 2012-10-31 ENCOUNTER — Encounter (HOSPITAL_COMMUNITY): Payer: Self-pay | Admitting: Emergency Medicine

## 2012-10-31 ENCOUNTER — Encounter: Payer: Medicare Other | Admitting: Physician Assistant

## 2012-10-31 ENCOUNTER — Emergency Department (HOSPITAL_COMMUNITY)
Admission: EM | Admit: 2012-10-31 | Discharge: 2012-11-01 | Disposition: A | Discharge status: Home / Self Care | Payer: Medicare Other | Attending: Emergency Medicine | Admitting: Emergency Medicine

## 2012-10-31 ENCOUNTER — Emergency Department (HOSPITAL_COMMUNITY): Payer: Medicare Other

## 2012-10-31 DIAGNOSIS — Z79899 Other long term (current) drug therapy: Secondary | ICD-10-CM | POA: Insufficient documentation

## 2012-10-31 DIAGNOSIS — Z862 Personal history of diseases of the blood and blood-forming organs and certain disorders involving the immune mechanism: Secondary | ICD-10-CM | POA: Insufficient documentation

## 2012-10-31 DIAGNOSIS — Z8679 Personal history of other diseases of the circulatory system: Secondary | ICD-10-CM | POA: Insufficient documentation

## 2012-10-31 DIAGNOSIS — E785 Hyperlipidemia, unspecified: Secondary | ICD-10-CM | POA: Insufficient documentation

## 2012-10-31 DIAGNOSIS — I4891 Unspecified atrial fibrillation: Secondary | ICD-10-CM | POA: Insufficient documentation

## 2012-10-31 DIAGNOSIS — Z8639 Personal history of other endocrine, nutritional and metabolic disease: Secondary | ICD-10-CM | POA: Insufficient documentation

## 2012-10-31 DIAGNOSIS — I129 Hypertensive chronic kidney disease with stage 1 through stage 4 chronic kidney disease, or unspecified chronic kidney disease: Secondary | ICD-10-CM | POA: Insufficient documentation

## 2012-10-31 DIAGNOSIS — Z8619 Personal history of other infectious and parasitic diseases: Secondary | ICD-10-CM | POA: Insufficient documentation

## 2012-10-31 DIAGNOSIS — N184 Chronic kidney disease, stage 4 (severe): Secondary | ICD-10-CM | POA: Insufficient documentation

## 2012-10-31 DIAGNOSIS — Z87891 Personal history of nicotine dependence: Secondary | ICD-10-CM | POA: Insufficient documentation

## 2012-10-31 DIAGNOSIS — I2510 Atherosclerotic heart disease of native coronary artery without angina pectoris: Secondary | ICD-10-CM | POA: Insufficient documentation

## 2012-10-31 DIAGNOSIS — Z8742 Personal history of other diseases of the female genital tract: Secondary | ICD-10-CM | POA: Insufficient documentation

## 2012-10-31 DIAGNOSIS — I252 Old myocardial infarction: Secondary | ICD-10-CM | POA: Insufficient documentation

## 2012-10-31 DIAGNOSIS — E1129 Type 2 diabetes mellitus with other diabetic kidney complication: Secondary | ICD-10-CM | POA: Insufficient documentation

## 2012-10-31 DIAGNOSIS — Z7901 Long term (current) use of anticoagulants: Secondary | ICD-10-CM | POA: Insufficient documentation

## 2012-10-31 DIAGNOSIS — Z8614 Personal history of Methicillin resistant Staphylococcus aureus infection: Secondary | ICD-10-CM | POA: Insufficient documentation

## 2012-10-31 DIAGNOSIS — I5042 Chronic combined systolic (congestive) and diastolic (congestive) heart failure: Secondary | ICD-10-CM | POA: Insufficient documentation

## 2012-10-31 LAB — BASIC METABOLIC PANEL
CO2: 22 mEq/L (ref 19–32)
Chloride: 105 mEq/L (ref 96–112)
GFR calc Af Amer: 30 mL/min — ABNORMAL LOW (ref >90)
Sodium: 137 mEq/L (ref 135–145)

## 2012-10-31 LAB — PRO B NATRIURETIC PEPTIDE: Pro B Natriuretic peptide (BNP): 1008 pg/mL — ABNORMAL HIGH (ref 0–125)

## 2012-10-31 LAB — CBC
Platelets: 284 10*3/uL (ref 150–400)
RBC: 4.02 MIL/uL (ref 3.87–5.11)
RDW: 18.1 % — ABNORMAL HIGH (ref 11.5–15.5)
WBC: 10 10*3/uL (ref 4.0–10.5)

## 2012-10-31 LAB — POCT I-STAT TROPONIN I: Troponin i, poc: 0.07 ng/mL (ref 0.00–0.08)

## 2012-10-31 MED ORDER — FUROSEMIDE 10 MG/ML IJ SOLN
40.0000 mg | Freq: Once | INTRAMUSCULAR | Status: AC
  Administered 2012-11-01: 40 mg via INTRAVENOUS
  Filled 2012-10-31: qty 4
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
## 2012-10-31 NOTE — ED Notes (Signed)
EKG completed and given to Dr. Ignacia Palma along with OLD ekg.
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
## 2012-10-31 NOTE — Progress Notes (Signed)
This encounter was created in error - please disregard.

## 2012-11-01 MED ORDER — FUROSEMIDE 40 MG PO TABS: 40.0000 mg | ORAL_TABLET | Freq: Every day | ORAL | Status: DC

## 2012-11-09 ENCOUNTER — Inpatient Hospital Stay (HOSPITAL_COMMUNITY): Payer: Medicare Other

## 2012-11-09 ENCOUNTER — Emergency Department (HOSPITAL_COMMUNITY): Payer: Medicare Other

## 2012-11-09 ENCOUNTER — Inpatient Hospital Stay (HOSPITAL_COMMUNITY)
Admission: EM | Admit: 2012-11-09 | Discharge: 2012-12-22 | DRG: 004 | Disposition: A | Discharge status: Other Acute Inpatient Hospital | Payer: Medicare Other | Attending: Pulmonary Disease | Admitting: Pulmonary Disease

## 2012-11-09 ENCOUNTER — Encounter (HOSPITAL_COMMUNITY): Payer: Self-pay | Admitting: Cardiology

## 2012-11-09 DIAGNOSIS — E870 Hyperosmolality and hypernatremia: Secondary | ICD-10-CM

## 2012-11-09 DIAGNOSIS — E46 Unspecified protein-calorie malnutrition: Secondary | ICD-10-CM | POA: Diagnosis not present

## 2012-11-09 DIAGNOSIS — Z9119 Patient's noncompliance with other medical treatment and regimen: Secondary | ICD-10-CM

## 2012-11-09 DIAGNOSIS — E119 Type 2 diabetes mellitus without complications: Secondary | ICD-10-CM

## 2012-11-09 DIAGNOSIS — I252 Old myocardial infarction: Secondary | ICD-10-CM

## 2012-11-09 DIAGNOSIS — Z7901 Long term (current) use of anticoagulants: Secondary | ICD-10-CM

## 2012-11-09 DIAGNOSIS — I428 Other cardiomyopathies: Secondary | ICD-10-CM

## 2012-11-09 DIAGNOSIS — F411 Generalized anxiety disorder: Secondary | ICD-10-CM | POA: Diagnosis present

## 2012-11-09 DIAGNOSIS — I421 Obstructive hypertrophic cardiomyopathy: Secondary | ICD-10-CM

## 2012-11-09 DIAGNOSIS — I5031 Acute diastolic (congestive) heart failure: Secondary | ICD-10-CM

## 2012-11-09 DIAGNOSIS — N289 Disorder of kidney and ureter, unspecified: Secondary | ICD-10-CM

## 2012-11-09 DIAGNOSIS — I2510 Atherosclerotic heart disease of native coronary artery without angina pectoris: Secondary | ICD-10-CM

## 2012-11-09 DIAGNOSIS — I509 Heart failure, unspecified: Secondary | ICD-10-CM

## 2012-11-09 DIAGNOSIS — J9690 Respiratory failure, unspecified, unspecified whether with hypoxia or hypercapnia: Secondary | ICD-10-CM

## 2012-11-09 DIAGNOSIS — Z0000 Encounter for general adult medical examination without abnormal findings: Secondary | ICD-10-CM

## 2012-11-09 DIAGNOSIS — I5033 Acute on chronic diastolic (congestive) heart failure: Principal | ICD-10-CM

## 2012-11-09 DIAGNOSIS — J9600 Acute respiratory failure, unspecified whether with hypoxia or hypercapnia: Secondary | ICD-10-CM

## 2012-11-09 DIAGNOSIS — A0472 Enterocolitis due to Clostridium difficile, not specified as recurrent: Secondary | ICD-10-CM

## 2012-11-09 DIAGNOSIS — I10 Essential (primary) hypertension: Secondary | ICD-10-CM

## 2012-11-09 DIAGNOSIS — R6521 Severe sepsis with septic shock: Secondary | ICD-10-CM | POA: Diagnosis present

## 2012-11-09 DIAGNOSIS — R1310 Dysphagia, unspecified: Secondary | ICD-10-CM | POA: Diagnosis present

## 2012-11-09 DIAGNOSIS — Z930 Tracheostomy status: Secondary | ICD-10-CM

## 2012-11-09 DIAGNOSIS — N185 Chronic kidney disease, stage 5: Secondary | ICD-10-CM

## 2012-11-09 DIAGNOSIS — I161 Hypertensive emergency: Secondary | ICD-10-CM

## 2012-11-09 DIAGNOSIS — D509 Iron deficiency anemia, unspecified: Secondary | ICD-10-CM | POA: Diagnosis present

## 2012-11-09 DIAGNOSIS — N179 Acute kidney failure, unspecified: Secondary | ICD-10-CM | POA: Diagnosis not present

## 2012-11-09 DIAGNOSIS — J9601 Acute respiratory failure with hypoxia: Secondary | ICD-10-CM

## 2012-11-09 DIAGNOSIS — R0602 Shortness of breath: Secondary | ICD-10-CM

## 2012-11-09 DIAGNOSIS — N183 Chronic kidney disease, stage 3 (moderate): Secondary | ICD-10-CM

## 2012-11-09 DIAGNOSIS — A419 Sepsis, unspecified organism: Secondary | ICD-10-CM | POA: Diagnosis present

## 2012-11-09 DIAGNOSIS — IMO0001 Reserved for inherently not codable concepts without codable children: Secondary | ICD-10-CM | POA: Diagnosis not present

## 2012-11-09 DIAGNOSIS — T380X5A Adverse effect of glucocorticoids and synthetic analogues, initial encounter: Secondary | ICD-10-CM | POA: Diagnosis not present

## 2012-11-09 DIAGNOSIS — E876 Hypokalemia: Secondary | ICD-10-CM

## 2012-11-09 DIAGNOSIS — J690 Pneumonitis due to inhalation of food and vomit: Secondary | ICD-10-CM | POA: Diagnosis not present

## 2012-11-09 DIAGNOSIS — E872 Acidosis, unspecified: Secondary | ICD-10-CM

## 2012-11-09 DIAGNOSIS — I5032 Chronic diastolic (congestive) heart failure: Secondary | ICD-10-CM

## 2012-11-09 DIAGNOSIS — R739 Hyperglycemia, unspecified: Secondary | ICD-10-CM

## 2012-11-09 DIAGNOSIS — L89309 Pressure ulcer of unspecified buttock, unspecified stage: Secondary | ICD-10-CM | POA: Diagnosis present

## 2012-11-09 DIAGNOSIS — Z8614 Personal history of Methicillin resistant Staphylococcus aureus infection: Secondary | ICD-10-CM

## 2012-11-09 DIAGNOSIS — N184 Chronic kidney disease, stage 4 (severe): Secondary | ICD-10-CM

## 2012-11-09 DIAGNOSIS — K869 Disease of pancreas, unspecified: Secondary | ICD-10-CM | POA: Diagnosis present

## 2012-11-09 DIAGNOSIS — G9340 Encephalopathy, unspecified: Secondary | ICD-10-CM | POA: Diagnosis not present

## 2012-11-09 DIAGNOSIS — L8992 Pressure ulcer of unspecified site, stage 2: Secondary | ICD-10-CM | POA: Diagnosis present

## 2012-11-09 DIAGNOSIS — F17200 Nicotine dependence, unspecified, uncomplicated: Secondary | ICD-10-CM

## 2012-11-09 DIAGNOSIS — N189 Chronic kidney disease, unspecified: Secondary | ICD-10-CM

## 2012-11-09 DIAGNOSIS — N390 Urinary tract infection, site not specified: Secondary | ICD-10-CM | POA: Diagnosis present

## 2012-11-09 DIAGNOSIS — E785 Hyperlipidemia, unspecified: Secondary | ICD-10-CM

## 2012-11-09 DIAGNOSIS — J449 Chronic obstructive pulmonary disease, unspecified: Secondary | ICD-10-CM

## 2012-11-09 DIAGNOSIS — Z515 Encounter for palliative care: Secondary | ICD-10-CM

## 2012-11-09 DIAGNOSIS — I5022 Chronic systolic (congestive) heart failure: Secondary | ICD-10-CM

## 2012-11-09 DIAGNOSIS — E2749 Other adrenocortical insufficiency: Secondary | ICD-10-CM | POA: Diagnosis present

## 2012-11-09 DIAGNOSIS — I447 Left bundle-branch block, unspecified: Secondary | ICD-10-CM | POA: Diagnosis present

## 2012-11-09 DIAGNOSIS — I4891 Unspecified atrial fibrillation: Secondary | ICD-10-CM

## 2012-11-09 DIAGNOSIS — I120 Hypertensive chronic kidney disease with stage 5 chronic kidney disease or end stage renal disease: Secondary | ICD-10-CM | POA: Diagnosis present

## 2012-11-09 DIAGNOSIS — R5381 Other malaise: Secondary | ICD-10-CM

## 2012-11-09 DIAGNOSIS — I5021 Acute systolic (congestive) heart failure: Secondary | ICD-10-CM

## 2012-11-09 DIAGNOSIS — J810 Acute pulmonary edema: Secondary | ICD-10-CM

## 2012-11-09 DIAGNOSIS — Z91199 Patient's noncompliance with other medical treatment and regimen due to unspecified reason: Secondary | ICD-10-CM

## 2012-11-09 DIAGNOSIS — Z6832 Body mass index (BMI) 32.0-32.9, adult: Secondary | ICD-10-CM

## 2012-11-09 DIAGNOSIS — N814 Uterovaginal prolapse, unspecified: Secondary | ICD-10-CM | POA: Diagnosis present

## 2012-11-09 DIAGNOSIS — D638 Anemia in other chronic diseases classified elsewhere: Secondary | ICD-10-CM | POA: Diagnosis present

## 2012-11-09 DIAGNOSIS — D696 Thrombocytopenia, unspecified: Secondary | ICD-10-CM | POA: Diagnosis present

## 2012-11-09 LAB — POCT I-STAT 3, ART BLOOD GAS (G3+)
Bicarbonate: 19.9 mEq/L — ABNORMAL LOW (ref 20.0–24.0)
O2 Saturation: 83 %
O2 Saturation: 98 %
Patient temperature: 98.6
Patient temperature: 98.4
TCO2: 21 mmol/L (ref 0–100)
TCO2: 22 mmol/L (ref 0–100)
pCO2 arterial: 68.4 mmHg (ref 35.0–45.0)
pCO2 arterial: 46.7 mmHg — ABNORMAL HIGH (ref 35.0–45.0)
pCO2 arterial: 49.8 mmHg — ABNORMAL HIGH (ref 35.0–45.0)
pH, Arterial: 7.109 — CL (ref 7.350–7.450)
pH, Arterial: 7.238 — ABNORMAL LOW (ref 7.350–7.450)
pO2, Arterial: 64 mmHg — ABNORMAL LOW (ref 80.0–100.0)

## 2012-11-09 LAB — CBC WITH DIFFERENTIAL/PLATELET
Eosinophils Absolute: 0.1 10*3/uL (ref 0.0–0.7)
Eosinophils Relative: 1 % (ref 0–5)
Lymphs Abs: 2.8 10*3/uL (ref 0.7–4.0)
MCH: 25.8 pg — ABNORMAL LOW (ref 26.0–34.0)
MCV: 80.3 fL (ref 78.0–100.0)
Platelets: 271 10*3/uL (ref 150–400)
RBC: 3.95 MIL/uL (ref 3.87–5.11)
RDW: 18.2 % — ABNORMAL HIGH (ref 11.5–15.5)

## 2012-11-09 LAB — POCT I-STAT, CHEM 8
Calcium, Ion: 1.43 mmol/L — ABNORMAL HIGH (ref 1.13–1.30)
Hemoglobin: 11.6 g/dL — ABNORMAL LOW (ref 12.0–15.0)
Sodium: 141 mEq/L (ref 135–145)
TCO2: 20 mmol/L (ref 0–100)

## 2012-11-09 LAB — COMPREHENSIVE METABOLIC PANEL
ALT: 15 U/L (ref 0–35)
Calcium: 10.1 mg/dL (ref 8.4–10.5)
Creatinine, Ser: 1.86 mg/dL — ABNORMAL HIGH (ref 0.50–1.10)
GFR calc Af Amer: 30 mL/min — ABNORMAL LOW (ref >90)
Glucose, Bld: 274 mg/dL — ABNORMAL HIGH (ref 70–99)
Sodium: 137 mEq/L (ref 135–145)
Total Protein: 7.5 g/dL (ref 6.0–8.3)

## 2012-11-09 LAB — URINALYSIS, ROUTINE W REFLEX MICROSCOPIC
Ketones, ur: NEGATIVE mg/dL
Leukocytes, UA: NEGATIVE
Nitrite: POSITIVE — AB
Protein, ur: 100 mg/dL — AB
Urobilinogen, UA: 0.2 mg/dL (ref 0.0–1.0)

## 2012-11-09 LAB — POCT I-STAT TROPONIN I: Troponin i, poc: 0.04 ng/mL (ref 0.00–0.08)

## 2012-11-09 LAB — LACTIC ACID, PLASMA: Lactic Acid, Venous: 2.3 mmol/L — ABNORMAL HIGH (ref 0.5–2.2)

## 2012-11-09 LAB — URINE MICROSCOPIC-ADD ON

## 2012-11-09 MED ORDER — NITROGLYCERIN IN D5W 200-5 MCG/ML-% IV SOLN
2.0000 ug/min | Freq: Once | INTRAVENOUS | Status: DC
  Filled 2012-11-09: qty 250

## 2012-11-09 MED ORDER — PROPOFOL 10 MG/ML IV EMUL: 5.0000 ug/kg/min | INTRAVENOUS | Status: DC

## 2012-11-09 MED ORDER — SODIUM CHLORIDE 0.9 % IJ SOLN
3.0000 mL | Freq: Two times a day (BID) | INTRAMUSCULAR | Status: DC
  Administered 2012-11-09 – 2012-11-12 (×7): 3 mL via INTRAVENOUS
  Administered 2012-11-13: 10 mL via INTRAVENOUS
  Administered 2012-11-14: 3 mL via INTRAVENOUS

## 2012-11-09 MED ORDER — FENTANYL BOLUS VIA INFUSION
25.0000 ug | Freq: Four times a day (QID) | INTRAVENOUS | Status: DC | PRN
  Administered 2012-11-13: 25 ug via INTRAVENOUS
  Administered 2012-11-14 (×2): 50 ug via INTRAVENOUS
  Filled 2012-11-09: qty 100

## 2012-11-09 MED ORDER — AMLODIPINE BESYLATE 5 MG PO TABS
5.0000 mg | ORAL_TABLET | Freq: Every day | ORAL | Status: DC
  Administered 2012-11-10 – 2012-11-11 (×2): 5 mg via ORAL
  Filled 2012-11-09 (×3): qty 1

## 2012-11-09 MED ORDER — PROPOFOL 10 MG/ML IV EMUL
INTRAVENOUS | Status: AC
  Filled 2012-11-09: qty 100

## 2012-11-09 MED ORDER — INSULIN ASPART 100 UNIT/ML ~~LOC~~ SOLN
1.0000 [IU] | SUBCUTANEOUS | Status: DC
  Administered 2012-11-09 – 2012-11-11 (×7): 1 [IU] via SUBCUTANEOUS
  Administered 2012-11-12: 2 [IU] via SUBCUTANEOUS
  Administered 2012-11-12: 1 [IU] via SUBCUTANEOUS
  Administered 2012-11-12: 3 [IU] via SUBCUTANEOUS
  Administered 2012-11-12: 2 [IU] via SUBCUTANEOUS
  Administered 2012-11-12: 3 [IU] via SUBCUTANEOUS
  Administered 2012-11-12: 2 [IU] via SUBCUTANEOUS
  Administered 2012-11-13 (×4): 3 [IU] via SUBCUTANEOUS
  Administered 2012-11-13: 2 [IU] via SUBCUTANEOUS
  Administered 2012-11-13: 1 [IU] via SUBCUTANEOUS
  Administered 2012-11-14: 3 [IU] via SUBCUTANEOUS
  Administered 2012-11-14: 2 [IU] via SUBCUTANEOUS
  Administered 2012-11-14: 3 [IU] via SUBCUTANEOUS
  Administered 2012-11-14 (×2): 2 [IU] via SUBCUTANEOUS

## 2012-11-09 MED ORDER — SODIUM CHLORIDE 0.9 % IV SOLN
25.0000 ug/h | INTRAVENOUS | Status: DC
  Administered 2012-11-09: 175 ug/h via INTRAVENOUS
  Administered 2012-11-10 – 2012-11-11 (×2): 100 ug/h via INTRAVENOUS
  Administered 2012-11-12: 175 ug/h via INTRAVENOUS
  Administered 2012-11-13: 100 ug/h via INTRAVENOUS
  Administered 2012-11-13: 200 ug/h via INTRAVENOUS
  Administered 2012-11-14: 175 ug/h via INTRAVENOUS
  Administered 2012-11-14: 100 ug/h via INTRAVENOUS
  Administered 2012-11-15: 150 ug/h via INTRAVENOUS
  Administered 2012-11-16: 75 ug/h via INTRAVENOUS
  Administered 2012-11-16: 150 ug/h via INTRAVENOUS
  Filled 2012-11-09 (×9): qty 50

## 2012-11-09 MED ORDER — AMIODARONE HCL 200 MG PO TABS
200.0000 mg | ORAL_TABLET | Freq: Every day | ORAL | Status: DC
  Administered 2012-11-10 – 2012-11-17 (×8): 200 mg via ORAL
  Filled 2012-11-09 (×8): qty 1

## 2012-11-09 MED ORDER — ROCURONIUM BROMIDE 50 MG/5ML IV SOLN
50.0000 mg | Freq: Once | INTRAVENOUS | Status: AC
  Administered 2012-11-09: 50 mg via INTRAVENOUS
  Filled 2012-11-09: qty 5

## 2012-11-09 MED ORDER — FUROSEMIDE 10 MG/ML IJ SOLN
40.0000 mg | Freq: Once | INTRAMUSCULAR | Status: AC
  Administered 2012-11-09: 40 mg via INTRAVENOUS
  Filled 2012-11-09: qty 4

## 2012-11-09 MED ORDER — FUROSEMIDE 10 MG/ML IJ SOLN
40.0000 mg | Freq: Four times a day (QID) | INTRAMUSCULAR | Status: AC
  Filled 2012-11-09: qty 4

## 2012-11-09 MED ORDER — ALBUTEROL SULFATE HFA 108 (90 BASE) MCG/ACT IN AERS
6.0000 | INHALATION_SPRAY | RESPIRATORY_TRACT | Status: DC
  Administered 2012-11-09 – 2012-11-12 (×15): 6 via RESPIRATORY_TRACT
  Filled 2012-11-09 (×2): qty 6.7

## 2012-11-09 MED ORDER — SODIUM CHLORIDE 0.9 % IV SOLN
25.0000 ug/h | INTRAVENOUS | Status: DC
  Administered 2012-11-09: 100 ug/h via INTRAVENOUS
  Filled 2012-11-09: qty 50

## 2012-11-09 MED ORDER — ATORVASTATIN CALCIUM 20 MG PO TABS
20.0000 mg | ORAL_TABLET | Freq: Every day | ORAL | Status: DC
  Administered 2012-11-10 – 2012-11-21 (×12): 20 mg via ORAL
  Filled 2012-11-09 (×13): qty 1

## 2012-11-09 MED ORDER — ONDANSETRON HCL 4 MG/2ML IJ SOLN
INTRAMUSCULAR | Status: AC
  Administered 2012-11-09: 4 mg
  Filled 2012-11-09: qty 2

## 2012-11-09 MED ORDER — PANTOPRAZOLE SODIUM 40 MG IV SOLR
40.0000 mg | Freq: Every day | INTRAVENOUS | Status: DC
  Administered 2012-11-09 – 2012-11-13 (×5): 40 mg via INTRAVENOUS
  Filled 2012-11-09 (×6): qty 40

## 2012-11-09 MED ORDER — ROCURONIUM BROMIDE 50 MG/5ML IV SOLN
INTRAVENOUS | Status: AC
  Filled 2012-11-09: qty 2

## 2012-11-09 MED ORDER — ONDANSETRON HCL 4 MG/5ML PO SOLN
4.0000 mg | Freq: Three times a day (TID) | ORAL | Status: DC | PRN
  Filled 2012-11-09: qty 10

## 2012-11-09 MED ORDER — METOPROLOL TARTRATE 12.5 MG HALF TABLET
12.5000 mg | ORAL_TABLET | Freq: Two times a day (BID) | ORAL | Status: DC
  Administered 2012-11-10 – 2012-11-11 (×3): 12.5 mg via ORAL
  Filled 2012-11-09 (×7): qty 1

## 2012-11-09 MED ORDER — PROPOFOL 10 MG/ML IV EMUL
5.0000 ug/kg/min | INTRAVENOUS | Status: DC
  Administered 2012-11-09: 5 ug/kg/min via INTRAVENOUS

## 2012-11-09 MED ORDER — CEFTRIAXONE SODIUM 1 G IJ SOLR: 1.0000 g | Freq: Once | INTRAMUSCULAR | Status: DC

## 2012-11-09 MED ORDER — ETOMIDATE 2 MG/ML IV SOLN
20.0000 mg | Freq: Once | INTRAVENOUS | Status: AC
  Administered 2012-11-09: 20 mg via INTRAVENOUS

## 2012-11-09 MED ORDER — ETOMIDATE 2 MG/ML IV SOLN
INTRAVENOUS | Status: AC
  Filled 2012-11-09: qty 20

## 2012-11-09 MED ORDER — LIDOCAINE HCL (CARDIAC) 20 MG/ML IV SOLN
INTRAVENOUS | Status: AC
  Filled 2012-11-09: qty 5

## 2012-11-09 MED ORDER — SODIUM CHLORIDE 0.9 % IV SOLN
250.0000 mL | INTRAVENOUS | Status: DC | PRN
  Administered 2012-11-10: 250 mL via INTRAVENOUS

## 2012-11-09 MED ORDER — ALBUTEROL SULFATE HFA 108 (90 BASE) MCG/ACT IN AERS
6.0000 | INHALATION_SPRAY | RESPIRATORY_TRACT | Status: DC | PRN
  Filled 2012-11-09: qty 6.7

## 2012-11-09 MED ORDER — DEXTROSE 5 % IV SOLN
1.0000 g | Freq: Once | INTRAVENOUS | Status: AC
  Administered 2012-11-09: 1 g via INTRAVENOUS
  Filled 2012-11-09: qty 10

## 2012-11-09 MED ORDER — SUCCINYLCHOLINE CHLORIDE 20 MG/ML IJ SOLN
INTRAMUSCULAR | Status: AC
  Filled 2012-11-09: qty 1

## 2012-11-09 MED ORDER — SODIUM CHLORIDE 0.9 % IJ SOLN: 3.0000 mL | INTRAMUSCULAR | Status: DC | PRN

## 2012-11-09 NOTE — ED Notes (Signed)
Daughter- [phone]

## 2012-11-09 NOTE — Progress Notes (Signed)
ANTICOAGULATION CONSULT NOTE - Initial Consult  Pharmacy Consult for coumadin Indication: atrial fibrillation  No Known Allergies  Patient Measurements: Height: 5\' 4"  (162.6 cm) Weight: 164 lb 3.9 oz (74.5 kg) IBW/kg (Calculated) : 54.7  Vital Signs: Temp: 98.6 F (37 C) (07/03 2045) BP: 149/86 mmHg (07/03 2045) Pulse Rate: 56 (07/03 2045)  Labs:  Recent Labs  11/09/12 1828 11/09/12 1849  HGB 10.2* 11.6*  HCT 31.7* 34.0*  PLT 271  --   CREATININE 1.86* 1.80*    Estimated Creatinine Clearance: 27.9 ml/min (by C-G formula based on Cr of 1.8).   Medical History: Past Medical History  Diagnosis Date  . CAD (coronary artery disease)     a. Nonobst by cath 11/11: LAD 40%, mid-dist 25-30%; prox CFX 30%, mid 40%; OM 50%; PDA 50%; PLV 50%;  EF 65%.  . NSTEMI (non-ST elevated myocardial infarction)     a. In setting of AFib with RVR 03/2010, type 2. b. Again in 11/2011 felt 2/2 afib.  . Atrial fibrillation     a. DCCV 11/17/11. b. On coumadin & amiodarone.  Marland Kitchen HOCM (hypertrophic obstructive cardiomyopathy)     a. Echo 11/2011:severe LVH, EF 65-70%, mid-cavity gradient up to . b. Echo 06/2012: EF 65-70%, severe LVH, grade 2 d/dysf.  Marland Kitchen Hypertension   . Tobacco abuse   . CKD (chronic kidney disease), stage IV   . Diastolic CHF     preserved LVF  . Iron deficiency anemia   . Third degree uterine prolapse   . Hx MRSA infection   . Hx of cardiovascular stress test     a. Lex MV 12/13:  EF 56%, no ischemia    . Diabetes   . Hyperlipidemia   . Respiratory failure     Multiple admissions for resp failure requiring intubation.  . Hypercalcemia   . LBBB (left bundle branch block)     History of transient LBBB  . C. difficile colitis 01/2012     Assessment: 72 yo female here with HF and on coumadin PTA for afib. Pharmacy has been asked to dose coumadin while patient is admitted.  Home coumadin dose: 2.5mg /day except take 5mg  on Monday (last dose not known as patient  intubated).  Goal of Therapy:  INR 2-3 Monitor platelets by anticoagulation protocol: Yes   Plan:  -Will await INR prior to coumadin dosing -Daily PT/INR  Harland German, Pharm D 11/09/2012 9:07 PM

## 2012-11-09 NOTE — ED Provider Notes (Signed)
History    CSN: [id number] Arrival date & time 11/09/12  1700  First MD Initiated Contact with Patient 11/09/12 1702     Chief Complaint  Patient presents with  . Respiratory Distress   (Consider location/radiation/quality/duration/timing/severity/associated sxs/prior Treatment) HPI Comments: Per EMS pt was at [employer] reports shopping and developed sudden onset resp distress. Hx of CHF, COPD, CAD, A-fib, HOCM, HTN, CKD. Was placed on c-pap and given sl nitro en route. On arrival sats in low 80s on cpap, HTN, tachycardia. Severe resp distress. Non verbal, following commands.  Patient is a 72 y.o. female presenting with general illness. The history is provided by the EMS personnel. The history is limited by the condition of the patient.  Illness Location:  Cardio/pulm Quality:  Dyspnea Severity:  Severe Onset quality:  Sudden Timing:  Constant Progression:  Worsening Chronicity:  Recurrent  Past Medical History  Diagnosis Date  . CAD (coronary artery disease)     a. Nonobst by cath 11/11: LAD 40%, mid-dist 25-30%; prox CFX 30%, mid 40%; OM 50%; PDA 50%; PLV 50%;  EF 65%.  . NSTEMI (non-ST elevated myocardial infarction)     a. In setting of AFib with RVR 03/2010, type 2. b. Again in 11/2011 felt 2/2 afib.  . Atrial fibrillation     a. DCCV 11/17/11. b. On coumadin & amiodarone.  Marland Kitchen HOCM (hypertrophic obstructive cardiomyopathy)     a. Echo 11/2011:severe LVH, EF 65-70%, mid-cavity gradient up to . b. Echo 06/2012: EF 65-70%, severe LVH, grade 2 d/dysf.  Marland Kitchen Hypertension   . Tobacco abuse   . CKD (chronic kidney disease), stage IV   . Diastolic CHF     preserved LVF  . Iron deficiency anemia   . Third degree uterine prolapse   . Hx MRSA infection   . Hx of cardiovascular stress test     a. Lex MV 12/13:  EF 56%, no ischemia    . Diabetes   . Hyperlipidemia   . Respiratory failure     Multiple admissions for resp failure requiring intubation.  . Hypercalcemia   . LBBB  (left bundle branch block)     History of transient LBBB  . C. difficile colitis 01/2012   Past Surgical History  Procedure Laterality Date  . Tubal ligation    . Cardioversion  11/17/2011    Procedure: CARDIOVERSION;  Surgeon: Hillis Range, MD;  Location: [facility] OR;  Service: Cardiovascular;  Laterality: N/A;   Family History  Problem Relation Age of Onset  . Coronary artery disease Neg Hx   . Atrial fibrillation Neg Hx   . Diabetes Daughter    History  Substance Use Topics  . Smoking status: Former Smoker -- 0.25 packs/day for 30 years    Types: Cigarettes    Quit date: 02/02/2012  . Smokeless tobacco: Never Used     Comment: quit 01/2012  . Alcohol Use: No   OB History   Grav Para Term Preterm Abortions TAB SAB Ect Mult Living                 Review of Systems  Unable to perform ROS   Allergies  Review of patient's allergies indicates no known allergies.  Home Medications   Current Outpatient Rx  Name  Route  Sig  Dispense  Refill  . acetaminophen (TYLENOL) 500 MG tablet   Oral   Take 500 mg by mouth 2 (two) times daily as needed for pain.          Marland Kitchen  amiodarone (PACERONE) 200 MG tablet   Oral   Take 1 tablet (200 mg total) by mouth daily.   30 tablet   5   . amLODipine (NORVASC) 5 MG tablet   Oral   Take 5 mg by mouth daily.         . furosemide (LASIX) 20 MG tablet   Oral   Take 2 tablets (40 mg total) by mouth daily.   30 tablet   0   . furosemide (LASIX) 40 MG tablet   Oral   Take 1 tablet (40 mg total) by mouth daily.   30 tablet   0   . metoprolol tartrate (LOPRESSOR) 25 MG tablet   Oral   Take 25 mg by mouth 2 (two) times daily.         . nitroGLYCERIN (NITROSTAT) 0.4 MG SL tablet   Sublingual   Place 0.4 mg under the tongue every 5 (five) minutes as needed for chest pain.          . pantoprazole (PROTONIX) 40 MG tablet   Oral   Take 1 tablet (40 mg total) by mouth daily at 12 noon.   30 tablet   6   . rosuvastatin (CRESTOR)  10 MG tablet   Oral   Take 10 mg by mouth at bedtime.          Marland Kitchen warfarin (COUMADIN) 5 MG tablet   Oral   Take 2.5-5 mg by mouth at bedtime. The patient takes 1/2 tablet (2.5 mg) daily EXCEPT for 1 tablet (5 mg) on Mondays only.          BP 170/102  Pulse 90  Resp 14  Ht 5\' 4"  (1.626 m)  Wt 164 lb 3.9 oz (74.5 kg)  BMI 28.18 kg/m2  SpO2 90% Physical Exam  Vitals reviewed. Constitutional: She is oriented to person, place, and time. She appears well-developed and well-nourished. She appears listless. She appears ill. She appears distressed.  HENT:  Head: Normocephalic and atraumatic.  Eyes: EOM are normal. Pupils are equal, round, and reactive to light.  Neck: Normal range of motion. Neck supple. JVD present.  Cardiovascular: Normal rate and regular rhythm.   Pulmonary/Chest: Accessory muscle usage present. Tachypnea noted. She is in respiratory distress. She has decreased breath sounds. She has no wheezes. She has no rhonchi.  Abdominal: Soft. She exhibits no distension.  Musculoskeletal: Normal range of motion. She exhibits no edema.       Right lower leg: She exhibits edema.       Left lower leg: She exhibits edema.  Neurological: She is oriented to person, place, and time. She appears listless.  Skin: Skin is warm and dry.  Psychiatric: She has a normal mood and affect. Her behavior is normal.    ED Course  IO LINE INSERTION Date/Time: 11/10/2012 1:11 AM Performed by: Audelia Hives Authorized by: Preston Fleeting, DAVID Consent: The procedure was performed in an emergent situation. Indications: clinical deterioration, medication administration and rapid vascular access Local anesthesia used: no Patient sedated: no Insertion site: left proximal tibia Site preparation: alcohol Insertion device: 18 gauge IO needle Insertion: needle was inserted through the bony cortex Number of attempts: 1 Confirmation method: stability of the needle, easy infusion of fluids and aspiration of  blood/marrow Secured with: gauze dressing Patient tolerance: Patient tolerated the procedure well with no immediate complications. INTUBATION Date/Time: 11/10/2012 1:12 AM Performed by: Audelia Hives Authorized by: Preston Fleeting, DAVID Consent: The procedure was performed in an emergent situation. Indications:  respiratory failure and hypoxemia Intubation method: video-assisted Patient status: paralyzed (RSI) Preoxygenation: nonrebreather mask Sedatives: etomidate Paralytic: rocuronium Laryngoscope size: Mac 4 Tube size: 7.5 mm Tube type: cuffed Number of attempts: 1 Cords visualized: yes Post-procedure assessment: chest rise and ETCO2 monitor Breath sounds: equal Cuff inflated: yes ETT to lip: 23 cm Tube secured with: ETT holder Chest x-ray interpreted by me. Chest x-ray findings: endotracheal tube in appropriate position Patient tolerance: Patient tolerated the procedure well with no immediate complications.   (including critical care time) Results for orders placed during the hospital encounter of 11/09/12  PRO B NATRIURETIC PEPTIDE      Result Value Range   Pro B Natriuretic peptide (BNP) 1423.0 (*) 0 - 125 pg/mL  CBC WITH DIFFERENTIAL      Result Value Range   WBC 22.3 (*) 4.0 - 10.5 K/uL   RBC 3.95  3.87 - 5.11 MIL/uL   Hemoglobin 10.2 (*) 12.0 - 15.0 g/dL   HCT 16.1 (*) 09.6 - 04.5 %   MCV 80.3  78.0 - 100.0 fL   MCH 25.8 (*) 26.0 - 34.0 pg   MCHC 32.2  30.0 - 36.0 g/dL   RDW 40.9 (*) 81.1 - 91.4 %   Platelets 271  150 - 400 K/uL   Neutrophils Relative % 81 (*) 43 - 77 %   Neutro Abs 18.2 (*) 1.7 - 7.7 K/uL   Lymphocytes Relative 13  12 - 46 %   Lymphs Abs 2.8  0.7 - 4.0 K/uL   Monocytes Relative 6  3 - 12 %   Monocytes Absolute 1.2 (*) 0.1 - 1.0 K/uL   Eosinophils Relative 1  0 - 5 %   Eosinophils Absolute 0.1  0.0 - 0.7 K/uL   Basophils Relative 0  0 - 1 %   Basophils Absolute 0.0  0.0 - 0.1 K/uL  COMPREHENSIVE METABOLIC PANEL      Result Value Range   Sodium  137  135 - 145 mEq/L   Potassium 3.3 (*) 3.5 - 5.1 mEq/L   Chloride 105  96 - 112 mEq/L   CO2 18 (*) 19 - 32 mEq/L   Glucose, Bld 274 (*) 70 - 99 mg/dL   BUN 18  6 - 23 mg/dL   Creatinine, Ser 7.82 (*) 0.50 - 1.10 mg/dL   Calcium 95.6  8.4 - 21.3 mg/dL   Total Protein 7.5  6.0 - 8.3 g/dL   Albumin 3.3 (*) 3.5 - 5.2 g/dL   AST 26  0 - 37 U/L   ALT 15  0 - 35 U/L   Alkaline Phosphatase 195 (*) 39 - 117 U/L   Total Bilirubin 0.4  0.3 - 1.2 mg/dL   GFR calc non Af Amer 26 (*) >90 mL/min   GFR calc Af Amer 30 (*) >90 mL/min  LACTIC ACID, PLASMA      Result Value Range   Lactic Acid, Venous 2.3 (*) 0.5 - 2.2 mmol/L  URINALYSIS, ROUTINE W REFLEX MICROSCOPIC      Result Value Range   Color, Urine YELLOW  YELLOW   APPearance HAZY (*) CLEAR   Specific Gravity, Urine 1.010  1.005 - 1.030   pH 6.5  5.0 - 8.0   Glucose, UA 100 (*) NEGATIVE mg/dL   Hgb urine dipstick TRACE (*) NEGATIVE   Bilirubin Urine NEGATIVE  NEGATIVE   Ketones, ur NEGATIVE  NEGATIVE mg/dL   Protein, ur 086 (*) NEGATIVE mg/dL   Urobilinogen, UA 0.2  0.0 - 1.0  mg/dL   Nitrite POSITIVE (*) NEGATIVE   Leukocytes, UA NEGATIVE  NEGATIVE  URINE MICROSCOPIC-ADD ON      Result Value Range   Squamous Epithelial / LPF FEW (*) RARE   WBC, UA 3-6  <3 WBC/hpf   RBC / HPF 0-2  <3 RBC/hpf   Bacteria, UA FEW (*) RARE   Casts HYALINE CASTS (*) NEGATIVE  GLUCOSE, CAPILLARY      Result Value Range   Glucose-Capillary 123 (*) 70 - 99 mg/dL  POCT I-STAT 3, BLOOD GAS (G3+)      Result Value Range   pH, Arterial 7.109 (*) 7.350 - 7.450   pCO2 arterial 68.4 (*) 35.0 - 45.0 mmHg   pO2, Arterial 64.0 (*) 80.0 - 100.0 mmHg   Bicarbonate 21.7  20.0 - 24.0 mEq/L   TCO2 24  0 - 100 mmol/L   O2 Saturation 83.0     Acid-base deficit 8.0 (*) 0.0 - 2.0 mmol/L   Patient temperature 98.6 F     Collection site RADIAL, ALLEN'S TEST ACCEPTABLE     Drawn by Operator     Sample type ARTERIAL     Comment NOTIFIED PHYSICIAN    POCT I-STAT, CHEM  8      Result Value Range   Sodium 141  135 - 145 mEq/L   Potassium 3.4 (*) 3.5 - 5.1 mEq/L   Chloride 110  96 - 112 mEq/L   BUN 21  6 - 23 mg/dL   Creatinine, Ser 1.61 (*) 0.50 - 1.10 mg/dL   Glucose, Bld 096 (*) 70 - 99 mg/dL   Calcium, Ion 0.45 (*) 1.13 - 1.30 mmol/L   TCO2 20  0 - 100 mmol/L   Hemoglobin 11.6 (*) 12.0 - 15.0 g/dL   HCT 40.9 (*) 81.1 - 91.4 %  POCT I-STAT TROPONIN I      Result Value Range   Troponin i, poc 0.04  0.00 - 0.08 ng/mL   Comment 3           POCT I-STAT 3, BLOOD GAS (G3+)      Result Value Range   pH, Arterial 7.238 (*) 7.350 - 7.450   pCO2 arterial 46.7 (*) 35.0 - 45.0 mmHg   pO2, Arterial 69.0 (*) 80.0 - 100.0 mmHg   Bicarbonate 19.9 (*) 20.0 - 24.0 mEq/L   TCO2 21  0 - 100 mmol/L   O2 Saturation 90.0     Acid-base deficit 7.0 (*) 0.0 - 2.0 mmol/L   Patient temperature 98.6 F     Collection site RADIAL, ALLEN'S TEST ACCEPTABLE     Drawn by Operator     Sample type ARTERIAL    POCT I-STAT 3, BLOOD GAS (G3+)      Result Value Range   pH, Arterial 7.225 (*) 7.350 - 7.450   pCO2 arterial 49.8 (*) 35.0 - 45.0 mmHg   pO2, Arterial 133.0 (*) 80.0 - 100.0 mmHg   Bicarbonate 20.6  20.0 - 24.0 mEq/L   TCO2 22  0 - 100 mmol/L   O2 Saturation 98.0     Acid-base deficit 7.0 (*) 0.0 - 2.0 mmol/L   Patient temperature 98.4 F     Collection site RADIAL, ALLEN'S TEST ACCEPTABLE     Drawn by RT     Sample type ARTERIAL     DG Chest Portable 1 View (Final result)  Result time: 11/09/12 21:05:15    Final result by Rad Results In Interface (11/09/12 21:05:15)    Narrative:   *  RADIOLOGY REPORT*  Clinical Data: Bradycardia.  PORTABLE CHEST - 1 VIEW  Comparison: Chest 11/09/2012 and 1748 hours.  Findings: Support tubes and lines are unchanged. Pulmonary edema and pleural effusions seen on the prior study persist but have improved. There is cardiomegaly. No pneumothorax.  IMPRESSION: Improved pulmonary edema.   Original Report Authenticated By:  Holley Dexter, M.D.             DG Chest Portable 1 View (Final result)  Result time: 11/09/12 18:09:35    Final result by Rad Results In Interface (11/09/12 18:09:35)    Narrative:   *RADIOLOGY REPORT*  Clinical Data: Respiratory distress.  PORTABLE CHEST - 1 VIEW  Comparison: Plain film chest 10/31/2012.  Findings: The patient has a new endotracheal tube in place with the tip in good position 2.7 cm above the carina. There is extensive bilateral airspace disease and small effusions, larger on the right. Cardiomegaly is noted.  IMPRESSION:  1. ET tube in good position. 2. Extensive bilateral airspace disease and small effusions likely due to pulmonary edema.   Original Report Authenticated By: Holley Dexter, M.D.            Dg Chest Portable 1 View  11/09/2012   *RADIOLOGY REPORT*  Clinical Data: Respiratory distress.  PORTABLE CHEST - 1 VIEW  Comparison: Plain film chest 10/31/2012.  Findings: The patient has a new endotracheal tube in place with the tip in good position 2.7 cm above the carina.  There is extensive bilateral airspace disease and small effusions, larger on the right.  Cardiomegaly is noted.  IMPRESSION:  1.  ET tube in good position. 2.  Extensive bilateral airspace disease and small effusions likely due to pulmonary edema.   Original Report Authenticated By: Holley Dexter, M.D.   No diagnosis found.  MDM  Exam as above - severe resp distress, sats in low 80s on CPAP. Edema and jvd. Non verbal, following commands. Breath sounds globally diminished w/out wheeze. Concern for CHF exacerbation. Doubt COPD based on exam - no wheeze. ECG w/ LBBB - no significant change from previous - no STEMI criteria by sgarbossa. Doubt ACS.  IV access difficult to obtain. IO placed. Emergently intubated and placed on nitro gtt and given lasix 40mg . cxr c/w pulm edema. Blood gas c/w resp acidosis - rate increased to 20, PEEP at 10, u/a c/w UTI - placed on rocephin.  BNP 1423, troponin neg. lacti acid 2.3. D/w ICU and pt admitted for likely CHF exacerbation and UTI. Admit in critical condition.   I have personally reviewed labs and imaging and considered in my MDM. Case d/w Dr Phil Dopp, MD 11/10/12 [phone]

## 2012-11-09 NOTE — H&P (Signed)
PULMONARY  / CRITICAL CARE MEDICINE  Name: [name] MRN: [id number] DOB: Aug 08, 1940    ADMISSION DATE:  11/09/2012 CONSULTATION DATE:  11/09/2012  REFERRING MD :  Preston Fleeting PRIMARY SERVICE: PCCM  CHIEF COMPLAINT:  Shortness of breath  BRIEF PATIENT DESCRIPTION: 72 y/o female with CHF was admitted on 7/3 from the [facility] ED with acute hypoxemic respiratory failure due to a CHF exacerbation.  SIGNIFICANT EVENTS / STUDIES:  7/3 admission>>  LINES / TUBES: 7/3 ETT >>  CULTURES: 7/3 respiratory >>  ANTIBIOTICS: 7/3 ceftriaxone x1 ED  HISTORY OF PRESENT ILLNESS:  72 y/o female with CHF was admitted on 7/3 from the [facility] ED with acute hypoxemic respiratory failure due to a CHF exacerbation. The patient was intubated on my arrival and the family was not available when I called to provide history, so history is obtained from ER staff and chart review.  Apparently she suddenly became short of breath while shopping at [place] today.  EMS was called and she was intubated in the ED.    PAST MEDICAL HISTORY :  Past Medical History  Diagnosis Date  . CAD (coronary artery disease)     a. Nonobst by cath 11/11: LAD 40%, mid-dist 25-30%; prox CFX 30%, mid 40%; OM 50%; PDA 50%; PLV 50%;  EF 65%.  . NSTEMI (non-ST elevated myocardial infarction)     a. In setting of AFib with RVR 03/2010, type 2. b. Again in 11/2011 felt 2/2 afib.  . Atrial fibrillation     a. DCCV 11/17/11. b. On coumadin & amiodarone.  Marland Kitchen HOCM (hypertrophic obstructive cardiomyopathy)     a. Echo 11/2011:severe LVH, EF 65-70%, mid-cavity gradient up to . b. Echo 06/2012: EF 65-70%, severe LVH, grade 2 d/dysf.  Marland Kitchen Hypertension   . Tobacco abuse   . CKD (chronic kidney disease), stage IV   . Diastolic CHF     preserved LVF  . Iron deficiency anemia   . Third degree uterine prolapse   . Hx MRSA infection   . Hx of cardiovascular stress test     a. Lex MV 12/13:  EF 56%, no ischemia    . Diabetes   . Hyperlipidemia   .  Respiratory failure     Multiple admissions for resp failure requiring intubation.  . Hypercalcemia   . LBBB (left bundle branch block)     History of transient LBBB  . C. difficile colitis 01/2012   Past Surgical History  Procedure Laterality Date  . Tubal ligation    . Cardioversion  11/17/2011    Procedure: CARDIOVERSION;  Surgeon: Hillis Range, MD;  Location: [facility] OR;  Service: Cardiovascular;  Laterality: N/A;   Prior to Admission medications   Medication Sig Start Date End Date Taking? Authorizing Provider  acetaminophen (TYLENOL) 500 MG tablet Take 500 mg by mouth 2 (two) times daily as needed for pain.     Historical Provider, MD  amiodarone (PACERONE) 200 MG tablet Take 1 tablet (200 mg total) by mouth daily. 08/17/12   Rollene Rotunda, MD  amLODipine (NORVASC) 5 MG tablet Take 5 mg by mouth daily. 10/07/12   Historical Provider, MD  furosemide (LASIX) 20 MG tablet Take 2 tablets (40 mg total) by mouth daily. 09/04/12   Sorin Luanne Bras, MD  furosemide (LASIX) 40 MG tablet Take 1 tablet (40 mg total) by mouth daily. 11/01/12   Carleene Cooper III, MD  metoprolol tartrate (LOPRESSOR) 25 MG tablet Take 25 mg by mouth 2 (two) times daily. 10/14/12  Historical Provider, MD  nitroGLYCERIN (NITROSTAT) 0.4 MG SL tablet Place 0.4 mg under the tongue every 5 (five) minutes as needed for chest pain.     Historical Provider, MD  pantoprazole (PROTONIX) 40 MG tablet Take 1 tablet (40 mg total) by mouth daily at 12 noon. 08/16/12   Rollene Rotunda, MD  rosuvastatin (CRESTOR) 10 MG tablet Take 10 mg by mouth at bedtime.     Historical Provider, MD  warfarin (COUMADIN) 5 MG tablet Take 2.5-5 mg by mouth at bedtime. The patient takes 1/2 tablet (2.5 mg) daily EXCEPT for 1 tablet (5 mg) on Mondays only.    Historical Provider, MD   No Known Allergies  FAMILY HISTORY:  Family History  Problem Relation Age of Onset  . Coronary artery disease Neg Hx   . Atrial fibrillation Neg Hx   . Diabetes Daughter    SOCIAL  HISTORY:  reports that she quit smoking about 9 months ago. Her smoking use included Cigarettes. She has a 7.5 pack-year smoking history. She has never used smokeless tobacco. She reports that she does not drink alcohol or use illicit drugs.  REVIEW OF SYSTEMS:  Cannot obtain due to intubation  SUBJECTIVE:   VITAL SIGNS: Temp:  [98.1 F (36.7 C)-98.4 F (36.9 C)] 98.4 F (36.9 C) (07/03 1945) Pulse Rate:  [60-104] 70 (07/03 1945) Resp:  [14-41] 27 (07/03 1945) BP: (113-198)/(65-117) 133/79 mmHg (07/03 1945) SpO2:  [81 %-99 %] 91 % (07/03 1945) FiO2 (%):  [100 %] 100 % (07/03 1747) Weight:  [74.5 kg (164 lb 3.9 oz)] 74.5 kg (164 lb 3.9 oz) (07/03 1747) HEMODYNAMICS:   VENTILATOR SETTINGS: Vent Mode:  [-] PRVC FiO2 (%):  [100 %] 100 % Set Rate:  [14 bmp-20 bmp] 20 bmp Vt Set:  [440 mL] 440 mL PEEP:  [5 cmH20] 5 cmH20 Plateau Pressure:  [20 cmH20] 20 cmH20 INTAKE / OUTPUT: Intake/Output   None     PHYSICAL EXAMINATION:  Gen: sedated on vent, no acute distress HEENT: NCAT, PERRL, EOMi, ETT in place PULM: Crackles in bases, rhonchi in bases, no wheezing, prolonged exhalation CV: Tachy, distant heart sounds, JVD noted AB: BS+, soft, nontender, no hsm Ext: warm, trace edema, no clubbing, no cyanosis Derm: no rash or skin breakdown Neuro: Sedated on vent but arouses to voice, follows commands, maew   LABS:  Recent Labs Lab 11/09/12 1806 11/09/12 1827 11/09/12 1828 11/09/12 1829 11/09/12 1849  HGB  --   --  10.2*  --  11.6*  WBC  --   --  22.3*  --   --   PLT  --   --  271  --   --   NA  --   --  137  --  141  K  --   --  3.3*  --  3.4*  CL  --   --  105  --  110  CO2  --   --  18*  --   --   GLUCOSE  --   --  274*  --  278*  BUN  --   --  18  --  21  CREATININE  --   --  1.86*  --  1.80*  CALCIUM  --   --  10.1  --   --   AST  --   --  26  --   --   ALT  --   --  15  --   --   ALKPHOS  --   --  195*  --   --   BILITOT  --   --  0.4  --   --   PROT  --   --   7.5  --   --   ALBUMIN  --   --  3.3*  --   --   LATICACIDVEN  --   --   --  2.3*  --   PROBNP  --  1423.0*  --   --   --   PHART 7.109*  --   --   --   --   PCO2ART 68.4*  --   --   --   --   PO2ART 64.0*  --   --   --   --    No results found for this basename: GLUCAP,  in the last 168 hours  CXR: Bilateral airspace disease in bases consistent with CHF EKG: Sinus tach with left bundle morphology  ASSESSMENT / PLAN:  PULMONARY A: Acute hypercarbic and hypoxemic respiratory failure due to a CHF exacerbation COPD, but not clearly in exacerbation, unclear how severe her disease is at baseline P:   -full vent support -wean wean PEEP/FiO2 with diuresis -scheduled and prn albuterol -daily WAU/SBT/CXR/ABG  CARDIOVASCULAR A: Acute decompensated CHF, uncertain etiology of today's presentation HOCM Afib History of noncompliance P:  -cardiology consult -diuresis overnight -continue amlodipine -continue metoprolol 1/2 home dose -continue nitroglycerine gtt for now -serial lactic acid/troponin -continue amiodarone  RENAL A:  CKD, appears to be at baseline P:   -foley -monitor UOP with lasix -repeat BMET in AM  GASTROINTESTINAL A:  No acute issues P:   -OG tube -Pantoprazole for stress ulcer prophylaxis  HEMATOLOGIC A:  Warfarin for A-fib P:  -warfarin per pharmacy consult  INFECTIOUS A:  Leukocytosis but picture most consistent with CHF exacerbation, no clear infection P:   -respiratory culture -hold further antibiotics  ENDOCRINE A:  DM2 P:   -ICU hyperglycemia protocol  NEUROLOGIC A:  No acute issues P:   -propofol/fentanyl for sedation titrated to RASS -1  Code status: full, family not available for discussion  TODAY'S SUMMARY: Acute decompensated CHF causing vent dependent respiratory failure; plan diuresis, cardiology consult  I have personally obtained a history, examined the patient, evaluated laboratory and imaging results, formulated the  assessment and plan and placed orders. CRITICAL CARE: The patient is critically ill with multiple organ systems failure and requires high complexity decision making for assessment and support, frequent evaluation and titration of therapies, application of advanced monitoring technologies and extensive interpretation of multiple databases. Critical Care Time devoted to patient care services described in this note is 45 minutes.   Fonnie Jarvis Pulmonary and Critical Care Medicine [facility] Pager: [phone]  11/09/2012, 8:06 PM

## 2012-11-09 NOTE — ED Notes (Signed)
RT at bedside. Pt with decreased blood pressure and bradycardia. RN called to bedside. No radial pulse felt. Carotid pressure felt. Pt not responsive. Code button pushed. All medications stopped. Critical Care called and EPD to bedside.

## 2012-11-09 NOTE — ED Notes (Signed)
X-ray called to inform we need a chest xray, en route.

## 2012-11-09 NOTE — ED Notes (Signed)
Admitting MD at bedside. Pt is easily arousable. Pt answers questions by nodding head. Denies any difficulty breathing or pain. Pt aware of situation.

## 2012-11-09 NOTE — ED Provider Notes (Signed)
72 year-old female with history of congestive heart failure had sudden onset of dyspnea while at [facility] and was brought in by EMS on CPAP. She was only maintaining oxygen saturations in the 70s to low 80s on CPAP. She has a history of having been intubated. On exam, she was in respiratory distress and nonverbal though she would follow commands. Neck vein distention was present. Lungs had coarse wheezes most consistent with cardiac asthma. She did have 1+ peripheral edema. Despite CPAP, oxygen saturations remained in hypoxic range so that was felt that she needed intubation. Adequate IV access was difficult to obtain as intraosseous line was inserted. She was intubated at after RSI in oxygen saturations have come up. Chest x-ray has been ordered. She will need to be admitted to ICU. I was present for and directly supervised the resident for the procedures of intraosseous line insertion, RSI, intubation. Old records have been reviewed and she was hospitalized in April with virtually identical presentation. Chest x-ray shows pulmonary edema-image reviewed by me. She did have one episode while waiting to be admitted to ICU where she became bradycardic and hypotensive but this was transient and resolved without any interventions.   Date: 11/09/2012  Rate: 102  Rhythm: sinus tachycardia  QRS Axis: normal  Intervals: normal  ST/T Wave abnormalities: normal  Conduction Disutrbances:left bundle branch block  Narrative Interpretation: Sinus tachycardia, left bundle branch block. When compared with ECG of 10/31/2012, no significant changes are seen.  Old EKG Reviewed: unchanged  CRITICAL CARE Performed by: ZOXWR,UEAVW Total critical care time: 90 minutes Critical care time was exclusive of separately billable procedures and treating other patients. Critical care was necessary to treat or prevent imminent or life-threatening deterioration. Critical care was time spent personally by me on the following  activities: development of treatment plan with patient and/or surrogate as well as nursing, discussions with consultants, evaluation of patient's response to treatment, examination of patient, obtaining history from patient or surrogate, ordering and performing treatments and interventions, ordering and review of laboratory studies, ordering and review of radiographic studies, pulse oximetry and re-evaluation of patient's condition.   I saw and evaluated the patient, reviewed the resident's note and I agree with the findings and plan.   Dione Booze, MD 11/10/12 [id number]

## 2012-11-09 NOTE — ED Notes (Signed)
Pt with episode of vomiting. Pt suctioned.

## 2012-11-09 NOTE — ED Notes (Signed)
X-ray at bedside

## 2012-11-09 NOTE — ED Notes (Signed)
Family at the bedside.

## 2012-11-09 NOTE — ED Notes (Signed)
Radial pulse felt. Weak compared to initial assessment. Admitting aware. Pt is now opening eyes. Pt reoriented to situation. Crash cart remains at bedside.

## 2012-11-09 NOTE — ED Notes (Signed)
Resident at the bedside to intubate pt. Pt with severe respiratory distress on the bi-pap.

## 2012-11-09 NOTE — ED Notes (Signed)
Pt to department via EMS from [facility]- reports that she was shopping with family and had a sudden onset of respiratory distress. Pt reports hx of COPD and cardiac hx. Pt arrived on c-pap and 1 SL nitro in route. EMS reports that they could only get 02 sats in the 70's even with C-pap. Bp-180/114 HR-101 22g right hand.

## 2012-11-09 NOTE — Progress Notes (Signed)
Pt transfer to unit from ED. Pt had episode of N/V with 100 cc of emesis measured. Pt also noted to have some ST depression on admission to the unit. 12 lead EKG performed. MD paged regarding N/V, ST depression on monitor, and hypotension. MD ordered to hold lopressor and lasix due to hypotension. Pt is stable at this time with no complaints of pain or N/V.

## 2012-11-09 NOTE — Progress Notes (Signed)
Chaplain responded to a nurse page and reported to ED waiting area. Chaplain escort the family members of pt in trauma C to consult room A. Chaplain served as Print production planner between Copywriter, advertising and family. Chaplain provided emotional support to family until they departed the ED. Pt was later admitted to 2902. Family thanked chaplain for the support and his presence. Kelle Darting [phone]   11/09/12 1900  Clinical Encounter Type  Visited With Family  Visit Type Initial;Spiritual support;Critical Care;ED;Trauma  Referral From Nurse  Spiritual Encounters  Spiritual Needs Emotional  Stress Factors  Patient Stress Factors Not reviewed  Family Stress Factors Major life changes  chaplain for his presence and support. Kelle Darting [phone]

## 2012-11-09 NOTE — Consult Note (Signed)
CARDIOLOGY CONSULT NOTE  Patient ID: [name], MRN: [id number], DOB/AGE: 1940/12/21 72 y.o. Admit date: 11/09/2012   Date of Consult: 11/09/2012 Primary Physician: Dorrene German, MD Primary Cardiologist: Hochrein  Chief Complaint: shortness of breath Reason for Consult: CHF, HOCM  HPI: [name] is a 72 y/o F with history of diastolic CHF and HOCM, atrial fibrillation on amiodarone/Coumadin, CKD stage IV, nonobstructive CAD 2011, previous transient LBBB, and several prior admissions for VDRF for pulmonary edema who presented to [facility] with acute pulmonary edema and acute respiratory distress requiring intubation. She had a recent admission 06/16/2012 and 10/2012 for the same.  She is currently intubated and no family is present at this time to assist with further history.  Patient presented to ED in severe respiratory distress with O2 sats in low 80s.  She did not have much improvement on CPAP and was subsequently intubated.  She was placed on Nitro gtt and given IV lasix.  She has been admitted to critical care and we have been asked to assist with management given her complex cardiac history and current presentation.  Past Medical History  Diagnosis Date  . CAD (coronary artery disease)     a. Nonobst by cath 11/11: LAD 40%, mid-dist 25-30%; prox CFX 30%, mid 40%; OM 50%; PDA 50%; PLV 50%;  EF 65%.  . NSTEMI (non-ST elevated myocardial infarction)     a. In setting of AFib with RVR 03/2010, type 2. b. Again in 11/2011 felt 2/2 afib.  . Atrial fibrillation     a. DCCV 11/17/11. b. On coumadin & amiodarone.  Marland Kitchen HOCM (hypertrophic obstructive cardiomyopathy)     a. Echo 11/2011:severe LVH, EF 65-70%, mid-cavity gradient up to . b. Echo 06/2012: EF 65-70%, severe LVH, grade 2 d/dysf.  Marland Kitchen Hypertension   . Tobacco abuse   . CKD (chronic kidney disease), stage IV   . Diastolic CHF     preserved LVF  . Iron deficiency anemia   . Third degree uterine prolapse   . Hx MRSA  infection   . Hx of cardiovascular stress test     a. Lex MV 12/13:  EF 56%, no ischemia    . Diabetes   . Hyperlipidemia   . Respiratory failure     Multiple admissions for resp failure requiring intubation.  . Hypercalcemia   . LBBB (left bundle branch block)     History of transient LBBB  . C. difficile colitis 01/2012      Most Recent Cardiac Studies: 2D Echo 4/14 Study Conclusions Left ventricle: The cavity size was normal. There was severe asymmetric hypertrophy of the septum, consistent with hypertrophic cardiomyopathy. Systolic function was vigorous. The estimated ejection fraction was in the range of 65% to 70%. There was dynamic obstruction at restin the mid cavity, with a peak velocity of 560cm/sec. Wall motion was normal; there were no regional wall motion abnormalities. Features are consistent with a pseudonormal left ventricular filling pattern, with concomitant abnormal relaxation and increased filling pressure (grade 2 diastolic dysfunction). Doppler parameters are consistent with high ventricular filling pressure. - Mitral valve: Calcified annulus. - Left atrium: The atrium was moderately dilated.   Cardiac Cath 03/2010 FINDINGS: 1. Hemodynamics:  LV 111/21, aorta 114/77. 2. Left ventriculography:  EF was estimated at 65% and was vigorous. 3. Right coronary artery:  The right coronary artery was a dominant  vessel.  There was a long 50% stenosis in the PDA and a long 50% stenosis in the  PLV. 4. Left main:  The left main was short with no significant disease. 5. Left circumflex system:  There was a moderate-sized ramus with a 30% proximal stenosis and 40% mid stenosis.  The circumflex itself gave off a large first obtuse marginal that was diffusely diseased including about 50% midvessel stenosis.  The continuation of the AV circumflex beyond the first obtuse marginal was diffusely severely diseased. Compared to the prior study in August, it did appear worse.   However, in August there was still diffuse disease present in the continuation of the AV circumflex beyond the first obtuse marginal. 6. LAD system:  There was a moderate-sized first diagonal with minimal disease.  There was a 40% LAD stenosis after the first diagonal. The remainder of the LAD had diffuse 25-30% stenoses throughout the mid-to-distal LAD. IMPRESSION:  The patient has diffuse mild-to-moderate coronary disease. There is no severe discrete stenosis and no culprit lesion.  The continuation of the AV circumflex beyond the first obtuse marginal had severe diffuse disease.  However, this was present to a lesser degree on the catheterization in August 2011.  I do not believe this is the source of her symptoms today.  Ejection fraction is preserved.  Heart rate was in the 40s during the procedure with the appearance of heart block.  We placed a temporary transvenous pacemaker.  Once the temporary transvenous pacemaker was placed, we were able to titrate the patient off dopamine.  We set the rate at 70 with good capture with a threshold of about 0.9 mA.  We will plan on restarting the heparin drip given her atrial fibrillation 6 hours post-sheath pull.   Surgical History:  Past Surgical History  Procedure Laterality Date  . Tubal ligation    . Cardioversion  11/17/2011    Procedure: CARDIOVERSION;  Surgeon: Hillis Range, MD;  Location: [facility] OR;  Service: Cardiovascular;  Laterality: N/A;     Home Meds: Prior to Admission medications   Medication Sig Start Date End Date Taking? Authorizing Provider  acetaminophen (TYLENOL) 500 MG tablet Take 500 mg by mouth 2 (two) times daily as needed for pain.    Yes Historical Provider, MD  amiodarone (PACERONE) 200 MG tablet Take 1 tablet (200 mg total) by mouth daily. 08/17/12  Yes Rollene Rotunda, MD  amLODipine (NORVASC) 5 MG tablet Take 1 tablet (5 mg total) by mouth daily. 08/31/12 08/31/13 Yes Rollene Rotunda, MD  furosemide (LASIX) 20 MG tablet Take 1  tablet (20 mg total) by mouth daily. 06/05/12  Yes Kathlen Mody, MD  nitroGLYCERIN (NITROSTAT) 0.4 MG SL tablet Place 0.4 mg under the tongue every 5 (five) minutes as needed for chest pain.    Yes Historical Provider, MD  pantoprazole (PROTONIX) 40 MG tablet Take 1 tablet (40 mg total) by mouth daily at 12 noon. 08/16/12  Yes Rollene Rotunda, MD  predniSONE (STERAPRED UNI-PAK) 10 MG tablet 10-40 mg daily. Take 40 mg daily for 3 days, then 30 mg daily for 3 days, then 20 mg daily for 3 days, then 10 mg daily for 3 days, then stop; Start date & duration unknown 06/22/12  Yes Laveda Norman, MD  rosuvastatin (CRESTOR) 10 MG tablet Take 10 mg by mouth at bedtime.    Yes Historical Provider, MD  warfarin (COUMADIN) 5 MG tablet Take 2.5-5 mg by mouth daily. The patient takes 1/2 tablet (2.5 mg) daily EXCEPT for 1 tablet (5 mg) on Mondays only.   Yes Historical Provider, MD    Inpatient  Medications:  . furosemide  40 mg Intravenous Q6H  . insulin aspart  1-3 Units Subcutaneous Q4H  . lidocaine (cardiac) 100 mg/26ml      . nitroGLYCERIN  2-200 mcg/min Intravenous Once  . succinylcholine        Allergies: No Known Allergies  History   Social History  . Marital Status: Single    Spouse Name: N/A    Number of Children: N/A  . Years of Education: N/A   Occupational History  . Not on file.   Social History Main Topics  . Smoking status: Former Smoker -- 0.25 packs/day for 30 years    Types: Cigarettes    Quit date: 02/02/2012  . Smokeless tobacco: Never Used     Comment: quit 01/2012  . Alcohol Use: No  . Drug Use: No  . Sexually Active: No   Other Topics Concern  . Not on file   Social History Narrative   She lives with her dtr in [place].  She quit smoking 2 mos ago.     Family History  Problem Relation Age of Onset  . Coronary artery disease Neg Hx   . Atrial fibrillation Neg Hx   . Diabetes Daughter      Review of Systems: BP 101/59  Pulse 54  Temp(Src) 98.6 F (37 C)  Resp 20   Ht 5\' 4"  (1.626 m)  Wt 74.5 kg (164 lb 3.9 oz)  BMI 28.18 kg/m2  SpO2 96%  Objective: General appearance: remains intubated in ED Head: Normocephalic, without obvious abnormality, atraumatic Lungs: clear to auscultation anteriorly, no wheezing appreciated Heart: regular rate and rhythm, S1, S2 normal, Abdomen: soft, bowel sounds normal; no masses,  no organomegaly Extremities: 1+ BLE edema Pulses: 2+ and symmetric  Labs: No results found for this basename: CKTOTAL, CKMB, TROPONINI,  in the last 72 hours Lab Results  Component Value Date   WBC 22.3* 11/09/2012   HGB 11.6* 11/09/2012   HCT 34.0* 11/09/2012   MCV 80.3 11/09/2012   PLT 271 11/09/2012     Recent Labs Lab 11/09/12 1828 11/09/12 1849  NA 137 141  K 3.3* 3.4*  CL 105 110  CO2 18*  --   BUN 18 21  CREATININE 1.86* 1.80*  CALCIUM 10.1  --   PROT 7.5  --   BILITOT 0.4  --   ALKPHOS 195*  --   ALT 15  --   AST 26  --   GLUCOSE 274* 278*   Lab Results  Component Value Date   CHOL 152 10/12/2010   HDL 60.90 10/12/2010   LDLCALC 74 10/12/2010   TRIG 85.0 10/12/2010   ABG 7.109/68.4/64/90 BNP 1423 Trp 0.04 Lactic acid 2.3  Dg Chest Portable 1 View  11/09/2012   *RADIOLOGY REPORT*  Clinical Data: Bradycardia.  PORTABLE CHEST - 1 VIEW  Comparison: Chest 11/09/2012 and 1748 hours.  Findings: Support tubes and lines are unchanged.  Pulmonary edema and pleural effusions seen on the prior study persist but have improved.  There is cardiomegaly.  No pneumothorax.  IMPRESSION: Improved pulmonary edema.   Original Report Authenticated By: Holley Dexter, M.D.   Dg Chest Portable 1 View  11/09/2012   *RADIOLOGY REPORT*  Clinical Data: Respiratory distress.  PORTABLE CHEST - 1 VIEW  Comparison: Plain film chest 10/31/2012.  Findings: The patient has a new endotracheal tube in place with the tip in good position 2.7 cm above the carina.  There is extensive bilateral airspace disease and small effusions, larger  on the right.   Cardiomegaly is noted.  IMPRESSION:  1.  ET tube in good position. 2.  Extensive bilateral airspace disease and small effusions likely due to pulmonary edema.   Original Report Authenticated By: Holley Dexter, M.D.    ECG:  Initial Wide complex tachycardia, appears to be sinus tach with non specific intraventricular conduction delay, NS ST-T changes Repeat sinus rhythm HR 50 LVH with repolarization abnormality, similar to prior ecgs reviewed.  Assessment and Plan:   1. Recurrent acute hypoxic respiratory failure requiring ventilation -Continue gentle diuresis with IV Lasix 40mg  BID, monitor strict I/Os, daily weights. -Would d/c IV Nitro to prevent hypotension -Mechanical Vent per CCM, repeat ABG pending  2. Acute pulmonary edema/acute on chronic diastolic CHF/hypertrophic obstructive cardiomyopathy As above  3. History of atrial fibrillation -Continue amiodarone -Check PT/INR -Continue anticoagulation   4. CKD stage IV with Hypokalemia -Monitor Cr with diuresis -Replace electrolytes  5. Nonobstructive CAD by cath 2011, nonischemic MV 04/2012

## 2012-11-10 ENCOUNTER — Inpatient Hospital Stay (HOSPITAL_COMMUNITY): Payer: Medicare Other

## 2012-11-10 DIAGNOSIS — I10 Essential (primary) hypertension: Secondary | ICD-10-CM

## 2012-11-10 DIAGNOSIS — N184 Chronic kidney disease, stage 4 (severe): Secondary | ICD-10-CM

## 2012-11-10 DIAGNOSIS — I428 Other cardiomyopathies: Secondary | ICD-10-CM

## 2012-11-10 DIAGNOSIS — E119 Type 2 diabetes mellitus without complications: Secondary | ICD-10-CM

## 2012-11-10 LAB — POCT I-STAT 3, ART BLOOD GAS (G3+)
TCO2: 23 mmol/L (ref 0–100)
pCO2 arterial: 49.2 mmHg — ABNORMAL HIGH (ref 35.0–45.0)
pH, Arterial: 7.252 — ABNORMAL LOW (ref 7.350–7.450)

## 2012-11-10 LAB — MAGNESIUM: Magnesium: 1.9 mg/dL (ref 1.5–2.5)

## 2012-11-10 LAB — PROTIME-INR
INR: 1.42 (ref 0.00–1.49)
INR: 1.55 — ABNORMAL HIGH (ref 0.00–1.49)
Prothrombin Time: 17.2 seconds — ABNORMAL HIGH (ref 11.6–15.2)

## 2012-11-10 LAB — STREP PNEUMONIAE URINARY ANTIGEN: Strep Pneumo Urinary Antigen: NEGATIVE

## 2012-11-10 LAB — PROCALCITONIN: Procalcitonin: 13.47 ng/mL

## 2012-11-10 LAB — BASIC METABOLIC PANEL
CO2: 22 mEq/L (ref 19–32)
Chloride: 107 mEq/L (ref 96–112)
Creatinine, Ser: 2.33 mg/dL — ABNORMAL HIGH (ref 0.50–1.10)
Potassium: 3.9 mEq/L (ref 3.5–5.1)
Sodium: 139 mEq/L (ref 135–145)

## 2012-11-10 LAB — GLUCOSE, CAPILLARY
Glucose-Capillary: 106 mg/dL — ABNORMAL HIGH (ref 70–99)
Glucose-Capillary: 93 mg/dL (ref 70–99)

## 2012-11-10 LAB — CBC
MCV: 80.1 fL (ref 78.0–100.0)
Platelets: 235 10*3/uL (ref 150–400)
RBC: 3.17 MIL/uL — ABNORMAL LOW (ref 3.87–5.11)
WBC: 12.2 10*3/uL — ABNORMAL HIGH (ref 4.0–10.5)

## 2012-11-10 LAB — PHOSPHORUS: Phosphorus: 4.5 mg/dL (ref 2.3–4.6)

## 2012-11-10 LAB — SODIUM, URINE, RANDOM: Sodium, Ur: 50 mEq/L

## 2012-11-10 LAB — TROPONIN I: Troponin I: <0.3 ng/mL (ref <0.30)

## 2012-11-10 MED ORDER — PRO-STAT SUGAR FREE PO LIQD
30.0000 mL | Freq: Every day | ORAL | Status: DC
  Administered 2012-11-10 – 2012-11-20 (×45): 30 mL
  Filled 2012-11-10 (×54): qty 30

## 2012-11-10 MED ORDER — MIDAZOLAM HCL 2 MG/2ML IJ SOLN
INTRAMUSCULAR | Status: AC
  Administered 2012-11-10: 2 mg
  Filled 2012-11-10: qty 2

## 2012-11-10 MED ORDER — OSMOLITE 1.5 CAL PO LIQD
1000.0000 mL | ORAL | Status: DC
  Administered 2012-11-10 – 2012-11-20 (×10): 1000 mL
  Filled 2012-11-10 (×13): qty 1000

## 2012-11-10 MED ORDER — VANCOMYCIN HCL IN DEXTROSE 750-5 MG/150ML-% IV SOLN
750.0000 mg | INTRAVENOUS | Status: DC
  Administered 2012-11-11 (×2): 750 mg via INTRAVENOUS
  Filled 2012-11-10 (×2): qty 150

## 2012-11-10 MED ORDER — LEVOFLOXACIN IN D5W 750 MG/150ML IV SOLN
750.0000 mg | INTRAVENOUS | Status: DC
  Administered 2012-11-12: 750 mg via INTRAVENOUS
  Filled 2012-11-10: qty 150

## 2012-11-10 MED ORDER — ADULT MULTIVITAMIN LIQUID CH
5.0000 mL | Freq: Every day | ORAL | Status: DC
  Administered 2012-11-10 – 2012-11-21 (×12): 5 mL
  Filled 2012-11-10 (×13): qty 5

## 2012-11-10 MED ORDER — VANCOMYCIN HCL IN DEXTROSE 750-5 MG/150ML-% IV SOLN
750.0000 mg | INTRAVENOUS | Status: DC
  Filled 2012-11-10: qty 150

## 2012-11-10 MED ORDER — WARFARIN - PHARMACIST DOSING INPATIENT
Freq: Every day | Status: DC
  Administered 2012-11-10 – 2012-11-14 (×3)

## 2012-11-10 MED ORDER — BIOTENE DRY MOUTH MT LIQD
15.0000 mL | Freq: Two times a day (BID) | OROMUCOSAL | Status: DC
  Administered 2012-11-10 – 2012-11-23 (×27): 15 mL via OROMUCOSAL

## 2012-11-10 MED ORDER — MIDAZOLAM HCL 2 MG/ML PO SYRP: 1.0000 mg | ORAL_SOLUTION | ORAL | Status: DC | PRN

## 2012-11-10 MED ORDER — WARFARIN SODIUM 5 MG PO TABS
5.0000 mg | ORAL_TABLET | Freq: Once | ORAL | Status: AC
  Administered 2012-11-10: 5 mg via ORAL
  Filled 2012-11-10: qty 1

## 2012-11-10 MED ORDER — VANCOMYCIN HCL 10 G IV SOLR
1250.0000 mg | Freq: Once | INTRAVENOUS | Status: AC
  Administered 2012-11-10: 1250 mg via INTRAVENOUS
  Filled 2012-11-10: qty 1250

## 2012-11-10 MED ORDER — DEXTROSE 5 % IV SOLN
1.0000 g | INTRAVENOUS | Status: DC
  Administered 2012-11-10 – 2012-11-11 (×2): 1 g via INTRAVENOUS
  Filled 2012-11-10 (×3): qty 10

## 2012-11-10 MED ORDER — WARFARIN SODIUM 6 MG PO TABS
6.0000 mg | ORAL_TABLET | ORAL | Status: AC
  Administered 2012-11-10: 6 mg
  Filled 2012-11-10: qty 1

## 2012-11-10 MED ORDER — CHLORHEXIDINE GLUCONATE 0.12 % MT SOLN
15.0000 mL | Freq: Two times a day (BID) | OROMUCOSAL | Status: DC
  Administered 2012-11-10 – 2012-11-23 (×28): 15 mL via OROMUCOSAL
  Filled 2012-11-10 (×29): qty 15

## 2012-11-10 MED ORDER — LEVOFLOXACIN IN D5W 750 MG/150ML IV SOLN
750.0000 mg | Freq: Once | INTRAVENOUS | Status: AC
  Administered 2012-11-10: 750 mg via INTRAVENOUS
  Filled 2012-11-10: qty 150

## 2012-11-10 NOTE — Progress Notes (Addendum)
PULMONARY  / CRITICAL CARE MEDICINE  Name: [name] MRN: [id number] DOB: 1940-10-29    ADMISSION DATE:  11/09/2012 CONSULTATION DATE:  11/09/2012  REFERRING MD :  Preston Fleeting PRIMARY SERVICE: PCCM  CHIEF COMPLAINT:  Shortness of breath  BRIEF PATIENT DESCRIPTION: 72 y/o female with CHF was admitted on 7/3 from the [facility] ED with acute hypoxemic respiratory failure due to a CHF exacerbation, ? Superimposed PNA  SIGNIFICANT EVENTS / STUDIES:  7/3 admission  LINES / TUBES: 7/3 ETT >> 7/4 CVC R IJ >>   CULTURES: 7/3 respiratory >> 7/4 U strep>>> 7/4 legionella>>>  ANTIBIOTICS: 7/3 Ceftriaxone>>> 7/4 Azithromycin>>> 7/4 Vancomycin>>>  SUBJECTIVE:  Intubated and sedated  VITAL SIGNS: Temp:  [98.1 F (36.7 C)-99.3 F (37.4 C)] 99.3 F (37.4 C) (07/04 0400) Pulse Rate:  [47-104] 73 (07/04 0729) Resp:  [11-41] 15 (07/04 0729) BP: (53-198)/(29-117) 122/52 mmHg (07/04 0729) SpO2:  [81 %-100 %] 100 % (07/04 0729) FiO2 (%):  [70 %-100 %] 70 % (07/04 0734) Weight:  [74.5 kg (164 lb 3.9 oz)-78.6 kg (173 lb 4.5 oz)] 78.6 kg (173 lb 4.5 oz) (07/03 2215) HEMODYNAMICS:   VENTILATOR SETTINGS: Vent Mode:  [-] PRVC FiO2 (%):  [70 %-100 %] 70 % Set Rate:  [14 bmp-20 bmp] 20 bmp Vt Set:  [440 mL] 440 mL PEEP:  [5 cmH20-10 cmH20] 10 cmH20 Plateau Pressure:  [17 cmH20-26 cmH20] 23 cmH20 INTAKE / OUTPUT: Intake/Output     07/03 0701 - 07/04 0700 07/04 0701 - 07/05 0700   I.V. (mL/kg) 95.8 (1.2)    Total Intake(mL/kg) 95.8 (1.2)    Urine (mL/kg/hr) 650    Emesis/NG output 150    Total Output 800     Net -704.3            PHYSICAL EXAMINATION:  Gen: sedated on vent, no acute distress HEENT: NCAT, PERRL, EOMi, ETT in place PULM: Crackles in bases, rhonchi in bases, no wheezing, prolonged exhalation CV: Tachy, distant heart sounds, JVD noted AB: BS+, soft, nontender, no hsm Ext: warm, trace edema, no clubbing, no cyanosis Derm: no rash or skin breakdown Neuro: Sedated on vent but  arouses to voice, follows commands, maew   LABS:  Recent Labs Lab 11/09/12 1828 11/09/12 1849  NA 137 141  K 3.3* 3.4*  CL 105 110  CO2 18*  --   BUN 18 21  CREATININE 1.86* 1.80*  GLUCOSE 274* 278*    Recent Labs Lab 11/09/12 1828 11/09/12 1849  HGB 10.2* 11.6*  HCT 31.7* 34.0*  WBC 22.3*  --   PLT 271  --     CXR: Bilateral opacities, questionable infiltrate in RUL EKG: Sinus tach with left bundle morphology  ASSESSMENT / PLAN:  PULMONARY A: Acute hypercarbic and hypoxemic respiratory failure in setting of bilateral pulmonary infiltrates. Suspect CHF and decomp edema, but CAP w/ ALI on diff dx.  COPD, but not clearly in exacerbation, unclear how severe her disease is at baseline P:   -full vent support -wean wean PEEP/FiO2 with diuresis -scheduled and prn albuterol -daily WAU/SBT/CXR/ABG -see ID section  CARDIOVASCULAR A:  Acute decompensated CHF, uncertain etiology of today's presentation HOCM Afib History of noncompliance P:  -continue amlodipine -continue metoprolol -continue amiodarone -lasix today 7/4 with a goal of net negative at least 500 mL -Follow CVP trend  RENAL A:  CKD, appears to be at baseline P:   -foley -monitor UOP with lasix -repeat BMET in AM -replete electrolytes as indicated  GASTROINTESTINAL A:  No acute issues P:   - Protonix for SUP - Will begin enteral feeding while on mechanical ventilation  HEMATOLOGIC A:  Warfarin for A-fib P:  -warfarin per pharmacy consult  INFECTIOUS A:  Leukocytosis new infiltrate in RUL with elevated procalcitonin P:   - Rocephin and levaquin for CAP - Vancomycin due to MRSA history - Continue procalcitonin trend  ENDOCRINE A:  DM2 P:   -ICU hyperglycemia protocol  NEUROLOGIC A:  No acute issues P:   PRN sedation    TODAY'S SUMMARY: Mrs. Hosmer presented with likely CHF exacerbation. She meets SIRS criteria with a potential infiltrate in her RUL. Will initiate treatment  for CAP. Will place central line and trend CVP.   45 minutes CC time   Levy Pupa, MD, PhD 11/10/2012, 9:51 AM [facility] [phone] or if no answer [phone]

## 2012-11-10 NOTE — Procedures (Signed)
Central Venous Catheter Insertion Procedure Note LUTIE PICKLER [id number] [birth year]  Procedure: Insertion of Central Venous Catheter Indications: Assessment of intravascular volume, Drug and/or fluid administration and Frequent blood sampling  Procedure Details Consent: Unable to obtain consent because of altered level of consciousness. Time Out: Verified patient identification, verified procedure, site/side was marked, verified correct patient position, special equipment/implants available, medications/allergies/relevent history reviewed, required imaging and test results available.  Performed  Maximum sterile technique was used including antiseptics, cap, gloves, gown, hand hygiene, mask and sheet. Skin prep: Chlorhexidine; local anesthetic administered Real time ultrasound was used to identify the vessel and guide venipuncture. A antimicrobial bonded/coated triple lumen catheter was placed in the right internal jugular vein using the Seldinger technique.  Evaluation Blood flow good Complications: No apparent complications Patient did tolerate procedure well. Chest X-ray ordered to verify placement.  CXR: normal.  Brenton Grills Hocutt S-ACNP 11/10/2012, 8:55 AM  Levy Pupa, MD, PhD 11/10/2012, 9:52 AM [facility] [phone] or if no answer [phone]

## 2012-11-10 NOTE — Progress Notes (Signed)
INITIAL NUTRITION ASSESSMENT  DOCUMENTATION CODES Per approved criteria  -Obesity Unspecified   INTERVENTION:  Initiate Osmolite 1.5 formula at goal rate of 20 ml/hr with Prostat liquid protein 30 ml 5 times daily to provide 1220 kcals (24 kcals/kg ideal body weight), 105 gm protein (100% of estimated protein needs), 366 ml of water  Liquid MVI daily via tube RD to follow for nutrition care plan  NUTRITION DIAGNOSIS: Inadequate oral intake related to inability to eat as evidenced by NPO status  Goal: Enteral nutrition to provide 60-70% of estimated calorie needs (22-25 kcals/kg ideal body weight) and 100% of estimated protein needs, based on ASPEN guidelines for permissive underfeeding in critically ill obese individuals  Monitor:  EN regimen & tolerance, respiratory status, weight, labs, I/O's  Reason for Assessment: Consult  72 y.o. female  Admitting Dx: Acute on chronic diastolic congestive heart failure  ASSESSMENT: Patient with hx of CHF admitted from [facility] ED with acute hypoxemic respiratory failure due to CHF exacerbation.  Patient is currently intubated on ventilator support ---> OGT in place MV: 9.6 Temp: 37.9  RD consulted for TF initiation & management.  Height: Ht Readings from Last 1 Encounters:  11/09/12 5\' 2"  (1.575 m)    Weight: Wt Readings from Last 1 Encounters:  11/09/12 173 lb 4.5 oz (78.6 kg)    Ideal Body Weight: 110 lb  % Ideal Body Weight: 157%  Wt Readings from Last 10 Encounters:  11/09/12 173 lb 4.5 oz (78.6 kg)  09/04/12 164 lb 3.9 oz (74.5 kg)  08/31/12 178 lb 1.9 oz (80.795 kg)  06/28/12 180 lb (81.647 kg)  06/22/12 177 lb 4 oz (80.4 kg)  06/05/12 176 lb 14.4 oz (80.241 kg)  04/27/12 184 lb 1.9 oz (83.516 kg)  04/19/12 180 lb (81.647 kg)  04/09/12 177 lb 0.5 oz (80.3 kg)  03/23/12 187 lb (84.823 kg)    Usual Body Weight: 180 lb  % Usual Body Weight: 96%  BMI:  Body mass index is 31.69 kg/(m^2).  Estimated Nutritional  Needs: Kcal: 1600 Protein: 100-110 gm Fluid: per MD  Skin: Intact  Diet Order: NPO  EDUCATION NEEDS: -No education needs identified at this time   Intake/Output Summary (Last 24 hours) at 11/10/12 1355 Last data filed at 11/10/12 1300  Gross per 24 hour  Intake 672.42 ml  Output   1030 ml  Net -357.58 ml    Labs:   Recent Labs Lab 11/09/12 1828 11/09/12 1849 11/10/12 0700  NA 137 141 139  K 3.3* 3.4* 3.9  CL 105 110 107  CO2 18*  --  22  BUN 18 21 22   CREATININE 1.86* 1.80* 2.33*  CALCIUM 10.1  --  10.4  MG  --   --  1.9  PHOS  --   --  4.5  GLUCOSE 274* 278* 110*    CBG (last 3)   Recent Labs  11/10/12 0414 11/10/12 0744 11/10/12 1159  GLUCAP 98 106* 93    Scheduled Meds: . albuterol  6 puff Inhalation Q4H  . amiodarone  200 mg Oral Daily  . amLODipine  5 mg Oral Daily  . antiseptic oral rinse  15 mL Mouth Rinse BID  . atorvastatin  20 mg Oral q1800  . cefTRIAXone (ROCEPHIN)  IV  1 g Intravenous Q24H  . chlorhexidine  15 mL Mouth/Throat BID  . insulin aspart  1-3 Units Subcutaneous Q4H  . [START ON 11/12/2012] levofloxacin (LEVAQUIN) IV  750 mg Intravenous Q48H  . metoprolol tartrate  12.5 mg Oral BID  . nitroGLYCERIN  2-200 mcg/min Intravenous Once  . pantoprazole (PROTONIX) IV  40 mg Intravenous QHS  . sodium chloride  3 mL Intravenous Q12H  . [START ON 11/11/2012] vancomycin  750 mg Intravenous Q24H  . Warfarin - Pharmacist Dosing Inpatient   Does not apply q1800    Continuous Infusions: . fentaNYL infusion INTRAVENOUS 100 mcg/hr (11/10/12 1056)  . propofol Stopped (11/09/12 2249)    Past Medical History  Diagnosis Date  . CAD (coronary artery disease)     a. Nonobst by cath 11/11: LAD 40%, mid-dist 25-30%; prox CFX 30%, mid 40%; OM 50%; PDA 50%; PLV 50%;  EF 65%.  . NSTEMI (non-ST elevated myocardial infarction)     a. In setting of AFib with RVR 03/2010, type 2. b. Again in 11/2011 felt 2/2 afib.  . Atrial fibrillation     a. DCCV  11/17/11. b. On coumadin & amiodarone.  Marland Kitchen HOCM (hypertrophic obstructive cardiomyopathy)     a. Echo 11/2011:severe LVH, EF 65-70%, mid-cavity gradient up to . b. Echo 06/2012: EF 65-70%, severe LVH, grade 2 d/dysf.  Marland Kitchen Hypertension   . Tobacco abuse   . CKD (chronic kidney disease), stage IV   . Diastolic CHF     preserved LVF  . Iron deficiency anemia   . Third degree uterine prolapse   . Hx MRSA infection   . Hx of cardiovascular stress test     a. Lex MV 12/13:  EF 56%, no ischemia    . Diabetes   . Hyperlipidemia   . Respiratory failure     Multiple admissions for resp failure requiring intubation.  . Hypercalcemia   . LBBB (left bundle branch block)     History of transient LBBB  . C. difficile colitis 01/2012    Past Surgical History  Procedure Laterality Date  . Tubal ligation    . Cardioversion  11/17/2011    Procedure: CARDIOVERSION;  Surgeon: Hillis Range, MD;  Location: [facility] OR;  Service: Cardiovascular;  Laterality: N/A;    Maureen Chatters, RD, LDN Pager #: [phone] After-Hours Pager #: [phone]

## 2012-11-10 NOTE — Progress Notes (Addendum)
ANTIBIOTIC CONSULT NOTE - INITIAL & ANTICOAGULATION- FOLLOW-UP  Pharmacy Consult for Vancomycin + Levaquin & Warfarin Indication: rule out pneumonia & Hx Afib  No Known Allergies  Patient Measurements: Height: 5\' 2"  (157.5 cm) Weight: 173 lb 4.5 oz (78.6 kg) IBW/kg (Calculated) : 50.1  Vital Signs: Temp: 100.2 F (37.9 C) (07/04 0745) Temp src: Core (Comment) (07/04 0745) BP: 110/59 mmHg (07/04 0800) Pulse Rate: 61 (07/04 0800) Intake/Output from previous day: 07/03 0701 - 07/04 0700 In: 95.8 [I.V.:95.8] Out: 800 [Urine:650; Emesis/NG output:150] Intake/Output from this shift: Total I/O In: 20 [I.V.:20] Out: -   Labs:  Recent Labs  11/09/12 1828 11/09/12 1849  WBC 22.3*  --   HGB 10.2* 11.6*  PLT 271  --   CREATININE 1.86* 1.80*   Estimated Creatinine Clearance: 27.4 ml/min (by C-G formula based on Cr of 1.8). No results found for this basename: VANCOTROUGH, Leodis Binet, VANCORANDOM, GENTTROUGH, GENTPEAK, GENTRANDOM, TOBRATROUGH, TOBRAPEAK, TOBRARND, AMIKACINPEAK, AMIKACINTROU, AMIKACIN,  in the last 72 hours   Microbiology: Recent Results (from the past 720 hour(s))  MRSA PCR SCREENING     Status: Abnormal   Collection Time    11/09/12 11:09 PM      Result Value Range Status   MRSA by PCR POSITIVE (*) NEGATIVE Final   Comment:            The GeneXpert MRSA Assay (FDA     approved for NASAL specimens     only), is one component of a     comprehensive MRSA colonization     surveillance program. It is not     intended to diagnose MRSA     infection nor to guide or     monitor treatment for     MRSA infections.     RESULT CALLED TO, READ BACK BY AND VERIFIED WITH:     Elson Clan RN [id number] AT 0201 SKEEN,P    Medical History: Past Medical History  Diagnosis Date  . CAD (coronary artery disease)     a. Nonobst by cath 11/11: LAD 40%, mid-dist 25-30%; prox CFX 30%, mid 40%; OM 50%; PDA 50%; PLV 50%;  EF 65%.  . NSTEMI (non-ST elevated myocardial infarction)     a. In setting of AFib with RVR 03/2010, type 2. b. Again in 11/2011 felt 2/2 afib.  . Atrial fibrillation     a. DCCV 11/17/11. b. On coumadin & amiodarone.  Marland Kitchen HOCM (hypertrophic obstructive cardiomyopathy)     a. Echo 11/2011:severe LVH, EF 65-70%, mid-cavity gradient up to . b. Echo 06/2012: EF 65-70%, severe LVH, grade 2 d/dysf.  Marland Kitchen Hypertension   . Tobacco abuse   . CKD (chronic kidney disease), stage IV   . Diastolic CHF     preserved LVF  . Iron deficiency anemia   . Third degree uterine prolapse   . Hx MRSA infection   . Hx of cardiovascular stress test     a. Lex MV 12/13:  EF 56%, no ischemia    . Diabetes   . Hyperlipidemia   . Respiratory failure     Multiple admissions for resp failure requiring intubation.  . Hypercalcemia   . LBBB (left bundle branch block)     History of transient LBBB  . C. difficile colitis 01/2012    Assessment: 72 y.o. F admitted to [facility] on 7/3 with SOB and acute hypoxemic respiratory failure thought to be due to CHF exacerbation. Pharmacy was consulted to start Vancomycin + Levaquin for empiric HCAP coverage (recent  admit in April 2014). Tmax/24h: 100.2, WBC 22.3, SCr 1.8, CrCl~25-30 ml/min.   The patient was on warfarin PTA for hx AFib with a SUBthereapeutic INR on admit. The patient's warfarin dose was given early this morning (0200) -- so full effects of this dose have yet to be seen. Will check another INR this evening to better access if the patient should receive any additional warfarin this evening.   Goal of Therapy:  Vancomycin trough level 15-20 mcg/ml Proper antibiotics for infection/cultures adjusted for renal/hepatic function   Plan:  1. Levaquin 750 mg IV x 1 now, followed by Levaquin 750 mg IV every 48 hours 2. Vancomycin 1250 mg IV x 1 now, followed by Vancomycin 750 mg IV every 24 hours 3. Will recheck INR at 1800 this evening and f/u with warfarin dosing at that time. 4.Will continue to follow renal function, culture  results, LOT, and antibiotic de-escalation plans   Georgina Pillion, PharmD, BCPS Clinical Pharmacist Pager: [phone] 11/10/2012 8:36 AM     Addum;  INR 1.55.  Will give coumadin 5 mg today. F/u daily PT/INR

## 2012-11-10 NOTE — Progress Notes (Signed)
AM labs sent.

## 2012-11-10 NOTE — Progress Notes (Signed)
Primary cardiologist: Dr. Rollene Rotunda  Subjective:   Awake on ventilator. Comfortable, denies pain. No current nausea or emesis.   Objective:   Temp:  [98.1 F (36.7 C)-100.2 F (37.9 C)] 100.2 F (37.9 C) (07/04 0745) Pulse Rate:  [47-104] 61 (07/04 0745) Resp:  [11-41] 15 (07/04 0745) BP: (53-198)/(29-117) 113/60 mmHg (07/04 0745) SpO2:  [81 %-100 %] 98 % (07/04 0745) FiO2 (%):  [70 %-100 %] 70 % (07/04 0745) Weight:  [164 lb 3.9 oz (74.5 kg)-173 lb 4.5 oz (78.6 kg)] 173 lb 4.5 oz (78.6 kg) (07/03 2215) Last BM Date: 11/09/12  Filed Weights   11/09/12 1747 11/09/12 2215  Weight: 164 lb 3.9 oz (74.5 kg) 173 lb 4.5 oz (78.6 kg)    Intake/Output Summary (Last 24 hours) at 11/10/12 0758 Last data filed at 11/10/12 0600  Gross per 24 hour  Intake  95.75 ml  Output    800 ml  Net -704.25 ml   Telemetry: Sinus rhythm.  Exam:  General: No distress.  Lungs: Largely clear - few rhonchi.  Cardiac: RRR with soft systolic murmur, no gallop.   Abdomen: NABS, NT.  Extremities: Mild edema.  Lab Results:  Basic Metabolic Panel:  Recent Labs Lab 11/09/12 1828 11/09/12 1849  NA 137 141  K 3.3* 3.4*  CL 105 110  CO2 18*  --   GLUCOSE 274* 278*  BUN 18 21  CREATININE 1.86* 1.80*  CALCIUM 10.1  --     Liver Function Tests:  Recent Labs Lab 11/09/12 1828  AST 26  ALT 15  ALKPHOS 195*  BILITOT 0.4  PROT 7.5  ALBUMIN 3.3*    CBC:  Recent Labs Lab 11/09/12 1828 11/09/12 1849  WBC 22.3*  --   HGB 10.2* 11.6*  HCT 31.7* 34.0*  MCV 80.3  --   PLT 271  --     Cardiac Enzymes:  Recent Labs Lab 11/10/12 0030  TROPONINI <0.30    BNP:  Recent Labs  10/31/12 2108 11/09/12 1827 11/10/12 0030  PROBNP 1008.0* 1423.0* 2952.0*    Coagulation:  Recent Labs Lab 11/10/12 0030  INR 1.42    ECG: Sinus rhythm with LVH and significant repolarization abnormalities.   Medications:   Scheduled Medications: . albuterol  6 puff Inhalation  Q4H  . amiodarone  200 mg Oral Daily  . amLODipine  5 mg Oral Daily  . atorvastatin  20 mg Oral q1800  . furosemide  40 mg Intravenous Q6H  . insulin aspart  1-3 Units Subcutaneous Q4H  . metoprolol tartrate  12.5 mg Oral BID  . nitroGLYCERIN  2-200 mcg/min Intravenous Once  . pantoprazole (PROTONIX) IV  40 mg Intravenous QHS  . sodium chloride  3 mL Intravenous Q12H  . Warfarin - Pharmacist Dosing Inpatient   Does not apply q1800     Infusions: . fentaNYL infusion INTRAVENOUS 100 mcg/hr (11/10/12 0500)  . propofol Stopped (11/09/12 2249)     PRN Medications:  sodium chloride, sodium chloride, albuterol, fentaNYL, ondansetron, sodium chloride   Assessment:   1. VDRF, hypoxic and hypercarbic RF at presentation. Looks comfortable now with improving ABG, ventilator weaning planned. CCM managing.  2. A/C diastolic CHF with significant airspace disease/pulmonary edema on presenting CXR. Now improved. Precipitating event not clear - reported to have gotten suddenly dyspneic while shopping. She indicates to me this AM that she has been taking her medications. Initial troponin I negative. ECG with repolarization abnormalities.  3. HOCM, LVEF 65-70%, grade 2 diastolic dysfunction  by echocardiogram April 2014.  4. History of AF - currently sinus rhythm. On amiodarone and Coumadin.   5. CKD, stage III-IV.  6. NOCAD by cath 2011, no clear ACS at this point.   Plan/Discussion:    Wean ventilator per CCM. Continue Norvasc, amiodarone, IV Lasix, lopressor, Coumadin per pharmacy. Cycle cardiac markers. Focus of cardiac management at this point will be HR and BP control with HOCM and diuresis. Watch for PAF which could be problematic.   Jonelle Sidle, M.D., F.A.C.C.

## 2012-11-10 NOTE — Progress Notes (Signed)
INR resulted as subtherapeutic (1.42); will give boosted dose of Coumadin 6mg  x1 now and monitor INR for dose adjustments.  Vernard Gambles, PharmD, BCPS 11/10/2012 1:30 AM

## 2012-11-11 ENCOUNTER — Inpatient Hospital Stay (HOSPITAL_COMMUNITY): Payer: Medicare Other

## 2012-11-11 DIAGNOSIS — I5022 Chronic systolic (congestive) heart failure: Secondary | ICD-10-CM

## 2012-11-11 LAB — POCT I-STAT 3, ART BLOOD GAS (G3+)
Bicarbonate: 21.5 mEq/L (ref 20.0–24.0)
O2 Saturation: 99 %
TCO2: 23 mmol/L (ref 0–100)
pCO2 arterial: 51.7 mmHg — ABNORMAL HIGH (ref 35.0–45.0)
pO2, Arterial: 187 mmHg — ABNORMAL HIGH (ref 80.0–100.0)

## 2012-11-11 LAB — BLOOD GAS, ARTERIAL
Acid-base deficit: 4.5 mmol/L — ABNORMAL HIGH (ref 0.0–2.0)
Drawn by: 31101
Drawn by: 36527
MECHVT: 540 mL
O2 Saturation: 95.4 %
O2 Saturation: 83.5 %
PEEP: 10 cmH2O
PEEP: 10 cmH2O
Patient temperature: 98.1
Patient temperature: 100.4
RATE: 20 resp/min
RATE: 12 resp/min

## 2012-11-11 LAB — CBC WITH DIFFERENTIAL/PLATELET
Eosinophils Absolute: 0 10*3/uL (ref 0.0–0.7)
Eosinophils Relative: 0 % (ref 0–5)
Lymphs Abs: 0.6 10*3/uL — ABNORMAL LOW (ref 0.7–4.0)
MCH: 25.6 pg — ABNORMAL LOW (ref 26.0–34.0)
MCV: 81.6 fL (ref 78.0–100.0)
Monocytes Relative: 5 % (ref 3–12)
Platelets: 203 10*3/uL (ref 150–400)
RBC: 2.93 MIL/uL — ABNORMAL LOW (ref 3.87–5.11)

## 2012-11-11 LAB — GLUCOSE, CAPILLARY
Glucose-Capillary: 104 mg/dL — ABNORMAL HIGH (ref 70–99)
Glucose-Capillary: 143 mg/dL — ABNORMAL HIGH (ref 70–99)

## 2012-11-11 LAB — BASIC METABOLIC PANEL
BUN: 37 mg/dL — ABNORMAL HIGH (ref 6–23)
Calcium: 10.8 mg/dL — ABNORMAL HIGH (ref 8.4–10.5)
Creatinine, Ser: 2.43 mg/dL — ABNORMAL HIGH (ref 0.50–1.10)
GFR calc non Af Amer: 19 mL/min — ABNORMAL LOW (ref >90)
Glucose, Bld: 148 mg/dL — ABNORMAL HIGH (ref 70–99)
Sodium: 139 mEq/L (ref 135–145)

## 2012-11-11 MED ORDER — ONDANSETRON HCL 4 MG/2ML IJ SOLN
4.0000 mg | Freq: Three times a day (TID) | INTRAMUSCULAR | Status: DC | PRN
  Administered 2012-11-11: 4 mg via INTRAVENOUS
  Filled 2012-11-11: qty 2

## 2012-11-11 MED ORDER — ACETAMINOPHEN 160 MG/5ML PO SOLN
650.0000 mg | Freq: Four times a day (QID) | ORAL | Status: DC | PRN
  Administered 2012-11-11: 650 mg
  Filled 2012-11-11: qty 20.3

## 2012-11-11 MED ORDER — SODIUM CHLORIDE 0.9 % IV BOLUS (SEPSIS)
500.0000 mL | Freq: Once | INTRAVENOUS | Status: AC
  Administered 2012-11-11: 500 mL via INTRAVENOUS

## 2012-11-11 MED ORDER — SODIUM CHLORIDE 0.9 % IV BOLUS (SEPSIS)
250.0000 mL | Freq: Once | INTRAVENOUS | Status: AC
  Administered 2012-11-11: 250 mL via INTRAVENOUS

## 2012-11-11 MED ORDER — WARFARIN SODIUM 3 MG PO TABS
3.0000 mg | ORAL_TABLET | Freq: Once | ORAL | Status: AC
  Administered 2012-11-11: 3 mg via ORAL
  Filled 2012-11-11: qty 1

## 2012-11-11 MED ORDER — POLYETHYLENE GLYCOL 3350 17 G PO PACK
17.0000 g | PACK | Freq: Two times a day (BID) | ORAL | Status: DC
  Administered 2012-11-11 – 2012-11-21 (×8): 17 g
  Filled 2012-11-11 (×23): qty 1

## 2012-11-11 MED ORDER — FUROSEMIDE 10 MG/ML IJ SOLN
INTRAMUSCULAR | Status: AC
  Filled 2012-11-11: qty 4

## 2012-11-11 MED ORDER — FUROSEMIDE 10 MG/ML IJ SOLN
40.0000 mg | Freq: Once | INTRAMUSCULAR | Status: AC
  Administered 2012-11-11: 40 mg via INTRAVENOUS

## 2012-11-11 MED ORDER — DOCUSATE SODIUM 50 MG/5ML PO LIQD
100.0000 mg | Freq: Two times a day (BID) | ORAL | Status: DC
  Administered 2012-11-11 – 2012-11-20 (×8): 100 mg
  Filled 2012-11-11 (×21): qty 10

## 2012-11-11 NOTE — Progress Notes (Signed)
eLink Physician-Brief Progress Note Patient Name: [name] DOB: 04-Oct-1940 MRN: [id number]  Date of Service  11/11/2012   HPI/Events of Note  Patietnt with mixed resp and metabolic acidosis with pH 7.23/52/187/21   eICU Interventions  Plan: Increase TV to 540cc Repeat ABG in 1 hour   Intervention Category Major Interventions: Acid-Base disturbance - evaluation and management  DETERDING,ELIZABETH 11/11/2012, 3:31 AM

## 2012-11-11 NOTE — Progress Notes (Signed)
eLink Physician-Brief Progress Note Patient Name: [name] DOB: 13-Nov-1940 MRN: [id number]  Date of Service  11/11/2012   HPI/Events of Note   Called by nurse that pt has MAP in high 50s and decresaed urine output  eICU Interventions  Gave 500cc NS bolus.    Intervention Category Intermediate Interventions: Hypovolemia - evaluation and management  GIDDINGS, OLIVIA K. 11/11/2012, 3:56 PM

## 2012-11-11 NOTE — Progress Notes (Signed)
Patient's O2 Saturations dropped to 88-89% on 50 FIO2.  FiO2 increased to 100%.   Patient suctioned for copious amounts thick, brownish secretions.  Patient gagged, vomited.  Tube feeding stopped, OG tube connected to low intermittent wall suction.  Obtained approx. 150cc Laker Thompson gastric secretions.  RT at bedside.  Patient placed on 80% Fio2 with O2 Sats 97-98%.  Dr. Darrick Penna notified of all parameters.  Emotional support to patient.

## 2012-11-11 NOTE — Progress Notes (Signed)
Primary cardiologist: Dr. Rollene Rotunda  Subjective:   Awake on ventilator. Still on 50% FiO2. Off lasix due to hypotension. CVP 6  Objective:   Temp:  [98.1 F (36.7 C)-100.8 F (38.2 C)] 99.9 F (37.7 C) (07/05 0800) Pulse Rate:  [56-78] 57 (07/05 0800) Resp:  [17-25] 25 (07/05 0800) BP: (90-131)/(45-76) 104/48 mmHg (07/05 0800) SpO2:  [94 %-100 %] 100 % (07/05 0800) FiO2 (%):  [50 %-80 %] 50 % (07/05 0800) Weight:  [78.4 kg (172 lb 13.5 oz)] 78.4 kg (172 lb 13.5 oz) (07/05 0443) Last BM Date: 11/09/12  Filed Weights   11/09/12 1747 11/09/12 2215 11/11/12 0443  Weight: 74.5 kg (164 lb 3.9 oz) 78.6 kg (173 lb 4.5 oz) 78.4 kg (172 lb 13.5 oz)    Intake/Output Summary (Last 24 hours) at 11/11/12 0903 Last data filed at 11/11/12 0800  Gross per 24 hour  Intake 1642.67 ml  Output   1020 ml  Net 622.67 ml   Telemetry: Sinus rhythm 50s  Exam:  General: No distress. Awake on vent  Lungs: LClear  Cardiac: RRR with soft systolic murmur, no gallop.   Abdomen: NABS, NT.ND  Extremities: No c/c/e. Warm no rash  Neuro: Awake and nonfocal  Lab Results:  Basic Metabolic Panel:  Recent Labs Lab 11/09/12 1828 11/09/12 1849 11/10/12 0700 11/11/12 0345  NA 137 141 139 139  K 3.3* 3.4* 3.9 4.3  CL 105 110 107 109  CO2 18*  --  22 23  GLUCOSE 274* 278* 110* 148*  BUN 18 21 22  37*  CREATININE 1.86* 1.80* 2.33* 2.43*  CALCIUM 10.1  --  10.4 10.8*  MG  --   --  1.9 1.8    Liver Function Tests:  Recent Labs Lab 11/09/12 1828  AST 26  ALT 15  ALKPHOS 195*  BILITOT 0.4  PROT 7.5  ALBUMIN 3.3*    CBC:  Recent Labs Lab 11/09/12 1828 11/09/12 1849 11/10/12 0700 11/11/12 0345  WBC 22.3*  --  12.2* 11.1*  HGB 10.2* 11.6* 8.1* 7.5*  HCT 31.7* 34.0* 25.4* 23.9*  MCV 80.3  --  80.1 81.6  PLT 271  --  235 203    Cardiac Enzymes:  Recent Labs Lab 11/10/12 0030 11/10/12 0700 11/10/12 1330  TROPONINI <0.30 <0.30 <0.30    BNP:  Recent Labs  10/31/12 2108 11/09/12 1827 11/10/12 0030  PROBNP 1008.0* 1423.0* 2952.0*    Coagulation:  Recent Labs Lab 11/10/12 0700 11/10/12 1800 11/11/12 0345  INR 1.44 1.55* 1.67*    ECG: Sinus rhythm with LVH and significant repolarization abnormalities.   Medications:   Scheduled Medications: . albuterol  6 puff Inhalation Q4H  . amiodarone  200 mg Oral Daily  . amLODipine  5 mg Oral Daily  . antiseptic oral rinse  15 mL Mouth Rinse BID  . atorvastatin  20 mg Oral q1800  . cefTRIAXone (ROCEPHIN)  IV  1 g Intravenous Q24H  . chlorhexidine  15 mL Mouth/Throat BID  . feeding supplement (OSMOLITE 1.5 CAL)  1,000 mL Per Tube Q24H  . feeding supplement  30 mL Per Tube 5 X Daily  . insulin aspart  1-3 Units Subcutaneous Q4H  . [START ON 11/12/2012] levofloxacin (LEVAQUIN) IV  750 mg Intravenous Q48H  . metoprolol tartrate  12.5 mg Oral BID  . multivitamin  5 mL Per Tube Daily  . nitroGLYCERIN  2-200 mcg/min Intravenous Once  . pantoprazole (PROTONIX) IV  40 mg Intravenous QHS  . sodium chloride  3 mL Intravenous Q12H  . vancomycin  750 mg Intravenous Q24H  . Warfarin - Pharmacist Dosing Inpatient   Does not apply q1800    Infusions: . fentaNYL infusion INTRAVENOUS 100 mcg/hr (11/10/12 2000)  . propofol Stopped (11/09/12 2249)    PRN Medications: sodium chloride, sodium chloride, albuterol, fentaNYL, midazolam, ondansetron, ondansetron, sodium chloride   Assessment:   1. VDRF, hypoxic and hypercarbic RF at presentation. Looks comfortable now with improving ABG, ventilator weaning planned. CCM managing.  2. A/C diastolic CHF   3. HOCM, LVEF 65-70%, grade 2 diastolic dysfunction by echocardiogram April 2014.  4. History of AF - currently sinus rhythm. On amiodarone and Coumadin.   5. CKD, stage III-IV.  6. No CAD by cath 2011, no clear ACS at this point.   Plan/Discussion:    Given low CVP. CHF doesn't seem to be major factor in vent wean at this point. Agree with  current management.  Use prn lasix to keep CVP < 10.  Vent wean per CCM. Cardiology will see again on Monday.  Siana Panameno,MD 9:06 AM

## 2012-11-11 NOTE — Progress Notes (Signed)
ANTICOAGULATION CONSULT NOTE - Follow Up Consult  Pharmacy Consult for Warfarin Indication: atrial fibrillation  No Known Allergies  Patient Measurements: Height: 5\' 2"  (157.5 cm) Weight: 172 lb 13.5 oz (78.4 kg) IBW/kg (Calculated) : 50.1  Vital Signs: Temp: 99.9 F (37.7 C) (07/05 0800) Temp src: Core (Comment) (07/05 0800) BP: 101/47 mmHg (07/05 1100) Pulse Rate: 58 (07/05 1100)  Labs:  Recent Labs  11/09/12 1828 11/09/12 1849  11/10/12 0030 11/10/12 0700 11/10/12 1330 11/10/12 1800 11/11/12 0345  HGB 10.2* 11.6*  --   --  8.1*  --   --  7.5*  HCT 31.7* 34.0*  --   --  25.4*  --   --  23.9*  PLT 271  --   --   --  235  --   --  203  LABPROT  --   --   < > 17.0* 17.2*  --  18.2* 19.2*  INR  --   --   < > 1.42 1.44  --  1.55* 1.67*  CREATININE 1.86* 1.80*  --   --  2.33*  --   --  2.43*  TROPONINI  --   --   --  <0.30 <0.30 <0.30  --   --   < > = values in this interval not displayed.  Estimated Creatinine Clearance: 20.3 ml/min (by C-G formula based on Cr of 2.43).   Assessment: 72 y.o. F resumed on warfarin from PTA for hx Afib. The patient was noted to have a SUBtherapeutic INR on admit -- PTA dose was 2.5 mg daily EXCEPT for 5 mg on Mondays only. INR this morning remains SUBtherapeutic (INR 1.67 << 1.55, goal of 2-3). Hgb/Hct slight drop, plts wnl -- discussed any bleeding with RN-Josh (secretions, urine, etc) -- and none noted. Will continue to monitor/watch closely.   Goal of Therapy:  INR 2-3 Monitor platelets by anticoagulation protocol: Yes   Plan:  1. Warfarin 3 mg x 1 dose at 1800 today 2. Will continue to monitor for any signs/symptoms of bleeding and will follow up with PT/INR in the a.m.   Georgina Pillion, PharmD, BCPS Clinical Pharmacist Pager: [phone] 11/11/2012 11:29 AM

## 2012-11-11 NOTE — Progress Notes (Signed)
Patient using accessory muscles to breath while on vent.  O2 sats ranging from 95-87%.  Dr. Belinda Block notified of all parameters.  Orders received. 2025:  Jasma Seevers daughter notified of all parameters.  Emotional support to family.

## 2012-11-11 NOTE — Progress Notes (Signed)
Dr Tyson Alias notified of pt's low PB 75/45, P54, CVP 8. Instructed to hold norvasc and lopressor and administer of NS bolus. Pt easily follows commands, resting comfortably.

## 2012-11-11 NOTE — Progress Notes (Signed)
eLink Physician-Brief Progress Note Patient Name: [name] DOB: [birth year] MRN: [id number]  Date of Service  11/11/2012   HPI/Events of Note   Temp greater than 101F  eICU Interventions  Plan: Already broadly covered for aspiration event.  LFTs are WNL. Tylenol 650 mg via tube q6 hours prn   Intervention Category Minor Interventions: Routine modifications to care plan (e.g. PRN medications for pain, fever)  DETERDING,ELIZABETH 11/11/2012, 11:53 PM

## 2012-11-11 NOTE — Progress Notes (Signed)
Sao2 remained 87% on 100% Fio2, Dr Belinda Block notified and order received to increase Peep to 10, portable chest x-ray, and ABG. Rt in room. SaO2 on 10 Peep and 100% Fio2 = 94%.

## 2012-11-11 NOTE — Progress Notes (Addendum)
After 1800 turn pt's SaO2 dropped from 99% to 77%. Placed on 100% FiO2 = SaO2 88%, lung sounds remained uncharged, suctioned minimal tan secretions. RT and Elink (Dr Belinda Block) notified. Recruitment maneuver per rt. Peep increased to 8. Fio2 down to 60%, SaO2 93-99% at this time. No signs or distress noted. Pt awake and alert following commands. Monitoring closely.

## 2012-11-11 NOTE — Progress Notes (Signed)
PULMONARY  / CRITICAL CARE MEDICINE  Name: [name] MRN: [id number] DOB: Aug 05, 1940    ADMISSION DATE:  11/09/2012 CONSULTATION DATE:  11/09/2012  REFERRING MD :  Preston Fleeting PRIMARY SERVICE: PCCM  CHIEF COMPLAINT:  Shortness of breath  BRIEF PATIENT DESCRIPTION: 72 y/o female with CHF was admitted on 7/3 from the [facility] ED with acute hypoxemic respiratory failure due to a CHF exacerbation, ? Superimposed PNA  SIGNIFICANT EVENTS / STUDIES:  7/3 admission  LINES / TUBES: 7/3 ETT >> 7/4 CVC R IJ >>   CULTURES: 7/3 respiratory >> 7/4 U strep>>> neg 7/4 legionella>>> 7/3 mrsa PCR: POSITIVE  ANTIBIOTICS: 7/3 Ceftriaxone>>> 7/4 levofloxacin>>> 7/4 Vancomycin>>>  SUBJECTIVE:  Intubated and sedated  VITAL SIGNS: Temp:  [98.1 F (36.7 C)-100.8 F (38.2 C)] 98.1 F (36.7 C) (07/05 0400) Pulse Rate:  [56-78] 59 (07/05 0700) Resp:  [15-21] 20 (07/05 0700) BP: (90-131)/(45-76) 115/58 mmHg (07/05 0700) SpO2:  [94 %-100 %] 100 % (07/05 0700) FiO2 (%):  [50 %-80 %] 50 % (07/05 0600) Weight:  [78.4 kg (172 lb 13.5 oz)] 78.4 kg (172 lb 13.5 oz) (07/05 0443) HEMODYNAMICS: CVP:  [9 mmHg-18 mmHg] 10 mmHg VENTILATOR SETTINGS: Vent Mode:  [-] PRVC FiO2 (%):  [50 %-80 %] 50 % Set Rate:  [20 bmp] 20 bmp Vt Set:  [440 mL-540 mL] 540 mL PEEP:  [10 cmH20] 10 cmH20 Plateau Pressure:  [17 cmH20-22 cmH20] 17 cmH20 INTAKE / OUTPUT: Intake/Output     07/04 0701 - 07/05 0700 07/05 0701 - 07/06 0700   I.V. (mL/kg) 701.7 (8.9)    NG/GT 341    IV Piggyback 600    Total Intake(mL/kg) 1642.7 (21)    Urine (mL/kg/hr) 765 (0.4)    Emesis/NG output 300 (0.2)    Total Output 1065     Net +577.7            PHYSICAL EXAMINATION:  Gen: sedated on vent, no acute distress HEENT: NCAT, PERRL, EOMi, ETT in place PULM: rhonchi in bases, no wheezing,  CV: Tachy, distant heart sounds, no JVD AB: BS+, soft, nontender, no hsm Ext: warm, trace edema, no clubbing, no cyanosis Derm: no rash or skin  breakdown Neuro: Sedated on vent but arouses to voice, follows commands, maew LABS:  Recent Labs Lab 11/09/12 1828 11/09/12 1849 11/10/12 0700 11/11/12 0345  NA 137 141 139 139  K 3.3* 3.4* 3.9 4.3  CL 105 110 107 109  CO2 18*  --  22 23  BUN 18 21 22  37*  CREATININE 1.86* 1.80* 2.33* 2.43*  GLUCOSE 274* 278* 110* 148*    Recent Labs Lab 11/09/12 1828 11/09/12 1849 11/10/12 0700 11/11/12 0345  HGB 10.2* 11.6* 8.1* 7.5*  HCT 31.7* 34.0* 25.4* 23.9*  WBC 22.3*  --  12.2* 11.1*  PLT 271  --  235 203    CXR: Vascular congestion present, but overall aeration much improved.  EKG: Sinus tach with left bundle morphology  ASSESSMENT / PLAN:  PULMONARY A: Acute hypercarbic and hypoxemic respiratory failure in setting of bilateral pulmonary infiltrates. Suspect CHF and decomp edema, but CAP w/ ALI on diff dx.  COPD, but not clearly in exacerbation, unclear how severe her disease is at baseline P:   -full vent support -wean wean PEEP/FiO2 as tolerated, on minimum now -scheduled and prn albuterol -SBT planned today, goal 2 hrs -have maximized lasix, improved int changes -ABg reviewed, MV increase -see ID section -control afterload  CARDIOVASCULAR A:  Acute decompensated CHF, uncertain  etiology of today's presentation HOCM Afib History of noncompliance P:  - Will hold lasix today due to increase in creatinine - Follow CVP trend - Continue antihypertensives  RENAL A:   AKI- likely prerenal in origin (FeNa 0.8%) CKD P:   - Ensure renal perfusion with MAP >60 - monitor UOP  - repeat BMET in AM - replete electrolytes as indicated -dc lasix  GASTROINTESTINAL A:  No acute issues P:   - Protonix for SUP - Continue enteral feeds  HEMATOLOGIC A:  Warfarin for A-fib P:  -warfarin per pharmacy consult - Daily INR until therapeutic  INFECTIOUS A:  Leukocytosis new infiltrate in RUL (not clear) with elevated procalcitonin P:   - Continue rocephin and  levaquin for CAP - 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 She meets SIRS criteria with a potential infiltrate in her RUL possible. Had episode of emesis overnight in the setting of being laid flat and getting a bath, will resume enteral feeds and closely assess for tolerance. Will continue to wean vent support as tolerated.  Hold lasix, SBT  [employer] S-ACNP  I have fully examined this patient and agree with above findings.    And edited in full  Ccm time 30 min   Mcarthur Rossetti. Tyson Alias, MD, FACP Pgr: [phone] [place] [facility]

## 2012-11-11 NOTE — Progress Notes (Signed)
eLink Physician-Brief Progress Note Patient Name: [name] DOB: 04-28-1941 MRN: [id number]  Date of Service  11/11/2012   HPI/Events of Note   Pt with hypoxemic event to the 70s after being moved requiring increase in FiO2 to 1005 (previously on 50%) to maintain sats of 88%  eICU Interventions  Respiratory will do recruitment maneuver, incrase peep to 8 from 5 after recruitment and ween FiO2 back down.    Intervention Category Major Interventions: Hypoxemia - evaluation and management  GIDDINGS, OLIVIA K. 11/11/2012, 6:29 PM

## 2012-11-12 ENCOUNTER — Inpatient Hospital Stay (HOSPITAL_COMMUNITY): Payer: Medicare Other

## 2012-11-12 LAB — BLOOD GAS, ARTERIAL
Acid-base deficit: 5.5 mmol/L — ABNORMAL HIGH (ref 0.0–2.0)
FIO2: 1 %
TCO2: 22 mmol/L (ref 0–100)
pCO2 arterial: 47.8 mmHg — ABNORMAL HIGH (ref 35.0–45.0)
pO2, Arterial: 69.3 mmHg — ABNORMAL LOW (ref 80.0–100.0)

## 2012-11-12 LAB — CBC
HCT: 24.3 % — ABNORMAL LOW (ref 36.0–46.0)
MCHC: 32.5 g/dL (ref 30.0–36.0)
MCV: 81.5 fL (ref 78.0–100.0)
RDW: 17.9 % — ABNORMAL HIGH (ref 11.5–15.5)

## 2012-11-12 LAB — LEGIONELLA ANTIGEN, URINE

## 2012-11-12 LAB — POCT I-STAT 3, ART BLOOD GAS (G3+)
Acid-base deficit: 6 mmol/L — ABNORMAL HIGH (ref 0.0–2.0)
Bicarbonate: 20.8 mEq/L (ref 20.0–24.0)
O2 Saturation: 85 %
O2 Saturation: 98 %
Patient temperature: 99.5
Patient temperature: 99.4
TCO2: 22 mmol/L (ref 0–100)
TCO2: 22 mmol/L (ref 0–100)
pCO2 arterial: 44.9 mmHg (ref 35.0–45.0)

## 2012-11-12 LAB — BASIC METABOLIC PANEL
CO2: 22 mEq/L (ref 19–32)
Calcium: 10.5 mg/dL (ref 8.4–10.5)
GFR calc non Af Amer: 17 mL/min — ABNORMAL LOW (ref >90)
Potassium: 3.7 mEq/L (ref 3.5–5.1)
Sodium: 139 mEq/L (ref 135–145)

## 2012-11-12 LAB — URINE CULTURE: Culture: NO GROWTH

## 2012-11-12 LAB — PROTIME-INR
INR: 2.11 — ABNORMAL HIGH (ref 0.00–1.49)
Prothrombin Time: 23 seconds — ABNORMAL HIGH (ref 11.6–15.2)

## 2012-11-12 LAB — GLUCOSE, CAPILLARY
Glucose-Capillary: 217 mg/dL — ABNORMAL HIGH (ref 70–99)
Glucose-Capillary: 232 mg/dL — ABNORMAL HIGH (ref 70–99)

## 2012-11-12 LAB — MAGNESIUM: Magnesium: 1.9 mg/dL (ref 1.5–2.5)

## 2012-11-12 MED ORDER — METHYLPREDNISOLONE SODIUM SUCC 40 MG IJ SOLR
40.0000 mg | Freq: Two times a day (BID) | INTRAMUSCULAR | Status: DC
  Administered 2012-11-12 – 2012-11-16 (×9): 40 mg via INTRAVENOUS
  Filled 2012-11-12 (×11): qty 1

## 2012-11-12 MED ORDER — IPRATROPIUM BROMIDE 0.02 % IN SOLN
0.5000 mg | Freq: Four times a day (QID) | RESPIRATORY_TRACT | Status: DC
  Administered 2012-11-12 – 2012-11-15 (×14): 0.5 mg via RESPIRATORY_TRACT
  Filled 2012-11-12 (×14): qty 2.5

## 2012-11-12 MED ORDER — ALBUTEROL SULFATE (5 MG/ML) 0.5% IN NEBU
2.5000 mg | INHALATION_SOLUTION | RESPIRATORY_TRACT | Status: DC
  Administered 2012-11-12 – 2012-11-13 (×8): 2.5 mg via RESPIRATORY_TRACT
  Filled 2012-11-12 (×8): qty 0.5

## 2012-11-12 MED ORDER — FUROSEMIDE 10 MG/ML IJ SOLN
40.0000 mg | Freq: Once | INTRAMUSCULAR | Status: AC
  Administered 2012-11-12: 40 mg via INTRAVENOUS

## 2012-11-12 MED ORDER — MIDAZOLAM HCL 2 MG/2ML IJ SOLN
2.0000 mg | INTRAMUSCULAR | Status: DC | PRN
  Administered 2012-11-12: 1 mg via INTRAVENOUS
  Administered 2012-11-12 – 2012-11-14 (×3): 2 mg via INTRAVENOUS
  Filled 2012-11-12 (×4): qty 2

## 2012-11-12 NOTE — Progress Notes (Signed)
O2 Sats fall when patient placed in low position.  Will place bed in rotation mode and maintain HOB>30 degrees until O2 sats more consistently stable.

## 2012-11-12 NOTE — Progress Notes (Signed)
PULMONARY  / CRITICAL CARE MEDICINE  Name: [name] MRN: [id number] DOB: 1940/07/07    ADMISSION DATE:  11/09/2012 CONSULTATION DATE:  11/09/2012  REFERRING MD :  Preston Fleeting PRIMARY SERVICE: PCCM  CHIEF COMPLAINT:  Shortness of breath  BRIEF PATIENT DESCRIPTION: 72 y/o female with CHF was admitted on 7/3 from the [facility] ED with acute hypoxemic respiratory failure due to a CHF exacerbation, ? Superimposed PNA  SIGNIFICANT EVENTS / STUDIES:  7/3 admission  LINES / TUBES: 7/3 ETT >> 7/4 CVC R IJ >>  7/5 A-line >>  CULTURES: 7/3 respiratory >> 7/4 U strep>>> neg 7/4 legionella>>> 7/3 mrsa PCR: POSITIVE  ANTIBIOTICS: 7/3 Ceftriaxone>>>7/6 7/4 levofloxacin>>> 7/4 Vancomycin>>>7/6  SUBJECTIVE:  Follows commands Worsening peep needs, fio2 and MV  VITAL SIGNS: Temp:  [99 F (37.2 C)-101.7 F (38.7 C)] 99.3 F (37.4 C) (07/06 0830) Pulse Rate:  [53-85] 82 (07/06 0830) Resp:  [11-27] 21 (07/06 0830) BP: (72-130)/(35-71) 121/53 mmHg (07/06 0353) SpO2:  [81 %-100 %] 81 % (07/06 0828) Arterial Line BP: (99-167)/(44-95) 167/76 mmHg (07/06 0830) FiO2 (%):  [40 %-100 %] 90 % (07/06 0828) Weight:  [79.2 kg (174 lb 9.7 oz)] 79.2 kg (174 lb 9.7 oz) (07/06 0447) HEMODYNAMICS: CVP:  [8 mmHg-12 mmHg] 12 mmHg VENTILATOR SETTINGS: Vent Mode:  [-] PRVC FiO2 (%):  [40 %-100 %] 90 % Set Rate:  [12 bmp-20 bmp] 16 bmp Vt Set:  [540 mL] 540 mL PEEP:  [5 cmH20-12 cmH20] 12 cmH20 Pressure Support:  [10 cmH20] 10 cmH20 Plateau Pressure:  [19 cmH20-20 cmH20] 20 cmH20 INTAKE / OUTPUT: Intake/Output     07/05 0701 - 07/06 0700 07/06 0701 - 07/07 0700   I.V. (mL/kg) 623.6 (7.9) 35.5 (0.4)   NG/GT 620 20   IV Piggyback 950    Total Intake(mL/kg) 2193.6 (27.7) 55.5 (0.7)   Urine (mL/kg/hr) 1567 (0.8) 210 (1.6)   Emesis/NG output     Total Output 1567 210   Net +626.6 -154.5          PHYSICAL EXAMINATION:  Gen: sedated on vent,  HEENT: NCAT, PERRL, EOMi, ETT in place PULM: rhonchi in  bases, no wheezing,  CV: Tachy, distant heart sounds, no JVD AB: BS+, soft, nontender, no hsm Ext: warm, trace edema, no clubbing, no cyanosis Derm: no rash or skin breakdown Neuro: Sedated on vent but arouses to voice, follows commands, maew LABS:  Recent Labs Lab 11/10/12 0700 11/11/12 0345 11/12/12 0400  NA 139 139 139  K 3.9 4.3 3.7  CL 107 109 110  CO2 22 23 22   BUN 22 37* 47*  CREATININE 2.33* 2.43* 2.58*  GLUCOSE 110* 148* 153*    Recent Labs Lab 11/10/12 0700 11/11/12 0345 11/12/12 0400  HGB 8.1* 7.5* 6.8*  HCT 25.4* 23.9* 22.1*  WBC 12.2* 11.1* 10.5  PLT 235 203 196    CXR: Increased bilateral opacities  EKG: Sinus tach with left bundle morphology  ASSESSMENT / PLAN:  PULMONARY A: Acute hypercarbic and hypoxemic respiratory failure in setting of bilateral pulmonary infiltrates. Suspect CHF and decomp edema, but CAP w/ ALI on diff dx.  COPD, vent mechanics suggest possible element of AECOPD P:   -full vent support-->have changed her to PCV, abg reviewed, increase rate, change to 15/15, total pressures 30 -abg in 1 hr -consider permissive hypercapnia, may be needed -hope edema will be peep responsive -scheduled BD (escalated)  -Solumedrol, attempt short low course then reduce -lasix today -F/U abg -Wean FiO2 as tolerated  CARDIOVASCULAR  A:  Acute decompensated CHF HOCM Afib History of noncompliance P:  - Follow CVP trend - Hold antihypertensives due to hypotension overnight requiring fluid boluses -lasix  RENAL A:   AKI- likely prerenal in origin (FeNa 0.8%) CKD P:   - Ensure renal perfusion with MAP >60 - monitor UOP  - repeat BMET in AM - replete electrolytes as indicated -concern with lasix with rise crt, will follow trend  GASTROINTESTINAL A:  No acute issues P:   - Protonix for SUP - Continue enteral feeds  HEMATOLOGIC A:  Warfarin for A-fib, anemia (diltion) P:  -warfarin per pharmacy consult - Daily INR until  therapeutic -1 unit written for  INFECTIOUS A:  Leukocytosis new infiltrate in RUL (not clear) with elevated procalcitonin, clinically unimpressive infection P:   - Continue levaquin for CAP - D/C vancomycin - D/C Rocephin - Follow WBC  ENDOCRINE A:  DM2 P:   -ICU hyperglycemia protocol  NEUROLOGIC A:  PCV P:   - Fentanyl infusion - PRN Versed -Rass deeper  TODAY'S SUMMARY: [name] presented with acute resp failure from either COPD flare or CHF exacerbation. Overnight had respiratory distress and decreased SpO2. On exam this morning was using accessory muscles to breathe with a prolonged exhalation time, not returning to baseline on vent. Plan for aggressive bronchodilators, add corticosteroids, and give a dose of lasix today to improve her ventilation. PCV changes further,  [employer] S-ACNP  Ccm time 30 min   Mcarthur Rossetti. Tyson Alias, MD, FACP Pgr: [phone]  Pulmonary & Critical Care

## 2012-11-12 NOTE — Progress Notes (Addendum)
Bedside RN Perla Sink reported a Hgb 7.5 and that was reported to Dr E Deterding

## 2012-11-12 NOTE — Progress Notes (Signed)
eLink Physician-Brief Progress Note Patient Name: [name] DOB: 11-03-1940 MRN: [id number]  Date of Service  11/12/2012   HPI/Events of Note  Hgb of 6.8 down from 7.5.  No obvious blood source loss but is on coumadin.   eICU Interventions  Plan: Txf 1 unit pRBC Post-txf CBC Monitor for blood loss   Intervention Category Intermediate Interventions: Bleeding - evaluation and treatment with blood products  Nocole Zammit 11/12/2012, 5:18 AM

## 2012-11-12 NOTE — Progress Notes (Signed)
ANTICOAGULATION CONSULT NOTE - Follow Up Consult  Pharmacy Consult for Warfarin Indication: atrial fibrillation  No Known Allergies  Patient Measurements: Height: 5\' 2"  (157.5 cm) Weight: 174 lb 9.7 oz (79.2 kg) IBW/kg (Calculated) : 50.1  Vital Signs: Temp: 99.5 F (37.5 C) (07/06 0930) Temp src: Core (Comment) (07/06 0800) BP: 93/49 mmHg (07/06 0930) Pulse Rate: 64 (07/06 0930)  Labs:  Recent Labs  11/10/12 0030 11/10/12 0700 11/10/12 1330 11/10/12 1800 11/11/12 0345 11/12/12 0400  HGB  --  8.1*  --   --  7.5* 6.8*  HCT  --  25.4*  --   --  23.9* 22.1*  PLT  --  235  --   --  203 196  LABPROT 17.0* 17.2*  --  18.2* 19.2* 23.0*  INR 1.42 1.44  --  1.55* 1.67* 2.11*  CREATININE  --  2.33*  --   --  2.43* 2.58*  TROPONINI <0.30 <0.30 <0.30  --   --   --     Estimated Creatinine Clearance: 19.2 ml/min (by C-G formula based on Cr of 2.58).   Assessment: 72 y.o. F resumed on warfarin from PTA for hx Afib. The patient was noted to have a SUBtherapeutic INR on admit -- PTA dose was 2.5 mg daily EXCEPT for 5 mg on Mondays only. INR this morning is therapeutic (INR 2.11 << 1.67 , goal of 2-3). Hgb/Hct drop again today (Hgb 6.8 << 7.5 << 8.1 << 11.6), plts wnl. Pt receiving 1 unit PRBC. No bleeding noted per nurse report (secretions, urine, etc). Discussed with CCM today (Dr. Delton Coombes) -- will hold warfarin dose and monitor/watch Hgb trends closely.   Goal of Therapy:  INR 2-3 Monitor platelets by anticoagulation protocol: Yes   Plan:  1. Hold warfarin today 2. Will continue to monitor for any signs/symptoms of bleeding and will follow up with PT/INR in the a.m.   Georgina Pillion, PharmD, BCPS Clinical Pharmacist Pager: [phone] 11/12/2012 10:46 AM

## 2012-11-12 NOTE — Progress Notes (Signed)
CRITICAL VALUE ALERT  Critical value received:  Hemoglobin 6.8 Date of notification:  11/12/2012  Time of notification:  0513  Critical value read back:yes  Nurse who received alert:  Lorelee Cover  MD notified (1st page):  Dr. Bea Laura. Deterding  Time of first page:  0515  MD notified (2nd page):  Time of second page:  Responding MD:  Dr. Bea Laura Deterding   Time MD responded: [phone]

## 2012-11-12 NOTE — Procedures (Signed)
Arterial Catheter Insertion Procedure Note [name] [id number] July 20, 1940  Procedure: Insertion of Arterial Catheter  Indications: Frequent blood sampling  Procedure Details Consent: Risks of procedure as well as the alternatives and risks of each were explained to the (patient/caregiver).  Consent for procedure obtained. Time Out: Verified patient identification, verified procedure, site/side was marked, verified correct patient position, special equipment/implants available, medications/allergies/relevent history reviewed, required imaging and test results available.  Performed  Maximum sterile technique was used including antiseptics, cap, gloves, gown, hand hygiene, mask and sheet. Skin prep: Chlorhexidine; local anesthetic administered 20 gauge catheter was inserted into right radial artery using the Seldinger technique.  Evaluation Blood flow good; BP tracing good. Complications: No apparent complications.   Jyasia Markoff M 11/12/2012

## 2012-11-12 NOTE — Procedures (Deleted)
Arterial Catheter Insertion Procedure Note [name] [id number] Nov 03, 1940  Procedure: Insertion of Arterial Catheter  Indications: Frequent blood sampling  Procedure Details Consent: Risks of procedure as well as the alternatives and risks of each were explained to the (patient/caregiver).  Consent for procedure obtained. Time Out: Verified patient identification, verified procedure, site/side was marked, verified correct patient position, special equipment/implants available, medications/allergies/relevent history reviewed, required imaging and test results available.  Performed  Maximum sterile technique was used including antiseptics, cap, gloves, gown, hand hygiene, mask, sheet and  . Skin prep: Chlorhexidine; local anesthetic administered 20 gauge catheter was inserted into right radial artery using the Seldinger technique.  Evaluation Blood flow good; BP tracing good. Complications: No apparent complications.   Verlon Carcione M 11/12/2012

## 2012-11-13 ENCOUNTER — Inpatient Hospital Stay (HOSPITAL_COMMUNITY): Payer: Medicare Other

## 2012-11-13 DIAGNOSIS — N185 Chronic kidney disease, stage 5: Secondary | ICD-10-CM

## 2012-11-13 DIAGNOSIS — J810 Acute pulmonary edema: Secondary | ICD-10-CM

## 2012-11-13 LAB — POCT I-STAT 3, ART BLOOD GAS (G3+)
Acid-base deficit: 6 mmol/L — ABNORMAL HIGH (ref 0.0–2.0)
Bicarbonate: 20.3 mEq/L (ref 20.0–24.0)
Bicarbonate: 20.4 mEq/L (ref 20.0–24.0)
O2 Saturation: 99 %
Patient temperature: 36.9
TCO2: 22 mmol/L (ref 0–100)
pCO2 arterial: 53.4 mmHg — ABNORMAL HIGH (ref 35.0–45.0)
pCO2 arterial: 48.2 mmHg — ABNORMAL HIGH (ref 35.0–45.0)
pH, Arterial: 7.189 — CL (ref 7.350–7.450)
pH, Arterial: 7.236 — ABNORMAL LOW (ref 7.350–7.450)
pO2, Arterial: 56 mmHg — ABNORMAL LOW (ref 80.0–100.0)

## 2012-11-13 LAB — TYPE AND SCREEN: Unit division: 0

## 2012-11-13 LAB — CBC
MCH: 25.2 pg — ABNORMAL LOW (ref 26.0–34.0)
MCH: 25.9 pg — ABNORMAL LOW (ref 26.0–34.0)
MCHC: 30.8 g/dL (ref 30.0–36.0)
MCV: 80.7 fL (ref 78.0–100.0)
Platelets: 196 10*3/uL (ref 150–400)
Platelets: 212 10*3/uL (ref 150–400)
RBC: 2.7 MIL/uL — ABNORMAL LOW (ref 3.87–5.11)
RBC: 3.05 MIL/uL — ABNORMAL LOW (ref 3.87–5.11)

## 2012-11-13 LAB — BASIC METABOLIC PANEL
CO2: 21 mEq/L (ref 19–32)
Calcium: 11.4 mg/dL — ABNORMAL HIGH (ref 8.4–10.5)
Creatinine, Ser: 2.46 mg/dL — ABNORMAL HIGH (ref 0.50–1.10)

## 2012-11-13 LAB — GLUCOSE, CAPILLARY: Glucose-Capillary: 238 mg/dL — ABNORMAL HIGH (ref 70–99)

## 2012-11-13 LAB — MAGNESIUM: Magnesium: 2.1 mg/dL (ref 1.5–2.5)

## 2012-11-13 MED ORDER — LIDOCAINE-EPINEPHRINE 0.5 %-1:200000 IJ SOLN
INTRAMUSCULAR | Status: AC
  Filled 2012-11-13: qty 1

## 2012-11-13 MED ORDER — METOPROLOL TARTRATE 1 MG/ML IV SOLN
INTRAVENOUS | Status: AC
  Administered 2012-11-13: 2.5 mg
  Filled 2012-11-13: qty 5

## 2012-11-13 MED ORDER — ETOMIDATE 2 MG/ML IV SOLN
INTRAVENOUS | Status: AC
  Administered 2012-11-13: 10 mg
  Filled 2012-11-13: qty 20

## 2012-11-13 MED ORDER — METOPROLOL TARTRATE 12.5 MG HALF TABLET
12.5000 mg | ORAL_TABLET | Freq: Two times a day (BID) | ORAL | Status: DC
  Administered 2012-11-13 – 2012-11-14 (×4): 12.5 mg via ORAL
  Filled 2012-11-13 (×6): qty 1

## 2012-11-13 MED ORDER — POTASSIUM CHLORIDE 20 MEQ/15ML (10%) PO LIQD
ORAL | Status: AC
  Administered 2012-11-13: 40 meq
  Filled 2012-11-13: qty 30

## 2012-11-13 MED ORDER — MIDAZOLAM HCL 2 MG/2ML IJ SOLN
INTRAMUSCULAR | Status: AC
  Administered 2012-11-13: 2 mg
  Filled 2012-11-13: qty 4

## 2012-11-13 MED ORDER — METOPROLOL TARTRATE 1 MG/ML IV SOLN
2.5000 mg | Freq: Four times a day (QID) | INTRAVENOUS | Status: DC
  Administered 2012-11-13 – 2012-11-16 (×11): 2.5 mg via INTRAVENOUS
  Filled 2012-11-13 (×16): qty 5

## 2012-11-13 MED ORDER — LIDOCAINE HCL (CARDIAC) 20 MG/ML IV SOLN
INTRAVENOUS | Status: AC
  Filled 2012-11-13: qty 5

## 2012-11-13 MED ORDER — ALBUTEROL SULFATE (5 MG/ML) 0.5% IN NEBU
2.5000 mg | INHALATION_SOLUTION | Freq: Four times a day (QID) | RESPIRATORY_TRACT | Status: DC | PRN
  Administered 2012-11-13 – 2012-11-21 (×10): 2.5 mg via RESPIRATORY_TRACT
  Filled 2012-11-13 (×9): qty 0.5

## 2012-11-13 MED ORDER — ROCURONIUM BROMIDE 50 MG/5ML IV SOLN
INTRAVENOUS | Status: AC
  Administered 2012-11-13: 50 mg
  Filled 2012-11-13: qty 2

## 2012-11-13 MED ORDER — CHLORHEXIDINE GLUCONATE CLOTH 2 % EX PADS
6.0000 | MEDICATED_PAD | Freq: Every day | CUTANEOUS | Status: AC
  Administered 2012-11-13 – 2012-11-17 (×5): 6 via TOPICAL

## 2012-11-13 MED ORDER — FENTANYL CITRATE 0.05 MG/ML IJ SOLN
INTRAMUSCULAR | Status: AC
  Administered 2012-11-13: 100 ug
  Filled 2012-11-13: qty 4

## 2012-11-13 MED ORDER — FUROSEMIDE 10 MG/ML IJ SOLN
INTRAMUSCULAR | Status: AC
  Filled 2012-11-13: qty 4

## 2012-11-13 MED ORDER — FUROSEMIDE 8 MG/ML PO SOLN
40.0000 mg | Freq: Every day | ORAL | Status: DC
  Administered 2012-11-13 – 2012-11-15 (×3): 40 mg
  Filled 2012-11-13 (×4): qty 5

## 2012-11-13 MED ORDER — FUROSEMIDE 10 MG/ML IJ SOLN
40.0000 mg | Freq: Once | INTRAMUSCULAR | Status: AC
  Administered 2012-11-13: 40 mg via INTRAVENOUS

## 2012-11-13 MED ORDER — POTASSIUM CHLORIDE 20 MEQ/15ML (10%) PO LIQD
40.0000 meq | ORAL | Status: AC
  Administered 2012-11-13: 40 meq
  Filled 2012-11-13 (×2): qty 30

## 2012-11-13 MED ORDER — HEPARIN SODIUM (PORCINE) 1000 UNIT/ML IJ SOLN
INTRAMUSCULAR | Status: AC
  Filled 2012-11-13: qty 1

## 2012-11-13 MED ORDER — SUCCINYLCHOLINE CHLORIDE 20 MG/ML IJ SOLN
INTRAMUSCULAR | Status: AC
  Filled 2012-11-13: qty 1

## 2012-11-13 MED ORDER — SODIUM CHLORIDE 0.9 % IV SOLN
1.5000 g | Freq: Two times a day (BID) | INTRAVENOUS | Status: DC
  Administered 2012-11-13 – 2012-11-22 (×18): 1.5 g via INTRAVENOUS
  Filled 2012-11-13 (×19): qty 1.5

## 2012-11-13 MED ORDER — PHENYLEPHRINE HCL 10 MG/ML IJ SOLN
30.0000 ug/min | INTRAVENOUS | Status: DC
  Administered 2012-11-13: 90 ug/min via INTRAVENOUS
  Administered 2012-11-14: 40 ug/min via INTRAVENOUS
  Filled 2012-11-13 (×2): qty 4

## 2012-11-13 MED ORDER — MUPIROCIN 2 % EX OINT
1.0000 "application " | TOPICAL_OINTMENT | Freq: Two times a day (BID) | CUTANEOUS | Status: AC
  Administered 2012-11-13 – 2012-11-17 (×10): 1 via NASAL
  Filled 2012-11-13 (×2): qty 22

## 2012-11-13 NOTE — Care Management Note (Addendum)
    Page 1 of 2   12/01/2012     2:31:48 PM   CARE MANAGEMENT NOTE 12/01/2012  Patient:  [name], [name]   Account Number:  [id number]  Date Initiated:  11/13/2012  Documentation initiated by:  Junius Creamer  Subjective/Objective Assessment:   adm w chf, on vent     Action/Plan:   lives w fam, pcp dr Fleet Contras   Anticipated DC Date:     Anticipated DC Plan:  LONG TERM ACUTE CARE (LTAC)  In-house referral  Clinical Social Worker      DC Associate Professor  CM consult      [facility] Choice  LONG TERM ACUTE CARE   Choice offered to / List presented to:             Status of service:  Completed, signed off Medicare Important Message given?   (If response is "NO", the following Medicare IM given date fields will be blank) Date Medicare IM given:   Date Additional Medicare IM given:    Discharge Disposition:  LONG TERM ACUTE CARE (LTAC)  Per UR Regulation:  Reviewed for med. necessity/level of care/duration of stay  If discussed at Long Length of Stay Meetings, dates discussed:   11/16/2012  11/21/2012  11/23/2012  11/28/2012  11/30/2012    Comments:  Contact:  [name] Daughter [phone]   [facility] Daughter [phone]  7/25 1429p [name] rn,bsn still waiting on ins to give [place] ok to send to ltac-ins has been stating pt not stable enough for ltac even though pccm has spoken w ins comp phy. have made sw ref in case stays of vent and ends up needing trach snf. will cont to follow. on trach collar today.  7/22 1111 [name] rn,bsn spoke w [employer]. the fam prefers [facility] for [facility]. have alerted [place] w [facility] md states for peg today and will be ready ltac on 7-23.  11-24-12 2:10pm [name], [name] [phone] Spoke with physician, [name],  expecting may be ready for Ltach beginning of next week.  Does have Ltach benefits, just needs precert per [facility] laisons.  Spoke with daughter [name] about Ltach discharge possibly  next week, ageeable with plan.  Prefers [facility] but will take bed where ever available when her mom is ready.  7/15 1034a [name] rn,bsn spoke w da and [place]. explained ltac vs snf and that cm would help her w dc planning on her mother. poss trach on 7-16. will cont to follow and assist. alerted da to fact that [facility] in [place] for ltac's and would need to start w ins for who has contract. [facility] ck for elidg and req if needed.

## 2012-11-13 NOTE — Procedures (Signed)
Extubation Procedure Note  Patient Details:   Name: [name] DOB: [birth year] MRN: [id number]   Airway Documentation:  Airway 7.5 mm (Active)  Secured at (cm) 23 cm 11/13/2012 11:14 AM  Measured From Lips 11/13/2012 11:14 AM  Secured Location Left 11/13/2012 11:14 AM  Secured By [employer] 11/13/2012 11:14 AM  Tube Holder Repositioned Yes 11/13/2012 11:14 AM  Cuff Pressure (cm H2O) 22 cm H2O 11/13/2012  7:30 AM  Site Condition Dry 11/13/2012  5:00 AM    Evaluation  O2 sats: transiently fell during during procedure Complications: Complications of increasaed WOB, wheezing, SOB desat Patient did not tolerate procedure well. Bilateral Breath Sounds: Expiratory wheezes Suctioning: Airway Yes  Ok Anis, MA 11/13/2012, 12:53 PM

## 2012-11-13 NOTE — Progress Notes (Signed)
SLP Cancellation Note  Patient Details Name: DODIE PARISI MRN: [id number] DOB: 11/01/1940   Cancelled treatment:         Order received for Swallow evaluation following extubation.  Per MD note, pt. Vomited and aspiration of emesis is suspected.  Pt. Was reintubated.  Please re-order swallow evaluation once pt. Is extubated and able to participate.   [name] T 11/13/2012, 1:56 PM

## 2012-11-13 NOTE — Progress Notes (Signed)
Called for respiratory failure, vomitus and very likely aspirated. Will add unasyn.  Alyson Reedy, M.D. [facility] Pulmonary/Critical Care Medicine. Pager: [phone]. After hours pager: [phone].

## 2012-11-13 NOTE — Progress Notes (Signed)
Recruitment maneuver done for desat, pt SAT came up to 96% on recruitment. RT will monitor.

## 2012-11-13 NOTE — Progress Notes (Addendum)
PULMONARY  / CRITICAL CARE MEDICINE  Name: [name] MRN: [id number] DOB: [birth year]    ADMISSION DATE:  11/09/2012 CONSULTATION DATE:  11/09/2012  REFERRING MD :  Preston Fleeting PRIMARY SERVICE: PCCM  CHIEF COMPLAINT:  Shortness of breath  BRIEF PATIENT DESCRIPTION: 72 y/o female with CHF was admitted on 7/3 from the [facility] ED with acute hypoxemic respiratory failure due to a CHF exacerbation, ? Superimposed PNA  SIGNIFICANT EVENTS / STUDIES:  7/3 admission  LINES / TUBES: 7/3 ETT >> 7/4 CVC R IJ >>  7/5 A-line >>  CULTURES: 7/3 respiratory >> 7/4 U strep>>> neg 7/4 legionella>>> neg 7/3 mrsa PCR: POSITIVE  ANTIBIOTICS: 7/3 Ceftriaxone>>>7/6 7/4 levofloxacin>>> 7/4 Vancomycin>>>7/6  SUBJECTIVE:  Follows commands Improved vent settings overnight  VITAL SIGNS: Temp:  [97.4 F (36.3 C)-99.5 F (37.5 C)] 98.2 F (36.8 C) (07/07 0800) Pulse Rate:  [57-85] 72 (07/07 0800) Resp:  [11-25] 17 (07/07 0800) BP: (66-140)/(37-68) 94/45 mmHg (07/07 0800) SpO2:  [95 %-100 %] 99 % (07/07 0800) Arterial Line BP: (73-151)/(41-67) 112/51 mmHg (07/07 0800) FiO2 (%):  [40 %-80 %] 40 % (07/07 0800) Weight:  [79 kg (174 lb 2.6 oz)] 79 kg (174 lb 2.6 oz) (07/07 0500) HEMODYNAMICS: CVP:  [9 mmHg-13 mmHg] 9 mmHg VENTILATOR SETTINGS: Vent Mode:  [-] PCV FiO2 (%):  [40 %-80 %] 40 % Set Rate:  [14 bmp-22 bmp] 14 bmp PEEP:  [5 cmH20-15 cmH20] 5 cmH20 Plateau Pressure:  [17 cmH20-28 cmH20] 17 cmH20 INTAKE / OUTPUT: Intake/Output     07/06 0701 - 07/07 0700 07/07 0701 - 07/08 0700   I.V. (mL/kg) 889.6 (11.3) 50 (0.6)   NG/GT 910 20   IV Piggyback 150    Total Intake(mL/kg) 1949.6 (24.7) 70 (0.9)   Urine (mL/kg/hr) 2055 (1.1) 225 (1.8)   Stool 2 (0)    Total Output 2057 225   Net -107.4 -155          PHYSICAL EXAMINATION:  Gen: sedated on vent,  HEENT: NCAT, PERRL, EOMi, ETT in place PULM: rhonchi in bases, no wheezing,  CV: Tachy, distant heart sounds, no JVD AB: BS+, soft,  nontender, no hsm Ext: warm, trace edema, no clubbing, no cyanosis Derm: no rash or skin breakdown Neuro: Sedated on vent but arouses to voice, follows commands, maew LABS:  Recent Labs Lab 11/11/12 0345 11/12/12 0400 11/13/12 0330  NA 139 139 141  K 4.3 3.7 3.4*  CL 109 110 110  CO2 23 22 21   BUN 37* 47* 60*  CREATININE 2.43* 2.58* 2.46*  GLUCOSE 148* 153* 263*    Recent Labs Lab 11/12/12 0400 11/12/12 1930 11/13/12 0330  HGB 6.8* 7.9* 7.9*  HCT 22.1* 24.3* 24.6*  WBC 10.5 9.3 9.9  PLT 196 178 212    CXR: Improvement in bilateral opacities, persistent basilar atelectasis  ASSESSMENT / PLAN:  PULMONARY A: Acute hypercarbic and hypoxemic respiratory failure in setting of bilateral pulmonary infiltrates. Suspect CHF and decomp edema, but CAP w/ ALI on diff dx. Her response to diuresis supports significant component of edema.  COPD, vent mechanics suggest possible element of AECOPD P:   -Wean vent as tolerated, would progress to SBT and potential extubation if she tolerates -Continue BD -see CV section  -Solumedrol through today, then stop 7/8 -Wean FiO2 as tolerated  CARDIOVASCULAR A:  Acute decompensated CHF HOCM Afib History of noncompliance P:  - Keep CVP <10 per cardiology recommendations - Will resume home metoprolol at half dose to control BP - May  need scheduled lasix to maintain a euvolemic vs negative balance - Appreciate cardiology recommendations  RENAL A:   AKI- likely prerenal in origin (FeNa 0.8%) CKD P:   - Ensure renal perfusion with MAP >60 - monitor UOP  - repeat BMET in AM - replete electrolytes as indicated -concern with lasix with rise crt, will follow trend-->tolerating this now  GASTROINTESTINAL A:  No acute issues P:   - Protonix for SUP - Continue enteral feeds  HEMATOLOGIC A:  Warfarin for A-fib, anemia (diltion) P:  -warfarin per pharmacy consult - Daily INR until therapeutic -  INFECTIOUS A:  Leukocytosis new  infiltrate in RUL (not clear) with elevated procalcitonin, clinically unimpressive infection P:   - Continue levaquin for CAP, will complete 7d - Follow WBC  ENDOCRINE A:  DM2 P:   -ICU hyperglycemia protocol  NEUROLOGIC A:  No focal def  P:   - Wean fentanyl infusion - RASS goal 0   TODAY'S SUMMARY: [name] presented with acute resp failure from either COPD flare or CHF exacerbation. She seems most consistent with CHF exacerbation at this time. With diuresis and PEEP over the previous 24 hours her chest x-ray has improved and her oxygenation is much better. Will control her BP and potentially schedule lasix to keep her net even to net negative. Would wean her vent and see if she can be extubated today.   Benjamin Hocutt S-ACNP  If does well will extubate today after SBT.  CC time 35 min.  Patient seen and examined, agree with above note.  I dictated the care and orders written for this patient under my direction.  Alyson Reedy, MD [phone]

## 2012-11-13 NOTE — Procedures (Signed)
Intubation Procedure Note [name] [id number] 1940-10-19  Procedure: Intubation Indications: Respiratory insufficiency  Procedure Details Consent: Unable to obtain consent because of emergent medical necessity. Time Out: Verified patient identification, verified procedure, site/side was marked, verified correct patient position, special equipment/implants available, medications/allergies/relevent history reviewed, required imaging and test results available.  Performed  Maximum sterile technique was used including antiseptics, gloves, hand hygiene and mask.  MAC    Evaluation Hemodynamic Status: BP stable throughout; O2 sats: stable throughout Patient's Current Condition: stable Complications: No apparent complications Patient did tolerate procedure well. Chest X-ray ordered to verify placement.  CXR: pending.   Koren Bound 11/13/2012

## 2012-11-13 NOTE — Progress Notes (Signed)
PT Cancellation Note  Patient Details Name: [name] MRN: [id number] DOB: 1941-02-10   Cancelled Treatment:    Reason Eval/Treat Not Completed: Patient not medically ready. Pt was re-intubated within the last hour. PT to return as able.   Marcene Brawn 11/13/2012, 2:44 PM

## 2012-11-13 NOTE — Progress Notes (Addendum)
ANTICOAGULATION/ANTIBIOTIC CONSULT NOTE - Follow Up Consult  Pharmacy Consult for Coumadin + Levaquin-->Unasyn Indication: Hx afib + pneumonia (aspiration)  No Known Allergies  Patient Measurements: Height: 5\' 2"  (157.5 cm) Weight: 174 lb 2.6 oz (79 kg) IBW/kg (Calculated) : 50.1  Vital Signs: Temp: 98.2 F (36.8 C) (07/07 0800) Temp src: Core (Comment) (07/07 0800) BP: 97/41 mmHg (07/07 0900) Pulse Rate: 77 (07/07 0900)  Labs:  Recent Labs  11/10/12 1330  11/11/12 0345 11/12/12 0400 11/12/12 1930 11/13/12 0330  HGB  --   < > 7.5* 6.8* 7.9* 7.9*  HCT  --   < > 23.9* 22.1* 24.3* 24.6*  PLT  --   < > 203 196 178 212  LABPROT  --   < > 19.2* 23.0*  --  27.9*  INR  --   < > 1.67* 2.11*  --  2.72*  CREATININE  --   --  2.43* 2.58*  --  2.46*  TROPONINI <0.30  --   --   --   --   --   < > = values in this interval not displayed.  Estimated Creatinine Clearance: 20.1 ml/min (by C-G formula based on Cr of 2.46).  Assessment: 72yof continues on coumadin for hx afib. INR is therapeutic, however, has increased significantly despite no coumadin given last night (due to drop in Hgb). Significant increase is probably due to drug interaction with levaquin. Also on amiodarone as pta. Hgb/Hct are improved today s/p 1 unit of PRBC yesterday, platelets remain stable. No bleeding reported.  Also continues on day # 4 levaquin (CTX/Vanc d/c'ed) for pneumonia. SCr is elevated but stable (hx CKD). Dose is appropriate for CrCl of 32ml/min. She is afebrile, WBC wnl, and CXR shows interval improvement.  CTX 7/3 >>7/6 LVQ 7/4 >> Vanc 7/4 >>7/6  7/4 BCx >> ngtd 7/3 UCx >> negative final  Goal of Therapy:  INR 2-3 Monitor platelets by anticoagulation protocol: Yes Appropriate levaquin dosing   Plan:  1) Hold coumadin again tonight - if INR decreases 7/8 can re-dose 2) Continue levaquin 750mg  IV q48 3) Follow up INR, CBC, BMET in AM  Fredrik Rigger 11/13/2012,9:49 AM  Patient  developed respiratory distress after extubation this afternoon along with vomiting. She is now re-intubated. Will change levaquin to unasyn for probable aspiration.  Plan: 1) Unasyn 1.5g IV q12  Fredrik Rigger 11/13/2012, 1:38 PM

## 2012-11-13 NOTE — Progress Notes (Signed)
SUBJECTIVE:  Intubated.  Awake. Denies pain.   PHYSICAL EXAM Filed Vitals:   11/13/12 0300 11/13/12 0400 11/13/12 0500 11/13/12 0600  BP: 140/64 105/52 100/53 115/56  Pulse: 85 66 60 70  Temp:  98.1 F (36.7 C)    TempSrc:  Core (Comment)    Resp: 11 22 25 21   Height:      Weight:   174 lb 2.6 oz (79 kg)   SpO2: 100% 100% 100% 100%   General:  No acute distress Lungs:  No wheezing, no crackles Heart:  RRR, systolic murmur Abdomen:  Positive bowel sounds, no rebound no guarding Extremities:  No edema  LABS:  Results for orders placed during the hospital encounter of 11/09/12 (from the past 24 hour(s))  GLUCOSE, CAPILLARY     Status: Abnormal   Collection Time    11/12/12  7:53 AM      Result Value Range   Glucose-Capillary 132 (*) 70 - 99 mg/dL  POCT I-STAT 3, BLOOD GAS (G3+)     Status: Abnormal   Collection Time    11/12/12  9:43 AM      Result Value Range   pH, Arterial 7.226 (*) 7.350 - 7.450   pCO2 arterial 50.4 (*) 35.0 - 45.0 mmHg   pO2, Arterial 61.0 (*) 80.0 - 100.0 mmHg   Bicarbonate 20.8  20.0 - 24.0 mEq/L   TCO2 22  0 - 100 mmol/L   O2 Saturation 85.0     Acid-base deficit 6.0 (*) 0.0 - 2.0 mmol/L   Patient temperature 99.5 F     Sample type ARTERIAL    GLUCOSE, CAPILLARY     Status: Abnormal   Collection Time    11/12/12 11:45 AM      Result Value Range   Glucose-Capillary 158 (*) 70 - 99 mg/dL  POCT I-STAT 3, BLOOD GAS (G3+)     Status: Abnormal   Collection Time    11/12/12  2:38 PM      Result Value Range   pH, Arterial 7.279 (*) 7.350 - 7.450   pCO2 arterial 44.9  35.0 - 45.0 mmHg   pO2, Arterial 121.0 (*) 80.0 - 100.0 mmHg   Bicarbonate 20.9  20.0 - 24.0 mEq/L   TCO2 22  0 - 100 mmol/L   O2 Saturation 98.0     Acid-base deficit 5.0 (*) 0.0 - 2.0 mmol/L   Patient temperature 99.4 F     Sample type ARTERIAL    GLUCOSE, CAPILLARY     Status: Abnormal   Collection Time    11/12/12  4:37 PM      Result Value Range   Glucose-Capillary  217 (*) 70 - 99 mg/dL  CBC     Status: Abnormal   Collection Time    11/12/12  7:30 PM      Result Value Range   WBC 9.3  4.0 - 10.5 K/uL   RBC 2.98 (*) 3.87 - 5.11 MIL/uL   Hemoglobin 7.9 (*) 12.0 - 15.0 g/dL   HCT 40.9 (*) 81.1 - 91.4 %   MCV 81.5  78.0 - 100.0 fL   MCH 26.5  26.0 - 34.0 pg   MCHC 32.5  30.0 - 36.0 g/dL   RDW 78.2 (*) 95.6 - 21.3 %   Platelets 178  150 - 400 K/uL  GLUCOSE, CAPILLARY     Status: Abnormal   Collection Time    11/12/12  7:35 PM      Result Value Range  Glucose-Capillary 232 (*) 70 - 99 mg/dL  GLUCOSE, CAPILLARY     Status: Abnormal   Collection Time    11/12/12 11:22 PM      Result Value Range   Glucose-Capillary 174 (*) 70 - 99 mg/dL  GLUCOSE, CAPILLARY     Status: Abnormal   Collection Time    11/13/12  3:23 AM      Result Value Range   Glucose-Capillary 245 (*) 70 - 99 mg/dL  PROTIME-INR     Status: Abnormal   Collection Time    11/13/12  3:30 AM      Result Value Range   Prothrombin Time 27.9 (*) 11.6 - 15.2 seconds   INR 2.72 (*) 0.00 - 1.49  CBC     Status: Abnormal   Collection Time    11/13/12  3:30 AM      Result Value Range   WBC 9.9  4.0 - 10.5 K/uL   RBC 3.05 (*) 3.87 - 5.11 MIL/uL   Hemoglobin 7.9 (*) 12.0 - 15.0 g/dL   HCT 40.1 (*) 02.7 - 25.3 %   MCV 80.7  78.0 - 100.0 fL   MCH 25.9 (*) 26.0 - 34.0 pg   MCHC 32.1  30.0 - 36.0 g/dL   RDW 66.4 (*) 40.3 - 47.4 %   Platelets 212  150 - 400 K/uL  BASIC METABOLIC PANEL     Status: Abnormal   Collection Time    11/13/12  3:30 AM      Result Value Range   Sodium 141  135 - 145 mEq/L   Potassium 3.4 (*) 3.5 - 5.1 mEq/L   Chloride 110  96 - 112 mEq/L   CO2 21  19 - 32 mEq/L   Glucose, Bld 263 (*) 70 - 99 mg/dL   BUN 60 (*) 6 - 23 mg/dL   Creatinine, Ser 2.59 (*) 0.50 - 1.10 mg/dL   Calcium 56.3 (*) 8.4 - 10.5 mg/dL   GFR calc non Af Amer 18 (*) >90 mL/min   GFR calc Af Amer 21 (*) >90 mL/min  MAGNESIUM     Status: None   Collection Time    11/13/12  3:30 AM       Result Value Range   Magnesium 2.1  1.5 - 2.5 mg/dL  POCT I-STAT 3, BLOOD GAS (G3+)     Status: Abnormal   Collection Time    11/13/12  3:31 AM      Result Value Range   pH, Arterial 7.278 (*) 7.350 - 7.450   pCO2 arterial 44.3  35.0 - 45.0 mmHg   pO2, Arterial 171.0 (*) 80.0 - 100.0 mmHg   Bicarbonate 20.7  20.0 - 24.0 mEq/L   TCO2 22  0 - 100 mmol/L   O2 Saturation 99.0     Acid-base deficit 6.0 (*) 0.0 - 2.0 mmol/L   Patient temperature 36.9 C     Collection site ARTERIAL LINE     Drawn by Nurse     Sample type ARTERIAL      Intake/Output Summary (Last 24 hours) at 11/13/12 8756 Last data filed at 11/13/12 0500  Gross per 24 hour  Intake 1887.08 ml  Output   1755 ml  Net 132.08 ml    ASSESSMENT AND PLAN:  Acute on chronic diastolic congestive heart failure:  Seems to usually be preceded by hypertensive urgency.  Reason for acute exacerbation has not been clear as she reports compliance with diet and meds.  CVP is 10.  Lasix to keep CVP less than ten.  CXR pending today.   CHRONIC KIDNEY DISEASE STAGE V:  Creat is stable.   FIBRILLATION, ATRIAL:  Maintaining NSR, telemetry reviewed.  On warfarin per pharmacy.  Continue amiodarone.    Acute respiratory failure with hypoxia:  Plan per CCM.       Rollene Rotunda 11/13/2012 6:33 AM

## 2012-11-14 ENCOUNTER — Inpatient Hospital Stay (HOSPITAL_COMMUNITY): Payer: Medicare Other

## 2012-11-14 LAB — BLOOD GAS, ARTERIAL
Acid-base deficit: 4.5 mmol/L — ABNORMAL HIGH (ref 0.0–2.0)
Bicarbonate: 20.3 mEq/L (ref 20.0–24.0)
FIO2: 0.6 %
O2 Saturation: 99.5 %
PEEP: 14 cmH2O
Patient temperature: 99
RATE: 20 resp/min

## 2012-11-14 LAB — PROCALCITONIN: Procalcitonin: 3.42 ng/mL

## 2012-11-14 LAB — BASIC METABOLIC PANEL
CO2: 21 mEq/L (ref 19–32)
Calcium: 12.4 mg/dL — ABNORMAL HIGH (ref 8.4–10.5)
Chloride: 114 mEq/L — ABNORMAL HIGH (ref 96–112)
Sodium: 147 mEq/L — ABNORMAL HIGH (ref 135–145)

## 2012-11-14 LAB — GLUCOSE, CAPILLARY
Glucose-Capillary: 193 mg/dL — ABNORMAL HIGH (ref 70–99)
Glucose-Capillary: 208 mg/dL — ABNORMAL HIGH (ref 70–99)
Glucose-Capillary: 156 mg/dL — ABNORMAL HIGH (ref 70–99)

## 2012-11-14 LAB — CBC
MCH: 26 pg (ref 26.0–34.0)
Platelets: 297 10*3/uL (ref 150–400)
RBC: 3.27 MIL/uL — ABNORMAL LOW (ref 3.87–5.11)
WBC: 17.4 10*3/uL — ABNORMAL HIGH (ref 4.0–10.5)

## 2012-11-14 LAB — PROTIME-INR: Prothrombin Time: 27.7 seconds — ABNORMAL HIGH (ref 11.6–15.2)

## 2012-11-14 LAB — MAGNESIUM: Magnesium: 2.3 mg/dL (ref 1.5–2.5)

## 2012-11-14 MED ORDER — PANTOPRAZOLE SODIUM 40 MG PO PACK
40.0000 mg | PACK | Freq: Every day | ORAL | Status: DC
  Administered 2012-11-14 – 2012-11-21 (×8): 40 mg
  Filled 2012-11-14 (×12): qty 20

## 2012-11-14 MED ORDER — WARFARIN SODIUM 2.5 MG PO TABS
2.5000 mg | ORAL_TABLET | Freq: Once | ORAL | Status: AC
  Administered 2012-11-14: 2.5 mg
  Filled 2012-11-14: qty 1

## 2012-11-14 MED ORDER — VANCOMYCIN HCL IN DEXTROSE 1-5 GM/200ML-% IV SOLN
1000.0000 mg | INTRAVENOUS | Status: DC
  Administered 2012-11-14 – 2012-11-16 (×3): 1000 mg via INTRAVENOUS
  Filled 2012-11-14 (×4): qty 200

## 2012-11-14 MED ORDER — FUROSEMIDE 10 MG/ML IJ SOLN
40.0000 mg | Freq: Once | INTRAMUSCULAR | Status: AC
  Administered 2012-11-14: 40 mg via INTRAVENOUS

## 2012-11-14 NOTE — Progress Notes (Signed)
Pt's rhythm change confirmed by EKG - a flutter; MD made aware; will continue to monitor closely and update as needed

## 2012-11-14 NOTE — Progress Notes (Signed)
MD called about restarting tube feedings; ordered to start tube feedings at starting rate;

## 2012-11-14 NOTE — Progress Notes (Signed)
Inpatient Diabetes Program Recommendations  AACE/ADA: New Consensus Statement on Inpatient Glycemic Control (2013)  Target Ranges:  Prepandial:   less than 140 mg/dL      Peak postprandial:   less than 180 mg/dL (1-2 hours)      Critically ill patients:  140 - 180 mg/dL  Results for LLESENIA, FOGAL (MRN [id number]) as of 11/14/2012 15:50  Ref. Range 11/13/2012 07:37 11/13/2012 12:08 11/13/2012 16:18 11/13/2012 20:10 11/13/2012 23:49 11/14/2012 03:33 11/14/2012 08:13 11/14/2012 11:52  Glucose-Capillary Latest Range: 70-99 mg/dL 629 (H) 528 (H) 413 (H) 172 (H) 178 (H) 193 (H) 191 (H) 208 (H)    Inpatient Diabetes Program Recommendations Insulin - Basal: consider starting Lantus 10 units during steroid therapy Discontinue ICU Hyperglycemia Protocol if not following Thank you  Piedad Climes BSN, RN,CDE Inpatient Diabetes Coordinator [phone] (team pager)

## 2012-11-14 NOTE — Progress Notes (Signed)
PT Cancellation Note  Patient Details Name: [name] MRN: [id number] DOB: 1940/07/10   Cancelled Treatment:    Reason Eval/Treat Not Completed: Patient not medically ready. Pt not appropriate for PT at this time. Please re-consult when pt appropriate.   Marcene Brawn 11/14/2012, 12:03 PM

## 2012-11-14 NOTE — Procedures (Signed)
Mini BAL Procedure Note [name] [id number] [birth year]  Procedure: Mini Bronchial Alveolar Lavage  Procedure Details: In preparation for procedure, Patient hyper-oxygenated with 100 % FiO2 Sterile Technique used:gloves, gown and mask Amount of Saline administered: 60 (ml) Specimen amount collected: 5 (ml)  Evaluation: BP 137/83  Pulse 102  Temp(Src) 99.3 F (37.4 C) (Core (Comment))  Resp 21  Ht 5\' 2"  (1.575 m)  Wt 178 lb 12.7 oz (81.1 kg)  BMI 32.69 kg/m2  SpO2 99% O2 sats: stable throughout Breath Sounds: Rhonch Patient's Current Condition: stable Complications: No apparent complications Patient did tolerate procedure well.   Ok Anis, MA 11/14/2012, 10:22 AM

## 2012-11-14 NOTE — Progress Notes (Signed)
NUTRITION FOLLOW UP  Intervention:   1.  Enteral nutrition; recommend re-initiation of Osmolite 1.5 @ 20 mL/hr continuous with Prostat 5 times daily to provide 1220 kcal, 105g protein, and 366 mL free water.  If aspiration risk increased, pt would benefit from post-pyloric tube placement.   Nutrition Dx:   Inadequate oral intake, ongoing  Monitor:   1.  Enteral nutrition; .Enteral nutrition to provide 60-70% of estimated calorie needs (22-25 kcals/kg ideal body weight) and 100% of estimated protein needs, based on ASPEN guidelines for permissive underfeeding in critically ill obese individuals. 2.  Wt/wt change; monitor trends  Assessment:   Pt admitted with respiratory failure due to CHF exacerbation. Pt was extubated yesterday, but re-intubated after episode of vomiting.  Patient is currently intubated on ventilator support.  MV: 10 L/min Temp:Temp (24hrs), Avg:99.4 F (37.4 C), Min:98.6 F (37 C), Max:100.2 F (37.9 C)  Recommend resume of TF if appropriate.  Pt tolerated TFs well prior to extubation.  Height: Ht Readings from Last 1 Encounters:  11/09/12 5\' 2"  (1.575 m)    Weight Status:   Wt Readings from Last 1 Encounters:  11/14/12 178 lb 12.7 oz (81.1 kg)    Re-estimated needs:  Kcal: 1649 Permissive underfeeding goals:  1150-1250 kcal/d  Protein: >/=100g Fluid: ~1.8 L/day  Skin: intact, non-pitting edema  Diet Order: NPO   Intake/Output Summary (Last 24 hours) at 11/14/12 1500 Last data filed at 11/14/12 1300  Gross per 24 hour  Intake 1458.63 ml  Output   3540 ml  Net -2081.37 ml    Last BM: 7/7   Labs:   Recent Labs Lab 11/10/12 0700  11/12/12 0400 11/13/12 0330 11/14/12 0505  NA 139  < > 139 141 147*  K 3.9  < > 3.7 3.4* 4.0  CL 107  < > 110 110 114*  CO2 22  < > 22 21 21   BUN 22  < > 47* 60* 57*  CREATININE 2.33*  < > 2.58* 2.46* 2.35*  CALCIUM 10.4  < > 10.5 11.4* 12.4*  MG 1.9  < > 1.9 2.1 2.3  PHOS 4.5  --   --   --  3.2   GLUCOSE 110*  < > 153* 263* 227*  < > = values in this interval not displayed.  CBG (last 3)   Recent Labs  11/14/12 0333 11/14/12 0813 11/14/12 1152  GLUCAP 193* 191* 208*    Scheduled Meds: . amiodarone  200 mg Oral Daily  . ampicillin-sulbactam (UNASYN) IV  1.5 g Intravenous Q12H  . antiseptic oral rinse  15 mL Mouth Rinse BID  . atorvastatin  20 mg Oral q1800  . chlorhexidine  15 mL Mouth/Throat BID  . Chlorhexidine Gluconate Cloth  6 each Topical Q0600  . docusate  100 mg Per Tube BID  . feeding supplement (OSMOLITE 1.5 CAL)  1,000 mL Per Tube Q24H  . feeding supplement  30 mL Per Tube 5 X Daily  . furosemide  40 mg Per Tube Daily  . insulin aspart  1-3 Units Subcutaneous Q4H  . ipratropium  0.5 mg Nebulization Q6H  . methylPREDNISolone (SOLU-MEDROL) injection  40 mg Intravenous Q12H  . metoprolol  2.5 mg Intravenous Q6H  . metoprolol tartrate  12.5 mg Oral BID  . multivitamin  5 mL Per Tube Daily  . mupirocin ointment  1 application Nasal BID  . pantoprazole sodium  40 mg Per Tube QHS  . polyethylene glycol  17 g Per Tube BID  .  sodium chloride  3 mL Intravenous Q12H  . vancomycin  1,000 mg Intravenous Q24H  . warfarin  2.5 mg Per Tube ONCE-1800  . Warfarin - Pharmacist Dosing Inpatient   Does not apply q1800    Continuous Infusions: . fentaNYL infusion INTRAVENOUS 150 mcg/hr (11/14/12 1300)  . phenylephrine (NEO-SYNEPHRINE) Adult infusion 70 mcg/min (11/14/12 1031)    Loyce Dys, MS RD LDN Clinical Inpatient Dietitian Pager: [phone] Weekend/After hours pager: [phone]

## 2012-11-14 NOTE — Progress Notes (Signed)
SUBJECTIVE:  Extubated and reintubated yesterday.  Awake. Denies pain.   PHYSICAL EXAM Filed Vitals:   11/14/12 0615 11/14/12 0700 11/14/12 0715 11/14/12 0719  BP: 133/91   137/83  Pulse: 99 99  102  Temp:      TempSrc:      Resp: 20 20  20   Height:      Weight:      SpO2: 100% 100% 100% 100%   General:  No acute distress Lungs:  No wheezing, no crackles Heart:  RRR, systolic murmur Abdomen:  Positive bowel sounds, no rebound no guarding Extremities:  No edema  LABS:  Results for orders placed during the hospital encounter of 11/09/12 (from the past 24 hour(s))  GLUCOSE, CAPILLARY     Status: Abnormal   Collection Time    11/13/12 12:08 PM      Result Value Range   Glucose-Capillary 220 (*) 70 - 99 mg/dL  POCT I-STAT 3, BLOOD GAS (G3+)     Status: Abnormal   Collection Time    11/13/12  2:27 PM      Result Value Range   pH, Arterial 7.189 (*) 7.350 - 7.450   pCO2 arterial 53.4 (*) 35.0 - 45.0 mmHg   pO2, Arterial 64.0 (*) 80.0 - 100.0 mmHg   Bicarbonate 20.3  20.0 - 24.0 mEq/L   TCO2 22  0 - 100 mmol/L   O2 Saturation 85.0     Acid-base deficit 8.0 (*) 0.0 - 2.0 mmol/L   Patient temperature 37.4 C     Sample type ARTERIAL     Comment NOTIFIED PHYSICIAN    GLUCOSE, CAPILLARY     Status: Abnormal   Collection Time    11/13/12  4:18 PM      Result Value Range   Glucose-Capillary 238 (*) 70 - 99 mg/dL  POCT I-STAT 3, BLOOD GAS (G3+)     Status: Abnormal   Collection Time    11/13/12  4:37 PM      Result Value Range   pH, Arterial 7.236 (*) 7.350 - 7.450   pCO2 arterial 48.2 (*) 35.0 - 45.0 mmHg   pO2, Arterial 56.0 (*) 80.0 - 100.0 mmHg   Bicarbonate 20.4  20.0 - 24.0 mEq/L   TCO2 22  0 - 100 mmol/L   O2 Saturation 82.0     Acid-base deficit 7.0 (*) 0.0 - 2.0 mmol/L   Patient temperature 37.2 C     Collection site ARTERIAL LINE     Drawn by Operator     Sample type ARTERIAL    GLUCOSE, CAPILLARY     Status: Abnormal   Collection Time    11/13/12  8:10  PM      Result Value Range   Glucose-Capillary 172 (*) 70 - 99 mg/dL  GLUCOSE, CAPILLARY     Status: Abnormal   Collection Time    11/13/12 11:49 PM      Result Value Range   Glucose-Capillary 178 (*) 70 - 99 mg/dL  GLUCOSE, CAPILLARY     Status: Abnormal   Collection Time    11/14/12  3:33 AM      Result Value Range   Glucose-Capillary 193 (*) 70 - 99 mg/dL  PROTIME-INR     Status: Abnormal   Collection Time    11/14/12  5:05 AM      Result Value Range   Prothrombin Time 27.7 (*) 11.6 - 15.2 seconds   INR 2.69 (*) 0.00 - 1.49  CBC  Status: Abnormal   Collection Time    11/14/12  5:05 AM      Result Value Range   WBC 17.4 (*) 4.0 - 10.5 K/uL   RBC 3.27 (*) 3.87 - 5.11 MIL/uL   Hemoglobin 8.5 (*) 12.0 - 15.0 g/dL   HCT 69.6 (*) 29.5 - 28.4 %   MCV 81.3  78.0 - 100.0 fL   MCH 26.0  26.0 - 34.0 pg   MCHC 32.0  30.0 - 36.0 g/dL   RDW 13.2 (*) 44.0 - 10.2 %   Platelets 297  150 - 400 K/uL  BASIC METABOLIC PANEL     Status: Abnormal   Collection Time    11/14/12  5:05 AM      Result Value Range   Sodium 147 (*) 135 - 145 mEq/L   Potassium 4.0  3.5 - 5.1 mEq/L   Chloride 114 (*) 96 - 112 mEq/L   CO2 21  19 - 32 mEq/L   Glucose, Bld 227 (*) 70 - 99 mg/dL   BUN 57 (*) 6 - 23 mg/dL   Creatinine, Ser 7.25 (*) 0.50 - 1.10 mg/dL   Calcium 36.6 (*) 8.4 - 10.5 mg/dL   GFR calc non Af Amer 20 (*) >90 mL/min   GFR calc Af Amer 23 (*) >90 mL/min  MAGNESIUM     Status: None   Collection Time    11/14/12  5:05 AM      Result Value Range   Magnesium 2.3  1.5 - 2.5 mg/dL  PHOSPHORUS     Status: None   Collection Time    11/14/12  5:05 AM      Result Value Range   Phosphorus 3.2  2.3 - 4.6 mg/dL  BLOOD GAS, ARTERIAL     Status: Abnormal   Collection Time    11/14/12  5:05 AM      Result Value Range   FIO2 0.60     Delivery systems VENTILATOR     Mode PRESSURE REGULATED VOLUME CONTROL     VT 500     Rate 20     Peep/cpap 14.0     pH, Arterial 7.338 (*) 7.350 - 7.450    pCO2 arterial 38.9  35.0 - 45.0 mmHg   pO2, Arterial 153.0 (*) 80.0 - 100.0 mmHg   Bicarbonate 20.3  20.0 - 24.0 mEq/L   TCO2 21.4  0 - 100 mmol/L   Acid-base deficit 4.5 (*) 0.0 - 2.0 mmol/L   O2 Saturation 99.5     Patient temperature 99.0     Collection site A-LINE     Drawn by [id number]     Sample type ARTERIAL     Allens test (pass/fail) NOT INDICATED (*) PASS    Intake/Output Summary (Last 24 hours) at 11/14/12 0829 Last data filed at 11/14/12 0800  Gross per 24 hour  Intake 1665.39 ml  Output   3250 ml  Net -1584.61 ml    ASSESSMENT AND PLAN:  Acute on chronic diastolic congestive heart failure:  CVP 15.    Lasix to keep CVP less than ten.  CXR with continued vascular congestion but improving edema.  Agree with current Lasix via tube.  If CVP still up after AM  Lasix I would suggest a PM dose.  CHRONIC KIDNEY DISEASE STAGE V:  Creat is stable.   FIBRILLATION, ATRIAL:  Maintaining NSR, telemetry reviewed.  On warfarin per pharmacy.  Continue amiodarone.    Acute respiratory failure with hypoxia:  Plan  per CCM.      Fayrene Fearing [facility] 11/14/2012 8:29 AM

## 2012-11-14 NOTE — Progress Notes (Signed)
PULMONARY  / CRITICAL CARE MEDICINE  Name: [name] MRN: [id number] DOB: 18-Mar-1941    ADMISSION DATE:  11/09/2012 CONSULTATION DATE:  11/09/2012  REFERRING MD :  Preston Fleeting PRIMARY SERVICE: PCCM  CHIEF COMPLAINT:  Shortness of breath  BRIEF PATIENT DESCRIPTION: 72 y/o female with CHF was admitted on 7/3 from the [facility] ED with acute hypoxemic respiratory failure due to a CHF exacerbation, ? Superimposed PNA  SIGNIFICANT EVENTS / STUDIES:  7/3 admission  LINES / TUBES: 7/3 ETT >>11/13/12, 11/13/12  (failed extubation immediately, ? aspirated) >> 7/4 CVC R IJ >>  7/5 A-line >>  CULTURES: 7/3 respiratory >> 7/4 U strep>>> neg 7/4 legionella>>> neg 7/3 mrsa PCR: POSITIVE    ANTIBIOTICS: 7/3 Ceftriaxone>>>7/6 7/4 levofloxacin>>>7/7 7/4 Vancomycin>>>7/6, 7/8 Vanc 7/7 Unasyn  SUBJECTIVE:   11/14/2012: She got reintubated yesterday. Suspected aspiration. Currently on Neo-Synephrine. FiO2 50% and PEEP of 14   VITAL SIGNS: Temp:  [98.6 F (37 C)-100.2 F (37.9 C)] 99.7 F (37.6 C) (07/08 0400) Pulse Rate:  [77-128] 102 (07/08 0719) Resp:  [14-30] 20 (07/08 0719) BP: (66-198)/(41-122) 137/83 mmHg (07/08 0719) SpO2:  [86 %-100 %] 100 % (07/08 0719) Arterial Line BP: (53-159)/(46-117) 127/75 mmHg (07/08 0700) FiO2 (%):  [40 %-100 %] 50 % (07/08 0719) Weight:  [81.1 kg (178 lb 12.7 oz)] 81.1 kg (178 lb 12.7 oz) (07/08 0500) HEMODYNAMICS: CVP:  [10 mmHg-15 mmHg] 15 mmHg VENTILATOR SETTINGS: Vent Mode:  [-] PRVC FiO2 (%):  [40 %-100 %] 50 % Set Rate:  [16 bmp-24 bmp] 20 bmp Vt Set:  [500 mL] 500 mL PEEP:  [5 cmH20-14 cmH20] 14 cmH20 Pressure Support:  [5 cmH20] 5 cmH20 Plateau Pressure:  [25 cmH20-28 cmH20] 25 cmH20 INTAKE / OUTPUT: Intake/Output     07/07 0701 - 07/08 0700 07/08 0701 - 07/09 0700   I.V. (mL/kg) 1076.6 (13.3) 58.8 (0.7)   NG/GT 490    IV Piggyback 100    Total Intake(mL/kg) 1666.6 (20.6) 58.8 (0.7)   Urine (mL/kg/hr) 2825 (1.5) 250 (1.7)   Emesis/NG  output 400 (0.2)    Stool     Total Output 3225 250   Net -1558.4 -191.2        Stool Occurrence 1 x      PHYSICAL EXAMINATION:  Gen: sedated on vent,  HEENT: NCAT, PERRL, EOMi, ETT in place PULM: rhonchi in bases, no wheezing,  CV: Tachy, distant heart sounds, no JVD AB: BS+, soft, nontender, no hsm Ext: warm, trace edema, no clubbing, no cyanosis Derm: no rash or skin breakdown Neuro: Sedated on vent but arouses to voice, follows commands, maew this is on fentanyl infusion LABS:  Recent Labs Lab 11/12/12 0400 11/13/12 0330 11/14/12 0505  NA 139 141 147*  K 3.7 3.4* 4.0  CL 110 110 114*  CO2 22 21 21   BUN 47* 60* 57*  CREATININE 2.58* 2.46* 2.35*  GLUCOSE 153* 263* 227*    Recent Labs Lab 11/12/12 1930 11/13/12 0330 11/14/12 0505  HGB 7.9* 7.9* 8.5*  HCT 24.3* 24.6* 26.6*  WBC 9.3 9.9 17.4*  PLT 178 212 297   IMAGING x 48h Dg Chest Port 1 View  11/14/2012   *RADIOLOGY REPORT*  Clinical Data: Shortness of breath.  PORTABLE CHEST - 1 VIEW  Comparison: 11/13/2012  Findings: Endotracheal tube tip measures about 3.6 cm above the carina.  Enteric tube tip is out of the field of view but below the left hemidiaphragm.  Right central venous catheter tip in the cavoatrial junction.  Cardiac enlargement with mild pulmonary vascular congestion and perihilar edema.  Edema pattern appears slightly improved.  Probable right pleural effusion.  IMPRESSION: Appliances appear in satisfactory position.  Cardiac enlargement with pulmonary vascular congestion and improving edema.  Right pleural effusion.   Original Report Authenticated By: Burman Nieves, M.D.   Dg Chest Port 1 View  11/13/2012   *RADIOLOGY REPORT*  Clinical Data: ET tube placement.  PORTABLE CHEST - 1 VIEW  Comparison: Chest x-ray from earlier the same date.  Findings: Endotracheal tube terminates in the mid thoracic trachea. Gastric suction tube crosses the diaphragm.  Right IJ central venous catheter at the superior  caval atrial junction.  No cardiomegaly.  Worsening diffuse  interstitial coarsening with small bilateral pleural effusions.  No asymmetric opacity.  No pneumothorax.  IMPRESSION: 1.  Endotracheal tube ends in the mid thoracic trachea. 2.  Worsening pulmonary edema compared to earlier today.   Original Report Authenticated By: Tiburcio Pea   Dg Chest Port 1 View  11/13/2012   *RADIOLOGY REPORT*  Clinical Data: Worsening pulmonary edema.  PORTABLE CHEST - 1 VIEW  Comparison: 11/12/2012  Findings: 0646 hours.  Endotracheal tube tip is 3.3 cm above the base of the carina. The NG tube passes into the stomach although the distal tip position is not included on the film.  Slight interval improvement and interstitial pulmonary edema.  Persistent basilar atelectasis with small bilateral pleural effusions noted right IJ central line tip overlies the mid SVC level. Telemetry leads overlie the chest.  IMPRESSION: Interval improvement and interstitial pulmonary edema.  Otherwise no substantial change.   Original Report Authenticated By: Kennith Center, M.D.   Dg Abd Portable 1v  11/13/2012   *RADIOLOGY REPORT*  Clinical Data: OG tube placement  PORTABLE ABDOMEN - 1 VIEW  Comparison: None  Findings: Enteric tube tip is in the stomach.  The side port is below the GE junction. Bowel gas pattern appears well within normal limits.  IMPRESSION:  1.  Enteric tube placement with tip in the stomach.   Original Report Authenticated By: Signa Kell, M.D.     ASSESSMENT / PLAN:  PULMONARY  Recent Labs Lab 11/12/12 1438 11/13/12 0331 11/13/12 1427 11/13/12 1637 11/14/12 0505  PHART 7.279* 7.278* 7.189* 7.236* 7.338*  PCO2ART 44.9 44.3 53.4* 48.2* 38.9  PO2ART 121.0* 171.0* 64.0* 56.0* 153.0*  HCO3 20.9 20.7 20.3 20.4 20.3  TCO2 22 22 22 22  21.4  O2SAT 98.0 99.0 85.0 82.0 99.5    A: Acute hypercarbic and hypoxemic respiratory failure in setting of bilateral pulmonary infiltrates. Suspect CHF and decomp edema, but  CAP w/ ALI on diff dx. Her response to diuresis supports significant component of edema.  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 Consider prednisone for July 9 -Wean FiO2 as tolerated  CARDIOVASCULAR  Recent Labs Lab 11/09/12 1827 11/10/12 0030  PROBNP 1423.0* 2952.0*     Recent Labs Lab 11/10/12 0030 11/10/12 0700 11/10/12 1330  TROPONINI <0.30 <0.30 <0.30     A:  Acute decompensated CHF HOCM Afib History of noncompliance  - November 14, 2012: Cardiology diuresing P:  - Keep CVP <10 per cardiology recommendations - Will resume home metoprolol at half dose to control BP - May need scheduled lasix to maintain a euvolemic vs negative balance -  Appreciate cardiology recommendations  RENAL  Recent Labs Lab 11/10/12 0700 11/11/12 0345 11/12/12 0400 11/13/12 0330 11/14/12 0505  NA 139 139 139 141 147*  K 3.9 4.3 3.7 3.4* 4.0  CL 107 109 110 110 114*  CO2 22 23 22 21 21   GLUCOSE 110* 148* 153* 263* 227*  BUN 22 37* 47* 60* 57*  CREATININE 2.33* 2.43* 2.58* 2.46* 2.35*  CALCIUM 10.4 10.8* 10.5 11.4* 12.4*  MG 1.9 1.8 1.9 2.1 2.3  PHOS 4.5  --   --   --  3.2   Intake/Output     07/07 0701 - 07/08 0700 07/08 0701 - 07/09 0700   I.V. (mL/kg) 1076.6 (13.3) 58.8 (0.7)   NG/GT 490    IV Piggyback 100    Total Intake(mL/kg) 1666.6 (20.6) 58.8 (0.7)   Urine (mL/kg/hr) 2825 (1.5) 250 (1.7)   Emesis/NG output 400 (0.2)    Stool     Total Output 3225 250   Net -1558.4 -191.2        Stool Occurrence 1 x      A:   AKI- likely prerenal in origin (FeNa 0.8%) CKD P:   - Ensure renal perfusion with MAP >60 - monitor UOP  - repeat BMET in AM - replete electrolytes as indicated -concern with lasix with rise crt, will follow  trend-->tolerating this now  GASTROINTESTINAL  Recent Labs Lab 11/09/12 1828  11/10/12 1800 11/11/12 0345 11/12/12 0400 11/13/12 0330 11/14/12 0505  AST 26  --   --   --   --   --   --   ALT 15  --   --   --   --   --   --   ALKPHOS 195*  --   --   --   --   --   --   BILITOT 0.4  --   --   --   --   --   --   PROT 7.5  --   --   --   --   --   --   ALBUMIN 3.3*  --   --   --   --   --   --   INR  --   < > 1.55* 1.67* 2.11* 2.72* 2.69*  < > = values in this interval not displayed.  A:  No acute issues P:   - Protonix for SUP - Continue enteral feeds  HEMATOLOGIC  Recent Labs Lab 11/12/12 1930 11/13/12 0330 11/14/12 0505  HGB 7.9* 7.9* 8.5*  HCT 24.3* 24.6* 26.6*  WBC 9.3 9.9 17.4*  PLT 178 212 297    Recent Labs Lab 11/10/12 1800 11/11/12 0345 11/12/12 0400 11/13/12 0330 11/14/12 0505  INR 1.55* 1.67* 2.11* 2.72* 2.69*    A:  Warfarin for A-fib, anemia (diltion) P:  -warfarin per pharmacy consult - Daily INR until therapeutic -Packed red blood cells for hemoglobin less than 7 g percent only in the setting of anemia critical illness  INFECTIOUS  Recent Labs Lab 11/11/12 0345 11/12/12 0400 11/12/12 1930 11/13/12 0330 11/14/12 0505  WBC 11.1* 10.5 9.3 9.9 17.4*    Recent Labs Lab 11/09/12 1829 11/10/12 0030 11/10/12 0830  LATICACIDVEN 2.3* 2.0 0.9  PROCALCITON  --  13.47  --     A:  Likely pneumonia-aspiration versus hospital acquired. High risk for MRSA P:   - Recheck sepsis biomarkers pro-CAL  -BAL culture - Continue Unasynon but will re\re add vancomycin - Follow WBC  ENDOCRINE  CBG (last 3)   Recent Labs  11/13/12 2010 11/13/12 2349 11/14/12 0333  GLUCAP 172* 178* 193*     A:  DM2 P:   -ICU hyperglycemia protocol  NEUROLOGIC A:  No focal def  P:   - Wean fentanyl infusion - RASS goal 0  Global 11/14/2012: Family not at bedside   The patient is critically ill with multiple organ systems failure and requires  high complexity decision making for assessment and support, frequent evaluation and titration of therapies, application of advanced monitoring technologies and extensive interpretation of multiple databases.   Critical Care Time devoted to patient care services described in this note is  31  Minutes.  Dr. Kalman Shan, M.D., [facility].C.P Pulmonary and Critical Care Medicine Staff Physician [facility] [place] [facility] Pager: [phone], If no answer or between  15:00h - 7:00h: call [phone]  11/14/2012 9:01 AM

## 2012-11-14 NOTE — Progress Notes (Addendum)
ANTICOAGULATION/ANTIBIOTIC CONSULT NOTE - Follow Up Consult  Pharmacy Consult for Coumadin + Vancomycin Indication: Hx afib + aspiration pneumonia  No Known Allergies  Patient Measurements: Height: 5\' 2"  (157.5 cm) Weight: 178 lb 12.7 oz (81.1 kg) IBW/kg (Calculated) : 50.1  Vital Signs: Temp: 99.7 F (37.6 C) (07/08 0400) Temp src: Core (Comment) (07/08 0400) BP: 137/83 mmHg (07/08 0719) Pulse Rate: 102 (07/08 0719)  Labs:  Recent Labs  11/12/12 0400 11/12/12 1930 11/13/12 0330 11/14/12 0505  HGB 6.8* 7.9* 7.9* 8.5*  HCT 22.1* 24.3* 24.6* 26.6*  PLT 196 178 212 297  LABPROT 23.0*  --  27.9* 27.7*  INR 2.11*  --  2.72* 2.69*  CREATININE 2.58*  --  2.46* 2.35*    Estimated Creatinine Clearance: 21.4 ml/min (by C-G formula based on Cr of 2.35).  Assessment: 72yof continues on coumadin for hx afib. INR is therapeutic today. Doses were held 7/6 due to drop in Hgb and then again 7/7 due to significant jump in INR. CBC is stable today. Levaquin has also been discontinued so may coumadin requirements may increase. Continues on amiodarone as pta. No bleeding reported.  Home dose: 2.5mg  daily except 5mg  on Mondays.  Goal of Therapy:  INR 2-3 Monitor platelets by anticoagulation protocol: Yes   Plan:  1) Coumadin 2.5mg  per tube x 1 2) Follow up INR, CBC in AM  Fredrik Rigger 11/14/2012,8:32 AM  Pharmacy now asked to add back vancomycin to unasyn for aspiration pneumonia. She was febrile to 100.2 last night and WBC has jumped from 9.9 to 17.4. SCr is improving with CrCl ~ 86ml/min and UOP ~ 1.5cc/kg/hr.  CTX 7/3 >>7/6 Vanc 7/4 >>7/6, add 7/8>> LVQ 7/4 >>7/7 Unasyn 7/7 (aspiration)>>  7/4 BCx >> ngtd 7/3 UCx >> negative final  Plan: 1) Vancomycin 1000mg  IV q24 2) Continue to follow renal function, cultures, trough at [facility] 11/14/2012, 9:47 AM

## 2012-11-15 LAB — CBC
HCT: 25.6 % — ABNORMAL LOW (ref 36.0–46.0)
Hemoglobin: 8.2 g/dL — ABNORMAL LOW (ref 12.0–15.0)
MCH: 25.5 pg — ABNORMAL LOW (ref 26.0–34.0)
MCV: 82.2 fL (ref 78.0–100.0)
Platelets: 271 10*3/uL (ref 150–400)
RBC: 3.19 MIL/uL — ABNORMAL LOW (ref 3.87–5.11)
RBC: 3.37 MIL/uL — ABNORMAL LOW (ref 3.87–5.11)
RDW: 18.4 % — ABNORMAL HIGH (ref 11.5–15.5)
WBC: 12.5 10*3/uL — ABNORMAL HIGH (ref 4.0–10.5)
WBC: 12.5 10*3/uL — ABNORMAL HIGH (ref 4.0–10.5)

## 2012-11-15 LAB — GLUCOSE, CAPILLARY
Glucose-Capillary: 129 mg/dL — ABNORMAL HIGH (ref 70–99)
Glucose-Capillary: 215 mg/dL — ABNORMAL HIGH (ref 70–99)
Glucose-Capillary: 216 mg/dL — ABNORMAL HIGH (ref 70–99)
Glucose-Capillary: 223 mg/dL — ABNORMAL HIGH (ref 70–99)
Glucose-Capillary: 252 mg/dL — ABNORMAL HIGH (ref 70–99)
Glucose-Capillary: 255 mg/dL — ABNORMAL HIGH (ref 70–99)

## 2012-11-15 LAB — BASIC METABOLIC PANEL
BUN: 65 mg/dL — ABNORMAL HIGH (ref 6–23)
Chloride: 118 mEq/L — ABNORMAL HIGH (ref 96–112)
GFR calc Af Amer: 25 mL/min — ABNORMAL LOW (ref >90)
GFR calc non Af Amer: 22 mL/min — ABNORMAL LOW (ref >90)
Potassium: 3 mEq/L — ABNORMAL LOW (ref 3.5–5.1)
Sodium: 151 mEq/L — ABNORMAL HIGH (ref 135–145)

## 2012-11-15 LAB — PROTIME-INR
INR: 3.31 — ABNORMAL HIGH (ref 0.00–1.49)
Prothrombin Time: 32.4 seconds — ABNORMAL HIGH (ref 11.6–15.2)

## 2012-11-15 LAB — PROCALCITONIN: Procalcitonin: 2.53 ng/mL

## 2012-11-15 MED ORDER — SODIUM CHLORIDE 0.9 % IV SOLN
INTRAVENOUS | Status: DC
  Administered 2012-11-15: 1.9 [IU]/h via INTRAVENOUS
  Administered 2012-11-15: 1.5 [IU]/h via INTRAVENOUS
  Filled 2012-11-15: qty 1

## 2012-11-15 MED ORDER — POTASSIUM CHLORIDE 20 MEQ/15ML (10%) PO LIQD
40.0000 meq | ORAL | Status: AC
  Administered 2012-11-15 (×2): 40 meq
  Filled 2012-11-15 (×2): qty 30

## 2012-11-15 MED ORDER — INSULIN ASPART 100 UNIT/ML ~~LOC~~ SOLN
2.0000 [IU] | SUBCUTANEOUS | Status: DC
  Administered 2012-11-15 (×2): 4 [IU] via SUBCUTANEOUS
  Administered 2012-11-15: 6 [IU] via SUBCUTANEOUS
  Administered 2012-11-15: 4 [IU] via SUBCUTANEOUS
  Administered 2012-11-15 – 2012-11-16 (×2): 6 [IU] via SUBCUTANEOUS
  Administered 2012-11-16: 4 [IU] via SUBCUTANEOUS
  Administered 2012-11-16: 6 [IU] via SUBCUTANEOUS
  Administered 2012-11-16 (×2): 4 [IU] via SUBCUTANEOUS
  Administered 2012-11-16 – 2012-11-17 (×3): 6 [IU] via SUBCUTANEOUS
  Administered 2012-11-17: 4 [IU] via SUBCUTANEOUS
  Administered 2012-11-17 (×2): 6 [IU] via SUBCUTANEOUS
  Administered 2012-11-18: 2 [IU] via SUBCUTANEOUS
  Administered 2012-11-18 (×3): 6 [IU] via SUBCUTANEOUS
  Administered 2012-11-18 (×2): 4 [IU] via SUBCUTANEOUS
  Administered 2012-11-19 (×2): 6 [IU] via SUBCUTANEOUS
  Administered 2012-11-19 (×3): 4 [IU] via SUBCUTANEOUS

## 2012-11-15 NOTE — Progress Notes (Signed)
ANTICOAGULATION/ CONSULT NOTE - Follow Up Consult  Pharmacy Consult for Coumadin  Indication: Hx afib   No Known Allergies  Patient Measurements: Height: 5\' 2"  (157.5 cm) Weight: 172 lb 9.9 oz (78.3 kg) IBW/kg (Calculated) : 50.1  Vital Signs: Temp: 98.1 F (36.7 C) (07/09 0400) Temp src: Core (Comment) (07/09 0400) BP: 116/68 mmHg (07/09 0700) Pulse Rate: 85 (07/09 0700)  Labs:  Recent Labs  11/13/12 0330 11/14/12 0505 11/15/12 0500  HGB 7.9* 8.5* 8.2*  HCT 24.6* 26.6* 25.6*  PLT 212 297 241  LABPROT 27.9* 27.7* 32.4*  INR 2.72* 2.69* 3.31*  CREATININE 2.46* 2.35* 2.15*    Estimated Creatinine Clearance: 22.9 ml/min (by C-G formula based on Cr of 2.15).  Assessment: 72yof continues on coumadin for hx afib. INR is above goal today with trend up (possibly due to HF, nutritional status and antibiotics). Doses were held 7/6 due to drop in Hgb and then again 7/7 due to significant jump in INR. CBC is stable today.   Home dose: 2.5mg  daily except 5mg  on Mondays.  Goal of Therapy:  INR 2-3 Monitor platelets by anticoagulation protocol: Yes   Plan:  -Hold coumadin today -Daily PT/INR  Harland German, Pharm D 11/15/2012 8:46 AM

## 2012-11-15 NOTE — Progress Notes (Signed)
PULMONARY  / CRITICAL CARE MEDICINE  Name: [name] MRN: [id number] DOB: 1941/02/05    ADMISSION DATE:  11/09/2012 CONSULTATION DATE:  11/09/2012  REFERRING MD :  Preston Fleeting PRIMARY SERVICE: PCCM  CHIEF COMPLAINT:  Shortness of breath  BRIEF PATIENT DESCRIPTION: 72 y/o female with CHF was admitted on 7/3 from the [facility] ED with acute hypoxemic respiratory failure due to a CHF exacerbation, ? Superimposed PNA  LINES / TUBES: 7/3 ETT >>11/13/12, 11/13/12  (failed extubation immediately, ? aspirated) >> 7/4 CVC R IJ >>  7/5 A-line >>  CULTURES: 7/3 respiratory >> 7/4 U strep>>> neg 7/4 legionella>>> neg 7/3 mrsa PCR: POSITIVE ........ 11/14/12 - mini-BAL >>     ANTIBIOTICS: 7/3 Ceftriaxone>>>7/6 7/4 levofloxacin>>>7/7 7/4 Vancomycin>>>7/6, 7/8 Vanc >> 7/7 Unasyn    SIGNIFICANT EVENTS / STUDIES:  7/3 admission  11/14/2012: She got reintubated yesterday. Suspected aspiration. Currently on Neo-Synephrine. FiO2 50% and PEEP of 14    SUBJECTIVE/OVERNIGHT/INTERVAL HX  11/15/2012: Improved fio2 and pressor need. Still needing continous sedation though. On IV and PO lopressor for rate control and on neo at same time   VITAL SIGNS: Temp:  [98.1 F (36.7 C)-99 F (37.2 C)] 98.1 F (36.7 C) (07/09 0400) Pulse Rate:  [28-138] 85 (07/09 0700) Resp:  [18-24] 20 (07/09 0700) BP: (90-151)/(49-94) 116/68 mmHg (07/09 0700) SpO2:  [98 %-100 %] 100 % (07/09 0700) Arterial Line BP: (92-152)/(49-106) 152/74 mmHg (07/09 0700) FiO2 (%):  [40 %] 40 % (07/09 0700) Weight:  [78.3 kg (172 lb 9.9 oz)] 78.3 kg (172 lb 9.9 oz) (07/09 0500) HEMODYNAMICS: CVP:  [7 mmHg-15 mmHg] 7 mmHg VENTILATOR SETTINGS: Vent Mode:  [-] PRVC FiO2 (%):  [40 %] 40 % Set Rate:  [20 bmp] 20 bmp Vt Set:  [500 mL] 500 mL PEEP:  [8 cmH20-12 cmH20] 8 cmH20 Plateau Pressure:  [19 cmH20-23 cmH20] 20 cmH20 INTAKE / OUTPUT: Intake/Output     07/08 0701 - 07/09 0700 07/09 0701 - 07/10 0700   I.V. (mL/kg) 1264.3 (16.1)     NG/GT 725.7    IV Piggyback 300    Total Intake(mL/kg) 2290 (29.2)    Urine (mL/kg/hr) 4490 (2.4)    Emesis/NG output     Total Output 4490     Net -2200          Stool Occurrence 2 x      PHYSICAL EXAMINATION:  Gen: looks ill HEENT: NCAT, PERRL, EOMi, ETT in place PULM: Overall coarse breath sounds CV: Tachy, distant heart sounds, no JVD AB: BS+, soft, nontender, no hsm Ext: warm, trace edema, no clubbing, no cyanosis Derm: no rash or skin breakdown Neuro: RASS 0 on fent 118mcg/h but not oriented   LABS:  Recent Labs Lab 11/13/12 0330 11/14/12 0505 11/15/12 0500  NA 141 147* 151*  K 3.4* 4.0 3.0*  CL 110 114* 118*  CO2 21 21 24   BUN 60* 57* 65*  CREATININE 2.46* 2.35* 2.15*  GLUCOSE 263* 227* 134*    Recent Labs Lab 11/13/12 0330 11/14/12 0505 11/15/12 0500  HGB 7.9* 8.5* 8.2*  HCT 24.6* 26.6* 25.6*  WBC 9.9 17.4* 12.5*  PLT 212 297 241   IMAGING x 48h Dg Chest Port 1 View  11/14/2012   *RADIOLOGY REPORT*  Clinical Data: Shortness of breath.  PORTABLE CHEST - 1 VIEW  Comparison: 11/13/2012  Findings: Endotracheal tube tip measures about 3.6 cm above the carina.  Enteric tube tip is out of the field of view but below the left  hemidiaphragm.  Right central venous catheter tip in the cavoatrial junction.  Cardiac enlargement with mild pulmonary vascular congestion and perihilar edema.  Edema pattern appears slightly improved.  Probable right pleural effusion.  IMPRESSION: Appliances appear in satisfactory position.  Cardiac enlargement with pulmonary vascular congestion and improving edema.  Right pleural effusion.   Original Report Authenticated By: Burman Nieves, M.D.   Dg Chest Port 1 View  11/13/2012   *RADIOLOGY REPORT*  Clinical Data: ET tube placement.  PORTABLE CHEST - 1 VIEW  Comparison: Chest x-ray from earlier the same date.  Findings: Endotracheal tube terminates in the mid thoracic trachea. Gastric suction tube crosses the diaphragm.  Right IJ  central venous catheter at the superior caval atrial junction.  No cardiomegaly.  Worsening diffuse  interstitial coarsening with small bilateral pleural effusions.  No asymmetric opacity.  No pneumothorax.  IMPRESSION: 1.  Endotracheal tube ends in the mid thoracic trachea. 2.  Worsening pulmonary edema compared to earlier today.   Original Report Authenticated By: Tiburcio Pea   Dg Abd Portable 1v  11/13/2012   *RADIOLOGY REPORT*  Clinical Data: OG tube placement  PORTABLE ABDOMEN - 1 VIEW  Comparison: None  Findings: Enteric tube tip is in the stomach.  The side port is below the GE junction. Bowel gas pattern appears well within normal limits.  IMPRESSION:  1.  Enteric tube placement with tip in the stomach.   Original Report Authenticated By: Signa Kell, M.D.     ASSESSMENT / PLAN:  PULMONARY  Recent Labs Lab 11/12/12 1438 11/13/12 0331 11/13/12 1427 11/13/12 1637 11/14/12 0505  PHART 7.279* 7.278* 7.189* 7.236* 7.338*  PCO2ART 44.9 44.3 53.4* 48.2* 38.9  PO2ART 121.0* 171.0* 64.0* 56.0* 153.0*  HCO3 20.9 20.7 20.3 20.4 20.3  TCO2 22 22 22 22  21.4  O2SAT 98.0 99.0 85.0 82.0 99.5    A: Acute hypercarbic and hypoxemic respiratory failure in setting of bilateral pulmonary infiltrates. Suspect CHF and decomp edema, but CAP w/ ALI on diff dx. Her response to diuresis supports significant component of edema.  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 Fio2 though down to 40%, Peep 5  P:   -Full ventilator support but wean PEEP as tolerated -Continue BD -see CV section  -Solumedrol to continoue for now -Wean FiO2 as tolerated  CARDIOVASCULAR  Recent Labs Lab 11/09/12 1827 11/10/12 0030  PROBNP 1423.0* 2952.0*      Recent Labs Lab 11/10/12 0030 11/10/12 0700 11/10/12 1330  TROPONINI <0.30 <0.30 <0.30     A:  Acute decompensated  CHF HOCM Afib History of noncompliance  - November 15, 2012: Cardiology diuresing Patient rate conrolled on IV and PO lopressor. Patient also on neo but being weaned off P:  - Keep CVP <10 per cardiology recommendations - dc po lopressor - continue IV lopressor; might need to dc if cannot wean off pressors  - Lasix prn per cards;  maintain a euvolemic vs negative balance - Appreciate cardiology recommendations  RENAL  Recent Labs Lab 11/10/12 0700 11/11/12 0345 11/12/12 0400 11/13/12 0330 11/14/12 0505 11/15/12 0500  NA 139 139 139 141 147* 151*  K 3.9 4.3 3.7 3.4* 4.0 3.0*  CL 107 109 110 110 114* 118*  CO2 22 23 22 21 21 24   GLUCOSE 110* 148* 153* 263* 227* 134*  BUN 22 37* 47* 60* 57* 65*  CREATININE 2.33* 2.43* 2.58* 2.46* 2.35* 2.15*  CALCIUM 10.4 10.8* 10.5 11.4* 12.4* 12.8*  MG 1.9 1.8 1.9 2.1 2.3  --   PHOS 4.5  --   --   --  3.2  --    Intake/Output     07/08 0701 - 07/09 0700 07/09 0701 - 07/10 0700   I.V. (mL/kg) 1264.3 (16.1)    NG/GT 725.7    IV Piggyback 300    Total Intake(mL/kg) 2290 (29.2)    Urine (mL/kg/hr) 4490 (2.4)    Emesis/NG output     Total Output 4490     Net -2200          Stool Occurrence 2 x      A:   AKI- likely prerenal in origin (FeNa 0.8%) CKD  11/15/12 Mild hypernatremia due to lasi ad AKI with CRI P:   - Ensure renal perfusion with MAP >60 - monitor UOP  - repeat BMET in AM;monitor creat with lasix - replete electrolytes as indicated   GASTROINTESTINAL  Recent Labs Lab 11/09/12 1828  11/11/12 0345 11/12/12 0400 11/13/12 0330 11/14/12 0505 11/15/12 0500  AST 26  --   --   --   --   --   --   ALT 15  --   --   --   --   --   --   ALKPHOS 195*  --   --   --   --   --   --   BILITOT 0.4  --   --   --   --   --   --   PROT 7.5  --   --   --   --   --   --   ALBUMIN 3.3*  --   --   --   --   --   --   INR  --   < > 1.67* 2.11* 2.72* 2.69* 3.31*  < > = values in this interval not displayed.  A:  No acute issues P:    - Protonix for SUP - Continue enteral feeds  HEMATOLOGIC  Recent Labs Lab 11/13/12 0330 11/14/12 0505 11/15/12 0500  HGB 7.9* 8.5* 8.2*  HCT 24.6* 26.6* 25.6*  WBC 9.9 17.4* 12.5*  PLT 212 297 241    Recent Labs Lab 11/11/12 0345 11/12/12 0400 11/13/12 0330 11/14/12 0505 11/15/12 0500  INR 1.67* 2.11* 2.72* 2.69* 3.31*    A:  Warfarin for A-fib, anemia (diltion) P:  -warfarin per pharmacy consult - Daily INR until therapeutic -Packed red blood cells for hemoglobin less than 7 g percent only in the setting of anemia critical illness  INFECTIOUS  Recent Labs Lab 11/12/12 0400 11/12/12 1930 11/13/12 0330 11/14/12 0505 11/15/12 0500  WBC 10.5 9.3 9.9 17.4* 12.5*    Recent Labs Lab 11/09/12 1829 11/10/12 0030 11/10/12 0830 11/14/12 0950 11/15/12 0500  LATICACIDVEN 2.3* 2.0 0.9  --   --   PROCALCITON  --  13.47  --  3.42 2.53    A:  Likely pneumonia-aspiration versus hospital acquired on 11/13/12. High risk for MRSA  - 11/15/12: PCT indicates localized ifection  P:   - - Continue Unasynon and  vancomycin - Follow WBC and BAL  ENDOCRINE CBG (last 3)   Recent Labs  11/15/12 0306 11/15/12 0410 11/15/12 0511  GLUCAP 255* 223* 169*     A:  DM2 P:   -ICU hyperglycemia protocol  NEUROLOGIC A:  No focal def . Notin pain P:   - Wean fentanyl infusion as  much as possible - RASS goal 0  Global 11/14/2012 and 11/15/12: Family not at bedside   The patient is critically ill with multiple organ systems failure and requires high complexity decision making for assessment and support, frequent evaluation and titration of therapies, application of advanced monitoring technologies and extensive interpretation of multiple databases.   Critical Care Time devoted to patient care services described in this note is  31  Minutes.  Dr. Kalman Shan, M.D., [facility].C.P Pulmonary and Critical Care Medicine Staff Physician [facility] [place] [facility]  [facility] Pager: [phone], If no answer or between  15:00h - 7:00h: call [phone]  11/15/2012 8:29 AM

## 2012-11-15 NOTE — Progress Notes (Signed)
eLink Physician-Brief Progress Note Patient Name: [name] DOB: 06/18/1940 MRN: [id number]  Date of Service  11/15/2012   HPI/Events of Note  hypokalemia   eICU Interventions  Potassium replaced   Intervention Category Intermediate Interventions: Electrolyte abnormality - evaluation and management  Maurico Perrell 11/15/2012, 6:41 AM

## 2012-11-15 NOTE — Progress Notes (Signed)
Patient: [name] / Admit Date: 11/09/2012 / Date of Encounter: 11/15/2012, 7:08 AM   Subjective  Easy to awaken on vent. Denies pain.   Objective   Telemetry: atrial flutter currently rate controlled Physical Exam: Filed Vitals:   11/15/12 0602  BP: 90/49  Pulse: 76  Temp: 98.1  Resp: 22   General: Well developed AAF in no acute distress. Head: Normocephalic, atraumatic, sclera non-icteric, no xanthomas, nares are without discharge. Neck: Supple. Lungs: Clear bilaterally to auscultation without wheezes, rales, or rhonchi. Breathing is unlabored on vent. Heart: Irregularly irregular, S1 S2 without murmurs, rubs, or gallops.  Abdomen: Soft, non-tender, non-distended with normoactive bowel sounds. Msk:  Tone appears normal for age. Extremities: No clubbing or cyanosis. No edema.  Distal pedal pulses are 2+ and equal bilaterally. Neuro: Moves all extremities spontaneously and follows commands.    Intake/Output Summary (Last 24 hours) at 11/15/12 0708 Last data filed at 11/15/12 0630  Gross per 24 hour  Intake 2225.96 ml  Output   4490 ml  Net -2264.04 ml    Inpatient Medications:  . amiodarone  200 mg Oral Daily  . ampicillin-sulbactam (UNASYN) IV  1.5 g Intravenous Q12H  . antiseptic oral rinse  15 mL Mouth Rinse BID  . atorvastatin  20 mg Oral q1800  . chlorhexidine  15 mL Mouth/Throat BID  . Chlorhexidine Gluconate Cloth  6 each Topical Q0600  . docusate  100 mg Per Tube BID  . feeding supplement (OSMOLITE 1.5 CAL)  1,000 mL Per Tube Q24H  . feeding supplement  30 mL Per Tube 5 X Daily  . furosemide  40 mg Per Tube Daily  . ipratropium  0.5 mg Nebulization Q6H  . methylPREDNISolone (SOLU-MEDROL) injection  40 mg Intravenous Q12H  . metoprolol  2.5 mg Intravenous Q6H  . metoprolol tartrate  12.5 mg Oral BID  . multivitamin  5 mL Per Tube Daily  . mupirocin ointment  1 application Nasal BID  . pantoprazole sodium  40 mg Per Tube QHS  . polyethylene glycol  17 g  Per Tube BID  . potassium chloride  40 mEq Per Tube Q4H  . sodium chloride  3 mL Intravenous Q12H  . vancomycin  1,000 mg Intravenous Q24H  . Warfarin - Pharmacist Dosing Inpatient   Does not apply q1800    Labs:  Recent Labs  11/13/12 0330 11/14/12 0505 11/15/12 0500  NA 141 147* 151*  K 3.4* 4.0 3.0*  CL 110 114* 118*  CO2 21 21 24   GLUCOSE 263* 227* 134*  BUN 60* 57* 65*  CREATININE 2.46* 2.35* 2.15*  CALCIUM 11.4* 12.4* 12.8*  MG 2.1 2.3  --   PHOS  --  3.2  --    No results found for this basename: AST, ALT, ALKPHOS, BILITOT, PROT, ALBUMIN,  in the last 72 hours  Recent Labs  11/14/12 0505 11/15/12 0500  WBC 17.4* 12.5*  HGB 8.5* 8.2*  HCT 26.6* 25.6*  MCV 81.3 80.3  PLT 297 241    Radiology/Studies:  Dg Chest 2 View  11/01/2012   *RADIOLOGY REPORT*  Clinical Data: Shortness of breath.  Inhalation injury.  CHEST - 2 VIEW  Comparison: 09/01/2012  Findings: Hyperinflation of the lungs compatible with COPD.  Mild interstitial prominence, improved since prior study.  Cardiomegaly. Tortuosity of the thoracic aorta.  No confluent opacities or effusions.  No acute bony abnormality.  IMPRESSION: Stable COPD. Chronic interstitial changes.  Cardiomegaly.  No acute findings.   Original Report  Authenticated By: Charlett Nose, M.D.   Dg Chest Port 1 View  11/14/2012   *RADIOLOGY REPORT*  Clinical Data: Shortness of breath.  PORTABLE CHEST - 1 VIEW  Comparison: 11/13/2012  Findings: Endotracheal tube tip measures about 3.6 cm above the carina.  Enteric tube tip is out of the field of view but below the left hemidiaphragm.  Right central venous catheter tip in the cavoatrial junction.  Cardiac enlargement with mild pulmonary vascular congestion and perihilar edema.  Edema pattern appears slightly improved.  Probable right pleural effusion.  IMPRESSION: Appliances appear in satisfactory position.  Cardiac enlargement with pulmonary vascular congestion and improving edema.  Right pleural  effusion.   Original Report Authenticated By: Burman Nieves, M.D.   Dg Chest Port 1 View  11/13/2012   *RADIOLOGY REPORT*  Clinical Data: ET tube placement.  PORTABLE CHEST - 1 VIEW  Comparison: Chest x-ray from earlier the same date.  Findings: Endotracheal tube terminates in the mid thoracic trachea. Gastric suction tube crosses the diaphragm.  Right IJ central venous catheter at the superior caval atrial junction.  No cardiomegaly.  Worsening diffuse  interstitial coarsening with small bilateral pleural effusions.  No asymmetric opacity.  No pneumothorax.  IMPRESSION: 1.  Endotracheal tube ends in the mid thoracic trachea. 2.  Worsening pulmonary edema compared to earlier today.   Original Report Authenticated By: Tiburcio Pea   Dg Chest Port 1 View  11/13/2012   *RADIOLOGY REPORT*  Clinical Data: Worsening pulmonary edema.  PORTABLE CHEST - 1 VIEW  Comparison: 11/12/2012  Findings: 0646 hours.  Endotracheal tube tip is 3.3 cm above the base of the carina. The NG tube passes into the stomach although the distal tip position is not included on the film.  Slight interval improvement and interstitial pulmonary edema.  Persistent basilar atelectasis with small bilateral pleural effusions noted right IJ central line tip overlies the mid SVC level. Telemetry leads overlie the chest.  IMPRESSION: Interval improvement and interstitial pulmonary edema.  Otherwise no substantial change.   Original Report Authenticated By: Kennith Center, M.D.   Dg Chest Port 1 View  11/12/2012   *RADIOLOGY REPORT*  Clinical Data: Evaluate lung fields, CHF  PORTABLE CHEST - 1 VIEW  Comparison: Prior chest x-ray 11/11/2012  Findings: Endotracheal tube tip is 3.9 cm above the carina.  Stable right IJ central venous catheter with the tip in the superior cavoatrial junction.  The nasogastric tube tip lies off the field of view, presumably within the stomach.  Increased pulmonary vascular congestion and interstitial edema with worsening  bibasilar opacities.  The degree of cardiomegaly is similar compared to prior.  Probable small bilateral pleural effusions.  No pneumothorax.  IMPRESSION:  Worsening pulmonary edema now with small bilateral effusions and bibasilar atelectasis.  Stable and satisfactory support apparatus.   Original Report Authenticated By: Malachy Moan, M.D.   Dg Chest Port 1 View  11/11/2012   *RADIOLOGY REPORT*  Clinical Data: Hypoxia  PORTABLE CHEST - 1 VIEW  Comparison: Prior chest x-ray 11/10/2012  Findings: Endotracheal tube 4.9 cm above the carina.  Stable position of right IJ central venous catheter in the visualized portion of the nasogastric tube.  Increased bilateral interstitial and airspace opacities consistent with increasing pulmonary edema. Stable cardiomegaly.  Probable left pleural effusion.  No pneumothorax.  No acute osseous abnormality.  IMPRESSION:  Increasing pulmonary edema.  Stable satisfactory support apparatus.   Original Report Authenticated By: Malachy Moan, M.D.   Dg Chest Port 1 View  11/11/2012   *  RADIOLOGY REPORT*  Clinical Data: evaluate lungs and line placement  PORTABLE CHEST - 1 VIEW  Comparison: 11/10/2012  Findings: Endotracheal tube tip is above the carina.  There is a right IJ catheter with tip in the cavoatrial junction.  Enteric tube tip is within the stomach.  There is mild cardiac enlargement and pulmonary vascular congestion.  No airspace consolidation.  IMPRESSION:  1.  Stable support apparatus. 2.  Cardiac enlargement and pulmonary vascular congestion.   Original Report Authenticated By: Signa Kell, M.D.   Dg Chest Port 1 View  11/10/2012   *RADIOLOGY REPORT*  Clinical Data: Coronary artery disease.  Line placement.  PORTABLE CHEST - 1 VIEW  Comparison: 11/10/2012 at to 09/23/1988  Findings: New right IJ catheter noted with tip projecting over the lower SVC.  No pneumothorax.  Endotracheal tube tip 4.6 cm above the carina.  Nasogastric tube enters the stomach.  Moderate  cardiomegaly noted with interstitial edema.  Airspace edema is improved.  Mild retrocardiac airspace opacity.  Mildly blunted left costophrenic angle.  IMPRESSION:  1.  Improved edema. 2.  Right IJ line tip:  Lower SVC.  No pneumothorax. 3.  Cardiomegaly. 4.  Left lower lobe airspace opacity potentially representing atelectasis or pneumonia.  Potential small left pleural effusion.   Original Report Authenticated By: Gaylyn Rong, M.D.   Dg Chest Port 1 View  11/10/2012   *RADIOLOGY REPORT*  Clinical Data: Check ETT  PORTABLE CHEST - 1 VIEW  Comparison: 11/09/2012  Findings: Cardiomegaly with suspected mild interstitial edema. Superimposed right upper lobe pneumonia is possible.  Patchy left lower lobe opacity, likely a combination of atelectasis and small pleural effusion.  No pneumothorax.  Endotracheal tube terminates 4 cm above the carina.  Enteric tube courses into the stomach.  IMPRESSION: Endotracheal tube terminates 4 cm above the carina.  Cardiomegaly with mild interstitial edema and small left pleural effusion.  Superimposed right upper lobe pneumonia is possible.   Original Report Authenticated By: Charline Bills, M.D.   Dg Chest Portable 1 View  11/09/2012   *RADIOLOGY REPORT*  Clinical Data: Bradycardia.  PORTABLE CHEST - 1 VIEW  Comparison: Chest 11/09/2012 and 1748 hours.  Findings: Support tubes and lines are unchanged.  Pulmonary edema and pleural effusions seen on the prior study persist but have improved.  There is cardiomegaly.  No pneumothorax.  IMPRESSION: Improved pulmonary edema.   Original Report Authenticated By: Holley Dexter, M.D.   Dg Chest Portable 1 View  11/09/2012   *RADIOLOGY REPORT*  Clinical Data: Respiratory distress.  PORTABLE CHEST - 1 VIEW  Comparison: Plain film chest 10/31/2012.  Findings: The patient has a new endotracheal tube in place with the tip in good position 2.7 cm above the carina.  There is extensive bilateral airspace disease and small effusions,  larger on the right.  Cardiomegaly is noted.  IMPRESSION:  1.  ET tube in good position. 2.  Extensive bilateral airspace disease and small effusions likely due to pulmonary edema.   Original Report Authenticated By: Holley Dexter, M.D.   Dg Abd Portable 1v  11/13/2012   *RADIOLOGY REPORT*  Clinical Data: OG tube placement  PORTABLE ABDOMEN - 1 VIEW  Comparison: None  Findings: Enteric tube tip is in the stomach.  The side port is below the GE junction. Bowel gas pattern appears well within normal limits.  IMPRESSION:  1.  Enteric tube placement with tip in the stomach.   Original Report Authenticated By: Signa Kell, M.D.     Assessment and  Plan  1. Recurrent acute hypoxic/hypercarbic respiratory failure requiring ventilation - extubated 7/7 but required reintubation for likely aspiration of vomitus - ?multifactorial from CHF/PNA. 2. 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 CVP 7 this AM and weight down. Adjustment of diuretic per MD. 3. 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 4. CAP with aspiration this admission - per PCCM. 5. History of AF - went into fib/flutter overnight but is currently rate controlled. Continue coumadin. She is on both oral and IV metoprolol. Consider discontinuing one of the two to simplify. 6. 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 7. 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 8. Acute on chronic anemia s/p transfusion - hemoccult stools. 9. Electrolyte derangement with hypernatremia, hypokalemia, hyperchloremia, hypercalcemia - per PCCM.  MD to follow.  Signed, Ronie Spies PA-C Patient examined and agree. No change in current cardiac treatment.  Valera Castle, MD 11/15/2012 7:56 AM

## 2012-11-16 ENCOUNTER — Inpatient Hospital Stay (HOSPITAL_COMMUNITY): Payer: Medicare Other

## 2012-11-16 LAB — POCT I-STAT 3, ART BLOOD GAS (G3+)
Acid-Base Excess: 3 mmol/L — ABNORMAL HIGH (ref 0.0–2.0)
Bicarbonate: 12.1 mEq/L — ABNORMAL LOW (ref 20.0–24.0)
Bicarbonate: 26.6 mEq/L — ABNORMAL HIGH (ref 20.0–24.0)
O2 Saturation: 89 %
Patient temperature: 99
TCO2: 13 mmol/L (ref 0–100)
TCO2: 28 mmol/L (ref 0–100)
pCO2 arterial: 39.5 mmHg (ref 35.0–45.0)
pH, Arterial: 7.094 — CL (ref 7.350–7.450)

## 2012-11-16 LAB — GLUCOSE, CAPILLARY
Glucose-Capillary: 250 mg/dL — ABNORMAL HIGH (ref 70–99)
Glucose-Capillary: 198 mg/dL — ABNORMAL HIGH (ref 70–99)
Glucose-Capillary: 194 mg/dL — ABNORMAL HIGH (ref 70–99)

## 2012-11-16 LAB — BASIC METABOLIC PANEL
BUN: 87 mg/dL — ABNORMAL HIGH (ref 6–23)
CO2: 28 mEq/L (ref 19–32)
Calcium: 13.3 mg/dL (ref 8.4–10.5)
Chloride: 120 mEq/L — ABNORMAL HIGH (ref 96–112)
Creatinine, Ser: 2.4 mg/dL — ABNORMAL HIGH (ref 0.50–1.10)

## 2012-11-16 LAB — LACTIC ACID, PLASMA: Lactic Acid, Venous: 1.8 mmol/L (ref 0.5–2.2)

## 2012-11-16 LAB — CULTURE, BLOOD (ROUTINE X 2)
Culture: NO GROWTH
Culture: NO GROWTH

## 2012-11-16 LAB — PROTIME-INR: INR: 3.32 — ABNORMAL HIGH (ref 0.00–1.49)

## 2012-11-16 LAB — PROCALCITONIN: Procalcitonin: 1.65 ng/mL

## 2012-11-16 LAB — MAGNESIUM: Magnesium: 2.5 mg/dL (ref 1.5–2.5)

## 2012-11-16 MED ORDER — SODIUM CHLORIDE 0.9 % IV SOLN
INTRAVENOUS | Status: DC
  Administered 2012-11-18 (×2): 250 mL via INTRAVENOUS
  Administered 2012-11-20: 11:00:00 via INTRAVENOUS
  Administered 2012-11-20: 250 mL via INTRAVENOUS

## 2012-11-16 MED ORDER — SODIUM BICARBONATE 8.4 % IV SOLN
50.0000 meq | Freq: Once | INTRAVENOUS | Status: AC
  Administered 2012-11-16: 50 meq via INTRAVENOUS
  Filled 2012-11-16: qty 50

## 2012-11-16 MED ORDER — DEXTROSE 5 % IV SOLN
INTRAVENOUS | Status: DC
  Administered 2012-11-16: 06:00:00 via INTRAVENOUS

## 2012-11-16 MED ORDER — DEXTROSE 5 % IV SOLN: INTRAVENOUS | Status: DC

## 2012-11-16 MED ORDER — PREDNISONE 20 MG PO TABS
40.0000 mg | ORAL_TABLET | Freq: Every day | ORAL | Status: DC
  Administered 2012-11-17 – 2012-11-20 (×4): 40 mg
  Filled 2012-11-16 (×5): qty 2

## 2012-11-16 MED ORDER — METOPROLOL TARTRATE 1 MG/ML IV SOLN
5.0000 mg | Freq: Four times a day (QID) | INTRAVENOUS | Status: DC
  Administered 2012-11-16 – 2012-11-17 (×4): 5 mg via INTRAVENOUS
  Filled 2012-11-16 (×5): qty 5

## 2012-11-16 MED ORDER — SODIUM BICARBONATE 8.4 % IV SOLN
INTRAVENOUS | Status: AC
  Filled 2012-11-16: qty 50

## 2012-11-16 MED ORDER — FREE WATER
100.0000 mL | Freq: Three times a day (TID) | Status: DC
  Administered 2012-11-16 – 2012-11-17 (×3): 100 mL

## 2012-11-16 MED ORDER — IPRATROPIUM BROMIDE 0.02 % IN SOLN: 0.5000 mg | Freq: Four times a day (QID) | RESPIRATORY_TRACT | Status: DC | PRN

## 2012-11-16 MED ORDER — IPRATROPIUM BROMIDE 0.02 % IN SOLN
0.5000 mg | Freq: Four times a day (QID) | RESPIRATORY_TRACT | Status: DC
  Administered 2012-11-16 – 2012-11-17 (×7): 0.5 mg via RESPIRATORY_TRACT
  Filled 2012-11-16 (×7): qty 2.5

## 2012-11-16 MED ORDER — SODIUM CHLORIDE 0.9 % IV SOLN
25.0000 ug/h | INTRAVENOUS | Status: DC
  Administered 2012-11-17: 150 ug/h via INTRAVENOUS
  Administered 2012-11-18: 75 ug/h via INTRAVENOUS
  Administered 2012-11-20: 50 ug/h via INTRAVENOUS
  Filled 2012-11-16 (×4): qty 50

## 2012-11-16 MED ORDER — FENTANYL BOLUS VIA INFUSION
25.0000 ug | Freq: Four times a day (QID) | INTRAVENOUS | Status: DC | PRN
  Administered 2012-11-20: 100 ug via INTRAVENOUS
  Administered 2012-11-21: 50 ug via INTRAVENOUS
  Filled 2012-11-16: qty 100

## 2012-11-16 NOTE — Progress Notes (Signed)
NUTRITION FOLLOW UP  Intervention:   1.  Enteral nutrition; recommend re-initiation of Osmolite 1.5 @ 20 mL/hr continuous with Prostat 5 times daily to provide 1220 kcal, 105g protein, and 366 mL free water.  If aspiration risk increased, pt would benefit from post-pyloric tube placement.   Nutrition Dx:   Inadequate oral intake, ongoing  Monitor:   1.  Enteral nutrition; .Enteral nutrition to provide 60-70% of estimated calorie needs (22-25 kcals/kg ideal body weight) and 100% of estimated protein needs, based on ASPEN guidelines for permissive underfeeding in critically ill obese individuals. 2.  Wt/wt change; monitor trends  Assessment:   Pt admitted with respiratory failure due to CHF exacerbation. Pt remains intubated after episode of vomiting.  Patient is currently intubated on ventilator support.  MV: 10.3 L/min Temp:Temp (24hrs), Avg:98.5 F (36.9 C), Min:97.3 F (36.3 C), Max:99 F (37.2 C)  Discussed with RN who reports pt is tolerating TFs well.  stooling daily.  0 mL residuals.  Height: Ht Readings from Last 1 Encounters:  11/09/12 5\' 2"  (1.575 m)    Weight Status:   Wt Readings from Last 1 Encounters:  11/16/12 173 lb 4.5 oz (78.6 kg)    Re-estimated needs:  Kcal: 1649 Permissive underfeeding goals:  1150-1250 kcal/d  Protein: >/=100g Fluid: ~1.8 L/day  Skin: intact, non-pitting edema  Diet Order: NPO   Intake/Output Summary (Last 24 hours) at 11/16/12 1455 Last data filed at 11/16/12 1400  Gross per 24 hour  Intake 1807.5 ml  Output   2045 ml  Net -237.5 ml    Last BM: 7/10   Labs:   Recent Labs Lab 11/10/12 0700  11/13/12 0330 11/14/12 0505 11/15/12 0500 11/16/12 0440  NA 139  < > 141 147* 151* 153*  K 3.9  < > 3.4* 4.0 3.0* 4.8  CL 107  < > 110 114* 118* 120*  CO2 22  < > 21 21 24 28   BUN 22  < > 60* 57* 65* 87*  CREATININE 2.33*  < > 2.46* 2.35* 2.15* 2.40*  CALCIUM 10.4  < > 11.4* 12.4* 12.8* 13.3*  MG 1.9  < > 2.1 2.3  --   2.5  PHOS 4.5  --   --  3.2  --  3.0  GLUCOSE 110*  < > 263* 227* 134* 225*  < > = values in this interval not displayed.  CBG (last 3)   Recent Labs  11/16/12 0400 11/16/12 0759 11/16/12 1242  GLUCAP 198* 219* 251*    Scheduled Meds: . amiodarone  200 mg Oral Daily  . ampicillin-sulbactam (UNASYN) IV  1.5 g Intravenous Q12H  . antiseptic oral rinse  15 mL Mouth Rinse BID  . atorvastatin  20 mg Oral q1800  . chlorhexidine  15 mL Mouth/Throat BID  . Chlorhexidine Gluconate Cloth  6 each Topical Q0600  . docusate  100 mg Per Tube BID  . feeding supplement (OSMOLITE 1.5 CAL)  1,000 mL Per Tube Q24H  . feeding supplement  30 mL Per Tube 5 X Daily  . free water  100 mL Per Tube Q8H  . insulin aspart  2-6 Units Subcutaneous Q4H  . ipratropium  0.5 mg Nebulization Q6H  . metoprolol  5 mg Intravenous Q6H  . multivitamin  5 mL Per Tube Daily  . mupirocin ointment  1 application Nasal BID  . pantoprazole sodium  40 mg Per Tube QHS  . polyethylene glycol  17 g Per Tube BID  . [START ON 11/17/2012]  predniSONE  40 mg Per Tube Q breakfast  . sodium bicarbonate      . sodium bicarbonate  50 mEq Intravenous Once  . vancomycin  1,000 mg Intravenous Q24H  . Warfarin - Pharmacist Dosing Inpatient   Does not apply q1800    Continuous Infusions: . dextrose 20 mL (11/16/12 1151)  . fentaNYL infusion INTRAVENOUS      Loyce Dys, MS RD LDN Clinical Inpatient Dietitian Pager: [phone] Weekend/After hours pager: [phone]

## 2012-11-16 NOTE — Progress Notes (Signed)
Panic value ABG given to RN Crystal Rice.

## 2012-11-16 NOTE — Progress Notes (Signed)
Inpatient Diabetes Program Recommendations  AACE/ADA: New Consensus Statement on Inpatient Glycemic Control (2013)  Target Ranges:  Prepandial:   less than 140 mg/dL      Peak postprandial:   less than 180 mg/dL (1-2 hours)      Critically ill patients:  140 - 180 mg/dL   Inpatient Diabetes Program Recommendations Insulin - Basal: consider starting Lantus 10 units during steroid therapy Insulin - Meal Coverage: Tube feed coverage and low dose basal insulin should help control CBGs Thank you  Piedad Climes BSN, RN,CDE Inpatient Diabetes Coordinator [phone] (team pager)

## 2012-11-16 NOTE — Progress Notes (Addendum)
eLink Physician-Brief Progress Note Patient Name: [name] DOB: 03-21-1941 MRN: [id number]  Date of Service  11/16/2012   HPI/Events of Note   Hyperca, dry,   eICU Interventions  Add d5w, dc lasix   Intervention Category Minor Interventions: Electrolytes abnormality - evaluation and management  FEINSTEIN,DANIEL J. 11/16/2012, 5:51 AM

## 2012-11-16 NOTE — Progress Notes (Signed)
PULMONARY  / CRITICAL CARE MEDICINE  Name: [name] MRN: [id number] DOB: 04/18/1941    ADMISSION DATE:  11/09/2012 CONSULTATION DATE:  11/09/2012  REFERRING MD :  Preston Fleeting PRIMARY SERVICE: PCCM  CHIEF COMPLAINT:  Shortness of breath  BRIEF PATIENT DESCRIPTION: 72 y/o female with CHF was admitted on 7/3 from the [facility] ED with acute hypoxemic respiratory failure due to a CHF exacerbation, ? Superimposed PNA  LINES / TUBES: 7/3 ETT >>11/13/12, 11/13/12  (failed extubation immediately, ? aspirated) >> 7/3 Foley >>  7/4 CVC R IJ >>  7/5 A-line >>  CULTURES: 7/3 respiratory >> 7/4 U strep >> neg 7/4 legionella >> neg 7/3 mrsa PCR >> POSITIVE 7/8 mini-BAL >> MODERATE WBC PRESENT, PREDOMINANTLY PMN  ANTIBIOTICS: 7/3 Ceftriaxone>>>7/6 7/4 levofloxacin>>>7/7 7/4 Vancomycin>>>7/6, restarted 7/8 >> 7/7 Unasyn  SIGNIFICANT EVENTS / STUDIES:  7/3 admission 7/07 reintubated immediately after extubation.  ? Aspiration. ABX restarted.  on Neo-Synephrine.  7/09 Able to wean fio2 and neo. Lopressor required for rate control of afib.  SUBJECTIVE/OVERNIGHT/INTERVAL HX 7/10 Off neo, but now tachycardic.  Failed SBT this AM, but RT felt it was due to sedation.  Sedation weaned  VITAL SIGNS: Temp:  [97.3 F (36.3 C)-98.8 F (37.1 C)] 98.6 F (37 C) (07/10 0500) Pulse Rate:  [49-119] 91 (07/10 0738) Resp:  [13-25] 13 (07/10 0738) BP: (86-147)/(53-84) 147/75 mmHg (07/10 0738) SpO2:  [93 %-100 %] 100 % (07/10 0738) Arterial Line BP: (93-161)/(54-88) 110/62 mmHg (07/10 0500) FiO2 (%):  [40 %] 40 % (07/10 0738) Weight:  [78.6 kg (173 lb 4.5 oz)] 78.6 kg (173 lb 4.5 oz) (07/10 0500)  HEMODYNAMICS: CVP:  [10 mmHg-12 mmHg] 12 mmHg  VENTILATOR SETTINGS: Vent Mode:  [-] CPAP FiO2 (%):  [40 %] 40 % Set Rate:  [20 bmp] 20 bmp Vt Set:  [500 mL] 500 mL PEEP:  [5 cmH20] 5 cmH20 Pressure Support:  [12 cmH20] 12 cmH20 Plateau Pressure:  [16 cmH20-18 cmH20] 17 cmH20  INTAKE / OUTPUT: Intake/Output      07/09 0701 - 07/10 0700 07/10 0701 - 07/11 0700   I.V. (mL/kg) 545.3 (6.9)    NG/GT 700    IV Piggyback 250    Total Intake(mL/kg) 1495.3 (19)    Urine (mL/kg/hr) 1395 (0.7)    Total Output 1395     Net +100.3           PHYSICAL EXAMINATION: Gen: elderly female patient in NAD on ventilator. HEENT: NCAT, PERRL, ETT in place PULM: CTA bilaterally, no wheezes or rubs. CV: Tachy, distant heart sounds, no JVD AB: BS+, soft, nontender, no hsm Ext: warm, trace edema, no clubbing, no cyanosis Derm: no rash or skin breakdown Neuro: RASS -1, moves all ext to pain, but will not follow commands  LABS:  Recent Labs Lab 11/14/12 0505 11/15/12 0500 11/16/12 0440  NA 147* 151* 153*  K 4.0 3.0* 4.8  CL 114* 118* 120*  CO2 21 24 28   BUN 57* 65* 87*  CREATININE 2.35* 2.15* 2.40*  GLUCOSE 227* 134* 225*    Recent Labs Lab 11/14/12 0505 11/15/12 0500 11/15/12 2330  HGB 8.5* 8.2* 8.6*  HCT 26.6* 25.6* 27.7*  WBC 17.4* 12.5* 12.5*  PLT 297 241 271   IMAGING 7/10 CXR - Poor inspiratory effort, pulmonary edema bilaterally although improved from 7/09. Bilateral lower lobe infiltrate vs effusion. Lines in good position.  ASSESSMENT / PLAN:  PULMONARY  Recent Labs Lab 11/12/12 1438 11/13/12 0331 11/13/12 1427 11/13/12 1637 11/14/12 0505  PHART 7.279* 7.278* 7.189* 7.236* 7.338*  PCO2ART 44.9 44.3 53.4* 48.2* 38.9  PO2ART 121.0* 171.0* 64.0* 56.0* 153.0*  HCO3 20.9 20.7 20.3 20.4 20.3  TCO2 22 22 22 22  21.4  O2SAT 98.0 99.0 85.0 82.0 99.5    A: Acute hypercarbic and hypoxemic respiratory failure in setting of bilateral pulmonary infiltrates. Suspect CHF vs COPD exacerbation related to aspiration event on 7/7. Failed weaning trial this AM, but was able to maintain adequate volumes when PS increased to 12.  COPD  P:   -resume full vent support -ABG @ 1200 -Follow ABG and PCXR in AM -Daily SBT -Wean sedation, see neuro -Continue BD -Taper solumedrol -See cards  section  CARDIOVASCULAR  Recent Labs Lab 11/09/12 1827 11/10/12 0030  PROBNP 1423.0* 2952.0*    Recent Labs Lab 11/10/12 0030 11/10/12 0700 11/10/12 1330  TROPONINI <0.30 <0.30 <0.30   A:  Acute decompensated CHF - previously diuresed to maintain CVP <10. CVP 12 this AM but labs suggest sufficient diureses. HOCM Afib with RVR - HR on my exam 130-145, takes amiodarone, lopressor, and norvasc at home. History of noncompliance  P:  - Increase IV lopressor to 5 mg Q6H - Maintain euvolemia to -1L fluid balance - Cardiology following - Check ProBNP  RENAL  Recent Labs Lab 11/10/12 0700 11/11/12 0345 11/12/12 0400 11/13/12 0330 11/14/12 0505 11/15/12 0500 11/16/12 0440  NA 139 139 139 141 147* 151* 153*  K 3.9 4.3 3.7 3.4* 4.0 3.0* 4.8  CL 107 109 110 110 114* 118* 120*  CO2 22 23 22 21 21 24 28   GLUCOSE 110* 148* 153* 263* 227* 134* 225*  BUN 22 37* 47* 60* 57* 65* 87*  CREATININE 2.33* 2.43* 2.58* 2.46* 2.35* 2.15* 2.40*  CALCIUM 10.4 10.8* 10.5 11.4* 12.4* 12.8* 13.3*  MG 1.9 1.8 1.9 2.1 2.3  --  2.5  PHOS 4.5  --   --   --  3.2  --  3.0   Intake/Output     07/09 0701 - 07/10 0700 07/10 0701 - 07/11 0700   I.V. (mL/kg) 545.3 (6.9)    NG/GT 700    IV Piggyback 250    Total Intake(mL/kg) 1495.3 (19)    Urine (mL/kg/hr) 1395 (0.7)    Total Output 1395     Net +100.3           A:   AKI - FENa suggested prerenal in origin, however, patient received several doses of lasix making this inaccurate. CKD  11/15/12 Mild hypernatremia due to lasi ad AKI with CRI P:   - Ensure renal perfusion with MAP >60 - KVO IVF - Add free water flushes - monitor UOP  - Maintain euvolemia to -1L volume status - repeat BMET, and Mag in AM - replete electrolytes as indicated   GASTROINTESTINAL  Recent Labs Lab 11/09/12 1828  11/12/12 0400 11/13/12 0330 11/14/12 0505 11/15/12 0500 11/16/12 0440  AST 26  --   --   --   --   --   --   ALT 15  --   --   --   --   --    --   ALKPHOS 195*  --   --   --   --   --   --   BILITOT 0.4  --   --   --   --   --   --   PROT 7.5  --   --   --   --   --   --  ALBUMIN 3.3*  --   --   --   --   --   --   INR  --   < > 2.11* 2.72* 2.69* 3.31* 3.32*  < > = values in this interval not displayed.  A:  No acute issues P:   -Protonix for SUP -Continue enteral feeds -Free water flushes as ordered  HEMATOLOGIC  Recent Labs Lab 11/14/12 0505 11/15/12 0500 11/15/12 2330  HGB 8.5* 8.2* 8.6*  HCT 26.6* 25.6* 27.7*  WBC 17.4* 12.5* 12.5*  PLT 297 241 271    Recent Labs Lab 11/12/12 0400 11/13/12 0330 11/14/12 0505 11/15/12 0500 11/16/12 0440  INR 2.11* 2.72* 2.69* 3.31* 3.32*    A:  Need for long term anticoagulation - warfarin for A-fib  Anemia P:  -warfarin per pharmacy consult -Daily INR until therapeutic -Packed red blood cells for hemoglobin less than 7 g percent only in the setting of anemia critical illness  INFECTIOUS  Recent Labs Lab 11/12/12 1930 11/13/12 0330 11/14/12 0505 11/15/12 0500 11/15/12 2330  WBC 9.3 9.9 17.4* 12.5* 12.5*    Recent Labs Lab 11/09/12 1829  11/10/12 0030 11/10/12 0830 11/14/12 0950 11/15/12 0500 11/16/12 0440  LATICACIDVEN 2.3*  --  2.0 0.9  --   --   --   PROCALCITON  --   < > 13.47  --  3.42 2.53 1.65  < > = values in this interval not displayed.  A:  Likely pneumonia-aspiration versus hospital acquired. - PCT positive now improved. WBC improved from 17 to 12 High Risk for MRSA - PCR positive  P:   - ABX as above - Follow cultures - Follow WBC and fever curve  ENDOCRINE CBG (last 3)   Recent Labs  11/15/12 2004 11/15/12 2324 11/16/12 0400  GLUCAP 250* 186* 198*   A:  DM2 P:   -ICU hyperglycemia protocol  NEUROLOGIC A:  Acute Encephalopathy - related to acute illness vs medication administration  P:   - Wean fentanyl infusion to 25 mcq - Add precedex if anxious - RASS goal -1-0 - Supportive care  Global 11/14/2012 and  11/15/12 and 710/14: Family not at bedside     Richard A Rosebrock, S-ACNP and Nelva Bush, FNP      STAFF NOTE: I, Dr Lavinia Sharps have personally reviewed patient's available data, including medical history, events of note, physical examination and test results as part of my evaluation. I have discussed with resident/NP and other care providers such as pharmacist, RN and RRT.  In addition,  I personally evaluated patient and elicited key findings of acute respiratory failure with second time reintubation due to aspiration. Today she failed spontaneous breathing trial. In addition after he cut down on Lopressor yesterday though we were able to get off Neo-Synephrine for a to fibrillation with rapid ventricular rate is worse. So we will increase Lopressor. Continue full mechanical ventilatory support. I'm concerned that she might be heading towards tracheostomy.  Rest per NP/medical resident whose note is outlined above and that I agree with  The patient is critically ill with multiple organ systems failure and requires high complexity decision making for assessment and support, frequent evaluation and titration of therapies, application of advanced monitoring technologies and extensive interpretation of multiple databases.   Critical Care Time devoted to patient care services described in this note is  35  Minutes independent of nurse practitioner time.  Dr. Kalman Shan, M.D., [facility].C.P Pulmonary and Critical Care Medicine Staff Physician [facility] [place]  Pulmonary and Critical Care Pager: [phone], If no answer or between  15:00h - 7:00h: call [phone]  11/16/2012 9:58 AM

## 2012-11-16 NOTE — Progress Notes (Signed)
Attempted SBT 5ps/5peep.  Pt spont vT approx 100 ml.  Pt is still sedated but arousable.  Increased Ps to 10, then 12 for vT 400-550 ml.  RN reports plans to wean sedation levels this morning.  Will attempt to wean PS if able. No distress noted.  Pt appears comfortable on PSV.

## 2012-11-16 NOTE — Progress Notes (Signed)
ANTICOAGULATION/ CONSULT NOTE - Follow Up Consult  Pharmacy Consult for Coumadin  Indication: Hx afib   No Known Allergies  Patient Measurements: Height: 5\' 2"  (157.5 cm) Weight: 173 lb 4.5 oz (78.6 kg) IBW/kg (Calculated) : 50.1  Vital Signs: Temp: 98.6 F (37 C) (07/10 0500) Temp src: Core (Comment) (07/10 0400) BP: 147/75 mmHg (07/10 0738) Pulse Rate: 91 (07/10 0738)  Labs:  Recent Labs  11/14/12 0505 11/15/12 0500 11/15/12 2330 11/16/12 0440  HGB 8.5* 8.2* 8.6*  --   HCT 26.6* 25.6* 27.7*  --   PLT 297 241 271  --   LABPROT 27.7* 32.4*  --  32.5*  INR 2.69* 3.31*  --  3.32*  CREATININE 2.35* 2.15*  --  2.40*    Estimated Creatinine Clearance: 20.6 ml/min (by C-G formula based on Cr of 2.4).  Assessment: 72yof continues on coumadin for hx afib. INR is above goal today (INR= 3.32) with trend up (possibly due to HF, nutritional status and antibiotics).   Home dose: 2.5mg  daily except 5mg  on Mondays.  Goal of Therapy:  INR 2-3 Monitor platelets by anticoagulation protocol: Yes   Plan:  -Hold coumadin today -Daily PT/INR  Harland German, Pharm D 11/16/2012 8:12 AM

## 2012-11-16 NOTE — Progress Notes (Signed)
Pt changed to prvc d/t increased HR.  Dr Marchelle Gearing aware

## 2012-11-16 NOTE — Progress Notes (Signed)
SUBJECTIVE:  Sedated.  Opens eyes.    PHYSICAL EXAM Filed Vitals:   11/16/12 0920 11/16/12 1000 11/16/12 1100 11/16/12 1200  BP: 149/92 134/101  93/68  Pulse: 149 108 85 65  Temp: 99 F (37.2 C) 98.8 F (37.1 C) 98.6 F (37 C) 98.6 F (37 C)  TempSrc:      Resp: 20 19 20 20   Height:      Weight:      SpO2: 97% 91% 98% 97%   General:  No acute distress Lungs:  No wheezing, no crackles Heart:  RRR, systolic murmur Abdomen:  Positive bowel sounds, no rebound no guarding Extremities:  No edema  LABS:  Results for orders placed during the hospital encounter of 11/09/12 (from the past 24 hour(s))  GLUCOSE, CAPILLARY     Status: Abnormal   Collection Time    11/15/12  5:42 PM      Result Value Range   Glucose-Capillary 215 (*) 70 - 99 mg/dL  GLUCOSE, CAPILLARY     Status: Abnormal   Collection Time    11/15/12  8:04 PM      Result Value Range   Glucose-Capillary 250 (*) 70 - 99 mg/dL  GLUCOSE, CAPILLARY     Status: Abnormal   Collection Time    11/15/12 11:24 PM      Result Value Range   Glucose-Capillary 186 (*) 70 - 99 mg/dL  CBC     Status: Abnormal   Collection Time    11/15/12 11:30 PM      Result Value Range   WBC 12.5 (*) 4.0 - 10.5 K/uL   RBC 3.37 (*) 3.87 - 5.11 MIL/uL   Hemoglobin 8.6 (*) 12.0 - 15.0 g/dL   HCT 78.2 (*) 95.6 - 21.3 %   MCV 82.2  78.0 - 100.0 fL   MCH 25.5 (*) 26.0 - 34.0 pg   MCHC 31.0  30.0 - 36.0 g/dL   RDW 08.6 (*) 57.8 - 46.9 %   Platelets 271  150 - 400 K/uL  GLUCOSE, CAPILLARY     Status: Abnormal   Collection Time    11/16/12  4:00 AM      Result Value Range   Glucose-Capillary 198 (*) 70 - 99 mg/dL  PROTIME-INR     Status: Abnormal   Collection Time    11/16/12  4:40 AM      Result Value Range   Prothrombin Time 32.5 (*) 11.6 - 15.2 seconds   INR 3.32 (*) 0.00 - 1.49  BASIC METABOLIC PANEL     Status: Abnormal   Collection Time    11/16/12  4:40 AM      Result Value Range   Sodium 153 (*) 135 - 145 mEq/L   Potassium 4.8  3.5 - 5.1 mEq/L   Chloride 120 (*) 96 - 112 mEq/L   CO2 28  19 - 32 mEq/L   Glucose, Bld 225 (*) 70 - 99 mg/dL   BUN 87 (*) 6 - 23 mg/dL   Creatinine, Ser 6.29 (*) 0.50 - 1.10 mg/dL   Calcium 52.8 (*) 8.4 - 10.5 mg/dL   GFR calc non Af Amer 19 (*) >90 mL/min   GFR calc Af Amer 22 (*) >90 mL/min  MAGNESIUM     Status: None   Collection Time    11/16/12  4:40 AM      Result Value Range   Magnesium 2.5  1.5 - 2.5 mg/dL  PHOSPHORUS     Status: None  Collection Time    11/16/12  4:40 AM      Result Value Range   Phosphorus 3.0  2.3 - 4.6 mg/dL  PROCALCITONIN     Status: None   Collection Time    11/16/12  4:40 AM      Result Value Range   Procalcitonin 1.65    GLUCOSE, CAPILLARY     Status: Abnormal   Collection Time    11/16/12  7:59 AM      Result Value Range   Glucose-Capillary 219 (*) 70 - 99 mg/dL  GLUCOSE, CAPILLARY     Status: Abnormal   Collection Time    11/16/12 12:42 PM      Result Value Range   Glucose-Capillary 251 (*) 70 - 99 mg/dL    Intake/Output Summary (Last 24 hours) at 11/16/12 1315 Last data filed at 11/16/12 1200  Gross per 24 hour  Intake 1722.5 ml  Output   1945 ml  Net -222.5 ml    ASSESSMENT AND PLAN:  Acute on chronic diastolic congestive heart failure:  CVP 10.    Creat up.  I agree with holding Lasix today.  CHRONIC KIDNEY DISEASE STAGE V:  Creat is up slightly.   FIBRILLATION, ATRIAL:  Afib with IV lopressor for rate control.  Continue oral amiodarone.  I do not think that the fact that she is in this rhythm is contributing to the failure to wean as long as there rate is controlled.  Agree with IV metoprolol. On warfarin per pharmacy.    Acute respiratory failure with hypoxia:  Plan per CCM.      [name] 11/16/2012 1:15 PM

## 2012-11-16 NOTE — Progress Notes (Signed)
CRITICAL VALUE ALERT  Critical value received:  Calcium  Date of notification: 11/16/12  Time of notification: 0545  Critical value read back:yes    Nurse who received alert:  Exie Parody  MD notified (1st page):  Dr. Tyson Alias  Time of first page:  0550  Time MD responded:  0550  D5W @ 50 mL/hr ordered.

## 2012-11-17 ENCOUNTER — Inpatient Hospital Stay (HOSPITAL_COMMUNITY): Payer: Medicare Other

## 2012-11-17 LAB — COMPREHENSIVE METABOLIC PANEL
BUN: 77 mg/dL — ABNORMAL HIGH (ref 6–23)
CO2: 26 mEq/L (ref 19–32)
Chloride: 122 mEq/L — ABNORMAL HIGH (ref 96–112)
Creatinine, Ser: 2.09 mg/dL — ABNORMAL HIGH (ref 0.50–1.10)
GFR calc Af Amer: 26 mL/min — ABNORMAL LOW (ref >90)
GFR calc non Af Amer: 23 mL/min — ABNORMAL LOW (ref >90)
Glucose, Bld: 181 mg/dL — ABNORMAL HIGH (ref 70–99)
Total Bilirubin: 0.3 mg/dL (ref 0.3–1.2)

## 2012-11-17 LAB — CBC
MCV: 81.7 fL (ref 78.0–100.0)
Platelets: 264 10*3/uL (ref 150–400)
RDW: 18.4 % — ABNORMAL HIGH (ref 11.5–15.5)
WBC: 14.4 10*3/uL — ABNORMAL HIGH (ref 4.0–10.5)

## 2012-11-17 LAB — CULTURE, BAL-QUANTITATIVE W GRAM STAIN

## 2012-11-17 LAB — BASIC METABOLIC PANEL
BUN: 80 mg/dL — ABNORMAL HIGH (ref 6–23)
Calcium: 13.8 mg/dL (ref 8.4–10.5)
GFR calc non Af Amer: 20 mL/min — ABNORMAL LOW (ref >90)
Glucose, Bld: 215 mg/dL — ABNORMAL HIGH (ref 70–99)
Sodium: 158 mEq/L — ABNORMAL HIGH (ref 135–145)

## 2012-11-17 LAB — GLUCOSE, CAPILLARY
Glucose-Capillary: 158 mg/dL — ABNORMAL HIGH (ref 70–99)
Glucose-Capillary: 249 mg/dL — ABNORMAL HIGH (ref 70–99)
Glucose-Capillary: 204 mg/dL — ABNORMAL HIGH (ref 70–99)
Glucose-Capillary: 215 mg/dL — ABNORMAL HIGH (ref 70–99)

## 2012-11-17 LAB — UREA NITROGEN, URINE: Urea Nitrogen, Ur: 1065 mg/dL

## 2012-11-17 LAB — PRO B NATRIURETIC PEPTIDE: Pro B Natriuretic peptide (BNP): 7088 pg/mL — ABNORMAL HIGH (ref 0–125)

## 2012-11-17 LAB — APTT: aPTT: 28 seconds (ref 24–37)

## 2012-11-17 LAB — MAGNESIUM: Magnesium: 2.8 mg/dL — ABNORMAL HIGH (ref 1.5–2.5)

## 2012-11-17 LAB — POCT I-STAT 3, ART BLOOD GAS (G3+)
Bicarbonate: 27.5 mEq/L — ABNORMAL HIGH (ref 20.0–24.0)
Patient temperature: 36.6
TCO2: 29 mmol/L (ref 0–100)
pH, Arterial: 7.505 — ABNORMAL HIGH (ref 7.350–7.450)

## 2012-11-17 MED ORDER — IPRATROPIUM BROMIDE 0.02 % IN SOLN
0.5000 mg | Freq: Three times a day (TID) | RESPIRATORY_TRACT | Status: DC
  Administered 2012-11-17 – 2012-11-20 (×8): 0.5 mg via RESPIRATORY_TRACT
  Filled 2012-11-17 (×8): qty 2.5

## 2012-11-17 MED ORDER — VANCOMYCIN HCL IN DEXTROSE 750-5 MG/150ML-% IV SOLN
750.0000 mg | INTRAVENOUS | Status: DC
  Administered 2012-11-17 – 2012-11-23 (×7): 750 mg via INTRAVENOUS
  Filled 2012-11-17 (×8): qty 150

## 2012-11-17 MED ORDER — METOPROLOL TARTRATE 1 MG/ML IV SOLN
5.0000 mg | Freq: Four times a day (QID) | INTRAVENOUS | Status: DC
  Administered 2012-11-18 – 2012-11-20 (×10): 5 mg via INTRAVENOUS
  Filled 2012-11-17 (×13): qty 5

## 2012-11-17 MED ORDER — AMIODARONE HCL IN DEXTROSE 360-4.14 MG/200ML-% IV SOLN
30.0000 mg/h | INTRAVENOUS | Status: DC
  Administered 2012-11-17: 30 mg/h via INTRAVENOUS
  Administered 2012-11-18: 60 mg/h via INTRAVENOUS
  Administered 2012-11-18 – 2012-11-20 (×6): 30 mg/h via INTRAVENOUS
  Filled 2012-11-17 (×17): qty 200

## 2012-11-17 MED ORDER — METOPROLOL TARTRATE 12.5 MG HALF TABLET
12.5000 mg | ORAL_TABLET | Freq: Two times a day (BID) | ORAL | Status: DC
  Administered 2012-11-17: 12.5 mg via ORAL
  Filled 2012-11-17 (×2): qty 1

## 2012-11-17 MED ORDER — METOPROLOL TARTRATE 1 MG/ML IV SOLN
5.0000 mg | Freq: Once | INTRAVENOUS | Status: AC
  Administered 2012-11-17: 5 mg via INTRAVENOUS

## 2012-11-17 MED ORDER — FREE WATER
250.0000 mL | Status: DC
  Administered 2012-11-17 – 2012-11-20 (×27): 250 mL

## 2012-11-17 NOTE — Progress Notes (Signed)
CONSULT NOTE - Follow Up Consult  Pharmacy Consult for Coumadin, vancomycin, unasyn Indication: Hx afib, PNA  No Known Allergies  Patient Measurements: Height: 5\' 2"  (157.5 cm) Weight: 167 lb 12.3 oz (76.1 kg) IBW/kg (Calculated) : 50.1  Vital Signs: Temp: 99.5 F (37.5 C) (07/11 1116) Temp src: Core (Comment) (07/11 0000) BP: 122/73 mmHg (07/11 1116) Pulse Rate: 122 (07/11 1116)  Labs:  Recent Labs  11/15/12 0500 11/15/12 2330 11/16/12 0440 11/17/12 0425  HGB 8.2* 8.6*  --  8.9*  HCT 25.6* 27.7*  --  28.1*  PLT 241 271  --  264  APTT  --   --   --  28  LABPROT 32.4*  --  32.5* 33.6*  INR 3.31*  --  3.32* 3.47*  CREATININE 2.15*  --  2.40* 2.09*    Estimated Creatinine Clearance: 23.2 ml/min (by C-G formula based on Cr of 2.09).  Assessment: 72yof continues on coumadin for hx afib. INR is above goal today (INR= 3.47) with trend up (possibly due to HF, nutritional status and antibiotics).   Home dose: 2.5mg  daily except 5mg  on Mondays.   Patient is also on Unasyn and vancomycin for PNA.  SCr= 2.09 and CrCl ~25.  A vancomycin trough today was 26.1.  The morning dose has not been given.  CTX 7/3 >>7/6 LVQ 7/4 >>7/7 Vanc 7/4 >>7/6, add 7/8>> VT= 26.1 on 7/11 Unasyn 7/7 (aspiration)>>  7/4 BCx >> neg 7/3 UCx >> negative final 7/8 BAL>> candida   Goal of Therapy:  INR 2-3 Monitor platelets by anticoagulation protocol: Yes   Plan:  -Hold coumadin today -Daily PT/INR -Change vancomycin to 750mg  IV q24hr -Could consider d/c vancomycin? -Will recheck a vancomycin trough at steady state if therapy continues  Harland German, Pharm D 11/17/2012 11:21 AM

## 2012-11-17 NOTE — Progress Notes (Signed)
SUBJECTIVE:  Awake.  Somewhat agitated.  Failed weaning again today.  She denies pain.    PHYSICAL EXAM Filed Vitals:   11/17/12 1000 11/17/12 1116 11/17/12 1200 11/17/12 1539  BP: 142/99 122/73 91/57 129/71  Pulse: 164 122 90 122  Temp: 99 F (37.2 C) 99.5 F (37.5 C) 99.5 F (37.5 C) 99.1 F (37.3 C)  TempSrc:      Resp: 26 24 24 20   Height:      Weight:      SpO2: 100% 100% 100% 99%   General:  No acute distress Lungs:  No wheezing, no crackles Heart:  Irregular systolic murmur Abdomen:  Positive bowel sounds, no rebound no guarding Extremities:  No edema  LABS:  Results for orders placed during the hospital encounter of 11/09/12 (from the past 24 hour(s))  GLUCOSE, CAPILLARY     Status: Abnormal   Collection Time    11/16/12  8:29 PM      Result Value Range   Glucose-Capillary 194 (*) 70 - 99 mg/dL  GLUCOSE, CAPILLARY     Status: Abnormal   Collection Time    11/16/12 11:36 PM      Result Value Range   Glucose-Capillary 178 (*) 70 - 99 mg/dL   Comment 1 Notify RN    GLUCOSE, CAPILLARY     Status: Abnormal   Collection Time    11/17/12  4:22 AM      Result Value Range   Glucose-Capillary 158 (*) 70 - 99 mg/dL   Comment 1 Notify RN     Comment 2 Documented in Chart    PROTIME-INR     Status: Abnormal   Collection Time    11/17/12  4:25 AM      Result Value Range   Prothrombin Time 33.6 (*) 11.6 - 15.2 seconds   INR 3.47 (*) 0.00 - 1.49  COMPREHENSIVE METABOLIC PANEL     Status: Abnormal   Collection Time    11/17/12  4:25 AM      Result Value Range   Sodium 158 (*) 135 - 145 mEq/L   Potassium 3.6  3.5 - 5.1 mEq/L   Chloride 122 (*) 96 - 112 mEq/L   CO2 26  19 - 32 mEq/L   Glucose, Bld 181 (*) 70 - 99 mg/dL   BUN 77 (*) 6 - 23 mg/dL   Creatinine, Ser 1.61 (*) 0.50 - 1.10 mg/dL   Calcium 09.6 (*) 8.4 - 10.5 mg/dL   Total Protein 6.4  6.0 - 8.3 g/dL   Albumin 2.7 (*) 3.5 - 5.2 g/dL   AST 13  0 - 37 U/L   ALT 12  0 - 35 U/L   Alkaline Phosphatase  102  39 - 117 U/L   Total Bilirubin 0.3  0.3 - 1.2 mg/dL   GFR calc non Af Amer 23 (*) >90 mL/min   GFR calc Af Amer 26 (*) >90 mL/min  MAGNESIUM     Status: Abnormal   Collection Time    11/17/12  4:25 AM      Result Value Range   Magnesium 2.8 (*) 1.5 - 2.5 mg/dL  CBC     Status: Abnormal   Collection Time    11/17/12  4:25 AM      Result Value Range   WBC 14.4 (*) 4.0 - 10.5 K/uL   RBC 3.44 (*) 3.87 - 5.11 MIL/uL   Hemoglobin 8.9 (*) 12.0 - 15.0 g/dL   HCT 04.5 (*) 40.9 -  46.0 %   MCV 81.7  78.0 - 100.0 fL   MCH 25.9 (*) 26.0 - 34.0 pg   MCHC 31.7  30.0 - 36.0 g/dL   RDW 78.2 (*) 95.6 - 21.3 %   Platelets 264  150 - 400 K/uL  PRO B NATRIURETIC PEPTIDE     Status: Abnormal   Collection Time    11/17/12  4:25 AM      Result Value Range   Pro B Natriuretic peptide (BNP) 7088.0 (*) 0 - 125 pg/mL  APTT     Status: None   Collection Time    11/17/12  4:25 AM      Result Value Range   aPTT 28  24 - 37 seconds  POCT I-STAT 3, BLOOD GAS (G3+)     Status: Abnormal   Collection Time    11/17/12  4:31 AM      Result Value Range   pH, Arterial 7.505 (*) 7.350 - 7.450   pCO2 arterial 34.6 (*) 35.0 - 45.0 mmHg   pO2, Arterial 83.0  80.0 - 100.0 mmHg   Bicarbonate 27.5 (*) 20.0 - 24.0 mEq/L   TCO2 29  0 - 100 mmol/L   O2 Saturation 97.0     Acid-Base Excess 4.0 (*) 0.0 - 2.0 mmol/L   Patient temperature 36.6 C     Collection site ARTERIAL LINE     Drawn by Nurse     Sample type ARTERIAL    GLUCOSE, CAPILLARY     Status: Abnormal   Collection Time    11/17/12  8:20 AM      Result Value Range   Glucose-Capillary 249 (*) 70 - 99 mg/dL  VANCOMYCIN, TROUGH     Status: Abnormal   Collection Time    11/17/12  9:30 AM      Result Value Range   Vancomycin Tr 26.1 (*) 10.0 - 20.0 ug/mL  CREATININE, URINE, RANDOM     Status: None   Collection Time    11/17/12 11:43 AM      Result Value Range   Creatinine, Urine 61.10    OSMOLALITY, URINE     Status: None   Collection Time     11/17/12 11:43 AM      Result Value Range   Osmolality, Ur 557  390 - 1090 mOsm/kg  GLUCOSE, CAPILLARY     Status: Abnormal   Collection Time    11/17/12 12:00 PM      Result Value Range   Glucose-Capillary 204 (*) 70 - 99 mg/dL  BASIC METABOLIC PANEL     Status: Abnormal   Collection Time    11/17/12  2:30 PM      Result Value Range   Sodium 158 (*) 135 - 145 mEq/L   Potassium 3.2 (*) 3.5 - 5.1 mEq/L   Chloride 123 (*) 96 - 112 mEq/L   CO2 25  19 - 32 mEq/L   Glucose, Bld 215 (*) 70 - 99 mg/dL   BUN 80 (*) 6 - 23 mg/dL   Creatinine, Ser 0.86 (*) 0.50 - 1.10 mg/dL   Calcium 57.8 (*) 8.4 - 10.5 mg/dL   GFR calc non Af Amer 20 (*) >90 mL/min   GFR calc Af Amer 23 (*) >90 mL/min    Intake/Output Summary (Last 24 hours) at 11/17/12 1700 Last data filed at 11/17/12 1436  Gross per 24 hour  Intake 1737.5 ml  Output   1375 ml  Net  362.5 ml  ASSESSMENT AND PLAN:  Acute on chronic diastolic congestive heart failure:  CVP 7 - 8.    Creat up.  I agree with holding Lasix still.  CHRONIC KIDNEY DISEASE STAGE V:  Creat is up slightly.   FIBRILLATION, ATRIAL:  Afib with IV lopressor for rate control.  I am going to start IV amiodarone as her rate is elevated and goes up with reduction of her sedation or stimulation.     Acute respiratory failure with hypoxia:  Plan per CCM.  She might need a trach.     [name] 11/17/2012 5:00 PM

## 2012-11-17 NOTE — Progress Notes (Signed)
PULMONARY  / CRITICAL CARE MEDICINE  Name: [name] MRN: [id number] DOB: 06-24-1940    ADMISSION DATE:  11/09/2012 CONSULTATION DATE:  11/09/2012  REFERRING MD :  Preston Fleeting PRIMARY SERVICE: PCCM  CHIEF COMPLAINT:  Shortness of breath  BRIEF PATIENT DESCRIPTION: 72 y/o female with CHF was admitted on 7/3 from the [facility] ED with acute hypoxemic respiratory failure due to a CHF exacerbation, ? Superimposed PNA  LINES / TUBES: 7/3 ETT >>11/13/12, 11/13/12  (failed extubation immediately, ? aspirated) >> 7/3 Foley >>  7/4 CVC R IJ >>  7/5 A-line >>  CULTURES: 7/3 respiratory >> 7/4 U strep >> neg 7/4 legionella >> neg 7/3 mrsa PCR >> POSITIVE 7/8 mini-BAL >> MODERATE WBC PRESENT, PREDOMINANTLY PMN  ANTIBIOTICS: 7/3 Ceftriaxone >>7/6 7/4 levofloxacin >>7/7 7/4 Vancomycin >>7/6, restarted 7/8 >> 7/7 Unasyn >>  SIGNIFICANT EVENTS / STUDIES:  7/3 admission 7/07 reintubated immediately after extubation.  ? Aspiration. ABX restarted.  on Neo-Synephrine.  7/09 Able to wean fio2 and neo. Lopressor required for rate control of afib. 7/10 Off neo, but now tachycardic.  Failed SBT this AM, but RT felt it was due to sedation.  Sedation weaned  SUBJECTIVE/OVERNIGHT/INTERVAL HX 711: ? SPURIOUS abg SHOWING Metabolic acidosis yesterday afternoon requiring bicarb X 1 AMP. Now metabolic alkalotic with worsening Na.  Unable to wean again this morning and required addition PS to maintain volumes. A Fib RVR agai an issue  VITAL SIGNS: Temp:  [97.5 F (36.4 C)-98.8 F (37.1 C)] 98.4 F (36.9 C) (07/11 0800) Pulse Rate:  [33-127] 86 (07/11 0837) Resp:  [10-26] 10 (07/11 0837) BP: (93-166)/(67-103) 166/87 mmHg (07/11 0837) SpO2:  [91 %-100 %] 100 % (07/11 0837) Arterial Line BP: (95-180)/(56-175) 124/67 mmHg (07/11 0800) FiO2 (%):  [40 %] 40 % (07/11 0837) Weight:  [76.1 kg (167 lb 12.3 oz)] 76.1 kg (167 lb 12.3 oz) (07/11 0440)  HEMODYNAMICS: CVP:  [6 mmHg-10 mmHg] 7 mmHg  VENTILATOR  SETTINGS: Vent Mode:  [-] CPAP FiO2 (%):  [40 %] 40 % Set Rate:  [20 bmp] 20 bmp Vt Set:  [500 mL] 500 mL PEEP:  [5 cmH20] 5 cmH20 Pressure Support:  [12 cmH20] 12 cmH20 Plateau Pressure:  [16 cmH20-17 cmH20] 17 cmH20  INTAKE / OUTPUT: Intake/Output     07/10 0701 - 07/11 0700 07/11 0701 - 07/12 0700   I.V. (mL/kg) 1152.5 (15.1) 30 (0.4)   NG/GT 685 20   IV Piggyback 300    Total Intake(mL/kg) 2137.5 (28.1) 50 (0.7)   Urine (mL/kg/hr) 1925 (1.1) 225 (1.2)   Total Output 1925 225   Net +212.5 -175         PHYSICAL EXAMINATION: Gen: elderly female patient in NAD on ventilator. HEENT: NCAT, PERRL, ETT in place PULM: CTA bilaterally, no wheezes or rubs. CV: Tachy, distant heart sounds, no JVD AB: BS+, soft, nontender, no hsm Ext: warm, trace edema, no clubbing, no cyanosis Derm: no rash or skin breakdown Neuro: RASS -1, moves all ext to pain, but will not follow commands  LABS:  Recent Labs Lab 11/15/12 0500 11/16/12 0440 11/17/12 0425  NA 151* 153* 158*  K 3.0* 4.8 3.6  CL 118* 120* 122*  CO2 24 28 26   BUN 65* 87* 77*  CREATININE 2.15* 2.40* 2.09*  GLUCOSE 134* 225* 181*    Recent Labs Lab 11/15/12 0500 11/15/12 2330 11/17/12 0425  HGB 8.2* 8.6* 8.9*  HCT 25.6* 27.7* 28.1*  WBC 12.5* 12.5* 14.4*  PLT 241 271 264  IMAGING 7/11 CXR - pulmonary edema bilaterally, slightly worse. Bilateral lower lobe infiltrate vs effusion. Lines in good position.  ASSESSMENT / PLAN:  PULMONARY  Recent Labs Lab 11/13/12 1637 11/14/12 0505 11/16/12 1344 11/16/12 1539 11/17/12 0431  PHART 7.236* 7.338* 7.094* 7.454* 7.505*  PCO2ART 48.2* 38.9 39.5 38.1 34.6*  PO2ART 56.0* 153.0* 76.0* 83.0 83.0  HCO3 20.4 20.3 12.1* 26.6* 27.5*  TCO2 22 21.4 13 28 29   O2SAT 82.0 99.5 89.0 97.0 97.0    A: Acute hypercarbic and hypoxemic respiratory failure in setting of bilateral pulmonary infiltrates. Suspect CHF vs COPD exacerbation related to aspiration event on 7/7. Failed  weaning trial this AM, but was able to maintain adequate volumes when PS increased to 12.  COPD  P:   -resume full vent support -ABG @ 1200 -Follow ABG and PCXR in AM -Daily SBT -Wean sedation, see neuro -Continue BD -Taper solumedrol -See cards section  CARDIOVASCULAR  Recent Labs Lab 11/17/12 0425  PROBNP 7088.0*    Recent Labs Lab 11/10/12 1330  TROPONINI <0.30   A:  Acute decompensated CHF - previously diuresed to maintain CVP <10. CVP 12 this AM but labs suggest sufficient diureses. ProBNP elevated to 7000 HOCM Afib with RVR - HR on my exam 130-145, takes amiodarone, lopressor, and norvasc at home. History of noncompliance  P:  - Continue IV lopressor - restart po lopressor - Will increase if remain tachycardic after resuming full support - Maintain euvolemia to -1L fluid balance - Cardiology following  RENAL  Recent Labs Lab 11/12/12 0400 11/13/12 0330 11/14/12 0505 11/15/12 0500 11/16/12 0440 11/17/12 0425  NA 139 141 147* 151* 153* 158*  K 3.7 3.4* 4.0 3.0* 4.8 3.6  CL 110 110 114* 118* 120* 122*  CO2 22 21 21 24 28 26   GLUCOSE 153* 263* 227* 134* 225* 181*  BUN 47* 60* 57* 65* 87* 77*  CREATININE 2.58* 2.46* 2.35* 2.15* 2.40* 2.09*  CALCIUM 10.5 11.4* 12.4* 12.8* 13.3* 14.2*  MG 1.9 2.1 2.3  --  2.5 2.8*  PHOS  --   --  3.2  --  3.0  --    Intake/Output     07/10 0701 - 07/11 0700 07/11 0701 - 07/12 0700   I.V. (mL/kg) 1152.5 (15.1) 30 (0.4)   NG/GT 685 20   IV Piggyback 300    Total Intake(mL/kg) 2137.5 (28.1) 50 (0.7)   Urine (mL/kg/hr) 1925 (1.1) 225 (1.2)   Total Output 1925 225   Net +212.5 -175         A:   AKI - FENa suggested prerenal in origin, however, patient received several doses of lasix making this inaccurate. Most likely intravascularly dry. CKD Hypercalcemia - corrected to 15.2  P:   - Check FEN urea - Consider albumin pending above? - Ensure renal perfusion with MAP >60 - KVO IVF - Increase free water  flushes - monitor UOP  - Maintain euvolemia to -1L volume status - repeat BMET, and Mag in AM - replete electrolytes as indicated   GASTROINTESTINAL  Recent Labs Lab 11/13/12 0330 11/14/12 0505 11/15/12 0500 11/16/12 0440 11/17/12 0425  AST  --   --   --   --  13  ALT  --   --   --   --  12  ALKPHOS  --   --   --   --  102  BILITOT  --   --   --   --  0.3  PROT  --   --   --   --  6.4  ALBUMIN  --   --   --   --  2.7*  INR 2.72* 2.69* 3.31* 3.32* 3.47*    A:  Protein malnutrition P:   -Protonix for SUP -Continue enteral feeds -Free water flushes as ordered  HEMATOLOGIC  Recent Labs Lab 11/15/12 0500 11/15/12 2330 11/17/12 0425  HGB 8.2* 8.6* 8.9*  HCT 25.6* 27.7* 28.1*  WBC 12.5* 12.5* 14.4*  PLT 241 271 264    Recent Labs Lab 11/13/12 0330 11/14/12 0505 11/15/12 0500 11/16/12 0440 11/17/12 0425  INR 2.72* 2.69* 3.31* 3.32* 3.47*    A:  Need for long term anticoagulation - warfarin for A-fib  Anemia P:  -warfarin per pharmacy consult -Daily INR until therapeutic -Packed red blood cells for hemoglobin less than 7 g percent only in the setting of anemia critical illness  INFECTIOUS  Recent Labs Lab 11/13/12 0330 11/14/12 0505 11/15/12 0500 11/15/12 2330 11/17/12 0425  WBC 9.9 17.4* 12.5* 12.5* 14.4*    Recent Labs Lab 11/14/12 0950 11/15/12 0500 11/16/12 0440 11/16/12 1454  LATICACIDVEN  --   --   --  1.8  PROCALCITON 3.42 2.53 1.65  --     A:  Likely pneumonia-aspiration versus hospital acquired. - PCT positive now improved. WBC now increased to 14.4 from 12.5 on 7/11 High Risk for MRSA - PCR positive  P:   - ABX as above - PCT tomorrow AM, consider elevation of ABX coverage if increased - Re-culture if febrile - Follow cultures - Follow WBC and fever curve  ENDOCRINE CBG (last 3)   Recent Labs  11/16/12 2336 11/17/12 0422 11/17/12 0820  GLUCAP 178* 158* 249*   A:  DM2 P:   -ICU hyperglycemia  protocol  NEUROLOGIC A:  Acute Encephalopathy - related to acute illness vs medication administration  P:   - Wean fentanyl infusion to 25 mcq - Add precedex if anxious - RASS goal -1-0 - Supportive care  Global 11/14/2012 - 11/17/12: Family not at bedside   Reynold Bowen, S-ACNP and Nelva Bush, FNP   STAFF NOTE: I, Dr Lavinia Sharps have personally reviewed patient's available data, including medical history, events of note, physical examination and test results as part of my evaluation. I have discussed with resident/NP and other care providers such as pharmacist, RN and RRT.  In addition,  I personally evaluated patient and elicited key findings of acute respiratory failure with second time reintubation due to aspiration. Today she failed spontaneous breathing trial. In addition after we cut down on Lopressor two days ago A Fib RVR is worse; so will restart po lopressor.  Also she has metabl alkalosis with intravascular dehdyration; Na increasing to 158. Will increase free water. Continue full mechanical ventilatory support. I'm concerned that she might be heading towards tracheostomy.  Rest per NP/medical resident whose note is outlined above and that I agree with  The patient is critically ill with multiple organ systems failure and requires high complexity decision making for assessment and support, frequent evaluation and titration of therapies, application of advanced monitoring technologies and extensive interpretation of multiple databases.   Critical Care Time devoted to patient care services described in this note is  35  Minutes independent of nurse practitioner time.  Dr. Kalman Shan, M.D., [facility].C.P Pulmonary and Critical Care Medicine Staff Physician [facility] [place] [facility] Pager: [phone], If no answer or between  15:00h - 7:00h: call [phone]  11/17/2012 9:30 AM

## 2012-11-17 NOTE — Progress Notes (Signed)
eLink Physician-Brief Progress Note Patient Name: [name] DOB: 09-Jan-1941 MRN: [id number]  Date of Service  11/17/2012   HPI/Events of Note   A/RVR  eICU Interventions   Metoprolol 5 IV now then q6h   Intervention Category Major Interventions: Arrhythmia - evaluation and management  Delories Mauri 11/17/2012, 8:58 PM

## 2012-11-17 NOTE — Progress Notes (Signed)
Placed pt back on full vent support d/t increased HR 120's and per CCM RN -Drew request.

## 2012-11-18 ENCOUNTER — Inpatient Hospital Stay (HOSPITAL_COMMUNITY): Payer: Medicare Other

## 2012-11-18 LAB — GLUCOSE, CAPILLARY
Glucose-Capillary: 197 mg/dL — ABNORMAL HIGH (ref 70–99)
Glucose-Capillary: 254 mg/dL — ABNORMAL HIGH (ref 70–99)
Glucose-Capillary: 231 mg/dL — ABNORMAL HIGH (ref 70–99)

## 2012-11-18 LAB — POCT I-STAT 3, ART BLOOD GAS (G3+)
Bicarbonate: 27.3 mEq/L — ABNORMAL HIGH (ref 20.0–24.0)
TCO2: 28 mmol/L (ref 0–100)
pCO2 arterial: 35.5 mmHg (ref 35.0–45.0)
pH, Arterial: 7.495 — ABNORMAL HIGH (ref 7.350–7.450)
pO2, Arterial: 137 mmHg — ABNORMAL HIGH (ref 80.0–100.0)

## 2012-11-18 LAB — BASIC METABOLIC PANEL
BUN: 73 mg/dL — ABNORMAL HIGH (ref 6–23)
GFR calc Af Amer: 26 mL/min — ABNORMAL LOW (ref >90)
GFR calc non Af Amer: 22 mL/min — ABNORMAL LOW (ref >90)
Potassium: 3.1 mEq/L — ABNORMAL LOW (ref 3.5–5.1)

## 2012-11-18 LAB — CBC WITH DIFFERENTIAL/PLATELET
Basophils Relative: 0 % (ref 0–1)
Eosinophils Absolute: 0.1 10*3/uL (ref 0.0–0.7)
Hemoglobin: 8.4 g/dL — ABNORMAL LOW (ref 12.0–15.0)
MCH: 25.5 pg — ABNORMAL LOW (ref 26.0–34.0)
MCHC: 30.9 g/dL (ref 30.0–36.0)
Monocytes Relative: 8 % (ref 3–12)
Neutrophils Relative %: 85 % — ABNORMAL HIGH (ref 43–77)
Platelets: 265 10*3/uL (ref 150–400)

## 2012-11-18 LAB — PROTIME-INR: Prothrombin Time: 28.9 seconds — ABNORMAL HIGH (ref 11.6–15.2)

## 2012-11-18 MED ORDER — POTASSIUM CHLORIDE 20 MEQ/15ML (10%) PO LIQD
40.0000 meq | ORAL | Status: AC
  Administered 2012-11-18 (×2): 40 meq
  Filled 2012-11-18: qty 30

## 2012-11-18 MED ORDER — WARFARIN SODIUM 2.5 MG PO TABS
2.5000 mg | ORAL_TABLET | Freq: Once | ORAL | Status: AC
  Administered 2012-11-18: 2.5 mg via ORAL
  Filled 2012-11-18: qty 1

## 2012-11-18 NOTE — Progress Notes (Signed)
PULMONARY  / CRITICAL CARE MEDICINE  Name: [name] MRN: [id number] DOB: 09-26-1940    ADMISSION DATE:  11/09/2012 CONSULTATION DATE:  11/09/2012  REFERRING MD :  Preston Fleeting PRIMARY SERVICE: PCCM  CHIEF COMPLAINT:  Shortness of breath  BRIEF PATIENT DESCRIPTION: 72 y/o female with CHF was admitted on 7/3 from the [facility] ED with acute hypoxemic respiratory failure due to a CHF exacerbation, ? Superimposed PNA  LINES / TUBES: 7/3 ETT >>11/13/12, 11/13/12  (failed extubation immediately, ? aspirated) >> 7/3 Foley >>  7/4 CVC R IJ >>  7/5 A-line >>  CULTURES: 7/3 respiratory >> 7/4 U strep >> neg 7/4 legionella >> neg 7/3 mrsa PCR >> POSITIVE 7/8 mini-BAL >> MODERATE WBC PRESENT, PREDOMINANTLY PMN. CANDIDA PRESNT     ANTIBIOTICS: 7/3 Ceftriaxone >>7/6 7/4 levofloxacin >>7/7 7/4 Vancomycin >>7/6, restarted 7/8 >> 7/7 Unasyn >>  SIGNIFICANT EVENTS / STUDIES:  7/3 admission 7/07 reintubated immediately after extubation.  ? Aspiration. ABX restarted.  on Neo-Synephrine.  7/09 Able to wean fio2 and neo. Lopressor required for rate control of afib. 7/10 Off neo, but now tachycardic.  Failed SBT this AM, but RT felt it was due to sedation.  Sedation weaned  711: ? SPURIOUS abg SHOWING Metabolic acidosis yesterday afternoon requiring bicarb X 1 AMP. Now metabolic alkalotic with worsening Na.  Unable to wean again this morning and required addition PS to maintain volumes. A Fib RVR agai an issue   SUBJECTIVE/OVERNIGHT/INTERVAL HX  11/18/12: Still having a fibrillation with rapid ventricular issues. Cardiology services increased the amiodarone infusion and are considering cardioversion. Failed spontaneous breathing trial  VITAL SIGNS: Temp:  [98.4 F (36.9 C)-99.5 F (37.5 C)] 98.4 F (36.9 C) (07/12 0300) Pulse Rate:  [38-164] 49 (07/12 0600) Resp:  [13-26] 21 (07/12 0600) BP: (81-188)/(38-106) 135/89 mmHg (07/12 0600) SpO2:  [96 %-100 %] 100 % (07/12 0741) Arterial Line BP:  (79-152)/(43-90) 121/70 mmHg (07/12 0600) FiO2 (%):  [40 %] 40 % (07/12 0757) Weight:  [75.6 kg (166 lb 10.7 oz)] 75.6 kg (166 lb 10.7 oz) (07/12 0400)  HEMODYNAMICS: CVP:  [5 mmHg-10 mmHg] 5 mmHg  VENTILATOR SETTINGS: Vent Mode:  [-] PRVC FiO2 (%):  [40 %] 40 % Set Rate:  [20 bmp] 20 bmp Vt Set:  [500 mL] 500 mL PEEP:  [5 cmH20] 5 cmH20 Pressure Support:  [10 cmH20] 10 cmH20 Plateau Pressure:  [14 cmH20-19 cmH20] 19 cmH20  INTAKE / OUTPUT: Intake/Output     07/11 0701 - 07/12 0700 07/12 0701 - 07/13 0700   I.V. (mL/kg) 974 (12.9)    NG/GT 1560    IV Piggyback 250    Total Intake(mL/kg) 2784 (36.8)    Urine (mL/kg/hr) 1800 (1)    Total Output 1800     Net +984           PHYSICAL EXAMINATION: Gen: Critically ill looking elderly female patien n ventilator. HEENT: NCAT, PERRL, ETT in place PULM: CTA bilaterally, no wheezes or rubs. CV: Tachy, distant heart sounds, no JVD AB: BS+, soft, nontender, no hsm Ext: warm, trace edema, no clubbing, no cyanosis Derm: no rash or skin breakdown Neuro: RASS +1, moves all ext to pain, but will not follow commands. On fentanyl infusion  LABS: See individual assessment and plan IMAGING Dg Chest Port 1 View  11/18/2012   *RADIOLOGY REPORT*  Clinical Data: Evaluate lungs.  PORTABLE CHEST - 1 VIEW  Comparison: 11/17/2012  Findings: Endotracheal tube, enteric tube, and right central venous catheter appear unchanged  in position.  Shallow inspiration. Cardiac enlargement with pulmonary vascular congestion.  No edema or consolidation.  No blunting of costophrenic angles.  No pneumothorax.  Tortuous aorta.  IMPRESSION: Appliances remain in satisfactory apparent location.  Cardiac enlargement with mild pulmonary vascular congestion.   Original Report Authenticated By: Burman Nieves, M.D.   Dg Chest Port 1 View  11/17/2012   *RADIOLOGY REPORT*  Clinical Data: Endotracheal tube placement  PORTABLE CHEST - 1 VIEW  Comparison: November 16, 2012.   Findings: Endotracheal tube is in grossly good position with distal tip approximately 4 cm above the carina.  Nasogastric tube passes through esophagus into stomach.  Right internal jugular catheter line is unchanged in position.  Stable cardiomediastinal silhouette.  No pneumothorax or pleural effusion is noted.  No acute pulmonary disease is noted.  IMPRESSION: Endotracheal tube in grossly good position.  No significant change compared to prior exam.   Original Report Authenticated By: Lupita Raider.,  M.D.     ASSESSMENT / PLAN:  PULMONARY  Recent Labs Lab 11/14/12 0505 11/16/12 1344 11/16/12 1539 11/17/12 0431 11/18/12 0401  PHART 7.338* 7.094* 7.454* 7.505* 7.495*  PCO2ART 38.9 39.5 38.1 34.6* 35.5  PO2ART 153.0* 76.0* 83.0 83.0 137.0*  HCO3 20.3 12.1* 26.6* 27.5* 27.3*  TCO2 21.4 13 28 29 28   O2SAT 99.5 89.0 97.0 97.0 99.0    A: Acute hypercarbic and hypoxemic respiratory failure in setting of bilateral pulmonary infiltrates. Suspect CHF vs COPD exacerbation related to aspiration event on 7/7 COPD  11/18/2012: Again failed spontaneous breathing trial  P:   -resume full vent support -Daily SBT -sedation, see neuro -Continue BD -Taper prednisone over the next one week starting 11/18/2012 - Needs tracheostomy -See cards section  CARDIOVASCULAR  Recent Labs Lab 11/17/12 0425  PROBNP 7088.0*   No results found for this basename: TROPONINI,  in the last 168 hours A:  Acute decompensated CHF - previously diuresed to maintain CVP <10. CVP 12 this AM but labs suggest sufficient diureses. ProBNP elevated to 7000 HOCM Afib with RVR - HR on my exam 130-145, takes amiodarone, lopressor, and norvasc at home. History of noncompliance  11/18/2012: Amiodarone infusion increased due to continued A. fib and rapid ventricular rate  P:  - Continue IV lopressor and oral lopressor - Continue amiodarone infusion at an increased rate per cardiology - Cardioversion timing and  decision per cardiology -- Cardiology following  RENAL  Recent Labs Lab 11/13/12 0330 11/14/12 0505 11/15/12 0500 11/16/12 0440 11/17/12 0425 11/17/12 1430 11/18/12 0400  NA 141 147* 151* 153* 158* 158* 155*  K 3.4* 4.0 3.0* 4.8 3.6 3.2* 3.1*  CL 110 114* 118* 120* 122* 123* 121*  CO2 21 21 24 28 26 25 27   GLUCOSE 263* 227* 134* 225* 181* 215* 140*  BUN 60* 57* 65* 87* 77* 80* 73*  CREATININE 2.46* 2.35* 2.15* 2.40* 2.09* 2.30* 2.13*  CALCIUM 11.4* 12.4* 12.8* 13.3* 14.2* 13.8* 13.0*  MG 2.1 2.3  --  2.5 2.8*  --  2.5  PHOS  --  3.2  --  3.0  --   --   --    Intake/Output     07/11 0701 - 07/12 0700 07/12 0701 - 07/13 0700   I.V. (mL/kg) 974 (12.9)    NG/GT 1560    IV Piggyback 250    Total Intake(mL/kg) 2784 (36.8)    Urine (mL/kg/hr) 1800 (1)    Total Output 1800     Net +984  A:   AKI - FENa suggested prerenal in origin, however, patient received several doses of lasix making this inaccurate. Most likely intravascularly dry. CKD Hypercalcemia - corrected to 15.2  11/18/2012: Hypernatremia and renal failure improving with free water  P:   - Continue free water - Monitor litesx  GASTROINTESTINAL  Recent Labs Lab 11/14/12 0505 11/15/12 0500 11/16/12 0440 11/17/12 0425 11/18/12 0400  AST  --   --   --  13  --   ALT  --   --   --  12  --   ALKPHOS  --   --   --  102  --   BILITOT  --   --   --  0.3  --   PROT  --   --   --  6.4  --   ALBUMIN  --   --   --  2.7*  --   INR 2.69* 3.31* 3.32* 3.47* 2.85*    A:  Protein malnutrition P:   -Protonix for SUP -Continue enteral feeds -Free water flushes as ordered  HEMATOLOGIC  Recent Labs Lab 11/15/12 2330 11/17/12 0425 11/18/12 0400  HGB 8.6* 8.9* 8.4*  HCT 27.7* 28.1* 27.2*  WBC 12.5* 14.4* 14.0*  PLT 271 264 265    Recent Labs Lab 11/14/12 0505 11/15/12 0500 11/16/12 0440 11/17/12 0425 11/18/12 0400  INR 2.69* 3.31* 3.32* 3.47* 2.85*    A:  Need for long term  anticoagulation - warfarin for A-fib  Anemia P:  -warfarin per pharmacy consult -Packed red blood cells for hemoglobin less than 7 g percent only in the setting of anemia critical illness  INFECTIOUS  Recent Labs Lab 11/14/12 0505 11/15/12 0500 11/15/12 2330 11/17/12 0425 11/18/12 0400  WBC 17.4* 12.5* 12.5* 14.4* 14.0*    Recent Labs Lab 11/15/12 0500 11/16/12 0440 11/16/12 1454 11/18/12 0400  LATICACIDVEN  --   --  1.8  --   PROCALCITON 2.53 1.65  --  0.46    A:  Likely pneumonia-aspiration versus hospital acquired. - PCT positive now improved. WBC now increased to 14.4 from 12.5 on 7/11 High Risk for MRSA - PCR positive  P:   - ABX as above -  ENDOCRINE CBG (last 3)   Recent Labs  11/17/12 1200 11/17/12 1708 11/17/12 1955  GLUCAP 204* 215* 206*   A:  DM2 P:   -ICU hyperglycemia protocol  NEUROLOGIC A:  Acute Encephalopathy - related to acute illness vs medication administration  11/18/2012: RASS sedation score +1. On fentanyl infusion  P:   - Wean fentanyl infusion as tolerated - Add precedex if anxious - RASS goal -1-0 - Supportive care  Global 11/14/2012 - 11/17/12: Family not at bedside 11/18/2012: Family again not at bedside. According to the nurse daughter comes in the evenings and get nursing updates    The patient is critically ill with multiple organ systems failure and requires high complexity decision making for assessment and support, frequent evaluation and titration of therapies, application of advanced monitoring technologies and extensive interpretation of multiple databases.   Critical Care Time devoted to patient care services described in this note is  35  Minutes independent of nurse practitioner time.  Dr. Kalman Shan, M.D., [facility].C.P Pulmonary and Critical Care Medicine Staff Physician [facility] [place] [facility] and Critical Care Pager: [phone], If no answer or between  15:00h - 7:00h: call [phone]  11/18/2012 8:43 AM

## 2012-11-18 NOTE — Progress Notes (Signed)
eLink Physician-Brief Progress Note Patient Name: [name] DOB: 02/01/1941 MRN: [id number]  Date of Service  11/18/2012   HPI/Events of Note  hypokalemia   eICU Interventions  Potassium replaced   Intervention Category Minor Interventions: Electrolytes abnormality - evaluation and management  DETERDING,ELIZABETH 11/18/2012, 5:05 AM

## 2012-11-18 NOTE — Progress Notes (Signed)
eLink Physician-Brief Progress Note Patient Name: [name] DOB: 1940/08/28 MRN: [id number]  Date of Service  11/18/2012   HPI/Events of Note   Pt with frequent stools  eICU Interventions  Ordered flexiseal   Intervention Category Minor Interventions: OtherSandi Carne K. 11/18/2012, 5:07 PM

## 2012-11-18 NOTE — Progress Notes (Signed)
SUBJECTIVE:  She is awake and inbutabed. She denies pain.    PHYSICAL EXAM Filed Vitals:   11/18/12 0400 11/18/12 0500 11/18/12 0600 11/18/12 0741  BP:  129/59 135/89   Pulse: 83 102 49   Temp:      TempSrc:      Resp: 22 20 21    Height:      Weight: 166 lb 10.7 oz (75.6 kg)     SpO2: 100% 100% 100% 100%   General:  No acute distress Lungs:  No wheezing, no crackles Heart:  Irregular systolic murmur Abdomen:  Positive bowel sounds, no rebound no guarding Extremities:  No edema  LABS:  Results for orders placed during the hospital encounter of 11/09/12 (from the past 24 hour(s))  GLUCOSE, CAPILLARY     Status: Abnormal   Collection Time    11/17/12  8:20 AM      Result Value Range   Glucose-Capillary 249 (*) 70 - 99 mg/dL  VANCOMYCIN, TROUGH     Status: Abnormal   Collection Time    11/17/12  9:30 AM      Result Value Range   Vancomycin Tr 26.1 (*) 10.0 - 20.0 ug/mL  UREA NITROGEN, URINE     Status: None   Collection Time    11/17/12 11:43 AM      Result Value Range   Urea Nitrogen, Ur 1065    CREATININE, URINE, RANDOM     Status: None   Collection Time    11/17/12 11:43 AM      Result Value Range   Creatinine, Urine 61.10    OSMOLALITY, URINE     Status: None   Collection Time    11/17/12 11:43 AM      Result Value Range   Osmolality, Ur 557  390 - 1090 mOsm/kg  GLUCOSE, CAPILLARY     Status: Abnormal   Collection Time    11/17/12 12:00 PM      Result Value Range   Glucose-Capillary 204 (*) 70 - 99 mg/dL  BASIC METABOLIC PANEL     Status: Abnormal   Collection Time    11/17/12  2:30 PM      Result Value Range   Sodium 158 (*) 135 - 145 mEq/L   Potassium 3.2 (*) 3.5 - 5.1 mEq/L   Chloride 123 (*) 96 - 112 mEq/L   CO2 25  19 - 32 mEq/L   Glucose, Bld 215 (*) 70 - 99 mg/dL   BUN 80 (*) 6 - 23 mg/dL   Creatinine, Ser 1.61 (*) 0.50 - 1.10 mg/dL   Calcium 09.6 (*) 8.4 - 10.5 mg/dL   GFR calc non Af Amer 20 (*) >90 mL/min   GFR calc Af Amer 23 (*) >90  mL/min  GLUCOSE, CAPILLARY     Status: Abnormal   Collection Time    11/17/12  5:08 PM      Result Value Range   Glucose-Capillary 215 (*) 70 - 99 mg/dL  GLUCOSE, CAPILLARY     Status: Abnormal   Collection Time    11/17/12  7:55 PM      Result Value Range   Glucose-Capillary 206 (*) 70 - 99 mg/dL   Comment 1 Documented in Chart     Comment 2 Notify RN    PROTIME-INR     Status: Abnormal   Collection Time    11/18/12  4:00 AM      Result Value Range   Prothrombin Time 28.9 (*) 11.6 -  15.2 seconds   INR 2.85 (*) 0.00 - 1.49  CBC WITH DIFFERENTIAL     Status: Abnormal   Collection Time    11/18/12  4:00 AM      Result Value Range   WBC 14.0 (*) 4.0 - 10.5 K/uL   RBC 3.30 (*) 3.87 - 5.11 MIL/uL   Hemoglobin 8.4 (*) 12.0 - 15.0 g/dL   HCT 16.1 (*) 09.6 - 04.5 %   MCV 82.4  78.0 - 100.0 fL   MCH 25.5 (*) 26.0 - 34.0 pg   MCHC 30.9  30.0 - 36.0 g/dL   RDW 40.9 (*) 81.1 - 91.4 %   Platelets 265  150 - 400 K/uL   Neutrophils Relative % 85 (*) 43 - 77 %   Neutro Abs 11.9 (*) 1.7 - 7.7 K/uL   Lymphocytes Relative 7 (*) 12 - 46 %   Lymphs Abs 1.0  0.7 - 4.0 K/uL   Monocytes Relative 8  3 - 12 %   Monocytes Absolute 1.1 (*) 0.1 - 1.0 K/uL   Eosinophils Relative 1  0 - 5 %   Eosinophils Absolute 0.1  0.0 - 0.7 K/uL   Basophils Relative 0  0 - 1 %   Basophils Absolute 0.0  0.0 - 0.1 K/uL  BASIC METABOLIC PANEL     Status: Abnormal   Collection Time    11/18/12  4:00 AM      Result Value Range   Sodium 155 (*) 135 - 145 mEq/L   Potassium 3.1 (*) 3.5 - 5.1 mEq/L   Chloride 121 (*) 96 - 112 mEq/L   CO2 27  19 - 32 mEq/L   Glucose, Bld 140 (*) 70 - 99 mg/dL   BUN 73 (*) 6 - 23 mg/dL   Creatinine, Ser 7.82 (*) 0.50 - 1.10 mg/dL   Calcium 95.6 (*) 8.4 - 10.5 mg/dL   GFR calc non Af Amer 22 (*) >90 mL/min   GFR calc Af Amer 26 (*) >90 mL/min  MAGNESIUM     Status: None   Collection Time    11/18/12  4:00 AM      Result Value Range   Magnesium 2.5  1.5 - 2.5 mg/dL    PROCALCITONIN     Status: None   Collection Time    11/18/12  4:00 AM      Result Value Range   Procalcitonin 0.46    POCT I-STAT 3, BLOOD GAS (G3+)     Status: Abnormal   Collection Time    11/18/12  4:01 AM      Result Value Range   pH, Arterial 7.495 (*) 7.350 - 7.450   pCO2 arterial 35.5  35.0 - 45.0 mmHg   pO2, Arterial 137.0 (*) 80.0 - 100.0 mmHg   Bicarbonate 27.3 (*) 20.0 - 24.0 mEq/L   TCO2 28  0 - 100 mmol/L   O2 Saturation 99.0     Acid-Base Excess 4.0 (*) 0.0 - 2.0 mmol/L   Patient temperature 98.4 F     Collection site ARTERIAL LINE     Drawn by RT     Sample type ARTERIAL      Intake/Output Summary (Last 24 hours) at 11/18/12 0758 Last data filed at 11/18/12 0600  Gross per 24 hour  Intake 2783.96 ml  Output   1800 ml  Net 983.96 ml    ASSESSMENT AND PLAN:  Acute on chronic diastolic congestive heart failure:  CVP 6.    Creat up.  Continuing to hold Lasix  CHRONIC KIDNEY DISEASE STAGE V:  Creat is stable.  FIBRILLATION, ATRIAL:  Afib with IV lopressor for rate control.  I started IV amiodarone as her rate is elevated and goes up with reduction of her sedation or stimulation.   I will increase this to 60 mg per hour for six hours.  She might need cardioversion if this persists.  She has been on full anticoagulation since she went back into fib.  Acute respiratory failure with hypoxia:  Plan per CCM.  She might need a trach.   Anemia:  Hgb drifting down slowly. No evidence of active bleeding.     Rollene Rotunda 11/18/2012 7:58 AM

## 2012-11-18 NOTE — Progress Notes (Signed)
[facility] ADULT ICU REPLACEMENT PROTOCOL FOR AM LAB REPLACEMENT ONLY  The patient does not apply for the [facility] Adult ICU Electrolyte Replacment Protocol based on the criteria listed below:   1. Is GFR >/= 40 ml/min? no  Patient's GFR today is 26 3. Is BUN < 60 mg/dL? no  Patient's BUN today is 73 6. If a panic level lab has been reported, has the CCM MD in charge been notified? yes.   Physician:  Dr. Helmut Muster, Namiko Pritts P 11/18/2012 5:08 AM

## 2012-11-18 NOTE — Progress Notes (Signed)
Placed pt back on full support due to HR of 179

## 2012-11-18 NOTE — Progress Notes (Signed)
ANTICOAGULATION CONSULT NOTE - Follow Up Consult  Pharmacy Consult for warfarin Indication: atrial fibrillation  No Known Allergies  Patient Measurements: Height: 5\' 2"  (157.5 cm) Weight: 166 lb 10.7 oz (75.6 kg) IBW/kg (Calculated) : 50.1   Vital Signs: Temp: 99.8 F (37.7 C) (07/12 1200) Temp src: Oral (07/12 1200) BP: 96/57 mmHg (07/12 1300) Pulse Rate: 81 (07/12 1300)  Labs:  Recent Labs  11/15/12 2330  11/16/12 0440 11/17/12 0425 11/17/12 1430 11/18/12 0400  HGB 8.6*  --   --  8.9*  --  8.4*  HCT 27.7*  --   --  28.1*  --  27.2*  PLT 271  --   --  264  --  265  APTT  --   --   --  28  --   --   LABPROT  --   --  32.5* 33.6*  --  28.9*  INR  --   --  3.32* 3.47*  --  2.85*  CREATININE  --   < > 2.40* 2.09* 2.30* 2.13*  < > = values in this interval not displayed.  Estimated Creatinine Clearance: 22.7 ml/min (by C-G formula based on Cr of 2.13).  Assessment: [name] on warfarin for AFib. Home dose is 2.5mg  daily except 5mg  on Mondays. She continues on amiodarone as well. INR today is therapeutic at 2.85. Has been elevated the past few days. No bleeding noted. Hx anemia- CBC is stable.  Goal of Therapy:  INR 2-3 Monitor platelets by anticoagulation protocol: Yes   Plan:  1. Warfarin 2.5mg  po x1 tonight 2. Daily PT/INR 3. Follow up signs/symptoms of bleeding 4. Watch CBC  Canyon Lohr D. Armie Moren, PharmD Clinical Pharmacist Pager: [phone] 11/18/2012 1:22 PM

## 2012-11-19 LAB — CBC
HCT: 25.5 % — ABNORMAL LOW (ref 36.0–46.0)
MCH: 25.6 pg — ABNORMAL LOW (ref 26.0–34.0)
MCV: 82.8 fL (ref 78.0–100.0)
Platelets: 208 10*3/uL (ref 150–400)
RBC: 3.08 MIL/uL — ABNORMAL LOW (ref 3.87–5.11)
WBC: 17.7 10*3/uL — ABNORMAL HIGH (ref 4.0–10.5)

## 2012-11-19 LAB — PROTIME-INR: INR: 2.39 — ABNORMAL HIGH (ref 0.00–1.49)

## 2012-11-19 LAB — GLUCOSE, CAPILLARY
Glucose-Capillary: 157 mg/dL — ABNORMAL HIGH (ref 70–99)
Glucose-Capillary: 129 mg/dL — ABNORMAL HIGH (ref 70–99)
Glucose-Capillary: 217 mg/dL — ABNORMAL HIGH (ref 70–99)

## 2012-11-19 LAB — BASIC METABOLIC PANEL
BUN: 65 mg/dL — ABNORMAL HIGH (ref 6–23)
CO2: 23 mEq/L (ref 19–32)
Calcium: 12.9 mg/dL — ABNORMAL HIGH (ref 8.4–10.5)
Chloride: 119 mEq/L — ABNORMAL HIGH (ref 96–112)
Creatinine, Ser: 2.18 mg/dL — ABNORMAL HIGH (ref 0.50–1.10)
Glucose, Bld: 182 mg/dL — ABNORMAL HIGH (ref 70–99)

## 2012-11-19 LAB — MAGNESIUM: Magnesium: 2.4 mg/dL (ref 1.5–2.5)

## 2012-11-19 MED ORDER — MIDAZOLAM HCL 2 MG/2ML IJ SOLN
1.0000 mg | INTRAMUSCULAR | Status: DC | PRN
  Administered 2012-11-19: 2 mg via INTRAVENOUS
  Administered 2012-11-19: 1 mg via INTRAVENOUS
  Filled 2012-11-19: qty 2

## 2012-11-19 MED ORDER — MIDAZOLAM HCL 2 MG/2ML IJ SOLN
INTRAMUSCULAR | Status: AC
  Filled 2012-11-19: qty 2

## 2012-11-19 MED ORDER — SODIUM CHLORIDE 0.9 % IV SOLN
INTRAVENOUS | Status: DC
  Administered 2012-11-19 – 2012-11-27 (×5): via INTRAVENOUS
  Administered 2012-11-27: 100 mL/h via INTRAVENOUS
  Administered 2012-11-28 (×2): via INTRAVENOUS

## 2012-11-19 MED ORDER — SODIUM CHLORIDE 0.9 % IV SOLN
INTRAVENOUS | Status: DC
  Administered 2012-11-19: 1.7 [IU]/h via INTRAVENOUS
  Administered 2012-11-19: 1.2 [IU]/h via INTRAVENOUS
  Filled 2012-11-19: qty 1

## 2012-11-19 MED ORDER — LORAZEPAM 2 MG/ML IJ SOLN
0.5000 mg | INTRAMUSCULAR | Status: DC | PRN
  Administered 2012-11-20 – 2012-11-21 (×2): 0.5 mg via INTRAVENOUS
  Filled 2012-11-19 (×3): qty 1

## 2012-11-19 MED ORDER — WARFARIN SODIUM 2.5 MG PO TABS
2.5000 mg | ORAL_TABLET | Freq: Once | ORAL | Status: AC
  Administered 2012-11-19: 2.5 mg via ORAL
  Filled 2012-11-19: qty 1

## 2012-11-19 NOTE — Progress Notes (Signed)
Notified CCM of CBG > 200 for at least two consecutive measurements. New orders received to d/c d5w gtt and hold off to start glucostabilizer. Monitor CBG's as ordered and re-evaluate need for glucostabilizer.

## 2012-11-19 NOTE — Progress Notes (Signed)
PULMONARY  / CRITICAL CARE MEDICINE  Name: [name] MRN: [id number] DOB: 05/23/1940    ADMISSION DATE:  11/09/2012 CONSULTATION DATE:  11/09/2012  REFERRING MD :  Preston Fleeting PRIMARY SERVICE: PCCM  CHIEF COMPLAINT:  Shortness of breath  BRIEF PATIENT DESCRIPTION: 72 y/o female with CHF was admitted on 7/3 from the [facility] ED with acute hypoxemic respiratory failure due to a CHF exacerbation, ? Superimposed PNA  LINES / TUBES: 7/3 ETT >>11/13/12, 11/13/12  (failed extubation immediately, ? aspirated) >> 7/3 Foley >>  7/4 CVC R IJ >>  7/5 A-line >> 11/19/2012 11/18/2012 flexiseal   CULTURES: 7/3 respiratory >> 7/4 U strep >> neg 7/4 legionella >> neg 7/3 mrsa PCR >> POSITIVE 7/8 mini-BAL >> MODERATE WBC PRESENT, PREDOMINANTLY PMN. CANDIDA PRESNT     ANTIBIOTICS: 7/3 Ceftriaxone >>7/6 7/4 levofloxacin >>7/7 7/4 Vancomycin >>7/6, restarted 7/8 >> 7/7 Unasyn >>  SIGNIFICANT EVENTS / STUDIES:  7/3 admission 7/07 reintubated immediately after extubation.  ? Aspiration. ABX restarted.  on Neo-Synephrine.  7/09 Able to wean fio2 and neo. Lopressor required for rate control of afib. 7/10 Off neo, but now tachycardic.  Failed SBT this AM, but RT felt it was due to sedation.  Sedation weaned  711: ? SPURIOUS abg SHOWING Metabolic acidosis yesterday afternoon requiring bicarb X 1 AMP. Now metabolic alkalotic with worsening Na.  Unable to wean again this morning and required addition PS to maintain volumes. A Fib RVR agai an issue  11/18/12: Still having a fibrillation with rapid ventricular issues. Cardiology services increased the amiodarone infusion and are considering cardioversion. Failed spontaneous breathing trial    SUBJECTIVE/OVERNIGHT/INTERVAL HX 11/19/2012: For cardioversion tomorrow due to persistent rapid ventricular rate from atrial fibrillation. Continued agitation   VITAL SIGNS: Temp:  [97.5 F (36.4 C)-99.8 F (37.7 C)] 99.7 F (37.6 C) (07/13 0400) Pulse Rate:   [29-129] 46 (07/13 0600) Resp:  [17-26] 21 (07/13 0600) BP: (90-177)/(53-126) 97/63 mmHg (07/13 0600) SpO2:  [92 %-100 %] 100 % (07/13 0736) Arterial Line BP: (93-149)/(54-99) 113/60 mmHg (07/13 0600) FiO2 (%):  [30 %-40 %] 30 % (07/13 0738) Weight:  [78 kg (171 lb 15.3 oz)] 78 kg (171 lb 15.3 oz) (07/13 0500)  HEMODYNAMICS: CVP:  [6 mmHg-10 mmHg] 10 mmHg  VENTILATOR SETTINGS: Vent Mode:  [-] PRVC FiO2 (%):  [30 %-40 %] 30 % Set Rate:  [20 bmp] 20 bmp Vt Set:  [500 mL] 500 mL PEEP:  [5 cmH20] 5 cmH20 Plateau Pressure:  [12 cmH20-19 cmH20] 12 cmH20  INTAKE / OUTPUT: Intake/Output     07/12 0701 - 07/13 0700 07/13 0701 - 07/14 0700   I.V. (mL/kg) 1200.6 (15.4)    NG/GT 2160    IV Piggyback 250    Total Intake(mL/kg) 3610.6 (46.3)    Urine (mL/kg/hr) 1455 (0.8)    Total Output 1455     Net +2155.6           PHYSICAL EXAMINATION: Gen: Critically ill looking elderly female patien n ventilator. HEENT: NCAT, PERRL, ETT in place PULM: CTA bilaterally, no wheezes or rubs. CV: Tachy, distant heart sounds, no JVD AB: BS+, soft, nontender, no hsm Ext: warm, trace edema, no clubbing, no cyanosis Derm: no rash or skin breakdown Neuro: RASS +2, moves all ext to pain, but will not follow commands. On fentanyl infusion  LABS: See individual assessment and plan IMAGING Dg Chest Port 1 View  11/18/2012   *RADIOLOGY REPORT*  Clinical Data: Evaluate lungs.  PORTABLE CHEST - 1 VIEW  Comparison: 11/17/2012  Findings: Endotracheal tube, enteric tube, and right central venous catheter appear unchanged in position.  Shallow inspiration. Cardiac enlargement with pulmonary vascular congestion.  No edema or consolidation.  No blunting of costophrenic angles.  No pneumothorax.  Tortuous aorta.  IMPRESSION: Appliances remain in satisfactory apparent location.  Cardiac enlargement with mild pulmonary vascular congestion.   Original Report Authenticated By: Burman Nieves, M.D.     ASSESSMENT /  PLAN:  PULMONARY  Recent Labs Lab 11/14/12 0505 11/16/12 1344 11/16/12 1539 11/17/12 0431 11/18/12 0401  PHART 7.338* 7.094* 7.454* 7.505* 7.495*  PCO2ART 38.9 39.5 38.1 34.6* 35.5  PO2ART 153.0* 76.0* 83.0 83.0 137.0*  HCO3 20.3 12.1* 26.6* 27.5* 27.3*  TCO2 21.4 13 28 29 28   O2SAT 99.5 89.0 97.0 97.0 99.0    A: Acute hypercarbic and hypoxemic respiratory failure in setting of bilateral pulmonary infiltrates. Suspect CHF vs COPD exacerbation related to aspiration event on 7/7 COPD  11/19/2012: Again failed spontaneous breathing trial due to A fib and agitatiion  P:   -full vent support -Daily SBT -sedation, see neuro -Continue BD -Taper prednisone over the next one week starting 11/18/2012 - Needs tracheostomy -See cards section  CARDIOVASCULAR  Recent Labs Lab 11/17/12 0425  PROBNP 7088.0*   No results found for this basename: TROPONINI,  in the last 168 hours A:  Acute decompensated CHF - previously diuresed to maintain CVP <10. CVP 12 this AM but labs suggest sufficient diureses. ProBNP elevated to 7000 HOCM Afib with RVR - HR on my exam 130-145, takes amiodarone, lopressor, and norvasc at home. History of noncompliance  11/18/2012: Amiodarone infusion increased due to continued A. fib and rapid ventricular rate 11/19/12: cards planning DCCV  P:  - Continue IV lopressor and oral lopressor - Continue amiodarone infusion at an increased rate per cardiology - Cardioversion timing and decision per cardiology; likely 11/20/12 -- Cardiology following;   RENAL  Recent Labs Lab 11/14/12 0505  11/16/12 0440 11/17/12 0425 11/17/12 1430 11/18/12 0400 11/19/12 0450 11/19/12 0530  NA 147*  < > 153* 158* 158* 155* QUESTIONABLE RESULTS, RECOMMEND RECOLLECT TO VERIFY 150*  K 4.0  < > 4.8 3.6 3.2* 3.1* 3.8 3.5  CL 114*  < > 120* 122* 123* 121* QUESTIONABLE RESULTS, RECOMMEND RECOLLECT TO VERIFY 119*  CO2 21  < > 28 26 25 27  QUESTIONABLE RESULTS, RECOMMEND  RECOLLECT TO VERIFY 23  GLUCOSE 227*  < > 225* 181* 215* 140* QUESTIONABLE RESULTS, RECOMMEND RECOLLECT TO VERIFY 182*  BUN 57*  < > 87* 77* 80* 73* QUESTIONABLE RESULTS, RECOMMEND RECOLLECT TO VERIFY 65*  CREATININE 2.35*  < > 2.40* 2.09* 2.30* 2.13* QUESTIONABLE RESULTS, RECOMMEND RECOLLECT TO VERIFY 2.18*  CALCIUM 12.4*  < > 13.3* 14.2* 13.8* 13.0* QUESTIONABLE RESULTS, RECOMMEND RECOLLECT TO VERIFY 12.9*  MG 2.3  --  2.5 2.8*  --  2.5 QUESTIONABLE RESULTS, RECOMMEND RECOLLECT TO VERIFY 2.4  PHOS 3.2  --  3.0  --   --   --  QUESTIONABLE RESULTS, RECOMMEND RECOLLECT TO VERIFY 3.3  < > = values in this interval not displayed. Intake/Output     07/12 0701 - 07/13 0700 07/13 0701 - 07/14 0700   I.V. (mL/kg) 1200.6 (15.4)    NG/GT 2160    IV Piggyback 250    Total Intake(mL/kg) 3610.6 (46.3)    Urine (mL/kg/hr) 1455 (0.8)    Total Output 1455     Net +2155.6           A:  AKI - FENa suggested prerenal in origin, however, patient received several doses of lasix making this inaccurate. Most likely intravascularly dry. CKD Hypercalcemia - corrected to 15.2  11/19/2012: Hypernatremia and renal failure improving with free water  P:   - Continue free water - Monitor litesx  GASTROINTESTINAL  Recent Labs Lab 11/16/12 0440 11/17/12 0425 11/18/12 0400 11/19/12 0450 11/19/12 0530  AST  --  13  --   --   --   ALT  --  12  --   --   --   ALKPHOS  --  102  --   --   --   BILITOT  --  0.3  --   --   --   PROT  --  6.4  --   --   --   ALBUMIN  --  2.7*  --   --   --   INR 3.32* 3.47* 2.85* QUESTIONABLE RESULTS, RECOMMEND RECOLLECT TO VERIFY 2.39*    A:  Protein malnutrition 11/19/12: Loose stools  P:   -Protonix for SUP -Continue enteral feeds -Free water flushes as ordered - flexiseal since 11/18/12  HEMATOLOGIC  Recent Labs Lab 11/17/12 0425 11/18/12 0400 11/19/12 0530  HGB 8.9* 8.4* 7.9*  HCT 28.1* 27.2* 25.5*  WBC 14.4* 14.0* 17.7*  PLT 264 265 208    Recent  Labs Lab 11/16/12 0440 11/17/12 0425 11/18/12 0400 11/19/12 0450 11/19/12 0530  INR 3.32* 3.47* 2.85* QUESTIONABLE RESULTS, RECOMMEND RECOLLECT TO VERIFY 2.39*    A:  Need for long term anticoagulation - warfarin for A-fib  Anemia P:  -warfarin per pharmacy consult -Packed red blood cells for hemoglobin less than 7 g percent only in the setting of anemia critical illness  INFECTIOUS  Recent Labs Lab 11/15/12 0500 11/15/12 2330 11/17/12 0425 11/18/12 0400 11/19/12 0530  WBC 12.5* 12.5* 14.4* 14.0* 17.7*    Recent Labs Lab 11/15/12 0500 11/16/12 0440 11/16/12 1454 11/18/12 0400  LATICACIDVEN  --   --  1.8  --   PROCALCITON 2.53 1.65  --  0.46    A:  Likely pneumonia-aspiration versus hospital acquired. - PCT positive now improved. WBC now increased to 14.4 from 12.5 on 7/11 High Risk for MRSA - PCR positive  P:   - ABX as above -  ENDOCRINE CBG (last 3)   Recent Labs  11/18/12 2002 11/19/12 0026 11/19/12 0409  GLUCAP 221* 173* 155*   A:  DM2 P:   -ICU hyperglycemia protocol  NEUROLOGIC A:  Acute Encephalopathy - related to acute illness vs medication administration  11/19/2012: RASS sedation score +2. On fentanyl infusion  P:   - Wean fentanyl infusion as tolerated - STart versed prn  - Add precedex if anxious - RASS goal -1 to 0 - Supportive care  Global 11/14/2012 - 11/17/12: Family not at bedside 11/18/2012: Family again not at bedside. According to the nurse daughter comes in the evenings and get nursing updates 11/19/12: FAmily again not at bedside.     The patient is critically ill with multiple organ systems failure and requires high complexity decision making for assessment and support, frequent evaluation and titration of therapies, application of advanced monitoring technologies and extensive interpretation of multiple databases.   Critical Care Time devoted to patient care services described in this note is  35  Minutes independent  of nurse practitioner time.  Dr. Kalman Shan, M.D., [facility].C.P Pulmonary and Critical Care Medicine Staff Physician [facility] [place] [facility] Pager:  [phone], If no answer or between  15:00h - 7:00h: call [phone]  11/19/2012 9:59 AM

## 2012-11-19 NOTE — Progress Notes (Signed)
ANTICOAGULATION CONSULT NOTE - Follow Up Consult  Pharmacy Consult for warfarin Indication: atrial fibrillation  No Known Allergies  Patient Measurements: Height: 5\' 2"  (157.5 cm) Weight: 171 lb 15.3 oz (78 kg) IBW/kg (Calculated) : 50.1   Vital Signs: Temp: 98.1 F (36.7 C) (07/13 0800) Temp src: Oral (07/13 0800) BP: 103/21 mmHg (07/13 1000) Pulse Rate: 123 (07/13 1000)  Labs:  Recent Labs  11/17/12 0425  11/18/12 0400 11/19/12 0450 11/19/12 0530  HGB 8.9*  --  8.4*  --  7.9*  HCT 28.1*  --  27.2*  --  25.5*  PLT 264  --  265  --  208  APTT 28  --   --   --   --   LABPROT 33.6*  --  28.9* QUESTIONABLE RESULTS, RECOMMEND RECOLLECT TO VERIFY 25.3*  INR 3.47*  --  2.85* QUESTIONABLE RESULTS, RECOMMEND RECOLLECT TO VERIFY 2.39*  CREATININE 2.09*  < > 2.13* QUESTIONABLE RESULTS, RECOMMEND RECOLLECT TO VERIFY 2.18*  < > = values in this interval not displayed.  Estimated Creatinine Clearance: 22.6 ml/min (by C-G formula based on Cr of 2.18).  Assessment: 72YOF on warfarin for AFib. Home dose is 2.5mg  daily except 5mg  on Mondays. She continues on amiodarone as well. INR today is therapeutic at 2.39. No bleeding noted. CBC dropped slightly, but relatively stable.  Goal of Therapy:  INR 2-3 Monitor platelets by anticoagulation protocol: Yes   Plan:  1. Warfarin 2.5mg  po x1 tonight 2. Daily PT/INR 3. Follow up signs/symptoms of bleeding 4. Watch CBC  Heli Dino D. Naomi Castrogiovanni, PharmD Clinical Pharmacist Pager: [phone] 11/19/2012 10:58 AM

## 2012-11-19 NOTE — Progress Notes (Signed)
SUBJECTIVE:  She is awake and inbutabed.  She is more alert today.  She denies pain.    PHYSICAL EXAM Filed Vitals:   11/19/12 0500 11/19/12 0530 11/19/12 0600 11/19/12 0736  BP:   97/63   Pulse: 92 95 46   Temp:      TempSrc:      Resp: 20 20 21    Height:      Weight: 811 lb 15.3 oz (78 kg)     SpO2: 92% 98% 95% 100%   General:  No acute distress Lungs:  No wheezing, no crackles Heart:  Irregular systolic murmur Abdomen:  Positive bowel sounds, no rebound no guarding Extremities:  No edema  LABS:  Results for orders placed during the hospital encounter of 11/09/12 (from the past 24 hour(s))  GLUCOSE, CAPILLARY     Status: Abnormal   Collection Time    11/18/12 11:52 AM      Result Value Range   Glucose-Capillary 254 (*) 70 - 99 mg/dL  GLUCOSE, CAPILLARY     Status: Abnormal   Collection Time    11/18/12  3:47 PM      Result Value Range   Glucose-Capillary 231 (*) 70 - 99 mg/dL  GLUCOSE, CAPILLARY     Status: Abnormal   Collection Time    11/18/12  8:02 PM      Result Value Range   Glucose-Capillary 221 (*) 70 - 99 mg/dL  GLUCOSE, CAPILLARY     Status: Abnormal   Collection Time    11/19/12 12:26 AM      Result Value Range   Glucose-Capillary 173 (*) 70 - 99 mg/dL  GLUCOSE, CAPILLARY     Status: Abnormal   Collection Time    11/19/12  4:09 AM      Result Value Range   Glucose-Capillary 155 (*) 70 - 99 mg/dL  PROTIME-INR     Status: None   Collection Time    11/19/12  4:50 AM      Result Value Range   Prothrombin Time    11.6 - 15.2 seconds   Value: QUESTIONABLE RESULTS, RECOMMEND RECOLLECT TO VERIFY   INR    0.00 - 1.49   Value: QUESTIONABLE RESULTS, RECOMMEND RECOLLECT TO VERIFY  BASIC METABOLIC PANEL     Status: None   Collection Time    11/19/12  4:50 AM      Result Value Range   Sodium    135 - 145 mEq/L   Value: QUESTIONABLE RESULTS, RECOMMEND RECOLLECT TO VERIFY   Potassium 3.8  3.5 - 5.1 mEq/L   Chloride    96 - 112 mEq/L   Value:  QUESTIONABLE RESULTS, RECOMMEND RECOLLECT TO VERIFY   CO2    19 - 32 mEq/L   Value: QUESTIONABLE RESULTS, RECOMMEND RECOLLECT TO VERIFY   Glucose, Bld    70 - 99 mg/dL   Value: QUESTIONABLE RESULTS, RECOMMEND RECOLLECT TO VERIFY   BUN    6 - 23 mg/dL   Value: QUESTIONABLE RESULTS, RECOMMEND RECOLLECT TO VERIFY   Creatinine, Ser    0.50 - 1.10 mg/dL   Value: QUESTIONABLE RESULTS, RECOMMEND RECOLLECT TO VERIFY   Calcium    8.4 - 10.5 mg/dL   Value: QUESTIONABLE RESULTS, RECOMMEND RECOLLECT TO VERIFY   GFR calc non Af Amer    >90 mL/min   Value: QUESTIONABLE RESULTS, RECOMMEND RECOLLECT TO VERIFY   GFR calc Af Amer    >90 mL/min   Value: QUESTIONABLE RESULTS, RECOMMEND RECOLLECT TO VERIFY  MAGNESIUM     Status: None   Collection Time    11/19/12  4:50 AM      Result Value Range   Magnesium    1.5 - 2.5 mg/dL   Value: QUESTIONABLE RESULTS, RECOMMEND RECOLLECT TO VERIFY  PHOSPHORUS     Status: None   Collection Time    11/19/12  4:50 AM      Result Value Range   Phosphorus    2.3 - 4.6 mg/dL   Value: QUESTIONABLE RESULTS, RECOMMEND RECOLLECT TO VERIFY  CBC     Status: Abnormal   Collection Time    11/19/12  5:30 AM      Result Value Range   WBC 17.7 (*) 4.0 - 10.5 K/uL   RBC 3.08 (*) 3.87 - 5.11 MIL/uL   Hemoglobin 7.9 (*) 12.0 - 15.0 g/dL   HCT 16.1 (*) 09.6 - 04.5 %   MCV 82.8  78.0 - 100.0 fL   MCH 25.6 (*) 26.0 - 34.0 pg   MCHC 31.0  30.0 - 36.0 g/dL   RDW 40.9 (*) 81.1 - 91.4 %   Platelets 208  150 - 400 K/uL  BASIC METABOLIC PANEL     Status: Abnormal   Collection Time    11/19/12  5:30 AM      Result Value Range   Sodium 150 (*) 135 - 145 mEq/L   Potassium 3.5  3.5 - 5.1 mEq/L   Chloride 119 (*) 96 - 112 mEq/L   CO2 23  19 - 32 mEq/L   Glucose, Bld 182 (*) 70 - 99 mg/dL   BUN 65 (*) 6 - 23 mg/dL   Creatinine, Ser 7.82 (*) 0.50 - 1.10 mg/dL   Calcium 95.6 (*) 8.4 - 10.5 mg/dL   GFR calc non Af Amer 21 (*) >90 mL/min   GFR calc Af Amer 25 (*) >90 mL/min    MAGNESIUM     Status: None   Collection Time    11/19/12  5:30 AM      Result Value Range   Magnesium 2.4  1.5 - 2.5 mg/dL  PHOSPHORUS     Status: None   Collection Time    11/19/12  5:30 AM      Result Value Range   Phosphorus 3.3  2.3 - 4.6 mg/dL  PROTIME-INR     Status: Abnormal   Collection Time    11/19/12  5:30 AM      Result Value Range   Prothrombin Time 25.3 (*) 11.6 - 15.2 seconds   INR 2.39 (*) 0.00 - 1.49    Intake/Output Summary (Last 24 hours) at 11/19/12 0757 Last data filed at 11/19/12 0600  Gross per 24 hour  Intake 3610.59 ml  Output   1455 ml  Net 2155.59 ml    ASSESSMENT AND PLAN:  Acute on chronic diastolic congestive heart failure:  CVP 10.    Creat up. Continuing to hold Lasix.  Getting free water for hypernatremia.   CHRONIC KIDNEY DISEASE STAGE V:  Creat is stable.  Na is slightly lower than yesterday after being given free water.    FIBRILLATION, ATRIAL:  Afib with IV lopressor for rate control.  On IV amiodarone   She has been on full anticoagulation since she went back into fib.  I will likely DCCV tomorrow.   Acute respiratory failure with hypoxia:  Plan per CCM.  She will need a trach.   Anemia:  Hgb drifting down slowly. No evidence of active bleeding.  Rollene Rotunda 11/19/2012 7:57 AM

## 2012-11-20 ENCOUNTER — Inpatient Hospital Stay (HOSPITAL_COMMUNITY): Payer: Medicare Other

## 2012-11-20 LAB — PHOSPHORUS: Phosphorus: 4 mg/dL (ref 2.3–4.6)

## 2012-11-20 LAB — BASIC METABOLIC PANEL
CO2: 24 mEq/L (ref 19–32)
Calcium: 11.5 mg/dL — ABNORMAL HIGH (ref 8.4–10.5)
Chloride: 117 mEq/L — ABNORMAL HIGH (ref 96–112)
Creatinine, Ser: 2.39 mg/dL — ABNORMAL HIGH (ref 0.50–1.10)
Glucose, Bld: 89 mg/dL (ref 70–99)

## 2012-11-20 LAB — CBC
HCT: 27.5 % — ABNORMAL LOW (ref 36.0–46.0)
Hemoglobin: 8.6 g/dL — ABNORMAL LOW (ref 12.0–15.0)
MCV: 83.8 fL (ref 78.0–100.0)
RBC: 3.28 MIL/uL — ABNORMAL LOW (ref 3.87–5.11)
RDW: 18.7 % — ABNORMAL HIGH (ref 11.5–15.5)
WBC: 16.9 10*3/uL — ABNORMAL HIGH (ref 4.0–10.5)

## 2012-11-20 LAB — GLUCOSE, CAPILLARY
Glucose-Capillary: 126 mg/dL — ABNORMAL HIGH (ref 70–99)
Glucose-Capillary: 176 mg/dL — ABNORMAL HIGH (ref 70–99)
Glucose-Capillary: 178 mg/dL — ABNORMAL HIGH (ref 70–99)
Glucose-Capillary: 194 mg/dL — ABNORMAL HIGH (ref 70–99)
Glucose-Capillary: 231 mg/dL — ABNORMAL HIGH (ref 70–99)
Glucose-Capillary: 252 mg/dL — ABNORMAL HIGH (ref 70–99)
Glucose-Capillary: 85 mg/dL (ref 70–99)
Glucose-Capillary: 139 mg/dL — ABNORMAL HIGH (ref 70–99)
Glucose-Capillary: 276 mg/dL — ABNORMAL HIGH (ref 70–99)
Glucose-Capillary: 249 mg/dL — ABNORMAL HIGH (ref 70–99)
Glucose-Capillary: 205 mg/dL — ABNORMAL HIGH (ref 70–99)
Glucose-Capillary: 190 mg/dL — ABNORMAL HIGH (ref 70–99)

## 2012-11-20 LAB — PROTIME-INR: INR: 2.59 — ABNORMAL HIGH (ref 0.00–1.49)

## 2012-11-20 LAB — FERRITIN: Ferritin: 343 ng/mL — ABNORMAL HIGH (ref 10–291)

## 2012-11-20 LAB — IRON AND TIBC
Saturation Ratios: 29 % (ref 20–55)
UIBC: 132 ug/dL (ref 125–400)

## 2012-11-20 MED ORDER — DEXTROSE 10 % IV SOLN: INTRAVENOUS | Status: DC | PRN

## 2012-11-20 MED ORDER — SODIUM CHLORIDE 0.9 % IV BOLUS (SEPSIS)
500.0000 mL | Freq: Once | INTRAVENOUS | Status: AC
  Administered 2012-11-20: 500 mL via INTRAVENOUS

## 2012-11-20 MED ORDER — POTASSIUM CHLORIDE 20 MEQ/15ML (10%) PO LIQD
40.0000 meq | Freq: Once | ORAL | Status: DC
  Filled 2012-11-20: qty 30

## 2012-11-20 MED ORDER — WARFARIN SODIUM 2.5 MG PO TABS
2.5000 mg | ORAL_TABLET | Freq: Once | ORAL | Status: AC
  Administered 2012-11-20: 2.5 mg via ORAL
  Filled 2012-11-20: qty 1

## 2012-11-20 MED ORDER — POTASSIUM CHLORIDE 20 MEQ/15ML (10%) PO LIQD
40.0000 meq | Freq: Once | ORAL | Status: AC
  Administered 2012-11-20: 40 meq
  Filled 2012-11-20: qty 30

## 2012-11-20 MED ORDER — INSULIN ASPART 100 UNIT/ML ~~LOC~~ SOLN
1.0000 [IU] | SUBCUTANEOUS | Status: DC
  Administered 2012-11-20: 1 [IU] via SUBCUTANEOUS

## 2012-11-20 MED ORDER — PREDNISONE 20 MG PO TABS
20.0000 mg | ORAL_TABLET | Freq: Every day | ORAL | Status: DC
  Administered 2012-11-21: 20 mg
  Filled 2012-11-20 (×2): qty 1

## 2012-11-20 MED ORDER — SODIUM CHLORIDE 0.9 % IV SOLN: INTRAVENOUS | Status: DC

## 2012-11-20 MED ORDER — INSULIN ASPART 100 UNIT/ML ~~LOC~~ SOLN
1.0000 [IU] | SUBCUTANEOUS | Status: DC
  Administered 2012-11-20: 3 [IU] via SUBCUTANEOUS

## 2012-11-20 MED ORDER — INSULIN GLARGINE 100 UNIT/ML ~~LOC~~ SOLN
10.0000 [IU] | SUBCUTANEOUS | Status: DC
  Administered 2012-11-20: 10 [IU] via SUBCUTANEOUS
  Filled 2012-11-20: qty 0.1

## 2012-11-20 MED ORDER — METOPROLOL TARTRATE 1 MG/ML IV SOLN
5.0000 mg | Freq: Four times a day (QID) | INTRAVENOUS | Status: DC
  Administered 2012-11-20 – 2012-11-21 (×3): 5 mg via INTRAVENOUS
  Filled 2012-11-20 (×5): qty 5

## 2012-11-20 MED ORDER — IPRATROPIUM BROMIDE 0.02 % IN SOLN
0.5000 mg | Freq: Four times a day (QID) | RESPIRATORY_TRACT | Status: DC
  Administered 2012-11-20 – 2012-11-22 (×7): 0.5 mg via RESPIRATORY_TRACT
  Filled 2012-11-20 (×6): qty 2.5

## 2012-11-20 NOTE — Progress Notes (Signed)
ANTICOAGULATION and ANTIBIOTIC CONSULT NOTE - Follow Up Consult  Pharmacy Consult for warfarin, Unasyn, vancomycin Indication: atrial fibrillation, PNA  No Known Allergies  Patient Measurements: Height: 5\' 2"  (157.5 cm) Weight: 177 lb 0.5 oz (80.3 kg) IBW/kg (Calculated) : 50.1  Vital Signs: Temp: 98.6 F (37 C) (07/14 0934) Temp src: Oral (07/14 0732) BP: 107/62 mmHg (07/14 0934) Pulse Rate: 122 (07/14 0934)  Labs:  Recent Labs  11/18/12 0400 11/19/12 0450 11/19/12 0530 11/20/12 11/20/12 0440  HGB 8.4*  --  7.9* 8.6*  --   HCT 27.2*  --  25.5* 27.5*  --   PLT 265  --  208 260  --   LABPROT 28.9* QUESTIONABLE RESULTS, RECOMMEND RECOLLECT TO VERIFY 25.3*  --  26.9*  INR 2.85* QUESTIONABLE RESULTS, RECOMMEND RECOLLECT TO VERIFY 2.39*  --  2.59*  CREATININE 2.13* QUESTIONABLE RESULTS, RECOMMEND RECOLLECT TO VERIFY 2.18*  --  2.39*    Estimated Creatinine Clearance: 20.9 ml/min (by C-G formula based on Cr of 2.39).   Medications:  Scheduled:  . ampicillin-sulbactam (UNASYN) IV  1.5 g Intravenous Q12H  . antiseptic oral rinse  15 mL Mouth Rinse BID  . atorvastatin  20 mg Oral q1800  . chlorhexidine  15 mL Mouth/Throat BID  . docusate  100 mg Per Tube BID  . feeding supplement (OSMOLITE 1.5 CAL)  1,000 mL Per Tube Q24H  . feeding supplement  30 mL Per Tube 5 X Daily  . free water  250 mL Per Tube Q3H  . ipratropium  0.5 mg Nebulization Q6H  . metoprolol  5 mg Intravenous Q6H  . multivitamin  5 mL Per Tube Daily  . pantoprazole sodium  40 mg Per Tube QHS  . polyethylene glycol  17 g Per Tube BID  . [START ON 11/21/2012] predniSONE  20 mg Per Tube Q breakfast  . sodium chloride  500 mL Intravenous Once  . vancomycin  750 mg Intravenous Q24H  . Warfarin - Pharmacist Dosing Inpatient   Does not apply q1800    Assessment: 53 YOF with hx of Afib on chronic warfarin therapy. INR remains therapeutic this morning at 2.59. NO bleeding noted, Hgb has been fluctuating, but  she has not required a transfusion since 7/6. Platelets are WNL.   She is also on Unasyn and Vancomycin for PNA.  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  Last vancomycin trough was elevated on 7/11 and dose was decreased from 1g to 750mg  IV q24. Renal function stays stable.  Goal of Therapy:  INR 2-3 Monitor platelets by anticoagulation protocol: Yes Vancomycin level 15-20   Plan:  1. Warfarin 2.5mg  PO x1 tonight 2. Daily PT/INR 3. Continue Unasyn 1.5gm IV q12h 4. Continue Vancomycin 750mg  IV q24h 5. Vanc trough at next steady state (~7/16) if clinically indicated 6. Will follow up clinical status, LOT, cultures/sensitivities, renal function  [name], PharmD Clinical Pharmacist Pager: [phone] 11/20/2012 10:39 AM

## 2012-11-20 NOTE — Progress Notes (Signed)
PULMONARY  / CRITICAL CARE MEDICINE  Name: [name] MRN: [id number] DOB: Oct 20, 1940    ADMISSION DATE:  11/09/2012 CONSULTATION DATE:  11/09/2012  REFERRING MD :  Preston Fleeting PRIMARY SERVICE: PCCM  CHIEF COMPLAINT:  Shortness of breath  BRIEF PATIENT DESCRIPTION: 72 y/o female with CHF was admitted on 7/3 from the [facility] ED with acute hypoxemic respiratory failure due to a CHF exacerbation, ? Superimposed PNA  LINES / TUBES: 7/3 ETT >>11/13/12, 11/13/12  (failed extubation immediately, ? aspirated) >> 7/4 CVC R IJ >>  7/5 A-line >>  CULTURES: 7/4 U strep >> neg 7/4 legionella >> neg 7/3 mrsa PCR >> POSITIVE 7/8 mini-BAL >> yeast  ANTIBIOTICS: 7/3 Ceftriaxone >>7/6 7/4 levofloxacin >>7/7 7/4 Vancomycin >>7/6, restarted 7/8 >> 7/7 Unasyn >>  SIGNIFICANT EVENTS: 7/3 admission 7/07 reintubated immediately after extubation.  ? Aspiration. ABX restarted.  on Neo-Synephrine.  7/09 Able to wean fio2 and neo. Lopressor required for rate control of afib. 7/10 Off neo, but now tachycardic.  Failed SBT this AM, but RT felt it was due to sedation.  Sedation weaned  STUDIES:  SUBJECTIVE: Unable to tolerate pressure support.  VITAL SIGNS: Temp:  [97.7 F (36.5 C)-99.6 F (37.6 C)] 98.6 F (37 C) (07/14 0934) Pulse Rate:  [30-160] 122 (07/14 0934) Resp:  [16-36] 27 (07/14 0934) BP: (62-150)/(21-89) 107/62 mmHg (07/14 0934) SpO2:  [92 %-100 %] 99 % (07/14 0934) Arterial Line BP: (67-212)/(40-116) 116/64 mmHg (07/14 0800) FiO2 (%):  [30 %-60 %] 40 % (07/14 0700) Weight:  [177 lb 0.5 oz (80.3 kg)] 177 lb 0.5 oz (80.3 kg) (07/14 0500)  HEMODYNAMICS: CVP:  [7 mmHg-15 mmHg] 15 mmHg  VENTILATOR SETTINGS: Vent Mode:  [-] PRVC FiO2 (%):  [30 %-60 %] 40 % Set Rate:  [20 bmp] 20 bmp Vt Set:  [500 mL] 500 mL PEEP:  [5 cmH20] 5 cmH20 Plateau Pressure:  [8 cmH20-20 cmH20] 17 cmH20  INTAKE / OUTPUT: Intake/Output     07/13 0701 - 07/14 0700 07/14 0701 - 07/15 0700   I.V. (mL/kg) 988.9  (12.3) 41.7 (0.5)   Other 50    NG/GT 1535 20   IV Piggyback 250    Total Intake(mL/kg) 2823.9 (35.2) 61.7 (0.8)   Urine (mL/kg/hr) 1550 (0.8) 250 (1.1)   Total Output 1550 250   Net +1273.9 -188.3         PHYSICAL EXAMINATION: Gen: No distress HEENT: ETT in place PULM: scattered rhonchi CV: irregular, tachycardic AB: soft, non tender Ext: no edema Derm: no rash Neuro: RASS -1, moves extremities  LABS: CBC Recent Labs     11/18/12  0400  11/19/12  0530 11/20/12  WBC  14.0*  17.7*  16.9*  HGB  8.4*  7.9*  8.6*  HCT  27.2*  25.5*  27.5*  PLT  265  208  260    Coag's Recent Labs     11/19/12  0450  11/19/12  0530  11/20/12  0440  INR  QUESTIONABLE RESULTS, RECOMMEND RECOLLECT TO VERIFY  2.39*  2.59*    BMET Recent Labs     11/19/12  0450  11/19/12  0530  11/20/12  0440  NA  QUESTIONABLE RESULTS, RECOMMEND RECOLLECT TO VERIFY  150*  149*  K  3.8  3.5  3.1*  CL  QUESTIONABLE RESULTS, RECOMMEND RECOLLECT TO VERIFY  119*  117*  CO2  QUESTIONABLE RESULTS, RECOMMEND RECOLLECT TO VERIFY  23  24  BUN  QUESTIONABLE RESULTS, RECOMMEND RECOLLECT TO VERIFY  65*  72*  CREATININE  QUESTIONABLE RESULTS, RECOMMEND RECOLLECT TO VERIFY  2.18*  2.39*  GLUCOSE  QUESTIONABLE RESULTS, RECOMMEND RECOLLECT TO VERIFY  182*  89    Electrolytes Recent Labs     11/19/12  0450  11/19/12  0530  11/20/12  0440  CALCIUM  QUESTIONABLE RESULTS, RECOMMEND RECOLLECT TO VERIFY  12.9*  11.5*  MG  QUESTIONABLE RESULTS, RECOMMEND RECOLLECT TO VERIFY  2.4  2.4  PHOS  QUESTIONABLE RESULTS, RECOMMEND RECOLLECT TO VERIFY  3.3  4.0    Sepsis Markers Recent Labs     11/18/12  0400  PROCALCITON  0.46    ABG Recent Labs     11/18/12  0401  PHART  7.495*  PCO2ART  35.5  PO2ART  137.0*   Glucose Recent Labs     11/20/12  0221  11/20/12  0319  11/20/12  0419  11/20/12  0525  11/20/12  0631  11/20/12  0731  GLUCAP  176*  126*  85  85  121*  139*    Imaging No results  found.   ASSESSMENT / PLAN:  PULMONARY A: Acute respiratory failure 2nd to pulmonary infiltrates >> CHF +/- aspiration with ?COPD. Failure to wean from ventilator. 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 A fib with RVR. Hx of CAD, HTN, hyperlipidemia. Hypotension episode 7/14. P:  -amiodarone per cardiology -plan for cardioversion per cardiology -continue lipitor -continue lopressor >> hold for SBP < 100 -fluid bolus x one 7/14, and increase IV fluid rate  RENAL A: Acute kidney injury. Hypernatremia. Hypokalemia. P:   -continue free water -monitor renal fx, urine outpt -f/u electrolytes -hold diuresis for now  GASTROINTESTINAL A:  Protein malnutrition P:   -continue tube feeds -protonix for SUP  HEMATOLOGIC A: Anemia of chronic disease and critical illness. Chronic coumadin therapy for a fib. P:  -f/u CBC -check anemia panel -transfuse for Hb < 7 -coumadin per pharmacy  INFECTIOUS A: Aspiration pneumonia. P:   -continue Abx for now  ENDOCRINE A:  DM2 P:   -SSI  NEUROLOGIC A: Acute encephalopathy 2nd to hypoxia/hypercapnia. Muscular deconditioning. P:   -limit sedation while on vent -will need PT/OT when more stable  Updated family at bedside.  They wish to continue aggressive care, including tracheostomy if needed.  CC time 35 minutes.  Coralyn Helling, MD [facility] Pulmonary/Critical Care 11/20/2012, 10:13 AM Pager:  [phone] After 3pm call: [phone]

## 2012-11-20 NOTE — Progress Notes (Signed)
Patient had a large amount of vomit. This nurse speaks to Dr. Delford Field (E-Link) with new orders to put OG tube to suction and turn off tube feeds

## 2012-11-20 NOTE — Progress Notes (Addendum)
Name: [name] MRN: [id number] DOB: June 17, 1940  ELECTRONIC ICU PHYSICIAN NOTE  Problem:  Hypokalemia    Intervention:  Kcl 40 meq x one  Sandrea Hughs 11/20/2012, 5:55 AM

## 2012-11-20 NOTE — Progress Notes (Signed)
eLink Physician-Brief Progress Note Patient Name: LARRISSA STIVERS DOB: Oct 02, 1940 MRN: [id number]  Date of Service  11/20/2012   HPI/Events of Note   Pt intolerant of Tube feeds with emesis  eICU Interventions  See orders for NG to LIS and TF held   Intervention Category Minor Interventions: Other:nausea/emesis  Shan Levans 11/20/2012, 10:26 PM

## 2012-11-20 NOTE — Progress Notes (Signed)
[facility] ADULT ICU REPLACEMENT PROTOCOL FOR AM LAB REPLACEMENT ONLY  The patient does not apply for the [facility] Adult ICU Electrolyte Replacment Protocol based on the criteria listed below:   1. Is GFR >/= 40 ml/min? no  Patient's GFR today is 22 3. Is BUN < 60 mg/dL? no  Patient's BUN today is 72 4. Abnormal electrolyte(s): K+3.1 5. Ordered repletion with: NA 6. If a panic level lab has been reported, has the CCM MD in charge been notified? yes.   Physician:  Rise Paganini 11/20/2012 5:39 AM

## 2012-11-21 ENCOUNTER — Inpatient Hospital Stay (HOSPITAL_COMMUNITY): Payer: Medicare Other

## 2012-11-21 ENCOUNTER — Other Ambulatory Visit (HOSPITAL_COMMUNITY): Payer: Self-pay | Admitting: Emergency Medicine

## 2012-11-21 LAB — CBC
Hemoglobin: 8.6 g/dL — ABNORMAL LOW (ref 12.0–15.0)
MCH: 26 pg (ref 26.0–34.0)
MCHC: 31.6 g/dL (ref 30.0–36.0)
Platelets: 323 10*3/uL (ref 150–400)
RBC: 3.31 MIL/uL — ABNORMAL LOW (ref 3.87–5.11)

## 2012-11-21 LAB — BASIC METABOLIC PANEL
BUN: 61 mg/dL — ABNORMAL HIGH (ref 6–23)
Calcium: 11.8 mg/dL — ABNORMAL HIGH (ref 8.4–10.5)
GFR calc Af Amer: 28 mL/min — ABNORMAL LOW (ref >90)
GFR calc non Af Amer: 24 mL/min — ABNORMAL LOW (ref >90)
Potassium: 3.2 mEq/L — ABNORMAL LOW (ref 3.5–5.1)
Sodium: 143 mEq/L (ref 135–145)

## 2012-11-21 LAB — POCT I-STAT 3, ART BLOOD GAS (G3+)
Acid-base deficit: 7 mmol/L — ABNORMAL HIGH (ref 0.0–2.0)
pO2, Arterial: 69 mmHg — ABNORMAL LOW (ref 80.0–100.0)

## 2012-11-21 LAB — GLUCOSE, CAPILLARY
Glucose-Capillary: 244 mg/dL — ABNORMAL HIGH (ref 70–99)
Glucose-Capillary: 132 mg/dL — ABNORMAL HIGH (ref 70–99)
Glucose-Capillary: 96 mg/dL (ref 70–99)
Glucose-Capillary: 142 mg/dL — ABNORMAL HIGH (ref 70–99)
Glucose-Capillary: 156 mg/dL — ABNORMAL HIGH (ref 70–99)

## 2012-11-21 LAB — PROTIME-INR: Prothrombin Time: 25.5 seconds — ABNORMAL HIGH (ref 11.6–15.2)

## 2012-11-21 MED ORDER — INSULIN ASPART 100 UNIT/ML ~~LOC~~ SOLN
1.0000 [IU] | SUBCUTANEOUS | Status: DC
  Administered 2012-11-21: 3 [IU] via SUBCUTANEOUS

## 2012-11-21 MED ORDER — FENTANYL CITRATE 0.05 MG/ML IJ SOLN: 25.0000 ug | INTRAMUSCULAR | Status: DC | PRN

## 2012-11-21 MED ORDER — LORAZEPAM 2 MG/ML IJ SOLN
2.0000 mg | Freq: Once | INTRAMUSCULAR | Status: AC
  Administered 2012-11-21: 2 mg via INTRAVENOUS

## 2012-11-21 MED ORDER — DEXMEDETOMIDINE HCL IN NACL 200 MCG/50ML IV SOLN
0.2000 ug/kg/h | INTRAVENOUS | Status: DC
  Administered 2012-11-21: 0.4 ug/kg/h via INTRAVENOUS
  Filled 2012-11-21 (×2): qty 50

## 2012-11-21 MED ORDER — PHENYLEPHRINE HCL 10 MG/ML IJ SOLN
30.0000 ug/min | INTRAVENOUS | Status: DC
  Administered 2012-11-21: 30 ug/min via INTRAVENOUS
  Filled 2012-11-21: qty 1

## 2012-11-21 MED ORDER — METOPROLOL TARTRATE 1 MG/ML IV SOLN: 5.0000 mg | Freq: Four times a day (QID) | INTRAVENOUS | Status: DC

## 2012-11-21 MED ORDER — NOREPINEPHRINE BITARTRATE 1 MG/ML IJ SOLN
2.0000 ug/min | INTRAVENOUS | Status: DC
  Administered 2012-11-21: 5 ug/min via INTRAVENOUS
  Administered 2012-11-22: 8 ug/min via INTRAVENOUS
  Filled 2012-11-21 (×3): qty 4

## 2012-11-21 MED ORDER — VECURONIUM BROMIDE 10 MG IV SOLR
10.0000 mg | Freq: Once | INTRAVENOUS | Status: DC
  Filled 2012-11-21: qty 10

## 2012-11-21 MED ORDER — SODIUM CHLORIDE 0.9 % IV BOLUS (SEPSIS)
1000.0000 mL | Freq: Once | INTRAVENOUS | Status: AC
  Administered 2012-11-21: 1000 mL via INTRAVENOUS

## 2012-11-21 MED ORDER — HYDROCORTISONE SOD SUCCINATE 100 MG IJ SOLR
50.0000 mg | Freq: Four times a day (QID) | INTRAMUSCULAR | Status: DC
  Administered 2012-11-21 – 2012-11-24 (×11): 50 mg via INTRAVENOUS
  Filled 2012-11-21 (×16): qty 1

## 2012-11-21 MED ORDER — INSULIN GLARGINE 100 UNIT/ML ~~LOC~~ SOLN
10.0000 [IU] | SUBCUTANEOUS | Status: DC
  Administered 2012-11-21: 10 [IU] via SUBCUTANEOUS
  Filled 2012-11-21: qty 0.1

## 2012-11-21 MED ORDER — POTASSIUM CHLORIDE 20 MEQ/15ML (10%) PO LIQD
40.0000 meq | Freq: Once | ORAL | Status: AC
  Administered 2012-11-21: 40 meq

## 2012-11-21 MED ORDER — MIDAZOLAM HCL 2 MG/2ML IJ SOLN
INTRAMUSCULAR | Status: AC
  Administered 2012-11-21: 2 mg
  Filled 2012-11-21: qty 2

## 2012-11-21 MED ORDER — MIDAZOLAM HCL 2 MG/2ML IJ SOLN
2.0000 mg | INTRAMUSCULAR | Status: DC | PRN
  Administered 2012-11-21 – 2012-12-13 (×9): 2 mg via INTRAVENOUS
  Administered 2012-12-13: 4 mg via INTRAVENOUS
  Administered 2012-12-17 – 2012-12-22 (×7): 2 mg via INTRAVENOUS
  Filled 2012-11-21 (×14): qty 2
  Filled 2012-11-21: qty 4
  Filled 2012-11-21: qty 2
  Filled 2012-11-21: qty 4
  Filled 2012-11-21 (×3): qty 2
  Filled 2012-11-21: qty 4
  Filled 2012-11-21: qty 2

## 2012-11-21 MED ORDER — METOCLOPRAMIDE HCL 5 MG/ML IJ SOLN
5.0000 mg | Freq: Four times a day (QID) | INTRAMUSCULAR | Status: DC
  Administered 2012-11-21 – 2012-11-22 (×4): 5 mg via INTRAVENOUS
  Filled 2012-11-21 (×9): qty 1

## 2012-11-21 MED ORDER — VITAMIN K1 10 MG/ML IJ SOLN
1.0000 mg | Freq: Once | INTRAVENOUS | Status: AC
  Administered 2012-11-21: 1 mg via INTRAVENOUS
  Filled 2012-11-21: qty 0.1

## 2012-11-21 MED ORDER — FENTANYL CITRATE 0.05 MG/ML IJ SOLN
25.0000 ug | INTRAMUSCULAR | Status: DC | PRN
  Administered 2012-11-22 (×3): 50 ug via INTRAVENOUS
  Administered 2012-11-26: 25 ug via INTRAVENOUS
  Filled 2012-11-21 (×3): qty 2

## 2012-11-21 MED ORDER — MIDAZOLAM HCL 2 MG/2ML IJ SOLN: 4.0000 mg | Freq: Once | INTRAMUSCULAR | Status: DC

## 2012-11-21 MED ORDER — FENTANYL CITRATE 0.05 MG/ML IJ SOLN
INTRAMUSCULAR | Status: AC
  Filled 2012-11-21: qty 2

## 2012-11-21 MED ORDER — WARFARIN SODIUM 5 MG PO TABS
5.0000 mg | ORAL_TABLET | Freq: Once | ORAL | Status: DC
  Filled 2012-11-21: qty 1

## 2012-11-21 MED ORDER — MIDAZOLAM HCL 2 MG/2ML IJ SOLN: 1.0000 mg | INTRAMUSCULAR | Status: DC | PRN

## 2012-11-21 MED ORDER — ETOMIDATE 2 MG/ML IV SOLN
40.0000 mg | Freq: Once | INTRAVENOUS | Status: AC
  Administered 2012-11-23: 40 mg via INTRAVENOUS
  Filled 2012-11-21: qty 20

## 2012-11-21 MED ORDER — PROPOFOL 10 MG/ML IV EMUL: 5.0000 ug/kg/min | Freq: Once | INTRAVENOUS | Status: DC

## 2012-11-21 MED ORDER — INSULIN ASPART 100 UNIT/ML ~~LOC~~ SOLN
0.0000 [IU] | SUBCUTANEOUS | Status: DC
  Administered 2012-11-21: 3 [IU] via SUBCUTANEOUS
  Administered 2012-11-21: 4 [IU] via SUBCUTANEOUS
  Administered 2012-11-21: 09:00:00 via SUBCUTANEOUS
  Administered 2012-11-22: 3 [IU] via SUBCUTANEOUS
  Administered 2012-11-22 (×2): 4 [IU] via SUBCUTANEOUS
  Administered 2012-11-23: 3 [IU] via SUBCUTANEOUS
  Administered 2012-11-23: 4 [IU] via SUBCUTANEOUS
  Administered 2012-11-23: 3 [IU] via SUBCUTANEOUS
  Administered 2012-11-23 (×3): 4 [IU] via SUBCUTANEOUS
  Administered 2012-11-24 (×3): 3 [IU] via SUBCUTANEOUS
  Administered 2012-11-24 – 2012-11-25 (×2): 4 [IU] via SUBCUTANEOUS
  Administered 2012-11-25: 3 [IU] via SUBCUTANEOUS
  Administered 2012-11-25 (×2): 4 [IU] via SUBCUTANEOUS
  Administered 2012-11-25: 3 [IU] via SUBCUTANEOUS
  Administered 2012-11-25 – 2012-11-26 (×4): 4 [IU] via SUBCUTANEOUS
  Administered 2012-11-26: 3 [IU] via SUBCUTANEOUS
  Administered 2012-11-26: 4 [IU] via SUBCUTANEOUS
  Administered 2012-11-26: 17:00:00 via SUBCUTANEOUS
  Administered 2012-11-27: 7 [IU] via SUBCUTANEOUS
  Administered 2012-11-27 (×4): 4 [IU] via SUBCUTANEOUS
  Administered 2012-11-28: 3 [IU] via SUBCUTANEOUS
  Administered 2012-11-28: 7 [IU] via SUBCUTANEOUS
  Administered 2012-11-28 (×2): 4 [IU] via SUBCUTANEOUS
  Administered 2012-11-28: 3 [IU] via SUBCUTANEOUS
  Administered 2012-11-29: 7 [IU] via SUBCUTANEOUS
  Administered 2012-11-29 (×2): 3 [IU] via SUBCUTANEOUS
  Administered 2012-11-29: 4 [IU] via SUBCUTANEOUS
  Administered 2012-11-29 – 2012-11-30 (×2): 7 [IU] via SUBCUTANEOUS
  Administered 2012-11-30: 4 [IU] via SUBCUTANEOUS
  Administered 2012-11-30: 7 [IU] via SUBCUTANEOUS
  Administered 2012-11-30 – 2012-12-01 (×4): 4 [IU] via SUBCUTANEOUS
  Administered 2012-12-01 (×2): 3 [IU] via SUBCUTANEOUS
  Administered 2012-12-02: 7 [IU] via SUBCUTANEOUS
  Administered 2012-12-02 (×2): 4 [IU] via SUBCUTANEOUS
  Administered 2012-12-02: 3 [IU] via SUBCUTANEOUS
  Administered 2012-12-02: 4 [IU] via SUBCUTANEOUS
  Administered 2012-12-03: 7 [IU] via SUBCUTANEOUS
  Administered 2012-12-03: 3 [IU] via SUBCUTANEOUS
  Administered 2012-12-03 (×3): 4 [IU] via SUBCUTANEOUS
  Administered 2012-12-03: 7 [IU] via SUBCUTANEOUS
  Administered 2012-12-04: 4 [IU] via SUBCUTANEOUS
  Administered 2012-12-04: 12:00:00 via SUBCUTANEOUS
  Administered 2012-12-04 (×2): 7 [IU] via SUBCUTANEOUS
  Administered 2012-12-04: 4 [IU] via SUBCUTANEOUS
  Administered 2012-12-05: 11 [IU] via SUBCUTANEOUS
  Administered 2012-12-05: 3 [IU] via SUBCUTANEOUS
  Administered 2012-12-05: 4 [IU] via SUBCUTANEOUS
  Administered 2012-12-05: 11 [IU] via SUBCUTANEOUS
  Administered 2012-12-05 – 2012-12-06 (×3): 4 [IU] via SUBCUTANEOUS
  Administered 2012-12-06: 7 [IU] via SUBCUTANEOUS
  Administered 2012-12-07: 4 [IU] via SUBCUTANEOUS
  Administered 2012-12-07: 11 [IU] via SUBCUTANEOUS
  Administered 2012-12-07: 3 [IU] via SUBCUTANEOUS
  Administered 2012-12-07 – 2012-12-08 (×3): 7 [IU] via SUBCUTANEOUS
  Administered 2012-12-08 (×3): 4 [IU] via SUBCUTANEOUS
  Administered 2012-12-08 (×2): 7 [IU] via SUBCUTANEOUS
  Administered 2012-12-09: 20 [IU] via SUBCUTANEOUS
  Administered 2012-12-09 (×2): 4 [IU] via SUBCUTANEOUS
  Administered 2012-12-09: 7 [IU] via SUBCUTANEOUS
  Administered 2012-12-09: 20 [IU] via SUBCUTANEOUS
  Administered 2012-12-10: 4 [IU] via SUBCUTANEOUS
  Administered 2012-12-10: 7 [IU] via SUBCUTANEOUS
  Administered 2012-12-10: 11 [IU] via SUBCUTANEOUS
  Administered 2012-12-10: 3 [IU] via SUBCUTANEOUS
  Administered 2012-12-10: 15 [IU] via SUBCUTANEOUS
  Administered 2012-12-10: 3 [IU] via SUBCUTANEOUS
  Administered 2012-12-11: 11 [IU] via SUBCUTANEOUS
  Administered 2012-12-11: 4 [IU] via SUBCUTANEOUS
  Administered 2012-12-11 (×2): 7 [IU] via SUBCUTANEOUS
  Administered 2012-12-11: 3 [IU] via SUBCUTANEOUS
  Administered 2012-12-11: 7 [IU] via SUBCUTANEOUS
  Administered 2012-12-11 – 2012-12-12 (×3): 3 [IU] via SUBCUTANEOUS
  Administered 2012-12-12: 7 [IU] via SUBCUTANEOUS
  Administered 2012-12-12: 15 [IU] via SUBCUTANEOUS
  Administered 2012-12-12 – 2012-12-13 (×4): 7 [IU] via SUBCUTANEOUS
  Administered 2012-12-13: 11 [IU] via SUBCUTANEOUS
  Administered 2012-12-13: 3 [IU] via SUBCUTANEOUS
  Administered 2012-12-14: 11 [IU] via SUBCUTANEOUS
  Administered 2012-12-14: 4 [IU] via SUBCUTANEOUS
  Administered 2012-12-14: 15 [IU] via SUBCUTANEOUS
  Administered 2012-12-14 – 2012-12-15 (×2): 3 [IU] via SUBCUTANEOUS
  Administered 2012-12-15: 4 [IU] via SUBCUTANEOUS
  Administered 2012-12-15: 7 [IU] via SUBCUTANEOUS
  Administered 2012-12-15 (×3): 11 [IU] via SUBCUTANEOUS
  Administered 2012-12-16: 4 [IU] via SUBCUTANEOUS
  Administered 2012-12-16: 3 [IU] via SUBCUTANEOUS
  Administered 2012-12-16 (×4): 4 [IU] via SUBCUTANEOUS
  Administered 2012-12-16: 7 [IU] via SUBCUTANEOUS
  Administered 2012-12-17: 4 [IU] via SUBCUTANEOUS
  Administered 2012-12-17 (×2): 7 [IU] via SUBCUTANEOUS
  Administered 2012-12-17 (×2): 4 [IU] via SUBCUTANEOUS
  Administered 2012-12-18: 11 [IU] via SUBCUTANEOUS
  Administered 2012-12-18: 4 [IU] via SUBCUTANEOUS
  Administered 2012-12-18: 3 [IU] via SUBCUTANEOUS
  Administered 2012-12-18: 7 [IU] via SUBCUTANEOUS
  Administered 2012-12-18: 3 [IU] via SUBCUTANEOUS
  Administered 2012-12-18: 7 [IU] via SUBCUTANEOUS
  Administered 2012-12-19: 3 [IU] via SUBCUTANEOUS
  Administered 2012-12-19: 4 [IU] via SUBCUTANEOUS
  Administered 2012-12-19 (×2): 7 [IU] via SUBCUTANEOUS
  Administered 2012-12-19 – 2012-12-20 (×3): 4 [IU] via SUBCUTANEOUS
  Administered 2012-12-20: 15 [IU] via SUBCUTANEOUS
  Administered 2012-12-20 (×3): 4 [IU] via SUBCUTANEOUS
  Administered 2012-12-21: 15 [IU] via SUBCUTANEOUS
  Administered 2012-12-21: 7 [IU] via SUBCUTANEOUS
  Administered 2012-12-21: 11 [IU] via SUBCUTANEOUS
  Administered 2012-12-22: 7 [IU] via SUBCUTANEOUS
  Administered 2012-12-22: 15 [IU] via SUBCUTANEOUS
  Administered 2012-12-22: 4 [IU] via SUBCUTANEOUS
  Administered 2012-12-22: 3 [IU] via SUBCUTANEOUS
  Administered 2012-12-22: 7 [IU] via SUBCUTANEOUS

## 2012-11-21 MED ORDER — METOPROLOL TARTRATE 1 MG/ML IV SOLN
5.0000 mg | Freq: Four times a day (QID) | INTRAVENOUS | Status: DC
  Administered 2012-11-21 – 2012-11-22 (×4): 5 mg via INTRAVENOUS
  Filled 2012-11-21 (×8): qty 5

## 2012-11-21 MED ORDER — DEXTROSE 10 % IV SOLN: INTRAVENOUS | Status: DC | PRN

## 2012-11-21 MED ORDER — MIDAZOLAM HCL 2 MG/2ML IJ SOLN: 2.0000 mg | Freq: Once | INTRAMUSCULAR | Status: AC

## 2012-11-21 MED ORDER — WHITE PETROLATUM GEL
Status: AC
  Administered 2012-11-21: 0.2
  Filled 2012-11-21: qty 5

## 2012-11-21 MED ORDER — FENTANYL CITRATE 0.05 MG/ML IJ SOLN
200.0000 ug | Freq: Once | INTRAMUSCULAR | Status: AC
  Administered 2012-11-22: 50 ug via INTRAVENOUS
  Filled 2012-11-21: qty 2

## 2012-11-21 MED ORDER — WARFARIN - PHARMACIST DOSING INPATIENT
Freq: Every day | Status: DC
  Administered 2012-11-22 – 2012-11-23 (×2)

## 2012-11-21 MED ORDER — SODIUM CHLORIDE 0.9 % IV BOLUS (SEPSIS)
500.0000 mL | Freq: Once | INTRAVENOUS | Status: AC
  Administered 2012-11-21: 500 mL via INTRAVENOUS

## 2012-11-21 MED ORDER — POTASSIUM CHLORIDE 20 MEQ/15ML (10%) PO LIQD
ORAL | Status: AC
  Filled 2012-11-21: qty 30

## 2012-11-21 NOTE — Progress Notes (Signed)
Chaplain visited [phone] in response to family request. Daughter of pt requested prayer. Chaplain listened empathically to daughter, shared words of comfort with both family members in the room and prayed. Chaplain will follow up as needed. Daughter thanked Orthoptist for honoring her request immediately. Kelle Darting [phone]  11/21/12 1506  Clinical Encounter Type  Visited With Patient and family together  Visit Type Follow-up;Spiritual support;Critical Care  Referral From Family  Spiritual Encounters  Spiritual Needs Prayer;Emotional  Stress Factors  Patient Stress Factors Lack of knowledge;Not reviewed  Family Stress Factors Exhausted;Loss;Major life changes (anticpatory loss and grief)

## 2012-11-21 NOTE — Progress Notes (Addendum)
NUTRITION FOLLOW UP  Intervention:   1.  Enteral nutrition; recommend re-initiation of Osmolite 1.5 @ 20 mL/hr continuous with Prostat 5 times daily to provide 1220 kcal, 105g protein, and 366 mL free water.  If aspiration risk increased, pt would benefit from post-pyloric tube placement.  Maintain recommendations for low volume infusion at this time  Nutrition Dx:   Inadequate oral intake, ongoing  Monitor:   1.  Enteral nutrition; .Enteral nutrition to provide 60-70% of estimated calorie needs (22-25 kcals/kg ideal body weight) and 100% of estimated protein needs, based on ASPEN guidelines for permissive underfeeding in critically ill obese individuals. 2.  Wt/wt change; monitor trends  Assessment:   Pt admitted with respiratory failure due to CHF exacerbation. Pt remains intubated after episode of vomiting.  Patient is currently intubated on ventilator support.  MV: 10.3 L/min Temp:Temp (24hrs), Avg:98 F (36.7 C), Min:97.2 F (36.2 C), Max:98.6 F (37 C)  Pt with tolerance of TF and low residuals over the weekend.  However experienced vomiting yesterday with weaning trials. Pt has initiated reglan.  Trach when INR appropriate  Height: Ht Readings from Last 1 Encounters:  11/09/12 5\' 2"  (1.575 m)    Weight Status:   Wt Readings from Last 1 Encounters:  11/21/12 181 lb 3.5 oz (82.2 kg)    Re-estimated needs:  Kcal: 1649 Permissive underfeeding goals:  1150-1250 kcal/d  Protein: >/=100g Fluid: ~1.8 L/day  Skin: intact, non-pitting edema  Diet Order: NPO   Intake/Output Summary (Last 24 hours) at 11/21/12 1210 Last data filed at 11/21/12 1100  Gross per 24 hour  Intake 5483.22 ml  Output   1746 ml  Net 3737.22 ml    Last BM: 7/10   Labs:   Recent Labs Lab 11/19/12 0450 11/19/12 0530 11/20/12 0440 11/21/12 0410  NA QUESTIONABLE RESULTS, RECOMMEND RECOLLECT TO VERIFY 150* 149* 143  K 3.8 3.5 3.1* 3.2*  CL QUESTIONABLE RESULTS, RECOMMEND RECOLLECT  TO VERIFY 119* 117* 112  CO2 QUESTIONABLE RESULTS, RECOMMEND RECOLLECT TO VERIFY 23 24 19   BUN QUESTIONABLE RESULTS, RECOMMEND RECOLLECT TO VERIFY 65* 72* 61*  CREATININE QUESTIONABLE RESULTS, RECOMMEND RECOLLECT TO VERIFY 2.18* 2.39* 1.98*  CALCIUM QUESTIONABLE RESULTS, RECOMMEND RECOLLECT TO VERIFY 12.9* 11.5* 11.8*  MG QUESTIONABLE RESULTS, RECOMMEND RECOLLECT TO VERIFY 2.4 2.4  --   PHOS QUESTIONABLE RESULTS, RECOMMEND RECOLLECT TO VERIFY 3.3 4.0  --   GLUCOSE QUESTIONABLE RESULTS, RECOMMEND RECOLLECT TO VERIFY 182* 89 281*    CBG (last 3)   Recent Labs  11/21/12 0306 11/21/12 0450 11/21/12 0734  GLUCAP 133* 201* 127*    Scheduled Meds: . ampicillin-sulbactam (UNASYN) IV  1.5 g Intravenous Q12H  . antiseptic oral rinse  15 mL Mouth Rinse BID  . atorvastatin  20 mg Oral q1800  . chlorhexidine  15 mL Mouth/Throat BID  . insulin aspart  0-20 Units Subcutaneous Q4H  . ipratropium  0.5 mg Nebulization Q6H  . metoCLOPramide (REGLAN) injection  5 mg Intravenous Q6H  . multivitamin  5 mL Per Tube Daily  . pantoprazole sodium  40 mg Per Tube QHS  . polyethylene glycol  17 g Per Tube BID  . predniSONE  20 mg Per Tube Q breakfast  . vancomycin  750 mg Intravenous Q24H  . warfarin  5 mg Oral ONCE-1800  . Warfarin - Pharmacist Dosing Inpatient   Does not apply q1800    Continuous Infusions: . sodium chloride 75 mL/hr at 11/20/12 1101  . sodium chloride 20 mL/hr at 11/21/12 0500  .  dexmedetomidine 0.4 mcg/kg/hr (11/21/12 1154)  . phenylephrine (NEO-SYNEPHRINE) Adult infusion      Loyce Dys, MS RD LDN Clinical Inpatient Dietitian Pager: [phone] Weekend/After hours pager: [phone]

## 2012-11-21 NOTE — Progress Notes (Signed)
eLink Physician-Brief Progress Note Patient Name: [name] DOB: 11-Jun-1940 MRN: [id number]  Date of Service  11/21/2012   HPI/Events of Note  Hypotensive with HR 80 in A Fib despite fluid bolus, holding lopressor, and reducing sedation   eICU Interventions  Dc amiodarone Start neo   Intervention Category Major Interventions: Hypotension - evaluation and management  [name] 11/21/2012, 5:39 AM

## 2012-11-21 NOTE — Progress Notes (Signed)
eLink Physician-Brief Progress Note Patient Name: [name] DOB: [birth year] MRN: [id number]  Date of Service  11/21/2012   HPI/Events of Note   k 3.2  eICU Interventions  kcl   Intervention Category Minor Interventions: Electrolytes abnormality - evaluation and management  [name] 11/21/2012, 5:55 AM

## 2012-11-21 NOTE — Progress Notes (Signed)
ANTICOAGULATION and ANTIBIOTIC CONSULT NOTE - Follow Up Consult  Pharmacy Consult for warfarin Indication: atrial fibrillation  No Known Allergies  Patient Measurements: Height: 5\' 2"  (157.5 cm) Weight: 181 lb 3.5 oz (82.2 kg) IBW/kg (Calculated) : 50.1  Vital Signs: Temp: 98.1 F (36.7 C) (07/15 0700) BP: 109/79 mmHg (07/15 0700) Pulse Rate: 88 (07/15 0700)  Labs:  Recent Labs  11/19/12 0530 11/20/12 11/20/12 0440 11/21/12 0410  HGB 7.9* 8.6*  --  8.6*  HCT 25.5* 27.5*  --  27.2*  PLT 208 260  --  323  LABPROT 25.3*  --  26.9* 25.5*  INR 2.39*  --  2.59* 2.42*  CREATININE 2.18*  --  2.39* 1.98*    Estimated Creatinine Clearance: 25.5 ml/min (by C-G formula based on Cr of 1.98).   Medications:  Scheduled:  . ampicillin-sulbactam (UNASYN) IV  1.5 g Intravenous Q12H  . antiseptic oral rinse  15 mL Mouth Rinse BID  . atorvastatin  20 mg Oral q1800  . chlorhexidine  15 mL Mouth/Throat BID  . insulin aspart  0-20 Units Subcutaneous Q4H  . ipratropium  0.5 mg Nebulization Q6H  . metoCLOPramide (REGLAN) injection  5 mg Intravenous Q6H  . multivitamin  5 mL Per Tube Daily  . pantoprazole sodium  40 mg Per Tube QHS  . polyethylene glycol  17 g Per Tube BID  . predniSONE  20 mg Per Tube Q breakfast  . vancomycin  750 mg Intravenous Q24H    Assessment: 72 YOF with hx of Afib on chronic warfarin therapy. INR remains therapeutic this morning at 2.42. INRs have been at goal since 7/12; patient noted for DCCV 7/15 or 7/16.   Home coumadin dose: 2.5 mg daily EXCEPT for 5 mg on Mondays   Goal of Therapy:  INR 2-3 Monitor platelets by anticoagulation protocol: Yes    Plan:  1. Warfarin 5mg  PO x1 tonight (will attempt to move towards home regimen) 2. Daily PT/INR  Harland German, Pharm D 11/21/2012 8:15 AM

## 2012-11-21 NOTE — Progress Notes (Signed)
SUBJECTIVE:  Intubated and opens eyes.  Indicates no pain.    PHYSICAL EXAM Filed Vitals:   11/21/12 0444 11/21/12 0500 11/21/12 0600 11/21/12 0700  BP:    109/79  Pulse:  30  88  Temp:  98.1 F (36.7 C) 97.9 F (36.6 C) 98.1 F (36.7 C)  TempSrc:      Resp:  25 24 23   Height:      Weight: 181 lb 3.5 oz (82.2 kg)     SpO2:  92% 96% 97%   General:  No distress Lungs:  Clear Heart:  Irregular Abdomen:  Positive bowel sounds, no rebound no guarding Extremities:  No edema, nonfocal  LABS:  Results for orders placed during the hospital encounter of 11/09/12 (from the past 24 hour(s))  IRON AND TIBC     Status: Abnormal   Collection Time    11/20/12 10:30 AM      Result Value Range   Iron 54  42 - 135 ug/dL   TIBC 027 (*) 253 - 664 ug/dL   Saturation Ratios 29  20 - 55 %   UIBC 132  125 - 400 ug/dL  FERRITIN     Status: Abnormal   Collection Time    11/20/12 10:30 AM      Result Value Range   Ferritin 343 (*) 10 - 291 ng/mL  GLUCOSE, CAPILLARY     Status: Abnormal   Collection Time    11/20/12  1:00 PM      Result Value Range   Glucose-Capillary 312 (*) 70 - 99 mg/dL   Comment 1 Documented in Chart     Comment 2 Notify RN    GLUCOSE, CAPILLARY     Status: Abnormal   Collection Time    11/20/12  4:12 PM      Result Value Range   Glucose-Capillary 276 (*) 70 - 99 mg/dL  GLUCOSE, CAPILLARY     Status: Abnormal   Collection Time    11/20/12  5:32 PM      Result Value Range   Glucose-Capillary 249 (*) 70 - 99 mg/dL  GLUCOSE, CAPILLARY     Status: Abnormal   Collection Time    11/20/12  6:38 PM      Result Value Range   Glucose-Capillary 205 (*) 70 - 99 mg/dL   Comment 1 Documented in Chart     Comment 2 Notify RN    GLUCOSE, CAPILLARY     Status: Abnormal   Collection Time    11/20/12  7:46 PM      Result Value Range   Glucose-Capillary 244 (*) 70 - 99 mg/dL  GLUCOSE, CAPILLARY     Status: Abnormal   Collection Time    11/20/12  8:50 PM      Result  Value Range   Glucose-Capillary 190 (*) 70 - 99 mg/dL  GLUCOSE, CAPILLARY     Status: Abnormal   Collection Time    11/20/12  9:55 PM      Result Value Range   Glucose-Capillary 132 (*) 70 - 99 mg/dL  GLUCOSE, CAPILLARY     Status: Abnormal   Collection Time    11/20/12 11:00 PM      Result Value Range   Glucose-Capillary 129 (*) 70 - 99 mg/dL  GLUCOSE, CAPILLARY     Status: Abnormal   Collection Time    11/20/12 11:58 PM      Result Value Range   Glucose-Capillary 118 (*) 70 - 99 mg/dL  GLUCOSE, CAPILLARY     Status: Abnormal   Collection Time    11/21/12  1:05 AM      Result Value Range   Glucose-Capillary 109 (*) 70 - 99 mg/dL  GLUCOSE, CAPILLARY     Status: Abnormal   Collection Time    11/21/12  2:11 AM      Result Value Range   Glucose-Capillary 122 (*) 70 - 99 mg/dL  GLUCOSE, CAPILLARY     Status: Abnormal   Collection Time    11/21/12  3:06 AM      Result Value Range   Glucose-Capillary 133 (*) 70 - 99 mg/dL  PROTIME-INR     Status: Abnormal   Collection Time    11/21/12  4:10 AM      Result Value Range   Prothrombin Time 25.5 (*) 11.6 - 15.2 seconds   INR 2.42 (*) 0.00 - 1.49  BASIC METABOLIC PANEL     Status: Abnormal   Collection Time    11/21/12  4:10 AM      Result Value Range   Sodium 143  135 - 145 mEq/L   Potassium 3.2 (*) 3.5 - 5.1 mEq/L   Chloride 112  96 - 112 mEq/L   CO2 19  19 - 32 mEq/L   Glucose, Bld 281 (*) 70 - 99 mg/dL   BUN 61 (*) 6 - 23 mg/dL   Creatinine, Ser 4.78 (*) 0.50 - 1.10 mg/dL   Calcium 29.5 (*) 8.4 - 10.5 mg/dL   GFR calc non Af Amer 24 (*) >90 mL/min   GFR calc Af Amer 28 (*) >90 mL/min  CBC     Status: Abnormal   Collection Time    11/21/12  4:10 AM      Result Value Range   WBC 26.0 (*) 4.0 - 10.5 K/uL   RBC 3.31 (*) 3.87 - 5.11 MIL/uL   Hemoglobin 8.6 (*) 12.0 - 15.0 g/dL   HCT 62.1 (*) 30.8 - 65.7 %   MCV 82.2  78.0 - 100.0 fL   MCH 26.0  26.0 - 34.0 pg   MCHC 31.6  30.0 - 36.0 g/dL   RDW 84.6 (*) 96.2 - 95.2 %    Platelets 323  150 - 400 K/uL  GLUCOSE, CAPILLARY     Status: Abnormal   Collection Time    11/21/12  4:50 AM      Result Value Range   Glucose-Capillary 201 (*) 70 - 99 mg/dL    Intake/Output Summary (Last 24 hours) at 11/21/12 0738 Last data filed at 11/21/12 0700  Gross per 24 hour  Intake 6074.22 ml  Output   2125 ml  Net 3949.22 ml    ASSESSMENT AND PLAN:  Acute on chronic diastolic congestive heart failure:  Volume is back up with some edema on CXR.  Greater than 4 liters positive.  She was given fluid through the night.   CHRONIC KIDNEY DISEASE STAGE V:  Creat coming down with increased volume.   FIBRILLATION, ATRIAL:  Off of amiodarone last night secondary to hypotension.  She has been in atrial fib since 7/7.  She has had a therapeutic warfarin level since 7/6.  She will be DCCV and I will try to coordinate this for today or tomorrow.  I will discuss timing with CCM.  Acute respiratory failure with hypoxia:  Trach per [employer] 11/21/2012 7:38 AM

## 2012-11-21 NOTE — Progress Notes (Signed)
eLink Physician-Brief Progress Note Patient Name: [name] DOB: Jun 05, 1940 MRN: [id number]  Date of Service  11/21/2012   HPI/Events of Note  Pt with paf rvr   eICU Interventions  Iv beta blocker restarted   Intervention Category Major Interventions: Arrhythmia - evaluation and management  Shan Levans 11/21/2012, 9:55 PM

## 2012-11-21 NOTE — Progress Notes (Signed)
PULMONARY  / CRITICAL CARE MEDICINE  Name: [name] MRN: [id number] DOB: [birth year]    ADMISSION DATE:  11/09/2012 CONSULTATION DATE:  11/09/2012  REFERRING MD :  Preston Fleeting PRIMARY SERVICE: PCCM  CHIEF COMPLAINT:  Shortness of breath  BRIEF PATIENT DESCRIPTION: 72 y/o female with CHF was admitted on 7/3 from the [facility] ED with acute hypoxemic respiratory failure due to a CHF exacerbation, ? Superimposed PNA  LINES / TUBES: 7/3 ETT >>11/13/12, 11/13/12  (failed extubation immediately, ? aspirated) >> 7/4 CVC R IJ >>  7/5 A-line >>  CULTURES: 7/4 U strep >> neg 7/4 legionella >> neg 7/3 mrsa PCR >> POSITIVE 7/8 mini-BAL >> yeast  ANTIBIOTICS: 7/3 Ceftriaxone >>7/6 7/4 levofloxacin >>7/7 7/4 Vancomycin >>7/6, restarted 7/8 >> 7/7 Unasyn >>  SIGNIFICANT EVENTS: 7/3 admission 7/07 reintubated immediately after extubation.  ? Aspiration. ABX restarted.  on Neo-Synephrine.  7/09 Able to wean fio2 and neo. Lopressor required for rate control of afib. 7/10 Off neo, but now tachycardic.  Failed SBT this AM, but RT felt it was due to sedation.  Sedation weaned 7/14 Hypotensive, vomiting, amiodarone turned off for low heart rate  STUDIES:  SUBJECTIVE: Unable to tolerate pressure support.  VITAL SIGNS: Temp:  [97.2 F (36.2 C)-98.8 F (37.1 C)] 98.1 F (36.7 C) (07/15 0700) Pulse Rate:  [30-122] 88 (07/15 0700) Resp:  [18-38] 23 (07/15 0700) BP: (80-134)/(46-92) 109/79 mmHg (07/15 0700) SpO2:  [89 %-100 %] 97 % (07/15 0700) Arterial Line BP: (68-200)/(46-100) 99/96 mmHg (07/15 0700) FiO2 (%):  [40 %-100 %] 100 % (07/15 0424) Weight:  [181 lb 3.5 oz (82.2 kg)] 181 lb 3.5 oz (82.2 kg) (07/15 0444)  HEMODYNAMICS: CVP:  [11 mmHg-15 mmHg] 14 mmHg  VENTILATOR SETTINGS: Vent Mode:  [-] PRVC FiO2 (%):  [40 %-100 %] 100 % Set Rate:  [14 bmp-20 bmp] 14 bmp Vt Set:  [500 mL] 500 mL PEEP:  [5 cmH20] 5 cmH20 Plateau Pressure:  [12 cmH20-20 cmH20] 20 cmH20  INTAKE /  OUTPUT: Intake/Output     07/14 0701 - 07/15 0700 07/15 0701 - 07/16 0700   I.V. (mL/kg) 2854.2 (34.7)    Other 10    NG/GT 1960    IV Piggyback 1250    Total Intake(mL/kg) 6074.2 (73.9)    Urine (mL/kg/hr) 2025 (1)    Stool 100 (0.1)    Total Output 2125     Net +3949.2           PHYSICAL EXAMINATION: Gen: No distress HEENT: ETT in place PULM: scattered rhonchi CV: irregular, tachycardic AB: soft, non tender Ext: no edema Derm: no rash Neuro: RASS -1, moves extremities  LABS: CBC Recent Labs     11/19/12  0530 11/20/12  11/21/12  0410  WBC  17.7*  16.9*  26.0*  HGB  7.9*  8.6*  8.6*  HCT  25.5*  27.5*  27.2*  PLT  208  260  323    Coag's Recent Labs     11/19/12  0530  11/20/12  0440  11/21/12  0410  INR  2.39*  2.59*  2.42*    BMET Recent Labs     11/19/12  0530  11/20/12  0440  11/21/12  0410  NA  150*  149*  143  K  3.5  3.1*  3.2*  CL  119*  117*  112  CO2  23  24  19   BUN  65*  72*  61*  CREATININE  2.18*  2.39*  1.98*  GLUCOSE  182*  89  281*    Electrolytes Recent Labs     11/19/12  0450  11/19/12  0530  11/20/12  0440  11/21/12  0410  CALCIUM  QUESTIONABLE RESULTS, RECOMMEND RECOLLECT TO VERIFY  12.9*  11.5*  11.8*  MG  QUESTIONABLE RESULTS, RECOMMEND RECOLLECT TO VERIFY  2.4  2.4   --   PHOS  QUESTIONABLE RESULTS, RECOMMEND RECOLLECT TO VERIFY  3.3  4.0   --    Glucose Recent Labs     11/20/12  2300  11/20/12  2358  11/21/12  0105  11/21/12  0211  11/21/12  0306  11/21/12  0450  GLUCAP  129*  118*  109*  122*  133*  201*    Imaging Dg Chest Port 1 View  11/21/2012   *RADIOLOGY REPORT*  Clinical Data: Respiratory failure; diminished lung sounds.  PORTABLE CHEST - 1 VIEW  Comparison: Chest radiograph performed 11/20/2012  Findings: The patient's endotracheal tube is seen ending 4 cm above the carina.  The lungs are relatively well expanded.  Vascular congestion is noted, with diffusely increased interstitial markings,  concerning for persistent pulmonary edema.  Small bilateral pleural effusions are noted.  Findings are redistributed from the prior study, but otherwise grossly unchanged.  No pneumothorax is seen.  The cardiomediastinal silhouette is mildly enlarged.  No acute osseous abnormalities are seen.  The patient's enteric tube is noted extending below the diaphragm.  The right IJ line is noted ending about the distal SVC.  IMPRESSION: Vascular congestion and mild cardiomegaly, with diffusely increased interstitial markings, concerning for persistent pulmonary edema. Small bilateral pleural effusions noted.  There has been mild interval redistribution of airspace opacities, but findings are otherwise grossly unchanged.   Original Report Authenticated By: Tonia Ghent, M.D.   Dg Chest Port 1 View  11/20/2012   *RADIOLOGY REPORT*  Clinical Data: Endotracheal tube placement  PORTABLE CHEST - 1 VIEW  Comparison: Portable exam 1019 hours compared to 11/18/2012  Findings: Rotated to the right. Tip of endotracheal tube projects 5.5 cm above carina. Nasogastric tube extends to at least the inferior mediastinum, unable to see beyond. Enlargement of cardiac silhouette. Tortuous aorta. Scattered infiltrates question edema. Minimal fluid or atelectasis at minor fissure. No gross pneumothorax.  IMPRESSION: Tip of endotracheal tube projects 5.5 cm above carina. Enlargement of cardiac silhouette with scattered infiltrates question edema.   Original Report Authenticated By: Ulyses Southward, M.D.     ASSESSMENT / PLAN:  PULMONARY A: Acute respiratory failure 2nd to pulmonary infiltrates >> CHF +/- aspiration with ?COPD. Failure to wean from ventilator. 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 A fib with RVR. Hx of CAD, HTN, hyperlipidemia. Hypotension episode  7/14. P:  -resume amiodarone per cardiology -plan for cardioversion per cardiology -continue lipitor -continue lopressor >> hold for SBP < 100 -even fluid balance  RENAL A: Acute kidney injury >> improving. Hypernatremia >> improving. Hypokalemia. P:   -continue free water -monitor renal fx, urine outpt -f/u electrolytes -hold diuresis for now  GASTROINTESTINAL A:  Protein malnutrition. Vomiting 7/14. P:   -hold tube feeds for now -add reglan -check KUB -protonix for SUP  HEMATOLOGIC A: Anemia of chronic disease and critical illness. Chronic coumadin therapy for a fib. P:  -f/u CBC -transfuse for Hb < 7 -coumadin per  pharmacy >> hold in anticipation of trach; use heparin gtt when INR < 2  INFECTIOUS A: Aspiration pneumonia. P:   -D12 of Abx -check procalcitonin >> if negative, then d/c Abx soon  ENDOCRINE A:  DM2 P:   -SSI -d/c lantus until sure she can tolerate enteral feedings  NEUROLOGIC A: Acute encephalopathy 2nd to hypoxia/hypercapnia. Muscular deconditioning. P:   -limit sedation while on vent -will need PT/OT when more stable  Discussed with Dr. Antoine Poche.  CC time 35 minutes.  Coralyn Helling, MD [facility] Pulmonary/Critical Care 11/21/2012, 8:01 AM Pager:  [phone] After 3pm call: [phone]

## 2012-11-21 NOTE — Progress Notes (Signed)
Pt BP and SpO2 dropped requiring FiO2 at 100%. MD notified at this time. X-ray ordered.

## 2012-11-21 NOTE — Progress Notes (Signed)
eLink Physician-Brief Progress Note Patient Name: [name] DOB: 27-Nov-1940 MRN: [id number]  Date of Service  11/21/2012   HPI/Events of Note   Agitated -> given fent/versed-> now hypotensivw with sbp 65  eICU Interventions  Fluid bolus cxr morning   Intervention Category Major Interventions: Arrhythmia - evaluation and management Intermediate Interventions: Electrolyte abnormality - evaluation and management Minor Interventions: Other:  [name] 11/21/2012, 4:18 AM

## 2012-11-22 ENCOUNTER — Inpatient Hospital Stay (HOSPITAL_COMMUNITY): Payer: Medicare Other

## 2012-11-22 ENCOUNTER — Encounter (HOSPITAL_COMMUNITY): Payer: Self-pay | Admitting: Radiology

## 2012-11-22 DIAGNOSIS — N189 Chronic kidney disease, unspecified: Secondary | ICD-10-CM

## 2012-11-22 LAB — POCT I-STAT 3, ART BLOOD GAS (G3+)
Acid-base deficit: 10 mmol/L — ABNORMAL HIGH (ref 0.0–2.0)
Bicarbonate: 14.6 mEq/L — ABNORMAL LOW (ref 20.0–24.0)
O2 Saturation: 96 %
TCO2: 15 mmol/L (ref 0–100)
pCO2 arterial: 29.3 mmHg — ABNORMAL LOW (ref 35.0–45.0)
pO2, Arterial: 96 mmHg (ref 80.0–100.0)

## 2012-11-22 LAB — CBC
HCT: 21.2 % — ABNORMAL LOW (ref 36.0–46.0)
Hemoglobin: 7 g/dL — ABNORMAL LOW (ref 12.0–15.0)
MCH: 25.7 pg — ABNORMAL LOW (ref 26.0–34.0)
MCHC: 31.4 g/dL (ref 30.0–36.0)
MCV: 80.6 fL (ref 78.0–100.0)
Platelets: 263 10*3/uL (ref 150–400)
RBC: 3 MIL/uL — ABNORMAL LOW (ref 3.87–5.11)
RBC: 2.63 MIL/uL — ABNORMAL LOW (ref 3.87–5.11)
RDW: 18.5 % — ABNORMAL HIGH (ref 11.5–15.5)
WBC: 23.7 10*3/uL — ABNORMAL HIGH (ref 4.0–10.5)

## 2012-11-22 LAB — APTT: aPTT: 25 seconds (ref 24–37)

## 2012-11-22 LAB — GLUCOSE, CAPILLARY
Glucose-Capillary: 152 mg/dL — ABNORMAL HIGH (ref 70–99)
Glucose-Capillary: 187 mg/dL — ABNORMAL HIGH (ref 70–99)

## 2012-11-22 LAB — BASIC METABOLIC PANEL
BUN: 48 mg/dL — ABNORMAL HIGH (ref 6–23)
Calcium: 11.8 mg/dL — ABNORMAL HIGH (ref 8.4–10.5)
GFR calc non Af Amer: 23 mL/min — ABNORMAL LOW (ref >90)
Glucose, Bld: 157 mg/dL — ABNORMAL HIGH (ref 70–99)
Sodium: 148 mEq/L — ABNORMAL HIGH (ref 135–145)

## 2012-11-22 LAB — PRO B NATRIURETIC PEPTIDE: Pro B Natriuretic peptide (BNP): 9817 pg/mL — ABNORMAL HIGH (ref 0–125)

## 2012-11-22 MED ORDER — HEPARIN (PORCINE) IN NACL 100-0.45 UNIT/ML-% IJ SOLN
1000.0000 [IU]/h | INTRAMUSCULAR | Status: AC
  Administered 2012-11-22: 1000 [IU]/h via INTRAVENOUS
  Administered 2012-11-22: 800 [IU]/h via INTRAVENOUS
  Filled 2012-11-22: qty 250

## 2012-11-22 MED ORDER — FENTANYL CITRATE 0.05 MG/ML IJ SOLN: 50.0000 ug | Freq: Once | INTRAMUSCULAR | Status: DC

## 2012-11-22 MED ORDER — AMIODARONE HCL IN DEXTROSE 360-4.14 MG/200ML-% IV SOLN
INTRAVENOUS | Status: AC
  Administered 2012-11-22: 200 mL
  Filled 2012-11-22: qty 200

## 2012-11-22 MED ORDER — NOREPINEPHRINE BITARTRATE 1 MG/ML IJ SOLN
2.0000 ug/min | INTRAVENOUS | Status: DC
  Administered 2012-11-23: 6 ug/min via INTRAVENOUS
  Filled 2012-11-22 (×2): qty 16

## 2012-11-22 MED ORDER — IPRATROPIUM BROMIDE 0.02 % IN SOLN
0.5000 mg | RESPIRATORY_TRACT | Status: DC | PRN
  Administered 2012-11-25: 0.5 mg via RESPIRATORY_TRACT
  Filled 2012-11-22: qty 2.5

## 2012-11-22 MED ORDER — FENTANYL CITRATE 0.05 MG/ML IJ SOLN
INTRAMUSCULAR | Status: AC
  Administered 2012-11-22: 50 ug
  Filled 2012-11-22: qty 4

## 2012-11-22 MED ORDER — MIDAZOLAM HCL 2 MG/2ML IJ SOLN
INTRAMUSCULAR | Status: AC
  Filled 2012-11-22: qty 4

## 2012-11-22 MED ORDER — QUETIAPINE FUMARATE 50 MG PO TABS
50.0000 mg | ORAL_TABLET | Freq: Two times a day (BID) | ORAL | Status: DC
  Administered 2012-11-22: 50 mg via ORAL
  Filled 2012-11-22 (×5): qty 1

## 2012-11-22 MED ORDER — DEXTROSE 5 % IV SOLN
2.0000 g | INTRAVENOUS | Status: DC
  Administered 2012-11-22 – 2012-11-30 (×9): 2 g via INTRAVENOUS
  Filled 2012-11-22 (×10): qty 2

## 2012-11-22 MED ORDER — SODIUM CHLORIDE 0.9 % IV SOLN
25.0000 ug/h | INTRAVENOUS | Status: DC
  Administered 2012-11-22 – 2012-11-24 (×2): 50 ug/h via INTRAVENOUS
  Filled 2012-11-22 (×3): qty 50

## 2012-11-22 MED ORDER — IOHEXOL 300 MG/ML  SOLN
25.0000 mL | INTRAMUSCULAR | Status: AC
  Administered 2012-11-22 (×2): 25 mL via ORAL

## 2012-11-22 MED ORDER — CLONAZEPAM 0.1 MG/ML ORAL SUSPENSION: 1.0000 mg | Freq: Two times a day (BID) | ORAL | Status: DC

## 2012-11-22 MED ORDER — DEXTROSE 5 % IV SOLN
INTRAVENOUS | Status: DC
  Administered 2012-11-22 – 2012-11-23 (×2): via INTRAVENOUS
  Administered 2012-11-24: 20 mL via INTRAVENOUS
  Filled 2012-11-22: qty 1000

## 2012-11-22 MED ORDER — CLONAZEPAM 1 MG PO TABS
1.0000 mg | ORAL_TABLET | Freq: Two times a day (BID) | ORAL | Status: DC
  Administered 2012-11-22 – 2012-11-23 (×2): 1 mg
  Filled 2012-11-22 (×2): qty 1

## 2012-11-22 MED ORDER — FAMOTIDINE IN NACL 20-0.9 MG/50ML-% IV SOLN
20.0000 mg | INTRAVENOUS | Status: DC
  Administered 2012-11-22 – 2012-11-26 (×5): 20 mg via INTRAVENOUS
  Filled 2012-11-22 (×7): qty 50

## 2012-11-22 MED ORDER — ALBUTEROL SULFATE (5 MG/ML) 0.5% IN NEBU
2.5000 mg | INHALATION_SOLUTION | RESPIRATORY_TRACT | Status: DC | PRN
  Administered 2012-11-25: 2.5 mg via RESPIRATORY_TRACT
  Filled 2012-11-22: qty 0.5

## 2012-11-22 MED ORDER — AMIODARONE HCL IN DEXTROSE 360-4.14 MG/200ML-% IV SOLN
30.0000 mg/h | INTRAVENOUS | Status: DC
  Administered 2012-11-22: 60 mg/h via INTRAVENOUS
  Administered 2012-11-24: 30 mg/h via INTRAVENOUS
  Filled 2012-11-22 (×27): qty 200

## 2012-11-22 NOTE — Progress Notes (Signed)
PULMONARY  / CRITICAL CARE MEDICINE  Name: [name] MRN: [id number] DOB: 11/03/1940    ADMISSION DATE:  11/09/2012 CONSULTATION DATE:  11/09/2012  REFERRING MD :  Preston Fleeting PRIMARY SERVICE: PCCM  CHIEF COMPLAINT:  Shortness of breath  BRIEF PATIENT DESCRIPTION: 72 y/o female with CHF was admitted on 7/3 from the [facility] ED with acute hypoxemic respiratory failure due to a CHF exacerbation, ? Superimposed PNA  LINES / TUBES: 7/3 ETT >>11/13/12, 11/13/12  (failed extubation immediately, ? aspirated) >> 7/4 CVC R IJ >>  7/5 A-line >>  CULTURES: 7/4 U strep >> neg 7/4 legionella >> neg 7/3 mrsa PCR >> POSITIVE 7/8 mini-BAL >> yeast 7/16 sputum >>  7/16 blood >> 7/16 urine >>   ANTIBIOTICS: 7/3 Ceftriaxone >>7/6 7/4 levofloxacin >>7/7 7/4 Vancomycin >>7/6, restarted 7/8 >> 7/7 Unasyn >> 7/16 7/16 cefepime >>   SIGNIFICANT EVENTS: 7/3 admission 7/07 reintubated immediately after extubation.  ? Aspiration. ABX restarted.  on Neo-Synephrine.  7/09 Able to wean fio2 and neo. Lopressor required for rate control of afib. 7/10 Off neo, but now tachycardic.  Failed SBT this AM, but RT felt it was due to sedation.  Sedation weaned 7/14 Hypotensive, vomiting, amiodarone turned off for low heart rate 7/16 Change to pressure control  STUDIES:  SUBJECTIVE: Unable to tolerate pressure support.  Remains on pressors.  Remains tachycardic.  Blood tinged secretions from ETT.  VITAL SIGNS: Temp:  [97.3 F (36.3 C)-99.5 F (37.5 C)] 99 F (37.2 C) (07/16 0700) Pulse Rate:  [78-171] 127 (07/16 0700) Resp:  [19-40] 27 (07/16 0700) BP: (56-185)/(33-132) 85/52 mmHg (07/16 0700) SpO2:  [90 %-100 %] 100 % (07/16 0700) Arterial Line BP: (57-181)/(49-130) 95/78 mmHg (07/16 0700) FiO2 (%):  [40 %-80 %] 40 % (07/16 0302) Weight:  [183 lb 13.8 oz (83.4 kg)] 183 lb 13.8 oz (83.4 kg) (07/16 0400)  HEMODYNAMICS: CVP:  [12 mmHg-14 mmHg] 14 mmHg  VENTILATOR SETTINGS: Vent Mode:  [-] PRVC FiO2 (%):   [40 %-80 %] 40 % Set Rate:  [26 bmp] 26 bmp Vt Set:  [500 mL] 500 mL PEEP:  [10 cmH20] 10 cmH20 Plateau Pressure:  [24 cmH20-28 cmH20] 24 cmH20  INTAKE / OUTPUT: Intake/Output     07/15 0701 - 07/16 0700 07/16 0701 - 07/17 0700   I.V. (mL/kg) 2662.9 (31.9)    Other     NG/GT 250    IV Piggyback 800    Total Intake(mL/kg) 3712.9 (44.5)    Urine (mL/kg/hr) 1380 (0.7)    Stool 2 (0)    Total Output 1382     Net +2330.9           PHYSICAL EXAMINATION: Gen: No distress HEENT: ETT in place PULM: scattered rhonchi CV: irregular, tachycardic AB: soft, non tender GU: uterine prolapse Ext: no edema Derm: no rash Neuro: RASS -1, moves extremities  LABS: CBC Recent Labs    11/20/12  11/21/12  0410  11/22/12  0515  WBC  16.9*  26.0*  28.1*  HGB  8.6*  8.6*  7.7*  HCT  27.5*  27.2*  24.5*  PLT  260  323  263    Coag's Recent Labs     11/20/12  0440  11/21/12  0410  11/22/12  0515  APTT   --    --   25  INR  2.59*  2.42*  1.93*    BMET Recent Labs     11/20/12  0440  11/21/12  0410  11/22/12  0515  NA  149*  143  148*  K  3.1*  3.2*  3.8  CL  117*  112  119*  CO2  24  19  19   BUN  72*  61*  48*  CREATININE  2.39*  1.98*  2.03*  GLUCOSE  89  281*  157*    Electrolytes Recent Labs     11/20/12  0440  11/21/12  0410  11/22/12  0515  CALCIUM  11.5*  11.8*  11.8*  MG  2.4   --    --   PHOS  4.0   --    --    Glucose Recent Labs     11/21/12  0734  11/21/12  1130  11/21/12  1641  11/21/12  1934  11/22/12  0004  11/22/12  0416  GLUCAP  127*  96  142*  156*  152*  110*    Imaging Dg Chest Port 1 View  11/22/2012   *RADIOLOGY REPORT*  Clinical Data: CHF, ventilatory support  PORTABLE CHEST - 1 VIEW  Comparison: 11/21/2012  Findings: Stable support apparatus.  Cardiomegaly with a similar pattern of diffuse edema.  No enlarging effusion or pneumothorax. No significant interval change.  IMPRESSION: Stable CHF pattern.   Original Report Authenticated  By: Judie Petit. Miles Costain, M.D.   Dg Chest Port 1 View  11/21/2012   *RADIOLOGY REPORT*  Clinical Data: Respiratory failure; diminished lung sounds.  PORTABLE CHEST - 1 VIEW  Comparison: Chest radiograph performed 11/20/2012  Findings: The patient's endotracheal tube is seen ending 4 cm above the carina.  The lungs are relatively well expanded.  Vascular congestion is noted, with diffusely increased interstitial markings, concerning for persistent pulmonary edema.  Small bilateral pleural effusions are noted.  Findings are redistributed from the prior study, but otherwise grossly unchanged.  No pneumothorax is seen.  The cardiomediastinal silhouette is mildly enlarged.  No acute osseous abnormalities are seen.  The patient's enteric tube is noted extending below the diaphragm.  The right IJ line is noted ending about the distal SVC.  IMPRESSION: Vascular congestion and mild cardiomegaly, with diffusely increased interstitial markings, concerning for persistent pulmonary edema. Small bilateral pleural effusions noted.  There has been mild interval redistribution of airspace opacities, but findings are otherwise grossly unchanged.   Original Report Authenticated By: Tonia Ghent, M.D.   Dg Chest Port 1 View  11/20/2012   *RADIOLOGY REPORT*  Clinical Data: Endotracheal tube placement  PORTABLE CHEST - 1 VIEW  Comparison: Portable exam 1019 hours compared to 11/18/2012  Findings: Rotated to the right. Tip of endotracheal tube projects 5.5 cm above carina. Nasogastric tube extends to at least the inferior mediastinum, unable to see beyond. Enlargement of cardiac silhouette. Tortuous aorta. Scattered infiltrates question edema. Minimal fluid or atelectasis at minor fissure. No gross pneumothorax.  IMPRESSION: Tip of endotracheal tube projects 5.5 cm above carina. Enlargement of cardiac silhouette with scattered infiltrates question edema.   Original Report Authenticated By: Ulyses Southward, M.D.   Dg Abd Portable 1v  11/21/2012    *RADIOLOGY REPORT*  Clinical Data: Evaluate vomiting.  PORTABLE ABDOMEN - 1 VIEW  Comparison: 11/13/2012.  Findings: Enteric tube has been placed with its tip in the stomach. Bowel gas pattern is nonobstructive.  Within limits of evaluation on this supine radiograph, no visible free air.  IMPRESSION: Enteric tube tip lies along the greater curvature of the stomach.   Original Report Authenticated By: Davonna Belling, M.D.     ASSESSMENT / PLAN:  PULMONARY A: Acute respiratory failure 2nd to pulmonary infiltrates >> ? Cause. Failure to wean from ventilator. 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 A fib with RVR. Hx of CAD, HTN, hyperlipidemia. Hypotension. P:  -okay to resume amiodarone per cardiology -plan for cardioversion per cardiology after tracheostomy  -continue lipitor -even fluid balance -solu cortef added 7/15 for ?relative adrenal insufficiency -wean of levophed to keep SBP > 90, MAP > 65  RENAL A: Acute kidney injury >> renal fx stable. Hypernatremia. Hypokalemia. Hypercalcemia. P:   -change IV fluid to D5W at 50 ml/hr -monitor renal fx, urine outpt -f/u electrolytes -hold diuresis -check PTH  GASTROINTESTINAL A:  Protein malnutrition. Vomiting 7/14. P:   -hold tube feeds for now -CT abd/pelvis w/o contrast -Pepcid for SUP  GYN A: Uterine prolapse. P: -check CT abd/pelvis >> may need Gyn evaluation  HEMATOLOGIC A: Anemia of chronic disease and critical illness. Chronic coumadin therapy for a fib. P:  -f/u CBC -transfuse for Hb < 7 -coumadin per pharmacy >> hold in anticipation of trach; use heparin gtt when INR < 2  INFECTIOUS A: Initial concern for aspiration pneumonia >> has persistent pulmonary infiltrates, low grade temperature, and increasing WBC. P:   -D13 of Abx >> continue  vancomycin, start cefepime, and d/c unasyn -repeat blood, sputum, urine cx  ENDOCRINE A:  DM2 P:   -SSI -d/c lantus until sure she can tolerate enteral feedings  NEUROLOGIC A: Acute encephalopathy 2nd to hypoxia/hypercapnia. Muscular deconditioning. P:   -limit sedation while on vent -will need PT/OT when more stable   CC time 35 minutes.  Coralyn Helling, MD [facility] Pulmonary/Critical Care 11/22/2012, 7:52 AM Pager:  [phone] After 3pm call: [phone]

## 2012-11-22 NOTE — Progress Notes (Signed)
ANTICOAGULATION and ANTIBIOTIC CONSULT NOTE - Follow Up Consult  Pharmacy Consult for heparin, cefepime and vancomycin Indication: atrial fibrillation, PNA  No Known Allergies  Patient Measurements: Height: 5\' 2"  (157.5 cm) Weight: 183 lb 13.8 oz (83.4 kg) IBW/kg (Calculated) : 50.1 Heparin dosing weight= 68kg  Vital Signs: Temp: 99.2 F (37.3 C) (07/16 1619) Temp src: Oral (07/16 1619) BP: 139/90 mmHg (07/16 2200) Pulse Rate: 72 (07/16 2200)  Labs:  Recent Labs  11/20/12 0440 11/21/12 0410 11/22/12 0515 11/22/12 2130  HGB  --  8.6* 7.7* 7.0*  HCT  --  27.2* 24.5* 21.2*  PLT  --  323 263 253  APTT  --   --  25  --   LABPROT 26.9* 25.5* 21.5*  --   INR 2.59* 2.42* 1.93*  --   HEPARINUNFRC  --   --   --  0.15*  CREATININE 2.39* 1.98* 2.03*  --     Estimated Creatinine Clearance: 25.1 ml/min (by C-G formula based on Cr of 2.03).   Medications:  Scheduled:  . antiseptic oral rinse  15 mL Mouth Rinse BID  . ceFEPime (MAXIPIME) IV  2 g Intravenous Q24H  . chlorhexidine  15 mL Mouth/Throat BID  . clonazePAM  1 mg Per Tube BID  . etomidate  40 mg Intravenous Once  . famotidine (PEPCID) IV  20 mg Intravenous Q24H  . fentaNYL  50 mcg Intravenous Once  . fentaNYL  50 mcg Intravenous Once  . hydrocortisone sod succinate (SOLU-CORTEF) inj  50 mg Intravenous Q6H  . insulin aspart  0-20 Units Subcutaneous Q4H  . metoprolol  5 mg Intravenous Q6H  . midazolam      . midazolam  4 mg Intravenous Once  . propofol  5-70 mcg/kg/min Intravenous Once  . QUEtiapine  50 mg Oral BID  . vancomycin  750 mg Intravenous Q24H  . vecuronium  10 mg Intravenous Once  . Warfarin - Pharmacist Dosing Inpatient   Does not apply q1800    Assessment: 61 YOF with hx of Afib on chronic warfarin therapy. Coumadin is now on hold for trach (patient noted s/p vitamin K 1mg  IV on 7/15).  Patient for DCCV once trach has been placed. Heparin was started this morning and her heparin level is  subtherapeutic.  Patient also on cefepime and vancomycin for concern of aspiration PNA.  Her Vancomycin trough is therapeutic on her current regimen.  Goal of Therapy:  Heparin level 0.3-0.7 units/ml Monitor platelets by anticoagulation protocol: Yes Vancomycin trough= 15-20    Plan:  Increase Heparin to 1000 units/hr Heparin stops at 0400 7/17 for trach.  Will follow-up post-procedure for heparin restart. Continue Vancomycin 750mg  IV q24h  Estella Husk, Pharm.D., BCPS, AAHIVP Clinical Pharmacist Phone: [phone] or [phone] 11/22/2012, 11:09 PM

## 2012-11-22 NOTE — Procedures (Signed)
**Note De-Identified [name] Obfuscation** Arterial Catheter Insertion Procedure Note [name] [id number] 1940-12-16  Procedure: Insertion of Arterial Catheter  Indications: Blood pressure monitoring and Frequent blood sampling  Procedure Details Consent: Unable to obtain consent because of altered level of consciousness. and patient sedated on ventilator. Time Out: Verified patient identification, verified procedure, site/side was marked, verified correct patient position, special equipment/implants available, medications/allergies/relevent history reviewed, required imaging and test results available.  Performed  Maximum sterile technique was used including antiseptics, gloves, hand hygiene and sheet. Skin prep: Chlorhexidine; local anesthetic administered 20 gauge catheter was inserted into left radial artery using the Seldinger technique.  Evaluation Blood flow good; BP tracing good. Complications: No apparent complications.   [name], Megan Salon 11/22/2012

## 2012-11-22 NOTE — Progress Notes (Signed)
SUBJECTIVE:  Intubated and opens eyes.  Indicates no pain.    PHYSICAL EXAM Filed Vitals:   11/22/12 0545 11/22/12 0600 11/22/12 0700 11/22/12 0949  BP:   85/52 105/86  Pulse: 139 124 127 121  Temp: 99.5 F (37.5 C) 99.5 F (37.5 C) 99 F (37.2 C)   TempSrc:      Resp: 32 32 27 27  Height:      Weight:      SpO2: 96% 98% 100% 96%   General:  No distress Lungs:  Clear Heart:  Irregular Abdomen:  Positive bowel sounds, no rebound no guarding Extremities:  No edema, nonfocal Neuro:  Nonfocal  LABS:  Results for orders placed during the hospital encounter of 11/09/12 (from the past 24 hour(s))  GLUCOSE, CAPILLARY     Status: None   Collection Time    11/21/12 11:30 AM      Result Value Range   Glucose-Capillary 96  70 - 99 mg/dL  POCT I-STAT 3, BLOOD GAS (G3+)     Status: Abnormal   Collection Time    11/21/12  3:32 PM      Result Value Range   pH, Arterial 7.408  7.350 - 7.450   pCO2 arterial 26.4 (*) 35.0 - 45.0 mmHg   pO2, Arterial 69.0 (*) 80.0 - 100.0 mmHg   Bicarbonate 16.8 (*) 20.0 - 24.0 mEq/L   TCO2 18  0 - 100 mmol/L   O2 Saturation 95.0     Acid-base deficit 7.0 (*) 0.0 - 2.0 mmol/L   Patient temperature 36.1 C     Collection site RADIAL, ALLEN'S TEST ACCEPTABLE     Drawn by RT     Sample type ARTERIAL    GLUCOSE, CAPILLARY     Status: Abnormal   Collection Time    11/21/12  4:41 PM      Result Value Range   Glucose-Capillary 142 (*) 70 - 99 mg/dL  GLUCOSE, CAPILLARY     Status: Abnormal   Collection Time    11/21/12  7:34 PM      Result Value Range   Glucose-Capillary 156 (*) 70 - 99 mg/dL   Comment 1 Documented in Chart     Comment 2 Notify RN    GLUCOSE, CAPILLARY     Status: Abnormal   Collection Time    11/22/12 12:04 AM      Result Value Range   Glucose-Capillary 152 (*) 70 - 99 mg/dL  GLUCOSE, CAPILLARY     Status: Abnormal   Collection Time    11/22/12  4:16 AM      Result Value Range   Glucose-Capillary 110 (*) 70 - 99 mg/dL    PROTIME-INR     Status: Abnormal   Collection Time    11/22/12  5:15 AM      Result Value Range   Prothrombin Time 21.5 (*) 11.6 - 15.2 seconds   INR 1.93 (*) 0.00 - 1.49  BASIC METABOLIC PANEL     Status: Abnormal   Collection Time    11/22/12  5:15 AM      Result Value Range   Sodium 148 (*) 135 - 145 mEq/L   Potassium 3.8  3.5 - 5.1 mEq/L   Chloride 119 (*) 96 - 112 mEq/L   CO2 19  19 - 32 mEq/L   Glucose, Bld 157 (*) 70 - 99 mg/dL   BUN 48 (*) 6 - 23 mg/dL   Creatinine, Ser 8.11 (*) 0.50 - 1.10  mg/dL   Calcium 16.1 (*) 8.4 - 10.5 mg/dL   GFR calc non Af Amer 23 (*) >90 mL/min   GFR calc Af Amer 27 (*) >90 mL/min  CBC     Status: Abnormal   Collection Time    11/22/12  5:15 AM      Result Value Range   WBC 28.1 (*) 4.0 - 10.5 K/uL   RBC 3.00 (*) 3.87 - 5.11 MIL/uL   Hemoglobin 7.7 (*) 12.0 - 15.0 g/dL   HCT 09.6 (*) 04.5 - 40.9 %   MCV 81.7  78.0 - 100.0 fL   MCH 25.7 (*) 26.0 - 34.0 pg   MCHC 31.4  30.0 - 36.0 g/dL   RDW 81.1 (*) 91.4 - 78.2 %   Platelets 263  150 - 400 K/uL  APTT     Status: None   Collection Time    11/22/12  5:15 AM      Result Value Range   aPTT 25  24 - 37 seconds  PROCALCITONIN     Status: None   Collection Time    11/22/12  5:15 AM      Result Value Range   Procalcitonin 1.00    GLUCOSE, CAPILLARY     Status: Abnormal   Collection Time    11/22/12  7:35 AM      Result Value Range   Glucose-Capillary 102 (*) 70 - 99 mg/dL    Intake/Output Summary (Last 24 hours) at 11/22/12 1025 Last data filed at 11/22/12 0700  Gross per 24 hour  Intake 3291.65 ml  Output   1246 ml  Net 2045.65 ml    ASSESSMENT AND PLAN:  Acute on chronic diastolic congestive heart failure:  Volume is back up with some edema on CXR.  Greater than 4 liters positive.  Pressure remains labile.  No diuresis at this time.    CHRONIC KIDNEY DISEASE STAGE V:  Creat coming down with increased volume.   FIBRILLATION, ATRIAL:  Off of amiodarone secondary to  hypotension.  I will resume this as I would like this for rate control and possibly for rhythm control if we DCCV.  Warfarin has been reversed for trach today.  If we can bridge her with heparin and have less than 48 hours with subtherapeutic INR/no heparin I would consider DCCV in the next few days if rate is still hard to control.  Holding on this for now.    Acute respiratory failure with hypoxia:  Trach per [employer] 11/22/2012 10:25 AM

## 2012-11-22 NOTE — Progress Notes (Addendum)
ANTICOAGULATION and ANTIBIOTIC CONSULT NOTE - Follow Up Consult  Pharmacy Consult for heparin, cefepime and vancomycin Indication: atrial fibrillation, PNA  No Known Allergies  Patient Measurements: Height: 5\' 2"  (157.5 cm) Weight: 183 lb 13.8 oz (83.4 kg) IBW/kg (Calculated) : 50.1 Heparin dosing weight= 68kg  Vital Signs: Temp: 99 F (37.2 C) (07/16 0700) BP: 85/52 mmHg (07/16 0700) Pulse Rate: 127 (07/16 0700)  Labs:  Recent Labs  11/20/12 11/20/12 0440 11/21/12 0410 11/22/12 0515  HGB 8.6*  --  8.6* 7.7*  HCT 27.5*  --  27.2* 24.5*  PLT 260  --  323 263  APTT  --   --   --  25  LABPROT  --  26.9* 25.5* 21.5*  INR  --  2.59* 2.42* 1.93*  CREATININE  --  2.39* 1.98* 2.03*    Estimated Creatinine Clearance: 25.1 ml/min (by C-G formula based on Cr of 2.03).   Medications:  Scheduled:  . antiseptic oral rinse  15 mL Mouth Rinse BID  . chlorhexidine  15 mL Mouth/Throat BID  . etomidate  40 mg Intravenous Once  . famotidine (PEPCID) IV  20 mg Intravenous Q24H  . fentaNYL  200 mcg Intravenous Once  . hydrocortisone sod succinate (SOLU-CORTEF) inj  50 mg Intravenous Q6H  . insulin aspart  0-20 Units Subcutaneous Q4H  . metoprolol  5 mg Intravenous Q6H  . midazolam  4 mg Intravenous Once  . propofol  5-70 mcg/kg/min Intravenous Once  . vancomycin  750 mg Intravenous Q24H  . vecuronium  10 mg Intravenous Once  . Warfarin - Pharmacist Dosing Inpatient   Does not apply q1800    Assessment: 53 YOF with hx of Afib on chronic warfarin therapy. Coumadin is now on hold for trach (patient noted s/p vitamin K 1mg  IV on 7/15).  Patient for DCCV once trach has been placed. Heparin to start when INR < 2.0 and todays INR= 1.93.  Patient also on unasyn and vancomycin for concern of aspiration PNA. WBC= 28, tmax= 99.6 and patient noted with worsening infiltrates and to change from unasyn to cefepime today.  CTX 7/3 >>7/6 LVQ 7/4 >>7/7 Vanc 7/4 >>7/6, 7/8>>  Unasyn 7/7  (aspiration)>> 7/16 7/16 cefepime  7/11 VT= 26.1  7/3 MRSA PCR- pos 7/4 BCx -neg 7/3 UCx -neg 7/8 BAL- candida 7/16 blood x2 7/16 urine 7/16 resp   Goal of Therapy:  INR 2-3 Monitor platelets by anticoagulation protocol: Yes Vancomycin trough= 15-20    Plan:  -No heparin bolus due to elevated INR -Will begin heparin infusion at 800 units/hr (~ 12 units/kg/her) -Heparin level in 8 hrs and daily with CBC daily -Cefepime 2gm IV q24hr -Will check a vancomycin trough tonight  Harland German, Pharm D 11/22/2012 9:00 AM

## 2012-11-23 ENCOUNTER — Inpatient Hospital Stay (HOSPITAL_COMMUNITY): Payer: Medicare Other

## 2012-11-23 ENCOUNTER — Encounter (HOSPITAL_COMMUNITY): Payer: Medicare Other

## 2012-11-23 LAB — POCT I-STAT 3, ART BLOOD GAS (G3+)
TCO2: 19 mmol/L (ref 0–100)
pCO2 arterial: 33 mmHg — ABNORMAL LOW (ref 35.0–45.0)
pH, Arterial: 7.348 — ABNORMAL LOW (ref 7.350–7.450)

## 2012-11-23 LAB — GLUCOSE, CAPILLARY
Glucose-Capillary: 109 mg/dL — ABNORMAL HIGH (ref 70–99)
Glucose-Capillary: 163 mg/dL — ABNORMAL HIGH (ref 70–99)
Glucose-Capillary: 143 mg/dL — ABNORMAL HIGH (ref 70–99)

## 2012-11-23 LAB — URINE CULTURE: Colony Count: 50000

## 2012-11-23 LAB — CBC
HCT: 21.3 % — ABNORMAL LOW (ref 36.0–46.0)
HCT: 21.8 % — ABNORMAL LOW (ref 36.0–46.0)
Hemoglobin: 6.9 g/dL — CL (ref 12.0–15.0)
MCH: 26.1 pg (ref 26.0–34.0)
MCHC: 32.4 g/dL (ref 30.0–36.0)
MCHC: 33 g/dL (ref 30.0–36.0)
MCV: 79 fL (ref 78.0–100.0)
RDW: 18.6 % — ABNORMAL HIGH (ref 11.5–15.5)
RDW: 17.4 % — ABNORMAL HIGH (ref 11.5–15.5)
WBC: 22.3 10*3/uL — ABNORMAL HIGH (ref 4.0–10.5)

## 2012-11-23 LAB — RENAL FUNCTION PANEL
CO2: 19 mEq/L (ref 19–32)
GFR calc Af Amer: 29 mL/min — ABNORMAL LOW (ref >90)
GFR calc non Af Amer: 25 mL/min — ABNORMAL LOW (ref >90)
Glucose, Bld: 194 mg/dL — ABNORMAL HIGH (ref 70–99)
Phosphorus: 3.9 mg/dL (ref 2.3–4.6)
Potassium: 3.4 mEq/L — ABNORMAL LOW (ref 3.5–5.1)
Sodium: 142 mEq/L (ref 135–145)

## 2012-11-23 LAB — APTT: aPTT: 29 seconds (ref 24–37)

## 2012-11-23 LAB — PARATHYROID HORMONE, INTACT (NO CA): PTH: 678.6 pg/mL — ABNORMAL HIGH (ref 14.0–72.0)

## 2012-11-23 LAB — PROCALCITONIN: Procalcitonin: 2.52 ng/mL

## 2012-11-23 MED ORDER — SODIUM CHLORIDE 0.9 % IJ SOLN
10.0000 mL | Freq: Two times a day (BID) | INTRAMUSCULAR | Status: DC
  Administered 2012-11-23 – 2012-12-01 (×10): 10 mL
  Administered 2012-12-01: 20 mL
  Administered 2012-12-02 – 2012-12-06 (×9): 10 mL
  Administered 2012-12-06: 20 mL
  Administered 2012-12-07 – 2012-12-09 (×5): 10 mL

## 2012-11-23 MED ORDER — SODIUM CHLORIDE 0.9 % IV SOLN
INTRAVENOUS | Status: DC
  Administered 2012-11-29: 01:00:00 via INTRAVENOUS
  Administered 2012-12-01: 20 mL/h via INTRAVENOUS
  Administered 2012-12-03: 10 mL/h via INTRAVENOUS

## 2012-11-23 MED ORDER — MIDAZOLAM HCL 2 MG/2ML IJ SOLN
INTRAMUSCULAR | Status: AC
  Filled 2012-11-23: qty 2

## 2012-11-23 MED ORDER — SUCCINYLCHOLINE CHLORIDE 20 MG/ML IJ SOLN
INTRAMUSCULAR | Status: AC
  Filled 2012-11-23: qty 1

## 2012-11-23 MED ORDER — MIDAZOLAM HCL 2 MG/2ML IJ SOLN
INTRAMUSCULAR | Status: AC
  Administered 2012-11-23: 2 mg
  Filled 2012-11-23: qty 2

## 2012-11-23 MED ORDER — HEPARIN (PORCINE) IN NACL 100-0.45 UNIT/ML-% IJ SOLN
1000.0000 [IU]/h | INTRAMUSCULAR | Status: DC
  Administered 2012-11-23: 1000 [IU]/h via INTRAVENOUS
  Filled 2012-11-23 (×2): qty 250

## 2012-11-23 MED ORDER — LIDOCAINE HCL (CARDIAC) 20 MG/ML IV SOLN
INTRAVENOUS | Status: AC
  Filled 2012-11-23: qty 5

## 2012-11-23 MED ORDER — ETOMIDATE 2 MG/ML IV SOLN
INTRAVENOUS | Status: AC
  Administered 2012-11-23: 20 mg
  Filled 2012-11-23: qty 20

## 2012-11-23 MED ORDER — FENTANYL CITRATE 0.05 MG/ML IJ SOLN: 25.0000 ug | INTRAMUSCULAR | Status: DC | PRN

## 2012-11-23 MED ORDER — SODIUM CHLORIDE 0.9 % IJ SOLN: 10.0000 mL | INTRAMUSCULAR | Status: DC | PRN

## 2012-11-23 MED ORDER — ROCURONIUM BROMIDE 50 MG/5ML IV SOLN
INTRAVENOUS | Status: AC
  Administered 2012-11-23: 30 mg
  Filled 2012-11-23: qty 2

## 2012-11-23 NOTE — Progress Notes (Signed)
CRITICAL VALUE ALERT  Critical value received:  Hemoglobin 6.9  Date of notification:  11/23/2012  Time of notification:  0456  Critical value read back:yes  Nurse who received alert:  [place], RN  MD notified (1st page):  Dr. Marchelle Gearing  Time of first page:  [phone]  Responding MD:  Dr. Marchelle Gearing  Time MD responded:  [phone]

## 2012-11-23 NOTE — Progress Notes (Signed)
eLink Physician-Brief Progress Note Patient Name: LARCENIA HOLADAY DOB: 05-07-1941 MRN: [id number]  Date of Service  11/23/2012   HPI/Events of Note    Recent Labs Lab 11/20/12 11/21/12 0410 11/22/12 0515 11/22/12 2130 11/23/12 0419  HGB 8.6* 8.6* 7.7* 7.0* 6.9*     eICU Interventions  1 unit prbc for anemia of critical illnes   Intervention Category Intermediate Interventions: Diagnostic test evaluation  Lucely Leard 11/23/2012, 5:02 AM

## 2012-11-23 NOTE — Procedures (Signed)
Bronchoscopy Procedure Note CALIA NAPP [id number] 1940-07-26  Procedure: Bronchoscopy Indications: To facilitate percutaneous tracheostomy placement  Procedure Details Consent: Risks of procedure as well as the alternatives and risks of each were explained to the (patient/caregiver).  Consent for procedure obtained. Time Out: Verified patient identification, verified procedure, site/side was marked, verified correct patient position, special equipment/implants available, medications/allergies/relevent history reviewed, required imaging and test results available.  Performed  In preparation for procedure, patient was given 100% FiO2. Sedation: Benzodiazepines, Muscle relaxants and Etomidate The FOB was introduced via ETT and secretions suctioned. The ETT was withdrawn to ~15cm and visualization of the trach placement was achieved. Please refer to Dr Gwendolyn Grant trach procedure note for details. The needle and guidewire were introduced without evident injury to the posterior wall of the trachea. Inspection through the trach after the procedure showed good hemostasis.   Bronchoscope removed.  , Patient placed back on 100% FiO2 at conclusion of procedure.    Evaluation Hemodynamic Status: BP stable throughout; O2 sats: transiently fell during during procedure and currently acceptable Patient's Current Condition: stable Specimens:  None Complications: No apparent complications Patient did tolerate procedure well.  Levy Pupa, MD, PhD 11/23/2012, 4:00 PM [place] [facility] [phone] or if no answer [phone]

## 2012-11-23 NOTE — Progress Notes (Addendum)
Addendum: Received call from Pat, RN stating that she was not able to start heparin during her shift.  She has asked the next RN to resume the heparin.  Will follow-up start time to determine appropriate timing of f/u heparin levels.  [place] PharmD BCPS [phone] 11/23/2012 8:06 PM   ANTICOAGULATION CONSULT NOTE - Follow Up Consult  Pharmacy Consult for heparin Indication: atrial fibrillation  No Known Allergies  Patient Measurements: Height: 5\' 2"  (157.5 cm) Weight: 184 lb 11.9 oz (83.8 kg) IBW/kg (Calculated) : 50.1 Heparin dosing weight= 68kg  Vital Signs: Temp: 96.8 F (36 C) (07/17 1800) Temp src: Oral (07/17 1800) BP: 147/89 mmHg (07/17 1627) Pulse Rate: 68 (07/17 1800)  Labs:  Recent Labs  11/21/12 0410 11/22/12 0515 11/22/12 2130 11/23/12 0419 11/23/12 1355  HGB 8.6* 7.7* 7.0* 6.9*  --   HCT 27.2* 24.5* 21.2* 21.3*  --   PLT 323 263 253 249  --   APTT  --  25  --   --  29  LABPROT 25.5* 21.5*  --  21.3* 21.6*  INR 2.42* 1.93*  --  1.91* 1.94*  HEPARINUNFRC  --   --  0.15*  --   --   CREATININE 1.98* 2.03*  --  1.93*  --     Estimated Creatinine Clearance: 26.5 ml/min (by C-G formula based on Cr of 1.93).   Assessment: 10 YOF with hx of Afib on chronic warfarin therapy. Heparin bridge for trach (placed earlier today).  Heparin has been held since 7/17 at 0400 for trach placement, now to resume per Dr Sung Amabile.  Coumadin remains on hold.  Of note, patient has had bloody respiratory sections as well as hematuria earlier today but just spoke with RN and these are resolved for the time-being.  Only heparin level from this admission was on 7/16 -- 0.15 with heparin at 800 units/hr.  Heparin then increase to 1000 units/hr but no further levels prior to d/c.  Goal of Therapy:  Heparin level 0.3-0.7 units/ml Monitor platelets by anticoagulation protocol: Yes  Plan:  - Resume heparin at 1000 units/hr - Check 8h heparin level - Check daily heparin level and  CBC  Hilbert Briggs L. Illene Bolus, PharmD, BCPS Clinical Pharmacist Pager: [phone] Pharmacy: [phone] 11/23/2012 6:15 PM

## 2012-11-23 NOTE — Progress Notes (Signed)
PULMONARY  / CRITICAL CARE MEDICINE  Name: [name] MRN: [id number] DOB: 1940-11-21    ADMISSION DATE:  11/09/2012 CONSULTATION DATE:  11/09/2012  REFERRING MD :  Preston Fleeting PRIMARY SERVICE: PCCM  CHIEF COMPLAINT:  Shortness of breath  BRIEF PATIENT DESCRIPTION: 72 y/o female with CHF was admitted on 7/3 from the [facility] ED with acute hypoxemic respiratory failure due to a CHF exacerbation, ? Superimposed PNA  LINES / TUBES: 7/3 ETT >>11/13/12, 11/13/12  (failed extubation immediately, ? aspirated) >> 7/4 CVC R IJ >>  7/5 A-line >>  CULTURES: 7/4 U strep >> neg 7/4 legionella >> neg 7/3 mrsa PCR >> POSITIVE 7/8 mini-BAL >> yeast 7/16 sputum >>  7/16 blood >> 7/16 urine >>   ANTIBIOTICS: 7/3 Ceftriaxone >>7/6 7/4 levofloxacin >>7/7 7/4 Vancomycin >>7/6, restarted 7/8 >> 7/7 Unasyn >> 7/16 7/16 cefepime >>   SIGNIFICANT EVENTS: 7/3 admission 7/07 reintubated immediately after extubation.  ? Aspiration. ABX restarted.  on Neo-Synephrine.  7/09 Able to wean fio2 and neo. Lopressor required for rate control of afib. 7/10 Off neo, but now tachycardic.  Failed SBT this AM, but RT felt it was due to sedation.  Sedation weaned 7/14 Hypotensive, vomiting, amiodarone turned off for low heart rate 7/16 Change to pressure control 7/17 Start pressors, PRBC transfusion  STUDIES: 7/16 CT chest >> emphysema, b/l ASD, small effusions 7/16 CT abd/pelvis >> 2.6 x 2 cm nodule in head of pancreas, bone changes suggestive of Paget's disease  SUBJECTIVE: Pressors started.  Blood secretions for ETT.  VITAL SIGNS: Temp:  [97.5 F (36.4 C)-100.9 F (38.3 C)] 98.4 F (36.9 C) (07/17 1015) Pulse Rate:  [51-144] 73 (07/17 1015) Resp:  [17-42] 18 (07/17 1015) BP: (68-231)/(41-176) 125/74 mmHg (07/17 1000) SpO2:  [78 %-100 %] 98 % (07/17 1000) Arterial Line BP: (74-200)/(52-129) 104/87 mmHg (07/17 1015) FiO2 (%):  [40 %-100 %] 60 % (07/17 1000) Weight:  [184 lb 11.9 oz (83.8 kg)] 184 lb 11.9 oz  (83.8 kg) (07/17 0330)  HEMODYNAMICS: CVP:  [8 mmHg-16 mmHg] 10 mmHg  VENTILATOR SETTINGS: Vent Mode:  [-] PCV FiO2 (%):  [40 %-100 %] 60 % Set Rate:  [18 bmp] 18 bmp PEEP:  [8 cmH20] 8 cmH20 Plateau Pressure:  [16 cmH20-22 cmH20] 16 cmH20  INTAKE / OUTPUT: Intake/Output     07/16 0701 - 07/17 0700 07/17 0701 - 07/18 0700   P.O. 600    I.V. (mL/kg) 2669.4 (31.9) 249.2 (3)   Blood  621   NG/GT 60    IV Piggyback 250 100   Total Intake(mL/kg) 3579.4 (42.7) 970.2 (11.6)   Urine (mL/kg/hr) 1155 (0.6) 500 (1.5)   Stool 1 (0)    Total Output 1156 500   Net +2423.4 +470.2        Urine Occurrence 2 x     PHYSICAL EXAMINATION: Gen: No distress HEENT: ETT in place PULM: scattered rhonchi CV: irregular AB: soft, non tender GU: uterine prolapse Ext: no edema Derm: no rash Neuro: RASS -2, moves extremities  LABS: CBC Recent Labs     11/22/12  0515  11/22/12  2130  11/23/12  0419  WBC  28.1*  23.7*  22.3*  HGB  7.7*  7.0*  6.9*  HCT  24.5*  21.2*  21.3*  PLT  263  253  249    Coag's Recent Labs     11/21/12  0410  11/22/12  0515  11/23/12  0419  APTT   --   25   --  INR  2.42*  1.93*  1.91*    BMET Recent Labs     11/21/12  0410  11/22/12  0515  11/23/12  0419  NA  143  148*  142  K  3.2*  3.8  3.4*  CL  112  119*  114*  CO2  19  19  19   BUN  61*  48*  42*  CREATININE  1.98*  2.03*  1.93*  GLUCOSE  281*  157*  194*    Electrolytes Recent Labs     11/21/12  0410  11/22/12  0515  11/23/12  0419  CALCIUM  11.8*  11.8*  11.3*  PHOS   --    --   3.9   Glucose Recent Labs     11/22/12  1142  11/22/12  1600  11/22/12  2120  11/23/12  0017  11/23/12  0341  11/23/12  0748  GLUCAP  135*  187*  109*  163*  148*  166*    Imaging Ct Abdomen Pelvis Wo Contrast  11/22/2012   *RADIOLOGY REPORT*  Clinical Data:  Pulmonary infiltrates with abdominal pain.  CT CHEST, ABDOMEN AND PELVIS WITHOUT CONTRAST  Technique:  Multidetector CT imaging of the  chest, abdomen and pelvis was performed following the standard protocol without IV contrast.  Comparison:   None.  CT CHEST  Findings:  Tracheostomy tube tip is in the trachea above the level of the bifurcation.  NG tube tip is in the distal stomach.  No axillary lymphadenopathy.  Scattered small lymph nodes are seen in the mediastinum.  Hilar lymphadenopathy cannot be excluded on this study without intravenous contrast material.  The heart is enlarged. Coronary artery calcification is noted.  No pericardial effusion.  Small bilateral pleural effusions are evident.  Lung windows show advanced underlying emphysema.  There is a peripheral interstitial and airspace disease with bilateral dependent collapse / consolidation.  The dense airspace consolidation mainly involves the lower lobes.  Bone windows reveal no worrisome lytic or sclerotic osseous lesions.  IMPRESSION: Emphysema with peripheral interstitial and airspace disease associated with bilateral lower lobe collapse / consolidation. Underlying pulmonary edema not excluded.  Small bilateral pleural effusions.  CT ABDOMEN AND PELVIS  Findings:  No focal abnormalities seen in the liver or spleen. Duodenum and adrenal glands are unremarkable.  The gallbladder lumen is filled with high attenuation material which may be from vicarious excretion of previous contrast material.  Right kidney is unremarkable.  There is mild fullness of the left intrarenal collecting system.  2.6 x 2.0 cm apparent soft tissue nodule is seen along the inferior head of pancreas.  This is difficult to assess given motion artifact and lack of intravenous contrast material.  No abdominal aortic aneurysm.  No free fluid in the abdomen.  No evidence for small bowel obstruction.  Imaging through the pelvis shows no free intraperitoneal fluid. Foley catheter decompresses the urinary bladder.  Uterus is surgically absent.  There is no adnexal mass.  There is some diverticular change in the left  colon without diverticulitis. Terminal ileum is normal.  The appendix is normal.  Abnormal soft tissue attenuation is seen at the umbilicus.  There is some mild body wall edema in the region of the pelvis.  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  No worrisome lytic or sclerotic osseous abnormality.  IMPRESSION: 2.6 cm soft tissue nodule along the inferior head of pancreas. This is not completely characterized given  motion artifact and lack of intravenous contrast material. Pancreatic neoplasm not excluded.  Soft tissue attenuation of the umbilicus.  This could be related to infection or scar.  This should be amendable to clinical inspection.  Mild fullness of the left intrarenal collecting system is of indeterminate etiology/significance.  Underlying component of UPJ obstruction would be a consideration.  Mild body wall edema.   Original Report Authenticated By: Kennith Center, M.D.   Ct Chest Wo Contrast  11/22/2012   *RADIOLOGY REPORT*  Clinical Data:  Pulmonary infiltrates with abdominal pain.  CT CHEST, ABDOMEN AND PELVIS WITHOUT CONTRAST  Technique:  Multidetector CT imaging of the chest, abdomen and pelvis was performed following the standard protocol without IV contrast.  Comparison:   None.  CT CHEST  Findings:  Tracheostomy tube tip is in the trachea above the level of the bifurcation.  NG tube tip is in the distal stomach.  No axillary lymphadenopathy.  Scattered small lymph nodes are seen in the mediastinum.  Hilar lymphadenopathy cannot be excluded on this study without intravenous contrast material.  The heart is enlarged. Coronary artery calcification is noted.  No pericardial effusion.  Small bilateral pleural effusions are evident.  Lung windows show advanced underlying emphysema.  There is a peripheral interstitial and airspace disease with bilateral dependent collapse / consolidation.  The dense airspace consolidation mainly involves the lower lobes.   Bone windows reveal no worrisome lytic or sclerotic osseous lesions.  IMPRESSION: Emphysema with peripheral interstitial and airspace disease associated with bilateral lower lobe collapse / consolidation. Underlying pulmonary edema not excluded.  Small bilateral pleural effusions.  CT ABDOMEN AND PELVIS  Findings:  No focal abnormalities seen in the liver or spleen. Duodenum and adrenal glands are unremarkable.  The gallbladder lumen is filled with high attenuation material which may be from vicarious excretion of previous contrast material.  Right kidney is unremarkable.  There is mild fullness of the left intrarenal collecting system.  2.6 x 2.0 cm apparent soft tissue nodule is seen along the inferior head of pancreas.  This is difficult to assess given motion artifact and lack of intravenous contrast material.  No abdominal aortic aneurysm.  No free fluid in the abdomen.  No evidence for small bowel obstruction.  Imaging through the pelvis shows no free intraperitoneal fluid. Foley catheter decompresses the urinary bladder.  Uterus is surgically absent.  There is no adnexal mass.  There is some diverticular change in the left colon without diverticulitis. Terminal ileum is normal.  The appendix is normal.  Abnormal soft tissue attenuation is seen at the umbilicus.  There is some mild body wall edema in the region of the pelvis.  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  No worrisome lytic or sclerotic osseous abnormality.  IMPRESSION: 2.6 cm soft tissue nodule along the inferior head of pancreas. This is not completely characterized given motion artifact and lack of intravenous contrast material. Pancreatic neoplasm not excluded.  Soft tissue attenuation of the umbilicus.  This could be related to infection or scar.  This should be amendable to clinical inspection.  Mild fullness of the left intrarenal collecting system is of indeterminate etiology/significance.   Underlying component of UPJ obstruction would be a consideration.  Mild body wall edema.   Original Report Authenticated By: Kennith Center, M.D.   Dg Chest Port 1 View  11/23/2012   *RADIOLOGY REPORT*  Clinical Data: Pulmonary infiltrates, follow-up  PORTABLE CHEST - 1 VIEW  Comparison: CT  chest of 11/22/2012 and chest x-ray of the same date  Findings: The tip of the endotracheal tube is approximately 5.8 cm above the carina.  The lungs appear slightly better aerated although diffuse airspace disease remains.  Haziness at the lung bases is most consistent with the presence of bilateral pleural effusions, and cardiomegaly is stable.  The right central venous line is unchanged in position.  IMPRESSION: Slightly improved aeration.  Persistent airspace disease with probable effusions.   Original Report Authenticated By: Dwyane Dee, M.D.   Dg Chest Port 1 View  11/22/2012   *RADIOLOGY REPORT*  Clinical Data: CHF, ventilatory support  PORTABLE CHEST - 1 VIEW  Comparison: 11/21/2012  Findings: Stable support apparatus.  Cardiomegaly with a similar pattern of diffuse edema.  No enlarging effusion or pneumothorax. No significant interval change.  IMPRESSION: Stable CHF pattern.   Original Report Authenticated By: Judie Petit. Shick, M.D.     ASSESSMENT / PLAN:  PULMONARY A: Acute respiratory failure 2nd to pulmonary infiltrates >> ? Cause. Failure to wean from ventilator. 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 A fib with RVR. Hx of CAD, HTN, hyperlipidemia. Shock. P:  -amiodarone per cardiology -cardioversion when more stable -hold lipitor -even fluid balance -solu cortef added 7/15 for ?relative adrenal insufficiency -wean of levophed to keep SBP > 90, MAP > 65  RENAL A: Acute kidney injury >> renal fx stable. Hypernatremia. Hypokalemia. Hypercalcemia. P:   -changed IV fluid to D5W  at 50 ml/hr -monitor renal fx, urine outpt -f/u electrolytes -hold diuresis -check PTH  GASTROINTESTINAL A:  Protein malnutrition. Vomiting 7/14. Mass head of pancreas. P:   -hold tube feeds for now -Pepcid for SUP -will need further assessment of pancreatic lesion when more stable  GYN A: Uterine prolapse. P: -GYN eval when more stable  HEMATOLOGIC A: Anemia of chronic disease and critical illness >> PRBC transfusion 7/17. Chronic coumadin therapy for a fib. P:  -f/u CBC -transfuse for Hb < 7 -hold anti-coagulation due to hematuria and blood respiratory secretions  INFECTIOUS A: Initial concern for aspiration pneumonia >> has persistent pulmonary infiltrates, low grade temperature, and increasing WBC. P:   -D14 of Abx >> continue vancomycin, cefepime pending repeat blood, sputum, urine cx  ENDOCRINE A:  DM2 P:   -SSI -d/c lantus until sure she can tolerate enteral feedings  NEUROLOGIC A: Acute encephalopathy 2nd to hypoxia/hypercapnia. Muscular deconditioning. P:   -limit sedation while on vent -will need PT/OT when more stable   CC time 35 minutes.  Coralyn Helling, MD [facility] 11/23/2012, 10:53 AM Pager:  [phone] After 3pm call: [phone]

## 2012-11-23 NOTE — Progress Notes (Signed)
Peripherally Inserted Central Catheter/Midline Placement  The IV Nurse has discussed with the patient and/or persons authorized to consent for the patient, the purpose of this procedure and the potential benefits and risks involved with this procedure.  The benefits include less needle sticks, lab draws from the catheter and patient may be discharged home with the catheter.  Risks include, but not limited to, infection, bleeding, blood clot (thrombus formation), and puncture of an artery; nerve damage and irregular heat beat.  Alternatives to this procedure were also discussed.  PICC/Midline Placement Documentation        Lisabeth Devoid 11/23/2012, 2:21 PM Consent obtained from daughter, Peg Fifer via phone.

## 2012-11-23 NOTE — Procedures (Signed)
Perc trach See full dictation Size 6 placed ffp 4 units given in total  Tolerated well Blood loss less 1 cc  Mcarthur Rossetti. Tyson Alias, MD, FACP Pgr: [phone] [facility]

## 2012-11-24 ENCOUNTER — Inpatient Hospital Stay (HOSPITAL_COMMUNITY): Payer: Medicare Other

## 2012-11-24 LAB — HEPARIN LEVEL (UNFRACTIONATED): Heparin Unfractionated: <0.1 IU/mL — ABNORMAL LOW (ref 0.30–0.70)

## 2012-11-24 LAB — COMPREHENSIVE METABOLIC PANEL
ALT: 28 U/L (ref 0–35)
Albumin: 2.4 g/dL — ABNORMAL LOW (ref 3.5–5.2)
Alkaline Phosphatase: 94 U/L (ref 39–117)
BUN: 32 mg/dL — ABNORMAL HIGH (ref 6–23)
Chloride: 112 mEq/L (ref 96–112)
Glucose, Bld: 122 mg/dL — ABNORMAL HIGH (ref 70–99)
Potassium: 2.8 mEq/L — ABNORMAL LOW (ref 3.5–5.1)
Total Bilirubin: 0.8 mg/dL (ref 0.3–1.2)

## 2012-11-24 LAB — PROTIME-INR: Prothrombin Time: 20.8 seconds — ABNORMAL HIGH (ref 11.6–15.2)

## 2012-11-24 LAB — CBC
HCT: 20.3 % — ABNORMAL LOW (ref 36.0–46.0)
Hemoglobin: 6.8 g/dL — CL (ref 12.0–15.0)
MCH: 27.2 pg (ref 26.0–34.0)
MCV: 78.8 fL (ref 78.0–100.0)
Platelets: 195 10*3/uL (ref 150–400)
RBC: 3.16 MIL/uL — ABNORMAL LOW (ref 3.87–5.11)
RDW: 17.4 % — ABNORMAL HIGH (ref 11.5–15.5)
WBC: 15.1 10*3/uL — ABNORMAL HIGH (ref 4.0–10.5)

## 2012-11-24 LAB — PREPARE FRESH FROZEN PLASMA
Unit division: 0
Unit division: 0

## 2012-11-24 LAB — BLOOD GAS, ARTERIAL
Acid-base deficit: 2.8 mmol/L — ABNORMAL HIGH (ref 0.0–2.0)
FIO2: 60 %
Patient temperature: 98.6
RATE: 20 resp/min
TCO2: 21.4 mmol/L (ref 0–100)
pCO2 arterial: 29.5 mmHg — ABNORMAL LOW (ref 35.0–45.0)

## 2012-11-24 LAB — GLUCOSE, CAPILLARY
Glucose-Capillary: 79 mg/dL (ref 70–99)
Glucose-Capillary: 67 mg/dL — ABNORMAL LOW (ref 70–99)

## 2012-11-24 MED ORDER — POTASSIUM CHLORIDE 10 MEQ/50ML IV SOLN
10.0000 meq | INTRAVENOUS | Status: AC
  Administered 2012-11-24 (×4): 10 meq via INTRAVENOUS
  Filled 2012-11-24: qty 200

## 2012-11-24 MED ORDER — FUROSEMIDE 10 MG/ML IJ SOLN
INTRAMUSCULAR | Status: AC
  Administered 2012-11-24: 20 mg via INTRAVENOUS
  Filled 2012-11-24: qty 4

## 2012-11-24 MED ORDER — CHLORHEXIDINE GLUCONATE 0.12 % MT SOLN
15.0000 mL | Freq: Two times a day (BID) | OROMUCOSAL | Status: DC
  Administered 2012-11-24 – 2012-12-21 (×56): 15 mL via OROMUCOSAL
  Filled 2012-11-24 (×58): qty 15

## 2012-11-24 MED ORDER — HYDROCORTISONE SOD SUCCINATE 100 MG IJ SOLR
50.0000 mg | Freq: Two times a day (BID) | INTRAMUSCULAR | Status: DC
  Administered 2012-11-24 – 2012-11-25 (×3): 50 mg via INTRAVENOUS
  Administered 2012-11-26: 17:00:00 via INTRAVENOUS
  Administered 2012-11-26 – 2012-11-27 (×2): 50 mg via INTRAVENOUS
  Administered 2012-11-27: 17:00:00 via INTRAVENOUS
  Administered 2012-11-28 – 2012-11-29 (×3): 50 mg via INTRAVENOUS
  Filled 2012-11-24 (×12): qty 1

## 2012-11-24 MED ORDER — CHLORHEXIDINE GLUCONATE 0.12 % MT SOLN: 15.0000 mL | Freq: Two times a day (BID) | OROMUCOSAL | Status: DC

## 2012-11-24 MED ORDER — FUROSEMIDE 10 MG/ML IJ SOLN: 20.0000 mg | Freq: Once | INTRAMUSCULAR | Status: AC

## 2012-11-24 MED ORDER — OSMOLITE 1.2 CAL PO LIQD
1000.0000 mL | ORAL | Status: DC
  Administered 2012-11-24 – 2012-12-07 (×8): 1000 mL
  Filled 2012-11-24 (×13): qty 1000

## 2012-11-24 MED ORDER — HEPARIN (PORCINE) IN NACL 100-0.45 UNIT/ML-% IJ SOLN
1050.0000 [IU]/h | INTRAMUSCULAR | Status: DC
  Administered 2012-11-24: 1200 [IU]/h via INTRAVENOUS
  Administered 2012-11-25 – 2012-11-27 (×3): 1050 [IU]/h via INTRAVENOUS
  Filled 2012-11-24 (×9): qty 250

## 2012-11-24 MED ORDER — BIOTENE DRY MOUTH MT LIQD
15.0000 mL | Freq: Four times a day (QID) | OROMUCOSAL | Status: DC
  Administered 2012-11-24 – 2012-12-22 (×113): 15 mL via OROMUCOSAL

## 2012-11-24 NOTE — Progress Notes (Signed)
eLink Physician Progress Note and Electrolyte Replacement  Patient Name: [name] DOB: 03-28-1941 MRN: [id number]  Date of Service  11/24/2012   HPI/Events of Note    Recent Labs Lab 11/18/12 0400 11/19/12 0450 11/19/12 0530 11/20/12 0440 11/21/12 0410 11/22/12 0515 11/23/12 0419 11/24/12 0500  NA 155* QUESTIONABLE RESULTS, RECOMMEND RECOLLECT TO VERIFY 150* 149* 143 148* 142 142  K 3.1* 3.8 3.5 3.1* 3.2* 3.8 3.4* 2.8*  CL 121* QUESTIONABLE RESULTS, RECOMMEND RECOLLECT TO VERIFY 119* 117* 112 119* 114* 112  CO2 27 QUESTIONABLE RESULTS, RECOMMEND RECOLLECT TO VERIFY 23 24 19 19 19 20   GLUCOSE 140* QUESTIONABLE RESULTS, RECOMMEND RECOLLECT TO VERIFY 182* 89 281* 157* 194* 122*  BUN 73* QUESTIONABLE RESULTS, RECOMMEND RECOLLECT TO VERIFY 65* 72* 61* 48* 42* 32*  CREATININE 2.13* QUESTIONABLE RESULTS, RECOMMEND RECOLLECT TO VERIFY 2.18* 2.39* 1.98* 2.03* 1.93* 1.70*  CALCIUM 13.0* QUESTIONABLE RESULTS, RECOMMEND RECOLLECT TO VERIFY 12.9* 11.5* 11.8* 11.8* 11.3* 11.6*  MG 2.5 QUESTIONABLE RESULTS, RECOMMEND RECOLLECT TO VERIFY 2.4 2.4  --   --   --   --   PHOS  --  QUESTIONABLE RESULTS, RECOMMEND RECOLLECT TO VERIFY 3.3 4.0  --   --  3.9  --     Estimated Creatinine Clearance: 30 ml/min (by C-G formula based on Cr of 1.7).  Intake/Output     07/17 0701 - 07/18 0700   I.V. (mL/kg) 1969 (23.5)   Blood 1267   IV Piggyback 250   Total Intake(mL/kg) 3486 (41.6)   Urine (mL/kg/hr) 2210 (1.1)   Total Output 2210   Net +1276       Stool Occurrence 1 x    - I/O DETAILED x 24h    Total I/O In: 1122.2 [I.V.:972.2; IV Piggyback:150] Out: 900 [Urine:900] - I/O THIS SHIFT    ASSESSMENT Low K  eICURN Interventions  kcl x 4 runs x IV   ASSESSMENT: MAJOR ELECTROLYTE      Dr. Kalman Shan, M.D., [facility].C.P Pulmonary and Critical Care Medicine Staff Physician [facility] Pager: [phone], If no answer or between   15:00h - 7:00h: call [phone]  11/24/2012 7:00 AM

## 2012-11-24 NOTE — Significant Event (Signed)
RN discussed with family if pt has been seen by GYN previously.  Family informed that pt has not been seen by GYN.  Consult placed with Dr. Debroah Loop with GYN service to assess vaginal and uterine prolapse.  Coralyn Helling, MD [facility] Pulmonary/Critical Care 11/24/2012, 11:17 AM Pager:  [phone] After 3pm call: [phone]

## 2012-11-24 NOTE — Progress Notes (Signed)
eLink Physician-Brief Progress Note Patient Name: [name] DOB: 1941-04-15 MRN: [id number]  Date of Service  11/24/2012   HPI/Events of Note   hyperglycemia  eICU Interventions  Reduce d5 rate to kvo   Intervention Category Minor Interventions: Other:;Routine modifications to care plan (e.g. PRN medications for pain, fever)  Newton Frutiger 11/24/2012, 12:39 AM

## 2012-11-24 NOTE — Progress Notes (Signed)
SUBJECTIVE:  She is awake.  Indicates no pain.    PHYSICAL EXAM Filed Vitals:   11/24/12 0400 11/24/12 0430 11/24/12 0500 11/24/12 0600  BP: 116/90 116/90 110/67   Pulse: 57 61 52   Temp: 98.5 F (36.9 C)     TempSrc: Oral     Resp: 17 20 20 20   Height:      Weight:      SpO2: 99% 99% 99% 100%   General:  No distress Lungs:  Clear Heart:  Irregular Abdomen:  Positive bowel sounds, no rebound no guarding Extremities:  No edema, nonfocal Neuro:  Nonfocal  LABS:  Results for orders placed during the hospital encounter of 11/09/12 (from the past 24 hour(s))  PREPARE RBC (CROSSMATCH)     Status: None   Collection Time    11/23/12  6:50 AM      Result Value Range   Order Confirmation ORDER PROCESSED BY BLOOD BANK    TYPE AND SCREEN     Status: None   Collection Time    11/23/12  6:50 AM      Result Value Range   ABO/RH(D) A POS     Antibody Screen NEG     Sample Expiration 11/26/2012     Unit Number [id number]     Blood Component Type RBC LR PHER1     Unit division 00     Status of Unit ISSUED     Transfusion Status OK TO TRANSFUSE     Crossmatch Result Compatible     Unit Number [id number]     Blood Component Type RED CELLS,LR     Unit division 00     Status of Unit ISSUED     Transfusion Status OK TO TRANSFUSE     Crossmatch Result Compatible    PREPARE FRESH FROZEN PLASMA     Status: None   Collection Time    11/23/12  7:29 AM      Result Value Range   Unit Number [id number]     Blood Component Type THAWED PLASMA     Unit division 00     Status of Unit ISSUED     Transfusion Status OK TO TRANSFUSE     Unit Number [id number]     Blood Component Type THAWED PLASMA     Unit division 00     Status of Unit ISSUED     Transfusion Status OK TO TRANSFUSE    GLUCOSE, CAPILLARY     Status: Abnormal   Collection Time    11/23/12  7:48 AM      Result Value Range   Glucose-Capillary 166 (*) 70 - 99 mg/dL  POCT I-STAT 3, BLOOD GAS (G3+)     Status:  Abnormal   Collection Time    11/23/12  9:01 AM      Result Value Range   pH, Arterial 7.348 (*) 7.350 - 7.450   pCO2 arterial 33.0 (*) 35.0 - 45.0 mmHg   pO2, Arterial 108.0 (*) 80.0 - 100.0 mmHg   Bicarbonate 18.3 (*) 20.0 - 24.0 mEq/L   TCO2 19  0 - 100 mmol/L   O2 Saturation 98.0     Acid-base deficit 7.0 (*) 0.0 - 2.0 mmol/L   Patient temperature 97.5 F     Collection site RADIAL, ALLEN'S TEST ACCEPTABLE     Drawn by Operator     Sample type ARTERIAL    GLUCOSE, CAPILLARY     Status: Abnormal   Collection Time  11/23/12 11:43 AM      Result Value Range   Glucose-Capillary 143 (*) 70 - 99 mg/dL  PROTIME-INR     Status: Abnormal   Collection Time    11/23/12  1:55 PM      Result Value Range   Prothrombin Time 21.6 (*) 11.6 - 15.2 seconds   INR 1.94 (*) 0.00 - 1.49  APTT     Status: None   Collection Time    11/23/12  1:55 PM      Result Value Range   aPTT 29  24 - 37 seconds  PREPARE FRESH FROZEN PLASMA     Status: None   Collection Time    11/23/12  2:48 PM      Result Value Range   Unit Number [id number]     Blood Component Type THAWED PLASMA     Unit division 00     Status of Unit ISSUED     Transfusion Status OK TO TRANSFUSE     Unit Number [id number]     Blood Component Type THAWED PLASMA     Unit division 00     Status of Unit ISSUED     Transfusion Status OK TO TRANSFUSE    GLUCOSE, CAPILLARY     Status: Abnormal   Collection Time    11/23/12  5:16 PM      Result Value Range   Glucose-Capillary 186 (*) 70 - 99 mg/dL  CBC     Status: Abnormal   Collection Time    11/23/12  6:10 PM      Result Value Range   WBC 19.3 (*) 4.0 - 10.5 K/uL   RBC 2.76 (*) 3.87 - 5.11 MIL/uL   Hemoglobin 7.2 (*) 12.0 - 15.0 g/dL   HCT 30.8 (*) 65.7 - 84.6 %   MCV 79.0  78.0 - 100.0 fL   MCH 26.1  26.0 - 34.0 pg   MCHC 33.0  30.0 - 36.0 g/dL   RDW 96.2 (*) 95.2 - 84.1 %   Platelets 209  150 - 400 K/uL  GLUCOSE, CAPILLARY     Status: Abnormal   Collection Time      11/23/12  8:50 PM      Result Value Range   Glucose-Capillary 196 (*) 70 - 99 mg/dL  GLUCOSE, CAPILLARY     Status: Abnormal   Collection Time    11/24/12 12:06 AM      Result Value Range   Glucose-Capillary 199 (*) 70 - 99 mg/dL  GLUCOSE, CAPILLARY     Status: Abnormal   Collection Time    11/24/12  3:59 AM      Result Value Range   Glucose-Capillary 140 (*) 70 - 99 mg/dL  BLOOD GAS, ARTERIAL     Status: Abnormal   Collection Time    11/24/12  4:44 AM      Result Value Range   FIO2 60.00     Delivery systems VENTILATOR     Mode PRESSURE CONTROL     Rate 20.0     Peep/cpap 8.0     Pressure control 22.00     pH, Arterial 7.456 (*) 7.350 - 7.450   pCO2 arterial 29.5 (*) 35.0 - 45.0 mmHg   pO2, Arterial 143.0 (*) 80.0 - 100.0 mmHg   Bicarbonate 20.5  20.0 - 24.0 mEq/L   TCO2 21.4  0 - 100 mmol/L   Acid-base deficit 2.8 (*) 0.0 - 2.0 mmol/L   O2 Saturation 99.9  Patient temperature 98.6     Collection site A-LINE     Drawn by [phone]     Sample type ARTERIAL    CBC     Status: Abnormal   Collection Time    11/24/12  5:00 AM      Result Value Range   WBC 15.1 (*) 4.0 - 10.5 K/uL   RBC 2.59 (*) 3.87 - 5.11 MIL/uL   Hemoglobin 6.8 (*) 12.0 - 15.0 g/dL   HCT 60.4 (*) 54.0 - 98.1 %   MCV 78.4  78.0 - 100.0 fL   MCH 26.3  26.0 - 34.0 pg   MCHC 33.5  30.0 - 36.0 g/dL   RDW 19.1 (*) 47.8 - 29.5 %   Platelets 197  150 - 400 K/uL  COMPREHENSIVE METABOLIC PANEL     Status: Abnormal   Collection Time    11/24/12  5:00 AM      Result Value Range   Sodium 142  135 - 145 mEq/L   Potassium 2.8 (*) 3.5 - 5.1 mEq/L   Chloride 112  96 - 112 mEq/L   CO2 20  19 - 32 mEq/L   Glucose, Bld 122 (*) 70 - 99 mg/dL   BUN 32 (*) 6 - 23 mg/dL   Creatinine, Ser 6.21 (*) 0.50 - 1.10 mg/dL   Calcium 30.8 (*) 8.4 - 10.5 mg/dL   Total Protein 5.4 (*) 6.0 - 8.3 g/dL   Albumin 2.4 (*) 3.5 - 5.2 g/dL   AST 20  0 - 37 U/L   ALT 28  0 - 35 U/L   Alkaline Phosphatase 94  39 - 117 U/L   Total  Bilirubin 0.8  0.3 - 1.2 mg/dL   GFR calc non Af Amer 29 (*) >90 mL/min   GFR calc Af Amer 34 (*) >90 mL/min  PROTIME-INR     Status: Abnormal   Collection Time    11/24/12  5:00 AM      Result Value Range   Prothrombin Time 20.8 (*) 11.6 - 15.2 seconds   INR 1.85 (*) 0.00 - 1.49    Intake/Output Summary (Last 24 hours) at 11/24/12 0640 Last data filed at 11/24/12 0600  Gross per 24 hour  Intake 3582.4 ml  Output   2285 ml  Net 1297.4 ml    ASSESSMENT AND PLAN:  Acute on chronic diastolic congestive heart failure:  She is 11 liters positive.  Edema on CXR.  Likely would tolerate gentle diuresis now.  Give Lasix 20 mg after PRBCs.  Potassium was supplemented.   CHRONIC KIDNEY DISEASE STAGE V:  Creat is much improved.   FIBRILLATION, ATRIAL:  Off of amiodarone secondary to low HR.  Rate is much better off of pressors with trach.  Continue rate control and anticoagulation for now.   Acute respiratory failure with hypoxia:  Status post trach.     Fayrene Fearing [facility] 11/24/2012 6:40 AM

## 2012-11-24 NOTE — Procedures (Signed)
Bedside Tracheostomy Insertion Procedure Note   Patient Details:   Name: [name] DOB: Apr 03, 1941 MRN: [id number]  Procedure: Tracheostomy  Pre Procedure Assessment: ET Tube Size:7.5 ET Tube secured at lip (cm):21 Bite block in place: Yes Breath Sounds: Rhonch  Post Procedure Assessment: BP 110/67  Pulse 61  Temp(Src) 98.3 F (36.8 C) (Oral)  Resp 20  Ht 5\' 2"  (1.575 m)  Wt 191 lb 2.2 oz (86.7 kg)  BMI 34.95 kg/m2  SpO2 100% O2 sats: stable throughout Complications: No apparent complications Patient did tolerate procedure well Tracheostomy Brand:Shiley Tracheostomy Style:Cuffed Tracheostomy Size: 6.0 Tracheostomy Secured WUJ:[id number] and velcor Tracheostomy Placement Confirmation:Trach cuff visualized and in place an d ches x-ray  Procedure tolerated well.  Vent adjustments to alarm setting during the procedure.  Toniann Fail Via, RRT RCP made aware post procedure.  Ruthell Rummage Nanette 11/24/2012, 7:40 AM

## 2012-11-24 NOTE — Progress Notes (Signed)
NUTRITION FOLLOW UP  Intervention:   1.  Enteral nutrition; If pt able to tolerate trickle feeds of Osmolite 1.2 @ 20 mL/hr, recommend advanced to 25 mL/hr goal and resume of protein supplements- Prostat 5 times daily. Nutrition regimen would provide 1220 kcal, 108g protein, and 462 mL free water.   Nutrition Dx:   Inadequate oral intake, ongoing  Monitor:   1.  Enteral nutrition; .Enteral nutrition to provide 60-70% of estimated calorie needs (22-25 kcals/kg ideal body weight) and 100% of estimated protein needs, based on ASPEN guidelines for permissive underfeeding in critically ill obese individuals. 2.  Wt/wt change; monitor trends  Assessment:   Pt admitted with respiratory failure due to CHF exacerbation. Pt remains intubated after episode of vomiting.  S/p bedside trach yesterday.  Per Patient is currently intubated on ventilator support. NGT for access. MV: 12.1 L/min Temp:Temp (24hrs), Avg:97.4 F (36.3 C), Min:94.4 F (34.7 C), Max:98.5 F (36.9 C) Per RN, pt is on full vent support with no expected changes today.   Pt with soft, loose stools since starting reglan.  Now to resume trickle feeds.  MD has ordered Osmolite 1.2 @ 10-20 ml/hr.    Height: Ht Readings from Last 1 Encounters:  11/09/12 5\' 2"  (1.575 m)    Weight Status:   Wt Readings from Last 1 Encounters:  11/24/12 191 lb 2.2 oz (86.7 kg)    Re-estimated needs:  Kcal: 1649 Permissive underfeeding goals:  1150-1250 kcal/d  Protein: >/=100g Fluid: ~1.8 L/day  Skin: intact, non-pitting edema  Diet Order: NPO   Intake/Output Summary (Last 24 hours) at 11/24/12 1108 Last data filed at 11/24/12 1040  Gross per 24 hour  Intake 3584.4 ml  Output   2265 ml  Net 1319.4 ml    Last BM: 7/18   Labs:   Recent Labs Lab 11/19/12 0450 11/19/12 0530 11/20/12 0440  11/22/12 0515 11/23/12 0419 11/24/12 0500  NA QUESTIONABLE RESULTS, RECOMMEND RECOLLECT TO VERIFY 150* 149*  < > 148* 142 142  K 3.8  3.5 3.1*  < > 3.8 3.4* 2.8*  CL QUESTIONABLE RESULTS, RECOMMEND RECOLLECT TO VERIFY 119* 117*  < > 119* 114* 112  CO2 QUESTIONABLE RESULTS, RECOMMEND RECOLLECT TO VERIFY 23 24  < > 19 19 20   BUN QUESTIONABLE RESULTS, RECOMMEND RECOLLECT TO VERIFY 65* 72*  < > 48* 42* 32*  CREATININE QUESTIONABLE RESULTS, RECOMMEND RECOLLECT TO VERIFY 2.18* 2.39*  < > 2.03* 1.93* 1.70*  CALCIUM QUESTIONABLE RESULTS, RECOMMEND RECOLLECT TO VERIFY 12.9* 11.5*  < > 11.8* 11.3* 11.6*  MG QUESTIONABLE RESULTS, RECOMMEND RECOLLECT TO VERIFY 2.4 2.4  --   --   --   --   PHOS QUESTIONABLE RESULTS, RECOMMEND RECOLLECT TO VERIFY 3.3 4.0  --   --  3.9  --   GLUCOSE QUESTIONABLE RESULTS, RECOMMEND RECOLLECT TO VERIFY 182* 89  < > 157* 194* 122*  < > = values in this interval not displayed.  CBG (last 3)   Recent Labs  11/23/12 2050 11/24/12 0006 11/24/12 0359  GLUCAP 196* 199* 140*    Scheduled Meds: . antiseptic oral rinse  15 mL Mouth Rinse QID  . ceFEPime (MAXIPIME) IV  2 g Intravenous Q24H  . chlorhexidine  15 mL Mouth/Throat BID  . famotidine (PEPCID) IV  20 mg Intravenous Q24H  . hydrocortisone sod succinate (SOLU-CORTEF) inj  50 mg Intravenous Q12H  . insulin aspart  0-20 Units Subcutaneous Q4H  . potassium chloride  10 mEq Intravenous Q1 Hr x 4  .  sodium chloride  10-40 mL Intracatheter Q12H    Continuous Infusions: . sodium chloride 20 mL/hr at 11/23/12 1900  . sodium chloride 5 mL/hr at 11/23/12 1600  . amiodarone (NEXTERONE PREMIX) 360 mg/200 mL dextrose 30 mg/hr (11/24/12 0008)  . dextrose 20 mL/hr at 11/24/12 0040  . feeding supplement (OSMOLITE 1.2 CAL)    . fentaNYL infusion INTRAVENOUS 50 mcg/hr (11/24/12 0304)  . heparin 1,200 Units/hr (11/24/12 1030)    Loyce Dys, MS RD LDN Clinical Inpatient Dietitian Pager: [phone] Weekend/After hours pager: [phone]

## 2012-11-24 NOTE — Progress Notes (Signed)
ANTICOAGULATION CONSULT NOTE - Follow Up Consult  Pharmacy Consult for heparin Indication: atrial fibrillation  No Known Allergies  Patient Measurements: Height: 5\' 2"  (157.5 cm) Weight: 191 lb 2.2 oz (86.7 kg) IBW/kg (Calculated) : 50.1 Heparin dosing weight= 68kg  Vital Signs: Temp: 98.3 F (36.8 C) (07/18 0830) Temp src: Oral (07/18 0830) BP: 138/98 mmHg (07/18 0840) Pulse Rate: 64 (07/18 0840)  Labs:  Recent Labs  11/22/12 0515 11/22/12 2130 11/23/12 0419 11/23/12 1355 11/23/12 1810 11/24/12 0500  HGB 7.7* 7.0* 6.9*  --  7.2* 6.8*  HCT 24.5* 21.2* 21.3*  --  21.8* 20.3*  PLT 263 253 249  --  209 197  APTT 25  --   --  29  --   --   LABPROT 21.5*  --  21.3* 21.6*  --  20.8*  INR 1.93*  --  1.91* 1.94*  --  1.85*  HEPARINUNFRC  --  0.15*  --   --   --  <0.10*  CREATININE 2.03*  --  1.93*  --   --  1.70*    Estimated Creatinine Clearance: 30.6 ml/min (by C-G formula based on Cr of 1.7).   Assessment: 62 YOF with hx of Afib on chronic warfarin therapy. Heparin bridge for trach (placed earlier today).  Heparin has been held since 7/17 at 0400 for trach placement, now to resume per Dr Sung Amabile.  Coumadin remains on hold.  Of note, patient  had bloody respiratory sections as well as hematuria on 7/17. Noted noted with PRBC transfusion on 7/17 (also 4 units FFP on 7/17) and also planned PRBC for 7/18.   Today heparin level is < 0.1, INR= 1.85, Hg= 6.8  Goal of Therapy:  Heparin level= 0.3-0.5 Monitor platelets by anticoagulation protocol: Yes  Plan:  -No heparin bolus -Increase heparin to 1200 units/hr -Heparin level in 8hrs  Harland German, Pharm D 11/24/2012 9:45 AM

## 2012-11-24 NOTE — Op Note (Signed)
[name], [name] NO.:  [id number]  MEDICAL RECORD NO.:  [id number]  LOCATION:  2902                         FACILITY:  [facility]  PHYSICIAN:  Nelda Bucks, MD DATE OF BIRTH:  12-Mar-1941  DATE OF PROCEDURE: DATE OF DISCHARGE:                              OPERATIVE REPORT   Consent was obtained from the patient's daughter, fully aware of risks and benefits of procedure including bleeding, infection, pneumothorax, and death.  FFP was provided before and during the procedure to improve hemostasis.  PREOPERATIVE DIAGNOSIS:  Continued ventilator-dependent respiratory failure secondary to congestive heart failure, atrial fibrillation, and superimposed pneumonia.  POSTOPERATIVE DIAGNOSIS:  Status post tracheostomy secondary to congestive heart failure exacerbation.  BRONCHOSCOPIST OF THE PROCEDURE:  Leslye Peer, MD  DESCRIPTION OF PROCEDURE:  The patient was placed in a supine position. Chlorhexidine preparation was used to sterilize the operative site.  The patient did require Versed, fentanyl, and etomidate and did require vecuronium.  The bronchoscopist placed the bronchoscope through the endotracheal tube, backed up to approximately 15 cm.  A 1 cm vertical incision was made after lidocaine injection and epinephrine of approximately 6 mL.  Dissection was made down to the strap muscles which were identified and separated and down to the tracheal planes.  There was some small amount of thyroid isthmus tissue noted which was not in the field of the tracheostomy site.  An 18-gauge needle was placed over white catheter sheath into the airway directly to visualize by the bronchoscope without any posterior wall injury.  The needle was removed. The wire was placed through the white catheter sheath and white catheter sheath was removed.  A 14-French punch dilator was placed into the airway.  A progressive rhino dilator was placed to approximately 30- [place]  over a glider and airway.  The glider wire remained.  The size 6 tracheostomy over 26-French dilator was placed over the glider wire successfully into the airway.  Everything was removed except the tracheostomy.  Tracheostomy was sutured in place with 2-0 monofilament sutures.  The bronchoscopist placed the bronchoscope through the new tracheostomy.  The carina was approximately 3-4 cm below without any active bleeding or apparent laceration or injury pattern.  Patient's saturations ranged from 83% to 100% throughout the procedure.  She tolerated procedure quite well.  The blood losswas estimated to be less than 1 mL.  The patient is to follow up with percutaneous tracheostomy clinic by calling [phone].  Portable chest x-ray is pending at this time.     Nelda Bucks, MD     DJF/MEDQ  D:  11/23/2012  T:  11/24/2012  Job:  [id number]

## 2012-11-24 NOTE — Consult Note (Signed)
Reason for Consult:vaginal vault prolapse Referring Physician: Dr. Howie Ill [name] is an 72 y.o. female.  Admitted with CHF. She has vaginal prolapse which is not being managed.  Pertinent Gynecological History: Menses: post-menopausal Bleeding: none Contraception: post menopausal status DES exposure: unknown Blood transfusions: unknown Sexually transmitted diseases: unknown Previous GYN Procedures: recorded BTL and D and C, suspect suprcervical hysterectomy    OB History: unable to obtain   Menstrual History:  No LMP recorded. Patient is postmenopausal.    Past Medical History  Diagnosis Date  . CAD (coronary artery disease)     a. Nonobst by cath 11/11: LAD 40%, mid-dist 25-30%; prox CFX 30%, mid 40%; OM 50%; PDA 50%; PLV 50%;  EF 65%.  . NSTEMI (non-ST elevated myocardial infarction)     a. In setting of AFib with RVR 03/2010, type 2. b. Again in 11/2011 felt 2/2 afib.  . Atrial fibrillation     a. DCCV 11/17/11. b. On coumadin & amiodarone.  Marland Kitchen HOCM (hypertrophic obstructive cardiomyopathy)     a. Echo 11/2011:severe LVH, EF 65-70%, mid-cavity gradient up to . b. Echo 06/2012: EF 65-70%, severe LVH, grade 2 d/dysf.  Marland Kitchen Hypertension   . Tobacco abuse   . CKD (chronic kidney disease), stage IV   . Diastolic CHF     preserved LVF  . Iron deficiency anemia   . Third degree uterine prolapse   . Hx MRSA infection   . Hx of cardiovascular stress test     a. Lex MV 12/13:  EF 56%, no ischemia    . Diabetes   . Hyperlipidemia   . Respiratory failure     Multiple admissions for resp failure requiring intubation.  . Hypercalcemia   . LBBB (left bundle branch block)     History of transient LBBB  . C. difficile colitis 01/2012    Past Surgical History  Procedure Laterality Date  . Tubal ligation    . Cardioversion  11/17/2011    Procedure: CARDIOVERSION;  Surgeon: Hillis Range, MD;  Location: [facility] OR;  Service: Cardiovascular;  Laterality: N/A;    Family History   Problem Relation Age of Onset  . Coronary artery disease Neg Hx   . Atrial fibrillation Neg Hx   . Diabetes Daughter     Social History:  reports that she quit smoking about 9 months ago. Her smoking use included Cigarettes. She has a 7.5 pack-year smoking history. She has never used smokeless tobacco. She reports that she does not drink alcohol or use illicit drugs.  Allergies: No Known Allergies  Medications: I have reviewed the patient's current medications.  ROS Unable to obtain Blood pressure 120/68, pulse 98, temperature 99.8 F (37.7 C), temperature source Oral, resp. rate 23, height 5\' 2"  (1.575 m), weight 191 lb 2.2 oz (86.7 kg), SpO2 98.00%. Physical Exam Tracheostomy, on ventilator, not able to respond to questions  Total vaginal vault prolapse, keratinization and mild-mod irritation,  Cervix palpable but I suspect uterine fundus is surgically absent Results for orders placed during the hospital encounter of 11/09/12 (from the past 48 hour(s))  GLUCOSE, CAPILLARY     Status: Abnormal   Collection Time    11/22/12  9:20 PM      Result Value Range   Glucose-Capillary 109 (*) 70 - 99 mg/dL  VANCOMYCIN, TROUGH     Status: None   Collection Time    11/22/12  9:30 PM      Result Value Range   Vancomycin Tr  18.3  10.0 - 20.0 ug/mL  HEPARIN LEVEL (UNFRACTIONATED)     Status: Abnormal   Collection Time    11/22/12  9:30 PM      Result Value Range   Heparin Unfractionated 0.15 (*) 0.30 - 0.70 IU/mL   Comment:            IF HEPARIN RESULTS ARE BELOW     EXPECTED VALUES, AND PATIENT     DOSAGE HAS BEEN CONFIRMED,     SUGGEST FOLLOW UP TESTING     OF ANTITHROMBIN III LEVELS.  CBC     Status: Abnormal   Collection Time    11/22/12  9:30 PM      Result Value Range   WBC 23.7 (*) 4.0 - 10.5 K/uL   Comment: WHITE COUNT CONFIRMED ON SMEAR   RBC 2.63 (*) 3.87 - 5.11 MIL/uL   Hemoglobin 7.0 (*) 12.0 - 15.0 g/dL   HCT 96.0 (*) 45.4 - 09.8 %   MCV 80.6  78.0 - 100.0 fL    MCH 26.6  26.0 - 34.0 pg   MCHC 33.0  30.0 - 36.0 g/dL   RDW 11.9 (*) 14.7 - 82.9 %   Platelets 253  150 - 400 K/uL   Comment: PLATELET COUNT CONFIRMED BY SMEAR     LARGE PLATELETS PRESENT  GLUCOSE, CAPILLARY     Status: Abnormal   Collection Time    11/23/12 12:17 AM      Result Value Range   Glucose-Capillary 163 (*) 70 - 99 mg/dL  GLUCOSE, CAPILLARY     Status: Abnormal   Collection Time    11/23/12  3:41 AM      Result Value Range   Glucose-Capillary 148 (*) 70 - 99 mg/dL  RENAL FUNCTION PANEL     Status: Abnormal   Collection Time    11/23/12  4:19 AM      Result Value Range   Sodium 142  135 - 145 mEq/L   Potassium 3.4 (*) 3.5 - 5.1 mEq/L   Chloride 114 (*) 96 - 112 mEq/L   CO2 19  19 - 32 mEq/L   Glucose, Bld 194 (*) 70 - 99 mg/dL   BUN 42 (*) 6 - 23 mg/dL   Creatinine, Ser 5.62 (*) 0.50 - 1.10 mg/dL   Calcium 13.0 (*) 8.4 - 10.5 mg/dL   Phosphorus 3.9  2.3 - 4.6 mg/dL   Albumin 2.2 (*) 3.5 - 5.2 g/dL   GFR calc non Af Amer 25 (*) >90 mL/min   GFR calc Af Amer 29 (*) >90 mL/min   Comment:            The eGFR has been calculated     using the CKD EPI equation.     This calculation has not been     validated in all clinical     situations.     eGFR's persistently     <90 mL/min signify     possible Chronic Kidney Disease.  CBC     Status: Abnormal   Collection Time    11/23/12  4:19 AM      Result Value Range   WBC 22.3 (*) 4.0 - 10.5 K/uL   Comment: WHITE COUNT CONFIRMED ON SMEAR   RBC 2.66 (*) 3.87 - 5.11 MIL/uL   Hemoglobin 6.9 (*) 12.0 - 15.0 g/dL   Comment: CRITICAL RESULT CALLED TO, READ BACK BY AND VERIFIED WITH:     HODGIN,G RN 11/23/2012 0455 JORDANS  REPEATED TO VERIFY   HCT 21.3 (*) 36.0 - 46.0 %   MCV 80.1  78.0 - 100.0 fL   MCH 25.9 (*) 26.0 - 34.0 pg   MCHC 32.4  30.0 - 36.0 g/dL   RDW 16.1 (*) 09.6 - 04.5 %   Platelets 249  150 - 400 K/uL   Comment: PLATELET COUNT CONFIRMED BY SMEAR  PROCALCITONIN     Status: None   Collection Time     11/23/12  4:19 AM      Result Value Range   Procalcitonin 2.52     Comment:            Interpretation:     PCT > 2 ng/mL:     Systemic infection (sepsis) is likely,     unless other causes are known.     (NOTE)             ICU PCT Algorithm               Non ICU PCT Algorithm        ----------------------------     ------------------------------             PCT < 0.25 ng/mL                 PCT < 0.1 ng/mL         Stopping of antibiotics            Stopping of antibiotics           strongly encouraged.               strongly encouraged.        ----------------------------     ------------------------------           PCT level decrease by               PCT < 0.25 ng/mL           >= 80% from peak PCT           OR PCT 0.25 - 0.5 ng/mL          Stopping of antibiotics                                                 encouraged.         Stopping of antibiotics               encouraged.        ----------------------------     ------------------------------           PCT level decrease by              PCT >= 0.25 ng/mL           < 80% from peak PCT            AND PCT >= 0.5 ng/mL            Continuing antibiotics                                                  encouraged.           Continuing antibiotics  encouraged.        ----------------------------     ------------------------------         PCT level increase compared          PCT > 0.5 ng/mL             with peak PCT AND              PCT >= 0.5 ng/mL             Escalation of antibiotics                                              strongly encouraged.          Escalation of antibiotics            strongly encouraged.  PROTIME-INR     Status: Abnormal   Collection Time    11/23/12  4:19 AM      Result Value Range   Prothrombin Time 21.3 (*) 11.6 - 15.2 seconds   INR 1.91 (*) 0.00 - 1.49  PREPARE RBC (CROSSMATCH)     Status: None   Collection Time    11/23/12  6:50 AM      Result Value Range   Order  Confirmation ORDER PROCESSED BY BLOOD BANK    TYPE AND SCREEN     Status: None   Collection Time    11/23/12  6:50 AM      Result Value Range   ABO/RH(D) A POS     Antibody Screen NEG     Sample Expiration 11/26/2012     Unit Number [id number]     Blood Component Type RBC LR PHER1     Unit division 00     Status of Unit ISSUED,FINAL     Transfusion Status OK TO TRANSFUSE     Crossmatch Result Compatible     Unit Number [id number]     Blood Component Type RED CELLS,LR     Unit division 00     Status of Unit ISSUED     Transfusion Status OK TO TRANSFUSE     Crossmatch Result Compatible    PREPARE FRESH FROZEN PLASMA     Status: None   Collection Time    11/23/12  7:29 AM      Result Value Range   Unit Number [id number]     Blood Component Type THAWED PLASMA     Unit division 00     Status of Unit ISSUED,FINAL     Transfusion Status OK TO TRANSFUSE     Unit Number [id number]     Blood Component Type THAWED PLASMA     Unit division 00     Status of Unit ISSUED,FINAL     Transfusion Status OK TO TRANSFUSE    GLUCOSE, CAPILLARY     Status: Abnormal   Collection Time    11/23/12  7:48 AM      Result Value Range   Glucose-Capillary 166 (*) 70 - 99 mg/dL  POCT I-STAT 3, BLOOD GAS (G3+)     Status: Abnormal   Collection Time    11/23/12  9:01 AM      Result Value Range   pH, Arterial 7.348 (*) 7.350 - 7.450   pCO2 arterial 33.0 (*) 35.0 - 45.0 mmHg   pO2, Arterial 108.0 (*) 80.0 - 100.0 mmHg  Bicarbonate 18.3 (*) 20.0 - 24.0 mEq/L   TCO2 19  0 - 100 mmol/L   O2 Saturation 98.0     Acid-base deficit 7.0 (*) 0.0 - 2.0 mmol/L   Patient temperature 97.5 F     Collection site RADIAL, ALLEN'S TEST ACCEPTABLE     Drawn by Operator     Sample type ARTERIAL    GLUCOSE, CAPILLARY     Status: Abnormal   Collection Time    11/23/12 11:43 AM      Result Value Range   Glucose-Capillary 143 (*) 70 - 99 mg/dL  PROTIME-INR     Status: Abnormal   Collection Time     11/23/12  1:55 PM      Result Value Range   Prothrombin Time 21.6 (*) 11.6 - 15.2 seconds   INR 1.94 (*) 0.00 - 1.49  APTT     Status: None   Collection Time    11/23/12  1:55 PM      Result Value Range   aPTT 29  24 - 37 seconds  PREPARE FRESH FROZEN PLASMA     Status: None   Collection Time    11/23/12  2:48 PM      Result Value Range   Unit Number [id number]     Blood Component Type THAWED PLASMA     Unit division 00     Status of Unit ISSUED,FINAL     Transfusion Status OK TO TRANSFUSE     Unit Number [id number]     Blood Component Type THAWED PLASMA     Unit division 00     Status of Unit ISSUED,FINAL     Transfusion Status OK TO TRANSFUSE    GLUCOSE, CAPILLARY     Status: Abnormal   Collection Time    11/23/12  5:16 PM      Result Value Range   Glucose-Capillary 186 (*) 70 - 99 mg/dL  CBC     Status: Abnormal   Collection Time    11/23/12  6:10 PM      Result Value Range   WBC 19.3 (*) 4.0 - 10.5 K/uL   RBC 2.76 (*) 3.87 - 5.11 MIL/uL   Hemoglobin 7.2 (*) 12.0 - 15.0 g/dL   HCT 40.3 (*) 47.4 - 25.9 %   MCV 79.0  78.0 - 100.0 fL   MCH 26.1  26.0 - 34.0 pg   MCHC 33.0  30.0 - 36.0 g/dL   RDW 56.3 (*) 87.5 - 64.3 %   Platelets 209  150 - 400 K/uL  GLUCOSE, CAPILLARY     Status: Abnormal   Collection Time    11/23/12  8:50 PM      Result Value Range   Glucose-Capillary 196 (*) 70 - 99 mg/dL  CULTURE, RESPIRATORY (NON-EXPECTORATED)     Status: None   Collection Time    11/23/12  9:45 PM      Result Value Range   Specimen Description TRACHEAL ASPIRATE     Special Requests Normal     Gram Stain       Value: RARE WBC PRESENT, PREDOMINANTLY PMN     MODERATE SQUAMOUS EPITHELIAL CELLS PRESENT     NO ORGANISMS SEEN   Culture PENDING     Report Status PENDING    GLUCOSE, CAPILLARY     Status: Abnormal   Collection Time    11/24/12 12:06 AM      Result Value Range   Glucose-Capillary 199 (*) 70 - 99 mg/dL  GLUCOSE, CAPILLARY     Status: Abnormal    Collection Time    11/24/12  3:59 AM      Result Value Range   Glucose-Capillary 140 (*) 70 - 99 mg/dL  BLOOD GAS, ARTERIAL     Status: Abnormal   Collection Time    11/24/12  4:44 AM      Result Value Range   FIO2 60.00     Delivery systems VENTILATOR     Mode PRESSURE CONTROL     Rate 20.0     Peep/cpap 8.0     Pressure control 22.00     pH, Arterial 7.456 (*) 7.350 - 7.450   pCO2 arterial 29.5 (*) 35.0 - 45.0 mmHg   pO2, Arterial 143.0 (*) 80.0 - 100.0 mmHg   Bicarbonate 20.5  20.0 - 24.0 mEq/L   TCO2 21.4  0 - 100 mmol/L   Acid-base deficit 2.8 (*) 0.0 - 2.0 mmol/L   O2 Saturation 99.9     Patient temperature 98.6     Collection site A-LINE     Drawn by [phone]     Sample type ARTERIAL    HEPARIN LEVEL (UNFRACTIONATED)     Status: Abnormal   Collection Time    11/24/12  5:00 AM      Result Value Range   Heparin Unfractionated <0.10 (*) 0.30 - 0.70 IU/mL   Comment:            IF HEPARIN RESULTS ARE BELOW     EXPECTED VALUES, AND PATIENT     DOSAGE HAS BEEN CONFIRMED,     SUGGEST FOLLOW UP TESTING     OF ANTITHROMBIN III LEVELS.     REPEATED TO VERIFY  CBC     Status: Abnormal   Collection Time    11/24/12  5:00 AM      Result Value Range   WBC 15.1 (*) 4.0 - 10.5 K/uL   RBC 2.59 (*) 3.87 - 5.11 MIL/uL   Hemoglobin 6.8 (*) 12.0 - 15.0 g/dL   Comment: REPEATED TO VERIFY     CRITICAL RESULT CALLED TO, READ BACK BY AND VERIFIED WITH:     C BUNG,RN 0525 11/24/12 D BRADLEY   HCT 20.3 (*) 36.0 - 46.0 %   MCV 78.4  78.0 - 100.0 fL   MCH 26.3  26.0 - 34.0 pg   MCHC 33.5  30.0 - 36.0 g/dL   RDW 60.4 (*) 54.0 - 98.1 %   Platelets 197  150 - 400 K/uL  COMPREHENSIVE METABOLIC PANEL     Status: Abnormal   Collection Time    11/24/12  5:00 AM      Result Value Range   Sodium 142  135 - 145 mEq/L   Potassium 2.8 (*) 3.5 - 5.1 mEq/L   Comment: DELTA CHECK NOTED   Chloride 112  96 - 112 mEq/L   CO2 20  19 - 32 mEq/L   Glucose, Bld 122 (*) 70 - 99 mg/dL   BUN 32 (*) 6 - 23  mg/dL   Creatinine, Ser 1.91 (*) 0.50 - 1.10 mg/dL   Calcium 47.8 (*) 8.4 - 10.5 mg/dL   Total Protein 5.4 (*) 6.0 - 8.3 g/dL   Albumin 2.4 (*) 3.5 - 5.2 g/dL   AST 20  0 - 37 U/L   ALT 28  0 - 35 U/L   Alkaline Phosphatase 94  39 - 117 U/L   Total Bilirubin 0.8  0.3 - 1.2 mg/dL  GFR calc non Af Amer 29 (*) >90 mL/min   GFR calc Af Amer 34 (*) >90 mL/min   Comment:            The eGFR has been calculated     using the CKD EPI equation.     This calculation has not been     validated in all clinical     situations.     eGFR's persistently     <90 mL/min signify     possible Chronic Kidney Disease.  PROTIME-INR     Status: Abnormal   Collection Time    11/24/12  5:00 AM      Result Value Range   Prothrombin Time 20.8 (*) 11.6 - 15.2 seconds   INR 1.85 (*) 0.00 - 1.49  GLUCOSE, CAPILLARY     Status: None   Collection Time    11/24/12  8:46 AM      Result Value Range   Glucose-Capillary 79  70 - 99 mg/dL  GLUCOSE, CAPILLARY     Status: Abnormal   Collection Time    11/24/12 12:46 PM      Result Value Range   Glucose-Capillary 67 (*) 70 - 99 mg/dL  GLUCOSE, CAPILLARY     Status: None   Collection Time    11/24/12  1:11 PM      Result Value Range   Glucose-Capillary 86  70 - 99 mg/dL    Dg Chest Port 1 View  11/24/2012   *RADIOLOGY REPORT*  Clinical Data: Follow up pneumonia.  Intubated patient.  Tube placement.  PORTABLE CHEST - 1 VIEW  Comparison: 11/23/2012.  Findings: The cardiopericardial silhouette is enlarged.  There is diffuse right greater than left bilateral airspace disease.  This has progressed compared yesterday's examination.  The endotracheal tube remains present with the tip 6.3 cm from the carina.  Right upper extremity PICC is present with the tip at the level of the carina in the mid SVC.  Enteric tube is present.  There is probably a small right pleural effusion present.  IMPRESSION:  1.  Stable support apparatus. 2.  Worsening bilateral right greater than  left airspace disease. This may represent multi focal pneumonia and / or edema associated with CHF in this patient with cardiomegaly.   Original Report Authenticated By: Andreas Newport, M.D.   Chest Portable 1 View To Assess Tube Placement And Rule-out Pneumothorax  11/23/2012   *RADIOLOGY REPORT*  Clinical Data: Evaluate tube placement  PORTABLE CHEST - 1 VIEW  Comparison: 11/23/2012  Findings: The tracheostomy tube is midline and the tip is above the carina.  There is a nasogastric tube with tip in the stomach.  Right arm PICC line tip is in the cavoatrial junction.  The right IJ catheter tip is in the projection of the SVC.  Heart size is moderately enlarged.  There are bilateral pleural effusions and interstitial edema consistent with CHF.  IMPRESSION:  1. Satisfactory position of the support apparatus including the tracheostomy tube 2.  Moderate CHF.   Original Report Authenticated By: Signa Kell, M.D.   Dg Chest Port 1 View  11/23/2012   *RADIOLOGY REPORT*  Clinical Data: Pulmonary infiltrates, follow-up  PORTABLE CHEST - 1 VIEW  Comparison: CT chest of 11/22/2012 and chest x-ray of the same date  Findings: The tip of the endotracheal tube is approximately 5.8 cm above the carina.  The lungs appear slightly better aerated although diffuse airspace disease remains.  Haziness at the lung bases is  most consistent with the presence of bilateral pleural effusions, and cardiomegaly is stable.  The right central venous line is unchanged in position.  IMPRESSION: Slightly improved aeration.  Persistent airspace disease with probable effusions.   Original Report Authenticated By: Dwyane Dee, M.D.   Dg Abd Portable 1v  11/24/2012   *RADIOLOGY REPORT*  Clinical Data: Feeding tube placement.  PORTABLE ABDOMEN - 1 VIEW  Comparison: 11/21/2012  Findings: Nasogastric tube is in the distal stomach region. Nonspecific bowel gas pattern.  Limited evaluation of the lung bases.  IMPRESSION: Nasogastric tube is in the  distal stomach region.   Original Report Authenticated By: Richarda Overlie, M.D.    Assessment/Plan: Very sick cardiac patient with known vaginal prolapse, has not sought care prior to this. Not a surgery candidate or a good risk for pessary or any hormone therapy. Continue hygiene as is being done. F/U at [facility] clinic if she is released and able to comply. Thank you.  Tysha Grismore 11/24/2012 6:25 PM

## 2012-11-24 NOTE — Progress Notes (Signed)
PULMONARY  / CRITICAL CARE MEDICINE  Name: [name] MRN: [id number] DOB: 09/07/1940    ADMISSION DATE:  11/09/2012 CONSULTATION DATE:  11/09/2012  REFERRING MD :  Preston Fleeting PRIMARY SERVICE: PCCM  CHIEF COMPLAINT:  Shortness of breath  BRIEF PATIENT DESCRIPTION: 72 y/o female with CHF was admitted on 7/3 from the [facility] ED with acute hypoxemic respiratory failure due to a CHF exacerbation, ? Superimposed PNA  LINES / TUBES: 7/3 ETT >>11/13/12, 11/13/12  (failed extubation immediately, ? aspirated) >> 7/17 7/4 CVC R IJ >> 7/17 7/5 A-line >> 7/18 7/17 Trach (DF) >> 7/17 Rt PICC >>   CULTURES: 7/4 U strep >> neg 7/4 legionella >> neg 7/3 mrsa PCR >> POSITIVE 7/8 mini-BAL >> yeast 7/16 sputum >>  7/16 blood >> 7/16 urine >> yeast 50 K colonies  ANTIBIOTICS: 7/3 Ceftriaxone >>7/6 7/4 levofloxacin >>7/7 7/4 Vancomycin >>7/6, restarted 7/8 >> 7/18 7/7 Unasyn >> 7/16 7/16 cefepime >>   SIGNIFICANT EVENTS: 7/3 admission 7/07 reintubated immediately after extubation.  ? Aspiration. ABX restarted.  on Neo-Synephrine.  7/09 Able to wean fio2 and neo. Lopressor required for rate control of afib. 7/10 Off neo, but now tachycardic.  Failed SBT this AM, but RT felt it was due to sedation.  Sedation weaned 7/14 Hypotensive, vomiting, amiodarone turned off for low heart rate 7/16 Change to pressure control 7/17 Start pressors, PRBC transfusion, trach 7/18 Off pressors, PRBC transfusion  STUDIES: 7/16 CT chest >> emphysema, b/l ASD, small effusions 7/16 CT abd/pelvis >> 2.6 x 2 cm nodule in head of pancreas, bone changes suggestive of Paget's disease  SUBJECTIVE: Off pressors.  Decreased secretions.  VITAL SIGNS: Temp:  [94.4 F (34.7 C)-98.5 F (36.9 C)] 98.3 F (36.8 C) (07/18 0700) Pulse Rate:  [51-96] 71 (07/18 0757) Resp:  [13-33] 20 (07/18 0757) BP: (99-173)/(67-106) 99/89 mmHg (07/18 0757) SpO2:  [88 %-100 %] 99 % (07/18 0757) Arterial Line BP: (71-222)/(62-133) 139/86 mmHg  (07/18 0715) FiO2 (%):  [50 %-60 %] 50 % (07/18 0757) Weight:  [191 lb 2.2 oz (86.7 kg)] 191 lb 2.2 oz (86.7 kg) (07/18 0500)  HEMODYNAMICS: CVP:  [15 mmHg-18 mmHg] 18 mmHg  VENTILATOR SETTINGS: Vent Mode:  [-] PCV FiO2 (%):  [50 %-60 %] 50 % Set Rate:  [16 bmp-20 bmp] 20 bmp PEEP:  [8 cmH20] 8 cmH20 Plateau Pressure:  [16 cmH20-23 cmH20] 20 cmH20  INTAKE / OUTPUT: Intake/Output     07/17 0701 - 07/18 0700 07/18 0701 - 07/19 0700   P.O.     I.V. (mL/kg) 2024 (23.3)    Blood 1592    NG/GT     IV Piggyback 250    Total Intake(mL/kg) 3866 (44.6)    Urine (mL/kg/hr) 2210 (1.1)    Stool     Total Output 2210     Net +1656          Stool Occurrence 1 x     PHYSICAL EXAMINATION: Gen: No distress HEENT: NG tube in place, trach site clean PULM: scattered rhonchi CV: irregular AB: soft, non tender GU: uterine prolapse Ext: no edema Derm: no rash Neuro: RASS 0, moves extremities, follows simple commands  LABS: CBC Recent Labs     11/23/12  0419  11/23/12  1810  11/24/12  0500  WBC  22.3*  19.3*  15.1*  HGB  6.9*  7.2*  6.8*  HCT  21.3*  21.8*  20.3*  PLT  249  209  197    Coag's Recent  Labs     11/22/12  0515  11/23/12  0419  11/23/12  1355  11/24/12  0500  APTT  25   --   29   --   INR  1.93*  1.91*  1.94*  1.85*    BMET Recent Labs     11/22/12  0515  11/23/12  0419  11/24/12  0500  NA  148*  142  142  K  3.8  3.4*  2.8*  CL  119*  114*  112  CO2  19  19  20   BUN  48*  42*  32*  CREATININE  2.03*  1.93*  1.70*  GLUCOSE  157*  194*  122*    Electrolytes Recent Labs     11/22/12  0515  11/23/12  0419  11/24/12  0500  CALCIUM  11.8*  11.3*  11.6*  PHOS   --   3.9   --    Glucose Recent Labs     11/23/12  0748  11/23/12  1143  11/23/12  1716  11/23/12  2050  11/24/12  0006  11/24/12  0359  GLUCAP  166*  143*  186*  196*  199*  140*    Imaging Ct Abdomen Pelvis Wo Contrast  11/22/2012   *RADIOLOGY REPORT*  Clinical Data:   Pulmonary infiltrates with abdominal pain.  CT CHEST, ABDOMEN AND PELVIS WITHOUT CONTRAST  Technique:  Multidetector CT imaging of the chest, abdomen and pelvis was performed following the standard protocol without IV contrast.  Comparison:   None.  CT CHEST  Findings:  Tracheostomy tube tip is in the trachea above the level of the bifurcation.  NG tube tip is in the distal stomach.  No axillary lymphadenopathy.  Scattered small lymph nodes are seen in the mediastinum.  Hilar lymphadenopathy cannot be excluded on this study without intravenous contrast material.  The heart is enlarged. Coronary artery calcification is noted.  No pericardial effusion.  Small bilateral pleural effusions are evident.  Lung windows show advanced underlying emphysema.  There is a peripheral interstitial and airspace disease with bilateral dependent collapse / consolidation.  The dense airspace consolidation mainly involves the lower lobes.  Bone windows reveal no worrisome lytic or sclerotic osseous lesions.  IMPRESSION: Emphysema with peripheral interstitial and airspace disease associated with bilateral lower lobe collapse / consolidation. Underlying pulmonary edema not excluded.  Small bilateral pleural effusions.  CT ABDOMEN AND PELVIS  Findings:  No focal abnormalities seen in the liver or spleen. Duodenum and adrenal glands are unremarkable.  The gallbladder lumen is filled with high attenuation material which may be from vicarious excretion of previous contrast material.  Right kidney is unremarkable.  There is mild fullness of the left intrarenal collecting system.  2.6 x 2.0 cm apparent soft tissue nodule is seen along the inferior head of pancreas.  This is difficult to assess given motion artifact and lack of intravenous contrast material.  No abdominal aortic aneurysm.  No free fluid in the abdomen.  No evidence for small bowel obstruction.  Imaging through the pelvis shows no free intraperitoneal fluid. Foley catheter  decompresses the urinary bladder.  Uterus is surgically absent.  There is no adnexal mass.  There is some diverticular change in the left colon without diverticulitis. Terminal ileum is normal.  The appendix is normal.  Abnormal soft tissue attenuation is seen at the umbilicus.  There is some mild body wall edema in the region of the pelvis.  Bone windows show  changes in the left pelvis presumably related to Paget's disease.  No worrisome lytic or sclerotic osseous abnormality.  IMPRESSION: 2.6 cm soft tissue nodule along the inferior head of pancreas. This is not completely characterized given motion artifact and lack of intravenous contrast material. Pancreatic neoplasm not excluded.  Soft tissue attenuation of the umbilicus.  This could be related to infection or scar.  This should be amendable to clinical inspection.  Mild fullness of the left intrarenal collecting system is of indeterminate etiology/significance.  Underlying component of UPJ obstruction would be a consideration.  Mild body wall edema.   Original Report Authenticated By: Kennith Center, M.D.   Ct Chest Wo Contrast  11/22/2012   *RADIOLOGY REPORT*  Clinical Data:  Pulmonary infiltrates with abdominal pain.  CT CHEST, ABDOMEN AND PELVIS WITHOUT CONTRAST  Technique:  Multidetector CT imaging of the chest, abdomen and pelvis was performed following the standard protocol without IV contrast.  Comparison:   None.  CT CHEST  Findings:  Tracheostomy tube tip is in the trachea above the level of the bifurcation.  NG tube tip is in the distal stomach.  No axillary lymphadenopathy.  Scattered small lymph nodes are seen in the mediastinum.  Hilar lymphadenopathy cannot be excluded on this study without intravenous contrast material.  The heart is enlarged. Coronary artery calcification is noted.  No pericardial effusion.  Small bilateral pleural effusions are evident.  Lung windows show advanced underlying emphysema.  There is a peripheral interstitial and  airspace disease with bilateral dependent collapse / consolidation.  The dense airspace consolidation mainly involves the lower lobes.  Bone windows reveal no worrisome lytic or sclerotic osseous lesions.  IMPRESSION: Emphysema with peripheral interstitial and airspace disease associated with bilateral lower lobe collapse / consolidation. Underlying pulmonary edema not excluded.  Small bilateral pleural effusions.  CT ABDOMEN AND PELVIS  Findings:  No focal abnormalities seen in the liver or spleen. Duodenum and adrenal glands are unremarkable.  The gallbladder lumen is filled with high attenuation material which may be from vicarious excretion of previous contrast material.  Right kidney is unremarkable.  There is mild fullness of the left intrarenal collecting system.  2.6 x 2.0 cm apparent soft tissue nodule is seen along the inferior head of pancreas.  This is difficult to assess given motion artifact and lack of intravenous contrast material.  No abdominal aortic aneurysm.  No free fluid in the abdomen.  No evidence for small bowel obstruction.  Imaging through the pelvis shows no free intraperitoneal fluid. Foley catheter decompresses the urinary bladder.  Uterus is surgically absent.  There is no adnexal mass.  There is some diverticular change in the left colon without diverticulitis. Terminal ileum is normal.  The appendix is normal.  Abnormal soft tissue attenuation is seen at the umbilicus.  There is some mild body wall edema in the region of the pelvis.  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  No worrisome lytic or sclerotic osseous abnormality.  IMPRESSION: 2.6 cm soft tissue nodule along the inferior head of pancreas. This is not completely characterized given motion artifact and lack of intravenous contrast material. Pancreatic neoplasm not excluded.  Soft tissue attenuation of the umbilicus.  This could be related to infection or scar.  This should be  amendable to clinical inspection.  Mild fullness of the left intrarenal collecting system is of indeterminate etiology/significance.  Underlying component of UPJ obstruction would be a consideration.  Mild body wall edema.  Original Report Authenticated By: Kennith Center, M.D.   Dg Chest Port 1 View  11/24/2012   *RADIOLOGY REPORT*  Clinical Data: Follow up pneumonia.  Intubated patient.  Tube placement.  PORTABLE CHEST - 1 VIEW  Comparison: 11/23/2012.  Findings: The cardiopericardial silhouette is enlarged.  There is diffuse right greater than left bilateral airspace disease.  This has progressed compared yesterday's examination.  The endotracheal tube remains present with the tip 6.3 cm from the carina.  Right upper extremity PICC is present with the tip at the level of the carina in the mid SVC.  Enteric tube is present.  There is probably a small right pleural effusion present.  IMPRESSION:  1.  Stable support apparatus. 2.  Worsening bilateral right greater than left airspace disease. This may represent multi focal pneumonia and / or edema associated with CHF in this patient with cardiomegaly.   Original Report Authenticated By: Andreas Newport, M.D.   Chest Portable 1 View To Assess Tube Placement And Rule-out Pneumothorax  11/23/2012   *RADIOLOGY REPORT*  Clinical Data: Evaluate tube placement  PORTABLE CHEST - 1 VIEW  Comparison: 11/23/2012  Findings: The tracheostomy tube is midline and the tip is above the carina.  There is a nasogastric tube with tip in the stomach.  Right arm PICC line tip is in the cavoatrial junction.  The right IJ catheter tip is in the projection of the SVC.  Heart size is moderately enlarged.  There are bilateral pleural effusions and interstitial edema consistent with CHF.  IMPRESSION:  1. Satisfactory position of the support apparatus including the tracheostomy tube 2.  Moderate CHF.   Original Report Authenticated By: Signa Kell, M.D.   Dg Chest Port 1  View  11/23/2012   *RADIOLOGY REPORT*  Clinical Data: Pulmonary infiltrates, follow-up  PORTABLE CHEST - 1 VIEW  Comparison: CT chest of 11/22/2012 and chest x-ray of the same date  Findings: The tip of the endotracheal tube is approximately 5.8 cm above the carina.  The lungs appear slightly better aerated although diffuse airspace disease remains.  Haziness at the lung bases is most consistent with the presence of bilateral pleural effusions, and cardiomegaly is stable.  The right central venous line is unchanged in position.  IMPRESSION: Slightly improved aeration.  Persistent airspace disease with probable effusions.   Original Report Authenticated By: Dwyane Dee, M.D.   Dg Abd Portable 1v  11/24/2012   *RADIOLOGY REPORT*  Clinical Data: Feeding tube placement.  PORTABLE ABDOMEN - 1 VIEW  Comparison: 11/21/2012  Findings: Nasogastric tube is in the distal stomach region. Nonspecific bowel gas pattern.  Limited evaluation of the lung bases.  IMPRESSION: Nasogastric tube is in the distal stomach region.   Original Report Authenticated By: Richarda Overlie, M.D.     ASSESSMENT / PLAN:  PULMONARY A: Acute respiratory failure 2nd to pulmonary infiltrates >> ? Cause. Failure to wean from ventilator >> s/p trach 7/17. 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 A fib with RVR >> amiodarone d/c due to bradycardia, hypotension. Hx of CAD, HTN, hyperlipidemia. Shock >> resolved. P:  -hold lipitor for now -negative fluid balance -solu cortef added 7/15 for ?relative adrenal insufficiency >> start to wean off 7/18  RENAL A: Acute kidney injury >> renal fx stable. Hypernatremia >> improved Hypokalemia. Hypercalcemia >> PTH 678.6 from 7/16. P:   -KVO IV fluids -monitor renal  fx, urine outpt -f/u electrolytes -lasix 20 mg IV x one 7/18 -measure urine calcium  excretion  GASTROINTESTINAL A:  Protein malnutrition. Vomiting 7/14 >> improved. Mass head of pancreas. P:   -resume trickle tube feeds 7/18 and advance as tolerated -Pepcid for SUP -will need further assessment of pancreatic lesion when more stable  GYN A: Uterine prolapse. P: -check with family about who pt has seen for gyn and then consult  HEMATOLOGIC A: Anemia of chronic disease and critical illness >> PRBC transfusion 7/17, 7/18. Chronic coumadin therapy for a fib. P:  -f/u CBC -transfuse for Hb < 7 -continue heparin gtt until more stable  INFECTIOUS A: Initial concern for aspiration pneumonia >> has persistent pulmonary infiltrates, low grade temperature, and increasing WBC. P:   -D15 of Abx >> continue cefepime pending repeat blood, sputum, urine cx -d/c vancomycin 7/18  ENDOCRINE A:  DM2 P:   -SSI -d/c lantus until sure she can tolerate enteral feedings  NEUROLOGIC A: Acute encephalopathy 2nd to hypoxia/hypercapnia. Muscular deconditioning. P:   -limit sedation while on vent -will need PT/OT when more stable   CC time 35 minutes.  Coralyn Helling, MD [facility] Pulmonary/Critical Care 11/24/2012, 8:38 AM Pager:  [phone] After 3pm call: [phone]

## 2012-11-24 NOTE — Progress Notes (Signed)
eLink Physician-Brief Progress Note Patient Name: [name] DOB: [birth year] MRN: [id number]  Date of Service  11/24/2012   HPI/Events of Note    Recent Labs Lab 11/22/12 0515 11/22/12 2130 11/23/12 0419 11/23/12 1810 11/24/12 0500  HGB 7.7* 7.0* 6.9* 7.2* 6.8*     eICU Interventions  Anemia of critical illness. No overt bleeding Hgb now < 7gm%  Plan 1 unit prbc   Intervention Category Major Interventions: Arrhythmia - evaluation and management Intermediate Interventions: Diagnostic test evaluation Minor Interventions: Other:;Routine modifications to care plan (e.g. PRN medications for pain, fever)  Alechia Lezama 11/24/2012, 5:47 AM

## 2012-11-24 NOTE — Progress Notes (Signed)
ANTICOAGULATION CONSULT NOTE - Follow Up Consult  Pharmacy Consult for heparin Indication: atrial fibrillation  No Known Allergies  Patient Measurements: Height: 5\' 2"  (157.5 cm) Weight: 191 lb 2.2 oz (86.7 kg) IBW/kg (Calculated) : 50.1 Heparin dosing weight= 68kg  Vital Signs: Temp: 99.8 F (37.7 C) (07/18 1600) Temp src: Oral (07/18 1600) BP: 136/75 mmHg (07/18 1927) Pulse Rate: 102 (07/18 1927)  Labs:  Recent Labs  11/22/12 0515 11/22/12 2130 11/23/12 0419 11/23/12 1355 11/23/12 1810 11/24/12 0500 11/24/12 1846  HGB 7.7* 7.0* 6.9*  --  7.2* 6.8*  --   HCT 24.5* 21.2* 21.3*  --  21.8* 20.3*  --   PLT 263 253 249  --  209 197  --   APTT 25  --   --  29  --   --   --   LABPROT 21.5*  --  21.3* 21.6*  --  20.8*  --   INR 1.93*  --  1.91* 1.94*  --  1.85*  --   HEPARINUNFRC  --  0.15*  --   --   --  <0.10* 0.72*  CREATININE 2.03*  --  1.93*  --   --  1.70*  --     Estimated Creatinine Clearance: 30.6 ml/min (by C-G formula based on Cr of 1.7).   Assessment: 76 YOF with hx of Afib on chronic warfarin therapy.  On heparin bridge while coumadin held.  Of note, patient  had bloody respiratory sections as well as hematuria on 7/17. Noted noted with PRBC transfusion on 7/17 (also 4 units FFP on 7/17) and also planned PRBC for 7/18.   Heparin level this evening is reported as 0.72; jsupratherapeutic after heparin rate was increased earlier today due to subtherapeutic level.  Spoke with RN who indicated that patient was having some bloody secretions from trach.  No other complications noted.  Goal of Therapy:  Heparin level= 0.3-0.7 (target lower end of range due to anemia and bleeding) Monitor platelets by anticoagulation protocol: Yes  Plan:  - Derease heparin to 1050 units/hr -Check 8h heparin level  Edvin Albus L. Illene Bolus, PharmD, BCPS Clinical Pharmacist Pager: [phone] Pharmacy: [phone] 11/24/2012 7:47 PM

## 2012-11-25 ENCOUNTER — Inpatient Hospital Stay (HOSPITAL_COMMUNITY): Payer: Medicare Other

## 2012-11-25 DIAGNOSIS — Z930 Tracheostomy status: Secondary | ICD-10-CM

## 2012-11-25 LAB — BASIC METABOLIC PANEL
BUN: 32 mg/dL — ABNORMAL HIGH (ref 6–23)
CO2: 23 mEq/L (ref 19–32)
Calcium: 11.6 mg/dL — ABNORMAL HIGH (ref 8.4–10.5)
GFR calc Af Amer: 28 mL/min — ABNORMAL LOW (ref >90)
GFR calc non Af Amer: 28 mL/min — ABNORMAL LOW (ref >90)
GFR calc non Af Amer: 24 mL/min — ABNORMAL LOW (ref >90)
Glucose, Bld: 170 mg/dL — ABNORMAL HIGH (ref 70–99)
Potassium: 2.5 mEq/L — CL (ref 3.5–5.1)
Sodium: 143 mEq/L (ref 135–145)

## 2012-11-25 LAB — PROTIME-INR: Prothrombin Time: 22.5 seconds — ABNORMAL HIGH (ref 11.6–15.2)

## 2012-11-25 LAB — TYPE AND SCREEN
ABO/RH(D): A POS
Unit division: 0

## 2012-11-25 LAB — CBC
HCT: 25.9 % — ABNORMAL LOW (ref 36.0–46.0)
Hemoglobin: 8.8 g/dL — ABNORMAL LOW (ref 12.0–15.0)
MCH: 27 pg (ref 26.0–34.0)
MCHC: 34 g/dL (ref 30.0–36.0)

## 2012-11-25 LAB — CALCIUM, URINE, 24 HOUR
Calcium, 24 hour urine: 242 mg/d (ref 100–250)
Calcium, Ur: 11 mg/dL

## 2012-11-25 LAB — GLUCOSE, CAPILLARY
Glucose-Capillary: 129 mg/dL — ABNORMAL HIGH (ref 70–99)
Glucose-Capillary: 179 mg/dL — ABNORMAL HIGH (ref 70–99)

## 2012-11-25 MED ORDER — DIGOXIN 0.25 MG/ML IJ SOLN
0.1250 mg | Freq: Every day | INTRAMUSCULAR | Status: DC
  Administered 2012-11-25 – 2012-12-03 (×9): 0.125 mg via INTRAVENOUS
  Filled 2012-11-25 (×10): qty 0.5

## 2012-11-25 MED ORDER — FUROSEMIDE 10 MG/ML IJ SOLN
40.0000 mg | Freq: Two times a day (BID) | INTRAMUSCULAR | Status: DC
  Administered 2012-11-25 (×2): 40 mg via INTRAVENOUS
  Filled 2012-11-25 (×5): qty 4

## 2012-11-25 MED ORDER — FENTANYL 50 MCG/HR TD PT72
100.0000 ug | MEDICATED_PATCH | TRANSDERMAL | Status: DC
  Filled 2012-11-25: qty 2

## 2012-11-25 MED ORDER — POTASSIUM CHLORIDE 10 MEQ/50ML IV SOLN
10.0000 meq | INTRAVENOUS | Status: AC
  Administered 2012-11-25 (×6): 10 meq via INTRAVENOUS
  Filled 2012-11-25 (×2): qty 150

## 2012-11-25 MED ORDER — CLONAZEPAM 1 MG PO TABS
1.0000 mg | ORAL_TABLET | Freq: Two times a day (BID) | ORAL | Status: DC
  Administered 2012-11-25 – 2012-11-29 (×9): 1 mg via ORAL
  Filled 2012-11-25 (×9): qty 1

## 2012-11-25 MED ORDER — POTASSIUM CHLORIDE 20 MEQ/15ML (10%) PO LIQD
40.0000 meq | Freq: Two times a day (BID) | ORAL | Status: DC
  Administered 2012-11-25 – 2012-12-01 (×12): 40 meq
  Filled 2012-11-25 (×15): qty 30

## 2012-11-25 MED ORDER — FENTANYL 50 MCG/HR TD PT72
100.0000 ug | MEDICATED_PATCH | TRANSDERMAL | Status: DC
  Administered 2012-11-25 – 2012-11-28 (×2): 100 ug via TRANSDERMAL
  Filled 2012-11-25 (×2): qty 1

## 2012-11-25 NOTE — Progress Notes (Signed)
Dr. Molli Knock at bedside, notified of pt agitated with increased heart rate and desaturation.   Will continue to monitor pt closely.

## 2012-11-25 NOTE — Progress Notes (Signed)
230cc of fentanyl drip ( per mL) wasted in sink followed by water flush.

## 2012-11-25 NOTE — Progress Notes (Signed)
Pt much calmer, respiratory rate went from 40-48 to 22-24 range since placed back on 8 of peep by Respiratory Therapist.  Will continue to monitor pt closely.

## 2012-11-25 NOTE — Progress Notes (Signed)
CONSULT NOTE - Follow Up Consult  Pharmacy Consult for heparin, cefepime Indication: atrial fibrillation. PNA  No Known Allergies  Patient Measurements: Height: 5\' 2"  (157.5 cm) Weight: 189 lb 6 oz (85.9 kg) IBW/kg (Calculated) : 50.1 Heparin dosing weight= 68kg  Vital Signs: Temp: 98.6 F (37 C) (07/19 0750) Temp src: Oral (07/19 0750) BP: 95/75 mmHg (07/19 0750) Pulse Rate: 76 (07/19 0750)  Labs:  Recent Labs  11/23/12 0419 11/23/12 1355  11/24/12 0500 11/24/12 1846 11/24/12 2015 11/25/12 0400  HGB 6.9*  --   < > 6.8*  --  8.6* 8.8*  HCT 21.3*  --   < > 20.3*  --  24.9* 25.9*  PLT 249  --   < > 197  --  195 176  APTT  --  29  --   --   --   --   --   LABPROT 21.3* 21.6*  --  20.8*  --   --  22.5*  INR 1.91* 1.94*  --  1.85*  --   --  2.05*  HEPARINUNFRC  --   --   --  <0.10* 0.72*  --  0.37  CREATININE 1.93*  --   --  1.70*  --   --  1.76*  < > = values in this interval not displayed.  Estimated Creatinine Clearance: 29.4 ml/min (by C-G formula based on Cr of 1.76).   Assessment: 26 YOF with hx of Afib on chronic warfarin therapy.  On heparin bridge while coumadin held.  Of note, patient  had bloody respiratory sections as well as hematuria on 7/17. Noted noted with PRBC transfusion on 7/17 (also 4 units FFP on 7/17) and also PRBC noted on 7/18.  Heparin level is 0.37 and INR noted as 2.05 (has been 1.85-2.59 since 7/14 and last dose of coumadin was 7/14).   Patient also on cefepime for concern of aspiration PNA. WBC= 20.4 (noted on IV steroids), tmax= 99.8 and CXR 7/19 shows- Diffuse bilateral airspace opacification again noted, worse on the right. SCr= 1.76 and CrCl ~ 30,  Vanc 7/4 >>7/6, 7/8>> 7/18 Unasyn 7/7 (aspiration)>> 7/16 7/16 cefepime>>  7/4 BCx -neg 7/3 UCx -neg 7/8 BAL- candida 7/16 blood x2- ngtd 7/16 urine- yeast 7/16 resp- ngtd 7/17 resp- ngtd  Goal of Therapy:  Heparin level= 0.3-0.7 (target lower end of range due to anemia and  bleeding) Monitor platelets by anticoagulation protocol: Yes  Plan:  -No heparin changes needed -Could consider discontinuing heparin and following INR trend -No cefepime changes -Will follow antibiotic plans  Harland German, Pharm D 11/25/2012 8:41 AM

## 2012-11-25 NOTE — Progress Notes (Signed)
Dr. Molli Knock notified of continued agitation, desaturation since vent changes.  Will continue to monitor pt closely.

## 2012-11-25 NOTE — Progress Notes (Signed)
Respiratory therapy called and at bedside to assist with ventilator. Patient agitated, tachypneic, with nasal flaring noted, triggering vent alarms.  Will continue to monitor pt closely.

## 2012-11-25 NOTE — Progress Notes (Signed)
eLink Physician-Brief Progress Note Patient Name: [name] DOB: Aug 19, 1940 MRN: [id number]  Date of Service  11/25/2012   HPI/Events of Note   Hypokalemia  eICU Interventions  Potassium replaced   Intervention Category Intermediate Interventions: Electrolyte abnormality - evaluation and management  DETERDING,ELIZABETH 11/25/2012, 4:51 AM

## 2012-11-25 NOTE — Progress Notes (Signed)
PULMONARY  / CRITICAL CARE MEDICINE  Name: ZALEA PETE MRN: [id number] DOB: Jan 08, 1941    ADMISSION DATE:  11/09/2012 CONSULTATION DATE:  11/09/2012  REFERRING MD :  Preston Fleeting PRIMARY SERVICE: PCCM  CHIEF COMPLAINT:  Shortness of breath.  BRIEF PATIENT DESCRIPTION: 72 y/o female with CHF was admitted on 7/3 from the [facility] ED with acute hypoxemic respiratory failure due to a CHF exacerbation, ?Superimposed PNA.  LINES / TUBES: 7/3 ETT >>11/13/12, 11/13/12  (failed extubation immediately, ? aspirated) >> 7/17 7/4 CVC R IJ >> 7/17 7/5 A-line >> 7/18 7/17 Trach (DF) >> 7/17 Rt PICC >>   CULTURES: 7/4 U strep >> neg 7/4 legionella >> neg 7/3 mrsa PCR >> POSITIVE 7/8 mini-BAL >> yeast 7/16 sputum >>  7/16 blood >> 7/16 urine >> yeast 50 K colonies  ANTIBIOTICS: 7/3 Ceftriaxone >>7/6 7/4 levofloxacin >>7/7 7/4 Vancomycin >>7/6, restarted 7/8 >> 7/18 7/7 Unasyn >> 7/16 7/16 cefepime >>   SIGNIFICANT EVENTS: 7/3 admission 7/07 reintubated immediately after extubation.  ? Aspiration. ABX restarted.  on Neo-Synephrine.  7/09 Able to wean fio2 and neo. Lopressor required for rate control of afib. 7/10 Off neo, but now tachycardic.  Failed SBT this AM, but RT felt it was due to sedation.  Sedation weaned 7/14 Hypotensive, vomiting, amiodarone turned off for low heart rate 7/16 Change to pressure control 7/17 Start pressors, PRBC transfusion, trach 7/18 Off pressors, PRBC transfusion  STUDIES: 7/16 CT chest >> emphysema, b/l ASD, small effusions 7/16 CT abd/pelvis >> 2.6 x 2 cm nodule in head of pancreas, bone changes suggestive of Paget's disease  SUBJECTIVE: Off pressors.  Decreased secretions.  VITAL SIGNS: Temp:  [97.9 F (36.6 C)-99.8 F (37.7 C)] 98.6 F (37 C) (07/19 0750) Pulse Rate:  [25-102] 76 (07/19 0750) Resp:  [16-30] 19 (07/19 0750) BP: (92-155)/(55-112) 95/75 mmHg (07/19 0750) SpO2:  [93 %-100 %] 100 % (07/19 0750) Arterial Line BP: (134)/(118) 134/118 mmHg  (07/18 0900) FiO2 (%):  [40 %-50 %] 40 % (07/19 0750) Weight:  [85.9 kg (189 lb 6 oz)] 85.9 kg (189 lb 6 oz) (07/19 0254)  HEMODYNAMICS: CVP:  [10 mmHg-15 mmHg] 10 mmHg  VENTILATOR SETTINGS: Vent Mode:  [-] PCV FiO2 (%):  [40 %-50 %] 40 % Set Rate:  [16 bmp-20 bmp] 16 bmp PEEP:  [8 cmH20] 8 cmH20 Plateau Pressure:  [19 cmH20-27 cmH20] 27 cmH20  INTAKE / OUTPUT: Intake/Output     07/18 0701 - 07/19 0700 07/19 0701 - 07/20 0700   I.V. (mL/kg) 1257.6 (14.6)    Blood 320    NG/GT 283.3    IV Piggyback 450    Total Intake(mL/kg) 2310.9 (26.9)    Urine (mL/kg/hr) 2605 (1.3)    Total Output 2605     Net -294.1          Stool Occurrence 1 x     PHYSICAL EXAMINATION: Gen: No distress HEENT: NG tube in place, trach site clean PULM: scattered rhonchi CV: irregular AB: soft, non tender GU: uterine prolapse Ext: no edema Derm: no rash Neuro: RASS 0, moves extremities, follows simple commands  LABS: CBC Recent Labs     11/24/12  0500  11/24/12  2015  11/25/12  0400  WBC  15.1*  20.2*  20.4*  HGB  6.8*  8.6*  8.8*  HCT  20.3*  24.9*  25.9*  PLT  197  195  176   Coag's Recent Labs     11/23/12  1355  11/24/12  0500  11/25/12  0400  APTT  29   --    --   INR  1.94*  1.85*  2.05*   BMET Recent Labs     11/23/12  0419  11/24/12  0500  11/25/12  0400  NA  142  142  143  K  3.4*  2.8*  2.5*  CL  114*  112  113*  CO2  19  20  22   BUN  42*  32*  32*  CREATININE  1.93*  1.70*  1.76*  GLUCOSE  194*  122*  170*   Electrolytes Recent Labs     11/23/12  0419  11/24/12  0500  11/25/12  0400  CALCIUM  11.3*  11.6*  11.4*  PHOS  3.9   --    --    Glucose Recent Labs     11/24/12  1246  11/24/12  1311  11/24/12  1647  11/24/12  2012  11/24/12  2347  11/25/12  0402  GLUCAP  67*  86  95  129*  148*  171*    Imaging Dg Chest Port 1 View  11/25/2012   *RADIOLOGY REPORT*  Clinical Data: Follow up pneumonia.  PORTABLE CHEST - 1 VIEW  Comparison: Chest  radiograph performed 11/24/2012  Findings: The lungs are relatively well expanded.  Diffuse bilateral airspace opacification is again noted, worse on the right.  This may reflect multifocal pneumonia or pulmonary edema. Small bilateral pleural effusions are noted.  No pneumothorax is seen.  The cardiomediastinal silhouette is mildly enlarged.  The patient's tracheostomy tube is seen ending 4-5 cm above the carina.  Enteric tube is noted extending below the diaphragm.  No acute osseous abnormalities are seen.  IMPRESSION:  1.  Diffuse bilateral airspace opacification again noted, worse on the right.  This is perhaps slightly worsened from the prior study, and may reflect multifocal pneumonia or pulmonary edema.  Small bilateral pleural effusions noted. 2.  Mild cardiomegaly.   Original Report Authenticated By: Tonia Ghent, M.D.   Dg Chest Port 1 View  11/24/2012   *RADIOLOGY REPORT*  Clinical Data: Follow up pneumonia.  Intubated patient.  Tube placement.  PORTABLE CHEST - 1 VIEW  Comparison: 11/23/2012.  Findings: The cardiopericardial silhouette is enlarged.  There is diffuse right greater than left bilateral airspace disease.  This has progressed compared yesterday's examination.  The endotracheal tube remains present with the tip 6.3 cm from the carina.  Right upper extremity PICC is present with the tip at the level of the carina in the mid SVC.  Enteric tube is present.  There is probably a small right pleural effusion present.  IMPRESSION:  1.  Stable support apparatus. 2.  Worsening bilateral right greater than left airspace disease. This may represent multi focal pneumonia and / or edema associated with CHF in this patient with cardiomegaly.   Original Report Authenticated By: Andreas Newport, M.D.   Chest Portable 1 View To Assess Tube Placement And Rule-out Pneumothorax  11/23/2012   *RADIOLOGY REPORT*  Clinical Data: Evaluate tube placement  PORTABLE CHEST - 1 VIEW  Comparison: 11/23/2012   Findings: The tracheostomy tube is midline and the tip is above the carina.  There is a nasogastric tube with tip in the stomach.  Right arm PICC line tip is in the cavoatrial junction.  The right IJ catheter tip is in the projection of the SVC.  Heart size is moderately enlarged.  There are bilateral pleural effusions and interstitial edema consistent  with CHF.  IMPRESSION:  1. Satisfactory position of the support apparatus including the tracheostomy tube 2.  Moderate CHF.   Original Report Authenticated By: Signa Kell, M.D.   Dg Abd Portable 1v  11/24/2012   *RADIOLOGY REPORT*  Clinical Data: Feeding tube placement.  PORTABLE ABDOMEN - 1 VIEW  Comparison: 11/21/2012  Findings: Nasogastric tube is in the distal stomach region. Nonspecific bowel gas pattern.  Limited evaluation of the lung bases.  IMPRESSION: Nasogastric tube is in the distal stomach region.   Original Report Authenticated By: Richarda Overlie, M.D.   ASSESSMENT / PLAN:  PULMONARY A: Acute respiratory failure 2nd to pulmonary infiltrates >> ? Cause. Failure to wean from ventilator >> s/p trach 7/17. P:   - Changed to pressure control. - Begin PS trials. - F/u CXR. - PRN BD's.  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 A fib with RVR >> amiodarone d/c due to bradycardia, hypotension. Hx of CAD, HTN, hyperlipidemia. Shock >> resolved. P:  - Hold lipitor for now. - Negative fluid balance, lasix 40 mg IV BID. - Solu cortef added 7/15 for ?relative adrenal insufficiency, will maintain while BP is marginal, no pressors for now.  RENAL A: Acute kidney injury >> renal fx stable. Hypernatremia >> improved Hypokalemia. Hypercalcemia >> PTH 678.6 from 7/16. P:   - KVO IV fluids. - Monitor renal fx, urine outpt. - F/U electrolytes - Lasix 40 mg IV BID. - Replace K. - Measure urine calcium excretion.  GASTROINTESTINAL A:  Protein malnutrition. Vomiting 7/14 >> improved. Mass head of pancreas. P:    - Increase TF to goal. - Pepcid for SUP. - Will need further assessment of pancreatic lesion when more stable.  GYN A: Uterine prolapse. P: - GYN consult called on Friday.  HEMATOLOGIC A: Anemia of chronic disease and critical illness >> PRBC transfusion 7/17, 7/18. Chronic coumadin therapy for a fib. P:  - F/u CBC. - Transfuse for Hb < 7. - Continue heparin gtt until more stable to start coumadin.  INFECTIOUS A: Initial concern for aspiration pneumonia >> has persistent pulmonary infiltrates, low grade temperature, and increasing WBC. P:   - D16 of Abx >> continue cefepime pending repeat blood, sputum, urine cx. - D/C vancomycin 7/18.  ENDOCRINE A:  DM2 P:   - SSI. - D/C lantus until sure she can tolerate enteral feedings.  NEUROLOGIC A: Acute encephalopathy 2nd to hypoxia/hypercapnia. Muscular deconditioning. P:   - On 100 mcg/hr of fentanyl drip, will start fentanyl patch 100 mcg q72. - Klonipin for anxiety 1 mg BID. - Will need PT/OT when more stable but hold for today.  CC time 35 minutes.  Alyson Reedy, M.D. [facility] Pulmonary/Critical Care Medicine. Pager: [phone]. After hours pager: [phone].

## 2012-11-25 NOTE — Progress Notes (Signed)
Patient ID: [name], female   DOB: [birth year], 72 y.o.   MRN: [id number]    SUBJECTIVE: Intubated/trached.  She is awake, follows commands.  Hypokalemic this morning.  Atrial fibrillation rate controlled currently but has periods of RVR.  BP remains soft.   Marland Kitchen antiseptic oral rinse  15 mL Mouth Rinse QID  . ceFEPime (MAXIPIME) IV  2 g Intravenous Q24H  . chlorhexidine  15 mL Mouth/Throat BID  . digoxin  0.125 mg Intravenous Daily  . famotidine (PEPCID) IV  20 mg Intravenous Q24H  . furosemide  40 mg Intravenous BID  . hydrocortisone sod succinate (SOLU-CORTEF) inj  50 mg Intravenous Q12H  . insulin aspart  0-20 Units Subcutaneous Q4H  . potassium chloride  10 mEq Intravenous Q1 Hr x 6  . potassium chloride  40 mEq Per Tube BID  . sodium chloride  10-40 mL Intracatheter Q12H      Filed Vitals:   11/25/12 0500 11/25/12 0600 11/25/12 0700 11/25/12 0750  BP: 103/55 100/66 92/63 95/75   Pulse:   71 76  Temp:    98.6 F (37 C)  TempSrc:    Oral  Resp: 17 18 17 19   Height:      Weight:      SpO2: 96% 100% 97% 100%    Intake/Output Summary (Last 24 hours) at 11/25/12 0802 Last data filed at 11/25/12 0700  Gross per 24 hour  Intake 2255.93 ml  Output   2605 ml  Net -349.07 ml    LABS: Basic Metabolic Panel:  Recent Labs  40/10/27 0419 11/24/12 0500 11/25/12 0400  NA 142 142 143  K 3.4* 2.8* 2.5*  CL 114* 112 113*  CO2 19 20 22   GLUCOSE 194* 122* 170*  BUN 42* 32* 32*  CREATININE 1.93* 1.70* 1.76*  CALCIUM 11.3* 11.6* 11.4*  PHOS 3.9  --   --    Liver Function Tests:  Recent Labs  11/23/12 0419 11/24/12 0500  AST  --  20  ALT  --  28  ALKPHOS  --  94  BILITOT  --  0.8  PROT  --  5.4*  ALBUMIN 2.2* 2.4*   No results found for this basename: LIPASE, AMYLASE,  in the last 72 hours CBC:  Recent Labs  11/24/12 2015 11/25/12 0400  WBC 20.2* 20.4*  HGB 8.6* 8.8*  HCT 24.9* 25.9*  MCV 78.8 79.4  PLT 195 176   Cardiac Enzymes: No results found  for this basename: CKTOTAL, CKMB, CKMBINDEX, TROPONINI,  in the last 72 hours BNP: No components found with this basename: POCBNP,  D-Dimer: No results found for this basename: DDIMER,  in the last 72 hours Hemoglobin A1C: No results found for this basename: HGBA1C,  in the last 72 hours Fasting Lipid Panel: No results found for this basename: CHOL, HDL, LDLCALC, TRIG, CHOLHDL, LDLDIRECT,  in the last 72 hours Thyroid Function Tests: No results found for this basename: TSH, T4TOTAL, FREET3, T3FREE, THYROIDAB,  in the last 72 hours Anemia Panel: No results found for this basename: VITAMINB12, FOLATE, FERRITIN, TIBC, IRON, RETICCTPCT,  in the last 72 hours   PHYSICAL EXAM General: Trached Neck: JVP 12 cm, no thyromegaly or thyroid nodule.  Lungs: Coarse BS CV: Nondisplaced PMI.  Heart irregular S1/S2, no S3/S4, 2/6 systolic murmur.  No peripheral edema.   Abdomen: Soft, nontender, no hepatosplenomegaly, no distention.  Neurologic: Alert and oriented x 3.  Psych: Normal affect. Extremities: No clubbing or cyanosis.   TELEMETRY: Reviewed  telemetry pt in atrial fibrillation, HR in 80s  ASSESSMENT AND PLAN: 72 yo with history of HOCM/diastolic CHF transferred from [facility] with respiratory failure.  1. CHF: Acute on chronic diastolic in setting of HOCM.  She is volume overloaded on exam and intubated/trached.  I/Os even yesterday with 20 mg IV Lasix x 1.  Renal function stable.  I will aggressively replete K (IV and via tube) and will increase Lasix to 40 mg IV bid.  Repeat BMET this afternoon to follow K.  2. Atrial fibrillation: Currently controlled but periods of RVR.  Not on nodal blocking agents with soft BP.  I will add digoxin via IV for now.  Creatinine is stable.  3. Respiratory failure: CHF + possible PNA.  Intubated/trached.  On cefepime. Followed by CCM.  4. Renal: Creatinine stable at 1.76 this morning.   Marca Ancona 11/25/2012 8:07 AM

## 2012-11-26 LAB — CBC
HCT: 24.5 % — ABNORMAL LOW (ref 36.0–46.0)
MCH: 26.7 pg (ref 26.0–34.0)
MCHC: 33.1 g/dL (ref 30.0–36.0)
MCV: 80.9 fL (ref 78.0–100.0)
Platelets: 159 10*3/uL (ref 150–400)
RDW: 17.8 % — ABNORMAL HIGH (ref 11.5–15.5)

## 2012-11-26 LAB — BASIC METABOLIC PANEL
BUN: 41 mg/dL — ABNORMAL HIGH (ref 6–23)
Creatinine, Ser: 2.33 mg/dL — ABNORMAL HIGH (ref 0.50–1.10)
GFR calc Af Amer: 23 mL/min — ABNORMAL LOW (ref >90)
GFR calc non Af Amer: 20 mL/min — ABNORMAL LOW (ref >90)
Glucose, Bld: 154 mg/dL — ABNORMAL HIGH (ref 70–99)

## 2012-11-26 LAB — CULTURE, RESPIRATORY W GRAM STAIN: Special Requests: NORMAL

## 2012-11-26 LAB — GLUCOSE, CAPILLARY
Glucose-Capillary: 141 mg/dL — ABNORMAL HIGH (ref 70–99)
Glucose-Capillary: 161 mg/dL — ABNORMAL HIGH (ref 70–99)
Glucose-Capillary: 176 mg/dL — ABNORMAL HIGH (ref 70–99)

## 2012-11-26 MED ORDER — POTASSIUM CHLORIDE 20 MEQ/15ML (10%) PO LIQD
40.0000 meq | Freq: Once | ORAL | Status: AC
  Administered 2012-11-26: 40 meq via ORAL

## 2012-11-26 MED ORDER — ONDANSETRON HCL 4 MG/2ML IJ SOLN
4.0000 mg | Freq: Three times a day (TID) | INTRAMUSCULAR | Status: DC | PRN
  Administered 2012-11-26 – 2012-12-10 (×3): 4 mg via INTRAVENOUS
  Filled 2012-11-26 (×2): qty 2

## 2012-11-26 MED ORDER — NOREPINEPHRINE BITARTRATE 1 MG/ML IJ SOLN
2.0000 ug/min | INTRAVENOUS | Status: DC
  Administered 2012-11-26: 2 ug/min via INTRAVENOUS
  Filled 2012-11-26: qty 4

## 2012-11-26 MED ORDER — ONDANSETRON HCL 4 MG/2ML IJ SOLN
INTRAMUSCULAR | Status: AC
  Filled 2012-11-26: qty 2

## 2012-11-26 NOTE — Progress Notes (Signed)
Pt had episode of vomiting with  tan mucous like emesis. Elink notified, order for zofran received. Pt medicated. Will monitor.

## 2012-11-26 NOTE — Progress Notes (Signed)
[facility] ADULT ICU REPLACEMENT PROTOCOL FOR AM LAB REPLACEMENT ONLY  The patient does not apply for the [facility] Adult ICU Electrolyte Replacment Protocol based on the criteria listed below:   1. Is GFR >/= 40 ml/min? no  Patient's GFR today is 20 2. Is urine output >/= 0.5 ml/kg/hr for the last 6 hours? no Patient's UOP is  ml/kg/hr 3. Is BUN < 60 mg/dL? no  Patient's BUN today is  4. Abnormal electrolyte(s):K 3.2 5. Ordered repletion with: NA 6. If a panic level lab has been reported, has the CCM MD in charge been notified? yes.   Physician:  Dr Darrick Penna  Barnetta Chapel, Thadd Apuzzo A 11/26/2012 6:23 AM

## 2012-11-26 NOTE — Progress Notes (Signed)
Pt having frequent loose stools. Placed rectal tube with max 45 ml of sterile water.  Pt unable to hold tube in. Rectal tube came with patient coughing. Bulb inflated and intact. No trauma noted to rectum. Large prolapse noted from vagina.

## 2012-11-26 NOTE — Progress Notes (Signed)
CONSULT NOTE - Follow Up Consult  Pharmacy Consult for heparin Indication: atrial fibrillation  No Known Allergies  Patient Measurements: Height: 5\' 2"  (157.5 cm) Weight: 186 lb 11.7 oz (84.7 kg) IBW/kg (Calculated) : 50.1 Heparin dosing weight= 68kg  Vital Signs: Temp: 98.3 F (36.8 C) (07/20 0325) Temp src: Oral (07/20 0325) BP: 100/66 mmHg (07/20 0700) Pulse Rate: 77 (07/20 0700)  Labs:  Recent Labs  11/23/12 1355  11/24/12 0500 11/24/12 1846 11/24/12 2015 11/25/12 0400 11/25/12 1555 11/26/12 0440  HGB  --   < > 6.8*  --  8.6* 8.8*  --  8.1*  HCT  --   < > 20.3*  --  24.9* 25.9*  --  24.5*  PLT  --   < > 197  --  195 176  --  159  APTT 29  --   --   --   --   --   --   --   LABPROT 21.6*  --  20.8*  --   --  22.5*  --  19.1*  INR 1.94*  --  1.85*  --   --  2.05*  --  1.66*  HEPARINUNFRC  --   < > <0.10* 0.72*  --  0.37  --  0.30  CREATININE  --   < > 1.70*  --   --  1.76* 1.98* 2.33*  < > = values in this interval not displayed.  Estimated Creatinine Clearance: 22 ml/min (by C-G formula based on Cr of 2.33).   Assessment: 44 YOF with hx of Afib on chronic warfarin therapy.  On heparin bridge while coumadin held.  Of note, patient  had bloody respiratory sections as well as hematuria on 7/17. Noted noted with PRBC transfusion on 7/17 (also 4 units FFP on 7/17) and also PRBC noted on 7/18.  Heparin level is 0.30, INR noted as 1.66 (has been 1.66-2.59 since 7/14 and last dose of coumadin was 7/14) and Hg= 8.1  Goal of Therapy:  Heparin level= 0.3-0.7 (target lower end of range due to anemia and bleeding) Monitor platelets by anticoagulation protocol: Yes  Plan:  -No heparin changes needed -Daily heparin level and CBC  Harland German, Pharm D 11/26/2012 8:26 AM

## 2012-11-26 NOTE — Progress Notes (Signed)
Pt having several periods of urine leaking from foley.  Foley tubing noted to be out quite a bit.  Very little collected in foley bag, and linens were wet.  Pericare done extensively (due to complication of prolapse) and under sterile technique advanced foley tip forward and re-inflated bulb with saline.  Leg strap applied.

## 2012-11-26 NOTE — Progress Notes (Signed)
Patient ID: [name], female   DOB: 10/04/1940, 72 y.o.   MRN: [id number]    SUBJECTIVE: Intubated/trached.  She is awake, follows commands. Atrial fibrillation rate controlled currently but has periods of RVR.  BP remains soft.   Marland Kitchen antiseptic oral rinse  15 mL Mouth Rinse QID  . ceFEPime (MAXIPIME) IV  2 g Intravenous Q24H  . chlorhexidine  15 mL Mouth/Throat BID  . clonazePAM  1 mg Oral BID  . digoxin  0.125 mg Intravenous Daily  . famotidine (PEPCID) IV  20 mg Intravenous Q24H  . fentaNYL  100 mcg Transdermal Q72H  . hydrocortisone sod succinate (SOLU-CORTEF) inj  50 mg Intravenous Q12H  . insulin aspart  0-20 Units Subcutaneous Q4H  . potassium chloride  40 mEq Oral Once  . potassium chloride  40 mEq Per Tube BID  . sodium chloride  10-40 mL Intracatheter Q12H  heparin gtt    Filed Vitals:   11/26/12 0425 11/26/12 0500 11/26/12 0600 11/26/12 0700  BP: 84/45 87/55 87/56  100/66  Pulse: 85 82 84 77  Temp:      TempSrc:      Resp: 16 16 16 16   Height:      Weight:      SpO2: 99% 96% 97% 98%    Intake/Output Summary (Last 24 hours) at 11/26/12 0804 Last data filed at 11/26/12 0600  Gross per 24 hour  Intake   1391 ml  Output   3320 ml  Net  -1929 ml    LABS: Basic Metabolic Panel:  Recent Labs  40/98/11 1555 11/26/12 0440  NA 143 148*  K 3.4* 3.2*  CL 112 116*  CO2 23 22  GLUCOSE 150* 154*  BUN 36* 41*  CREATININE 1.98* 2.33*  CALCIUM 11.6* 12.5*  MG  --  2.0  PHOS  --  3.3   Liver Function Tests:  Recent Labs  11/24/12 0500  AST 20  ALT 28  ALKPHOS 94  BILITOT 0.8  PROT 5.4*  ALBUMIN 2.4*   No results found for this basename: LIPASE, AMYLASE,  in the last 72 hours CBC:  Recent Labs  11/25/12 0400 11/26/12 0440  WBC 20.4* 19.7*  HGB 8.8* 8.1*  HCT 25.9* 24.5*  MCV 79.4 80.9  PLT 176 159   Cardiac Enzymes: No results found for this basename: CKTOTAL, CKMB, CKMBINDEX, TROPONINI,  in the last 72 hours BNP: No components found  with this basename: POCBNP,  D-Dimer: No results found for this basename: DDIMER,  in the last 72 hours Hemoglobin A1C: No results found for this basename: HGBA1C,  in the last 72 hours Fasting Lipid Panel: No results found for this basename: CHOL, HDL, LDLCALC, TRIG, CHOLHDL, LDLDIRECT,  in the last 72 hours Thyroid Function Tests: No results found for this basename: TSH, T4TOTAL, FREET3, T3FREE, THYROIDAB,  in the last 72 hours Anemia Panel: No results found for this basename: VITAMINB12, FOLATE, FERRITIN, TIBC, IRON, RETICCTPCT,  in the last 72 hours   PHYSICAL EXAM General: Trached Neck: JVP 8 cm, no thyromegaly or thyroid nodule.  Lungs: Coarse BS CV: Nondisplaced PMI.  Heart irregular S1/S2, no S3/S4, 2/6 systolic murmur.  No peripheral edema.   Abdomen: Soft, nontender, no hepatosplenomegaly, no distention.  Neurologic: Alert and oriented x 3.  Psych: Normal affect. Extremities: No clubbing or cyanosis.   TELEMETRY: Reviewed telemetry pt in atrial fibrillation, HR in 80s  ASSESSMENT AND PLAN: 72 yo with history of HOCM/diastolic CHF transferred from [facility] with respiratory  failure.  1. CHF: Acute on chronic diastolic in setting of HOCM.  She is volume overloaded on exam and intubated/trached.  She diuresed reasonably yesterday but creatinine is up. Will hold Lasix today.  2. Atrial fibrillation: Currently controlled but periods of RVR.  Not on nodal blocking agents with soft BP.  Continue digoxin for now.  She is on heparin gtt.  3. Respiratory failure: CHF + possible PNA.  Intubated/trached.  On cefepime. Followed by CCM.  4. Renal: Creatinine higher with diuresis.  Holding Lasix .  Marca Ancona 11/26/2012 8:04 AM

## 2012-11-26 NOTE — Progress Notes (Signed)
PULMONARY  / CRITICAL CARE MEDICINE  Name: [name] MRN: [id number] DOB: 1940-09-18    ADMISSION DATE:  11/09/2012 CONSULTATION DATE:  11/09/2012  REFERRING MD :  Preston Fleeting PRIMARY SERVICE: PCCM  CHIEF COMPLAINT:  Shortness of breath.  BRIEF PATIENT DESCRIPTION: 72 y/o female with CHF was admitted on 7/3 from the [facility] ED with acute hypoxemic respiratory failure due to a CHF exacerbation, ?Superimposed PNA.  LINES / TUBES: 7/3 ETT >>11/13/12, 11/13/12  (failed extubation immediately, ? aspirated) >> 7/17 7/4 CVC R IJ >> 7/17 7/5 A-line >> 7/18 7/17 Trach (DF) >> 7/17 Rt PICC >>   CULTURES: 7/4 U strep >> neg 7/4 legionella >> neg 7/3 mrsa PCR >> POSITIVE 7/8 mini-BAL >> yeast 7/16 sputum >>  7/16 blood >> 7/16 urine >> yeast 50 K colonies  ANTIBIOTICS: 7/3 Ceftriaxone >>7/6 7/4 levofloxacin >>7/7 7/4 Vancomycin >>7/6, restarted 7/8 >> 7/18 7/7 Unasyn >> 7/16 7/16 cefepime >>   SIGNIFICANT EVENTS: 7/3 admission 7/07 reintubated immediately after extubation.  ? Aspiration. ABX restarted.  on Neo-Synephrine.  7/09 Able to wean fio2 and neo. Lopressor required for rate control of afib. 7/10 Off neo, but now tachycardic.  Failed SBT this AM, but RT felt it was due to sedation.  Sedation weaned 7/14 Hypotensive, vomiting, amiodarone turned off for low heart rate 7/16 Change to pressure control 7/17 Start pressors, PRBC transfusion, trach 7/18 Off pressors, PRBC transfusion  STUDIES: 7/16 CT chest >> emphysema, b/l ASD, small effusions 7/16 CT abd/pelvis >> 2.6 x 2 cm nodule in head of pancreas, bone changes suggestive of Paget's disease  SUBJECTIVE: Off pressors.  Decreased secretions.  VITAL SIGNS: Temp:  [97 F (36.1 C)-99.8 F (37.7 C)] 97 F (36.1 C) (07/20 0800) Pulse Rate:  [67-146] 88 (07/20 0857) Resp:  [16-48] 19 (07/20 0857) BP: (65-130)/(45-89) 106/68 mmHg (07/20 0857) SpO2:  [87 %-100 %] 98 % (07/20 0857) FiO2 (%):  [40 %-60 %] 40 % (07/20 0857) Weight:   [84.7 kg (186 lb 11.7 oz)] 84.7 kg (186 lb 11.7 oz) (07/20 0400)  HEMODYNAMICS: CVP:  [7 mmHg-19 mmHg] 14 mmHg  VENTILATOR SETTINGS: Vent Mode:  [-] PSV FiO2 (%):  [40 %-60 %] 40 % Set Rate:  [16 bmp] 16 bmp PEEP:  [5 cmH20-8 cmH20] 5 cmH20 Pressure Support:  [5 cmH20] 5 cmH20 Plateau Pressure:  [23 cmH20-27 cmH20] 24 cmH20  INTAKE / OUTPUT: Intake/Output     07/19 0701 - 07/20 0700 07/20 0701 - 07/21 0700   I.V. (mL/kg) 571.5 (6.7)    Blood     NG/GT 650    IV Piggyback 250    Total Intake(mL/kg) 1471.5 (17.4)    Urine (mL/kg/hr) 3390 (1.7)    Total Output 3390     Net -1918.5          Urine Occurrence 1 x    Stool Occurrence 3 x    Emesis Occurrence 1 x     PHYSICAL EXAMINATION: Gen: No distress, much calmer this AM. HEENT: NG tube in place, trach site clean PULM: scattered rhonchi CV: irregular AB: soft, non tender GU: uterine prolapse Ext: no edema Derm: no rash Neuro: RASS 0, moves extremities, follows simple commands  LABS: CBC Recent Labs     11/24/12  2015  11/25/12  0400  11/26/12  0440  WBC  20.2*  20.4*  19.7*  HGB  8.6*  8.8*  8.1*  HCT  24.9*  25.9*  24.5*  PLT  195  176  159   Coag's Recent Labs     11/23/12  1355  11/24/12  0500  11/25/12  0400  11/26/12  0440  APTT  29   --    --    --   INR  1.94*  1.85*  2.05*  1.66*   BMET Recent Labs     11/25/12  0400  11/25/12  1555  11/26/12  0440  NA  143  143  148*  K  2.5*  3.4*  3.2*  CL  113*  112  116*  CO2  22  23  22   BUN  32*  36*  41*  CREATININE  1.76*  1.98*  2.33*  GLUCOSE  170*  150*  154*   Electrolytes Recent Labs     11/25/12  0400  11/25/12  1555  11/26/12  0440  CALCIUM  11.4*  11.6*  12.5*  MG   --    --   2.0  PHOS   --    --   3.3   Glucose Recent Labs     11/25/12  0749  11/25/12  1129  11/25/12  1624  11/25/12  2057  11/25/12  2315  11/26/12  0323  GLUCAP  129*  179*  142*  196*  163*  141*    Imaging Dg Chest Port 1 View  11/25/2012    *RADIOLOGY REPORT*  Clinical Data: Follow up pneumonia.  PORTABLE CHEST - 1 VIEW  Comparison: Chest radiograph performed 11/24/2012  Findings: The lungs are relatively well expanded.  Diffuse bilateral airspace opacification is again noted, worse on the right.  This may reflect multifocal pneumonia or pulmonary edema. Small bilateral pleural effusions are noted.  No pneumothorax is seen.  The cardiomediastinal silhouette is mildly enlarged.  The patient's tracheostomy tube is seen ending 4-5 cm above the carina.  Enteric tube is noted extending below the diaphragm.  No acute osseous abnormalities are seen.  IMPRESSION:  1.  Diffuse bilateral airspace opacification again noted, worse on the right.  This is perhaps slightly worsened from the prior study, and may reflect multifocal pneumonia or pulmonary edema.  Small bilateral pleural effusions noted. 2.  Mild cardiomegaly.   Original Report Authenticated By: Tonia Ghent, M.D.   ASSESSMENT / PLAN:  PULMONARY A: Acute respiratory failure 2nd to pulmonary infiltrates >> ? Cause. Failure to wean from ventilator >> s/p trach 7/17. P:   - PS trials today and advance to TC as tolerated, full vent support at night then TC again in AM. - F/u CXR. - PRN BD's.  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 A fib with RVR >> amiodarone d/c due to bradycardia, hypotension. Hx of CAD, HTN, hyperlipidemia. Shock >> resolved. P:  - Hold lipitor for now. - Negative fluid balance, lasix 40 mg IV BID. - Solu cortef added 7/15 for ?relative adrenal insufficiency, will maintain while BP is marginal, no pressors for now.  RENAL A: Acute kidney injury >> renal fx stable. Hypernatremia >> improved Hypokalemia. Hypercalcemia >> PTH 678.6 from 7/16. P:   - KVO IV fluids. - Monitor renal fx, urine outpt. - F/U electrolytes - Lasix 40 mg IV BID. - Replace K. - Measure urine calcium excretion.  GASTROINTESTINAL A:  Protein  malnutrition. Vomiting 7/14 >> improved. Mass head of pancreas. P:   - Increase TF to goal. - Pepcid for SUP. - Will need further assessment of pancreatic lesion when more stable.  GYN A: Uterine  prolapse. P: - GYN consult called on Friday.  HEMATOLOGIC A: Anemia of chronic disease and critical illness >> PRBC transfusion 7/17, 7/18. Chronic coumadin therapy for a fib. P:  - F/u CBC. - Transfuse for Hb < 7. - Continue heparin gtt until more stable to start coumadin.  INFECTIOUS A: Initial concern for aspiration pneumonia >> has persistent pulmonary infiltrates, low grade temperature, and increasing WBC. P:   - D17 of Abx >> continue cefepime pending repeat blood, sputum, urine cx. - D/Ced vancomycin 7/18.  ENDOCRINE A:  DM2 P:   - SSI. - D/Ced lantus until sure she can tolerate enteral feedings.  NEUROLOGIC A: Acute encephalopathy 2nd to hypoxia/hypercapnia. Muscular deconditioning. P:   - Continue fentanyl patch 100 mcg q72. - Klonipin for anxiety 1 mg BID. - Will need PT/OT when more stable but hold for today.  CC time 35 minutes.  Alyson Reedy, M.D. [facility] Pulmonary/Critical Care Medicine. Pager: [phone]. After hours pager: [phone].

## 2012-11-26 NOTE — Progress Notes (Signed)
Pt voiding around Foley. No urine noted in Foley bag. Pt on IV lasix and needs strict I&O.  Spoke with Dr. Herma Carson at [facility] concerning continued Foley leakage and prolapse. Recommended to remove current foley cath and place larger cath. 14Fr cath removed. Peri care performed. 16 Fr cath inserted using sterile technique. Leg strap applied. 50 ml of cloudy yellow urine noted on insertion. Pt tolerated well. Will continue to monitor.

## 2012-11-27 ENCOUNTER — Encounter (HOSPITAL_COMMUNITY): Payer: Self-pay | Admitting: Radiology

## 2012-11-27 LAB — BASIC METABOLIC PANEL
BUN: 49 mg/dL — ABNORMAL HIGH (ref 6–23)
Creatinine, Ser: 2.45 mg/dL — ABNORMAL HIGH (ref 0.50–1.10)
GFR calc Af Amer: 22 mL/min — ABNORMAL LOW (ref >90)
GFR calc non Af Amer: 19 mL/min — ABNORMAL LOW (ref >90)
Potassium: 4.8 mEq/L (ref 3.5–5.1)

## 2012-11-27 LAB — CBC
MCH: 26.8 pg (ref 26.0–34.0)
MCV: 81.6 fL (ref 78.0–100.0)
Platelets: 188 10*3/uL (ref 150–400)
RDW: 18.5 % — ABNORMAL HIGH (ref 11.5–15.5)
WBC: 28.2 10*3/uL — ABNORMAL HIGH (ref 4.0–10.5)

## 2012-11-27 LAB — GLUCOSE, CAPILLARY
Glucose-Capillary: 229 mg/dL — ABNORMAL HIGH (ref 70–99)
Glucose-Capillary: 188 mg/dL — ABNORMAL HIGH (ref 70–99)
Glucose-Capillary: 166 mg/dL — ABNORMAL HIGH (ref 70–99)
Glucose-Capillary: 189 mg/dL — ABNORMAL HIGH (ref 70–99)

## 2012-11-27 MED ORDER — ALENDRONATE SODIUM 10 MG PO TABS: 5.0000 mg | ORAL_TABLET | Freq: Every day | ORAL | Status: DC

## 2012-11-27 MED ORDER — PRO-STAT SUGAR FREE PO LIQD
60.0000 mL | Freq: Three times a day (TID) | ORAL | Status: DC
  Administered 2012-11-27 – 2012-12-07 (×26): 60 mL via ORAL
  Filled 2012-11-27 (×33): qty 60

## 2012-11-27 MED ORDER — FAMOTIDINE 40 MG/5ML PO SUSR
20.0000 mg | Freq: Every day | ORAL | Status: DC
  Administered 2012-11-27 – 2012-12-22 (×25): 20 mg
  Filled 2012-11-27 (×26): qty 2.5

## 2012-11-27 MED ORDER — HEPARIN (PORCINE) IN NACL 100-0.45 UNIT/ML-% IJ SOLN
1050.0000 [IU]/h | INTRAMUSCULAR | Status: DC
  Administered 2012-11-28: 1050 [IU]/h via INTRAVENOUS
  Filled 2012-11-27 (×2): qty 250

## 2012-11-27 NOTE — H&P (Signed)
Agree 

## 2012-11-27 NOTE — Progress Notes (Signed)
ANTICOAGULATION CONSULT NOTE - Follow Up Consult  Pharmacy Consult for Heparin Indication: atrial fibrillation  No Known Allergies  Patient Measurements: Height: 5\' 2"  (157.5 cm) Weight: 186 lb 11.7 oz (84.7 kg) IBW/kg (Calculated) : 50.1 Heparin Dosing Weight: 68kg  Vital Signs: Temp: 97.7 F (36.5 C) (07/21 0400) Temp src: Oral (07/21 0400) BP: 100/63 mmHg (07/21 0600) Pulse Rate: 77 (07/21 0600)  Labs:  Recent Labs  11/25/12 0400 11/25/12 1555 11/26/12 0440 11/27/12 0430  HGB 8.8*  --  8.1* 8.6*  HCT 25.9*  --  24.5* 26.2*  PLT 176  --  159 188  LABPROT 22.5*  --  19.1* 17.0*  INR 2.05*  --  1.66* 1.42  HEPARINUNFRC 0.37  --  0.30 0.35  CREATININE 1.76* 1.98* 2.33* 2.45*    Estimated Creatinine Clearance: 20.9 ml/min (by C-G formula based on Cr of 2.45).   Medications:  Heparin @ 1050 units/hr  Assessment: 72yof with hx of Afib on chronic warfarin therapy. On heparin bridge while coumadin held. Heparin level today is therapeutic. INR is 1.42 (has been 1.66-2.59 since 7/14 and last dose of coumadin was 7/14). Hgb is low but stable at 8.6, platelets pending.  Of note, patient had bloody respiratory sections as well as hematuria on 7/17. Noted with PRBC transfusion on 7/17, 7/18 (also 4 units FFP on 7/17). No further bleeding noted.  Goal of Therapy:  Heparin level 0.3-0.7 units/ml (targeting lower end of range due to anemia and bleeding) Monitor platelets by anticoagulation protocol: Yes   Plan:  1) Continue heparin at 1050 units/hr 2) Follow up heparin level and CBC in AM  Fredrik Rigger 11/27/2012,8:23 AM

## 2012-11-27 NOTE — Progress Notes (Signed)
CRITICAL VALUE ALERT  Critical value received:  Ca level 13.5   Date of notification:  11/27/12  Time of notification:  0628  Critical value read back:yes  Nurse who received alert: Madalyn Rob   MD notified (1st page):  Dr. Darrick Penna  Time of first page:  0628  MD notified (2nd page):  Time of second page:  Responding MD:  Dr. Darrick Penna  Time MD responded:  [phone]

## 2012-11-27 NOTE — Progress Notes (Signed)
NUTRITION FOLLOW UP  Intervention:   1.  Enteral nutrition; If pt able to tolerate trickle feeds of Osmolite 1.2 @ 20 mL/hr, recommend advanced to 25 mL/hr goal and resume of protein supplements- Prostat 5 times daily. Nutrition regimen would provide 1220 kcal, 108g protein, and 462 mL free water.  RD to order Prostat 6 times daily with current TF regimen to provide 1176 kcal, 116g protein, and 393 mL free water.   Nutrition Dx:   Inadequate oral intake, ongoing  Monitor:   1.  Enteral nutrition; .Enteral nutrition to provide 60-70% of estimated calorie needs (22-25 kcals/kg ideal body weight) and 100% of estimated protein needs, based on ASPEN guidelines for permissive underfeeding in critically ill obese individuals. 2.  Wt/wt change; monitor trends  Assessment:   Pt admitted with respiratory failure due to CHF exacerbation. Pt remains intubated after episode of vomiting.  S/p bedside trach yesterday.  Per RN, pt is to receive trach tomorrow.  Patient is currently intubated on ventilator support. NGT for access. MV: 8.1 L/min Temp:Temp (24hrs), Avg:97.4 F (36.3 C), Min:96.8 F (36 C), Max:97.8 F (36.6 C) Pt on vent support at time of visit.   Currently receiving Osmolite 1.2 @ 20 mL/hr.    Height: Ht Readings from Last 1 Encounters:  11/09/12 5\' 2"  (1.575 m)    Weight Status:   Wt Readings from Last 1 Encounters:  11/26/12 186 lb 11.7 oz (84.7 kg)    Re-estimated needs:  Kcal: 1649 Permissive underfeeding goals:  1150-1250 kcal/d  Protein: >/=100g Fluid: ~1.8 L/day  Skin: intact, non-pitting edema  Diet Order: NPO   Intake/Output Summary (Last 24 hours) at 11/27/12 1456 Last data filed at 11/27/12 1100  Gross per 24 hour  Intake 1665.28 ml  Output    870 ml  Net 795.28 ml    Last BM: 7/18   Labs:   Recent Labs Lab 11/23/12 0419  11/25/12 1555 11/26/12 0440 11/27/12 0430  NA 142  < > 143 148* 147*  K 3.4*  < > 3.4* 3.2* 4.8  CL 114*  < > 112  116* 117*  CO2 19  < > 23 22 22   BUN 42*  < > 36* 41* 49*  CREATININE 1.93*  < > 1.98* 2.33* 2.45*  CALCIUM 11.3*  < > 11.6* 12.5* 13.5*  MG  --   --   --  2.0 2.2  PHOS 3.9  --   --  3.3 3.2  GLUCOSE 194*  < > 150* 154* 205*  < > = values in this interval not displayed.  CBG (last 3)   Recent Labs  11/27/12 0404 11/27/12 0749 11/27/12 1146  GLUCAP 185* 229* 188*    Scheduled Meds: . antiseptic oral rinse  15 mL Mouth Rinse QID  . ceFEPime (MAXIPIME) IV  2 g Intravenous Q24H  . chlorhexidine  15 mL Mouth/Throat BID  . clonazePAM  1 mg Oral BID  . digoxin  0.125 mg Intravenous Daily  . famotidine  20 mg Per Tube Daily  . fentaNYL  100 mcg Transdermal Q72H  . hydrocortisone sod succinate (SOLU-CORTEF) inj  50 mg Intravenous Q12H  . insulin aspart  0-20 Units Subcutaneous Q4H  . potassium chloride  40 mEq Per Tube BID  . sodium chloride  10-40 mL Intracatheter Q12H    Continuous Infusions: . sodium chloride 100 mL/hr at 11/27/12 0641  . sodium chloride Stopped (11/25/12 1600)  . amiodarone (NEXTERONE PREMIX) 360 mg/200 mL dextrose 30 mg/hr (11/24/12  2130)  . feeding supplement (OSMOLITE 1.2 CAL) 1,000 mL (11/27/12 1051)  . fentaNYL infusion INTRAVENOUS Stopped (11/25/12 1200)  . heparin 1,050 Units/hr (11/27/12 1109)  . norepinephrine (LEVOPHED) Adult infusion Stopped (11/27/12 0931)    Loyce Dys, MS RD LDN Clinical Inpatient Dietitian Pager: [phone] Weekend/After hours pager: [phone]

## 2012-11-27 NOTE — Progress Notes (Signed)

## 2012-11-27 NOTE — Progress Notes (Signed)
SUBJECTIVE:  She is awake.  Indicates no pain.    PHYSICAL EXAM Filed Vitals:   11/27/12 0319 11/27/12 0400 11/27/12 0500 11/27/12 0600  BP: 92/47 100/58 115/65 100/63  Pulse: 67 68 80 77  Temp:  97.7 F (36.5 C)    TempSrc:  Oral    Resp: 18 16 17 16   Height:      Weight:      SpO2: 94% 95% 96% 96%   General:  No distress Lungs:  Clear Heart:  Irregular Abdomen:  Positive bowel sounds, no rebound no guarding Extremities:  No edema, nonfocal Neuro:  Nonfocal  LABS:  Results for orders placed during the hospital encounter of 11/09/12 (from the past 24 hour(s))  GLUCOSE, CAPILLARY     Status: Abnormal   Collection Time    11/26/12  7:54 AM      Result Value Range   Glucose-Capillary 161 (*) 70 - 99 mg/dL  GLUCOSE, CAPILLARY     Status: Abnormal   Collection Time    11/26/12 12:33 PM      Result Value Range   Glucose-Capillary 176 (*) 70 - 99 mg/dL  GLUCOSE, CAPILLARY     Status: Abnormal   Collection Time    11/26/12  4:34 PM      Result Value Range   Glucose-Capillary 184 (*) 70 - 99 mg/dL  GLUCOSE, CAPILLARY     Status: Abnormal   Collection Time    11/26/12  7:53 PM      Result Value Range   Glucose-Capillary 176 (*) 70 - 99 mg/dL  GLUCOSE, CAPILLARY     Status: Abnormal   Collection Time    11/26/12 11:40 PM      Result Value Range   Glucose-Capillary 172 (*) 70 - 99 mg/dL  GLUCOSE, CAPILLARY     Status: Abnormal   Collection Time    11/27/12  4:04 AM      Result Value Range   Glucose-Capillary 185 (*) 70 - 99 mg/dL  PROTIME-INR     Status: Abnormal   Collection Time    11/27/12  4:30 AM      Result Value Range   Prothrombin Time 17.0 (*) 11.6 - 15.2 seconds   INR 1.42  0.00 - 1.49  CBC     Status: Abnormal (Preliminary result)   Collection Time    11/27/12  4:30 AM      Result Value Range   WBC PENDING  4.0 - 10.5 K/uL   RBC 3.21 (*) 3.87 - 5.11 MIL/uL   Hemoglobin 8.6 (*) 12.0 - 15.0 g/dL   HCT 14.7 (*) 82.9 - 56.2 %   MCV 81.6  78.0 -  100.0 fL   MCH 26.8  26.0 - 34.0 pg   MCHC 32.8  30.0 - 36.0 g/dL   RDW 13.0 (*) 86.5 - 78.4 %   Platelets PENDING  150 - 400 K/uL  HEPARIN LEVEL (UNFRACTIONATED)     Status: None   Collection Time    11/27/12  4:30 AM      Result Value Range   Heparin Unfractionated 0.35  0.30 - 0.70 IU/mL  BASIC METABOLIC PANEL     Status: Abnormal   Collection Time    11/27/12  4:30 AM      Result Value Range   Sodium 147 (*) 135 - 145 mEq/L   Potassium 4.8  3.5 - 5.1 mEq/L   Chloride 117 (*) 96 - 112 mEq/L   CO2 22  19 - 32 mEq/L   Glucose, Bld 205 (*) 70 - 99 mg/dL   BUN 49 (*) 6 - 23 mg/dL   Creatinine, Ser 1.61 (*) 0.50 - 1.10 mg/dL   Calcium 09.6 (*) 8.4 - 10.5 mg/dL   GFR calc non Af Amer 19 (*) >90 mL/min   GFR calc Af Amer 22 (*) >90 mL/min  MAGNESIUM     Status: None   Collection Time    11/27/12  4:30 AM      Result Value Range   Magnesium 2.2  1.5 - 2.5 mg/dL  PHOSPHORUS     Status: None   Collection Time    11/27/12  4:30 AM      Result Value Range   Phosphorus 3.2  2.3 - 4.6 mg/dL    Intake/Output Summary (Last 24 hours) at 11/27/12 0746 Last data filed at 11/27/12 0600  Gross per 24 hour  Intake 1286.28 ml  Output   1160 ml  Net 126.28 ml    ASSESSMENT AND PLAN:  Acute on chronic diastolic congestive heart failure:  Treatment complicated by Foley issues.   Lasix held as creat bumped with diuresis.  Keep I/O even.   CHRONIC KIDNEY DISEASE STAGE V:  Creat is much improved.   FIBRILLATION, ATRIAL:  Off of amiodarone secondary to low HR.  Rate is much better off of pressors with trach.  Continue rate control and anticoagulation for now.   Acute respiratory failure with hypoxia:  Status post trach.     Rollene Rotunda 11/27/2012 7:46 AM

## 2012-11-27 NOTE — Progress Notes (Signed)
PULMONARY  / CRITICAL CARE MEDICINE  Name: [name] MRN: [id number] DOB: 11-Sep-1940    ADMISSION DATE:  11/09/2012 CONSULTATION DATE:  11/09/2012  REFERRING MD :  Preston Fleeting PRIMARY SERVICE: PCCM  CHIEF COMPLAINT:  Shortness of breath.  BRIEF PATIENT DESCRIPTION: 72 y/o female with CHF was admitted on 7/3 from the [facility] ED with acute hypoxemic respiratory failure due to a CHF exacerbation, ?Superimposed PNA.  LINES / TUBES: 7/3 ETT >>11/13/12, 11/13/12  (failed extubation immediately, ? aspirated) >> 7/17 7/4 CVC R IJ >> 7/17 7/5 A-line >> 7/18 7/17 Trach (DF) >> 7/17 Rt PICC >>   CULTURES: 7/4 U strep >> neg 7/4 legionella >> neg 7/3 mrsa PCR >> POSITIVE 7/8 mini-BAL >> yeast 7/16 sputum >>  7/16 blood >> 7/16 urine >> yeast 50 K colonies  ANTIBIOTICS: 7/3 Ceftriaxone >>7/6 7/4 levofloxacin >>7/7 7/4 Vancomycin >>7/6, restarted 7/8 >> 7/18 7/7 Unasyn >> 7/16 7/16 cefepime >>   SIGNIFICANT EVENTS: 7/3 admission 7/07 reintubated immediately after extubation.  ? Aspiration. ABX restarted.  on Neo-Synephrine.  7/09 Able to wean fio2 and neo. Lopressor required for rate control of afib. 7/10 Off neo, but now tachycardic.  Failed SBT this AM, but RT felt it was due to sedation.  Sedation weaned 7/14 Hypotensive, vomiting, amiodarone turned off for low heart rate 7/16 Change to pressure control 7/17 Start pressors, PRBC transfusion, trach 7/18 Off pressors, PRBC transfusion  STUDIES: 7/16 CT chest >> emphysema, b/l ASD, small effusions 7/16 CT abd/pelvis >> 2.6 x 2 cm nodule in head of pancreas, bone changes suggestive of Paget's disease  SUBJECTIVE: Off pressors.  Decreased secretions.  VITAL SIGNS: Temp:  [96.8 F (36 C)-97.8 F (36.6 C)] 96.8 F (36 C) (07/21 1157) Pulse Rate:  [57-142] 115 (07/21 1127) Resp:  [14-27] 26 (07/21 1127) BP: (73-141)/(33-91) 114/91 mmHg (07/21 1127) SpO2:  [90 %-100 %] 100 % (07/21 1127) FiO2 (%):  [40 %] 40 % (07/21  1127)  HEMODYNAMICS: CVP:  [10 mmHg-15 mmHg] 13 mmHg  VENTILATOR SETTINGS: Vent Mode:  [-] CPAP;PSV FiO2 (%):  [40 %] 40 % Set Rate:  [16 bmp] 16 bmp PEEP:  [5 cmH20] 5 cmH20 Pressure Support:  [14 cmH20] 14 cmH20 Plateau Pressure:  [24 cmH20-26 cmH20] 25 cmH20  INTAKE / OUTPUT: Intake/Output     07/20 0701 - 07/21 0700 07/21 0701 - 07/22 0700   I.V. (mL/kg) 635.5 (7.5) 453.3 (5.4)   NG/GT 570 180   IV Piggyback 100 50   Total Intake(mL/kg) 1305.5 (15.4) 683.3 (8.1)   Urine (mL/kg/hr) 1160 (0.6) 310 (0.6)   Total Output 1160 310   Net +145.5 +373.3        Urine Occurrence 3 x     PHYSICAL EXAMINATION: Gen: No distress, much calmer this AM. HEENT: NG tube in place, trach site clean PULM: scattered rhonchi CV: irregular AB: soft, non tender GU: uterine prolapse Ext: no edema Derm: no rash Neuro: RASS 0, moves extremities, follows simple commands  LABS: CBC Recent Labs     11/25/12  0400  11/26/12  0440  11/27/12  0430  WBC  20.4*  19.7*  28.2*  HGB  8.8*  8.1*  8.6*  HCT  25.9*  24.5*  26.2*  PLT  176  159  188   Coag's Recent Labs     11/25/12  0400  11/26/12  0440  11/27/12  0430  INR  2.05*  1.66*  1.42   BMET Recent Labs  11/25/12  1555  11/26/12  0440  11/27/12  0430  NA  143  148*  147*  K  3.4*  3.2*  4.8  CL  112  116*  117*  CO2  23  22  22   BUN  36*  41*  49*  CREATININE  1.98*  2.33*  2.45*  GLUCOSE  150*  154*  205*   Electrolytes Recent Labs     11/25/12  1555  11/26/12  0440  11/27/12  0430  CALCIUM  11.6*  12.5*  13.5*  MG   --   2.0  2.2  PHOS   --   3.3  3.2   Glucose Recent Labs     11/26/12  1634  11/26/12  1953  11/26/12  2340  11/27/12  0404  11/27/12  0749  11/27/12  1146  GLUCAP  184*  176*  172*  185*  229*  188*    Imaging No results found. ASSESSMENT / PLAN:  PULMONARY A: Acute respiratory failure 2nd to pulmonary infiltrates >> ? Cause. Failure to wean from ventilator >> s/p trach 7/17. P:    - Tolerated TC overnight but RR is very elevated, will place back on vent. - F/u CXR. - PRN BD's.  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 A fib with RVR >> amiodarone d/c due to bradycardia, hypotension. Hx of CAD, HTN, hyperlipidemia. Shock >> requiring levophed overnight P:  - Hold lipitor for now. - Continue lasix 40 mg IV BID. - Solu cortef added 7/15 for ?relative adrenal insufficiency, will maintain while BP is marginal and on pressors for now.  RENAL A: Acute kidney injury >> renal fx stable. Hypernatremia >> improved Hypokalemia. Hypercalcemia >> PTH 678.6 from 7/16. P:   - KVO IV fluids. - Monitor renal fx, urine outpt. - F/U electrolytes - Lasix 40 mg IV BID. - Replace K. - Measure urine calcium excretion.  GASTROINTESTINAL A:  Protein malnutrition. Vomiting 7/14 >> improved. Mass head of pancreas. Spoke with family, ok with PEG placement P:   - Increase TF to goal. - Pepcid for SUP. - Will need further assessment of pancreatic lesion when more stable. - Consult IR for PEG placement.  GYN A: Uterine prolapse. P: - GYN consult called on Friday, prolapse per GYN.  HEMATOLOGIC A: Anemia of chronic disease and critical illness >> PRBC transfusion 7/17, 7/18. Chronic coumadin therapy for a fib. P:  - F/u CBC. - Transfuse for Hb < 7. - Continue heparin gtt until more stable to start coumadin.  INFECTIOUS A: Initial concern for aspiration pneumonia >> has persistent pulmonary infiltrates, low grade temperature, and increasing WBC. P:   - D18 of Abx >> continue cefepime, will d/c after placement of PEG. - D/Ced vancomycin 7/18.  ENDOCRINE A:  DM2 P:   - SSI. - D/Ced lantus until sure she can tolerate enteral feedings.  NEUROLOGIC A: Acute encephalopathy 2nd to hypoxia/hypercapnia. Muscular deconditioning. P:   - Continue fentanyl patch 100 mcg q72. - Klonipin for anxiety 1 mg BID. - Will need PT/OT when more  stable but hold for today.  CC time 35 minutes.  Alyson Reedy, M.D. [facility] Pulmonary/Critical Care Medicine. Pager: [phone]. After hours pager: [phone].

## 2012-11-27 NOTE — H&P (Signed)
Referring Physician: Dr. Molli Knock HPI: [name] is an 72 y.o. female who was admitted with acute on chronic CHF with respiratory failure, unable to wean off ventilator s/p tracheostomy. Patient has been receiving IV antibiotics for possible infiltrates, blood cultures show no growth and pending. Urine cultures (+) yeast, sputum culture (+) candida. Patient is afebrile, however wbc increasing 28.2 (19.7) patient has received solu-cortef for relative adrenal insufficiency while on IV pressors for shock. Patient with protein malnutrition currently feedings via NG tube, request for G-tube placement.   Past Medical History:  Past Medical History  Diagnosis Date  . CAD (coronary artery disease)     a. Nonobst by cath 11/11: LAD 40%, mid-dist 25-30%; prox CFX 30%, mid 40%; OM 50%; PDA 50%; PLV 50%;  EF 65%.  . NSTEMI (non-ST elevated myocardial infarction)     a. In setting of AFib with RVR 03/2010, type 2. b. Again in 11/2011 felt 2/2 afib.  . Atrial fibrillation     a. DCCV 11/17/11. b. On coumadin & amiodarone.  Marland Kitchen HOCM (hypertrophic obstructive cardiomyopathy)     a. Echo 11/2011:severe LVH, EF 65-70%, mid-cavity gradient up to . b. Echo 06/2012: EF 65-70%, severe LVH, grade 2 d/dysf.  Marland Kitchen Hypertension   . Tobacco abuse   . CKD (chronic kidney disease), stage IV   . Diastolic CHF     preserved LVF  . Iron deficiency anemia   . Third degree uterine prolapse   . Hx MRSA infection   . Hx of cardiovascular stress test     a. Lex MV 12/13:  EF 56%, no ischemia    . Diabetes   . Hyperlipidemia   . Respiratory failure     Multiple admissions for resp failure requiring intubation.  . Hypercalcemia   . LBBB (left bundle branch block)     History of transient LBBB  . C. difficile colitis 01/2012    Past Surgical History:  Past Surgical History  Procedure Laterality Date  . Tubal ligation    . Cardioversion  11/17/2011    Procedure: CARDIOVERSION;  Surgeon: Hillis Range, MD;  Location: [facility]  OR;  Service: Cardiovascular;  Laterality: N/A;    Family History:  Family History  Problem Relation Age of Onset  . Coronary artery disease Neg Hx   . Atrial fibrillation Neg Hx   . Diabetes Daughter     Social History:  reports that she quit smoking about 9 months ago. Her smoking use included Cigarettes. She has a 7.5 pack-year smoking history. She has never used smokeless tobacco. She reports that she does not drink alcohol or use illicit drugs.  Allergies: No Known Allergies    Medication List    ASK your doctor about these medications       acetaminophen 500 MG tablet  Commonly known as:  TYLENOL  Take 500 mg by mouth 2 (two) times daily as needed for pain.     amiodarone 200 MG tablet  Commonly known as:  PACERONE  Take 1 tablet (200 mg total) by mouth daily.     amLODipine 5 MG tablet  Commonly known as:  NORVASC  Take 5 mg by mouth daily.     furosemide 40 MG tablet  Commonly known as:  LASIX  Take 1 tablet (40 mg total) by mouth daily.     metoprolol tartrate 25 MG tablet  Commonly known as:  LOPRESSOR  Take 25 mg by mouth 2 (two) times daily.     nitroGLYCERIN  0.4 MG SL tablet  Commonly known as:  NITROSTAT  Place 0.4 mg under the tongue every 5 (five) minutes as needed for chest pain.     pantoprazole 40 MG tablet  Commonly known as:  PROTONIX  Take 1 tablet (40 mg total) by mouth daily at 12 noon.     rosuvastatin 10 MG tablet  Commonly known as:  CRESTOR  Take 10 mg by mouth at bedtime.     warfarin 5 MG tablet  Commonly known as:  COUMADIN  Take 2.5-5 mg by mouth at bedtime. The patient takes 1/2 tablet (2.5 mg) daily EXCEPT for 1 tablet (5 mg) on Mondays only.        Please HPI for pertinent positives, otherwise complete 10 system ROS negative.  Physical Exam: BP 107/70  Pulse 124  Temp(Src) 96.8 F (36 C) (Axillary)  Resp 24  Ht 5\' 2"  (1.575 m)  Wt 186 lb 11.7 oz (84.7 kg)  BMI 34.14 kg/m2  SpO2 96% Body mass index is 34.14  kg/(m^2).   General Appearance:  Sedated, cooperative, no distress, appears stated age  Head:  Normocephalic, without obvious abnormality, atraumatic  Lungs:   Clear to auscultation bilaterally, no w/r/r, respirations unlabored without use of accessory muscles.  Chest Wall:  No tenderness or deformity  Heart:  Irregularly irregular rate and rhythm, S1, S2 normal, no murmur, rub or gallop.  Abdomen:   Soft, non-tender, non distended.  Extremities: Extremities normal, atraumatic, no cyanosis or edema   Results for orders placed during the hospital encounter of 11/09/12 (from the past 48 hour(s))  GLUCOSE, CAPILLARY     Status: Abnormal   Collection Time    11/25/12  8:57 PM      Result Value Range   Glucose-Capillary 196 (*) 70 - 99 mg/dL  GLUCOSE, CAPILLARY     Status: Abnormal   Collection Time    11/25/12 11:15 PM      Result Value Range   Glucose-Capillary 163 (*) 70 - 99 mg/dL  GLUCOSE, CAPILLARY     Status: Abnormal   Collection Time    11/26/12  3:23 AM      Result Value Range   Glucose-Capillary 141 (*) 70 - 99 mg/dL  PROTIME-INR     Status: Abnormal   Collection Time    11/26/12  4:40 AM      Result Value Range   Prothrombin Time 19.1 (*) 11.6 - 15.2 seconds   INR 1.66 (*) 0.00 - 1.49  CBC     Status: Abnormal   Collection Time    11/26/12  4:40 AM      Result Value Range   WBC 19.7 (*) 4.0 - 10.5 K/uL   RBC 3.03 (*) 3.87 - 5.11 MIL/uL   Hemoglobin 8.1 (*) 12.0 - 15.0 g/dL   HCT 40.9 (*) 81.1 - 91.4 %   MCV 80.9  78.0 - 100.0 fL   MCH 26.7  26.0 - 34.0 pg   MCHC 33.1  30.0 - 36.0 g/dL   RDW 78.2 (*) 95.6 - 21.3 %   Platelets 159  150 - 400 K/uL  HEPARIN LEVEL (UNFRACTIONATED)     Status: None   Collection Time    11/26/12  4:40 AM      Result Value Range   Heparin Unfractionated 0.30  0.30 - 0.70 IU/mL   Comment:            IF HEPARIN RESULTS ARE BELOW     EXPECTED VALUES,  AND PATIENT     DOSAGE HAS BEEN CONFIRMED,     SUGGEST FOLLOW UP TESTING     OF  ANTITHROMBIN III LEVELS.  BASIC METABOLIC PANEL     Status: Abnormal   Collection Time    11/26/12  4:40 AM      Result Value Range   Sodium 148 (*) 135 - 145 mEq/L   Potassium 3.2 (*) 3.5 - 5.1 mEq/L   Chloride 116 (*) 96 - 112 mEq/L   CO2 22  19 - 32 mEq/L   Glucose, Bld 154 (*) 70 - 99 mg/dL   BUN 41 (*) 6 - 23 mg/dL   Creatinine, Ser 1.61 (*) 0.50 - 1.10 mg/dL   Calcium 09.6 (*) 8.4 - 10.5 mg/dL   GFR calc non Af Amer 20 (*) >90 mL/min   GFR calc Af Amer 23 (*) >90 mL/min   Comment:            The eGFR has been calculated     using the CKD EPI equation.     This calculation has not been     validated in all clinical     situations.     eGFR's persistently     <90 mL/min signify     possible Chronic Kidney Disease.  MAGNESIUM     Status: None   Collection Time    11/26/12  4:40 AM      Result Value Range   Magnesium 2.0  1.5 - 2.5 mg/dL  PHOSPHORUS     Status: None   Collection Time    11/26/12  4:40 AM      Result Value Range   Phosphorus 3.3  2.3 - 4.6 mg/dL  GLUCOSE, CAPILLARY     Status: Abnormal   Collection Time    11/26/12  7:54 AM      Result Value Range   Glucose-Capillary 161 (*) 70 - 99 mg/dL  GLUCOSE, CAPILLARY     Status: Abnormal   Collection Time    11/26/12 12:33 PM      Result Value Range   Glucose-Capillary 176 (*) 70 - 99 mg/dL  GLUCOSE, CAPILLARY     Status: Abnormal   Collection Time    11/26/12  4:34 PM      Result Value Range   Glucose-Capillary 184 (*) 70 - 99 mg/dL  GLUCOSE, CAPILLARY     Status: Abnormal   Collection Time    11/26/12  7:53 PM      Result Value Range   Glucose-Capillary 176 (*) 70 - 99 mg/dL  GLUCOSE, CAPILLARY     Status: Abnormal   Collection Time    11/26/12 11:40 PM      Result Value Range   Glucose-Capillary 172 (*) 70 - 99 mg/dL  GLUCOSE, CAPILLARY     Status: Abnormal   Collection Time    11/27/12  4:04 AM      Result Value Range   Glucose-Capillary 185 (*) 70 - 99 mg/dL  PROTIME-INR     Status:  Abnormal   Collection Time    11/27/12  4:30 AM      Result Value Range   Prothrombin Time 17.0 (*) 11.6 - 15.2 seconds   INR 1.42  0.00 - 1.49  CBC     Status: Abnormal   Collection Time    11/27/12  4:30 AM      Result Value Range   WBC 28.2 (*) 4.0 - 10.5 K/uL   Comment: WHITE  COUNT CONFIRMED ON SMEAR   RBC 3.21 (*) 3.87 - 5.11 MIL/uL   Hemoglobin 8.6 (*) 12.0 - 15.0 g/dL   HCT 16.1 (*) 09.6 - 04.5 %   MCV 81.6  78.0 - 100.0 fL   MCH 26.8  26.0 - 34.0 pg   MCHC 32.8  30.0 - 36.0 g/dL   RDW 40.9 (*) 81.1 - 91.4 %   Platelets 188  150 - 400 K/uL   Comment: PLATELET COUNT CONFIRMED BY SMEAR  HEPARIN LEVEL (UNFRACTIONATED)     Status: None   Collection Time    11/27/12  4:30 AM      Result Value Range   Heparin Unfractionated 0.35  0.30 - 0.70 IU/mL   Comment:            IF HEPARIN RESULTS ARE BELOW     EXPECTED VALUES, AND PATIENT     DOSAGE HAS BEEN CONFIRMED,     SUGGEST FOLLOW UP TESTING     OF ANTITHROMBIN III LEVELS.  BASIC METABOLIC PANEL     Status: Abnormal   Collection Time    11/27/12  4:30 AM      Result Value Range   Sodium 147 (*) 135 - 145 mEq/L   Potassium 4.8  3.5 - 5.1 mEq/L   Comment: DELTA CHECK NOTED     NO VISIBLE HEMOLYSIS   Chloride 117 (*) 96 - 112 mEq/L   CO2 22  19 - 32 mEq/L   Glucose, Bld 205 (*) 70 - 99 mg/dL   BUN 49 (*) 6 - 23 mg/dL   Creatinine, Ser 7.82 (*) 0.50 - 1.10 mg/dL   Calcium 95.6 (*) 8.4 - 10.5 mg/dL   Comment: CRITICAL RESULT CALLED TO, READ BACK BY AND VERIFIED WITH:     Maricela Bo [id number] WILDERK   GFR calc non Af Amer 19 (*) >90 mL/min   GFR calc Af Amer 22 (*) >90 mL/min   Comment:            The eGFR has been calculated     using the CKD EPI equation.     This calculation has not been     validated in all clinical     situations.     eGFR's persistently     <90 mL/min signify     possible Chronic Kidney Disease.  MAGNESIUM     Status: None   Collection Time    11/27/12  4:30 AM      Result Value Range    Magnesium 2.2  1.5 - 2.5 mg/dL  PHOSPHORUS     Status: None   Collection Time    11/27/12  4:30 AM      Result Value Range   Phosphorus 3.2  2.3 - 4.6 mg/dL  GLUCOSE, CAPILLARY     Status: Abnormal   Collection Time    11/27/12  7:49 AM      Result Value Range   Glucose-Capillary 229 (*) 70 - 99 mg/dL  GLUCOSE, CAPILLARY     Status: Abnormal   Collection Time    11/27/12 11:46 AM      Result Value Range   Glucose-Capillary 188 (*) 70 - 99 mg/dL   No results found.  Assessment/Plan Acute respiratory failure secondary to acute on chronic CHF exacerbation, patient is on ventilator. Pulmonary infiltrates, patient is on IV antibiotics. Blood cultures no growth/pending. Urine and sputum Cx (+) yeast. Atrial fibrillation RVR on digoxin and IV heparin. Shock, on IV pressors  and solu-cortef. Malnutrition, NG tube in place. Request for percutaneous gastric tube placement. CBC and INR in am, hold tube feedings after midnight. Hold heparin gtt 2 hours prior to procedure on 7/22. Barium via NG tube, KUB in am 7/22. Procedure, risks and benefits d/w family over phone, they will sign consent when visiting tonight and agree to proceed.   Pattricia Boss D PA-C 11/27/2012, 4:47 PM

## 2012-11-27 NOTE — Progress Notes (Signed)
eLink Physician-Brief Progress Note Patient Name: [name] DOB: [birth year] MRN: [id number]  Date of Service  11/27/2012   HPI/Events of Note  Patient with hypercalcemia - corrected calcium of 14.8 in the setting of renal insufficiency, immobility and recent blood transfusions.   eICU Interventions  Plan: Check intact PTH Consider additional IVFs May require pamidronate   Intervention Category Major Interventions: Electrolyte abnormality - evaluation and management  Ichiro Chesnut 11/27/2012, 6:32 AM

## 2012-11-28 ENCOUNTER — Inpatient Hospital Stay (HOSPITAL_COMMUNITY): Payer: Medicare Other

## 2012-11-28 LAB — BASIC METABOLIC PANEL
BUN: 48 mg/dL — ABNORMAL HIGH (ref 6–23)
CO2: 18 mEq/L — ABNORMAL LOW (ref 19–32)
GFR calc non Af Amer: 24 mL/min — ABNORMAL LOW (ref >90)
Glucose, Bld: 152 mg/dL — ABNORMAL HIGH (ref 70–99)
Potassium: 4.9 mEq/L (ref 3.5–5.1)

## 2012-11-28 LAB — CBC WITH DIFFERENTIAL/PLATELET
Basophils Absolute: 0 10*3/uL (ref 0.0–0.1)
Eosinophils Absolute: 0.2 10*3/uL (ref 0.0–0.7)
Hemoglobin: 9.2 g/dL — ABNORMAL LOW (ref 12.0–15.0)
Lymphocytes Relative: 3 % — ABNORMAL LOW (ref 12–46)
MCHC: 32.1 g/dL (ref 30.0–36.0)
Monocytes Absolute: 0.9 10*3/uL (ref 0.1–1.0)
Neutrophils Relative %: 92 % — ABNORMAL HIGH (ref 43–77)
Platelets: 142 10*3/uL — ABNORMAL LOW (ref 150–400)
RDW: 19.2 % — ABNORMAL HIGH (ref 11.5–15.5)

## 2012-11-28 LAB — GLUCOSE, CAPILLARY
Glucose-Capillary: 112 mg/dL — ABNORMAL HIGH (ref 70–99)
Glucose-Capillary: 143 mg/dL — ABNORMAL HIGH (ref 70–99)
Glucose-Capillary: 221 mg/dL — ABNORMAL HIGH (ref 70–99)
Glucose-Capillary: 151 mg/dL — ABNORMAL HIGH (ref 70–99)
Glucose-Capillary: 141 mg/dL — ABNORMAL HIGH (ref 70–99)
Glucose-Capillary: 186 mg/dL — ABNORMAL HIGH (ref 70–99)

## 2012-11-28 LAB — PROTIME-INR: Prothrombin Time: 14.7 seconds (ref 11.6–15.2)

## 2012-11-28 LAB — CULTURE, BLOOD (ROUTINE X 2): Culture: NO GROWTH

## 2012-11-28 MED ORDER — IOHEXOL 300 MG/ML  SOLN
50.0000 mL | Freq: Once | INTRAMUSCULAR | Status: AC | PRN
  Administered 2012-11-28: 20 mL via INTRAVENOUS

## 2012-11-28 MED ORDER — HEPARIN (PORCINE) IN NACL 100-0.45 UNIT/ML-% IJ SOLN
1050.0000 [IU]/h | INTRAMUSCULAR | Status: DC
  Filled 2012-11-28: qty 250

## 2012-11-28 MED ORDER — FENTANYL CITRATE 0.05 MG/ML IJ SOLN
INTRAMUSCULAR | Status: DC | PRN
  Administered 2012-11-28: 25 ug via INTRAVENOUS

## 2012-11-28 MED ORDER — GLUCAGON HCL (RDNA) 1 MG IJ SOLR
INTRAMUSCULAR | Status: AC
  Filled 2012-11-28: qty 1

## 2012-11-28 MED ORDER — HEPARIN (PORCINE) IN NACL 100-0.45 UNIT/ML-% IJ SOLN: 1050.0000 [IU]/h | INTRAMUSCULAR | Status: DC

## 2012-11-28 MED ORDER — MIDAZOLAM HCL 2 MG/2ML IJ SOLN
INTRAMUSCULAR | Status: DC | PRN
  Administered 2012-11-28 – 2012-11-30 (×2): 2 mg via INTRAVENOUS

## 2012-11-28 NOTE — Progress Notes (Signed)
ANTIBIOTIC CONSULT NOTE - FOLLOW UP  Pharmacy Consult for Cefepime Indication: pneumonia  No Known Allergies  Patient Measurements: Height: 5\' 2"  (157.5 cm) Weight: 191 lb 5.8 oz (86.8 kg) IBW/kg (Calculated) : 50.1  Vital Signs: Temp: 98.4 F (36.9 C) (07/22 1143) Temp src: Oral (07/22 1143) BP: 103/57 mmHg (07/22 1300) Pulse Rate: 73 (07/22 1300) Intake/Output from previous day: 07/21 0701 - 07/22 0700 In: 3623.3 [I.V.:2663.3; NG/GT:910; IV Piggyback:50] Out: 1745 [Urine:1745] Intake/Output from this shift: Total I/O In: 660.5 [I.V.:610.5; IV Piggyback:50] Out: 350 [Urine:350]  Labs:  Recent Labs  11/25/12 1555 11/26/12 0440 11/27/12 0430 11/28/12 0900  WBC  --  19.7* 28.2* 22.8*  HGB  --  8.1* 8.6* 9.2*  PLT  --  159 188 142*  CREATININE 1.98* 2.33* 2.45*  --    Estimated Creatinine Clearance: 21.2 ml/min (by C-G formula based on Cr of 2.45).  Assessment: 72yof continues on cefepime day #7 for pneumonia. Renal function is worsening but dose still ok. She has remained afebrile however her WBC is increasing. Noted plan per CCM note to continue cefepime through 7/24.  Vanc 7/4 >>7/6, 7/8>> 7/18 Unasyn 7/7 (aspiration)>> 7/16 Cefepime 7/16 >> through 7/24  7/4 blood x2 - neg 7/3 urine - neg 7/8 resp - candida 7/16 blood x2- ngtd 7/16 urine - yeast 7/17 resp - ngtd  Goal of Therapy:  Appropriate cefepime dosing  Plan:  1) Continue cefepime 2g IV q24 2) Follow up stop date 7/24  Fredrik Rigger 11/28/2012,1:36 PM

## 2012-11-28 NOTE — Progress Notes (Signed)
   SUBJECTIVE:  Trached.  Somnolent this AM.     PHYSICAL EXAM Filed Vitals:   11/28/12 0600 11/28/12 0700 11/28/12 0743 11/28/12 0800  BP: 136/86 145/81  144/108  Pulse: 76  84   Temp:    97.9 F (36.6 C)  TempSrc:    Oral  Resp: 17 17 25 22   Height:      Weight:      SpO2: 99%  100% 100%   General:  No distress Lungs:  Clear Heart:  Irregular Abdomen:  Positive bowel sounds, no rebound no guarding Extremities:  No edema, nonfocal   LABS:  Results for orders placed during the hospital encounter of 11/09/12 (from the past 24 hour(s))  GLUCOSE, CAPILLARY     Status: Abnormal   Collection Time    11/27/12 11:46 AM      Result Value Range   Glucose-Capillary 188 (*) 70 - 99 mg/dL  GLUCOSE, CAPILLARY     Status: Abnormal   Collection Time    11/27/12  5:13 PM      Result Value Range   Glucose-Capillary 166 (*) 70 - 99 mg/dL  GLUCOSE, CAPILLARY     Status: Abnormal   Collection Time    11/27/12  7:28 PM      Result Value Range   Glucose-Capillary 189 (*) 70 - 99 mg/dL  GLUCOSE, CAPILLARY     Status: Abnormal   Collection Time    11/28/12 12:04 AM      Result Value Range   Glucose-Capillary 221 (*) 70 - 99 mg/dL  GLUCOSE, CAPILLARY     Status: Abnormal   Collection Time    11/28/12  4:35 AM      Result Value Range   Glucose-Capillary 143 (*) 70 - 99 mg/dL  GLUCOSE, CAPILLARY     Status: Abnormal   Collection Time    11/28/12  7:54 AM      Result Value Range   Glucose-Capillary 112 (*) 70 - 99 mg/dL    Intake/Output Summary (Last 24 hours) at 11/28/12 0825 Last data filed at 11/28/12 0800  Gross per 24 hour  Intake 3592.01 ml  Output   1870 ml  Net 1722.01 ml    ASSESSMENT AND PLAN:  Acute on chronic diastolic congestive heart failure:  Treatment complicated by Foley issues.   Lasix held as creat bumped with diuresis as well as hypotension.  She was started back on IV fluid yesterday for these reasons and for hypercalcemia.   CVP 10.  CHRONIC KIDNEY  DISEASE STAGE V:  Creat bumped with diuresis.  As above.   FIBRILLATION, ATRIAL:  Off of amiodarone secondary to low HR.   Continue rate control and anticoagulation for now.  No plan for cardioversion at this time as she was off warfarin for trach.    Acute respiratory failure with hypoxia:  Status post trach.     Fayrene Fearing Shavonn Convey 11/28/2012 8:25 AM

## 2012-11-28 NOTE — Progress Notes (Signed)
PULMONARY  / CRITICAL CARE MEDICINE  Name: [name] MRN: [id number] DOB: 02-Apr-1941    ADMISSION DATE:  11/09/2012 CONSULTATION DATE:  11/09/2012  REFERRING MD :  Preston Fleeting PRIMARY SERVICE: PCCM  CHIEF COMPLAINT:  Shortness of breath.  BRIEF PATIENT DESCRIPTION: 72 y/o female with CHF was admitted on 7/3 from the [facility] ED with acute hypoxemic respiratory failure due to a CHF exacerbation, ?Superimposed PNA.  LINES / TUBES: 7/3 ETT >>11/13/12, 11/13/12  (failed extubation immediately, ? aspirated) >> 7/17 7/4 CVC R IJ >> 7/17 7/5 A-line >> 7/18 7/17 Trach (DF) >> 7/17 Rt PICC >>   CULTURES: 7/4 U strep >> neg 7/4 legionella >> neg 7/3 mrsa PCR >> POSITIVE 7/8 mini-BAL >> yeast 7/16 sputum >>  7/16 blood >> 7/16 urine >> yeast 50 K colonies  ANTIBIOTICS: 7/3 Ceftriaxone >>7/6 7/4 levofloxacin >>7/7 7/4 Vancomycin >>7/6, restarted 7/8 >> 7/18 7/7 Unasyn >> 7/16 7/16 cefepime >>   SIGNIFICANT EVENTS: 7/3 admission 7/07 reintubated immediately after extubation.  ? Aspiration. ABX restarted.  on Neo-Synephrine.  7/09 Able to wean fio2 and neo. Lopressor required for rate control of afib. 7/10 Off neo, but now tachycardic.  Failed SBT this AM, but RT felt it was due to sedation.  Sedation weaned 7/14 Hypotensive, vomiting, amiodarone turned off for low heart rate 7/16 Change to pressure control 7/17 Start pressors, PRBC transfusion, trach 7/18 Off pressors, PRBC transfusion  STUDIES: 7/16 CT chest >> emphysema, b/l ASD, small effusions 7/16 CT abd/pelvis >> 2.6 x 2 cm nodule in head of pancreas, bone changes suggestive of Paget's disease  SUBJECTIVE: Off pressors.  Severe tachycardia and desaturation this AM requiring replacement back on vent.  VITAL SIGNS: Temp:  [96.5 F (35.8 C)-98.5 F (36.9 C)] 97.9 F (36.6 C) (07/22 0800) Pulse Rate:  [46-156] 92 (07/22 1000) Resp:  [15-33] 18 (07/22 1000) BP: (73-148)/(46-108) 117/76 mmHg (07/22 1000) SpO2:  [75 %-100 %] 100 %  (07/22 1000) FiO2 (%):  [40 %] 40 % (07/22 1000) Weight:  [86.8 kg (191 lb 5.8 oz)] 86.8 kg (191 lb 5.8 oz) (07/22 0443)  HEMODYNAMICS: CVP:  [10 mmHg-13 mmHg] 10 mmHg  VENTILATOR SETTINGS: Vent Mode:  [-] CPAP;PSV FiO2 (%):  [40 %] 40 % Set Rate:  [16 bmp] 16 bmp PEEP:  [5 cmH20] 5 cmH20 Pressure Support:  [10 cmH20-14 cmH20] 10 cmH20 Plateau Pressure:  [17 cmH20-24 cmH20] 17 cmH20  INTAKE / OUTPUT: Intake/Output     07/21 0701 - 07/22 0700 07/22 0701 - 07/23 0700   I.V. (mL/kg) 2663.3 (30.7) 310.5 (3.6)   NG/GT 910    IV Piggyback 50    Total Intake(mL/kg) 3623.3 (41.7) 310.5 (3.6)   Urine (mL/kg/hr) 1745 (0.8) 125 (0.4)   Total Output 1745 125   Net +1878.3 +185.5         PHYSICAL EXAMINATION: Gen: No distress, much calmer this AM. HEENT: NG tube in place, trach site clean PULM: scattered rhonchi CV: irregular AB: soft, non tender GU: uterine prolapse Ext: no edema Derm: no rash Neuro: RASS 0, moves extremities, follows simple commands  LABS: CBC Recent Labs     11/26/12  0440  11/27/12  0430  WBC  19.7*  28.2*  HGB  8.1*  8.6*  HCT  24.5*  26.2*  PLT  159  188   Coag's Recent Labs     11/26/12  0440  11/27/12  0430  INR  1.66*  1.42   BMET Recent Labs  11/25/12  1555  11/26/12  0440  11/27/12  0430  NA  143  148*  147*  K  3.4*  3.2*  4.8  CL  112  116*  117*  CO2  23  22  22   BUN  36*  41*  49*  CREATININE  1.98*  2.33*  2.45*  GLUCOSE  150*  154*  205*   Electrolytes Recent Labs     11/25/12  1555  11/26/12  0440  11/27/12  0430  CALCIUM  11.6*  12.5*  13.5*  MG   --   2.0  2.2  PHOS   --   3.3  3.2   Glucose Recent Labs     11/27/12  1146  11/27/12  1713  11/27/12  1928  11/28/12  0004  11/28/12  0435  11/28/12  0754  GLUCAP  188*  166*  189*  221*  143*  112*    Imaging Dg Abd Portable 1v  11/28/2012   *RADIOLOGY REPORT*  Clinical Data: Evaluate barium prior to potential percutaneous gastrostomy tube placement   PORTABLE ABDOMEN - 1 VIEW  Comparison: CT abdomen pelvis - 11/22/2012; chest radiograph - 11/25/2012  Findings:  Enteric contrast is seen throughout the colon.  No evidence of enteric obstruction.  No supine evidence of pneumoperitoneum.  No definite pneumatosis or portal venous gas. An enteric tube tip and side port project over the expected location of the gastric antrum.  Limited visualization of the lower thorax demonstrates enlarged cardiac silhouette.  Grossly unchanged bones.  IMPRESSION: Enteric contrast seen throughout the colon.  No evidence of obstruction.   Original Report Authenticated By: Tacey Ruiz, MD   ASSESSMENT / PLAN:  PULMONARY A: Acute respiratory failure 2nd to pulmonary infiltrates >> ? Cause. Failure to wean from ventilator >> s/p trach 7/17. P:   - Back on vent, unable to tolerate PS trials, maintain on vent for now, I highly doubt quick liberation from the ventilation, will require LTAC for prolonged wean. - F/u CXR. - PRN BD's. - Renal function precluding any further diureses at this time, will continue lasix for now however.  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 A fib with RVR >> amiodarone d/c due to bradycardia, hypotension. Hx of CAD, HTN, hyperlipidemia. Shock >> requiring levophed overnight P:  - Hold lipitor for now. - Continue lasix 40 mg IV BID. - Solu cortef added 7/15 for ?relative adrenal insufficiency, will maintain while BP is marginal and on pressors for now.  RENAL A: Acute kidney injury >> renal fx stable. Hypernatremia >> improved Hypokalemia. Hypercalcemia >> PTH 678.6 from 7/16. P:   - KVO IV fluids. - Monitor renal fx, urine outpt. - F/U electrolytes - Lasix 40 mg IV BID. - Replace electrolytes as needed.  GASTROINTESTINAL A:  Protein malnutrition. Vomiting 7/14 >> improved. Mass head of pancreas. Spoke with family, ok with PEG placement P:   - Continue TF to goal but hold for PEG placement  when IR is ready. - Pepcid for SUP. - Will need further assessment of pancreatic lesion when more stable. - Consult IR for PEG placement appreciated.  GYN A: Uterine prolapse. P: - GYN consult called on Friday, prolapse per GYN.  HEMATOLOGIC A: Anemia of chronic disease and critical illness >> PRBC transfusion 7/17, 7/18. Elevated WBC likely due to steroids and stress, no signs of active infection at this time. Chronic coumadin therapy for a fib. P:  - F/u CBC. -  Transfuse for Hb < 7. - Continue heparin gtt until more stable to start coumadin.  INFECTIOUS A: Initial concern for aspiration pneumonia >> has persistent pulmonary infiltrates, low grade temperature, and increasing WBC. P:   - D19/21 of Abx >> continue cefepime, will d/c 7/24 dose. - D/Ced vancomycin 7/18.  ENDOCRINE A:  DM2 P:   - SSI. - D/Ced lantus until sure she can tolerate enteral feedings.  NEUROLOGIC A: Acute encephalopathy 2nd to hypoxia/hypercapnia. Muscular deconditioning. P:   - Continue fentanyl patch 100 mcg q72. - Klonipin for anxiety 1 mg BID. - Will need PT/OT when more stable but hold for today.  Will be ready for placement after PEG is placed by IR,  [facility] for both [facility].  CC time 35 minutes.  Alyson Reedy, M.D. [facility] Pulmonary/Critical Care Medicine. Pager: [phone]. After hours pager: [phone].

## 2012-11-28 NOTE — Progress Notes (Signed)
ANTICOAGULATION CONSULT NOTE - Follow Up Consult  Pharmacy Consult for Heparin Indication: atrial fibrillation  No Known Allergies  Patient Measurements: Height: 5\' 2"  (157.5 cm) Weight: 191 lb 5.8 oz (86.8 kg) IBW/kg (Calculated) : 50.1 Heparin Dosing Weight: 68kg  Vital Signs: Temp: 99.3 F (37.4 C) (07/22 2000) Temp src: Oral (07/22 2000) BP: 99/75 mmHg (07/22 2100) Pulse Rate: 80 (07/22 2100)  Labs:  Recent Labs  11/26/12 0440 11/27/12 0430 11/28/12 0900 11/28/12 1733  HGB 8.1* 8.6* 9.2*  --   HCT 24.5* 26.2* 28.7*  --   PLT 159 188 142*  --   LABPROT 19.1* 17.0* 14.7  --   INR 1.66* 1.42 1.17  --   HEPARINUNFRC 0.30 0.35 <0.10*  --   CREATININE 2.33* 2.45*  --  1.96*    Estimated Creatinine Clearance: 26.5 ml/min (by C-G formula based on Cr of 1.96).   Medications:  Heparin drip stopped this AM prior to G-tube placement on 11/28/12.  Assessment: S/p PEG tube placement this afternoon. RN received order to resume heparin drip at midnight. This is a  72yo female with hx of Afib on chronic warfarin therapy. On heparin bridge while coumadin held. Heparin level this AM was  <0.1 but this level reported to have been drawn after the heparin drip was stopped for the PEG placement. Previously therapeutic heparin levels 7/20 and 7/21 on same rate 1050 units/hr IV heparin.   INR is 1.17 (has been 1.42-2.59 since 7/14 and last dose of coumadin was 7/14). Hgb is low but stable at 9.2 , platelets  142K.  Of note, patient had bloody respiratory sections as well as hematuria on 7/17. Noted with PRBC transfusion on 7/17, 7/18 (also 4 units FFP on 7/17). No further bleeding noted.  Goal of Therapy:  Heparin level 0.3-0.7 units/ml (targeting lower end of range due to anemia and bleeding) Monitor platelets by anticoagulation protocol: Yes   Plan:  1) To resume IV Heparin drip at previous rate of 1050 units/hr at MN tonight. 2) Follow up heparin level and CBC in AM  Noah Delaine, RPh Clinical Pharmacist Pager: [phone] 11/28/2012,9:55 PM

## 2012-11-28 NOTE — Progress Notes (Signed)
CRITICAL VALUE ALERT  Critical value received:  Calcium 13.1  Date of notification: 11/28/2012  Time of notification:  6:43 PM   Critical value read back:yes  Nurse who received alert:  Legrand Como RN  MD notified (1st page):  Dr Bard Herbert  Time of first page:  [phone]  MD notified (2nd page):  Time of second page:  Responding MD:  Dr Bard Herbert  Time MD responded:  [phone]

## 2012-11-28 NOTE — Procedures (Signed)
Successful fluoroscopic guided insertion of gastrostomy tube without immediate post procedural complicatoin.   The gastrostomy tube may be used immediately for medications.  Otherwise, place gastrostomy tube to low wall suction for 24 hrs.  Tube feeds may be initiated in 24 hours as per the primary team.   

## 2012-11-29 LAB — CBC
Platelets: 141 10*3/uL — ABNORMAL LOW (ref 150–400)
RDW: 18.9 % — ABNORMAL HIGH (ref 11.5–15.5)
WBC: 14.4 10*3/uL — ABNORMAL HIGH (ref 4.0–10.5)

## 2012-11-29 LAB — PHOSPHORUS: Phosphorus: 3.3 mg/dL (ref 2.3–4.6)

## 2012-11-29 LAB — PROTIME-INR
INR: 1.43 (ref 0.00–1.49)
Prothrombin Time: 17.1 seconds — ABNORMAL HIGH (ref 11.6–15.2)

## 2012-11-29 LAB — BASIC METABOLIC PANEL
GFR calc Af Amer: 29 mL/min — ABNORMAL LOW (ref >90)
GFR calc non Af Amer: 25 mL/min — ABNORMAL LOW (ref >90)
Potassium: 4.4 mEq/L (ref 3.5–5.1)
Sodium: 153 mEq/L — ABNORMAL HIGH (ref 135–145)

## 2012-11-29 LAB — GLUCOSE, CAPILLARY
Glucose-Capillary: 67 mg/dL — ABNORMAL LOW (ref 70–99)
Glucose-Capillary: 154 mg/dL — ABNORMAL HIGH (ref 70–99)
Glucose-Capillary: 237 mg/dL — ABNORMAL HIGH (ref 70–99)

## 2012-11-29 MED ORDER — WARFARIN SODIUM 5 MG PO TABS: 5.0000 mg | ORAL_TABLET | ORAL | Status: DC

## 2012-11-29 MED ORDER — HEPARIN (PORCINE) IN NACL 100-0.45 UNIT/ML-% IJ SOLN
1000.0000 [IU]/h | INTRAMUSCULAR | Status: DC
  Administered 2012-11-29 (×2): 1100 [IU]/h via INTRAVENOUS
  Filled 2012-11-29: qty 250

## 2012-11-29 MED ORDER — FUROSEMIDE 10 MG/ML IJ SOLN
40.0000 mg | Freq: Four times a day (QID) | INTRAMUSCULAR | Status: AC
  Administered 2012-11-29 (×3): 40 mg via INTRAVENOUS
  Filled 2012-11-29: qty 4

## 2012-11-29 MED ORDER — WARFARIN SODIUM 2.5 MG PO TABS
2.5000 mg | ORAL_TABLET | ORAL | Status: DC
  Administered 2012-11-29: 2.5 mg via ORAL
  Filled 2012-11-29 (×2): qty 1

## 2012-11-29 MED ORDER — DEXTROSE 5 % IV SOLN
INTRAVENOUS | Status: AC
  Administered 2012-11-29: 50 mL via INTRAVENOUS

## 2012-11-29 MED ORDER — WARFARIN - PHARMACIST DOSING INPATIENT
Freq: Every day | Status: DC
  Administered 2012-11-29 – 2012-12-20 (×4)

## 2012-11-29 MED ORDER — FREE WATER
300.0000 mL | Freq: Four times a day (QID) | Status: DC
  Administered 2012-11-29 – 2012-12-09 (×39): 300 mL

## 2012-11-29 MED ORDER — HYDROCORTISONE SOD SUCCINATE 100 MG IJ SOLR
25.0000 mg | Freq: Two times a day (BID) | INTRAMUSCULAR | Status: DC
  Administered 2012-11-29 – 2012-12-22 (×46): 25 mg via INTRAVENOUS
  Filled 2012-11-29 (×51): qty 0.5

## 2012-11-29 NOTE — Progress Notes (Signed)
PULMONARY  / CRITICAL CARE MEDICINE  Name: [name] MRN: [id number] DOB: [birth year]    ADMISSION DATE:  11/09/2012 CONSULTATION DATE:  11/09/2012  REFERRING MD :  Preston Fleeting PRIMARY SERVICE: PCCM  CHIEF COMPLAINT:  Shortness of breath.  BRIEF PATIENT DESCRIPTION: 72 y/o female with CHF was admitted on 7/3 from the [facility] ED with acute hypoxemic respiratory failure due to a CHF exacerbation, ?Superimposed PNA.  LINES / TUBES: 7/3 ETT >>11/13/12, 11/13/12  (failed extubation immediately, ? aspirated) >> 7/17 7/4 CVC R IJ >> 7/17 7/5 A-line >> 7/18 7/17 Trach (DF) >> 7/17 Rt PICC >>   CULTURES: 7/4 U strep >> neg 7/4 legionella >> neg 7/3 mrsa PCR >> POSITIVE 7/8 mini-BAL >> yeast 7/16 sputum >>  7/16 blood >> 7/16 urine >> yeast 50 K colonies  ANTIBIOTICS: 7/3 Ceftriaxone >>7/6 7/4 levofloxacin >>7/7 7/4 Vancomycin >>7/6, restarted 7/8 >> 7/18 7/7 Unasyn >> 7/16 7/16 cefepime >>   SIGNIFICANT EVENTS: 7/3 admission 7/07 reintubated immediately after extubation.  ? Aspiration. ABX restarted.  on Neo-Synephrine.  7/09 Able to wean fio2 and neo. Lopressor required for rate control of afib. 7/10 Off neo, but now tachycardic.  Failed SBT this AM, but RT felt it was due to sedation.  Sedation weaned 7/14 Hypotensive, vomiting, amiodarone turned off for low heart rate 7/16 Change to pressure control 7/17 Start pressors, PRBC transfusion, trach 7/18 Off pressors, PRBC transfusion  STUDIES: 7/16 CT chest >> emphysema, b/l ASD, small effusions 7/16 CT abd/pelvis >> 2.6 x 2 cm nodule in head of pancreas, bone changes suggestive of Paget's disease  SUBJECTIVE: Off pressors.  Severe tachycardia and desaturation this AM requiring replacement back on vent.  VITAL SIGNS: Temp:  [97.9 F (36.6 C)-99.3 F (37.4 C)] 97.9 F (36.6 C) (07/23 0800) Pulse Rate:  [66-152] 91 (07/23 1000) Resp:  [16-23] 20 (07/23 1000) BP: (85-164)/(43-114) 150/85 mmHg (07/23 1000) SpO2:  [94 %-100 %] 98 %  (07/23 1000) FiO2 (%):  [40 %] 40 % (07/23 0837) Weight:  [85.7 kg (188 lb 15 oz)] 85.7 kg (188 lb 15 oz) (07/23 0500)  HEMODYNAMICS: CVP:  [9 mmHg-13 mmHg] 10 mmHg  VENTILATOR SETTINGS: Vent Mode:  [-] PCV FiO2 (%):  [40 %] 40 % Set Rate:  [16 bmp] 16 bmp PEEP:  [5 cmH20] 5 cmH20 Plateau Pressure:  [17 cmH20-20 cmH20] 17 cmH20  INTAKE / OUTPUT: Intake/Output     07/22 0701 - 07/23 0700 07/23 0701 - 07/24 0700   I.V. (mL/kg) 2534.6 (29.6) 110 (1.3)   Other 100    NG/GT 30    IV Piggyback 50    Total Intake(mL/kg) 2714.6 (31.7) 110 (1.3)   Urine (mL/kg/hr) 1560 (0.8)    Total Output 1560     Net +1154.6 +110        Stool Occurrence 2 x     PHYSICAL EXAMINATION: Gen: No distress, much calmer this AM. HEENT: NG tube out, PEG in place, trach site clean PULM: scattered rhonchi CV: irregular AB: soft, non tender GU: uterine prolapse Ext: no edema Derm: no rash Neuro: RASS 0, moves extremities, follows simple commands  LABS: CBC Recent Labs     11/27/12  0430  11/28/12  0900  11/29/12  0400  WBC  28.2*  22.8*  14.4*  HGB  8.6*  9.2*  7.2*  HCT  26.2*  28.7*  22.8*  PLT  188  142*  141*   Coag's Recent Labs     11/27/12  0430  11/28/12  0900  11/29/12  0400  INR  1.42  1.17  1.43   BMET Recent Labs     11/27/12  0430  11/28/12  1733  11/29/12  0400  NA  147*  151*  153*  K  4.8  4.9  4.4  CL  117*  123*  126*  CO2  22  18*  22  BUN  49*  48*  54*  CREATININE  2.45*  1.96*  1.94*  GLUCOSE  205*  152*  121*   Electrolytes Recent Labs     11/27/12  0430  11/28/12  1733  11/29/12  0400  CALCIUM  13.5*  13.1*  13.0*  MG  2.2  1.9  1.9  PHOS  3.2  3.4  3.3   Glucose Recent Labs     11/28/12  0754  11/28/12  1126  11/28/12  1650  11/28/12  2043  11/29/12  0029  11/29/12  0351  GLUCAP  112*  151*  141*  186*  137*  130*    Imaging Ir Gastrostomy Tube Mod Sed  11/28/2012   *RADIOLOGY REPORT*  Indication:  Malnutrition  PULL TROUGH  GASTOSTOMY TUBE PLACEMENT  Comparison: Abdominal radiograph - earlier same day; CT abdomen pelvis - 11/22/2012  Medications:  Versed 2 mg IV; Fentanyl 25 mcg IV; the patient is currently admitted to the hospital receiving intravenous antibiotic; Antibiotics were administered within 1 hour of the procedure.  Contrast volume:  20 mL Omnipaque-300 administered into the gastric lumen  Sedation time: 8 minutes  Fluoroscopy time: 1 minute, 48 seconds  Complications: None immediate  PROCEDURE/FINDINGS:  Informed written consent was obtained from the patient's family following explanation of the procedure, risks, benefits and alternatives.  A time out was performed prior to the initiation of the procedure.  Maximal barrier sterile technique utilized including caps, mask, sterile gowns, sterile gloves, large sterile drape, hand hygiene and Betadine prep.  The left upper quadrant was sterilely prepped and draped.  An oral gastric catheter was inserted into the stomach under fluoroscopy. The existing nasogastric feeding tube was removed.  The left costal margin and barium opacified transverse colon were identified and avoided.  Air was injected into the stomach for insufflation and visualization under fluoroscopy.  Under sterile conditions a 17 gauge trocar needle was utilized to access the stomach percutaneously beneath the left subcostal margin after the overlying soft tissues were anesthetized with 1% Lidocaine with epinephrine.  Needle position was confirmed within the stomach with aspiration of air and injection of small amount of contrast.   A single T tack was deployed for gastropexy.  Over an Amplatz guide wire, a 9-French sheath was inserted into the stomach.  A snare device was utilized to capture the oral gastric catheter.  The snare device was pulled retrograde from the stomach up the esophagus and out the oropharynx.  The 20-French pull-through gastrostomy was connected to the snare device and pulled antegrade  through the oropharynx down the esophagus into the stomach and then through the percutaneous tract external to the patient.  The gastrostomy was assembled externally.  Contrast injection confirms position in the stomach.   Several spot radiographic images were obtained in various obliquities for documentation.  The patient tolerated procedure well without immediate post procedural complication.  IMPRESSION:  Successful fluoroscopic insertion of a 20-French "pull-through" gastrostomy.   Original Report Authenticated By: Tacey Ruiz, MD   Dg Abd Portable 1v  11/28/2012   *  RADIOLOGY REPORT*  Clinical Data: Evaluate barium prior to potential percutaneous gastrostomy tube placement  PORTABLE ABDOMEN - 1 VIEW  Comparison: CT abdomen pelvis - 11/22/2012; chest radiograph - 11/25/2012  Findings:  Enteric contrast is seen throughout the colon.  No evidence of enteric obstruction.  No supine evidence of pneumoperitoneum.  No definite pneumatosis or portal venous gas. An enteric tube tip and side port project over the expected location of the gastric antrum.  Limited visualization of the lower thorax demonstrates enlarged cardiac silhouette.  Grossly unchanged bones.  IMPRESSION: Enteric contrast seen throughout the colon.  No evidence of obstruction.   Original Report Authenticated By: Tacey Ruiz, MD   ASSESSMENT / PLAN:  PULMONARY A: Acute respiratory failure 2nd to pulmonary infiltrates >> ? Cause. Failure to wean from ventilator >> s/p trach 7/17. Failed weaning this AM, patient not tolerating weaning due to fluid overload and increase HR. P:   - Continue vent wean as tolerated. - F/u CXR. - PRN BD's. - Will give 3 doses of lasix today in an attempt to help with weaning.  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 A fib with RVR >> amiodarone d/c due to bradycardia, hypotension. Hx of CAD, HTN, hyperlipidemia. Shock >> requiring levophed overnight P:  - Hold lipitor for  now. - Lasix 40 mg IV x3 doses. - Solu cortef added 7/15 for ?relative adrenal insufficiency, begin down titration now that is off pressors.  RENAL A: Acute kidney injury >> renal fx stable. Hypernatremia >> improved Hypokalemia. Hypercalcemia >> PTH 678.6 from 7/16. P:   - KVO IV fluids. - Monitor renal fx, urine outpt. - F/U electrolytes. - Restart lasix IV and free water. - Replace electrolytes as needed.  GASTROINTESTINAL A:  Protein malnutrition. Vomiting 7/14 >> improved. Mass head of pancreas. Spoke with family, ok with PEG placement P:   - Restart TF at 5 PM today. - Pepcid for SUP. - Will need further assessment of pancreatic lesion when more stable, likely in LTAC or outpatient. - Free water as ordered.  GYN A: Uterine prolapse. P: - GYN consult called on Friday, prolapse per GYN.  HEMATOLOGIC A: Anemia of chronic disease and critical illness >> PRBC transfusion 7/17, 7/18. Elevated WBC likely due to steroids and stress, no signs of active infection at this time. Chronic coumadin therapy for a fib. P:  - F/u CBC. - Transfuse for Hb < 7. - Continue heparin gtt, now that PEG is in place will start coumadin.  INFECTIOUS A: Initial concern for aspiration pneumonia >> has persistent pulmonary infiltrates, low grade temperature, and increasing WBC. P:   - D20/21 of Abx >> continue cefepime, will d/c 7/24 dose. - D/Ced vancomycin 7/18.  ENDOCRINE A:  DM2 P:   - SSI. - D/Ced lantus until sure she can tolerate enteral feedings.  NEUROLOGIC A: Acute encephalopathy 2nd to hypoxia/hypercapnia. Muscular deconditioning. P:   - Continue fentanyl patch 100 mcg q72. - Klonipin for anxiety 1 mg BID. - PT/OT as ordered, will need .  PEG placed by IR yesterday, awaiting placement, will need LTAC for long term weaning.  Aggressive diureses and water replacement via GI tract today.  Change all KVO to free water and start D5W at 50 ml/hr x2 L.  CC time 35  minutes.  Alyson Reedy, M.D. [facility] Pulmonary/Critical Care Medicine. Pager: [phone]. After hours pager: [phone].

## 2012-11-29 NOTE — Progress Notes (Signed)
ANTICOAGULATION CONSULT NOTE - Follow Up Consult  Pharmacy Consult for Heparin Indication: atrial fibrillation  No Known Allergies  Patient Measurements: Height: 5\' 2"  (157.5 cm) Weight: 188 lb 15 oz (85.7 kg) IBW/kg (Calculated) : 50.1 Heparin Dosing Weight: 68kg  Vital Signs: Temp: 97.9 F (36.6 C) (07/23 0800) Temp src: Oral (07/23 0800) BP: 118/61 mmHg (07/23 0837) Pulse Rate: 74 (07/23 0837)  Labs:  Recent Labs  11/27/12 0430 11/28/12 0900 11/28/12 1733 11/29/12 0400 11/29/12 0800  HGB 8.6* 9.2*  --  7.2*  --   HCT 26.2* 28.7*  --  22.8*  --   PLT 188 142*  --  141*  --   LABPROT 17.0* 14.7  --  17.1*  --   INR 1.42 1.17  --  1.43  --   HEPARINUNFRC 0.35 <0.10*  --  0.20* 0.28*  CREATININE 2.45*  --  1.96* 1.94*  --     Estimated Creatinine Clearance: 26.6 ml/min (by C-G formula based on Cr of 1.94).   Medications:  Heparin @ 1050 units/hr  Assessment: 72yof s/p PEG placement 7/22 resumed on heparin and coumadin early this morning for afib. She had been previously therapeutic on 1050 units/hr but her heparin level today is slightly below goal on same rate so will increase. Renal function improving. Noted drop in Hgb from 9.2 to 7.2, platelets stable at 141. No bleeding reported.   Goal of Therapy:  Heparin level 0.3-0.7 units/ml (targeting lower end of range due to anemia and bleeding)  Monitor platelets by anticoagulation protocol: Yes   Plan:  1) Increase heparin to 1100 units/hr 2) Follow up heparin level, CBC in AM  Fredrik Rigger 11/29/2012,9:38 AM

## 2012-11-29 NOTE — Progress Notes (Signed)
SUBJECTIVE:  Trached. Opens eyes and moves all extremities.  However, she is not having any purposeful response.    PHYSICAL EXAM Filed Vitals:   11/29/12 0400 11/29/12 0500 11/29/12 0505 11/29/12 0600  BP: 106/63  134/113 139/84  Pulse: 78  80 76  Temp: 98.7 F (37.1 C)     TempSrc: Oral     Resp: 16  18 17   Height:      Weight:  188 lb 15 oz (85.7 kg)    SpO2: 97%  94% 97%   General:  No distress Lungs:  Clear Heart:  Irregular Abdomen:  Positive bowel sounds, no rebound no guarding Extremities:  No edema, nonfocal   LABS:  Results for orders placed during the hospital encounter of 11/09/12 (from the past 24 hour(s))  GLUCOSE, CAPILLARY     Status: Abnormal   Collection Time    11/28/12  7:54 AM      Result Value Range   Glucose-Capillary 112 (*) 70 - 99 mg/dL  PROTIME-INR     Status: None   Collection Time    11/28/12  9:00 AM      Result Value Range   Prothrombin Time 14.7  11.6 - 15.2 seconds   INR 1.17  0.00 - 1.49  HEPARIN LEVEL (UNFRACTIONATED)     Status: Abnormal   Collection Time    11/28/12  9:00 AM      Result Value Range   Heparin Unfractionated <0.10 (*) 0.30 - 0.70 IU/mL  CBC WITH DIFFERENTIAL     Status: Abnormal   Collection Time    11/28/12  9:00 AM      Result Value Range   WBC 22.8 (*) 4.0 - 10.5 K/uL   RBC 3.39 (*) 3.87 - 5.11 MIL/uL   Hemoglobin 9.2 (*) 12.0 - 15.0 g/dL   HCT 25.3 (*) 66.4 - 40.3 %   MCV 84.7  78.0 - 100.0 fL   MCH 27.1  26.0 - 34.0 pg   MCHC 32.1  30.0 - 36.0 g/dL   RDW 47.4 (*) 25.9 - 56.3 %   Platelets 142 (*) 150 - 400 K/uL   Neutrophils Relative % 92 (*) 43 - 77 %   Lymphocytes Relative 3 (*) 12 - 46 %   Monocytes Relative 4  3 - 12 %   Eosinophils Relative 1  0 - 5 %   Basophils Relative 0  0 - 1 %   Neutro Abs 21.0 (*) 1.7 - 7.7 K/uL   Lymphs Abs 0.7  0.7 - 4.0 K/uL   Monocytes Absolute 0.9  0.1 - 1.0 K/uL   Eosinophils Absolute 0.2  0.0 - 0.7 K/uL   Basophils Absolute 0.0  0.0 - 0.1 K/uL   RBC  Morphology POLYCHROMASIA PRESENT     Smear Review LARGE PLATELETS PRESENT    GLUCOSE, CAPILLARY     Status: Abnormal   Collection Time    11/28/12 11:26 AM      Result Value Range   Glucose-Capillary 151 (*) 70 - 99 mg/dL  GLUCOSE, CAPILLARY     Status: Abnormal   Collection Time    11/28/12  4:50 PM      Result Value Range   Glucose-Capillary 141 (*) 70 - 99 mg/dL  BASIC METABOLIC PANEL     Status: Abnormal   Collection Time    11/28/12  5:33 PM      Result Value Range   Sodium 151 (*) 135 - 145 mEq/L  Potassium 4.9  3.5 - 5.1 mEq/L   Chloride 123 (*) 96 - 112 mEq/L   CO2 18 (*) 19 - 32 mEq/L   Glucose, Bld 152 (*) 70 - 99 mg/dL   BUN 48 (*) 6 - 23 mg/dL   Creatinine, Ser 1.61 (*) 0.50 - 1.10 mg/dL   Calcium 09.6 (*) 8.4 - 10.5 mg/dL   GFR calc non Af Amer 24 (*) >90 mL/min   GFR calc Af Amer 28 (*) >90 mL/min  MAGNESIUM     Status: None   Collection Time    11/28/12  5:33 PM      Result Value Range   Magnesium 1.9  1.5 - 2.5 mg/dL  PHOSPHORUS     Status: None   Collection Time    11/28/12  5:33 PM      Result Value Range   Phosphorus 3.4  2.3 - 4.6 mg/dL  GLUCOSE, CAPILLARY     Status: Abnormal   Collection Time    11/28/12  8:43 PM      Result Value Range   Glucose-Capillary 186 (*) 70 - 99 mg/dL  GLUCOSE, CAPILLARY     Status: Abnormal   Collection Time    11/29/12 12:29 AM      Result Value Range   Glucose-Capillary 137 (*) 70 - 99 mg/dL  GLUCOSE, CAPILLARY     Status: Abnormal   Collection Time    11/29/12  3:51 AM      Result Value Range   Glucose-Capillary 130 (*) 70 - 99 mg/dL  PROTIME-INR     Status: Abnormal   Collection Time    11/29/12  4:00 AM      Result Value Range   Prothrombin Time 17.1 (*) 11.6 - 15.2 seconds   INR 1.43  0.00 - 1.49  CBC     Status: Abnormal   Collection Time    11/29/12  4:00 AM      Result Value Range   WBC 14.4 (*) 4.0 - 10.5 K/uL   RBC 2.68 (*) 3.87 - 5.11 MIL/uL   Hemoglobin 7.2 (*) 12.0 - 15.0 g/dL   HCT 04.5  (*) 40.9 - 46.0 %   MCV 85.1  78.0 - 100.0 fL   MCH 26.9  26.0 - 34.0 pg   MCHC 31.6  30.0 - 36.0 g/dL   RDW 81.1 (*) 91.4 - 78.2 %   Platelets 141 (*) 150 - 400 K/uL  HEPARIN LEVEL (UNFRACTIONATED)     Status: Abnormal   Collection Time    11/29/12  4:00 AM      Result Value Range   Heparin Unfractionated 0.20 (*) 0.30 - 0.70 IU/mL  BASIC METABOLIC PANEL     Status: Abnormal   Collection Time    11/29/12  4:00 AM      Result Value Range   Sodium 153 (*) 135 - 145 mEq/L   Potassium 4.4  3.5 - 5.1 mEq/L   Chloride 126 (*) 96 - 112 mEq/L   CO2 22  19 - 32 mEq/L   Glucose, Bld 121 (*) 70 - 99 mg/dL   BUN 54 (*) 6 - 23 mg/dL   Creatinine, Ser 9.56 (*) 0.50 - 1.10 mg/dL   Calcium 21.3 (*) 8.4 - 10.5 mg/dL   GFR calc non Af Amer 25 (*) >90 mL/min   GFR calc Af Amer 29 (*) >90 mL/min  MAGNESIUM     Status: None   Collection Time    11/29/12  4:00 AM      Result Value Range   Magnesium 1.9  1.5 - 2.5 mg/dL  PHOSPHORUS     Status: None   Collection Time    11/29/12  4:00 AM      Result Value Range   Phosphorus 3.3  2.3 - 4.6 mg/dL    Intake/Output Summary (Last 24 hours) at 11/29/12 0631 Last data filed at 11/29/12 0600  Gross per 24 hour  Intake 2704.58 ml  Output   1560 ml  Net 1144.58 ml    ASSESSMENT AND PLAN:  Acute on chronic diastolic congestive heart failure:    Lasix held as creat bumped with diuresis as well as hypotension.   CVP 10 again today. She is receiving free water for hypercalcemia and hypernatremia.  Lasix still being held.    CHRONIC KIDNEY DISEASE STAGE V:  Creat stable  As above.   FIBRILLATION, ATRIAL:  Continue rate control and anticoagulation for now.  No plan for cardioversion at this time as she was off warfarin for trach.  Restart warfarin if no further invasive procedures planned.   Acute respiratory failure with hypoxia:  Status post trach.  Plan per CCM.    Rollene Rotunda 11/29/2012 6:31 AM

## 2012-11-29 NOTE — Progress Notes (Signed)
Subjective: S/p perc G tube 7/22; unable to wean from vent Objective: Vital signs in last 24 hours: Temp:  [97.9 F (36.6 C)-99.3 F (37.4 C)] 97.9 F (36.6 C) (07/23 0800) Pulse Rate:  [66-152] 91 (07/23 1000) Resp:  [16-23] 20 (07/23 1000) BP: (85-164)/(43-114) 150/85 mmHg (07/23 1000) SpO2:  [94 %-100 %] 98 % (07/23 1000) FiO2 (%):  [40 %] 40 % (07/23 0837) Weight:  [188 lb 15 oz (85.7 kg)] 188 lb 15 oz (85.7 kg) (07/23 0500) Last BM Date: 11/28/12  Intake/Output from previous day: 07/22 0701 - 07/23 0700 In: 2714.6 [I.V.:2534.6; NG/GT:30; IV Piggyback:50] Out: 1560 [Urine:1560] Intake/Output this shift: Total I/O In: 607 [I.V.:557; IV Piggyback:50] Out: 375 [Urine:375]  G tube intact, insertion site ok , abd soft,ND  Lab Results:   Recent Labs  11/28/12 0900 11/29/12 0400  WBC 22.8* 14.4*  HGB 9.2* 7.2*  HCT 28.7* 22.8*  PLT 142* 141*   BMET  Recent Labs  11/28/12 1733 11/29/12 0400  NA 151* 153*  K 4.9 4.4  CL 123* 126*  CO2 18* 22  GLUCOSE 152* 121*  BUN 48* 54*  CREATININE 1.96* 1.94*  CALCIUM 13.1* 13.0*   PT/INR  Recent Labs  11/28/12 0900 11/29/12 0400  LABPROT 14.7 17.1*  INR 1.17 1.43   ABG No results found for this basename: PHART, PCO2, PO2, HCO3,  in the last 72 hours  Studies/Results: Ir Gastrostomy Tube Mod Sed  11/28/2012   *RADIOLOGY REPORT*  Indication:  Malnutrition  PULL TROUGH GASTOSTOMY TUBE PLACEMENT  Comparison: Abdominal radiograph - earlier same day; CT abdomen pelvis - 11/22/2012  Medications:  Versed 2 mg IV; Fentanyl 25 mcg IV; the patient is currently admitted to the hospital receiving intravenous antibiotic; Antibiotics were administered within 1 hour of the procedure.  Contrast volume:  20 mL Omnipaque-300 administered into the gastric lumen  Sedation time: 8 minutes  Fluoroscopy time: 1 minute, 48 seconds  Complications: None immediate  PROCEDURE/FINDINGS:  Informed written consent was obtained from the  patient's family following explanation of the procedure, risks, benefits and alternatives.  A time out was performed prior to the initiation of the procedure.  Maximal barrier sterile technique utilized including caps, mask, sterile gowns, sterile gloves, large sterile drape, hand hygiene and Betadine prep.  The left upper quadrant was sterilely prepped and draped.  An oral gastric catheter was inserted into the stomach under fluoroscopy. The existing nasogastric feeding tube was removed.  The left costal margin and barium opacified transverse colon were identified and avoided.  Air was injected into the stomach for insufflation and visualization under fluoroscopy.  Under sterile conditions a 17 gauge trocar needle was utilized to access the stomach percutaneously beneath the left subcostal margin after the overlying soft tissues were anesthetized with 1% Lidocaine with epinephrine.  Needle position was confirmed within the stomach with aspiration of air and injection of small amount of contrast.   A single T tack was deployed for gastropexy.  Over an Amplatz guide wire, a 9-French sheath was inserted into the stomach.  A snare device was utilized to capture the oral gastric catheter.  The snare device was pulled retrograde from the stomach up the esophagus and out the oropharynx.  The 20-French pull-through gastrostomy was connected to the snare device and pulled antegrade through the oropharynx down the esophagus into the stomach and then through the percutaneous tract external to the patient.  The gastrostomy was assembled externally.  Contrast injection confirms position in the stomach.  Several spot radiographic images were obtained in various obliquities for documentation.  The patient tolerated procedure well without immediate post procedural complication.  IMPRESSION:  Successful fluoroscopic insertion of a 20-French "pull-through" gastrostomy.   Original Report Authenticated By: Tacey Ruiz, MD   Dg Abd  Portable 1v  11/28/2012   *RADIOLOGY REPORT*  Clinical Data: Evaluate barium prior to potential percutaneous gastrostomy tube placement  PORTABLE ABDOMEN - 1 VIEW  Comparison: CT abdomen pelvis - 11/22/2012; chest radiograph - 11/25/2012  Findings:  Enteric contrast is seen throughout the colon.  No evidence of enteric obstruction.  No supine evidence of pneumoperitoneum.  No definite pneumatosis or portal venous gas. An enteric tube tip and side port project over the expected location of the gastric antrum.  Limited visualization of the lower thorax demonstrates enlarged cardiac silhouette.  Grossly unchanged bones.  IMPRESSION: Enteric contrast seen throughout the colon.  No evidence of obstruction.   Original Report Authenticated By: Tacey Ruiz, MD    Anti-infectives: Anti-infectives   Start     Dose/Rate Route Frequency Ordered Stop   11/22/12 1000  ceFEPIme (MAXIPIME) 2 g in dextrose 5 % 50 mL IVPB     2 g 100 mL/hr over 30 Minutes Intravenous Every 24 hours 11/22/12 0904     11/17/12 2200  vancomycin (VANCOCIN) IVPB 750 mg/150 ml premix  Status:  Discontinued     750 mg 150 mL/hr over 60 Minutes Intravenous Every 24 hours 11/17/12 1129 11/24/12 0852   11/14/12 1000  vancomycin (VANCOCIN) IVPB 1000 mg/200 mL premix  Status:  Discontinued     1,000 mg 200 mL/hr over 60 Minutes Intravenous Every 24 hours 11/14/12 0950 11/17/12 1129   11/13/12 1400  ampicillin-sulbactam (UNASYN) 1.5 g in sodium chloride 0.9 % 50 mL IVPB  Status:  Discontinued     1.5 g 100 mL/hr over 30 Minutes Intravenous Every 12 hours 11/13/12 1340 11/22/12 0804   11/12/12 1000  levofloxacin (LEVAQUIN) IVPB 750 mg  Status:  Discontinued     750 mg 100 mL/hr over 90 Minutes Intravenous Every 48 hours 11/10/12 0836 11/13/12 1321   11/11/12 0000  vancomycin (VANCOCIN) IVPB 750 mg/150 ml premix  Status:  Discontinued     750 mg 150 mL/hr over 60 Minutes Intravenous Every 24 hours 11/10/12 0844 11/12/12 0913   11/10/12  2000  cefTRIAXone (ROCEPHIN) 1 g in dextrose 5 % 50 mL IVPB  Status:  Discontinued     1 g 100 mL/hr over 30 Minutes Intravenous Every 24 hours 11/10/12 0810 11/12/12 0836   11/10/12 1000  vancomycin (VANCOCIN) IVPB 750 mg/150 ml premix  Status:  Discontinued     750 mg 150 mL/hr over 60 Minutes Intravenous Every 24 hours 11/10/12 0836 11/10/12 0844   11/10/12 0930  levofloxacin (LEVAQUIN) IVPB 750 mg     750 mg 100 mL/hr over 90 Minutes Intravenous  Once 11/10/12 0835 11/10/12 1055   11/10/12 0930  vancomycin (VANCOCIN) 1,250 mg in sodium chloride 0.9 % 250 mL IVPB     1,250 mg 166.7 mL/hr over 90 Minutes Intravenous  Once 11/10/12 0835 11/10/12 1055   11/09/12 2000  cefTRIAXone (ROCEPHIN) 1 g in dextrose 5 % 50 mL IVPB     1 g 100 mL/hr over 30 Minutes Intravenous  Once 11/09/12 1951 11/09/12 2030   11/09/12 1945  cefTRIAXone (ROCEPHIN) injection 1 g  Status:  Discontinued     1 g Intramuscular  Once 11/09/12 1935 11/09/12 1951  Assessment/Plan: S/p perc gastrostomy tube placement 7/22; ok to use tube later today; other plans as per CCM   Terrace Chiem,D [facility] 11/29/2012

## 2012-11-29 NOTE — Progress Notes (Addendum)
ANTICOAGULATION CONSULT NOTE - Follow Up Consult  Pharmacy Consult for heparin Indication: atrial fibrillation  Labs:  Recent Labs  11/26/12 0440 11/27/12 0430 11/28/12 0900 11/28/12 1733 11/29/12 0400  HGB 8.1* 8.6* 9.2*  --  7.2*  HCT 24.5* 26.2* 28.7*  --  22.8*  PLT 159 188 142*  --  141*  LABPROT 19.1* 17.0* 14.7  --  17.1*  INR 1.66* 1.42 1.17  --  1.43  HEPARINUNFRC 0.30 0.35 <0.10*  --  0.20*  CREATININE 2.33* 2.45*  --  1.96*  --     Assessment/Plan:  72yo female with low heparin level though lab was drawn just 3.5 hours after being resumed.  Will continue gtt for now and check another level 8hr after started.  Vernard Gambles, PharmD, BCPS  11/29/2012,4:40 AM  Addendum: Resuming Warfarin today.  Current INR is 1.43 and she is without noted bleeding complications.  Will resume her home regimen and follow closely.  Warfarin 2.5mg  daily except 5mg  on Mondays.  Nadara Mustard, PharmD., MS Clinical Pharmacist Pager:  [phone] Thank you for allowing pharmacy to be part of this patients care team.

## 2012-11-29 NOTE — Progress Notes (Signed)
Trach Team Note  SLP follow chart for trach team. Pt still on vent. Will continue to monitor progress.  Harlon Ditty, MA CCC-SLP [phone]

## 2012-11-30 ENCOUNTER — Inpatient Hospital Stay (HOSPITAL_COMMUNITY): Payer: Medicare Other

## 2012-11-30 LAB — CBC
MCH: 26.6 pg (ref 26.0–34.0)
MCV: 83.9 fL (ref 78.0–100.0)
Platelets: 128 10*3/uL — ABNORMAL LOW (ref 150–400)
RDW: 18.6 % — ABNORMAL HIGH (ref 11.5–15.5)
WBC: 14.3 10*3/uL — ABNORMAL HIGH (ref 4.0–10.5)

## 2012-11-30 LAB — BLOOD GAS, ARTERIAL
Bicarbonate: 21.3 mEq/L (ref 20.0–24.0)
PEEP: 5 cmH2O
pCO2 arterial: 35.5 mmHg (ref 35.0–45.0)
pH, Arterial: 7.394 (ref 7.350–7.450)
pO2, Arterial: 87.9 mmHg (ref 80.0–100.0)

## 2012-11-30 LAB — BASIC METABOLIC PANEL
BUN: 58 mg/dL — ABNORMAL HIGH (ref 6–23)
Creatinine, Ser: 2.15 mg/dL — ABNORMAL HIGH (ref 0.50–1.10)
GFR calc Af Amer: 25 mL/min — ABNORMAL LOW (ref >90)
GFR calc non Af Amer: 22 mL/min — ABNORMAL LOW (ref >90)
Potassium: 3.8 mEq/L (ref 3.5–5.1)

## 2012-11-30 LAB — GLUCOSE, CAPILLARY
Glucose-Capillary: 173 mg/dL — ABNORMAL HIGH (ref 70–99)
Glucose-Capillary: 206 mg/dL — ABNORMAL HIGH (ref 70–99)

## 2012-11-30 LAB — HEPARIN LEVEL (UNFRACTIONATED): Heparin Unfractionated: 0.75 IU/mL — ABNORMAL HIGH (ref 0.30–0.70)

## 2012-11-30 MED ORDER — WARFARIN SODIUM 5 MG PO TABS
5.0000 mg | ORAL_TABLET | Freq: Once | ORAL | Status: AC
  Administered 2012-11-30: 5 mg via ORAL
  Filled 2012-11-30: qty 1

## 2012-11-30 MED ORDER — FUROSEMIDE 10 MG/ML IJ SOLN
40.0000 mg | Freq: Four times a day (QID) | INTRAMUSCULAR | Status: AC
  Administered 2012-11-30 – 2012-12-01 (×3): 40 mg via INTRAVENOUS
  Filled 2012-11-30 (×3): qty 4

## 2012-11-30 MED ORDER — HEPARIN (PORCINE) IN NACL 100-0.45 UNIT/ML-% IJ SOLN
900.0000 [IU]/h | INTRAMUSCULAR | Status: DC
  Administered 2012-11-30: 850 [IU]/h via INTRAVENOUS
  Administered 2012-12-02: 900 [IU]/h via INTRAVENOUS
  Filled 2012-11-30 (×5): qty 250

## 2012-11-30 NOTE — Progress Notes (Signed)
Back on PRVC mode on vent . HR sustained in 80's . Breathing regular and  Unlabored .Daughter stated she will be departing for the day .

## 2012-11-30 NOTE — Progress Notes (Signed)
SUBJECTIVE:  Trached. Opens eyes and moves all extremities.  However, she is not having any purposeful response again today.    PHYSICAL EXAM Filed Vitals:   11/30/12 0339 11/30/12 0400 11/30/12 0500 11/30/12 0600  BP: 119/73 91/56 91/63  96/58  Pulse: 73 58  77  Temp:  98.1 F (36.7 C)    TempSrc:  Axillary    Resp: 16 16 16 16   Height:      Weight:  182 lb 1.6 oz (82.6 kg)    SpO2: 99% 98% 98% 100%   General:  No distress Lungs:  Clear Heart:  Irregular Abdomen:  Positive bowel sounds, no rebound no guarding Extremities:  No edema, nonfocal   LABS:  Results for orders placed during the hospital encounter of 11/09/12 (from the past 24 hour(s))  HEPARIN LEVEL (UNFRACTIONATED)     Status: Abnormal   Collection Time    11/29/12  8:00 AM      Result Value Range   Heparin Unfractionated 0.28 (*) 0.30 - 0.70 IU/mL  GLUCOSE, CAPILLARY     Status: Abnormal   Collection Time    11/29/12  8:00 AM      Result Value Range   Glucose-Capillary 67 (*) 70 - 99 mg/dL  GLUCOSE, CAPILLARY     Status: Abnormal   Collection Time    11/29/12 11:38 AM      Result Value Range   Glucose-Capillary 154 (*) 70 - 99 mg/dL  GLUCOSE, CAPILLARY     Status: Abnormal   Collection Time    11/29/12  4:50 PM      Result Value Range   Glucose-Capillary 172 (*) 70 - 99 mg/dL  GLUCOSE, CAPILLARY     Status: Abnormal   Collection Time    11/29/12  8:09 PM      Result Value Range   Glucose-Capillary 237 (*) 70 - 99 mg/dL   Comment 1 Documented in Chart     Comment 2 Notify RN    GLUCOSE, CAPILLARY     Status: Abnormal   Collection Time    11/29/12 11:48 PM      Result Value Range   Glucose-Capillary 247 (*) 70 - 99 mg/dL   Comment 1 Notify RN     Comment 2 Documented in Chart    BLOOD GAS, ARTERIAL     Status: Abnormal   Collection Time    11/30/12  3:12 AM      Result Value Range   FIO2 0.40     Delivery systems VENTILATOR     Mode PRESSURE CONTROL     Rate 16     Peep/cpap 5.0     pH, Arterial 7.394  7.350 - 7.450   pCO2 arterial 35.5  35.0 - 45.0 mmHg   pO2, Arterial 87.9  80.0 - 100.0 mmHg   Bicarbonate 21.3  20.0 - 24.0 mEq/L   TCO2 22.3  0 - 100 mmol/L   Acid-base deficit 2.8 (*) 0.0 - 2.0 mmol/L   O2 Saturation 97.0     Patient temperature 98.6     Collection site LEFT RADIAL     Drawn by [id number]     Sample type ARTERIAL DRAW     Allens test (pass/fail) PASS  PASS  GLUCOSE, CAPILLARY     Status: Abnormal   Collection Time    11/30/12  3:53 AM      Result Value Range   Glucose-Capillary 162 (*) 70 - 99 mg/dL   Comment 1 Notify  RN     Comment 2 Documented in Chart    PROTIME-INR     Status: Abnormal   Collection Time    11/30/12  4:00 AM      Result Value Range   Prothrombin Time 16.7 (*) 11.6 - 15.2 seconds   INR 1.39  0.00 - 1.49  CBC     Status: Abnormal   Collection Time    11/30/12  4:00 AM      Result Value Range   WBC 14.3 (*) 4.0 - 10.5 K/uL   RBC 3.05 (*) 3.87 - 5.11 MIL/uL   Hemoglobin 8.1 (*) 12.0 - 15.0 g/dL   HCT 16.1 (*) 09.6 - 04.5 %   MCV 83.9  78.0 - 100.0 fL   MCH 26.6  26.0 - 34.0 pg   MCHC 31.6  30.0 - 36.0 g/dL   RDW 40.9 (*) 81.1 - 91.4 %   Platelets 128 (*) 150 - 400 K/uL  HEPARIN LEVEL (UNFRACTIONATED)     Status: Abnormal   Collection Time    11/30/12  4:00 AM      Result Value Range   Heparin Unfractionated 0.72 (*) 0.30 - 0.70 IU/mL  BASIC METABOLIC PANEL     Status: Abnormal   Collection Time    11/30/12  4:00 AM      Result Value Range   Sodium 145  135 - 145 mEq/L   Potassium 3.8  3.5 - 5.1 mEq/L   Chloride 115 (*) 96 - 112 mEq/L   CO2 23  19 - 32 mEq/L   Glucose, Bld 264 (*) 70 - 99 mg/dL   BUN 58 (*) 6 - 23 mg/dL   Creatinine, Ser 7.82 (*) 0.50 - 1.10 mg/dL   Calcium 95.6 (*) 8.4 - 10.5 mg/dL   GFR calc non Af Amer 22 (*) >90 mL/min   GFR calc Af Amer 25 (*) >90 mL/min  MAGNESIUM     Status: None   Collection Time    11/30/12  4:00 AM      Result Value Range   Magnesium 1.9  1.5 - 2.5 mg/dL    PHOSPHORUS     Status: None   Collection Time    11/30/12  4:00 AM      Result Value Range   Phosphorus 3.2  2.3 - 4.6 mg/dL    Intake/Output Summary (Last 24 hours) at 11/30/12 0640 Last data filed at 11/30/12 0600  Gross per 24 hour  Intake 2966.22 ml  Output   5800 ml  Net -2833.78 ml    ASSESSMENT AND PLAN:  Acute on chronic diastolic congestive heart failure:    Lasix given x 3 doses yesterday.  Free water given as well.  Na is better.  BUN and Creat increased slightly.     CHRONIC KIDNEY DISEASE STAGE V:  Creat increased. As above.   FIBRILLATION, ATRIAL:  Continue rate control and anticoagulation for now.  No plan for cardioversion at this time as she was off warfarin for trach.  Restarted warfarin yesterday.  On heparin.  I will check a digoxin level today.   Acute respiratory failure with hypoxia:  Status post trach.  Plan per CCM.   AMS:  She has had decreased mentation which was not the previous baseline. I will stop the Duragesic and klonopin.     Fayrene Fearing [facility] 11/30/2012 6:40 AM

## 2012-11-30 NOTE — Progress Notes (Signed)
Fentanyl patch on the RUA removed per Dr. Jenene Slicker order. Disposed in the sharps container. Witnessed by Thayer Ohm, RN.Nicholson Starace Seromines

## 2012-11-30 NOTE — Progress Notes (Signed)
Pt not tolerating PS mode on vent . HR to 120's then briefly 140's . Breathing labored.R. T.  made aware .

## 2012-11-30 NOTE — Progress Notes (Signed)
UOP noted on bedpad  but also in foley cath . This px also passed in report in am .Px possibly predisposed by pressure of the prolapsed uterus ??

## 2012-11-30 NOTE — Progress Notes (Signed)
Inpatient Diabetes Program Recommendations  AACE/ADA: New Consensus Statement on Inpatient Glycemic Control (2013)  Target Ranges:  Prepandial:   less than 140 mg/dL      Peak postprandial:   less than 180 mg/dL (1-2 hours)      Critically ill patients:  140 - 180 mg/dL   Reason for Visit: CBG's low on 11/29/12 however back elevated today.  Consider restarting Lantus 15 units daily.  Will follow.

## 2012-11-30 NOTE — Progress Notes (Signed)
PULMONARY  / CRITICAL CARE MEDICINE  Name: [name] MRN: [id number] DOB: [birth year]    ADMISSION DATE:  11/09/2012 CONSULTATION DATE:  11/09/2012  REFERRING MD :  Preston Fleeting PRIMARY SERVICE: PCCM  CHIEF COMPLAINT:  Shortness of breath.  BRIEF PATIENT DESCRIPTION: 72 y/o female with CHF was admitted on 7/3 from the [facility] ED with acute hypoxemic respiratory failure due to a CHF exacerbation, ?Superimposed PNA.  LINES / TUBES: 7/3 ETT >>11/13/12, 11/13/12  (failed extubation immediately, ? aspirated) >> 7/17 7/4 CVC R IJ >> 7/17 7/5 A-line >> 7/18 7/17 Trach (DF) >> 7/17 Rt PICC >>   CULTURES: 7/4 U strep >> neg 7/4 legionella >> neg 7/3 mrsa PCR >> POSITIVE 7/8 mini-BAL >> yeast 7/16 sputum >>  7/16 blood >> 7/16 urine >> yeast 50 K colonies  ANTIBIOTICS: 7/3 Ceftriaxone >>7/6 7/4 levofloxacin >>7/7 7/4 Vancomycin >>7/6, restarted 7/8 >> 7/18 7/7 Unasyn >> 7/16 7/16 cefepime >> 7/24  SIGNIFICANT EVENTS: 7/3 admission 7/07 reintubated immediately after extubation.  ? Aspiration. ABX restarted.  on Neo-Synephrine.  7/09 Able to wean fio2 and neo. Lopressor required for rate control of afib. 7/10 Off neo, but now tachycardic.  Failed SBT this AM, but RT felt it was due to sedation.  Sedation weaned 7/14 Hypotensive, vomiting, amiodarone turned off for low heart rate 7/16 Change to pressure control 7/17 Start pressors, PRBC transfusion, trach 7/18 Off pressors, PRBC transfusion  STUDIES: 7/16 CT chest >> emphysema, b/l ASD, small effusions 7/16 CT abd/pelvis >> 2.6 x 2 cm nodule in head of pancreas, bone changes suggestive of Paget's disease  SUBJECTIVE: Off pressors.  Severe tachycardia and desaturation this AM requiring replacement back on vent.  VITAL SIGNS: Temp:  [96.6 F (35.9 C)-98.2 F (36.8 C)] 96.6 F (35.9 C) (07/24 0800) Pulse Rate:  [50-102] 70 (07/24 1100) Resp:  [16-22] 16 (07/24 1100) BP: (91-164)/(56-90) 118/67 mmHg (07/24 1100) SpO2:  [96 %-100 %] 98 %  (07/24 1100) FiO2 (%):  [40 %] 40 % (07/24 1048) Weight:  [82.6 kg (182 lb 1.6 oz)] 82.6 kg (182 lb 1.6 oz) (07/24 0400)  HEMODYNAMICS: CVP:  [8 mmHg-10 mmHg] 8 mmHg  VENTILATOR SETTINGS: Vent Mode:  [-] PCV FiO2 (%):  [40 %] 40 % Set Rate:  [16 bmp] 16 bmp PEEP:  [5 cmH20] 5 cmH20 Pressure Support:  [10 cmH20] 10 cmH20 Plateau Pressure:  [17 cmH20-20 cmH20] 19 cmH20  INTAKE / OUTPUT: Intake/Output     07/23 0701 - 07/24 0700 07/24 0701 - 07/25 0700   I.V. (mL/kg) 1935.7 (23.4) 70 (0.8)   Other     NG/GT 900 80   IV Piggyback 50 50   Total Intake(mL/kg) 2885.7 (34.9) 200 (2.4)   Urine (mL/kg/hr) 5800 (2.9) 365 (0.8)   Total Output 5800 365   Net -2914.3 -165        Stool Occurrence 2 x     PHYSICAL EXAMINATION: Gen: No distress, much calmer this AM. HEENT: NG tube out, PEG in place, trach site clean PULM: scattered rhonchi CV: irregular AB: soft, non tender GU: uterine prolapse Ext: no edema Derm: no rash Neuro: RASS 0, moves extremities, follows simple commands  LABS: CBC Recent Labs     11/28/12  0900  11/29/12  0400  11/30/12  0400  WBC  22.8*  14.4*  14.3*  HGB  9.2*  7.2*  8.1*  HCT  28.7*  22.8*  25.6*  PLT  142*  141*  128*   Coag's Recent  Labs     11/28/12  0900  11/29/12  0400  11/30/12  0400  INR  1.17  1.43  1.39   BMET Recent Labs     11/28/12  1733  11/29/12  0400  11/30/12  0400  NA  151*  153*  145  K  4.9  4.4  3.8  CL  123*  126*  115*  CO2  18*  22  23  BUN  48*  54*  58*  CREATININE  1.96*  1.94*  2.15*  GLUCOSE  152*  121*  264*   Electrolytes Recent Labs     11/28/12  1733  11/29/12  0400  11/30/12  0400  CALCIUM  13.1*  13.0*  12.9*  MG  1.9  1.9  1.9  PHOS  3.4  3.3  3.2   Glucose Recent Labs     11/29/12  1138  11/29/12  1650  11/29/12  2009  11/29/12  2348  11/30/12  0353  11/30/12  0816  GLUCAP  154*  172*  237*  247*  162*  173*    Imaging Ir Gastrostomy Tube Mod Sed  11/28/2012   *RADIOLOGY  REPORT*  Indication:  Malnutrition  PULL TROUGH GASTOSTOMY TUBE PLACEMENT  Comparison: Abdominal radiograph - earlier same day; CT abdomen pelvis - 11/22/2012  Medications:  Versed 2 mg IV; Fentanyl 25 mcg IV; the patient is currently admitted to the hospital receiving intravenous antibiotic; Antibiotics were administered within 1 hour of the procedure.  Contrast volume:  20 mL Omnipaque-300 administered into the gastric lumen  Sedation time: 8 minutes  Fluoroscopy time: 1 minute, 48 seconds  Complications: None immediate  PROCEDURE/FINDINGS:  Informed written consent was obtained from the patient's family following explanation of the procedure, risks, benefits and alternatives.  A time out was performed prior to the initiation of the procedure.  Maximal barrier sterile technique utilized including caps, mask, sterile gowns, sterile gloves, large sterile drape, hand hygiene and Betadine prep.  The left upper quadrant was sterilely prepped and draped.  An oral gastric catheter was inserted into the stomach under fluoroscopy. The existing nasogastric feeding tube was removed.  The left costal margin and barium opacified transverse colon were identified and avoided.  Air was injected into the stomach for insufflation and visualization under fluoroscopy.  Under sterile conditions a 17 gauge trocar needle was utilized to access the stomach percutaneously beneath the left subcostal margin after the overlying soft tissues were anesthetized with 1% Lidocaine with epinephrine.  Needle position was confirmed within the stomach with aspiration of air and injection of small amount of contrast.   A single T tack was deployed for gastropexy.  Over an Amplatz guide wire, a 9-French sheath was inserted into the stomach.  A snare device was utilized to capture the oral gastric catheter.  The snare device was pulled retrograde from the stomach up the esophagus and out the oropharynx.  The 20-French pull-through gastrostomy was  connected to the snare device and pulled antegrade through the oropharynx down the esophagus into the stomach and then through the percutaneous tract external to the patient.  The gastrostomy was assembled externally.  Contrast injection confirms position in the stomach.   Several spot radiographic images were obtained in various obliquities for documentation.  The patient tolerated procedure well without immediate post procedural complication.  IMPRESSION:  Successful fluoroscopic insertion of a 20-French "pull-through" gastrostomy.   Original Report Authenticated By: Tacey Ruiz, MD   Dg  Chest Port 1 View  11/30/2012   *RADIOLOGY REPORT*  Clinical Data: Evaluate tracheostomy  PORTABLE CHEST - 1 VIEW  Comparison: Portable chest x-ray of 11/25/2012  Findings: The tracheostomy is unchanged in position with the tip overlying the tracheal air shadow well above the carina.  Right PICC line tip is noted to the lower SVC.  Cardiomegaly and pulmonary vascular congestion with effusions remain consistent with mild congestive heart failure.  IMPRESSION:  1.  No change in mild congestive heart failure with effusions. 2.  No change in position of tracheostomy and right central venous line.   Original Report Authenticated By: Dwyane Dee, M.D.   ASSESSMENT / PLAN:  PULMONARY A: Acute respiratory failure 2nd to pulmonary infiltrates, resolving. Able to get to PS today, will maintain as tolerated, agitation is a great obstacle in weaning. P:   - Continue vent wean as tolerated. - F/u CXR. - PRN BD's. - Will give 3 doses of lasix today in an attempt to help with weaning again today.  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 A fib with RVR >> amiodarone d/c due to bradycardia, hypotension. Hx of CAD, HTN, hyperlipidemia. Shock >> requiring levophed overnight P:  - Hold lipitor for now. - Lasix 40 mg IV x3 doses. - Solu cortef added 7/15 for ?relative adrenal insufficiency, on  solucortef 25 q12, will maintain for 3 days then decrease to 12.5 x3 days then d/c.  RENAL A: Acute kidney injury >> renal fx stable. Hypernatremia >> improved Hypokalemia. Hypercalcemia >> PTH 678.6 from 7/16. P:   - KVO IV fluids. - Monitor renal fx, urine outpt. - F/U electrolytes. - Restart lasix IV and free water. - Replace electrolytes as needed.  GASTROINTESTINAL A:  Protein malnutrition. Vomiting 7/14 >> improved. Mass head of pancreas. P:   - Continue TF. - Pepcid for SUP. - Will need further assessment of pancreatic lesion when more stable, likely in LTAC or outpatient. - Free water as ordered.  GYN A: Uterine prolapse. P: - GYN consult called on Friday, prolapse per GYN.  HEMATOLOGIC A: Anemia of chronic disease and critical illness >> PRBC transfusion 7/17, 7/18. Elevated WBC likely due to steroids and stress, no signs of active infection at this time.  Hg now stable. P:  - F/u CBC. - Transfuse for Hb < 7. - Continue coumadin.  INFECTIOUS A: Initial concern for aspiration pneumonia >> has persistent pulmonary infiltrates, low grade temperature, and increasing WBC. P:   - D21/21 of Abx >> D/C cefepime. - D/Ced vancomycin 7/18.  ENDOCRINE A:  DM2 P:   - SSI. - D/Ced lantus until sure she can tolerate enteral feedings.  NEUROLOGIC A: Acute encephalopathy 2nd to hypoxia/hypercapnia. Muscular deconditioning. P:   - Continue fentanyl patch 100 mcg q72. - Klonipin for anxiety 1 mg BID. - PT/OT as ordered .  PEG placed, electrolytes and CBC are now stable, needing long term weaning, ready for discharge to LTAC.  Needs additional diureses to assist with that.  Will order labs for AM and continue weaning.  Spoke with family, ready for LTAC whenever approved by insurance.  CC time 35 min.  Alyson Reedy, M.D. [facility] Pulmonary/Critical Care Medicine. Pager: [phone]. After hours pager: [phone].

## 2012-11-30 NOTE — Progress Notes (Signed)
NUTRITION FOLLOW UP  Intervention:   1.  Enteral nutrition; If pt able to tolerate trickle feeds of Osmolite 1.2 @ 20 mL/hr, recommend advanced to 25 mL/hr goal and resume of protein supplements- Prostat 5 times daily. Nutrition regimen would provide 1220 kcal, 108g protein, and 462 mL free water.  RD to order Prostat 6 times daily with current TF regimen to provide 1176 kcal, 116g protein, and 393 mL free water.   Nutrition Dx:   Inadequate oral intake, ongoing  Monitor:   1.  Enteral nutrition; .Enteral nutrition to provide 60-70% of estimated calorie needs (22-25 kcals/kg ideal body weight) and 100% of estimated protein needs, based on ASPEN guidelines for permissive underfeeding in critically ill obese individuals.  Met, ongoing. Continue underfeeding goals for now.  2.  Wt/wt change; monitor trends.  Ongoing.  Assessment:   Pt admitted with respiratory failure due to CHF exacerbation. Patient is currently trached and intubated. PEG placed (7/22), ready for use (7/23) and TFs resumed. MV: 8.9 L/min Temp:Temp (24hrs), Avg:97.7 F (36.5 C), Min:96.6 F (35.9 C), Max:98.2 F (36.8 C) Pt on vent support at time of visit. Per MD report, no purposeful movement today.   Currently receiving Osmolite 1.2 @ 20 mL/hr with Prostat 6 times daily.    Height: Ht Readings from Last 1 Encounters:  11/09/12 5\' 2"  (1.575 m)    Weight Status:   Wt Readings from Last 1 Encounters:  11/30/12 182 lb 1.6 oz (82.6 kg)  Admission wt: 164 lbs (7/3)  Re-estimated needs:  Kcal: 1649 Permissive underfeeding goals:  1150-1250 kcal/d  Protein: >/=100g Fluid: ~1.8 L/day  Skin: intact, non-pitting edema  Diet Order: NPO   Intake/Output Summary (Last 24 hours) at 11/30/12 1033 Last data filed at 11/30/12 1000  Gross per 24 hour  Intake 2393.72 ml  Output   5350 ml  Net -2956.28 ml    Last BM: 7/22  Labs:   Recent Labs Lab 11/28/12 1733 11/29/12 0400 11/30/12 0400  NA 151* 153* 145   K 4.9 4.4 3.8  CL 123* 126* 115*  CO2 18* 22 23  BUN 48* 54* 58*  CREATININE 1.96* 1.94* 2.15*  CALCIUM 13.1* 13.0* 12.9*  MG 1.9 1.9 1.9  PHOS 3.4 3.3 3.2  GLUCOSE 152* 121* 264*    CBG (last 3)   Recent Labs  11/29/12 2348 11/30/12 0353 11/30/12 0816  GLUCAP 247* 162* 173*    Scheduled Meds: . antiseptic oral rinse  15 mL Mouth Rinse QID  . ceFEPime (MAXIPIME) IV  2 g Intravenous Q24H  . chlorhexidine  15 mL Mouth/Throat BID  . digoxin  0.125 mg Intravenous Daily  . famotidine  20 mg Per Tube Daily  . feeding supplement  60 mL Oral TID WC  . free water  300 mL Per Tube Q6H  . hydrocortisone sod succinate (SOLU-CORTEF) inj  25 mg Intravenous Q12H  . insulin aspart  0-20 Units Subcutaneous Q4H  . potassium chloride  40 mEq Per Tube BID  . sodium chloride  10-40 mL Intracatheter Q12H  . warfarin  2.5 mg Oral Custom  . [START ON 12/04/2012] warfarin  5 mg Oral Q Mon-1800  . Warfarin - Pharmacist Dosing Inpatient   Does not apply q1800    Continuous Infusions: . sodium chloride 10 mL/hr at 11/29/12 0125  . amiodarone (NEXTERONE PREMIX) 360 mg/200 mL dextrose 30 mg/hr (11/24/12 2130)  . feeding supplement (OSMOLITE 1.2 CAL) 1,000 mL (11/30/12 0200)  . heparin 1,000 Units/hr (11/30/12  0981)  . norepinephrine (LEVOPHED) Adult infusion Stopped (11/27/12 0931)    Loyce Dys, MS RD LDN Clinical Inpatient Dietitian Pager: [phone] Weekend/After hours pager: [phone]

## 2012-11-30 NOTE — Progress Notes (Signed)
ANTICOAGULATION CONSULT NOTE - Follow Up Consult  Pharmacy Consult for Heparin + Coumadin Indication: atrial fibrillation  No Known Allergies  Patient Measurements: Height: 5\' 2"  (157.5 cm) Weight: 182 lb 1.6 oz (82.6 kg) IBW/kg (Calculated) : 50.1 Heparin Dosing Weight: 68kg  Vital Signs: Temp: 96.6 F (35.9 C) (07/24 0800) Temp src: Axillary (07/24 0800) BP: 118/67 mmHg (07/24 1100) Pulse Rate: 70 (07/24 1100)  Labs:  Recent Labs  11/28/12 0900 11/28/12 1733 11/29/12 0400 11/29/12 0800 11/30/12 0400 11/30/12 1200  HGB 9.2*  --  7.2*  --  8.1*  --   HCT 28.7*  --  22.8*  --  25.6*  --   PLT 142*  --  141*  --  128*  --   LABPROT 14.7  --  17.1*  --  16.7*  --   INR 1.17  --  1.43  --  1.39  --   HEPARINUNFRC <0.10*  --  0.20* 0.28* 0.72* 0.75*  CREATININE  --  1.96* 1.94*  --  2.15*  --     Estimated Creatinine Clearance: 23.6 ml/min (by C-G formula based on Cr of 2.15).   Medications:  Heparin @ 1000 units/hr  Assessment: 72yof continues on heparin to coumadin bridge for afib. Coumadin had previously been on hold (7/15-7/22) for trach/PEG and was resumed 7/23. INR today is subtherapeutic at 1.39. She had been NPO but her tube feedings were resumed after PEG placement yesterday. Heparin level remains above goal and increased despite rate decrease this morning. Hgb is improved however platelets are slightly decreased. No bleeding reported.  Home coumadin dose: 2.5mg  daily except 5mg  on Monday  Goal of Therapy:  Heparin level 0.3-0.7 units/ml (targeting lower end of range due to anemia and bleeding) INR 2-3 Monitor platelets by anticoagulation protocol: Yes   Plan:  1) Increase coumadin to 5mg  x 1 tonight 2) Decrease heparin further to 850 units/hr 3) Check 8 hour heparin level 4) Follow up heparin level, INR, CBC in AM  Fredrik Rigger 11/30/2012,1:14 PM

## 2012-11-30 NOTE — Progress Notes (Signed)
ANTICOAGULATION CONSULT NOTE - Follow Up Consult  Pharmacy Consult for heparin Indication: atrial fibrillation  Labs:  Recent Labs  11/28/12 0900 11/28/12 1733 11/29/12 0400 11/29/12 0800 11/30/12 0400  HGB 9.2*  --  7.2*  --  8.1*  HCT 28.7*  --  22.8*  --  25.6*  PLT 142*  --  141*  --  128*  LABPROT 14.7  --  17.1*  --  16.7*  INR 1.17  --  1.43  --  1.39  HEPARINUNFRC <0.10*  --  0.20* 0.28* 0.72*  CREATININE  --  1.96* 1.94*  --  2.15*    Assessment: 72yo female now slightly supratherapeutic on heparin after resumed for Afib, targeting low end of goal for anemia/bleeding.  Goal of Therapy:  Heparin level 0.3-0.7 units/ml   Plan:  Will decrease heparin gtt by 1 unit/kg/hr to 1000 units/hr and check level in 8hr.  Vernard Gambles, PharmD, BCPS  11/30/2012,4:43 AM

## 2012-12-01 LAB — GLUCOSE, CAPILLARY
Glucose-Capillary: 106 mg/dL — ABNORMAL HIGH (ref 70–99)
Glucose-Capillary: 147 mg/dL — ABNORMAL HIGH (ref 70–99)
Glucose-Capillary: 192 mg/dL — ABNORMAL HIGH (ref 70–99)
Glucose-Capillary: 221 mg/dL — ABNORMAL HIGH (ref 70–99)

## 2012-12-01 LAB — BASIC METABOLIC PANEL
BUN: 73 mg/dL — ABNORMAL HIGH (ref 6–23)
CO2: 27 mEq/L (ref 19–32)
Chloride: 116 mEq/L — ABNORMAL HIGH (ref 96–112)
Creatinine, Ser: 2.44 mg/dL — ABNORMAL HIGH (ref 0.50–1.10)
Glucose, Bld: 115 mg/dL — ABNORMAL HIGH (ref 70–99)

## 2012-12-01 LAB — CBC
HCT: 26.4 % — ABNORMAL LOW (ref 36.0–46.0)
Hemoglobin: 8.6 g/dL — ABNORMAL LOW (ref 12.0–15.0)
MCV: 83.5 fL (ref 78.0–100.0)
RDW: 18.3 % — ABNORMAL HIGH (ref 11.5–15.5)
WBC: 18.4 10*3/uL — ABNORMAL HIGH (ref 4.0–10.5)

## 2012-12-01 LAB — MAGNESIUM: Magnesium: 2.1 mg/dL (ref 1.5–2.5)

## 2012-12-01 MED ORDER — DILTIAZEM HCL 30 MG PO TABS
30.0000 mg | ORAL_TABLET | Freq: Once | ORAL | Status: AC
  Administered 2012-12-01: 30 mg via ORAL
  Filled 2012-12-01: qty 1

## 2012-12-01 MED ORDER — DILTIAZEM 12 MG/ML ORAL SUSPENSION
30.0000 mg | Freq: Four times a day (QID) | ORAL | Status: DC
  Administered 2012-12-01 – 2012-12-04 (×11): 30 mg
  Filled 2012-12-01 (×15): qty 3

## 2012-12-01 MED ORDER — POTASSIUM CHLORIDE 20 MEQ/15ML (10%) PO LIQD
40.0000 meq | Freq: Three times a day (TID) | ORAL | Status: AC
  Administered 2012-12-01 (×2): 40 meq
  Filled 2012-12-01 (×2): qty 30

## 2012-12-01 MED ORDER — POTASSIUM CHLORIDE 20 MEQ/15ML (10%) PO LIQD
40.0000 meq | Freq: Two times a day (BID) | ORAL | Status: DC
  Administered 2012-12-02 – 2012-12-08 (×13): 40 meq
  Filled 2012-12-01 (×15): qty 30

## 2012-12-01 MED ORDER — FUROSEMIDE 8 MG/ML PO SOLN
40.0000 mg | Freq: Two times a day (BID) | ORAL | Status: DC
  Administered 2012-12-01 – 2012-12-04 (×7): 40 mg
  Filled 2012-12-01 (×9): qty 5

## 2012-12-01 MED ORDER — WARFARIN SODIUM 5 MG PO TABS
5.0000 mg | ORAL_TABLET | Freq: Once | ORAL | Status: AC
  Administered 2012-12-01: 5 mg via ORAL
  Filled 2012-12-01: qty 1

## 2012-12-01 MED ORDER — FUROSEMIDE 10 MG/ML PO SOLN
40.0000 mg | Freq: Two times a day (BID) | ORAL | Status: DC
  Filled 2012-12-01 (×3): qty 4

## 2012-12-01 NOTE — Progress Notes (Signed)
Pt back on vent due to > WOB and desating.

## 2012-12-01 NOTE — Progress Notes (Signed)
RT called RN to room, pt noted to have large amount of emesis on chest and in bed. Tube feeding held, and peg attached to intermittent suction. Zofran IV given and Elink MD made aware. Pt foley catheter also leaking, foley removed and replaced with clear, yellow urine return. Will continue to monitor closely.

## 2012-12-01 NOTE — Progress Notes (Signed)
ANTICOAGULATION CONSULT NOTE - Follow Up Consult  Pharmacy Consult for Heparin + Coumadin Indication: atrial fibrillation  No Known Allergies  Patient Measurements: Height: 5\' 2"  (157.5 cm) Weight: 175 lb 14.8 oz (79.8 kg) IBW/kg (Calculated) : 50.1 Heparin Dosing Weight: 68kg  Vital Signs: Temp: 97.9 F (36.6 C) (07/25 0800) Temp src: Oral (07/25 0800) BP: 100/73 mmHg (07/25 0827) Pulse Rate: 75 (07/25 0827)  Labs:  Recent Labs  11/29/12 0400  11/30/12 0400 11/30/12 1200 12/01/12 0001 12/01/12 0520  HGB 7.2*  --  8.1*  --   --  8.6*  HCT 22.8*  --  25.6*  --   --  26.4*  PLT 141*  --  128*  --   --  149*  LABPROT 17.1*  --  16.7*  --   --  16.8*  INR 1.43  --  1.39  --   --  1.40  HEPARINUNFRC 0.20*  < > 0.72* 0.75* 0.44 0.38  CREATININE 1.94*  --  2.15*  --   --  2.44*  < > = values in this interval not displayed.  Estimated Creatinine Clearance: 20.4 ml/min (by C-G formula based on Cr of 2.44).   Medications:  Heparin 850 units/hr  Assessment: 72yof continues on heparin to coumadin bridge for afib. Coumadin had previously been held (7/15-7/22) for trach/PEG and was resumed 7/23. INR (1.4) remains subtherapeutic - will repeat Coumadin 5mg  tonight; Heparin level (0.38) is therapeutic but continues to trend down - will increase rate slightly to prevent from going subtherapeutic. - H/H and Plts improving - No significant bleeding reported - Home coumadin dose: 2.5mg  daily except 5mg  on Monday  Goal of Therapy:  Heparin level 0.3-0.7 units/ml (targeting lower end of range due to anemia and bleeding) INR 2-3 Monitor platelets by anticoagulation protocol: Yes   Plan:  1. Repeat Coumadin 5mg  po x 1 today 2. Increase heparin drip to 900 units/hr (9 ml/hr) 3. Follow-up AM INR, CBC and heparin level  Cleon Dew [phone] 12/01/2012,8:44 AM

## 2012-12-01 NOTE — Progress Notes (Signed)
SUBJECTIVE:  Trached. Opens eyes and moves all extremities.  Slightly more interactive today.     PHYSICAL EXAM Filed Vitals:   12/01/12 0400 12/01/12 0500 12/01/12 0600 12/01/12 0700  BP: 97/66 94/58 87/53  99/67  Pulse: 75 56 74 89  Temp: 97 F (36.1 C)     TempSrc: Oral     Resp: 18 16 16 16   Height:      Weight:  175 lb 14.8 oz (79.8 kg)    SpO2: 100% 100% 99% 100%   General:  No distress Lungs:  Clear Heart:  Irregular Abdomen:  Positive bowel sounds, no rebound no guarding Extremities:  Trace diffuse edema   LABS:  Results for orders placed during the hospital encounter of 11/09/12 (from the past 24 hour(s))  GLUCOSE, CAPILLARY     Status: Abnormal   Collection Time    11/30/12  8:16 AM      Result Value Range   Glucose-Capillary 173 (*) 70 - 99 mg/dL  HEPARIN LEVEL (UNFRACTIONATED)     Status: Abnormal   Collection Time    11/30/12 12:00 PM      Result Value Range   Heparin Unfractionated 0.75 (*) 0.30 - 0.70 IU/mL  GLUCOSE, CAPILLARY     Status: Abnormal   Collection Time    11/30/12 12:25 PM      Result Value Range   Glucose-Capillary 206 (*) 70 - 99 mg/dL  GLUCOSE, CAPILLARY     Status: Abnormal   Collection Time    11/30/12  4:16 PM      Result Value Range   Glucose-Capillary 195 (*) 70 - 99 mg/dL  GLUCOSE, CAPILLARY     Status: Abnormal   Collection Time    11/30/12  7:54 PM      Result Value Range   Glucose-Capillary 221 (*) 70 - 99 mg/dL  GLUCOSE, CAPILLARY     Status: Abnormal   Collection Time    11/30/12 11:59 PM      Result Value Range   Glucose-Capillary 192 (*) 70 - 99 mg/dL  HEPARIN LEVEL (UNFRACTIONATED)     Status: None   Collection Time    12/01/12 12:01 AM      Result Value Range   Heparin Unfractionated 0.44  0.30 - 0.70 IU/mL  GLUCOSE, CAPILLARY     Status: Abnormal   Collection Time    12/01/12  5:19 AM      Result Value Range   Glucose-Capillary 106 (*) 70 - 99 mg/dL  PROTIME-INR     Status: Abnormal   Collection Time      12/01/12  5:20 AM      Result Value Range   Prothrombin Time 16.8 (*) 11.6 - 15.2 seconds   INR 1.40  0.00 - 1.49  CBC     Status: Abnormal   Collection Time    12/01/12  5:20 AM      Result Value Range   WBC 18.4 (*) 4.0 - 10.5 K/uL   RBC 3.16 (*) 3.87 - 5.11 MIL/uL   Hemoglobin 8.6 (*) 12.0 - 15.0 g/dL   HCT 91.4 (*) 78.2 - 95.6 %   MCV 83.5  78.0 - 100.0 fL   MCH 27.2  26.0 - 34.0 pg   MCHC 32.6  30.0 - 36.0 g/dL   RDW 21.3 (*) 08.6 - 57.8 %   Platelets 149 (*) 150 - 400 K/uL  HEPARIN LEVEL (UNFRACTIONATED)     Status: None   Collection Time  12/01/12  5:20 AM      Result Value Range   Heparin Unfractionated 0.38  0.30 - 0.70 IU/mL  BASIC METABOLIC PANEL     Status: Abnormal   Collection Time    12/01/12  5:20 AM      Result Value Range   Sodium 150 (*) 135 - 145 mEq/L   Potassium 3.9  3.5 - 5.1 mEq/L   Chloride 116 (*) 96 - 112 mEq/L   CO2 27  19 - 32 mEq/L   Glucose, Bld 115 (*) 70 - 99 mg/dL   BUN 73 (*) 6 - 23 mg/dL   Creatinine, Ser 1.30 (*) 0.50 - 1.10 mg/dL   Calcium 86.5 (*) 8.4 - 10.5 mg/dL   GFR calc non Af Amer 19 (*) >90 mL/min   GFR calc Af Amer 22 (*) >90 mL/min  MAGNESIUM     Status: None   Collection Time    12/01/12  5:20 AM      Result Value Range   Magnesium 2.1  1.5 - 2.5 mg/dL  PHOSPHORUS     Status: None   Collection Time    12/01/12  5:20 AM      Result Value Range   Phosphorus 4.5  2.3 - 4.6 mg/dL    Intake/Output Summary (Last 24 hours) at 12/01/12 0743 Last data filed at 12/01/12 0600  Gross per 24 hour  Intake 1654.5 ml  Output   2580 ml  Net -925.5 ml    ASSESSMENT AND PLAN:  Acute on chronic diastolic congestive heart failure:   Furosemide is being held with increased Na, BUN and creat.  She will likely need more free water.   CHRONIC KIDNEY DISEASE STAGE V:  Creat increased. As above.   FIBRILLATION, ATRIAL:  Continue rate control and anticoagulation for now.  No plan for cardioversion t this time as she was off  warfarin for trach.  Restarted warfarin.  On heparin until INR greater than 2.  Dig level OK.   Acute respiratory failure with hypoxia:  Status post trach.  Plan per CCM.   AMS:  I stopped Duragesic and klonopin yesterday and she is slightly better.  Exam is nonfocal.       [name] 12/01/2012 7:43 AM

## 2012-12-01 NOTE — Progress Notes (Signed)
PULMONARY  / CRITICAL CARE MEDICINE  Name: [name] MRN: [id number] DOB: 06-25-1940    ADMISSION DATE:  11/09/2012 CONSULTATION DATE:  11/09/2012  REFERRING MD :  Preston Fleeting PRIMARY SERVICE: PCCM  CHIEF COMPLAINT:  Shortness of breath.  BRIEF PATIENT DESCRIPTION: 72 y/o female with CHF was admitted on 7/3 from the [facility] ED with acute hypoxemic respiratory failure due to a CHF exacerbation, ?Superimposed PNA.  LINES / TUBES: 7/3 ETT >>11/13/12, 11/13/12  (failed extubation immediately, ? aspirated) >> 7/17 7/4 CVC R IJ >> 7/17 7/5 A-line >> 7/18 7/17 Trach (DF) >> 7/17 Rt PICC >>   CULTURES: 7/4 U strep >> neg 7/4 legionella >> neg 7/3 mrsa PCR >> POSITIVE 7/8 mini-BAL >> yeast 7/16 sputum >> yeast  7/16 blood >> neg 7/16 urine >> yeast 50 K colonies  ANTIBIOTICS: 7/3 Ceftriaxone >>7/6 7/4 levofloxacin >>7/7 7/4 Vancomycin >>7/6, restarted 7/8 >> 7/18 7/7 Unasyn >> 7/16 7/16 cefepime >> 7/24  SIGNIFICANT EVENTS: 7/3 admission 7/07 reintubated immediately after extubation.  ? Aspiration. ABX restarted.  on Neo-Synephrine.  7/09 Able to wean fio2 and neo. Lopressor required for rate control of afib. 7/10 Off neo, but now tachycardic.  Failed SBT this AM, but RT felt it was due to sedation.  Sedation weaned 7/14 Hypotensive, vomiting, amiodarone turned off for low heart rate 7/16 Change to pressure control 7/17 Start pressors, PRBC transfusion, trach 7/18 Off pressors, PRBC transfusion  STUDIES: 7/16 CT chest >> emphysema, b/l ASD, small effusions 7/16 CT abd/pelvis >> 2.6 x 2 cm nodule in head of pancreas, bone changes suggestive of Paget's disease  SUBJECTIVE:   VITAL SIGNS: Temp:  [97 F (36.1 C)-98.8 F (37.1 C)] 97.9 F (36.6 C) (07/25 0800) Pulse Rate:  [56-102] 75 (07/25 0827) Resp:  [15-23] 15 (07/25 0827) BP: (86-164)/(51-112) 100/73 mmHg (07/25 0827) SpO2:  [94 %-100 %] 99 % (07/25 0827) FiO2 (%):  [40 %] 40 % (07/25 0827) Weight:  [79.8 kg (175 lb 14.8  oz)] 79.8 kg (175 lb 14.8 oz) (07/25 0500)  HEMODYNAMICS: CVP:  [5 mmHg-9 mmHg] 8 mmHg  VENTILATOR SETTINGS: Vent Mode:  [-] PCV FiO2 (%):  [40 %] 40 % Set Rate:  [16 bmp] 16 bmp PEEP:  [5 cmH20] 5 cmH20 Pressure Support:  [10 cmH20] 10 cmH20 Plateau Pressure:  [17 cmH20-19 cmH20] 17 cmH20  INTAKE / OUTPUT: Intake/Output     07/24 0701 - 07/25 0700 07/25 0701 - 07/26 0700   I.V. (mL/kg) 644.5 (8.1) 77 (1)   NG/GT 960    IV Piggyback 50    Total Intake(mL/kg) 1654.5 (20.7) 77 (1)   Urine (mL/kg/hr) 2380 (1.2) 500 (3.4)   Stool 200 (0.1)    Total Output 2580 500   Net -925.5 -423        Urine Occurrence 2 x    Stool Occurrence 1 x    Emesis Occurrence 1 x     PHYSICAL EXAMINATION: Gen: No distress, much calmer this AM. HEENT: NG tube out, PEG in place, trach site clean PULM: scattered rhonchi CV: irregular AB: soft, non tender GU: uterine prolapse Ext: no edema Derm: no rash Neuro: RASS 0, moves extremities, follows simple commands  LABS: CBC Recent Labs     11/29/12  0400  11/30/12  0400  12/01/12  0520  WBC  14.4*  14.3*  18.4*  HGB  7.2*  8.1*  8.6*  HCT  22.8*  25.6*  26.4*  PLT  141*  128*  149*  Coag's Recent Labs     11/29/12  0400  11/30/12  0400  12/01/12  0520  INR  1.43  1.39  1.40   BMET Recent Labs     11/29/12  0400  11/30/12  0400  12/01/12  0520  NA  153*  145  150*  K  4.4  3.8  3.9  CL  126*  115*  116*  CO2  22  23  27   BUN  54*  58*  73*  CREATININE  1.94*  2.15*  2.44*  GLUCOSE  121*  264*  115*   Electrolytes Recent Labs     11/29/12  0400  11/30/12  0400  12/01/12  0520  CALCIUM  13.0*  12.9*  12.7*  MG  1.9  1.9  2.1  PHOS  3.3  3.2  4.5   Glucose Recent Labs     11/30/12  0816  11/30/12  1225  11/30/12  1616  11/30/12  1954  11/30/12  2359  12/01/12  0519  GLUCAP  173*  206*  195*  221*  192*  106*   Imaging Dg Chest Port 1 View  11/30/2012   *RADIOLOGY REPORT*  Clinical Data: Evaluate  tracheostomy  PORTABLE CHEST - 1 VIEW  Comparison: Portable chest x-ray of 11/25/2012  Findings: The tracheostomy is unchanged in position with the tip overlying the tracheal air shadow well above the carina.  Right PICC line tip is noted to the lower SVC.  Cardiomegaly and pulmonary vascular congestion with effusions remain consistent with mild congestive heart failure.  IMPRESSION:  1.  No change in mild congestive heart failure with effusions. 2.  No change in position of tracheostomy and right central venous line.   Original Report Authenticated By: Dwyane Dee, M.D.   ASSESSMENT / PLAN:  PULMONARY A:  Acute respiratory failure 2nd to pulmonary infiltrates, resolving. P:   - Continue vent wean as tolerated. - F/u CXR today.  - PRN BD's. - Change IV lasix to PO 40 mg PO BID.  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 A fib with RVR >> amiodarone d/c due to bradycardia, hypotension. Hx of CAD, HTN, hyperlipidemia. Shock >> requiring levophed P:  - Hold lipitor for now. - Change IV lasix to PO 40 mg PO BID. - Anticoagulation managed by Cards. Warfarin restarted and heparin dose decreased. No plans for cardioversion.  - Start Cardizem PO for rate control 30 q6 hours.  RENAL A: Acute kidney injury >> renal fx stable. Hypernatremia  Hypokalemia. Hypercalcemia >> PTH 678.6 from 7/16. P:   - KVO IV fluids. - Monitor renal fx, urine outpt. - F/U electrolytes. - Replace free water. Deficit is 2.8L today - Replace electrolytes as needed.  GASTROINTESTINAL A:   Protein malnutrition. Vomiting 7/14 initially improved. Vomiting again today 7/25 Mass head of pancreas. P:   - Tube feeds held and peg placed on suction. Zofran given for nausea.  - Pepcid for SUP. - Will need further assessment of pancreatic lesion when more stable, likely in LTAC or outpatient. - Free water as ordered.  GYN A:  Uterine prolapse. P: - GYN consult called on Friday,  prolapse per GYN.  HEMATOLOGIC A:  Anemia of chronic disease and critical illness >> PRBC transfusion 7/17, 7/18. Elevated WBC likely due to steroids and stress, no signs of active infection at this time. Hg now stable. P:  - F/u CBC. - Transfuse for Hb < 7. - Continue  coumadin.  INFECTIOUS A: Initial concern for aspiration pneumonia >> has persistent pulmonary infiltrates, low grade temperature, and increasing WBC. WBC at 18.4 this AM P:   - D21/21 of Abx >> D/C cefepime. - D/Ced vancomycin 7/18. - F/U CXR this AM   ENDOCRINE A:  DM2 P:   - SSI. - D/Ced lantus until sure she can tolerate enteral feedings.  NEUROLOGIC A: Acute encephalopathy 2nd to hypoxia/hypercapnia. Muscular deconditioning. P:   - Continue fentanyl patch 100 mcg q72. - Klonipin d/c'd yesterday due to AMS, seems better today/ - PT/OT as ordered .  PEG placed. Patient needing long term weaning, ready for discharge to LTAC. Spoke with family, ready for LTAC whenever approved by insurance.  Jennifer Little PA-S  Patient seen and examined, agree with above note.  I dictated the care and orders written for this patient under my direction.  Alyson Reedy, MD [phone]

## 2012-12-01 NOTE — Progress Notes (Signed)
ANTICOAGULATION CONSULT NOTE - Follow Up Consult  Pharmacy Consult for Heparin + Coumadin Indication: atrial fibrillation  No Known Allergies  Patient Measurements: Height: 5\' 2"  (157.5 cm) Weight: 182 lb 1.6 oz (82.6 kg) IBW/kg (Calculated) : 50.1 Heparin Dosing Weight: 68kg  Vital Signs: Temp: 98.3 F (36.8 C) (07/24 2000) Temp src: Oral (07/24 2000) BP: 130/112 mmHg (07/24 2309) Pulse Rate: 76 (07/24 2309)  Labs:  Recent Labs  11/28/12 0900 11/28/12 1733 11/29/12 0400  11/30/12 0400 11/30/12 1200 12/01/12 0001  HGB 9.2*  --  7.2*  --  8.1*  --   --   HCT 28.7*  --  22.8*  --  25.6*  --   --   PLT 142*  --  141*  --  128*  --   --   LABPROT 14.7  --  17.1*  --  16.7*  --   --   INR 1.17  --  1.43  --  1.39  --   --   HEPARINUNFRC <0.10*  --  0.20*  < > 0.72* 0.75* 0.44  CREATININE  --  1.96* 1.94*  --  2.15*  --   --   < > = values in this interval not displayed.  Estimated Creatinine Clearance: 23.6 ml/min (by C-G formula based on Cr of 2.15).  Medications:  Heparin @ 1000 units/hr  Assessment: 72yof continues on heparin to coumadin bridge for afib. Coumadin had previously been on hold (7/15-7/22) for trach/PEG and was resumed 7/23. INR today is subtherapeutic at 1.39. She had been NPO but her tube feedings were resumed after PEG placement yesterday. Heparin level is now within desired goal.  No bleeding reported.  Home coumadin dose: 2.5mg  daily except 5mg  on Monday  Goal of Therapy:  Heparin level 0.3-0.7 units/ml (targeting lower end of range due to anemia and bleeding) INR 2-3 Monitor platelets by anticoagulation protocol: Yes   Plan:  1) Continue IV heparin at 850 units/hr 2) Check AM heparin level, INR, CBC  Nadara Mustard, PharmD., MS Clinical Pharmacist Pager:  [phone] Thank you for allowing pharmacy to be part of this patients care team.  12/01/2012,12:55 AM

## 2012-12-01 NOTE — Progress Notes (Signed)
Inpatient Diabetes Program Recommendations  AACE/ADA: New Consensus Statement on Inpatient Glycemic Control (2013)  Target Ranges:  Prepandial:   less than 140 mg/dL      Peak postprandial:   less than 180 mg/dL (1-2 hours)      Critically ill patients:  140 - 180 mg/dL   Reason for Assessment: Glycemic control and renal issues  Results for [name], [name] (MRN [id number]) as of 12/01/2012 09:56  Ref. Range 11/30/2012 16:16 11/30/2012 19:54 11/30/2012 23:59 12/01/2012 05:19 12/01/2012 08:21  Glucose-Capillary Latest Range: 70-99 mg/dL 409 (H) 811 (H) 914 (H) 106 (H) 129 (H)    Note: Patient still not receiving tube feeding.  Currently on Novolog resistant scale every 4 hours.  Glycemic control adequate considering renal issues.  Do not recommend Lantus unless situation changes.  Thank you.  Kimberlin Scheel S. Elsie Lincoln, RN, CNS, CDE Inpatient Diabetes Program, team pager [phone]

## 2012-12-02 DIAGNOSIS — I421 Obstructive hypertrophic cardiomyopathy: Secondary | ICD-10-CM | POA: Diagnosis present

## 2012-12-02 LAB — GLUCOSE, CAPILLARY
Glucose-Capillary: 143 mg/dL — ABNORMAL HIGH (ref 70–99)
Glucose-Capillary: 164 mg/dL — ABNORMAL HIGH (ref 70–99)
Glucose-Capillary: 172 mg/dL — ABNORMAL HIGH (ref 70–99)
Glucose-Capillary: 236 mg/dL — ABNORMAL HIGH (ref 70–99)

## 2012-12-02 LAB — BASIC METABOLIC PANEL
BUN: 76 mg/dL — ABNORMAL HIGH (ref 6–23)
CO2: 27 mEq/L (ref 19–32)
Chloride: 114 mEq/L — ABNORMAL HIGH (ref 96–112)
Creatinine, Ser: 2.64 mg/dL — ABNORMAL HIGH (ref 0.50–1.10)
GFR calc Af Amer: 20 mL/min — ABNORMAL LOW (ref >90)

## 2012-12-02 LAB — CBC
MCH: 26.9 pg (ref 26.0–34.0)
MCHC: 32 g/dL (ref 30.0–36.0)
MCV: 84.1 fL (ref 78.0–100.0)
Platelets: 139 10*3/uL — ABNORMAL LOW (ref 150–400)
RBC: 2.9 MIL/uL — ABNORMAL LOW (ref 3.87–5.11)

## 2012-12-02 MED ORDER — WARFARIN SODIUM 2.5 MG PO TABS
2.5000 mg | ORAL_TABLET | Freq: Once | ORAL | Status: AC
  Administered 2012-12-02: 2.5 mg via ORAL
  Filled 2012-12-02 (×2): qty 1

## 2012-12-02 NOTE — Progress Notes (Signed)
ANTICOAGULATION CONSULT NOTE - Follow Up Consult  Pharmacy Consult for Heparin + Coumadin Indication: atrial fibrillation  No Known Allergies  Patient Measurements: Height: 5\' 2"  (157.5 cm) Weight: 173 lb 11.6 oz (78.8 kg) IBW/kg (Calculated) : 50.1 Heparin Dosing Weight: 68kg  Vital Signs: Temp: 97 F (36.1 C) (07/26 0800) Temp src: Oral (07/26 0800) BP: 110/70 mmHg (07/26 0754) Pulse Rate: 119 (07/26 0842)  Labs:  Recent Labs  11/30/12 0400  12/01/12 0001 12/01/12 0520 12/02/12 0400  HGB 8.1*  --   --  8.6* 7.8*  HCT 25.6*  --   --  26.4* 24.4*  PLT 128*  --   --  149* 139*  LABPROT 16.7*  --   --  16.8* 21.0*  INR 1.39  --   --  1.40 1.87*  HEPARINUNFRC 0.72*  < > 0.44 0.38 0.48  CREATININE 2.15*  --   --  2.44* 2.64*  < > = values in this interval not displayed.  Estimated Creatinine Clearance: 18.7 ml/min (by C-G formula based on Cr of 2.64).   Medications:  Heparin 900 units/hr  Assessment: 72yof continues on heparin to coumadin bridge for afib. Coumadin had previously been held (7/15-7/22) for trach/PEG and was resumed 7/23. INR (1.87) remains subtherapeutic but jumped overnight - will decrease dose back to PTA regimen (2.5mg  daily except 5mg  on Mondays); Heparin level (0.48) is therapeutic - continue current rate and follow-up AM level.  - H/H and Plts trending down - No significant bleeding reported  Goal of Therapy:  Heparin level 0.3-0.7 units/ml  INR 2-3 Monitor platelets by anticoagulation protocol: Yes   Plan:  1. Coumadin 2.5mg  po x 1 today 2. Continue heparin drip 900 units/hr (9 ml/hr) 3. Follow-up AM INR, CBC and heparin level  Cleon Dew [phone] 12/02/2012,8:45 AM

## 2012-12-02 NOTE — Progress Notes (Signed)
Patient not tolerating TC @ 40%, has increased RR,afib with RVR 120-150s sats 85% placed back on the Vent

## 2012-12-02 NOTE — Progress Notes (Signed)
PULMONARY  / CRITICAL CARE MEDICINE  Name: RANEY ANTWINE MRN: [id number] DOB: [birth year]    ADMISSION DATE:  11/09/2012 CONSULTATION DATE:  11/09/2012  REFERRING MD :  Preston Fleeting PRIMARY SERVICE: PCCM  CHIEF COMPLAINT:  Shortness of breath.  BRIEF PATIENT DESCRIPTION: 72 y/o female with CHF was admitted on 7/3 from the [facility] ED with acute hypoxemic respiratory failure due to a CHF exacerbation, ?Superimposed PNA.  LINES / TUBES: 7/3 ETT >>11/13/12, 11/13/12  (failed extubation immediately, ? aspirated) >> 7/17 7/4 CVC R IJ >> 7/17 7/5 A-line >> 7/18 7/17 Trach (DF) >> 7/17 Rt PICC >>   CULTURES: 7/4 U strep >> neg 7/4 legionella >> neg 7/3 mrsa PCR >> POSITIVE 7/8 mini-BAL >> yeast 7/16 sputum >> yeast  7/16 blood >> neg 7/16 urine >> yeast 50 K colonies  ANTIBIOTICS: 7/3 Ceftriaxone >>7/6 7/4 levofloxacin >>7/7 7/4 Vancomycin >>7/6, restarted 7/8 >> 7/18 7/7 Unasyn >> 7/16 7/16 cefepime >> 7/24  SIGNIFICANT EVENTS: 7/3 admission 7/07 reintubated immediately after extubation.  ? Aspiration. ABX restarted.  on Neo-Synephrine.  7/09 Able to wean fio2 and neo. Lopressor required for rate control of afib. 7/10 Off neo, but now tachycardic.  Failed SBT this AM, but RT felt it was due to sedation.  Sedation weaned 7/14 Hypotensive, vomiting, amiodarone turned off for low heart rate 7/16 Change to pressure control 7/17 Start pressors, PRBC transfusion, trach 7/18 Off pressors, PRBC transfusion  STUDIES: 7/16 CT chest >> emphysema, b/l ASD, small effusions 7/16 CT abd/pelvis >> 2.6 x 2 cm nodule in head of pancreas, bone changes suggestive of Paget's disease  SUBJECTIVE: No reported new concerns. Failed to toleratre 40% trach collar-back on vent.  VITAL SIGNS: Temp:  [97 F (36.1 C)-98.7 F (37.1 C)] 97 F (36.1 C) (07/26 0800) Pulse Rate:  [57-119] 119 (07/26 0844) Resp:  [13-32] 17 (07/26 0844) BP: (81-124)/(49-74) 91/57 mmHg (07/26 0844) SpO2:  [90 %-100 %] 93 % (07/26  0844) FiO2 (%):  [40 %] 40 % (07/26 0842) Weight:  [78.8 kg (173 lb 11.6 oz)] 78.8 kg (173 lb 11.6 oz) (07/26 0600)  HEMODYNAMICS: CVP:  [4 mmHg-10 mmHg] 6 mmHg  VENTILATOR SETTINGS: Vent Mode:  [-] PCV FiO2 (%):  [40 %] 40 % Set Rate:  [16 bmp] 16 bmp PEEP:  [5 cmH20] 5 cmH20 Pressure Support:  [5 cmH20] 5 cmH20 Plateau Pressure:  [15 cmH20-21 cmH20] 21 cmH20  INTAKE / OUTPUT: Intake/Output     07/25 0701 - 07/26 0700 07/26 0701 - 07/27 0700   I.V. (mL/kg) 709.7 (9) 29 (0.4)   Other 300    NG/GT 840 50   IV Piggyback     Total Intake(mL/kg) 1849.7 (23.5) 79 (1)   Urine (mL/kg/hr) 3025 (1.6) 250 (1.2)   Stool 200 (0.1)    Total Output 3225 250   Net -1375.4 -171         PHYSICAL EXAMINATION: Gen: No distress, somnolent.  HEENT: NG tube out, PEG in place, trach site clean, # 6 PULM:  Few scattered rhonchi, vent CV: wide complex AF nearly regular AB: soft, non tender GU: uterine prolapse Ext: no edema Derm: no rash Neuro: RASS 0, moves extremities, somnolent  LABS: CBC Recent Labs     11/30/12  0400  12/01/12  0520  12/02/12  0400  WBC  14.3*  18.4*  15.4*  HGB  8.1*  8.6*  7.8*  HCT  25.6*  26.4*  24.4*  PLT  128*  149*  139*  Coag's Recent Labs     11/30/12  0400  12/01/12  0520  12/02/12  0400  INR  1.39  1.40  1.87*   BMET Recent Labs     11/30/12  0400  12/01/12  0520  12/02/12  0400  NA  145  150*  149*  K  3.8  3.9  4.6  CL  115*  116*  114*  CO2  23  27  27   BUN  58*  73*  76*  CREATININE  2.15*  2.44*  2.64*  GLUCOSE  264*  115*  162*   Electrolytes Recent Labs     11/30/12  0400  12/01/12  0520  12/02/12  0400  CALCIUM  12.9*  12.7*  12.7*  MG  1.9  2.1  2.1  PHOS  3.2  4.5  4.1   Glucose Recent Labs     12/01/12  0821  12/01/12  1158  12/01/12  2007  12/02/12  0015  12/02/12  0404  12/02/12  0801  GLUCAP  129*  147*  184*  106*  143*  164*   Imaging No results found. ASSESSMENT / PLAN:  PULMONARY A:  Acute  respiratory failure 2nd to pulmonary infiltrates, resolving. P:   - Continue vent wean as tolerated. - F/u CXR today.  - PRN BD's. - Change IV lasix to PO 40 mg PO BID.  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 A fib with RVR >> amiodarone d/c due to bradycardia, hypotension. Hx of CAD, HTN, hyperlipidemia. Shock >> requiring levophed P:  - Hold lipitor for now. - Change IV lasix to PO 40 mg PO BID. - Anticoagulation managed by Cards. Warfarin restarted and heparin dose decreased. No plans for cardioversion.  - Start Cardizem PO for rate control 30 q6 hours.  RENAL A: Acute kidney injury >> renal fx stable. Hypernatremia  Hypokalemia. Hypercalcemia >> PTH 678.6 from 7/16. P:   - KVO IV fluids. - Monitor renal fx, urine outpt. - F/U electrolytes. - Replace free water. Deficit is 2.8L today - Replace electrolytes as needed.  GASTROINTESTINAL A:   Protein malnutrition. Vomiting 7/14 initially improved. Vomiting again today 7/25 Mass head of pancreas. P:   - Tube feeds held and peg placed on suction. Zofran given for nausea.  - Pepcid for SUP. - Will need further assessment of pancreatic lesion when more stable, likely in LTAC or outpatient. - Free water as ordered.  GYN A:  Uterine prolapse. P: - GYN consult called on Friday, prolapse per GYN.  HEMATOLOGIC A:  Anemia of chronic disease and critical illness >> PRBC transfusion 7/17, 7/18. Elevated WBC likely due to steroids and stress, no signs of active infection at this time. Hg now stable. P:  - F/u CBC. - Transfuse for Hb < 7. - Continue coumadin.  INFECTIOUS A: Initial concern for aspiration pneumonia >> has persistent pulmonary infiltrates, low grade temperature, and increasing WBC. WBC at 18.4 this AM P:   - D21/21 of Abx >> D/C cefepime. - D/Ced vancomycin 7/18. - F/U CXR this AM   ENDOCRINE A:  DM2 P:   - SSI. - D/Ced lantus until sure she can tolerate enteral  feedings.  NEUROLOGIC A: Acute encephalopathy 2nd to hypoxia/hypercapnia. Muscular deconditioning. P:   - Continue fentanyl patch 100 mcg q72. - Klonipin d/c'd  due to AMS - PT/OT as ordered  -Watch need to reduce sedation.  PEG placed. Patient needing long term weaning,  ready for discharge to LTAC. Spoke with family, ready for LTAC whenever approved by insurance.  Jennifer Little PA-S   Waymon Budge, MD PCCM [phone] After 5:00PM [phone]

## 2012-12-02 NOTE — Progress Notes (Signed)
[name]  72 y.o.  female  Subjective: Intubated  Allergy: Review of patient's allergies indicates no known allergies.  Objective: Vital signs in last 24 hours: Temp:  [97 F (36.1 C)-98.7 F (37.1 C)] 97 F (36.1 C) (07/26 0800) Pulse Rate:  [57-119] 72 (07/26 1000) Resp:  [13-32] 17 (07/26 1000) BP: (81-124)/(49-74) 109/59 mmHg (07/26 1000) SpO2:  [90 %-100 %] 97 % (07/26 1000) FiO2 (%):  [40 %] 40 % (07/26 1000) Weight:  [78.8 kg (173 lb 11.6 oz)] 78.8 kg (173 lb 11.6 oz) (07/26 0600)  78.8 kg (173 lb 11.6 oz) Body mass index is 31.77 kg/(m^2).  Weight change: -1 kg (-2 lb 3.3 oz) Last BM Date: 12/01/12  Intake/Output from previous day: 07/25 0701 - 07/26 0700 In: 1849.7 [I.V.:709.7; NG/GT:840] Out: 3225 [Urine:3025; Stool:200] Total I/O since admission: + 6.5 L , but net diuresis of 6 L over the past 4 days Weight: Increased 4 kg relative to admission, but 8 kg less than peak weight on 7/22;  Patient's stable weight had been 165-175; subsequently increased to 191 pounds, and now back to 174.  General- Well developed; no acute distress  Neck- tracheostomy,  Lungs- clear lung fields; normal I:E ratio Cardiovascular- normal PMI; normal S1 and S2; G 2-3/6 SEM at the ULSB Abdomen- normal bowel sounds; soft and non-tender without masses or organomegaly Skin- Warm, no significant lesions Extremities- Nl distal pulses; no edema  Lab Results: CBC:   Recent Labs  12/01/12 0520 12/02/12 0400  WBC 18.4* 15.4*  HGB 8.6* 7.8*  HCT 26.4* 24.4*  PLT 149* 139*   BMET:  Recent Labs  12/01/12 0520 12/02/12 0400  NA 150* 149*  K 3.9 4.6  CL 116* 114*  CO2 27 27  GLUCOSE 115* 162*  BUN 73* 76*  CREATININE 2.44* 2.64*  CALCIUM 12.7* 12.7*   ProBNP level: 9817 on 11/22/12, substantially increased from a value of 1008 on 10/31/12  EKG: AF; RVR; LBBB Telemetry: AF, IVCD, controlled ventricular rate.  Echocardiogram: 09/01/12; moderate left atrial enlargement; severe  ASH, EF of 65-70%, LVOT/mid cavity dynamic outflow obstruction   Imaging: Imaging results have been reviewed  Medications:  I have reviewed the patient's current medications. Scheduled: . antiseptic oral rinse  15 mL Mouth Rinse QID  . chlorhexidine  15 mL Mouth/Throat BID  . digoxin  0.125 mg Intravenous Daily  . diltiazem  30 mg Per Tube Q6H  . famotidine  20 mg Per Tube Daily  . feeding supplement  60 mL Oral TID WC  . free water  300 mL Per Tube Q6H  . furosemide  40 mg Per Tube BID  . hydrocortisone sod succinate (SOLU-CORTEF) inj  25 mg Intravenous Q12H  . insulin aspart  0-20 Units Subcutaneous Q4H  . potassium chloride  40 mEq Per Tube BID  . sodium chloride  10-40 mL Intracatheter Q12H  . warfarin  2.5 mg Oral ONCE-1800  . Warfarin - Pharmacist Dosing Inpatient   Does not apply q1800   Infusions:   . amiodarone (NEXTERONE PREMIX) 360 mg/200 mL dextrose 30 mg/hr (11/24/12 2130)  . feeding supplement (OSMOLITE 1.2 CAL) 1,000 mL (12/01/12 1600)  . heparin 900 Units/hr (12/02/12 0238)  Assessment/Plan: Hypotension: Blood pressure improved since 7/25  Diastolic congestive heart failure: Weight decreased from a peak of 191 lbs to 169, which is close to her baseline.  Agree with plan to continue gentle diuresis as tolerated.  Atrial fibrillation: Heart rate currently adequately controlled  LOS: 23 days   [place] Bing , M.D.  12/02/2012, 10:44 AM

## 2012-12-03 ENCOUNTER — Inpatient Hospital Stay (HOSPITAL_COMMUNITY): Payer: Medicare Other

## 2012-12-03 LAB — CBC
MCHC: 31.8 g/dL (ref 30.0–36.0)
RDW: 18.1 % — ABNORMAL HIGH (ref 11.5–15.5)
WBC: 13.4 10*3/uL — ABNORMAL HIGH (ref 4.0–10.5)

## 2012-12-03 LAB — PROTIME-INR
INR: 2.15 — ABNORMAL HIGH (ref 0.00–1.49)
Prothrombin Time: 23.3 seconds — ABNORMAL HIGH (ref 11.6–15.2)

## 2012-12-03 LAB — HEPARIN LEVEL (UNFRACTIONATED): Heparin Unfractionated: 0.24 IU/mL — ABNORMAL LOW (ref 0.30–0.70)

## 2012-12-03 LAB — CLOSTRIDIUM DIFFICILE BY PCR: Toxigenic C. Difficile by PCR: NEGATIVE

## 2012-12-03 MED ORDER — WARFARIN SODIUM 2.5 MG PO TABS
2.5000 mg | ORAL_TABLET | Freq: Every day | ORAL | Status: DC
  Administered 2012-12-03 – 2012-12-07 (×5): 2.5 mg via ORAL
  Filled 2012-12-03 (×6): qty 1

## 2012-12-03 MED ORDER — DIGOXIN 0.25 MG/ML IJ SOLN
0.1250 mg | INTRAMUSCULAR | Status: DC
  Administered 2012-12-03: 0.125 mg via INTRAVENOUS
  Filled 2012-12-03 (×2): qty 0.5

## 2012-12-03 NOTE — Progress Notes (Signed)
[name]  72 y.o.  female  Subjective: Intubated  Allergy: Review of patient's allergies indicates no known allergies.  Objective: Vital signs in last 24 hours: Temp:  [97 F (36.1 C)-98.8 F (37.1 C)] 97 F (36.1 C) (07/27 0748) Pulse Rate:  [61-132] 86 (07/27 1000) Resp:  [16-35] 23 (07/27 1000) BP: (96-137)/(55-80) 96/67 mmHg (07/27 1000) SpO2:  [86 %-100 %] 94 % (07/27 1000) FiO2 (%):  [40 %] 40 % (07/27 1000) Weight:  [76.6 kg (168 lb 14 oz)] 76.6 kg (168 lb 14 oz) (07/27 0500)  76.6 kg (168 lb 14 oz) Body mass index is 30.88 kg/(m^2).  Weight change: -2.2 kg (-4 lb 13.6 oz) Last BM Date: 12/02/12  Intake/Output from previous day: 07/26 0701 - 07/27 0700 In: 2021.9 [I.V.:691.9; NG/GT:1330] Out: 3000 [Urine:3000] Total I/O since admission: + 6.1 L , but net diuresis of 6+ L over the past 4 days Weight: 76.6, decreased 2 kg since 7/26  General- Well developed; no acute distress  Neck- tracheostomy,  Lungs- clear lung fields; normal I:E ratio Cardiovascular- normal PMI; normal S1 and S2; G 2-3/6 SEM at the ULSB Abdomen- normal bowel sounds; soft and non-tender without masses or organomegaly Skin- Warm, no significant lesions Extremities- Nl distal pulses; no edema  Lab Results: CBC:    Recent Labs  12/02/12 0400 12/03/12 0425  WBC 15.4* 13.4*  HGB 7.8* 7.7*  HCT 24.4* 24.2*  PLT 139* 128*   BMET:   Recent Labs  12/01/12 0520 12/02/12 0400  NA 150* 149*  K 3.9 4.6  CL 116* 114*  CO2 27 27  GLUCOSE 115* 162*  BUN 73* 76*  CREATININE 2.44* 2.64*  CALCIUM 12.7* 12.7*  ProBNP level: 9817 on 11/22/12, substantially increased from a value of 1008 on 10/31/12; further increased to 27,000 on 7/26  CXR: 12/03/12   1. Support apparatus, as above.  2. Patchy multifocal interstitial and airspace disease  asymmetrically distributed throughout the lungs bilaterally,  concerning for multilobar pneumonia.  3. Moderate bilateral pleural effusions.  4. Mild  cardiomegaly.  EKG: AF; RVR; LBBB Telemetry: AF, IVCD, generally controlled ventricular rate with episodic accelerations in excess of 120 bpm.  Echocardiogram: 09/01/12; moderate left atrial enlargement; severe ASH, EF of 65-70%, LVOT/mid cavity dynamic outflow obstruction   Imaging: Imaging results have been reviewed  Medications:  I have reviewed the patient's current medications. Scheduled: . antiseptic oral rinse  15 mL Mouth Rinse QID  . chlorhexidine  15 mL Mouth/Throat BID  . digoxin  0.125 mg Intravenous Q M,W,F,S,S -1800  . diltiazem  30 mg Per Tube Q6H  . famotidine  20 mg Per Tube Daily  . feeding supplement  60 mL Oral TID WC  . free water  300 mL Per Tube Q6H  . furosemide  40 mg Per Tube BID  . hydrocortisone sod succinate (SOLU-CORTEF) inj  25 mg Intravenous Q12H  . insulin aspart  0-20 Units Subcutaneous Q4H  . potassium chloride  40 mEq Per Tube BID  . sodium chloride  10-40 mL Intracatheter Q12H  . warfarin  2.5 mg Oral q1800  . Warfarin - Pharmacist Dosing Inpatient   Does not apply q1800   Infusions:   . amiodarone (NEXTERONE PREMIX) 360 mg/200 mL dextrose 30 mg/hr (11/24/12 2130)  . feeding supplement (OSMOLITE 1.2 CAL) 1,000 mL (12/01/12 1600)  . heparin 900 Units/hr (12/02/12 0238)  Assessment/Plan: Hypotension: Blood pressure improved since 7/25; she may be able to tolerate additional beta blocker or  calcium channel antagonist if required.  Diastolic congestive heart failure: Weight decreased from a peak of 191 lbs to 169, which is close to her baseline.  Despite impressive diuresis, radiographic signs have not improved, and BNP continues to increase. An echocardiogram will be repeated to assess the status of ventricular size and left ventricular outflow tract obstruction. Continue diuresis for now.  Atrial fibrillation: Heart rate currently adequately controlled.  Anticoagulation: Therapeutic on warfarin   LOS: 24 days   [place] Bing , M.D.   12/03/2012, 11:07 AM

## 2012-12-03 NOTE — Progress Notes (Signed)
Pt unable to wean on TC 40% sat 85-88% with increased WOB. Afib with RVR 120-160`s. Placed back on the vent.

## 2012-12-03 NOTE — Progress Notes (Signed)
Heparin gtt d/c'd per Jonny Ruiz Pharmacist's orders

## 2012-12-03 NOTE — Progress Notes (Signed)
PULMONARY  / CRITICAL CARE MEDICINE  Name: [name] MRN: [id number] DOB: April 27, 1941    ADMISSION DATE:  11/09/2012 CONSULTATION DATE:  11/09/2012  REFERRING MD :  Preston Fleeting PRIMARY SERVICE: PCCM  CHIEF COMPLAINT:  Shortness of breath.  BRIEF PATIENT DESCRIPTION: 72 y/o female with CHF was admitted on 7/3 from the [facility] ED with acute hypoxemic respiratory failure due to a CHF exacerbation, ?Superimposed PNA.  LINES / TUBES: 7/3 ETT >>11/13/12, 11/13/12  (failed extubation immediately, ? aspirated) >> 7/17 7/4 CVC R IJ >> 7/17 7/5 A-line >> 7/18 7/17 Trach (DF) >> 7/17 Rt PICC >>   CULTURES: 7/4 U strep >> neg 7/4 legionella >> neg 7/3 mrsa PCR >> POSITIVE 7/8 mini-BAL >> yeast 7/16 sputum >> yeast  7/16 blood >> neg 7/16 urine >> yeast 50 K colonies  ANTIBIOTICS: 7/3 Ceftriaxone >>7/6 7/4 levofloxacin >>7/7 7/4 Vancomycin >>7/6, restarted 7/8 >> 7/18 7/7 Unasyn >> 7/16 7/16 cefepime >> 7/24  SIGNIFICANT EVENTS: 7/3 admission 7/07 reintubated immediately after extubation.  ? Aspiration. ABX restarted.  on Neo-Synephrine.  7/09 Able to wean fio2 and neo. Lopressor required for rate control of afib. 7/10 Off neo, but now tachycardic.  Failed SBT this AM, but RT felt it was due to sedation.  Sedation weaned 7/14 Hypotensive, vomiting, amiodarone turned off for low heart rate 7/16 Change to pressure control 7/17 Start pressors, PRBC transfusion, trach 7/18 Off pressors, PRBC transfusion  STUDIES: 7/16 CT chest >> emphysema, b/l ASD, small effusions 7/16 CT abd/pelvis >> 2.6 x 2 cm nodule in head of pancreas, bone changes suggestive of Paget's disease  SUBJECTIVE: Tolerates short intervals Tcollar.  Now has diarrhea soiling her vaginal prolapse. Cdiff assay in process. Reviewed GYN 7/18 consult note w/ nurse and discussed rectal tube for hygiene  VITAL SIGNS: Temp:  [97 F (36.1 C)-98.8 F (37.1 C)] 97 F (36.1 C) (07/27 0748) Pulse Rate:  [61-119] 84 (07/27 0748) Resp:   [16-25] 25 (07/27 0748) BP: (91-137)/(55-80) 117/76 mmHg (07/27 0403) SpO2:  [93 %-100 %] 100 % (07/27 0748) FiO2 (%):  [40 %] 40 % (07/27 0403) Weight:  [76.6 kg (168 lb 14 oz)] 76.6 kg (168 lb 14 oz) (07/27 0500)  HEMODYNAMICS: CVP:  [6 mmHg-14 mmHg] 14 mmHg  VENTILATOR SETTINGS: Vent Mode:  [-] PCV FiO2 (%):  [40 %] 40 % Set Rate:  [16 bmp] 16 bmp PEEP:  [5 cmH20] 5 cmH20 Plateau Pressure:  [16 cmH20-22 cmH20] 17 cmH20  INTAKE / OUTPUT: Intake/Output     07/26 0701 - 07/27 0700 07/27 0701 - 07/28 0700   I.V. (mL/kg) 691.9 (9) 10 (0.1)   Other     NG/GT 1330 10   Total Intake(mL/kg) 2021.9 (26.4) 20 (0.3)   Urine (mL/kg/hr) 3000 (1.6) 450 (3.9)   Stool     Total Output 3000 450   Net -978.1 -430        Stool Occurrence 3 x     PHYSICAL EXAMINATION: Gen: No distress, more awake  HEENT: NG tube out, PEG in place, trach site clean, # 6 PULM:  Few scattered rhonchi, Tcollar CV:  AF nearly regular AB: soft, non tender GU: vaginal prolapse Ext: no edema Derm: no rash Neuro: RASS 0, moves extremities, not very interactive  LABS: CBC Recent Labs     12/01/12  0520  12/02/12  0400  12/03/12  0425  WBC  18.4*  15.4*  13.4*  HGB  8.6*  7.8*  7.7*  HCT  26.4*  24.4*  24.2*  PLT  149*  139*  128*   Coag's Recent Labs     12/01/12  0520  12/02/12  0400  12/03/12  0425  INR  1.40  1.87*  2.15*   BMET Recent Labs     12/01/12  0520  12/02/12  0400  NA  150*  149*  K  3.9  4.6  CL  116*  114*  CO2  27  27  BUN  73*  76*  CREATININE  2.44*  2.64*  GLUCOSE  115*  162*   Electrolytes Recent Labs     12/01/12  0520  12/02/12  0400  CALCIUM  12.7*  12.7*  MG  2.1  2.1  PHOS  4.5  4.1   Glucose Recent Labs     12/02/12  1147  12/02/12  1557  12/02/12  1948  12/02/12  2348  12/03/12  0430  12/03/12  0746  GLUCAP  175*  172*  236*  231*  130*  103*   Imaging No results found. ASSESSMENT / PLAN:  PULMONARY A:  Acute respiratory failure 2nd  to pulmonary infiltrates, resolving. P:   - Continue vent wean as tolerated. - PRN BD's. - Change IV lasix to PO 40 mg PO BID.  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 A fib with RVR >> amiodarone d/c due to bradycardia, hypotension. Hx of CAD, HTN, hyperlipidemia. Shock >> requiring levophed P:  - Hold lipitor for now. - continue lasix to PO 40 mg PO BID. - Anticoagulation managed by Cards. Warfarin restarted and heparin dose decreased. No plans for cardioversion.  - Cardizem PO for rate control 30 q6 hours.  RENAL A: Acute kidney injury >> renal fx Cr 2.64 Hypernatremia  Hypokalemia. Hypercalcemia >> PTH 678.6 from 7/16. P:   - KVO IV fluids. - Monitor renal fx, urine outpt. - F/U electrolytes. - Replace free water.  - Replace electrolytes as needed.  GASTROINTESTINAL A:   Protein malnutrition. PEG Vomiting 7/14 initially improved. Vomiting again today 7/25 Mass head of pancreas. Diarrhea 7/26- Cdiff assay in process- soiling her vaginal prolapse.  Reviewed GYN not of 7/18- not candidate for surgery or pessary. Will try rectal tube for hygiene. P:   -Rectal tube 7/27 - Pepcid for SUP. - Will need further assessment of pancreatic lesion when more stable, likely in LTAC or outpatient. - Free water as ordered.  GYN A:  Vaginal prolapse. P: - GYN consult called 7/18,  Vaginal prolapse per GYN- not candidate for surgery or pessary.Marland Kitchen  HEMATOLOGIC A:  Anemia of chronic disease and critical illness >> PRBC transfusion 7/17, 7/18. Elevated WBC likely due to steroids and stress, no signs of active infection at this time. Hg now stable. P:  - F/u CBC. - Transfuse for Hb < 7. - Continue coumadin.  INFECTIOUS A: Initial concern for aspiration pneumonia >> has persistent pulmonary infiltrates, low grade temperature, and increasing WBC. WBC at 18.4 this AM P:   - D21/21 of Abx >> D/C cefepime. - D/Ced vancomycin 7/18. - F/U CXR  7/28  ENDOCRINE A:  DM2 P:   - SSI. - D/Ced lantus until sure she can tolerate enteral feedings.  NEUROLOGIC A: Acute encephalopathy 2nd to hypoxia/hypercapnia. Muscular deconditioning. P:   - Continue fentanyl patch 100 mcg q72. - Klonipin d/c'd  due to AMS - PT/OT as ordered  -Watch need to reduce sedation.  PEG placed. Patient needing long term weaning, ready  for discharge to LTAC. Spoke with family, ready for LTAC whenever approved by insurance.     Waymon Budge, MD PCCM [phone] After 5:00PM [phone]

## 2012-12-03 NOTE — Progress Notes (Addendum)
ANTICOAGULATION CONSULT NOTE  Pharmacy Consult for  Coumadin Indication: atrial fibrillation  No Known Allergies  Patient Measurements: Height: 5\' 2"  (157.5 cm) Weight: 168 lb 14 oz (76.6 kg) IBW/kg (Calculated) : 50.1 Heparin Dosing Weight: 68kg  Vital Signs: Temp: 98.6 F (37 C) (07/26 2346) Temp src: Oral (07/26 2346) BP: 117/76 mmHg (07/27 0403) Pulse Rate: 61 (07/27 0403)  Labs:  Recent Labs  12/01/12 0520 12/02/12 0400 12/03/12 0425  HGB 8.6* 7.8* 7.7*  HCT 26.4* 24.4* 24.2*  PLT 149* 139* 128*  LABPROT 16.8* 21.0* 23.3*  INR 1.40 1.87* 2.15*  HEPARINUNFRC 0.38 0.48 0.24*  CREATININE 2.44* 2.64*  --     Estimated Creatinine Clearance: 18.5 ml/min (by C-G formula based on Cr of 2.64).  Assessment: 72 yo female with Afib for anticoagulation.   Goal of Therapy:  INR 2-3 Monitor platelets by anticoagulation protocol: Yes   Plan:  D/C heparin as INR > 2 Coumadin 2.5 mg daily  Mathayus Stanbery, Gary Fleet 12/03/2012,5:37 AM

## 2012-12-04 DIAGNOSIS — I5031 Acute diastolic (congestive) heart failure: Secondary | ICD-10-CM

## 2012-12-04 DIAGNOSIS — I369 Nonrheumatic tricuspid valve disorder, unspecified: Secondary | ICD-10-CM

## 2012-12-04 LAB — BASIC METABOLIC PANEL
BUN: 70 mg/dL — ABNORMAL HIGH (ref 6–23)
CO2: 26 mEq/L (ref 19–32)
Chloride: 113 mEq/L — ABNORMAL HIGH (ref 96–112)
Chloride: 112 mEq/L (ref 96–112)
GFR calc Af Amer: 25 mL/min — ABNORMAL LOW (ref >90)
GFR calc Af Amer: 26 mL/min — ABNORMAL LOW (ref >90)
GFR calc non Af Amer: 22 mL/min — ABNORMAL LOW (ref >90)
Potassium: 3.7 mEq/L (ref 3.5–5.1)
Potassium: 3.8 mEq/L (ref 3.5–5.1)
Sodium: 145 mEq/L (ref 135–145)

## 2012-12-04 LAB — CANCER ANTIGEN 19-9: CA 19-9: 73.1 U/mL — ABNORMAL HIGH (ref <35.0)

## 2012-12-04 LAB — CBC WITH DIFFERENTIAL/PLATELET
Basophils Relative: 0 % (ref 0–1)
HCT: 24.4 % — ABNORMAL LOW (ref 36.0–46.0)
Hemoglobin: 8 g/dL — ABNORMAL LOW (ref 12.0–15.0)
Lymphocytes Relative: 5 % — ABNORMAL LOW (ref 12–46)
Lymphs Abs: 0.7 10*3/uL (ref 0.7–4.0)
Monocytes Relative: 5 % (ref 3–12)
Neutro Abs: 13.2 10*3/uL — ABNORMAL HIGH (ref 1.7–7.7)
Neutrophils Relative %: 89 % — ABNORMAL HIGH (ref 43–77)
RBC: 2.93 MIL/uL — ABNORMAL LOW (ref 3.87–5.11)
WBC: 14.9 10*3/uL — ABNORMAL HIGH (ref 4.0–10.5)

## 2012-12-04 LAB — GLUCOSE, CAPILLARY
Glucose-Capillary: 119 mg/dL — ABNORMAL HIGH (ref 70–99)
Glucose-Capillary: 163 mg/dL — ABNORMAL HIGH (ref 70–99)
Glucose-Capillary: 181 mg/dL — ABNORMAL HIGH (ref 70–99)
Glucose-Capillary: 177 mg/dL — ABNORMAL HIGH (ref 70–99)

## 2012-12-04 LAB — PROTIME-INR
INR: 2.2 — ABNORMAL HIGH (ref 0.00–1.49)
Prothrombin Time: 23.7 seconds — ABNORMAL HIGH (ref 11.6–15.2)

## 2012-12-04 MED ORDER — FUROSEMIDE 10 MG/ML IJ SOLN
40.0000 mg | Freq: Two times a day (BID) | INTRAMUSCULAR | Status: DC
  Administered 2012-12-04 (×2): 40 mg via INTRAVENOUS
  Filled 2012-12-04 (×4): qty 4

## 2012-12-04 MED ORDER — DIGOXIN 0.05 MG/ML PO SOLN
0.1250 mg | ORAL | Status: DC
  Administered 2012-12-04 – 2012-12-13 (×7): 0.125 mg
  Filled 2012-12-04 (×8): qty 2.5

## 2012-12-04 MED ORDER — DILTIAZEM 12 MG/ML ORAL SUSPENSION
60.0000 mg | Freq: Four times a day (QID) | ORAL | Status: DC
  Administered 2012-12-04 – 2012-12-15 (×40): 60 mg
  Filled 2012-12-04 (×50): qty 6

## 2012-12-04 NOTE — Progress Notes (Signed)
Subjective: Patient with trach color  Nods head occasionally Objective: Filed Vitals:   12/04/12 0500 12/04/12 0828 12/04/12 0904 12/04/12 0911  BP:  119/64 119/64   Pulse:  69 79 69  Temp:  98.7 F (37.1 C)    TempSrc:  Oral    Resp:  14 19 19   Height:      Weight: 167 lb 12.3 oz (76.1 kg)     SpO2:  100% 100% 100%   Weight change: -1 lb 1.6 oz (-0.5 kg)  Intake/Output Summary (Last 24 hours) at 12/04/12 1023 Last data filed at 12/04/12 0935  Gross per 24 hour  Intake   1440 ml  Output   3525 ml  Net  -2085 ml    General: Patient with trach.  Nods to question Neck: Unable to assess JVP   Heart: Irregular rate and rhythm, without murmurs, rubs, gallops.  Lungs: Coarse BS  Frothy pink out of trach Exemities:  No edema.   Neuro: Grossly intact, nonfocal.  Tele:  Afib  60s to 100s  Lab Results: Results for orders placed during the hospital encounter of 11/09/12 (from the past 24 hour(s))  GLUCOSE, CAPILLARY     Status: Abnormal   Collection Time    12/03/12 11:51 AM      Result Value Range   Glucose-Capillary 225 (*) 70 - 99 mg/dL  GLUCOSE, CAPILLARY     Status: Abnormal   Collection Time    12/03/12  4:04 PM      Result Value Range   Glucose-Capillary 153 (*) 70 - 99 mg/dL  GLUCOSE, CAPILLARY     Status: Abnormal   Collection Time    12/03/12  8:05 PM      Result Value Range   Glucose-Capillary 170 (*) 70 - 99 mg/dL  GLUCOSE, CAPILLARY     Status: Abnormal   Collection Time    12/03/12 11:34 PM      Result Value Range   Glucose-Capillary 181 (*) 70 - 99 mg/dL  PROTIME-INR     Status: Abnormal   Collection Time    12/04/12  4:00 AM      Result Value Range   Prothrombin Time 23.7 (*) 11.6 - 15.2 seconds   INR 2.20 (*) 0.00 - 1.49  CBC WITH DIFFERENTIAL     Status: Abnormal   Collection Time    12/04/12  4:00 AM      Result Value Range   WBC 14.9 (*) 4.0 - 10.5 K/uL   RBC 2.93 (*) 3.87 - 5.11 MIL/uL   Hemoglobin 8.0 (*) 12.0 - 15.0 g/dL   HCT 96.0 (*)  45.4 - 46.0 %   MCV 83.3  78.0 - 100.0 fL   MCH 27.3  26.0 - 34.0 pg   MCHC 32.8  30.0 - 36.0 g/dL   RDW 09.8 (*) 11.9 - 14.7 %   Platelets 147 (*) 150 - 400 K/uL   Neutrophils Relative % 89 (*) 43 - 77 %   Neutro Abs 13.2 (*) 1.7 - 7.7 K/uL   Lymphocytes Relative 5 (*) 12 - 46 %   Lymphs Abs 0.7  0.7 - 4.0 K/uL   Monocytes Relative 5  3 - 12 %   Monocytes Absolute 0.8  0.1 - 1.0 K/uL   Eosinophils Relative 1  0 - 5 %   Eosinophils Absolute 0.1  0.0 - 0.7 K/uL   Basophils Relative 0  0 - 1 %   Basophils Absolute 0.0  0.0 - 0.1  K/uL  BASIC METABOLIC PANEL     Status: Abnormal   Collection Time    12/04/12  4:00 AM      Result Value Range   Sodium 147 (*) 135 - 145 mEq/L   Potassium 3.7  3.5 - 5.1 mEq/L   Chloride 113 (*) 96 - 112 mEq/L   CO2 26  19 - 32 mEq/L   Glucose, Bld 131 (*) 70 - 99 mg/dL   BUN 70 (*) 6 - 23 mg/dL   Creatinine, Ser 7.82 (*) 0.50 - 1.10 mg/dL   Calcium 95.6 (*) 8.4 - 10.5 mg/dL   GFR calc non Af Amer 21 (*) >90 mL/min   GFR calc Af Amer 25 (*) >90 mL/min  GLUCOSE, CAPILLARY     Status: Abnormal   Collection Time    12/04/12  4:01 AM      Result Value Range   Glucose-Capillary 119 (*) 70 - 99 mg/dL  GLUCOSE, CAPILLARY     Status: Abnormal   Collection Time    12/04/12  7:38 AM      Result Value Range   Glucose-Capillary 163 (*) 70 - 99 mg/dL    Studies/Results: @RISRSLT24 @  Medications:  Reviewed   @PROBHOSP @  1.  Afib Rates have grad creeped up over past 12 hours.  Will increase dilt to 60 q 6 hours.  Continue Dig.  2.  CHF  Echo today to evaluate LVEF  Exam with increased vol, pulm edema.  Will switch lasix to IV      LOS: 25 days   Dietrich Pates 12/04/2012, 10:23 AM

## 2012-12-04 NOTE — Progress Notes (Signed)
Echocardiogram 2D Echocardiogram has been performed.  [name] 12/04/2012, 12:12 PM

## 2012-12-04 NOTE — Evaluation (Signed)
[facility] - Evaluation Patient Details  Name: JIANNA DRABIK MRN: [id number] Date of Birth: [birth year]  Today's Date: 12/04/2012 Time: 1330-1409 SLP Time Calculation (min): 39 min  Past Medical History:  Past Medical History  Diagnosis Date  . CAD (coronary artery disease)     a. Nonobst by cath 11/11: LAD 40%, mid-dist 25-30%; prox CFX 30%, mid 40%; OM 50%; PDA 50%; PLV 50%;  EF 65%.  . NSTEMI (non-ST elevated myocardial infarction)     a. In setting of AFib with RVR 03/2010, type 2. b. Again in 11/2011 felt 2/2 afib.  . Atrial fibrillation     a. DCCV 11/17/11. b. On coumadin & amiodarone.  Marland Kitchen HOCM (hypertrophic obstructive cardiomyopathy)     a. Echo 11/2011:severe LVH, EF 65-70%, mid-cavity gradient up to . b. Echo 06/2012: EF 65-70%, severe LVH, grade 2 d/dysf.  Marland Kitchen Hypertension   . Tobacco abuse   . CKD (chronic kidney disease), stage IV   . Diastolic CHF     preserved LVF  . Iron deficiency anemia   . Third degree uterine prolapse   . Hx MRSA infection   . Hx of cardiovascular stress test     a. Lex MV 12/13:  EF 56%, no ischemia    . Diabetes   . Hyperlipidemia   . Respiratory failure     Multiple admissions for resp failure requiring intubation.  . Hypercalcemia   . LBBB (left bundle branch block)     History of transient LBBB  . C. difficile colitis 01/2012   Past Surgical History:  Past Surgical History  Procedure Laterality Date  . Tubal ligation    . Cardioversion  11/17/2011    Procedure: CARDIOVERSION;  Surgeon: Hillis Range, MD;  Location: [facility] OR;  Service: Cardiovascular;  Laterality: N/A;   HPI:  72 y/o female with CHF was admitted on 7/3 from the [facility] ED with acute hypoxemic respiratory failure due to a CHF exacerbation, ?Superimposed PNA.  Marland KitchenLINES / TUBES:7/3 ETT >>11/13/12, 11/13/12  (failed extubation immediately, ? aspirated) >> 7/17; Trach 7/17;   Assessment / Plan / Recommendation Clinical Impression  Pt. was unable to tolerate cuff  deflation due to copious blood tinged secretions.  Pt. coughed throughout trial, coughing secretions from trach (RT had just suctioned prior to this eval).  Pt. did appear to redierct air with finger occlusion (briefly, before coughing again) but did not obtain phonation, and did not open eyes or attempt any type of interaction with SLP.    SLP Assessment  Patient needs continued Speech Lanaguage Pathology Services    Follow Up Recommendations  LTACH    Frequency and Duration min 2x/week  2 weeks   Pertinent Vitals/Pain n/a    SLP Goals Potential to Achieve Goals: Fair Progress/Goals/Alternative treatment plan discussed with pt/caregiver and they: No caregivers available SLP Goal #1: Pt. will tolerate cuff deflation for 5 minutes with minimal coughing and maintaining Sats. above 95% SLP Goal #2: Pt. will redirect air with finger occlussion. SLP Goal #3: Pt. will wear PMSV for 5 minutes with all VSS.   PMSV Trial  PMSV was placed for: PMSV was not placed, as pt. did not tolerate cuff deflation due to increased secretions. Able to redirect subglottic air through upper airway: Yes Able to Attain Phonation: No Able to Expectorate Secretions: Yes Level of Secretion Expectoration with PMSV: Tracheal Intelligibility:  (No verbal attempts.) Respirations During Trial: 30 SpO2 During Trial: 92 % Pulse During Trial: 109   Tracheostomy  Tube       Vent Dependency  FiO2 (%): 40 %    Cuff Deflation Trial Tolerated Cuff Deflation: No Length of Time for Cuff Deflation Trial: 10 min Behavior:  (Sleepy; coughing) Cuff Deflation Trial - Comments: Pt. had just been suctioned by RT, but cough blood tinged secretions from trach and continued to cough throughout trial.  Sats decreased to 92 from 99%.   [name] T 12/04/2012, 2:32 PM

## 2012-12-04 NOTE — Progress Notes (Signed)
PULMONARY  / CRITICAL CARE MEDICINE  Name: [name] MRN: [id number] DOB: 04-Jul-1940    ADMISSION DATE:  11/09/2012 CONSULTATION DATE:  11/09/2012  REFERRING MD :  Preston Fleeting PRIMARY SERVICE: PCCM  CHIEF COMPLAINT:  Shortness of breath.  BRIEF PATIENT DESCRIPTION: 72 y/o female with CHF was admitted on 7/3 from the [facility] ED with acute hypoxemic respiratory failure due to a CHF exacerbation, ?Superimposed PNA.  LINES / TUBES: 7/3 ETT >>11/13/12, 11/13/12  (failed extubation immediately, ? aspirated) >> 7/17 7/4 CVC R IJ >> 7/17 7/5 A-line >> 7/18 7/17 Trach (DF) >> 7/17 Rt PICC >>  7/22 PEG >>>   CULTURES: 7/4 U strep >> neg 7/4 legionella >> neg 7/3 mrsa PCR >> POSITIVE 7/8 mini-BAL >> yeast 7/16 sputum >> yeast  7/16 blood >> neg 7/16 urine >> yeast 50 K colonies 7/26 C. Diff>>>neg  ANTIBIOTICS: 7/3 Ceftriaxone >>7/6 7/4 levofloxacin >>7/7 7/4 Vancomycin >>7/6, restarted 7/8 >> 7/18 7/7 Unasyn >> 7/16 7/16 cefepime >> 7/24  SIGNIFICANT EVENTS: 7/3 admission 7/07 reintubated immediately after extubation.  ? Aspiration. ABX restarted.  on Neo-Synephrine.  7/09 Able to wean fio2 and neo. Lopressor required for rate control of afib. 7/10 Off neo, but now tachycardic.  Failed SBT this AM, but RT felt it was due to sedation.  Sedation weaned 7/14 Hypotensive, vomiting, amiodarone turned off for low heart rate 7/16 Change to pressure control 7/17 Start pressors, PRBC transfusion, trach 7/18 Off pressors, PRBC transfusion  STUDIES: 7/16 CT chest >> emphysema, b/l ASD, small effusions 7/16 CT abd/pelvis >> 2.6 x 2 cm nodule in head of pancreas, bone changes suggestive of Paget's disease  SUBJECTIVE: Patient awake, makes eye contact, family at bedside Vent on  VITAL SIGNS: Temp:  [97 F (36.1 C)-98.8 F (37.1 C)] 98.7 F (37.1 C) (07/28 0828) Pulse Rate:  [40-95] 69 (07/28 0911) Resp:  [13-25] 19 (07/28 0911) BP: (90-129)/(56-70) 119/64 mmHg (07/28 0904) SpO2:  [93 %-100  %] 100 % (07/28 0911) FiO2 (%):  [40 %] 40 % (07/28 0911) Weight:  [167 lb 12.3 oz (76.1 kg)] 167 lb 12.3 oz (76.1 kg) (07/28 0500)  HEMODYNAMICS: CVP:  [3 mmHg-17 mmHg] 6 mmHg  VENTILATOR SETTINGS: Vent Mode:  [-] CPAP;PSV FiO2 (%):  [40 %] 40 % Set Rate:  [14 bmp] 14 bmp PEEP:  [5 cmH20] 5 cmH20 Pressure Support:  [8 cmH20] 8 cmH20 Plateau Pressure:  [14 cmH20-18 cmH20] 17 cmH20  INTAKE / OUTPUT: Intake/Output     07/27 0701 - 07/28 0700 07/28 0701 - 07/29 0700   I.V. (mL/kg) 200 (2.6) 40 (0.5)   NG/GT 1420    Total Intake(mL/kg) 1620 (21.3) 40 (0.5)   Urine (mL/kg/hr) 3175 (1.7) 800 (2.8)   Total Output 3175 800   Net -1555 -760         PHYSICAL EXAMINATION: Gen: Laying in bed, in NAD HEENT: PEG in place, trach site clean PULM: course resp's bilaterally CV: irregular, no m/r/g AB: soft, non tender, BS+ Ext: no edema Neuro: RASS 0, moves extremities, follows simple commands  LABS: CBC Recent Labs     12/02/12  0400  12/03/12  0425  12/04/12  0400  WBC  15.4*  13.4*  14.9*  HGB  7.8*  7.7*  8.0*  HCT  24.4*  24.2*  24.4*  PLT  139*  128*  147*   Coag's Recent Labs     12/02/12  0400  12/03/12  0425  12/04/12  0400  INR  1.87*  2.15*  2.20*   BMET Recent Labs     12/02/12  0400  12/04/12  0400  NA  149*  147*  K  4.6  3.7  CL  114*  113*  CO2  27  26  BUN  76*  70*  CREATININE  2.64*  2.19*  GLUCOSE  162*  131*   Electrolytes Recent Labs     12/02/12  0400  12/04/12  0400  CALCIUM  12.7*  11.9*  MG  2.1   --   PHOS  4.1   --    Glucose Recent Labs     12/03/12  1151  12/03/12  1604  12/03/12  2005  12/03/12  2334  12/04/12  0401  12/04/12  0738  GLUCAP  225*  153*  170*  181*  119*  163*    CXR: mod bil pleural effusions, patchy multifocal interstitial and airspace dz bil   ASSESSMENT / PLAN:  PULMONARY A:  Acute respiratory failure 2nd to pulmonary infiltrates, resolving. P:   - Continue vent wean as tolerated, goal  trach collar 6 hrs - PRN BD's. - Continue IV lasix to PO 40 mg PO BID, maintain to same neg balance goals - CXR in AM -control rate to avoid diastolic CHF -consider PMV when off vent  CARDIOVASCULAR A:  HOCM with acute on chronic diastolic heart failure. A fib with RVR >> amiodarone d/c due to bradycardia, hypotension. Hx of CAD, HTN, hyperlipidemia. Shock >> resolved P:  - Continue IV lasix PO 40 mg PO BID. - Anticoagulation managed by Cards. Continue on Warfarin and heparin dose. - Continue Cardizem PO for rate control 30 q6 hours.  RENAL A: Acute kidney injury >> renal fx stable. Hypernatremia resolving Hypokalemia - resolved Hypercalcemia - improving P:   - KVO IV fluids. - Monitor renal fx, urine outpt. - F/U electrolytes in AM -replace electrolytes PRN -maintain free water and lasix -also assess mag, phos in am   GASTROINTESTINAL A:   Protein malnutrition. Vomiting - resolved Mass head of pancreas-- Will need further assessment of pancreatic lesion when more stable, likely in LTAC or outpatient. P:   - PEG tube  - Pepcid for SUP. -ensure ca 19-9 assessed  GYN A:  Uterine prolapse. P: - GYN consult called on Friday, prolapse per GYN.  HEMATOLOGIC A:  Anemia of chronic disease and critical illness >> PRBC transfusion 7/17, 7/18. Elevated WBC likely due to steroids and stress, no signs of active infection at this time. Hg now stable. Thrombocytopenia - improving  P:  - Monitor CBC in AM - Continue coumadin. -wbc was hemoconcetration last 24 hrs  INFECTIOUS A: Initial concern for aspiration pneumonia >> has persistent pulmonary infiltrates, and increasing WBC. P:   - F/U CXR in AM, follow fever curve  ENDOCRINE A:  DM2 P:   - SSI  NEUROLOGIC A: Acute encephalopathy 2nd to hypoxia/hypercapnia. Muscular deconditioning. P:   - PT/OT -speech as off vent for 4 hrs  Today's summary: Continue lasix, monitor labs, wean to goal 6 hrs tracv  collar  Arezu Shekari PA-S  I have fully examined this patient and agree with above findings.    And edited in full   Mcarthur Rossetti. Tyson Alias, MD, FACP Pgr: [phone] [facility]

## 2012-12-04 NOTE — Progress Notes (Signed)
Trach Team Note Pt still weaning from vent per chart. Will continue to follow.  Harlon Ditty, MA CCC-SLP [phone]

## 2012-12-04 NOTE — Progress Notes (Signed)
ANTICOAGULATION CONSULT NOTE  Pharmacy Consult for  Coumadin Indication: atrial fibrillation  No Known Allergies  Labs:  Recent Labs  12/02/12 0400 12/03/12 0425 12/04/12 0400 12/04/12 1100  HGB 7.8* 7.7* 8.0*  --   HCT 24.4* 24.2* 24.4*  --   PLT 139* 128* 147*  --   LABPROT 21.0* 23.3* 23.7*  --   INR 1.87* 2.15* 2.20*  --   HEPARINUNFRC 0.48 0.24*  --   --   CREATININE 2.64*  --  2.19* 2.10*    Estimated Creatinine Clearance: 23.1 ml/min (by C-G formula based on Cr of 2.1).  Assessment: 72 yo female with Afib for anticoagulation. INR therapeutic at 2.2  Goal of Therapy:  INR 2-3 Monitor platelets by anticoagulation protocol: Yes   Plan:  Coumadin 2.5 mg daily Follow up AM INR  Thank you. Okey Regal, PharmD [phone]  12/04/2012,1:48 PM

## 2012-12-05 ENCOUNTER — Inpatient Hospital Stay (HOSPITAL_COMMUNITY): Payer: Medicare Other

## 2012-12-05 DIAGNOSIS — Z7901 Long term (current) use of anticoagulants: Secondary | ICD-10-CM

## 2012-12-05 DIAGNOSIS — E876 Hypokalemia: Secondary | ICD-10-CM

## 2012-12-05 LAB — PROTIME-INR
INR: 2.64 — ABNORMAL HIGH (ref 0.00–1.49)
Prothrombin Time: 27.3 seconds — ABNORMAL HIGH (ref 11.6–15.2)

## 2012-12-05 LAB — PRO B NATRIURETIC PEPTIDE: Pro B Natriuretic peptide (BNP): 33388 pg/mL — ABNORMAL HIGH (ref 0–125)

## 2012-12-05 LAB — CBC
HCT: 25.6 % — ABNORMAL LOW (ref 36.0–46.0)
MCHC: 31.6 g/dL (ref 30.0–36.0)
MCV: 83.4 fL (ref 78.0–100.0)
Platelets: 136 10*3/uL — ABNORMAL LOW (ref 150–400)
RDW: 18 % — ABNORMAL HIGH (ref 11.5–15.5)
WBC: 12.7 10*3/uL — ABNORMAL HIGH (ref 4.0–10.5)

## 2012-12-05 LAB — GLUCOSE, CAPILLARY: Glucose-Capillary: 71 mg/dL (ref 70–99)

## 2012-12-05 LAB — BASIC METABOLIC PANEL
BUN: 64 mg/dL — ABNORMAL HIGH (ref 6–23)
Calcium: 11.1 mg/dL — ABNORMAL HIGH (ref 8.4–10.5)
Creatinine, Ser: 1.88 mg/dL — ABNORMAL HIGH (ref 0.50–1.10)
GFR calc Af Amer: 30 mL/min — ABNORMAL LOW (ref >90)

## 2012-12-05 LAB — MAGNESIUM: Magnesium: 1.7 mg/dL (ref 1.5–2.5)

## 2012-12-05 MED ORDER — DEXTROSE 5 % IV SOLN
INTRAVENOUS | Status: DC
  Administered 2012-12-05 – 2012-12-10 (×6): via INTRAVENOUS
  Administered 2012-12-16: 500 mL via INTRAVENOUS
  Administered 2012-12-21 – 2012-12-22 (×2): via INTRAVENOUS

## 2012-12-05 MED ORDER — FUROSEMIDE 10 MG/ML IJ SOLN
40.0000 mg | Freq: Every day | INTRAMUSCULAR | Status: DC
  Administered 2012-12-05: 40 mg via INTRAVENOUS

## 2012-12-05 MED ORDER — FUROSEMIDE 10 MG/ML IJ SOLN
20.0000 mg | Freq: Every day | INTRAMUSCULAR | Status: DC
  Administered 2012-12-06: 20 mg via INTRAVENOUS
  Filled 2012-12-05: qty 2

## 2012-12-05 NOTE — Progress Notes (Signed)
ANTICOAGULATION CONSULT NOTE - Follow Up Consult  Pharmacy Consult for Warfarin Indication: atrial fibrillation  No Known Allergies  Patient Measurements: Height: 5\' 2"  (157.5 cm) Weight: 178 lb 9.2 oz (81 kg) IBW/kg (Calculated) : 50.1  Vital Signs: Temp: 98.6 F (37 C) (07/29 0800) Temp src: Oral (07/29 0800) BP: 114/78 mmHg (07/29 0800) Pulse Rate: 72 (07/29 0800)  Labs:  Recent Labs  12/03/12 0425 12/04/12 0400 12/04/12 1100 12/05/12 0500  HGB 7.7* 8.0*  --  8.1*  HCT 24.2* 24.4*  --  25.6*  PLT 128* 147*  --  136*  LABPROT 23.3* 23.7*  --  27.3*  INR 2.15* 2.20*  --  2.64*  HEPARINUNFRC 0.24*  --   --   --   CREATININE  --  2.19* 2.10* 1.88*    Estimated Creatinine Clearance: 26.7 ml/min (by C-G formula based on Cr of 1.88).   Assessment: 72 y.o. F who continues on warfarin for anticoagulation with hx Afib. INR this morning is therapeutic(INR 2.64 << 2.2, goal of 2-3). It is noted that TFs were held on 7/25 due to emesis and resumed on 7/28 evening. This likely contributed to some increased warfarin sensitivity -- however this should be resolved now that TFs have been resumed.  Goal of Therapy:  INR 2-3   Plan:  1. Continue warfarin 2.5 mg daily 2. Will continue to monitor for any signs/symptoms of bleeding and will follow up with PT/INR in the a.m.   Georgina Pillion, PharmD, BCPS Clinical Pharmacist Pager: [phone] 12/05/2012 8:27 AM

## 2012-12-05 NOTE — Progress Notes (Signed)
PULMONARY  / CRITICAL CARE MEDICINE  Name: [name] MRN: [id number] DOB: [birth year]    ADMISSION DATE:  11/09/2012 CONSULTATION DATE:  11/09/2012  REFERRING MD :  Preston Fleeting PRIMARY SERVICE: PCCM  CHIEF COMPLAINT:  Shortness of breath.  BRIEF PATIENT DESCRIPTION: 72 y/o female with CHF was admitted on 7/3 from the [facility] ED with acute hypoxemic respiratory failure due to a CHF exacerbation, ?Superimposed PNA.  LINES / TUBES: 7/3 ETT >>11/13/12, 11/13/12  (failed extubation immediately, ? aspirated) >> 7/17 7/4 CVC R IJ >> 7/17 7/5 A-line >> 7/18 7/17 Trach (DF) >> 7/17 Rt PICC >>  7/22 PEG >>>  CULTURES: 7/4 U strep >> neg 7/4 legionella >> neg 7/3 mrsa PCR >> POSITIVE 7/8 mini-BAL >> yeast 7/16 sputum >> yeast  7/16 blood >> neg 7/16 urine >> yeast 50 K colonies 7/26 C. Diff>>>neg  ANTIBIOTICS: 7/3 Ceftriaxone >>7/6 7/4 levofloxacin >>7/7 7/4 Vancomycin >>7/6, restarted 7/8 >> 7/18 7/7 Unasyn >> 7/16 7/16 cefepime >> 7/24  SIGNIFICANT EVENTS: 7/3 admission 7/07 reintubated immediately after extubation.  ? Aspiration. ABX restarted.  on Neo-Synephrine.  7/09 Able to wean fio2 and neo. Lopressor required for rate control of afib. 7/10 Off neo, but now tachycardic.  Failed SBT this AM, but RT felt it was due to sedation.  Sedation weaned 7/14 Hypotensive, vomiting, amiodarone turned off for low heart rate 7/16 Change to pressure control 7/17 Start pressors, PRBC transfusion, trach 7/18 Off pressors, PRBC transfusion 7/28- 10-12 hrs trach collar trial  STUDIES: 7/16 CT chest >> emphysema, b/l ASD, small effusions 7/16 CT abd/pelvis >> 2.6 x 2 cm nodule in head of pancreas, bone changes suggestive of Paget's disease  SUBJECTIVE: Patient awakens  VITAL SIGNS: Temp:  [97 F (36.1 C)-98.6 F (37 C)] 98.2 F (36.8 C) (07/29 1100) Pulse Rate:  [58-79] 74 (07/29 1100) Resp:  [14-29] 16 (07/29 1100) BP: (93-147)/(59-80) 121/64 mmHg (07/29 1100) SpO2:  [97 %-100 %] 99 %  (07/29 1100) FiO2 (%):  [35 %-40 %] 35 % (07/29 1100) Weight:  [178 lb 9.2 oz (81 kg)] 178 lb 9.2 oz (81 kg) (07/29 0446)  HEMODYNAMICS: CVP:  [5 mmHg-12 mmHg] 11 mmHg  VENTILATOR SETTINGS: Vent Mode:  [-] PCV FiO2 (%):  [35 %-40 %] 35 % Set Rate:  [16 bmp] 16 bmp PEEP:  [5 cmH20] 5 cmH20 Plateau Pressure:  [16 cmH20-17 cmH20] 17 cmH20  INTAKE / OUTPUT: Intake/Output     07/28 0701 - 07/29 0700 07/29 0701 - 07/30 0700   I.V. (mL/kg) 360 (4.4) 80 (1)   NG/GT 886.7 340   Total Intake(mL/kg) 1246.7 (15.4) 420 (5.2)   Urine (mL/kg/hr) 4775 (2.5) 500 (1.4)   Total Output 4775 500   Net -3528.3 -80         PHYSICAL EXAMINATION: Gen: Laying in bed, in No distress on trach collar HEENT: PEG in place, trach site clean PULM: course improved after neg  balance CV: irregular, no m/r/g AB: soft, non tender, BS+ Ext: no edema Neuro: RASS 0, moves extremities, follows simple commands intermittent  LABS: CBC Recent Labs     12/03/12  0425  12/04/12  0400  12/05/12  0500  WBC  13.4*  14.9*  12.7*  HGB  7.7*  8.0*  8.1*  HCT  24.2*  24.4*  25.6*  PLT  128*  147*  136*   Coag's Recent Labs     12/03/12  0425  12/04/12  0400  12/05/12  0500  INR  2.15*  2.20*  2.64*   BMET Recent Labs     12/04/12  0400  12/04/12  1100  12/05/12  0500  NA  147*  145  151*  K  3.7  3.8  3.0*  CL  113*  112  114*  CO2  26  23  25   BUN  70*  67*  64*  CREATININE  2.19*  2.10*  1.88*  GLUCOSE  131*  238*  76   Electrolytes Recent Labs     12/04/12  0400  12/04/12  1100  12/05/12  0500  CALCIUM  11.9*  11.8*  11.1*  MG   --    --   1.7  PHOS   --    --   4.9*   Glucose Recent Labs     12/04/12  0738  12/04/12  1157  12/04/12  1553  12/04/12  2010  12/04/12  2350  12/05/12  0418  GLUCAP  163*  207*  177*  237*  224*  71    CXR: 7/29 improved aeration in lung bases with mild atx persisting, resolution of bil pleural effusions   ASSESSMENT / PLAN:  PULMONARY A:   Acute respiratory failure 2nd to pulmonary infiltrates, resolving. P:   - Met goal of 10 hrs on trach collar so continue wean and put back on vent at night - PRN BD's. - Continue IV lasix to PO 40 mg PO BID, maintain to same neg balance goals - CXR in AM -control rate to avoid diastolic CHF -pmv attempts -will attempt 24 hrs trach collar trials -much improved after lasix -to goal 28% -if successful in am to TC 24 hr, will consider change to 6 cuffless  CARDIOVASCULAR A:  HOCM with acute on chronic diastolic heart failure. A fib with RVR >> amiodarone d/c due to bradycardia, hypotension. Hx of CAD, HTN, hyperlipidemia. Shock >> resolved P:  - Continue IV lasix PO 40 mg PO BID. - Anticoagulation managed by Cards. Continue on Warfarin and heparin dose. - Continue Cardizem PO for rate control 30 q6 hours.  RENAL A: Acute kidney injury >> renal fx stable. Hypernatremia  Hypokalemia - resolved Hypercalcemia - improving Hyperphosphatemia Mag WNL P:   - KVO IV fluids. - Monitor renal fx, urine outpt. - F/U electrolytes in AM -replace electrolytes PRN -maintain free water and lasix, may need some reduction -add d5w slight at 30   GASTROINTESTINAL A:   Protein malnutrition. Vomiting - resolved Mass head of pancreas-- Will need further assessment of pancreatic lesion when more stable, likely in LTAC or outpatient, Ca 19-9 elevated (73.1)- concern cancer P:   - PEG tube  - Pepcid for SUP. -she is progressing from resp standpoint, ca 19-9 up, mas noted, will involve gen surgery consult for plan ? Mrcp?  Bx?  GYN A:  Uterine prolapse. P: - GYN consult called on Friday, prolapse per GYN.  HEMATOLOGIC A:  Anemia of chronic disease and critical illness >> PRBC transfusion 7/17, 7/18. Thrombocytopenia  hemoconcetration 7/28 P:  - Monitor CBC in AM - Continue coumadin. -wbc was hemoconcetration last 24 hrs  INFECTIOUS A: Initial concern for aspiration pneumonia >>  has persistent pulmonary infiltrates, and increasing WBC. P:   - F/U CXR in AM, follow fever curve  ENDOCRINE A:  DM2 P:   - SSI  NEUROLOGIC A: Acute encephalopathy 2nd to hypoxia/hypercapnia. Muscular deconditioning. P:   - PT/OT, progress , especialy as neuro better and trach collar successful  Today's summary: Continue lasix but reduce, monitor labs, wean, 24 hr trach collar trial, replace electrolytes, gen surg eval mass  Arezu Shekari PA-S  I have fully examined this patient and agree with above findings.    And edited in full  Mcarthur Rossetti. Tyson Alias, MD, FACP Pgr: [phone] [facility]

## 2012-12-05 NOTE — Progress Notes (Signed)
Patient ID: [name], female   DOB: 08-05-1940, 72 y.o.   MRN: [id number]   SUBJECTIVE: Patient had significant diuresis with change in diuretics yesterday. She had a two-dimensional echo showing continued vigorous left ventricular function. 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 There is a high velocity gradient in the mid left ventricle. There is no definite left ventricular outflow tract gradient. With this vigorous function, we have to be sure that the patient does not become too dry. Renal function looks a little better with the diuresis. Potassium is down to 3.0 and needs to be supplemented.   Patient is poorly responsive today.   Filed Vitals:   12/05/12 0023 12/05/12 0350 12/05/12 0415 12/05/12 0446  BP:  93/59    Pulse:  58    Temp: 97.9 F (36.6 C)  97 F (36.1 C)   TempSrc: Oral  Oral   Resp:  16    Height:      Weight:    178 lb 9.2 oz (81 kg)  SpO2:  100%      Intake/Output Summary (Last 24 hours) at 12/05/12 0703 Last data filed at 12/05/12 0600  Gross per 24 hour  Intake 1216.67 ml  Output   4775 ml  Net -3558.33 ml    LABS: Basic Metabolic Panel:  Recent Labs  13/08/65 1100 12/05/12 0500  NA 145 151*  K 3.8 3.0*  CL 112 114*  CO2 23 25  GLUCOSE 238* 76  BUN 67* 64*  CREATININE 2.10* 1.88*  CALCIUM 11.8* 11.1*  MG  --  1.7  PHOS  --  4.9*   Liver Function Tests: No results found for this basename: AST, ALT, ALKPHOS, BILITOT, PROT, ALBUMIN,  in the last 72 hours No results found for this basename: LIPASE, AMYLASE,  in the last 72 hours CBC:  Recent Labs  12/04/12 0400 12/05/12 0500  WBC 14.9* 12.7*  NEUTROABS 13.2*  --   HGB 8.0* 8.1*  HCT 24.4* 25.6*  MCV 83.3 83.4  PLT 147* 136*   Cardiac Enzymes: No results found for this basename: CKTOTAL, CKMB, CKMBINDEX, TROPONINI,  in the last 72 hours BNP: No components found with this basename: POCBNP,  D-Dimer: No results found for this basename: DDIMER,  in the last 72  hours Hemoglobin A1C: No results found for this basename: HGBA1C,  in the last 72 hours Fasting Lipid Panel: No results found for this basename: CHOL, HDL, LDLCALC, TRIG, CHOLHDL, LDLDIRECT,  in the last 72 hours Thyroid Function Tests: No results found for this basename: TSH, T4TOTAL, FREET3, T3FREE, THYROIDAB,  in the last 72 hours  RADIOLOGY: Ct Abdomen Pelvis Wo Contrast  11/22/2012   *RADIOLOGY REPORT*  Clinical Data:  Pulmonary infiltrates with abdominal pain.  CT CHEST, ABDOMEN AND PELVIS WITHOUT CONTRAST  Technique:  Multidetector CT imaging of the chest, abdomen and pelvis was performed following the standard protocol without IV contrast.  Comparison:   None.  CT CHEST  Findings:  Tracheostomy tube tip is in the trachea above the level of the bifurcation.  NG tube tip is in the distal stomach.  No axillary lymphadenopathy.  Scattered small lymph nodes are seen in the mediastinum.  Hilar lymphadenopathy cannot be excluded on this study without intravenous contrast material.  The heart is enlarged. Coronary artery calcification is noted.  No pericardial effusion.  Small bilateral pleural effusions are evident.  Lung windows show advanced underlying emphysema.  There is a peripheral interstitial and airspace disease with bilateral dependent collapse /  consolidation.  The dense airspace consolidation mainly involves the lower lobes.  Bone windows reveal no worrisome lytic or sclerotic osseous lesions.  IMPRESSION: Emphysema with peripheral interstitial and airspace disease associated with bilateral lower lobe collapse / consolidation. Underlying pulmonary edema not excluded.  Small bilateral pleural effusions.  CT ABDOMEN AND PELVIS  Findings:  No focal abnormalities seen in the liver or spleen. Duodenum and adrenal glands are unremarkable.  The gallbladder lumen is filled with high attenuation material which may be from vicarious excretion of previous contrast material.  Right kidney is unremarkable.   There is mild fullness of the left intrarenal collecting system.  2.6 x 2.0 cm apparent soft tissue nodule is seen along the inferior head of pancreas.  This is difficult to assess given motion artifact and lack of intravenous contrast material.  No abdominal aortic aneurysm.  No free fluid in the abdomen.  No evidence for small bowel obstruction.  Imaging through the pelvis shows no free intraperitoneal fluid. Foley catheter decompresses the urinary bladder.  Uterus is surgically absent.  There is no adnexal mass.  There is some diverticular change in the left colon without diverticulitis. Terminal ileum is normal.  The appendix is normal.  Abnormal soft tissue attenuation is seen at the umbilicus.  There is some mild body wall edema in the region of the pelvis.  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  No worrisome lytic or sclerotic osseous abnormality.  IMPRESSION: 2.6 cm soft tissue nodule along the inferior head of pancreas. This is not completely characterized given motion artifact and lack of intravenous contrast material. Pancreatic neoplasm not excluded.  Soft tissue attenuation of the umbilicus.  This could be related to infection or scar.  This should be amendable to clinical inspection.  Mild fullness of the left intrarenal collecting system is of indeterminate etiology/significance.  Underlying component of UPJ obstruction would be a consideration.  Mild body wall edema.   Original Report Authenticated By: Kennith Center, M.D.   Ct Chest Wo Contrast  11/22/2012   *RADIOLOGY REPORT*  Clinical Data:  Pulmonary infiltrates with abdominal pain.  CT CHEST, ABDOMEN AND PELVIS WITHOUT CONTRAST  Technique:  Multidetector CT imaging of the chest, abdomen and pelvis was performed following the standard protocol without IV contrast.  Comparison:   None.  CT CHEST  Findings:  Tracheostomy tube tip is in the trachea above the level of the bifurcation.  NG tube tip is in  the distal stomach.  No axillary lymphadenopathy.  Scattered small lymph nodes are seen in the mediastinum.  Hilar lymphadenopathy cannot be excluded on this study without intravenous contrast material.  The heart is enlarged. Coronary artery calcification is noted.  No pericardial effusion.  Small bilateral pleural effusions are evident.  Lung windows show advanced underlying emphysema.  There is a peripheral interstitial and airspace disease with bilateral dependent collapse / consolidation.  The dense airspace consolidation mainly involves the lower lobes.  Bone windows reveal no worrisome lytic or sclerotic osseous lesions.  IMPRESSION: Emphysema with peripheral interstitial and airspace disease associated with bilateral lower lobe collapse / consolidation. Underlying pulmonary edema not excluded.  Small bilateral pleural effusions.  CT ABDOMEN AND PELVIS  Findings:  No focal abnormalities seen in the liver or spleen. Duodenum and adrenal glands are unremarkable.  The gallbladder lumen is filled with high attenuation material which may be from vicarious excretion of previous contrast material.  Right kidney is unremarkable.  There is mild  fullness of the left intrarenal collecting system.  2.6 x 2.0 cm apparent soft tissue nodule is seen along the inferior head of pancreas.  This is difficult to assess given motion artifact and lack of intravenous contrast material.  No abdominal aortic aneurysm.  No free fluid in the abdomen.  No evidence for small bowel obstruction.  Imaging through the pelvis shows no free intraperitoneal fluid. Foley catheter decompresses the urinary bladder.  Uterus is surgically absent.  There is no adnexal mass.  There is some diverticular change in the left colon without diverticulitis. Terminal ileum is normal.  The appendix is normal.  Abnormal soft tissue attenuation is seen at the umbilicus.  There is some mild body wall edema in the region of the pelvis.  Bone windows show changes  in the left pelvis presumably related to Paget's disease.  No worrisome lytic or sclerotic osseous abnormality.  IMPRESSION: 2.6 cm soft tissue nodule along the inferior head of pancreas. This is not completely characterized given motion artifact and lack of intravenous contrast material. Pancreatic neoplasm not excluded.  Soft tissue attenuation of the umbilicus.  This could be related to infection or scar.  This should be amendable to clinical inspection.  Mild fullness of the left intrarenal collecting system is of indeterminate etiology/significance.  Underlying component of UPJ obstruction would be a consideration.  Mild body wall edema.   Original Report Authenticated By: Kennith Center, M.D.   Ir Gastrostomy Tube Mod Sed  11/28/2012   *RADIOLOGY REPORT*  Indication:  Malnutrition  PULL TROUGH GASTOSTOMY TUBE PLACEMENT  Comparison: Abdominal radiograph - earlier same day; CT abdomen pelvis - 11/22/2012  Medications:  Versed 2 mg IV; Fentanyl 25 mcg IV; the patient is currently admitted to the hospital receiving intravenous antibiotic; Antibiotics were administered within 1 hour of the procedure.  Contrast volume:  20 mL Omnipaque-300 administered into the gastric lumen  Sedation time: 8 minutes  Fluoroscopy time: 1 minute, 48 seconds  Complications: None immediate  PROCEDURE/FINDINGS:  Informed written consent was obtained from the patient's family following explanation of the procedure, risks, benefits and alternatives.  A time out was performed prior to the initiation of the procedure.  Maximal barrier sterile technique utilized including caps, mask, sterile gowns, sterile gloves, large sterile drape, hand hygiene and Betadine prep.  The left upper quadrant was sterilely prepped and draped.  An oral gastric catheter was inserted into the stomach under fluoroscopy. The existing nasogastric feeding tube was removed.  The left costal margin and barium opacified transverse colon were identified and avoided.   Air was injected into the stomach for insufflation and visualization under fluoroscopy.  Under sterile conditions a 17 gauge trocar needle was utilized to access the stomach percutaneously beneath the left subcostal margin after the overlying soft tissues were anesthetized with 1% Lidocaine with epinephrine.  Needle position was confirmed within the stomach with aspiration of air and injection of small amount of contrast.   A single T tack was deployed for gastropexy.  Over an Amplatz guide wire, a 9-French sheath was inserted into the stomach.  A snare device was utilized to capture the oral gastric catheter.  The snare device was pulled retrograde from the stomach up the esophagus and out the oropharynx.  The 20-French pull-through gastrostomy was connected to the snare device and pulled antegrade through the oropharynx down the esophagus into the stomach and then through the percutaneous tract external to the patient.  The gastrostomy was assembled externally.  Contrast injection confirms  position in the stomach.   Several spot radiographic images were obtained in various obliquities for documentation.  The patient tolerated procedure well without immediate post procedural complication.  IMPRESSION:  Successful fluoroscopic insertion of a 20-French "pull-through" gastrostomy.   Original Report Authenticated By: Tacey Ruiz, MD   Dg Chest Port 1 View  12/03/2012   *RADIOLOGY REPORT*  Clinical Data: Evaluate for possible aspiration pneumonia.  PORTABLE CHEST - 1 VIEW  Comparison: Chest x-ray 11/30/2012.  Findings: A tracheostomy tube is in place with tip 4.3 cm above the carina. There is a right upper extremity PICC with tip terminating in the superior cavoatrial junction. Lung volumes are low. Widespread multifocal interstitial and airspace disease throughout the lungs bilaterally, asymmetrically distributed with relative sparing of the left upper lobe.  Moderate bilateral pleural effusions.  Mild  cardiomegaly. The patient is rotated to the left on today's exam, resulting in distortion of the mediastinal contours and reduced diagnostic sensitivity and specificity for mediastinal pathology.  IMPRESSION: 1.  Support apparatus, as above. 2.  Patchy multifocal interstitial and airspace disease asymmetrically distributed throughout the lungs bilaterally, concerning for multilobar pneumonia. 3.  Moderate bilateral pleural effusions. 4.  Mild cardiomegaly.   Original Report Authenticated By: Trudie Reed, M.D.   Dg Chest Port 1 View  11/30/2012   *RADIOLOGY REPORT*  Clinical Data: Evaluate tracheostomy  PORTABLE CHEST - 1 VIEW  Comparison: Portable chest x-ray of 11/25/2012  Findings: The tracheostomy is unchanged in position with the tip overlying the tracheal air shadow well above the carina.  Right PICC line tip is noted to the lower SVC.  Cardiomegaly and pulmonary vascular congestion with effusions remain consistent with mild congestive heart failure.  IMPRESSION:  1.  No change in mild congestive heart failure with effusions. 2.  No change in position of tracheostomy and right central venous line.   Original Report Authenticated By: Dwyane Dee, M.D.   Dg Chest Port 1 View  11/25/2012   *RADIOLOGY REPORT*  Clinical Data: Follow up pneumonia.  PORTABLE CHEST - 1 VIEW  Comparison: Chest radiograph performed 11/24/2012  Findings: The lungs are relatively well expanded.  Diffuse bilateral airspace opacification is again noted, worse on the right.  This may reflect multifocal pneumonia or pulmonary edema. Small bilateral pleural effusions are noted.  No pneumothorax is seen.  The cardiomediastinal silhouette is mildly enlarged.  The patient's tracheostomy tube is seen ending 4-5 cm above the carina.  Enteric tube is noted extending below the diaphragm.  No acute osseous abnormalities are seen.  IMPRESSION:  1.  Diffuse bilateral airspace opacification again noted, worse on the right.  This is perhaps  slightly worsened from the prior study, and may reflect multifocal pneumonia or pulmonary edema.  Small bilateral pleural effusions noted. 2.  Mild cardiomegaly.   Original Report Authenticated By: Tonia Ghent, M.D.   Dg Chest Port 1 View  11/24/2012   *RADIOLOGY REPORT*  Clinical Data: Follow up pneumonia.  Intubated patient.  Tube placement.  PORTABLE CHEST - 1 VIEW  Comparison: 11/23/2012.  Findings: The cardiopericardial silhouette is enlarged.  There is diffuse right greater than left bilateral airspace disease.  This has progressed compared yesterday's examination.  The endotracheal tube remains present with the tip 6.3 cm from the carina.  Right upper extremity PICC is present with the tip at the level of the carina in the mid SVC.  Enteric tube is present.  There is probably a small right pleural effusion present.  IMPRESSION:  1.  Stable support apparatus. 2.  Worsening bilateral right greater than left airspace disease. This may represent multi focal pneumonia and / or edema associated with CHF in this patient with cardiomegaly.   Original Report Authenticated By: Andreas Newport, M.D.   Chest Portable 1 View To Assess Tube Placement And Rule-out Pneumothorax  11/23/2012   *RADIOLOGY REPORT*  Clinical Data: Evaluate tube placement  PORTABLE CHEST - 1 VIEW  Comparison: 11/23/2012  Findings: The tracheostomy tube is midline and the tip is above the carina.  There is a nasogastric tube with tip in the stomach.  Right arm PICC line tip is in the cavoatrial junction.  The right IJ catheter tip is in the projection of the SVC.  Heart size is moderately enlarged.  There are bilateral pleural effusions and interstitial edema consistent with CHF.  IMPRESSION:  1. Satisfactory position of the support apparatus including the tracheostomy tube 2.  Moderate CHF.   Original Report Authenticated By: Signa Kell, M.D.   Dg Chest Port 1 View  11/23/2012   *RADIOLOGY REPORT*  Clinical Data: Pulmonary  infiltrates, follow-up  PORTABLE CHEST - 1 VIEW  Comparison: CT chest of 11/22/2012 and chest x-ray of the same date  Findings: The tip of the endotracheal tube is approximately 5.8 cm above the carina.  The lungs appear slightly better aerated although diffuse airspace disease remains.  Haziness at the lung bases is most consistent with the presence of bilateral pleural effusions, and cardiomegaly is stable.  The right central venous line is unchanged in position.  IMPRESSION: Slightly improved aeration.  Persistent airspace disease with probable effusions.   Original Report Authenticated By: Dwyane Dee, M.D.   Dg Chest Port 1 View  11/22/2012   *RADIOLOGY REPORT*  Clinical Data: CHF, ventilatory support  PORTABLE CHEST - 1 VIEW  Comparison: 11/21/2012  Findings: Stable support apparatus.  Cardiomegaly with a similar pattern of diffuse edema.  No enlarging effusion or pneumothorax. No significant interval change.  IMPRESSION: Stable CHF pattern.   Original Report Authenticated By: Judie Petit. Miles Costain, M.D.   Dg Chest Port 1 View  11/21/2012   *RADIOLOGY REPORT*  Clinical Data: Respiratory failure; diminished lung sounds.  PORTABLE CHEST - 1 VIEW  Comparison: Chest radiograph performed 11/20/2012  Findings: The patient's endotracheal tube is seen ending 4 cm above the carina.  The lungs are relatively well expanded.  Vascular congestion is noted, with diffusely increased interstitial markings, concerning for persistent pulmonary edema.  Small bilateral pleural effusions are noted.  Findings are redistributed from the prior study, but otherwise grossly unchanged.  No pneumothorax is seen.  The cardiomediastinal silhouette is mildly enlarged.  No acute osseous abnormalities are seen.  The patient's enteric tube is noted extending below the diaphragm.  The right IJ line is noted ending about the distal SVC.  IMPRESSION: Vascular congestion and mild cardiomegaly, with diffusely increased interstitial markings, concerning for  persistent pulmonary edema. Small bilateral pleural effusions noted.  There has been mild interval redistribution of airspace opacities, but findings are otherwise grossly unchanged.   Original Report Authenticated By: Tonia Ghent, M.D.   Dg Chest Port 1 View  11/20/2012   *RADIOLOGY REPORT*  Clinical Data: Endotracheal tube placement  PORTABLE CHEST - 1 VIEW  Comparison: Portable exam 1019 hours compared to 11/18/2012  Findings: Rotated to the right. Tip of endotracheal tube projects 5.5 cm above carina. Nasogastric tube extends to at least the inferior mediastinum, unable to see beyond. Enlargement of cardiac silhouette. Tortuous aorta. Scattered infiltrates question  edema. Minimal fluid or atelectasis at minor fissure. No gross pneumothorax.  IMPRESSION: Tip of endotracheal tube projects 5.5 cm above carina. Enlargement of cardiac silhouette with scattered infiltrates question edema.   Original Report Authenticated By: Ulyses Southward, M.D.   Dg Chest Port 1 View  11/18/2012   *RADIOLOGY REPORT*  Clinical Data: Evaluate lungs.  PORTABLE CHEST - 1 VIEW  Comparison: 11/17/2012  Findings: Endotracheal tube, enteric tube, and right central venous catheter appear unchanged in position.  Shallow inspiration. Cardiac enlargement with pulmonary vascular congestion.  No edema or consolidation.  No blunting of costophrenic angles.  No pneumothorax.  Tortuous aorta.  IMPRESSION: Appliances remain in satisfactory apparent location.  Cardiac enlargement with mild pulmonary vascular congestion.   Original Report Authenticated By: Burman Nieves, M.D.   Dg Chest Port 1 View  11/17/2012   *RADIOLOGY REPORT*  Clinical Data: Endotracheal tube placement  PORTABLE CHEST - 1 VIEW  Comparison: November 16, 2012.  Findings: Endotracheal tube is in grossly good position with distal tip approximately 4 cm above the carina.  Nasogastric tube passes through esophagus into stomach.  Right internal jugular catheter line is unchanged in  position.  Stable cardiomediastinal silhouette.  No pneumothorax or pleural effusion is noted.  No acute pulmonary disease is noted.  IMPRESSION: Endotracheal tube in grossly good position.  No significant change compared to prior exam.   Original Report Authenticated By: Lupita Raider.,  M.D.   Dg Chest Port 1 View  11/16/2012   *RADIOLOGY REPORT*  Clinical Data: Evaluation of endotracheal tube.  History of coronary artery disease, atrial fibrillation, hypertension, and tobacco use.  PORTABLE CHEST - 1 VIEW  Comparison: 11/14/2012.  Findings: Tip of endotracheal tube terminates 3 cm above carina. Enteric tube is in place.  Tip is not included on the image.  Tip of right internal jugular venous catheter terminates in the lower portion of the superior vena cava near the cavoatrial junction.  No pneumothorax is evident.  There is cardiac silhouette enlargement which appears larger than on previous study.  Aortic ectasia is present.  There is slight upper lobe vascular prominence congestion pattern accentuated by semi-erect positioning.  There is decrease in the infiltrative densities within the right lung with decrease in the edema pattern.  There is elevation of the left hemidiaphragm with minimal atelectasis and infiltrative density in left base. There is decrease in the right pleural effusion with only slight residual blunting of the right costophrenic angle.  There is slight indistinctness of left costophrenic angle which may reflect small amount of left pleural fluid.  Osteopenic appearance of bones.  IMPRESSION: Tip of endotracheal tube terminates 3 cm above carina.  Enteric tube and right internal jugular venous catheter in place with details given above.  Cardiac silhouette enlargement which appears larger than on previous study.  Mild upper lobe vessel prominence accentuated by semi-erect positioning.  Decrease in the infiltrative densities within the right lung with decrease in the edema pattern.  There  is elevation of the left hemidiaphragm with minimal atelectasis and infiltrative density in left base.  There is decrease in the right pleural effusion with only slight residual blunting of the right costophrenic angle.  There is slight indistinctness of left costophrenic angle which may reflect small amount of left pleural fluid.   Original Report Authenticated By: Onalee Hua Call   Dg Chest Port 1 View  11/14/2012   *RADIOLOGY REPORT*  Clinical Data: Shortness of breath.  PORTABLE CHEST - 1 VIEW  Comparison: 11/13/2012  Findings: Endotracheal tube tip measures about 3.6 cm above the carina.  Enteric tube tip is out of the field of view but below the left hemidiaphragm.  Right central venous catheter tip in the cavoatrial junction.  Cardiac enlargement with mild pulmonary vascular congestion and perihilar edema.  Edema pattern appears slightly improved.  Probable right pleural effusion.  IMPRESSION: Appliances appear in satisfactory position.  Cardiac enlargement with pulmonary vascular congestion and improving edema.  Right pleural effusion.   Original Report Authenticated By: Burman Nieves, M.D.   Dg Chest Port 1 View  11/13/2012   *RADIOLOGY REPORT*  Clinical Data: ET tube placement.  PORTABLE CHEST - 1 VIEW  Comparison: Chest x-ray from earlier the same date.  Findings: Endotracheal tube terminates in the mid thoracic trachea. Gastric suction tube crosses the diaphragm.  Right IJ central venous catheter at the superior caval atrial junction.  No cardiomegaly.  Worsening diffuse  interstitial coarsening with small bilateral pleural effusions.  No asymmetric opacity.  No pneumothorax.  IMPRESSION: 1.  Endotracheal tube ends in the mid thoracic trachea. 2.  Worsening pulmonary edema compared to earlier today.   Original Report Authenticated By: Tiburcio Pea   Dg Chest Port 1 View  11/13/2012   *RADIOLOGY REPORT*  Clinical Data: Worsening pulmonary edema.  PORTABLE CHEST - 1 VIEW  Comparison: 11/12/2012   Findings: 0646 hours.  Endotracheal tube tip is 3.3 cm above the base of the carina. The NG tube passes into the stomach although the distal tip position is not included on the film.  Slight interval improvement and interstitial pulmonary edema.  Persistent basilar atelectasis with small bilateral pleural effusions noted right IJ central line tip overlies the mid SVC level. Telemetry leads overlie the chest.  IMPRESSION: Interval improvement and interstitial pulmonary edema.  Otherwise no substantial change.   Original Report Authenticated By: Kennith Center, M.D.   Dg Chest Port 1 View  11/12/2012   *RADIOLOGY REPORT*  Clinical Data: Evaluate lung fields, CHF  PORTABLE CHEST - 1 VIEW  Comparison: Prior chest x-ray 11/11/2012  Findings: Endotracheal tube tip is 3.9 cm above the carina.  Stable right IJ central venous catheter with the tip in the superior cavoatrial junction.  The nasogastric tube tip lies off the field of view, presumably within the stomach.  Increased pulmonary vascular congestion and interstitial edema with worsening bibasilar opacities.  The degree of cardiomegaly is similar compared to prior.  Probable small bilateral pleural effusions.  No pneumothorax.  IMPRESSION:  Worsening pulmonary edema now with small bilateral effusions and bibasilar atelectasis.  Stable and satisfactory support apparatus.   Original Report Authenticated By: Malachy Moan, M.D.   Dg Chest Port 1 View  11/11/2012   *RADIOLOGY REPORT*  Clinical Data: Hypoxia  PORTABLE CHEST - 1 VIEW  Comparison: Prior chest x-ray 11/10/2012  Findings: Endotracheal tube 4.9 cm above the carina.  Stable position of right IJ central venous catheter in the visualized portion of the nasogastric tube.  Increased bilateral interstitial and airspace opacities consistent with increasing pulmonary edema. Stable cardiomegaly.  Probable left pleural effusion.  No pneumothorax.  No acute osseous abnormality.  IMPRESSION:  Increasing pulmonary  edema.  Stable satisfactory support apparatus.   Original Report Authenticated By: Malachy Moan, M.D.   Dg Chest Port 1 View  11/11/2012   *RADIOLOGY REPORT*  Clinical Data: evaluate lungs and line placement  PORTABLE CHEST - 1 VIEW  Comparison: 11/10/2012  Findings: Endotracheal tube tip is above the carina.  There is a right  IJ catheter with tip in the cavoatrial junction.  Enteric tube tip is within the stomach.  There is mild cardiac enlargement and pulmonary vascular congestion.  No airspace consolidation.  IMPRESSION:  1.  Stable support apparatus. 2.  Cardiac enlargement and pulmonary vascular congestion.   Original Report Authenticated By: Signa Kell, M.D.   Dg Chest Port 1 View  11/10/2012   *RADIOLOGY REPORT*  Clinical Data: Coronary artery disease.  Line placement.  PORTABLE CHEST - 1 VIEW  Comparison: 11/10/2012 at to 09/23/1988  Findings: New right IJ catheter noted with tip projecting over the lower SVC.  No pneumothorax.  Endotracheal tube tip 4.6 cm above the carina.  Nasogastric tube enters the stomach.  Moderate cardiomegaly noted with interstitial edema.  Airspace edema is improved.  Mild retrocardiac airspace opacity.  Mildly blunted left costophrenic angle.  IMPRESSION:  1.  Improved edema. 2.  Right IJ line tip:  Lower SVC.  No pneumothorax. 3.  Cardiomegaly. 4.  Left lower lobe airspace opacity potentially representing atelectasis or pneumonia.  Potential small left pleural effusion.   Original Report Authenticated By: Gaylyn Rong, M.D.   Dg Chest Port 1 View  11/10/2012   *RADIOLOGY REPORT*  Clinical Data: Check ETT  PORTABLE CHEST - 1 VIEW  Comparison: 11/09/2012  Findings: Cardiomegaly with suspected mild interstitial edema. Superimposed right upper lobe pneumonia is possible.  Patchy left lower lobe opacity, likely a combination of atelectasis and small pleural effusion.  No pneumothorax.  Endotracheal tube terminates 4 cm above the carina.  Enteric tube courses into the  stomach.  IMPRESSION: Endotracheal tube terminates 4 cm above the carina.  Cardiomegaly with mild interstitial edema and small left pleural effusion.  Superimposed right upper lobe pneumonia is possible.   Original Report Authenticated By: Charline Bills, M.D.   Dg Chest Portable 1 View  11/09/2012   *RADIOLOGY REPORT*  Clinical Data: Bradycardia.  PORTABLE CHEST - 1 VIEW  Comparison: Chest 11/09/2012 and 1748 hours.  Findings: Support tubes and lines are unchanged.  Pulmonary edema and pleural effusions seen on the prior study persist but have improved.  There is cardiomegaly.  No pneumothorax.  IMPRESSION: Improved pulmonary edema.   Original Report Authenticated By: Holley Dexter, M.D.   Dg Chest Portable 1 View  11/09/2012   *RADIOLOGY REPORT*  Clinical Data: Respiratory distress.  PORTABLE CHEST - 1 VIEW  Comparison: Plain film chest 10/31/2012.  Findings: The patient has a new endotracheal tube in place with the tip in good position 2.7 cm above the carina.  There is extensive bilateral airspace disease and small effusions, larger on the right.  Cardiomegaly is noted.  IMPRESSION:  1.  ET tube in good position. 2.  Extensive bilateral airspace disease and small effusions likely due to pulmonary edema.   Original Report Authenticated By: Holley Dexter, M.D.   Dg Abd Portable 1v  11/28/2012   *RADIOLOGY REPORT*  Clinical Data: Evaluate barium prior to potential percutaneous gastrostomy tube placement  PORTABLE ABDOMEN - 1 VIEW  Comparison: CT abdomen pelvis - 11/22/2012; chest radiograph - 11/25/2012  Findings:  Enteric contrast is seen throughout the colon.  No evidence of enteric obstruction.  No supine evidence of pneumoperitoneum.  No definite pneumatosis or portal venous gas. An enteric tube tip and side port project over the expected location of the gastric antrum.  Limited visualization of the lower thorax demonstrates enlarged cardiac silhouette.  Grossly unchanged bones.  IMPRESSION:  Enteric contrast seen throughout the colon.  No evidence of  obstruction.   Original Report Authenticated By: Tacey Ruiz, MD   Dg Abd Portable 1v  11/24/2012   *RADIOLOGY REPORT*  Clinical Data: Feeding tube placement.  PORTABLE ABDOMEN - 1 VIEW  Comparison: 11/21/2012  Findings: Nasogastric tube is in the distal stomach region. Nonspecific bowel gas pattern.  Limited evaluation of the lung bases.  IMPRESSION: Nasogastric tube is in the distal stomach region.   Original Report Authenticated By: Richarda Overlie, M.D.   Dg Abd Portable 1v  11/21/2012   *RADIOLOGY REPORT*  Clinical Data: Evaluate vomiting.  PORTABLE ABDOMEN - 1 VIEW  Comparison: 11/13/2012.  Findings: Enteric tube has been placed with its tip in the stomach. Bowel gas pattern is nonobstructive.  Within limits of evaluation on this supine radiograph, no visible free air.  IMPRESSION: Enteric tube tip lies along the greater curvature of the stomach.   Original Report Authenticated By: Davonna Belling, M.D.   Dg Abd Portable 1v  11/13/2012   *RADIOLOGY REPORT*  Clinical Data: OG tube placement  PORTABLE ABDOMEN - 1 VIEW  Comparison: None  Findings: Enteric tube tip is in the stomach.  The side port is below the GE junction. Bowel gas pattern appears well within normal limits.  IMPRESSION:  1.  Enteric tube placement with tip in the stomach.   Original Report Authenticated By: Signa Kell, M.D.    PHYSICAL EXAM  patient is poorly responsive. Lungs reveal scattered rhonchi. She does not have significant edema in her legs today. Cardiac exam reveals S1 and S2.   TELEMETRY: I have reviewed telemetry today December 05, 2012. There is atrial fibrillation. The rate is 60-65.   ASSESSMENT AND PLAN:     Acute on chronic diastolic congestive heart failure    The patient did diurese significantly yesterday. We want to be sure that she does not become too dry. I have decreased her Lasix to 40 mg IV once daily. The dosing of this will need to be reviewed  daily.    CHRONIC KIDNEY DISEASE STAGE V        Creatinine is improved today from 2.1-1.8 with her diuresis. Chemistry will be checked again tomorrow.    FIBRILLATION, ATRIAL    Diltiazem dose was increased yesterday. Her rate is between 60 and 65. We do not want to slow, but with her vigorous left ventricular function, she will probably do better on the slower side. I've decided to leave her diltiazem dose the same today. If her rate is too slow or her blood pressure is too low, this dose will have to be decreased. The plan for now will be to watch her overall volume status and renal function heart rate and blood pressure.    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 There is a high gradient in the range of 100 mmHg at rest in the left ventricular cavity. There is no definite left intraocular outflow tract gradient.    Warfarin anticoagulation    Hypokalemia    Potassium is low and needs to be supplemented. I have not written the orders. I will leave this to critical care medicine overseeing feeding and fluids.  Willa Rough 12/05/2012 7:03 AM

## 2012-12-06 ENCOUNTER — Inpatient Hospital Stay (HOSPITAL_COMMUNITY): Payer: Medicare Other

## 2012-12-06 DIAGNOSIS — E870 Hyperosmolality and hypernatremia: Secondary | ICD-10-CM

## 2012-12-06 LAB — CBC
HCT: 28.8 % — ABNORMAL LOW (ref 36.0–46.0)
MCHC: 32.3 g/dL (ref 30.0–36.0)
MCV: 83.2 fL (ref 78.0–100.0)
RDW: 18.3 % — ABNORMAL HIGH (ref 11.5–15.5)

## 2012-12-06 LAB — PROTIME-INR: Prothrombin Time: 26.2 seconds — ABNORMAL HIGH (ref 11.6–15.2)

## 2012-12-06 LAB — BASIC METABOLIC PANEL
BUN: 66 mg/dL — ABNORMAL HIGH (ref 6–23)
CO2: 24 mEq/L (ref 19–32)
Chloride: 116 mEq/L — ABNORMAL HIGH (ref 96–112)
Creatinine, Ser: 1.9 mg/dL — ABNORMAL HIGH (ref 0.50–1.10)

## 2012-12-06 LAB — GLUCOSE, CAPILLARY
Glucose-Capillary: 181 mg/dL — ABNORMAL HIGH (ref 70–99)
Glucose-Capillary: 241 mg/dL — ABNORMAL HIGH (ref 70–99)

## 2012-12-06 NOTE — Progress Notes (Signed)
[facility] - Treatment Patient Details  Name: [name] MRN: [id number] Date of Birth: May 29, 1940  Today's Date: 12/06/2012 Time: 1600-     Past Medical History:  Past Medical History  Diagnosis Date  . CAD (coronary artery disease)     a. Nonobst by cath 11/11: LAD 40%, mid-dist 25-30%; prox CFX 30%, mid 40%; OM 50%; PDA 50%; PLV 50%;  EF 65%.  . NSTEMI (non-ST elevated myocardial infarction)     a. In setting of AFib with RVR 03/2010, type 2. b. Again in 11/2011 felt 2/2 afib.  . Atrial fibrillation     a. DCCV 11/17/11. b. On coumadin & amiodarone.  Marland Kitchen HOCM (hypertrophic obstructive cardiomyopathy)     a. Echo 11/2011:severe LVH, EF 65-70%, mid-cavity gradient up to . b. Echo 06/2012: EF 65-70%, severe LVH, grade 2 d/dysf.  Marland Kitchen Hypertension   . Tobacco abuse   . CKD (chronic kidney disease), stage IV   . Diastolic CHF     preserved LVF  . Iron deficiency anemia   . Third degree uterine prolapse   . Hx MRSA infection   . Hx of cardiovascular stress test     a. Lex MV 12/13:  EF 56%, no ischemia    . Diabetes   . Hyperlipidemia   . Respiratory failure     Multiple admissions for resp failure requiring intubation.  . Hypercalcemia   . LBBB (left bundle branch block)     History of transient LBBB  . C. difficile colitis 01/2012   Past Surgical History:  Past Surgical History  Procedure Laterality Date  . Tubal ligation    . Cardioversion  11/17/2011    Procedure: CARDIOVERSION;  Surgeon: Hillis Range, MD;  Location: [facility] OR;  Service: Cardiovascular;  Laterality: N/A;    Assessment / Plan / Recommendation Clinical Impression  Much improved tolerance today.  Pt. was able to tolerate cuff deflation for 45 minutes and PMSV for 30 minutes with all VSS.  Pt. obtained phonation and answered direct questions intermittently (sometimes not responding due to poor attention).  Intelligibility was reduced at the phrase level.  Pt. was oriented to person only, and  yes/no's re: biographical information and immediate environment were accurate when she responded, however, frequently pt. would not respond.  Overall, pt. tolerated the PMSV well.  Will attempt FEES or MBS 7/31.    Plan       Follow Up Recommendations       Pertinent Vitals/Pain n/a    SLP Goals SLP Goal #1 - Progress: Met SLP Goal #2 - Progress: Met SLP Goal #3 - Progress: Met SLP Goal #4: New goal 7/31- Pt. will wear PMSV with all VSS for 30-45 with all therapies. SLP Goal #5: Pt. will complete swallow study and speech/language/cognitive evaluation with PMSV in place.   PMSV Trial  PMSV was placed for: 30 minutes with intermittent breaks Able to redirect subglottic air through upper airway: Yes Able to Attain Phonation: Yes Voice Quality: Normal;Low vocal intensity Able to Expectorate Secretions: Yes Level of Secretion Expectoration with PMSV: Oral Breath Support for Phonation: Mildly decreased Intelligibility: Intelligibility reduced Word: 75-100% accurate Phrase: 50-74% accurate Sentence: 25-49% accurate Conversation:  (Responds inconsistely to questions; does not converse.) Respirations During Trial: 24 (fluctuated from 19-32) SpO2 During Trial: 98 % (Fluctuated from 93-100% (mostly high 90's)) Pulse During Trial: 85 Behavior: Alert;Confused;Listless;Smiling   Tracheostomy Tube       Vent Dependency  FiO2 (%): 28 %  Cuff Deflation Trial  GO     Tolerated Cuff Deflation: Yes Length of Time for Cuff Deflation Trial: 45 minutes (Cuff was left deflated) Behavior: Alert;Confused;Cooperative;Poor eye contact;Listless Cuff Deflation Trial - Comments:  (Much improved over the initial trial.)   [name] T 12/06/2012, 4:55 PM

## 2012-12-06 NOTE — Progress Notes (Signed)
5 beat run of VTach. VSS, Critical Care Team notified. Will continue to monitor and evaluate treatment effectiveness

## 2012-12-06 NOTE — Progress Notes (Signed)
PULMONARY  / CRITICAL CARE MEDICINE  Name: [name] MRN: [id number] DOB: Apr 20, 1941    ADMISSION DATE:  11/09/2012 CONSULTATION DATE:  11/09/2012  REFERRING MD :  Preston Fleeting PRIMARY SERVICE: PCCM  CHIEF COMPLAINT:  Shortness of breath.  BRIEF PATIENT DESCRIPTION: 72 y/o female with CHF was admitted on 7/3 from the [facility] ED with acute hypoxemic respiratory failure due to a CHF exacerbation, ?Superimposed PNA.  LINES / TUBES: 7/3 ETT >>11/13/12, 11/13/12  (failed extubation immediately, ? aspirated) >> 7/17 7/4 CVC R IJ >> 7/17 7/5 A-line >> 7/18 7/17 Trach (DF) >> 7/17 Rt PICC >>  7/22 PEG >>>  CULTURES: 7/4 U strep >> neg 7/4 legionella >> neg 7/3 mrsa PCR >> POSITIVE 7/8 mini-BAL >> yeast 7/16 sputum >> yeast  7/16 blood >> neg 7/16 urine >> yeast 50 K colonies 7/26 C. Diff>>>neg  ANTIBIOTICS: 7/3 Ceftriaxone >>7/6 7/4 levofloxacin >>7/7 7/4 Vancomycin >>7/6, restarted 7/8 >> 7/18 7/7 Unasyn >> 7/16 7/16 cefepime >> 7/24  SIGNIFICANT EVENTS: 7/3 admission 7/07 reintubated immediately after extubation.  ? Aspiration. ABX restarted.  on Neo-Synephrine.  7/09 Able to wean fio2 and neo. Lopressor required for rate control of afib. 7/10 Off neo, but now tachycardic.  Failed SBT this AM, but RT felt it was due to sedation.  Sedation weaned 7/14 Hypotensive, vomiting, amiodarone turned off for low heart rate 7/16 Change to pressure control 7/17 Start pressors, PRBC transfusion, trach 7/18 Off pressors, PRBC transfusion 7/28- 10-12 hrs trach collar trial 7/29- recs from surgery for pancreatic lesions obtained  STUDIES: 7/16 CT chest >> emphysema, b/l ASD, small effusions 7/16 CT abd/pelvis >> 2.6 x 2 cm nodule in head of pancreas, bone changes suggestive of Paget's disease  SUBJECTIVE: Patient awake, nods to response to questions, denies any pain.  VITAL SIGNS: Temp:  [97.9 F (36.6 C)-98.5 F (36.9 C)] 98.5 F (36.9 C) (07/30 0714) Pulse Rate:  [68-85] 78 (07/30  0908) Resp:  [16-26] 26 (07/30 0908) BP: (113-133)/(52-71) 113/69 mmHg (07/30 0908) SpO2:  [92 %-100 %] 97 % (07/30 0908) FiO2 (%):  [28 %-35 %] 28 % (07/30 0908) Weight:  [177 lb 4 oz (80.4 kg)-178 lb 9.2 oz (81 kg)] 177 lb 4 oz (80.4 kg) (07/30 0444)  HEMODYNAMICS: CVP:  [6 mmHg-8 mmHg] 8 mmHg  VENTILATOR SETTINGS: Vent Mode:  [-]  FiO2 (%):  [28 %-35 %] 28 %  INTAKE / OUTPUT: Intake/Output     07/29 0701 - 07/30 0700 07/30 0701 - 07/31 0700   I.V. (mL/kg) 669.7 (8.3) 60 (0.7)   NG/GT 1140 20   Total Intake(mL/kg) 1809.7 (22.5) 80 (1)   Urine (mL/kg/hr) 1700 (0.9)    Total Output 1700     Net +109.7 +80        Stool Occurrence 3 x     PHYSICAL EXAMINATION: Gen: Laying in bed, in NAD on trach collar HEENT: PEG in place, trach site clean PULM: course improved after neg  balance CV: irregular, no m/r/g AB: soft, non tender, BS+ Ext: no edema Neuro: RASS 0, moves extremities, follows simple commands intermittent  LABS: CBC Recent Labs     12/04/12  0400  12/05/12  0500  12/06/12  0500  WBC  14.9*  12.7*  17.0*  HGB  8.0*  8.1*  9.3*  HCT  24.4*  25.6*  28.8*  PLT  147*  136*  216   Coag's Recent Labs     12/04/12  0400  12/05/12  0500  12/06/12  0500  INR  2.20*  2.64*  2.50*   BMET Recent Labs     12/04/12  1100  12/05/12  0500  12/06/12  0500  NA  145  151*  151*  K  3.8  3.0*  3.0*  CL  112  114*  116*  CO2  23  25  24   BUN  67*  64*  66*  CREATININE  2.10*  1.88*  1.90*  GLUCOSE  238*  76  69*   Electrolytes Recent Labs     12/04/12  1100  12/05/12  0500  12/06/12  0500  CALCIUM  11.8*  11.1*  12.4*  MG   --   1.7   --   PHOS   --   4.9*   --    Glucose Recent Labs     12/05/12  1136  12/05/12  1527  12/05/12  2028  12/05/12  2339  12/06/12  0413  12/06/12  0717  GLUCAP  188*  172*  300*  281*  73  81    CXR: 7/30 - no significant change - continues to to have mild interstitial densities bil  ASSESSMENT /  PLAN:  PULMONARY A:  Acute respiratory failure 2nd to pulmonary infiltrates, resolving. effusions P:   - continue wean on TC 28%, dc vent from room - PRN BD's. -hold lasix, see renal -control rate to avoid diastolic CHF - pt on trach collar overnight and tolerated well, consider change to 6 cuffless after 24-48 hrs further without vent  CARDIOVASCULAR A:  HOCM with acute on chronic diastolic heart failure. A fib with RVR >> amiodarone d/c due to bradycardia, hypotension. Hx of CAD, HTN, hyperlipidemia. Shock >> resolved P:  - Continue IV lasix PO 40 mg PO BID. - Anticoagulation managed by Cards. Continue on Warfarin and heparin dose. - Continue Cardizem PO for rate control 30 q6 hours. -if VT re occur, consider beta blocker escalation  RENAL A: Acute kidney injury >> renal fx stable. Hypernatremia  Hypokalemia Hypercalcemia  Hyperphosphatemia Mag WNL P:   - KVO IV fluids. - Monitor renal fx, urine outpt, as we dc lasix - F/U electrolytes in AM -replace K+ -maintain free water and lasix, may need some reduction -continue d5w slight at 30   GASTROINTESTINAL A:   Protein malnutrition. Vomiting - resolved Mass head of pancreas-- Will need further assessment of pancreatic lesion when more stable, likely in LTAC or outpatient, Ca 19-9 elevated (73.1)- concern cancer P:   - PEG tube  - Pepcid for SUP - gen sx consulted, said patient will need outpt endoscopy, cardiology consult to determine if patient is a surgical candidate and LFTs drawn  GYN A:  Uterine prolapse. P: - GYN consult called on Friday, prolapse per GYN.  HEMATOLOGIC A:  Anemia of chronic disease and critical illness >> PRBC transfusion 7/17, 7/18. Thrombocytopenia  hemoconcetration 7/28 Leukocytosis - WBC 17.0 on 7/30, no fever, most likely due to hemoconcentration  P:  - Monitor CBC in AM - Continue coumadin.  INFECTIOUS A: Initial concern for aspiration pneumonia >> has persistent  pulmonary infiltrates, and increasing WBC. P:   -no abx, follow fever curve  ENDOCRINE A:  DM2 P:   - SSI  NEUROLOGIC A: Acute encephalopathy 2nd to hypoxia/hypercapnia. Muscular deconditioning. P:   - PT/OT, progress , especialy as neuro better and trach collar successful Consider downsize in 48 hrs trach pmv / speech eval  Today's summary: speech, pmv, maintain  TC, dc lasix, consider change to 6 cuffless in a day or so  [employer] PA-S  I have fully examined this patient and agree with above findings.    And edited in full  Mcarthur Rossetti. Tyson Alias, MD, FACP Pgr: [phone] [facility]

## 2012-12-06 NOTE — Progress Notes (Signed)
ANTICOAGULATION CONSULT NOTE - Follow Up Consult  Pharmacy Consult for Warfarin Indication: atrial fibrillation  No Known Allergies  Patient Measurements: Height: 5\' 2"  (157.5 cm) Weight: 177 lb 4 oz (80.4 kg) IBW/kg (Calculated) : 50.1  Vital Signs: Temp: 98.5 F (36.9 C) (07/30 0714) Temp src: Axillary (07/30 0714) BP: 113/69 mmHg (07/30 0908) Pulse Rate: 78 (07/30 0908)  Labs:  Recent Labs  12/04/12 0400 12/04/12 1100 12/05/12 0500 12/06/12 0500  HGB 8.0*  --  8.1* 9.3*  HCT 24.4*  --  25.6* 28.8*  PLT 147*  --  136* 216  LABPROT 23.7*  --  27.3* 26.2*  INR 2.20*  --  2.64* 2.50*  CREATININE 2.19* 2.10* 1.88* 1.90*    Estimated Creatinine Clearance: 26.3 ml/min (by C-G formula based on Cr of 1.9).   Assessment: 72 y.o. F who continues on warfarin for anticoagulation with hx Afib. INR this morning is therapeutic(INR 2.5<<2.64 << 2.2, goal of 2-3).  Goal of Therapy:  INR 2-3   Plan:  1. Continue warfarin 2.5 mg daily 2. Will continue to monitor for any signs/symptoms of bleeding and will follow up with PT/INR in the a.m.   Thank you. Okey Regal, PharmD [phone]  12/06/2012 11:12 AM

## 2012-12-06 NOTE — Progress Notes (Signed)
Patient ID: [name], female   DOB: April 14, 1941, 72 y.o.   MRN: [id number]   SUBJECTIVE:  Atrial fibrillation rate is controlled. Blood pressure stable.   Filed Vitals:   12/06/12 0117 12/06/12 0411 12/06/12 0444 12/06/12 0510  BP:      Pulse: 77   81  Temp:  97.9 F (36.6 C)    TempSrc:  Oral    Resp: 19   23  Height:      Weight:  178 lb 9.2 oz (81 kg) 177 lb 4 oz (80.4 kg)   SpO2: 94%   95%    Intake/Output Summary (Last 24 hours) at 12/06/12 0709 Last data filed at 12/06/12 0000  Gross per 24 hour  Intake 1229.67 ml  Output   1700 ml  Net -470.33 ml    LABS: Basic Metabolic Panel:  Recent Labs  46/96/29 0500 12/06/12 0500  NA 151* 151*  K 3.0* 3.0*  CL 114* 116*  CO2 25 24  GLUCOSE 76 69*  BUN 64* 66*  CREATININE 1.88* 1.90*  CALCIUM 11.1* 12.4*  MG 1.7  --   PHOS 4.9*  --    Liver Function Tests: No results found for this basename: AST, ALT, ALKPHOS, BILITOT, PROT, ALBUMIN,  in the last 72 hours No results found for this basename: LIPASE, AMYLASE,  in the last 72 hours CBC:  Recent Labs  12/04/12 0400 12/05/12 0500 12/06/12 0500  WBC 14.9* 12.7* 17.0*  NEUTROABS 13.2*  --   --   HGB 8.0* 8.1* 9.3*  HCT 24.4* 25.6* 28.8*  MCV 83.3 83.4 83.2  PLT 147* 136* 216   Cardiac Enzymes: No results found for this basename: CKTOTAL, CKMB, CKMBINDEX, TROPONINI,  in the last 72 hours BNP: No components found with this basename: POCBNP,  D-Dimer: No results found for this basename: DDIMER,  in the last 72 hours Hemoglobin A1C: No results found for this basename: HGBA1C,  in the last 72 hours Fasting Lipid Panel: No results found for this basename: CHOL, HDL, LDLCALC, TRIG, CHOLHDL, LDLDIRECT,  in the last 72 hours Thyroid Function Tests: No results found for this basename: TSH, T4TOTAL, FREET3, T3FREE, THYROIDAB,  in the last 72 hours  RADIOLOGY: Ct Abdomen Pelvis Wo Contrast  11/22/2012   *RADIOLOGY REPORT*  Clinical Data:  Pulmonary infiltrates  with abdominal pain.  CT CHEST, ABDOMEN AND PELVIS WITHOUT CONTRAST  Technique:  Multidetector CT imaging of the chest, abdomen and pelvis was performed following the standard protocol without IV contrast.  Comparison:   None.  CT CHEST  Findings:  Tracheostomy tube tip is in the trachea above the level of the bifurcation.  NG tube tip is in the distal stomach.  No axillary lymphadenopathy.  Scattered small lymph nodes are seen in the mediastinum.  Hilar lymphadenopathy cannot be excluded on this study without intravenous contrast material.  The heart is enlarged. Coronary artery calcification is noted.  No pericardial effusion.  Small bilateral pleural effusions are evident.  Lung windows show advanced underlying emphysema.  There is a peripheral interstitial and airspace disease with bilateral dependent collapse / consolidation.  The dense airspace consolidation mainly involves the lower lobes.  Bone windows reveal no worrisome lytic or sclerotic osseous lesions.  IMPRESSION: Emphysema with peripheral interstitial and airspace disease associated with bilateral lower lobe collapse / consolidation. Underlying pulmonary edema not excluded.  Small bilateral pleural effusions.  CT ABDOMEN AND PELVIS  Findings:  No focal abnormalities seen in the liver or spleen. Duodenum  and adrenal glands are unremarkable.  The gallbladder lumen is filled with high attenuation material which may be from vicarious excretion of previous contrast material.  Right kidney is unremarkable.  There is mild fullness of the left intrarenal collecting system.  2.6 x 2.0 cm apparent soft tissue nodule is seen along the inferior head of pancreas.  This is difficult to assess given motion artifact and lack of intravenous contrast material.  No abdominal aortic aneurysm.  No free fluid in the abdomen.  No evidence for small bowel obstruction.  Imaging through the pelvis shows no free intraperitoneal fluid. Foley catheter decompresses the urinary  bladder.  Uterus is surgically absent.  There is no adnexal mass.  There is some diverticular change in the left colon without diverticulitis. Terminal ileum is normal.  The appendix is normal.  Abnormal soft tissue attenuation is seen at the umbilicus.  There is some mild body wall edema in the region of the pelvis.  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  No worrisome lytic or sclerotic osseous abnormality.  IMPRESSION: 2.6 cm soft tissue nodule along the inferior head of pancreas. This is not completely characterized given motion artifact and lack of intravenous contrast material. Pancreatic neoplasm not excluded.  Soft tissue attenuation of the umbilicus.  This could be related to infection or scar.  This should be amendable to clinical inspection.  Mild fullness of the left intrarenal collecting system is of indeterminate etiology/significance.  Underlying component of UPJ obstruction would be a consideration.  Mild body wall edema.   Original Report Authenticated By: Kennith Center, M.D.   Ct Chest Wo Contrast  11/22/2012   *RADIOLOGY REPORT*  Clinical Data:  Pulmonary infiltrates with abdominal pain.  CT CHEST, ABDOMEN AND PELVIS WITHOUT CONTRAST  Technique:  Multidetector CT imaging of the chest, abdomen and pelvis was performed following the standard protocol without IV contrast.  Comparison:   None.  CT CHEST  Findings:  Tracheostomy tube tip is in the trachea above the level of the bifurcation.  NG tube tip is in the distal stomach.  No axillary lymphadenopathy.  Scattered small lymph nodes are seen in the mediastinum.  Hilar lymphadenopathy cannot be excluded on this study without intravenous contrast material.  The heart is enlarged. Coronary artery calcification is noted.  No pericardial effusion.  Small bilateral pleural effusions are evident.  Lung windows show advanced underlying emphysema.  There is a peripheral interstitial and airspace disease with  bilateral dependent collapse / consolidation.  The dense airspace consolidation mainly involves the lower lobes.  Bone windows reveal no worrisome lytic or sclerotic osseous lesions.  IMPRESSION: Emphysema with peripheral interstitial and airspace disease associated with bilateral lower lobe collapse / consolidation. Underlying pulmonary edema not excluded.  Small bilateral pleural effusions.  CT ABDOMEN AND PELVIS  Findings:  No focal abnormalities seen in the liver or spleen. Duodenum and adrenal glands are unremarkable.  The gallbladder lumen is filled with high attenuation material which may be from vicarious excretion of previous contrast material.  Right kidney is unremarkable.  There is mild fullness of the left intrarenal collecting system.  2.6 x 2.0 cm apparent soft tissue nodule is seen along the inferior head of pancreas.  This is difficult to assess given motion artifact and lack of intravenous contrast material.  No abdominal aortic aneurysm.  No free fluid in the abdomen.  No evidence for small bowel obstruction.  Imaging through the pelvis shows no free intraperitoneal  fluid. Foley catheter decompresses the urinary bladder.  Uterus is surgically absent.  There is no adnexal mass.  There is some diverticular change in the left colon without diverticulitis. Terminal ileum is normal.  The appendix is normal.  Abnormal soft tissue attenuation is seen at the umbilicus.  There is some mild body wall edema in the region of the pelvis.  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  No worrisome lytic or sclerotic osseous abnormality.  IMPRESSION: 2.6 cm soft tissue nodule along the inferior head of pancreas. This is not completely characterized given motion artifact and lack of intravenous contrast material. Pancreatic neoplasm not excluded.  Soft tissue attenuation of the umbilicus.  This could be related to infection or scar.  This should be amendable to clinical  inspection.  Mild fullness of the left intrarenal collecting system is of indeterminate etiology/significance.  Underlying component of UPJ obstruction would be a consideration.  Mild body wall edema.   Original Report Authenticated By: Kennith Center, M.D.   Ir Gastrostomy Tube Mod Sed  11/28/2012   *RADIOLOGY REPORT*  Indication:  Malnutrition  PULL TROUGH GASTOSTOMY TUBE PLACEMENT  Comparison: Abdominal radiograph - earlier same day; CT abdomen pelvis - 11/22/2012  Medications:  Versed 2 mg IV; Fentanyl 25 mcg IV; the patient is currently admitted to the hospital receiving intravenous antibiotic; Antibiotics were administered within 1 hour of the procedure.  Contrast volume:  20 mL Omnipaque-300 administered into the gastric lumen  Sedation time: 8 minutes  Fluoroscopy time: 1 minute, 48 seconds  Complications: None immediate  PROCEDURE/FINDINGS:  Informed written consent was obtained from the patient's family following explanation of the procedure, risks, benefits and alternatives.  A time out was performed prior to the initiation of the procedure.  Maximal barrier sterile technique utilized including caps, mask, sterile gowns, sterile gloves, large sterile drape, hand hygiene and Betadine prep.  The left upper quadrant was sterilely prepped and draped.  An oral gastric catheter was inserted into the stomach under fluoroscopy. The existing nasogastric feeding tube was removed.  The left costal margin and barium opacified transverse colon were identified and avoided.  Air was injected into the stomach for insufflation and visualization under fluoroscopy.  Under sterile conditions a 17 gauge trocar needle was utilized to access the stomach percutaneously beneath the left subcostal margin after the overlying soft tissues were anesthetized with 1% Lidocaine with epinephrine.  Needle position was confirmed within the stomach with aspiration of air and injection of small amount of contrast.   A single T tack was  deployed for gastropexy.  Over an Amplatz guide wire, a 9-French sheath was inserted into the stomach.  A snare device was utilized to capture the oral gastric catheter.  The snare device was pulled retrograde from the stomach up the esophagus and out the oropharynx.  The 20-French pull-through gastrostomy was connected to the snare device and pulled antegrade through the oropharynx down the esophagus into the stomach and then through the percutaneous tract external to the patient.  The gastrostomy was assembled externally.  Contrast injection confirms position in the stomach.   Several spot radiographic images were obtained in various obliquities for documentation.  The patient tolerated procedure well without immediate post procedural complication.  IMPRESSION:  Successful fluoroscopic insertion of a 20-French "pull-through" gastrostomy.   Original Report Authenticated By: Tacey Ruiz, MD   Dg Chest Port 1 View  12/06/2012   *RADIOLOGY REPORT*  Clinical Data: Tracheostomy tube  placement.  PORTABLE CHEST - 1 VIEW  Comparison: December 05, 2012.  Findings: Tracheostomy tube is in grossly good position.  Right- sided PICC line is unchanged in position.  Stable cardiomegaly is noted.  Mild interstitial densities are noted throughout both lungs which are not changed compared to prior exam.  No pneumothorax or pleural effusion is noted.  IMPRESSION: No significant change compared to prior exam.   Original Report Authenticated By: Lupita Raider.,  M.D.   Dg Chest Port 1 View  12/05/2012   *RADIOLOGY REPORT*  Clinical Data: Follow up edema versus pneumonia and bilateral effusions.  PORTABLE CHEST - 1 VIEW  Comparison: Portable chest x-rays 12/05/2012 dating back to 11/24/2012.  Findings: Tracheostomy tube tip in satisfactory position below the thoracic inlet.  Right arm PICC tip projects over the mid SVC. Cardiac silhouette enlarged but stable.  Improved aeration in both lung bases, with only mild atelectasis  persisting.  Near complete resolution of bilateral pleural effusions.  No new pulmonary parenchymal abnormalities.  IMPRESSION: Support apparatus satisfactory.  Improved aeration in the lung bases with only mild atelectasis persisting.  Essential resolution of bilateral pleural effusions.  No new abnormalities.   Original Report Authenticated By: Hulan Saas, M.D.   Dg Chest Port 1 View  12/03/2012   *RADIOLOGY REPORT*  Clinical Data: Evaluate for possible aspiration pneumonia.  PORTABLE CHEST - 1 VIEW  Comparison: Chest x-ray 11/30/2012.  Findings: A tracheostomy tube is in place with tip 4.3 cm above the carina. There is a right upper extremity PICC with tip terminating in the superior cavoatrial junction. Lung volumes are low. Widespread multifocal interstitial and airspace disease throughout the lungs bilaterally, asymmetrically distributed with relative sparing of the left upper lobe.  Moderate bilateral pleural effusions.  Mild cardiomegaly. The patient is rotated to the left on today's exam, resulting in distortion of the mediastinal contours and reduced diagnostic sensitivity and specificity for mediastinal pathology.  IMPRESSION: 1.  Support apparatus, as above. 2.  Patchy multifocal interstitial and airspace disease asymmetrically distributed throughout the lungs bilaterally, concerning for multilobar pneumonia. 3.  Moderate bilateral pleural effusions. 4.  Mild cardiomegaly.   Original Report Authenticated By: Trudie Reed, M.D.   Dg Chest Port 1 View  11/30/2012   *RADIOLOGY REPORT*  Clinical Data: Evaluate tracheostomy  PORTABLE CHEST - 1 VIEW  Comparison: Portable chest x-ray of 11/25/2012  Findings: The tracheostomy is unchanged in position with the tip overlying the tracheal air shadow well above the carina.  Right PICC line tip is noted to the lower SVC.  Cardiomegaly and pulmonary vascular congestion with effusions remain consistent with mild congestive heart failure.  IMPRESSION:  1.   No change in mild congestive heart failure with effusions. 2.  No change in position of tracheostomy and right central venous line.   Original Report Authenticated By: Dwyane Dee, M.D.   Dg Chest Port 1 View  11/25/2012   *RADIOLOGY REPORT*  Clinical Data: Follow up pneumonia.  PORTABLE CHEST - 1 VIEW  Comparison: Chest radiograph performed 11/24/2012  Findings: The lungs are relatively well expanded.  Diffuse bilateral airspace opacification is again noted, worse on the right.  This may reflect multifocal pneumonia or pulmonary edema. Small bilateral pleural effusions are noted.  No pneumothorax is seen.  The cardiomediastinal silhouette is mildly enlarged.  The patient's tracheostomy tube is seen ending 4-5 cm above the carina.  Enteric tube is noted extending below the diaphragm.  No acute osseous abnormalities are seen.  IMPRESSION:  1.  Diffuse bilateral airspace opacification again noted, worse on the right.  This is perhaps slightly worsened from the prior study, and may reflect multifocal pneumonia or pulmonary edema.  Small bilateral pleural effusions noted. 2.  Mild cardiomegaly.   Original Report Authenticated By: Tonia Ghent, M.D.   Dg Chest Port 1 View  11/24/2012   *RADIOLOGY REPORT*  Clinical Data: Follow up pneumonia.  Intubated patient.  Tube placement.  PORTABLE CHEST - 1 VIEW  Comparison: 11/23/2012.  Findings: The cardiopericardial silhouette is enlarged.  There is diffuse right greater than left bilateral airspace disease.  This has progressed compared yesterday's examination.  The endotracheal tube remains present with the tip 6.3 cm from the carina.  Right upper extremity PICC is present with the tip at the level of the carina in the mid SVC.  Enteric tube is present.  There is probably a small right pleural effusion present.  IMPRESSION:  1.  Stable support apparatus. 2.  Worsening bilateral right greater than left airspace disease. This may represent multi focal pneumonia and / or  edema associated with CHF in this patient with cardiomegaly.   Original Report Authenticated By: Andreas Newport, M.D.   Chest Portable 1 View To Assess Tube Placement And Rule-out Pneumothorax  11/23/2012   *RADIOLOGY REPORT*  Clinical Data: Evaluate tube placement  PORTABLE CHEST - 1 VIEW  Comparison: 11/23/2012  Findings: The tracheostomy tube is midline and the tip is above the carina.  There is a nasogastric tube with tip in the stomach.  Right arm PICC line tip is in the cavoatrial junction.  The right IJ catheter tip is in the projection of the SVC.  Heart size is moderately enlarged.  There are bilateral pleural effusions and interstitial edema consistent with CHF.  IMPRESSION:  1. Satisfactory position of the support apparatus including the tracheostomy tube 2.  Moderate CHF.   Original Report Authenticated By: Signa Kell, M.D.   Dg Chest Port 1 View  11/23/2012   *RADIOLOGY REPORT*  Clinical Data: Pulmonary infiltrates, follow-up  PORTABLE CHEST - 1 VIEW  Comparison: CT chest of 11/22/2012 and chest x-ray of the same date  Findings: The tip of the endotracheal tube is approximately 5.8 cm above the carina.  The lungs appear slightly better aerated although diffuse airspace disease remains.  Haziness at the lung bases is most consistent with the presence of bilateral pleural effusions, and cardiomegaly is stable.  The right central venous line is unchanged in position.  IMPRESSION: Slightly improved aeration.  Persistent airspace disease with probable effusions.   Original Report Authenticated By: Dwyane Dee, M.D.   Dg Chest Port 1 View  11/22/2012   *RADIOLOGY REPORT*  Clinical Data: CHF, ventilatory support  PORTABLE CHEST - 1 VIEW  Comparison: 11/21/2012  Findings: Stable support apparatus.  Cardiomegaly with a similar pattern of diffuse edema.  No enlarging effusion or pneumothorax. No significant interval change.  IMPRESSION: Stable CHF pattern.   Original Report Authenticated By: Judie Petit. Miles Costain,  M.D.   Dg Chest Port 1 View  11/21/2012   *RADIOLOGY REPORT*  Clinical Data: Respiratory failure; diminished lung sounds.  PORTABLE CHEST - 1 VIEW  Comparison: Chest radiograph performed 11/20/2012  Findings: The patient's endotracheal tube is seen ending 4 cm above the carina.  The lungs are relatively well expanded.  Vascular congestion is noted, with diffusely increased interstitial markings, concerning for persistent pulmonary edema.  Small bilateral pleural effusions are noted.  Findings are redistributed from the prior study, but otherwise  grossly unchanged.  No pneumothorax is seen.  The cardiomediastinal silhouette is mildly enlarged.  No acute osseous abnormalities are seen.  The patient's enteric tube is noted extending below the diaphragm.  The right IJ line is noted ending about the distal SVC.  IMPRESSION: Vascular congestion and mild cardiomegaly, with diffusely increased interstitial markings, concerning for persistent pulmonary edema. Small bilateral pleural effusions noted.  There has been mild interval redistribution of airspace opacities, but findings are otherwise grossly unchanged.   Original Report Authenticated By: Tonia Ghent, M.D.   Dg Chest Port 1 View  11/20/2012   *RADIOLOGY REPORT*  Clinical Data: Endotracheal tube placement  PORTABLE CHEST - 1 VIEW  Comparison: Portable exam 1019 hours compared to 11/18/2012  Findings: Rotated to the right. Tip of endotracheal tube projects 5.5 cm above carina. Nasogastric tube extends to at least the inferior mediastinum, unable to see beyond. Enlargement of cardiac silhouette. Tortuous aorta. Scattered infiltrates question edema. Minimal fluid or atelectasis at minor fissure. No gross pneumothorax.  IMPRESSION: Tip of endotracheal tube projects 5.5 cm above carina. Enlargement of cardiac silhouette with scattered infiltrates question edema.   Original Report Authenticated By: Ulyses Southward, M.D.   Dg Chest Port 1 View  11/18/2012   *RADIOLOGY  REPORT*  Clinical Data: Evaluate lungs.  PORTABLE CHEST - 1 VIEW  Comparison: 11/17/2012  Findings: Endotracheal tube, enteric tube, and right central venous catheter appear unchanged in position.  Shallow inspiration. Cardiac enlargement with pulmonary vascular congestion.  No edema or consolidation.  No blunting of costophrenic angles.  No pneumothorax.  Tortuous aorta.  IMPRESSION: Appliances remain in satisfactory apparent location.  Cardiac enlargement with mild pulmonary vascular congestion.   Original Report Authenticated By: Burman Nieves, M.D.   Dg Chest Port 1 View  11/17/2012   *RADIOLOGY REPORT*  Clinical Data: Endotracheal tube placement  PORTABLE CHEST - 1 VIEW  Comparison: November 16, 2012.  Findings: Endotracheal tube is in grossly good position with distal tip approximately 4 cm above the carina.  Nasogastric tube passes through esophagus into stomach.  Right internal jugular catheter line is unchanged in position.  Stable cardiomediastinal silhouette.  No pneumothorax or pleural effusion is noted.  No acute pulmonary disease is noted.  IMPRESSION: Endotracheal tube in grossly good position.  No significant change compared to prior exam.   Original Report Authenticated By: Lupita Raider.,  M.D.   Dg Chest Port 1 View  11/16/2012   *RADIOLOGY REPORT*  Clinical Data: Evaluation of endotracheal tube.  History of coronary artery disease, atrial fibrillation, hypertension, and tobacco use.  PORTABLE CHEST - 1 VIEW  Comparison: 11/14/2012.  Findings: Tip of endotracheal tube terminates 3 cm above carina. Enteric tube is in place.  Tip is not included on the image.  Tip of right internal jugular venous catheter terminates in the lower portion of the superior vena cava near the cavoatrial junction.  No pneumothorax is evident.  There is cardiac silhouette enlargement which appears larger than on previous study.  Aortic ectasia is present.  There is slight upper lobe vascular prominence congestion  pattern accentuated by semi-erect positioning.  There is decrease in the infiltrative densities within the right lung with decrease in the edema pattern.  There is elevation of the left hemidiaphragm with minimal atelectasis and infiltrative density in left base. There is decrease in the right pleural effusion with only slight residual blunting of the right costophrenic angle.  There is slight indistinctness of left costophrenic angle which may reflect small amount  of left pleural fluid.  Osteopenic appearance of bones.  IMPRESSION: Tip of endotracheal tube terminates 3 cm above carina.  Enteric tube and right internal jugular venous catheter in place with details given above.  Cardiac silhouette enlargement which appears larger than on previous study.  Mild upper lobe vessel prominence accentuated by semi-erect positioning.  Decrease in the infiltrative densities within the right lung with decrease in the edema pattern.  There is elevation of the left hemidiaphragm with minimal atelectasis and infiltrative density in left base.  There is decrease in the right pleural effusion with only slight residual blunting of the right costophrenic angle.  There is slight indistinctness of left costophrenic angle which may reflect small amount of left pleural fluid.   Original Report Authenticated By: Onalee Hua Call   Dg Chest Port 1 View  11/14/2012   *RADIOLOGY REPORT*  Clinical Data: Shortness of breath.  PORTABLE CHEST - 1 VIEW  Comparison: 11/13/2012  Findings: Endotracheal tube tip measures about 3.6 cm above the carina.  Enteric tube tip is out of the field of view but below the left hemidiaphragm.  Right central venous catheter tip in the cavoatrial junction.  Cardiac enlargement with mild pulmonary vascular congestion and perihilar edema.  Edema pattern appears slightly improved.  Probable right pleural effusion.  IMPRESSION: Appliances appear in satisfactory position.  Cardiac enlargement with pulmonary vascular  congestion and improving edema.  Right pleural effusion.   Original Report Authenticated By: Burman Nieves, M.D.   Dg Chest Port 1 View  11/13/2012   *RADIOLOGY REPORT*  Clinical Data: ET tube placement.  PORTABLE CHEST - 1 VIEW  Comparison: Chest x-ray from earlier the same date.  Findings: Endotracheal tube terminates in the mid thoracic trachea. Gastric suction tube crosses the diaphragm.  Right IJ central venous catheter at the superior caval atrial junction.  No cardiomegaly.  Worsening diffuse  interstitial coarsening with small bilateral pleural effusions.  No asymmetric opacity.  No pneumothorax.  IMPRESSION: 1.  Endotracheal tube ends in the mid thoracic trachea. 2.  Worsening pulmonary edema compared to earlier today.   Original Report Authenticated By: Tiburcio Pea   Dg Chest Port 1 View  11/13/2012   *RADIOLOGY REPORT*  Clinical Data: Worsening pulmonary edema.  PORTABLE CHEST - 1 VIEW  Comparison: 11/12/2012  Findings: 0646 hours.  Endotracheal tube tip is 3.3 cm above the base of the carina. The NG tube passes into the stomach although the distal tip position is not included on the film.  Slight interval improvement and interstitial pulmonary edema.  Persistent basilar atelectasis with small bilateral pleural effusions noted right IJ central line tip overlies the mid SVC level. Telemetry leads overlie the chest.  IMPRESSION: Interval improvement and interstitial pulmonary edema.  Otherwise no substantial change.   Original Report Authenticated By: Kennith Center, M.D.   Dg Chest Port 1 View  11/12/2012   *RADIOLOGY REPORT*  Clinical Data: Evaluate lung fields, CHF  PORTABLE CHEST - 1 VIEW  Comparison: Prior chest x-ray 11/11/2012  Findings: Endotracheal tube tip is 3.9 cm above the carina.  Stable right IJ central venous catheter with the tip in the superior cavoatrial junction.  The nasogastric tube tip lies off the field of view, presumably within the stomach.  Increased pulmonary vascular  congestion and interstitial edema with worsening bibasilar opacities.  The degree of cardiomegaly is similar compared to prior.  Probable small bilateral pleural effusions.  No pneumothorax.  IMPRESSION:  Worsening pulmonary edema now with small bilateral effusions and  bibasilar atelectasis.  Stable and satisfactory support apparatus.   Original Report Authenticated By: Malachy Moan, M.D.   Dg Chest Port 1 View  11/11/2012   *RADIOLOGY REPORT*  Clinical Data: Hypoxia  PORTABLE CHEST - 1 VIEW  Comparison: Prior chest x-ray 11/10/2012  Findings: Endotracheal tube 4.9 cm above the carina.  Stable position of right IJ central venous catheter in the visualized portion of the nasogastric tube.  Increased bilateral interstitial and airspace opacities consistent with increasing pulmonary edema. Stable cardiomegaly.  Probable left pleural effusion.  No pneumothorax.  No acute osseous abnormality.  IMPRESSION:  Increasing pulmonary edema.  Stable satisfactory support apparatus.   Original Report Authenticated By: Malachy Moan, M.D.   Dg Chest Port 1 View  11/11/2012   *RADIOLOGY REPORT*  Clinical Data: evaluate lungs and line placement  PORTABLE CHEST - 1 VIEW  Comparison: 11/10/2012  Findings: Endotracheal tube tip is above the carina.  There is a right IJ catheter with tip in the cavoatrial junction.  Enteric tube tip is within the stomach.  There is mild cardiac enlargement and pulmonary vascular congestion.  No airspace consolidation.  IMPRESSION:  1.  Stable support apparatus. 2.  Cardiac enlargement and pulmonary vascular congestion.   Original Report Authenticated By: Signa Kell, M.D.   Dg Chest Port 1 View  11/10/2012   *RADIOLOGY REPORT*  Clinical Data: Coronary artery disease.  Line placement.  PORTABLE CHEST - 1 VIEW  Comparison: 11/10/2012 at to 09/23/1988  Findings: New right IJ catheter noted with tip projecting over the lower SVC.  No pneumothorax.  Endotracheal tube tip 4.6 cm above the  carina.  Nasogastric tube enters the stomach.  Moderate cardiomegaly noted with interstitial edema.  Airspace edema is improved.  Mild retrocardiac airspace opacity.  Mildly blunted left costophrenic angle.  IMPRESSION:  1.  Improved edema. 2.  Right IJ line tip:  Lower SVC.  No pneumothorax. 3.  Cardiomegaly. 4.  Left lower lobe airspace opacity potentially representing atelectasis or pneumonia.  Potential small left pleural effusion.   Original Report Authenticated By: Gaylyn Rong, M.D.   Dg Chest Port 1 View  11/10/2012   *RADIOLOGY REPORT*  Clinical Data: Check ETT  PORTABLE CHEST - 1 VIEW  Comparison: 11/09/2012  Findings: Cardiomegaly with suspected mild interstitial edema. Superimposed right upper lobe pneumonia is possible.  Patchy left lower lobe opacity, likely a combination of atelectasis and small pleural effusion.  No pneumothorax.  Endotracheal tube terminates 4 cm above the carina.  Enteric tube courses into the stomach.  IMPRESSION: Endotracheal tube terminates 4 cm above the carina.  Cardiomegaly with mild interstitial edema and small left pleural effusion.  Superimposed right upper lobe pneumonia is possible.   Original Report Authenticated By: Charline Bills, M.D.   Dg Chest Portable 1 View  11/09/2012   *RADIOLOGY REPORT*  Clinical Data: Bradycardia.  PORTABLE CHEST - 1 VIEW  Comparison: Chest 11/09/2012 and 1748 hours.  Findings: Support tubes and lines are unchanged.  Pulmonary edema and pleural effusions seen on the prior study persist but have improved.  There is cardiomegaly.  No pneumothorax.  IMPRESSION: Improved pulmonary edema.   Original Report Authenticated By: Holley Dexter, M.D.   Dg Chest Portable 1 View  11/09/2012   *RADIOLOGY REPORT*  Clinical Data: Respiratory distress.  PORTABLE CHEST - 1 VIEW  Comparison: Plain film chest 10/31/2012.  Findings: The patient has a new endotracheal tube in place with the tip in good position 2.7 cm above the carina.  There is  extensive bilateral airspace disease and small effusions, larger on the right.  Cardiomegaly is noted.  IMPRESSION:  1.  ET tube in good position. 2.  Extensive bilateral airspace disease and small effusions likely due to pulmonary edema.   Original Report Authenticated By: Holley Dexter, M.D.   Dg Abd Portable 1v  11/28/2012   *RADIOLOGY REPORT*  Clinical Data: Evaluate barium prior to potential percutaneous gastrostomy tube placement  PORTABLE ABDOMEN - 1 VIEW  Comparison: CT abdomen pelvis - 11/22/2012; chest radiograph - 11/25/2012  Findings:  Enteric contrast is seen throughout the colon.  No evidence of enteric obstruction.  No supine evidence of pneumoperitoneum.  No definite pneumatosis or portal venous gas. An enteric tube tip and side port project over the expected location of the gastric antrum.  Limited visualization of the lower thorax demonstrates enlarged cardiac silhouette.  Grossly unchanged bones.  IMPRESSION: Enteric contrast seen throughout the colon.  No evidence of obstruction.   Original Report Authenticated By: Tacey Ruiz, MD   Dg Abd Portable 1v  11/24/2012   *RADIOLOGY REPORT*  Clinical Data: Feeding tube placement.  PORTABLE ABDOMEN - 1 VIEW  Comparison: 11/21/2012  Findings: Nasogastric tube is in the distal stomach region. Nonspecific bowel gas pattern.  Limited evaluation of the lung bases.  IMPRESSION: Nasogastric tube is in the distal stomach region.   Original Report Authenticated By: Richarda Overlie, M.D.   Dg Abd Portable 1v  11/21/2012   *RADIOLOGY REPORT*  Clinical Data: Evaluate vomiting.  PORTABLE ABDOMEN - 1 VIEW  Comparison: 11/13/2012.  Findings: Enteric tube has been placed with its tip in the stomach. Bowel gas pattern is nonobstructive.  Within limits of evaluation on this supine radiograph, no visible free air.  IMPRESSION: Enteric tube tip lies along the greater curvature of the stomach.   Original Report Authenticated By: Davonna Belling, M.D.   Dg Abd Portable  1v  11/13/2012   *RADIOLOGY REPORT*  Clinical Data: OG tube placement  PORTABLE ABDOMEN - 1 VIEW  Comparison: None  Findings: Enteric tube tip is in the stomach.  The side port is below the GE junction. Bowel gas pattern appears well within normal limits.  IMPRESSION:  1.  Enteric tube placement with tip in the stomach.   Original Report Authenticated By: Signa Kell, M.D.    PHYSICAL EXAM  patient continues to be poorly responsive. Blood pressure stable. Lungs reveal scattered rhonchi. Cardiac exam Rales S1 and S2. There no clicks. There is no peripheral edema.   TELEMETRY: I have reviewed telemetry today December 06, 2012. There is atrial fibrillation. The rate is controlled.   ASSESSMENT AND PLAN:     Acute on chronic diastolic congestive heart failure      Stable at this time.    FIBRILLATION, ATRIAL      Rate is controlled. Continue the same medications today.    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  Willa Rough 12/06/2012 7:09 AM

## 2012-12-06 NOTE — Progress Notes (Deleted)
CSW contacted [facility]- spoke to [place]. She stated that they have received the referral and it is currently being reviewed.  Dois Davenport with residential hospice home will call CSW back asap. CSW will discuss with family.  Lorri Frederick. [place]  [phone]

## 2012-12-07 DIAGNOSIS — D490 Neoplasm of unspecified behavior of digestive system: Secondary | ICD-10-CM

## 2012-12-07 LAB — COMPREHENSIVE METABOLIC PANEL
Albumin: 2.8 g/dL — ABNORMAL LOW (ref 3.5–5.2)
Alkaline Phosphatase: 110 U/L (ref 39–117)
BUN: 64 mg/dL — ABNORMAL HIGH (ref 6–23)
Calcium: 13 mg/dL — ABNORMAL HIGH (ref 8.4–10.5)
Potassium: 3.6 mEq/L (ref 3.5–5.1)
Total Protein: 6.7 g/dL (ref 6.0–8.3)

## 2012-12-07 LAB — CBC
HCT: 27.9 % — ABNORMAL LOW (ref 36.0–46.0)
MCH: 26.9 pg (ref 26.0–34.0)
MCHC: 32.3 g/dL (ref 30.0–36.0)
RDW: 18.6 % — ABNORMAL HIGH (ref 11.5–15.5)

## 2012-12-07 LAB — GLUCOSE, CAPILLARY
Glucose-Capillary: 97 mg/dL (ref 70–99)
Glucose-Capillary: 125 mg/dL — ABNORMAL HIGH (ref 70–99)
Glucose-Capillary: 297 mg/dL — ABNORMAL HIGH (ref 70–99)

## 2012-12-07 LAB — PROTIME-INR
INR: 2.96 — ABNORMAL HIGH (ref 0.00–1.49)
Prothrombin Time: 29.8 seconds — ABNORMAL HIGH (ref 11.6–15.2)

## 2012-12-07 MED ORDER — PRO-STAT SUGAR FREE PO LIQD
30.0000 mL | Freq: Two times a day (BID) | ORAL | Status: DC
  Administered 2012-12-09 – 2012-12-14 (×11): 30 mL
  Filled 2012-12-07 (×14): qty 30

## 2012-12-07 MED ORDER — OSMOLITE 1.2 CAL PO LIQD
1000.0000 mL | ORAL | Status: DC
  Administered 2012-12-07 – 2012-12-10 (×4): 1000 mL
  Filled 2012-12-07 (×11): qty 1000

## 2012-12-07 NOTE — Progress Notes (Signed)
ANTICOAGULATION CONSULT NOTE - Follow Up Consult  Pharmacy Consult for Warfarin Indication: atrial fibrillation  No Known Allergies  Labs:  Recent Labs  12/05/12 0500 12/06/12 0500 12/07/12 0500  HGB 8.1* 9.3* 9.0*  HCT 25.6* 28.8* 27.9*  PLT 136* 216 175  LABPROT 27.3* 26.2* 29.8*  INR 2.64* 2.50* 2.96*  CREATININE 1.88* 1.90* 1.92*    Estimated Creatinine Clearance: 26 ml/min (by C-G formula based on Cr of 1.92).   Assessment: 72 y.o. F who continues on warfarin for anticoagulation with hx Afib. INR this morning is therapeutic(INR 2.96<<2.5<<2.64 << 2.2, goal of 2-3).   Goal of Therapy:  INR 2-3   Plan:  1. Continue warfarin 2.5 mg daily 2. Will continue to monitor INR trend, may need to decrease dose in AM  Thank you. Okey Regal, PharmD [phone]  12/07/2012 12:26 PM

## 2012-12-07 NOTE — Progress Notes (Signed)
PULMONARY  / CRITICAL CARE MEDICINE  Name: [name] MRN: [id number] DOB: [birth year]    ADMISSION DATE:  11/09/2012 CONSULTATION DATE:  11/09/2012  REFERRING MD :  Preston Fleeting PRIMARY SERVICE: PCCM  CHIEF COMPLAINT:  Shortness of breath.  BRIEF PATIENT DESCRIPTION: 72 y/o female with CHF was admitted on 7/3 from the [facility] ED with acute hypoxemic respiratory failure due to a CHF exacerbation, ?Superimposed PNA.  LINES / TUBES: 7/3 ETT >>11/13/12, 11/13/12  (failed extubation immediately, ? aspirated) >> 7/17 7/4 CVC R IJ >> 7/17 7/5 A-line >> 7/18 7/17 Trach (DF) >> 7/17 Rt PICC >>  7/22 PEG >>>  CULTURES: 7/4 U strep >> neg 7/4 legionella >> neg 7/3 mrsa PCR >> POSITIVE 7/8 mini-BAL >> yeast 7/16 sputum >> yeast  7/16 blood >> neg 7/16 urine >> yeast 50 K colonies 7/26 C. Diff>>>neg  ANTIBIOTICS: 7/3 Ceftriaxone >>7/6 7/4 levofloxacin >>7/7 7/4 Vancomycin >>7/6, restarted 7/8 >> 7/18 7/7 Unasyn >> 7/16 7/16 cefepime >> 7/24  SIGNIFICANT EVENTS: 7/3 admission 7/07 reintubated immediately after extubation.  ? Aspiration. ABX restarted.  on Neo-Synephrine.  7/09 Able to wean fio2 and neo. Lopressor required for rate control of afib. 7/10 Off neo, but now tachycardic.  Failed SBT this AM, but RT felt it was due to sedation.  Sedation weaned 7/14 Hypotensive, vomiting, amiodarone turned off for low heart rate 7/16 Change to pressure control 7/17 Start pressors, PRBC transfusion, trach 7/18 Off pressors, PRBC transfusion 7/28- 10-12 hrs trach collar trial 7/29- recs from surgery for pancreatic lesions obtained  STUDIES: 7/16 CT chest >> emphysema, b/l ASD, small effusions 7/16 CT abd/pelvis >> 2.6 x 2 cm nodule in head of pancreas, bone changes suggestive of Paget's disease  SUBJECTIVE: continues trach trach collar  VITAL SIGNS: Temp:  [97.9 F (36.6 C)-98.4 F (36.9 C)] 98.4 F (36.9 C) (07/31 0736) Pulse Rate:  [56-87] 82 (07/31 0933) Resp:  [16-30] 24 (07/31  0933) BP: (110-149)/(72-92) 126/92 mmHg (07/31 0933) SpO2:  [95 %-100 %] 97 % (07/31 0933) FiO2 (%):  [28 %] 28 % (07/31 0933)  HEMODYNAMICS: CVP:  [8 mmHg-12 mmHg] 12 mmHg  VENTILATOR SETTINGS: Vent Mode:  [-]  FiO2 (%):  [28 %] 28 %  INTAKE / OUTPUT: Intake/Output     07/30 0701 - 07/31 0700 07/31 0701 - 08/01 0700   I.V. (mL/kg) 710 (8.8) 60 (0.7)   NG/GT 1359 40   Total Intake(mL/kg) 2069 (25.7) 100 (1.2)   Urine (mL/kg/hr) 1385 (0.7) 300 (1.2)   Total Output 1385 300   Net +684 -200        Urine Occurrence 3 x    Stool Occurrence 3 x     PHYSICAL EXAMINATION: Gen: Laying in bed, in NAD on trach collar HEENT: PEG in place, trach site clean PULM: course improved after neg  balance CV: irregular, no m/r/g AB: soft, non tender, BS+ Ext: no edema Neuro: RASS 0, moves extremities, follows simple commands intermittent  LABS: CBC Recent Labs     12/05/12  0500  12/06/12  0500  12/07/12  0500  WBC  12.7*  17.0*  17.6*  HGB  8.1*  9.3*  9.0*  HCT  25.6*  28.8*  27.9*  PLT  136*  216  175   Coag's Recent Labs     12/05/12  0500  12/06/12  0500  12/07/12  0500  INR  2.64*  2.50*  2.96*   BMET Recent Labs     12/05/12  0500  12/06/12  0500  12/07/12  0500  NA  151*  151*  152*  K  3.0*  3.0*  3.6  CL  114*  116*  120*  CO2  25  24  22   BUN  64*  66*  64*  CREATININE  1.88*  1.90*  1.92*  GLUCOSE  76  69*  114*   Electrolytes Recent Labs     12/05/12  0500  12/06/12  0500  12/07/12  0500  CALCIUM  11.1*  12.4*  13.0*  MG  1.7   --    --   PHOS  4.9*   --    --    Glucose Recent Labs     12/06/12  0717  12/06/12  1235  12/06/12  1545  12/06/12  2008  12/07/12  0448  12/07/12  0739  GLUCAP  81  181*  182*  241*  97  125*    CXR: 7/30 - no significant change - continues to to have mild interstitial densities bil  ASSESSMENT / PLAN:  PULMONARY A:  Acute respiratory failure 2nd to pulmonary infiltrates, resolving. effusions P:   -  continue wean on TC 28% - PRN BD's. - control rate to avoid diastolic CHF -even balance goal  CARDIOVASCULAR A:  HOCM with acute on chronic diastolic heart failure. A fib with RVR >> amiodarone d/c due to bradycardia, hypotension. Hx of CAD, HTN, hyperlipidemia. Shock >> resolved P:  - Anticoagulation managed by Cards. Continue on Warfarin and heparin dose - unless any interventions for pancrease required - Continue Cardizem PO for rate control 30 q6 hours. -if VT re occur, consider beta blocker escalation, none overnight  RENAL A: Acute kidney injury >> renal fx stable. Hypernatremia  Hypokalemia - resolved Hypercalcemia  Hyperphosphatemia Mag WNL P:   - KVO IV fluids, even balance goals\ - F/U electrolytes in AM -maintain free water , na 152 -continue d5w slight at 30   GASTROINTESTINAL A:   Protein malnutrition. Vomiting - resolved Mass head of pancreas-- Will need further assessment of pancreatic lesion when more stable, likely in LTAC or outpatient, Ca 19-9 elevated (73.1)- concern cancer P:   - PEG tube  - Pepcid for SUP - gen sx consulted, will order MRCP when more confident for safety on trach collar flat in MRI machine -will d/w GI for [place] endoscopic plans in future, LFT noted wnl except albumin low  GYN A:  Uterine prolapse. P: - GYN consult called on Friday, prolapse per GYN.  HEMATOLOGIC A:  Anemia of chronic disease and critical illness >> PRBC transfusion 7/17, 7/18. Thrombocytopenia  hemoconcetration 7/28 Leukocytosis - WBC 17.6 on 7/31, no fever, most likely due to hemoconcentration  P:  - Monitor CBC in AM - Continue coumadin - follow fever curve  INFECTIOUS A: Initial concern for aspiration pneumonia >> has persistent pulmonary infiltrates, and increasing WBC. P:   -no abx, follow fever curve  ENDOCRINE A:  DM2 P:   - SSI  NEUROLOGIC A: Acute encephalopathy 2nd to hypoxia/hypercapnia. Muscular deconditioning. P:   - PT/OT,  progress, especialy as neuro better and trach collar successful -Consider downsize in 24 hrs trach - in am planned to 4 cuffless likely  Today's summary: consider change to cuffless in am, mrcp when more stable, will call GI for planned endo [place]  Arezu Shekari PA-S  I have fully examined this patient and agree with above findings.    And edited in full  Lavon Paganini. Titus Mould, MD, [place] Pgr: [facility]

## 2012-12-07 NOTE — Progress Notes (Signed)
NUTRITION FOLLOW UP  Intervention:    Change EN regimen to Osmolite 1.2 formula at goal rate of 55 ml/hr with Prostat liquid protein 30 ml twice daily via tube to provide 1784 total kcals, 103 gm protein, 1082 ml of free water RD to follow for nutrition care plan  Nutrition Dx:   Inadequate oral intake related to inability to eat as evidenced by NPO status, ongoing  New Goal:   EN to meet > 90% of estimated nutrition needs, currently unmet  Monitor:   EN regimen & tolerance, respiratory status, weight, labs, I/O's  Assessment:   Patient with hx of CHF admitted from [facility] ED with acute hypoxemic respiratory failure due to CHF exacerbation.  Patient currently off ventilatory support.  Tolerating TC.  Following simple commands.  Will likely need LTACH.  Osmolite 1.2 formula infusing at 20 ml/hr via PEG tube with Prostat liquid protein 30 ml 6 times daily providing 1176 kcal, 116 gm protein, 393 ml of free water.  Free water flushes at 300 ml every 6 hours.  RD with EN management privileges, initially consulted 7/4.  Height: Ht Readings from Last 1 Encounters:  11/09/12 5\' 2"  (1.575 m)    Weight Status:   Wt Readings from Last 1 Encounters:  12/06/12 177 lb 4 oz (80.4 kg)    Re-estimated needs:  Kcal: 1650-1850 Protein: 95-105 gm Fluid: 1.6-1.8 L  Skin: Intact  Diet Order: NPO   Intake/Output Summary (Last 24 hours) at 12/07/12 1432 Last data filed at 12/07/12 1240  Gross per 24 hour  Intake   1889 ml  Output   1675 ml  Net    214 ml    Labs:   Recent Labs Lab 12/01/12 0520 12/02/12 0400  12/05/12 0500 12/06/12 0500 12/07/12 0500  NA 150* 149*  < > 151* 151* 152*  K 3.9 4.6  < > 3.0* 3.0* 3.6  CL 116* 114*  < > 114* 116* 120*  CO2 27 27  < > 25 24 22   BUN 73* 76*  < > 64* 66* 64*  CREATININE 2.44* 2.64*  < > 1.88* 1.90* 1.92*  CALCIUM 12.7* 12.7*  < > 11.1* 12.4* 13.0*  MG 2.1 2.1  --  1.7  --   --   PHOS 4.5 4.1  --  4.9*  --   --   GLUCOSE 115* 162*   < > 76 69* 114*  < > = values in this interval not displayed.  CBG (last 3)   Recent Labs  12/07/12 0448 12/07/12 0739 12/07/12 1212  GLUCAP 97 125* 297*    Scheduled Meds: . antiseptic oral rinse  15 mL Mouth Rinse QID  . chlorhexidine  15 mL Mouth/Throat BID  . digoxin  0.125 mg Per Tube Q M,W,F,S,S -1800  . diltiazem  60 mg Per Tube Q6H  . famotidine  20 mg Per Tube Daily  . feeding supplement  60 mL Oral TID WC  . free water  300 mL Per Tube Q6H  . hydrocortisone sod succinate (SOLU-CORTEF) inj  25 mg Intravenous Q12H  . insulin aspart  0-20 Units Subcutaneous Q4H  . potassium chloride  40 mEq Per Tube BID  . sodium chloride  10-40 mL Intracatheter Q12H  . warfarin  2.5 mg Oral q1800  . Warfarin - Pharmacist Dosing Inpatient   Does not apply q1800    Continuous Infusions: . sodium chloride Stopped (12/05/12 1201)  . dextrose 30 mL/hr at 12/07/12 0112  . feeding supplement (  OSMOLITE 1.2 CAL) 1,000 mL (12/07/12 0106)    Maureen Chatters, RD, LDN Pager #: [phone] After-Hours Pager #: [phone]

## 2012-12-07 NOTE — Progress Notes (Signed)
Patient ID: [name], female   DOB: Jan 10, 1941, 72 y.o.   MRN: [id number]   SUBJECTIVE: Overall cardiac status is unchanged. Blood pressure is stable. Atrial fibrillation rate is controlled.   Filed Vitals:   12/07/12 0400 12/07/12 0445 12/07/12 0520 12/07/12 0736  BP: 128/74   117/72  Pulse: 56  79 80  Temp:  98 F (36.7 C)  98.4 F (36.9 C)  TempSrc:  Oral  Axillary  Resp: 24  30 22   Height:      Weight:      SpO2: 96%  98% 96%    Intake/Output Summary (Last 24 hours) at 12/07/12 0837 Last data filed at 12/07/12 0736  Gross per 24 hour  Intake   2029 ml  Output   1685 ml  Net    344 ml    LABS: Basic Metabolic Panel:  Recent Labs  40/98/11 0500 12/06/12 0500 12/07/12 0500  NA 151* 151* 152*  K 3.0* 3.0* 3.6  CL 114* 116* 120*  CO2 25 24 22   GLUCOSE 76 69* 114*  BUN 64* 66* 64*  CREATININE 1.88* 1.90* 1.92*  CALCIUM 11.1* 12.4* 13.0*  MG 1.7  --   --   PHOS 4.9*  --   --    Liver Function Tests:  Recent Labs  12/07/12 0500  AST 22  ALT 24  ALKPHOS 110  BILITOT 0.7  PROT 6.7  ALBUMIN 2.8*   No results found for this basename: LIPASE, AMYLASE,  in the last 72 hours CBC:  Recent Labs  12/06/12 0500 12/07/12 0500  WBC 17.0* 17.6*  HGB 9.3* 9.0*  HCT 28.8* 27.9*  MCV 83.2 83.3  PLT 216 175   Cardiac Enzymes: No results found for this basename: CKTOTAL, CKMB, CKMBINDEX, TROPONINI,  in the last 72 hours BNP: No components found with this basename: POCBNP,  D-Dimer: No results found for this basename: DDIMER,  in the last 72 hours Hemoglobin A1C: No results found for this basename: HGBA1C,  in the last 72 hours Fasting Lipid Panel: No results found for this basename: CHOL, HDL, LDLCALC, TRIG, CHOLHDL, LDLDIRECT,  in the last 72 hours Thyroid Function Tests: No results found for this basename: TSH, T4TOTAL, FREET3, T3FREE, THYROIDAB,  in the last 72 hours  RADIOLOGY: Ct Abdomen Pelvis Wo Contrast  11/22/2012   *RADIOLOGY REPORT*   Clinical Data:  Pulmonary infiltrates with abdominal pain.  CT CHEST, ABDOMEN AND PELVIS WITHOUT CONTRAST  Technique:  Multidetector CT imaging of the chest, abdomen and pelvis was performed following the standard protocol without IV contrast.  Comparison:   None.  CT CHEST  Findings:  Tracheostomy tube tip is in the trachea above the level of the bifurcation.  NG tube tip is in the distal stomach.  No axillary lymphadenopathy.  Scattered small lymph nodes are seen in the mediastinum.  Hilar lymphadenopathy cannot be excluded on this study without intravenous contrast material.  The heart is enlarged. Coronary artery calcification is noted.  No pericardial effusion.  Small bilateral pleural effusions are evident.  Lung windows show advanced underlying emphysema.  There is a peripheral interstitial and airspace disease with bilateral dependent collapse / consolidation.  The dense airspace consolidation mainly involves the lower lobes.  Bone windows reveal no worrisome lytic or sclerotic osseous lesions.  IMPRESSION: Emphysema with peripheral interstitial and airspace disease associated with bilateral lower lobe collapse / consolidation. Underlying pulmonary edema not excluded.  Small bilateral pleural effusions.  CT ABDOMEN AND PELVIS  Findings:  No focal abnormalities seen in the liver or spleen. Duodenum and adrenal glands are unremarkable.  The gallbladder lumen is filled with high attenuation material which may be from vicarious excretion of previous contrast material.  Right kidney is unremarkable.  There is mild fullness of the left intrarenal collecting system.  2.6 x 2.0 cm apparent soft tissue nodule is seen along the inferior head of pancreas.  This is difficult to assess given motion artifact and lack of intravenous contrast material.  No abdominal aortic aneurysm.  No free fluid in the abdomen.  No evidence for small bowel obstruction.  Imaging through the pelvis shows no free intraperitoneal fluid. Foley  catheter decompresses the urinary bladder.  Uterus is surgically absent.  There is no adnexal mass.  There is some diverticular change in the left colon without diverticulitis. Terminal ileum is normal.  The appendix is normal.  Abnormal soft tissue attenuation is seen at the umbilicus.  There is some mild body wall edema in the region of the pelvis.  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  No worrisome lytic or sclerotic osseous abnormality.  IMPRESSION: 2.6 cm soft tissue nodule along the inferior head of pancreas. This is not completely characterized given motion artifact and lack of intravenous contrast material. Pancreatic neoplasm not excluded.  Soft tissue attenuation of the umbilicus.  This could be related to infection or scar.  This should be amendable to clinical inspection.  Mild fullness of the left intrarenal collecting system is of indeterminate etiology/significance.  Underlying component of UPJ obstruction would be a consideration.  Mild body wall edema.   Original Report Authenticated By: Kennith Center, M.D.   Ct Chest Wo Contrast  11/22/2012   *RADIOLOGY REPORT*  Clinical Data:  Pulmonary infiltrates with abdominal pain.  CT CHEST, ABDOMEN AND PELVIS WITHOUT CONTRAST  Technique:  Multidetector CT imaging of the chest, abdomen and pelvis was performed following the standard protocol without IV contrast.  Comparison:   None.  CT CHEST  Findings:  Tracheostomy tube tip is in the trachea above the level of the bifurcation.  NG tube tip is in the distal stomach.  No axillary lymphadenopathy.  Scattered small lymph nodes are seen in the mediastinum.  Hilar lymphadenopathy cannot be excluded on this study without intravenous contrast material.  The heart is enlarged. Coronary artery calcification is noted.  No pericardial effusion.  Small bilateral pleural effusions are evident.  Lung windows show advanced underlying emphysema.  There is a peripheral  interstitial and airspace disease with bilateral dependent collapse / consolidation.  The dense airspace consolidation mainly involves the lower lobes.  Bone windows reveal no worrisome lytic or sclerotic osseous lesions.  IMPRESSION: Emphysema with peripheral interstitial and airspace disease associated with bilateral lower lobe collapse / consolidation. Underlying pulmonary edema not excluded.  Small bilateral pleural effusions.  CT ABDOMEN AND PELVIS  Findings:  No focal abnormalities seen in the liver or spleen. Duodenum and adrenal glands are unremarkable.  The gallbladder lumen is filled with high attenuation material which may be from vicarious excretion of previous contrast material.  Right kidney is unremarkable.  There is mild fullness of the left intrarenal collecting system.  2.6 x 2.0 cm apparent soft tissue nodule is seen along the inferior head of pancreas.  This is difficult to assess given motion artifact and lack of intravenous contrast material.  No abdominal aortic aneurysm.  No free fluid in the abdomen.  No evidence for small bowel  obstruction.  Imaging through the pelvis shows no free intraperitoneal fluid. Foley catheter decompresses the urinary bladder.  Uterus is surgically absent.  There is no adnexal mass.  There is some diverticular change in the left colon without diverticulitis. Terminal ileum is normal.  The appendix is normal.  Abnormal soft tissue attenuation is seen at the umbilicus.  There is some mild body wall edema in the region of the pelvis.  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  No worrisome lytic or sclerotic osseous abnormality.  IMPRESSION: 2.6 cm soft tissue nodule along the inferior head of pancreas. This is not completely characterized given motion artifact and lack of intravenous contrast material. Pancreatic neoplasm not excluded.  Soft tissue attenuation of the umbilicus.  This could be related to infection or scar.   This should be amendable to clinical inspection.  Mild fullness of the left intrarenal collecting system is of indeterminate etiology/significance.  Underlying component of UPJ obstruction would be a consideration.  Mild body wall edema.   Original Report Authenticated By: Kennith Center, M.D.   Ir Gastrostomy Tube Mod Sed  11/28/2012   *RADIOLOGY REPORT*  Indication:  Malnutrition  PULL TROUGH GASTOSTOMY TUBE PLACEMENT  Comparison: Abdominal radiograph - earlier same day; CT abdomen pelvis - 11/22/2012  Medications:  Versed 2 mg IV; Fentanyl 25 mcg IV; the patient is currently admitted to the hospital receiving intravenous antibiotic; Antibiotics were administered within 1 hour of the procedure.  Contrast volume:  20 mL Omnipaque-300 administered into the gastric lumen  Sedation time: 8 minutes  Fluoroscopy time: 1 minute, 48 seconds  Complications: None immediate  PROCEDURE/FINDINGS:  Informed written consent was obtained from the patient's family following explanation of the procedure, risks, benefits and alternatives.  A time out was performed prior to the initiation of the procedure.  Maximal barrier sterile technique utilized including caps, mask, sterile gowns, sterile gloves, large sterile drape, hand hygiene and Betadine prep.  The left upper quadrant was sterilely prepped and draped.  An oral gastric catheter was inserted into the stomach under fluoroscopy. The existing nasogastric feeding tube was removed.  The left costal margin and barium opacified transverse colon were identified and avoided.  Air was injected into the stomach for insufflation and visualization under fluoroscopy.  Under sterile conditions a 17 gauge trocar needle was utilized to access the stomach percutaneously beneath the left subcostal margin after the overlying soft tissues were anesthetized with 1% Lidocaine with epinephrine.  Needle position was confirmed within the stomach with aspiration of air and injection of small amount of  contrast.   A single T tack was deployed for gastropexy.  Over an Amplatz guide wire, a 9-French sheath was inserted into the stomach.  A snare device was utilized to capture the oral gastric catheter.  The snare device was pulled retrograde from the stomach up the esophagus and out the oropharynx.  The 20-French pull-through gastrostomy was connected to the snare device and pulled antegrade through the oropharynx down the esophagus into the stomach and then through the percutaneous tract external to the patient.  The gastrostomy was assembled externally.  Contrast injection confirms position in the stomach.   Several spot radiographic images were obtained in various obliquities for documentation.  The patient tolerated procedure well without immediate post procedural complication.  IMPRESSION:  Successful fluoroscopic insertion of a 20-French "pull-through" gastrostomy.   Original Report Authenticated By: Tacey Ruiz, MD   Dg Chest [facility]  12/06/2012   *RADIOLOGY REPORT*  Clinical Data: Tracheostomy tube placement.  PORTABLE CHEST - 1 VIEW  Comparison: December 05, 2012.  Findings: Tracheostomy tube is in grossly good position.  Right- sided PICC line is unchanged in position.  Stable cardiomegaly is noted.  Mild interstitial densities are noted throughout both lungs which are not changed compared to prior exam.  No pneumothorax or pleural effusion is noted.  IMPRESSION: No significant change compared to prior exam.   Original Report Authenticated By: Lupita Raider.,  M.D.   Dg Chest Port 1 View  12/05/2012   *RADIOLOGY REPORT*  Clinical Data: Follow up edema versus pneumonia and bilateral effusions.  PORTABLE CHEST - 1 VIEW  Comparison: Portable chest x-rays 12/05/2012 dating back to 11/24/2012.  Findings: Tracheostomy tube tip in satisfactory position below the thoracic inlet.  Right arm PICC tip projects over the mid SVC. Cardiac silhouette enlarged but stable.  Improved aeration in both lung bases,  with only mild atelectasis persisting.  Near complete resolution of bilateral pleural effusions.  No new pulmonary parenchymal abnormalities.  IMPRESSION: Support apparatus satisfactory.  Improved aeration in the lung bases with only mild atelectasis persisting.  Essential resolution of bilateral pleural effusions.  No new abnormalities.   Original Report Authenticated By: Hulan Saas, M.D.   Dg Chest Port 1 View  12/03/2012   *RADIOLOGY REPORT*  Clinical Data: Evaluate for possible aspiration pneumonia.  PORTABLE CHEST - 1 VIEW  Comparison: Chest x-ray 11/30/2012.  Findings: A tracheostomy tube is in place with tip 4.3 cm above the carina. There is a right upper extremity PICC with tip terminating in the superior cavoatrial junction. Lung volumes are low. Widespread multifocal interstitial and airspace disease throughout the lungs bilaterally, asymmetrically distributed with relative sparing of the left upper lobe.  Moderate bilateral pleural effusions.  Mild cardiomegaly. The patient is rotated to the left on today's exam, resulting in distortion of the mediastinal contours and reduced diagnostic sensitivity and specificity for mediastinal pathology.  IMPRESSION: 1.  Support apparatus, as above. 2.  Patchy multifocal interstitial and airspace disease asymmetrically distributed throughout the lungs bilaterally, concerning for multilobar pneumonia. 3.  Moderate bilateral pleural effusions. 4.  Mild cardiomegaly.   Original Report Authenticated By: Trudie Reed, M.D.   Dg Chest Port 1 View  11/30/2012   *RADIOLOGY REPORT*  Clinical Data: Evaluate tracheostomy  PORTABLE CHEST - 1 VIEW  Comparison: Portable chest x-ray of 11/25/2012  Findings: The tracheostomy is unchanged in position with the tip overlying the tracheal air shadow well above the carina.  Right PICC line tip is noted to the lower SVC.  Cardiomegaly and pulmonary vascular congestion with effusions remain consistent with mild congestive heart  failure.  IMPRESSION:  1.  No change in mild congestive heart failure with effusions. 2.  No change in position of tracheostomy and right central venous line.   Original Report Authenticated By: Dwyane Dee, M.D.   Dg Chest Port 1 View  11/25/2012   *RADIOLOGY REPORT*  Clinical Data: Follow up pneumonia.  PORTABLE CHEST - 1 VIEW  Comparison: Chest radiograph performed 11/24/2012  Findings: The lungs are relatively well expanded.  Diffuse bilateral airspace opacification is again noted, worse on the right.  This may reflect multifocal pneumonia or pulmonary edema. Small bilateral pleural effusions are noted.  No pneumothorax is seen.  The cardiomediastinal silhouette is mildly enlarged.  The patient's tracheostomy tube is seen ending 4-5 cm above the carina.  Enteric tube is noted extending below the diaphragm.  No acute osseous abnormalities are seen.  IMPRESSION:  1.  Diffuse bilateral airspace opacification again noted, worse on the right.  This is perhaps slightly worsened from the prior study, and may reflect multifocal pneumonia or pulmonary edema.  Small bilateral pleural effusions noted. 2.  Mild cardiomegaly.   Original Report Authenticated By: Tonia Ghent, M.D.   Dg Chest Port 1 View  11/24/2012   *RADIOLOGY REPORT*  Clinical Data: Follow up pneumonia.  Intubated patient.  Tube placement.  PORTABLE CHEST - 1 VIEW  Comparison: 11/23/2012.  Findings: The cardiopericardial silhouette is enlarged.  There is diffuse right greater than left bilateral airspace disease.  This has progressed compared yesterday's examination.  The endotracheal tube remains present with the tip 6.3 cm from the carina.  Right upper extremity PICC is present with the tip at the level of the carina in the mid SVC.  Enteric tube is present.  There is probably a small right pleural effusion present.  IMPRESSION:  1.  Stable support apparatus. 2.  Worsening bilateral right greater than left airspace disease. This may represent multi  focal pneumonia and / or edema associated with CHF in this patient with cardiomegaly.   Original Report Authenticated By: Andreas Newport, M.D.   Chest Portable 1 View To Assess Tube Placement And Rule-out Pneumothorax  11/23/2012   *RADIOLOGY REPORT*  Clinical Data: Evaluate tube placement  PORTABLE CHEST - 1 VIEW  Comparison: 11/23/2012  Findings: The tracheostomy tube is midline and the tip is above the carina.  There is a nasogastric tube with tip in the stomach.  Right arm PICC line tip is in the cavoatrial junction.  The right IJ catheter tip is in the projection of the SVC.  Heart size is moderately enlarged.  There are bilateral pleural effusions and interstitial edema consistent with CHF.  IMPRESSION:  1. Satisfactory position of the support apparatus including the tracheostomy tube 2.  Moderate CHF.   Original Report Authenticated By: Signa Kell, M.D.   Dg Chest Port 1 View  11/23/2012   *RADIOLOGY REPORT*  Clinical Data: Pulmonary infiltrates, follow-up  PORTABLE CHEST - 1 VIEW  Comparison: CT chest of 11/22/2012 and chest x-ray of the same date  Findings: The tip of the endotracheal tube is approximately 5.8 cm above the carina.  The lungs appear slightly better aerated although diffuse airspace disease remains.  Haziness at the lung bases is most consistent with the presence of bilateral pleural effusions, and cardiomegaly is stable.  The right central venous line is unchanged in position.  IMPRESSION: Slightly improved aeration.  Persistent airspace disease with probable effusions.   Original Report Authenticated By: Dwyane Dee, M.D.   Dg Chest Port 1 View  11/22/2012   *RADIOLOGY REPORT*  Clinical Data: CHF, ventilatory support  PORTABLE CHEST - 1 VIEW  Comparison: 11/21/2012  Findings: Stable support apparatus.  Cardiomegaly with a similar pattern of diffuse edema.  No enlarging effusion or pneumothorax. No significant interval change.  IMPRESSION: Stable CHF pattern.   Original Report  Authenticated By: Judie Petit. Miles Costain, M.D.   Dg Chest Port 1 View  11/21/2012   *RADIOLOGY REPORT*  Clinical Data: Respiratory failure; diminished lung sounds.  PORTABLE CHEST - 1 VIEW  Comparison: Chest radiograph performed 11/20/2012  Findings: The patient's endotracheal tube is seen ending 4 cm above the carina.  The lungs are relatively well expanded.  Vascular congestion is noted, with diffusely increased interstitial markings, concerning for persistent pulmonary edema.  Small bilateral pleural effusions are noted.  Findings are redistributed from the prior study, but otherwise grossly unchanged.  No pneumothorax is seen.  The cardiomediastinal silhouette is mildly enlarged.  No acute osseous abnormalities are seen.  The patient's enteric tube is noted extending below the diaphragm.  The right IJ line is noted ending about the distal SVC.  IMPRESSION: Vascular congestion and mild cardiomegaly, with diffusely increased interstitial markings, concerning for persistent pulmonary edema. Small bilateral pleural effusions noted.  There has been mild interval redistribution of airspace opacities, but findings are otherwise grossly unchanged.   Original Report Authenticated By: Tonia Ghent, M.D.   Dg Chest Port 1 View  11/20/2012   *RADIOLOGY REPORT*  Clinical Data: Endotracheal tube placement  PORTABLE CHEST - 1 VIEW  Comparison: Portable exam 1019 hours compared to 11/18/2012  Findings: Rotated to the right. Tip of endotracheal tube projects 5.5 cm above carina. Nasogastric tube extends to at least the inferior mediastinum, unable to see beyond. Enlargement of cardiac silhouette. Tortuous aorta. Scattered infiltrates question edema. Minimal fluid or atelectasis at minor fissure. No gross pneumothorax.  IMPRESSION: Tip of endotracheal tube projects 5.5 cm above carina. Enlargement of cardiac silhouette with scattered infiltrates question edema.   Original Report Authenticated By: Ulyses Southward, M.D.   Dg Chest Port 1  View  11/18/2012   *RADIOLOGY REPORT*  Clinical Data: Evaluate lungs.  PORTABLE CHEST - 1 VIEW  Comparison: 11/17/2012  Findings: Endotracheal tube, enteric tube, and right central venous catheter appear unchanged in position.  Shallow inspiration. Cardiac enlargement with pulmonary vascular congestion.  No edema or consolidation.  No blunting of costophrenic angles.  No pneumothorax.  Tortuous aorta.  IMPRESSION: Appliances remain in satisfactory apparent location.  Cardiac enlargement with mild pulmonary vascular congestion.   Original Report Authenticated By: Burman Nieves, M.D.   Dg Chest Port 1 View  11/17/2012   *RADIOLOGY REPORT*  Clinical Data: Endotracheal tube placement  PORTABLE CHEST - 1 VIEW  Comparison: November 16, 2012.  Findings: Endotracheal tube is in grossly good position with distal tip approximately 4 cm above the carina.  Nasogastric tube passes through esophagus into stomach.  Right internal jugular catheter line is unchanged in position.  Stable cardiomediastinal silhouette.  No pneumothorax or pleural effusion is noted.  No acute pulmonary disease is noted.  IMPRESSION: Endotracheal tube in grossly good position.  No significant change compared to prior exam.   Original Report Authenticated By: Lupita Raider.,  M.D.   Dg Chest Port 1 View  11/16/2012   *RADIOLOGY REPORT*  Clinical Data: Evaluation of endotracheal tube.  History of coronary artery disease, atrial fibrillation, hypertension, and tobacco use.  PORTABLE CHEST - 1 VIEW  Comparison: 11/14/2012.  Findings: Tip of endotracheal tube terminates 3 cm above carina. Enteric tube is in place.  Tip is not included on the image.  Tip of right internal jugular venous catheter terminates in the lower portion of the superior vena cava near the cavoatrial junction.  No pneumothorax is evident.  There is cardiac silhouette enlargement which appears larger than on previous study.  Aortic ectasia is present.  There is slight upper lobe  vascular prominence congestion pattern accentuated by semi-erect positioning.  There is decrease in the infiltrative densities within the right lung with decrease in the edema pattern.  There is elevation of the left hemidiaphragm with minimal atelectasis and infiltrative density in left base. There is decrease in the right pleural effusion with only slight residual blunting of the right costophrenic angle.  There is slight indistinctness  of left costophrenic angle which may reflect small amount of left pleural fluid.  Osteopenic appearance of bones.  IMPRESSION: Tip of endotracheal tube terminates 3 cm above carina.  Enteric tube and right internal jugular venous catheter in place with details given above.  Cardiac silhouette enlargement which appears larger than on previous study.  Mild upper lobe vessel prominence accentuated by semi-erect positioning.  Decrease in the infiltrative densities within the right lung with decrease in the edema pattern.  There is elevation of the left hemidiaphragm with minimal atelectasis and infiltrative density in left base.  There is decrease in the right pleural effusion with only slight residual blunting of the right costophrenic angle.  There is slight indistinctness of left costophrenic angle which may reflect small amount of left pleural fluid.   Original Report Authenticated By: Onalee Hua Call   Dg Chest Port 1 View  11/14/2012   *RADIOLOGY REPORT*  Clinical Data: Shortness of breath.  PORTABLE CHEST - 1 VIEW  Comparison: 11/13/2012  Findings: Endotracheal tube tip measures about 3.6 cm above the carina.  Enteric tube tip is out of the field of view but below the left hemidiaphragm.  Right central venous catheter tip in the cavoatrial junction.  Cardiac enlargement with mild pulmonary vascular congestion and perihilar edema.  Edema pattern appears slightly improved.  Probable right pleural effusion.  IMPRESSION: Appliances appear in satisfactory position.  Cardiac enlargement  with pulmonary vascular congestion and improving edema.  Right pleural effusion.   Original Report Authenticated By: Burman Nieves, M.D.   Dg Chest Port 1 View  11/13/2012   *RADIOLOGY REPORT*  Clinical Data: ET tube placement.  PORTABLE CHEST - 1 VIEW  Comparison: Chest x-ray from earlier the same date.  Findings: Endotracheal tube terminates in the mid thoracic trachea. Gastric suction tube crosses the diaphragm.  Right IJ central venous catheter at the superior caval atrial junction.  No cardiomegaly.  Worsening diffuse  interstitial coarsening with small bilateral pleural effusions.  No asymmetric opacity.  No pneumothorax.  IMPRESSION: 1.  Endotracheal tube ends in the mid thoracic trachea. 2.  Worsening pulmonary edema compared to earlier today.   Original Report Authenticated By: Tiburcio Pea   Dg Chest Port 1 View  11/13/2012   *RADIOLOGY REPORT*  Clinical Data: Worsening pulmonary edema.  PORTABLE CHEST - 1 VIEW  Comparison: 11/12/2012  Findings: 0646 hours.  Endotracheal tube tip is 3.3 cm above the base of the carina. The NG tube passes into the stomach although the distal tip position is not included on the film.  Slight interval improvement and interstitial pulmonary edema.  Persistent basilar atelectasis with small bilateral pleural effusions noted right IJ central line tip overlies the mid SVC level. Telemetry leads overlie the chest.  IMPRESSION: Interval improvement and interstitial pulmonary edema.  Otherwise no substantial change.   Original Report Authenticated By: Kennith Center, M.D.   Dg Chest Port 1 View  11/12/2012   *RADIOLOGY REPORT*  Clinical Data: Evaluate lung fields, CHF  PORTABLE CHEST - 1 VIEW  Comparison: Prior chest x-ray 11/11/2012  Findings: Endotracheal tube tip is 3.9 cm above the carina.  Stable right IJ central venous catheter with the tip in the superior cavoatrial junction.  The nasogastric tube tip lies off the field of view, presumably within the stomach.   Increased pulmonary vascular congestion and interstitial edema with worsening bibasilar opacities.  The degree of cardiomegaly is similar compared to prior.  Probable small bilateral pleural effusions.  No pneumothorax.  IMPRESSION:  Worsening pulmonary edema now with small bilateral effusions and bibasilar atelectasis.  Stable and satisfactory support apparatus.   Original Report Authenticated By: Malachy Moan, M.D.   Dg Chest Port 1 View  11/11/2012   *RADIOLOGY REPORT*  Clinical Data: Hypoxia  PORTABLE CHEST - 1 VIEW  Comparison: Prior chest x-ray 11/10/2012  Findings: Endotracheal tube 4.9 cm above the carina.  Stable position of right IJ central venous catheter in the visualized portion of the nasogastric tube.  Increased bilateral interstitial and airspace opacities consistent with increasing pulmonary edema. Stable cardiomegaly.  Probable left pleural effusion.  No pneumothorax.  No acute osseous abnormality.  IMPRESSION:  Increasing pulmonary edema.  Stable satisfactory support apparatus.   Original Report Authenticated By: Malachy Moan, M.D.   Dg Chest Port 1 View  11/11/2012   *RADIOLOGY REPORT*  Clinical Data: evaluate lungs and line placement  PORTABLE CHEST - 1 VIEW  Comparison: 11/10/2012  Findings: Endotracheal tube tip is above the carina.  There is a right IJ catheter with tip in the cavoatrial junction.  Enteric tube tip is within the stomach.  There is mild cardiac enlargement and pulmonary vascular congestion.  No airspace consolidation.  IMPRESSION:  1.  Stable support apparatus. 2.  Cardiac enlargement and pulmonary vascular congestion.   Original Report Authenticated By: Signa Kell, M.D.   Dg Chest Port 1 View  11/10/2012   *RADIOLOGY REPORT*  Clinical Data: Coronary artery disease.  Line placement.  PORTABLE CHEST - 1 VIEW  Comparison: 11/10/2012 at to 09/23/1988  Findings: New right IJ catheter noted with tip projecting over the lower SVC.  No pneumothorax.  Endotracheal  tube tip 4.6 cm above the carina.  Nasogastric tube enters the stomach.  Moderate cardiomegaly noted with interstitial edema.  Airspace edema is improved.  Mild retrocardiac airspace opacity.  Mildly blunted left costophrenic angle.  IMPRESSION:  1.  Improved edema. 2.  Right IJ line tip:  Lower SVC.  No pneumothorax. 3.  Cardiomegaly. 4.  Left lower lobe airspace opacity potentially representing atelectasis or pneumonia.  Potential small left pleural effusion.   Original Report Authenticated By: Gaylyn Rong, M.D.   Dg Chest Port 1 View  11/10/2012   *RADIOLOGY REPORT*  Clinical Data: Check ETT  PORTABLE CHEST - 1 VIEW  Comparison: 11/09/2012  Findings: Cardiomegaly with suspected mild interstitial edema. Superimposed right upper lobe pneumonia is possible.  Patchy left lower lobe opacity, likely a combination of atelectasis and small pleural effusion.  No pneumothorax.  Endotracheal tube terminates 4 cm above the carina.  Enteric tube courses into the stomach.  IMPRESSION: Endotracheal tube terminates 4 cm above the carina.  Cardiomegaly with mild interstitial edema and small left pleural effusion.  Superimposed right upper lobe pneumonia is possible.   Original Report Authenticated By: Charline Bills, M.D.   Dg Chest Portable 1 View  11/09/2012   *RADIOLOGY REPORT*  Clinical Data: Bradycardia.  PORTABLE CHEST - 1 VIEW  Comparison: Chest 11/09/2012 and 1748 hours.  Findings: Support tubes and lines are unchanged.  Pulmonary edema and pleural effusions seen on the prior study persist but have improved.  There is cardiomegaly.  No pneumothorax.  IMPRESSION: Improved pulmonary edema.   Original Report Authenticated By: Holley Dexter, M.D.   Dg Chest Portable 1 View  11/09/2012   *RADIOLOGY REPORT*  Clinical Data: Respiratory distress.  PORTABLE CHEST - 1 VIEW  Comparison: Plain film chest 10/31/2012.  Findings: The patient has a new endotracheal tube in place with the tip in good  position 2.7 cm above  the carina.  There is extensive bilateral airspace disease and small effusions, larger on the right.  Cardiomegaly is noted.  IMPRESSION:  1.  ET tube in good position. 2.  Extensive bilateral airspace disease and small effusions likely due to pulmonary edema.   Original Report Authenticated By: Holley Dexter, M.D.   Dg Abd Portable 1v  11/28/2012   *RADIOLOGY REPORT*  Clinical Data: Evaluate barium prior to potential percutaneous gastrostomy tube placement  PORTABLE ABDOMEN - 1 VIEW  Comparison: CT abdomen pelvis - 11/22/2012; chest radiograph - 11/25/2012  Findings:  Enteric contrast is seen throughout the colon.  No evidence of enteric obstruction.  No supine evidence of pneumoperitoneum.  No definite pneumatosis or portal venous gas. An enteric tube tip and side port project over the expected location of the gastric antrum.  Limited visualization of the lower thorax demonstrates enlarged cardiac silhouette.  Grossly unchanged bones.  IMPRESSION: Enteric contrast seen throughout the colon.  No evidence of obstruction.   Original Report Authenticated By: Tacey Ruiz, MD   Dg Abd Portable 1v  11/24/2012   *RADIOLOGY REPORT*  Clinical Data: Feeding tube placement.  PORTABLE ABDOMEN - 1 VIEW  Comparison: 11/21/2012  Findings: Nasogastric tube is in the distal stomach region. Nonspecific bowel gas pattern.  Limited evaluation of the lung bases.  IMPRESSION: Nasogastric tube is in the distal stomach region.   Original Report Authenticated By: Richarda Overlie, M.D.   Dg Abd Portable 1v  11/21/2012   *RADIOLOGY REPORT*  Clinical Data: Evaluate vomiting.  PORTABLE ABDOMEN - 1 VIEW  Comparison: 11/13/2012.  Findings: Enteric tube has been placed with its tip in the stomach. Bowel gas pattern is nonobstructive.  Within limits of evaluation on this supine radiograph, no visible free air.  IMPRESSION: Enteric tube tip lies along the greater curvature of the stomach.   Original Report Authenticated By: Davonna Belling, M.D.    Dg Abd Portable 1v  11/13/2012   *RADIOLOGY REPORT*  Clinical Data: OG tube placement  PORTABLE ABDOMEN - 1 VIEW  Comparison: None  Findings: Enteric tube tip is in the stomach.  The side port is below the GE junction. Bowel gas pattern appears well within normal limits.  IMPRESSION:  1.  Enteric tube placement with tip in the stomach.   Original Report Authenticated By: Signa Kell, M.D.    PHYSICAL EXAM  there is decreased mental status. This appears unchanged. Lungs reveal scattered rhonchi. Cardiac exam reveals S1 and S2.   TELEMETRY: I have reviewed telemetry today December 07, 2012. Atrial fibrillation rate is controlled.   ASSESSMENT AND PLAN:    Acute on chronic diastolic congestive heart failure      Currently volume is being managed by critical care medicine in combination with treatment of pulmonary status    CHRONIC KIDNEY DISEASE STAGE V    FIBRILLATION, ATRIAL      On the current medications atrial fibrillation controlled.    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     Warfarin anticoagulation     [name] 12/07/2012 8:37 AM

## 2012-12-07 NOTE — Clinical Social Work Psychosocial (Signed)
Clinical Social Work Department BRIEF PSYCHOSOCIAL ASSESSMENT 12/07/2012  Patient:  [name], [name]     Account Number:  [id number]     Admit date:  11/09/2012  Clinical Social Worker:  Lavell Luster  Date/Time:  12/07/2012 02:21 PM  Referred by:  Physician  Date Referred:  12/07/2012 Referred for  SNF Placement   Other Referral:   Interview type:  Family Other interview type:   CSW attempted to interview patient, but patient was not alert and oriented.    PSYCHOSOCIAL DATA Living Status:  ALONE Admitted from facility:   Level of care:   Primary support name:  [name] [phone] Primary support relationship to patient:  CHILD, ADULT Degree of support available:   Patient has 4 daughters and 3 sons. The daughters are the most involved with patient. Support appears to be strong. Other daughters, [name] [phone], [name] [phone], and [name] [phone].    CURRENT CONCERNS Current Concerns  Post-Acute Placement   Other Concerns:    SOCIAL WORK ASSESSMENT / PLAN CSW attempted to meet with patient but patient but patient was not alert and oriented. CSW contacted patients daughter [name]) tpo discuss the recommendation for SNF and to request permission to begin the bed search process. CSW informed daughter of the SNF search process and explained that the patient's case will be somewhat different given her medical needs (28% trach collar) and that there may be limited options. Patient's daughter stated the the patient has stayed at [facility] in the past and would like to place patient there. CSW will follow up with [facility] to determine whether or not they have trach beds. Prior to hospital admission patient was living at home alone.   Assessment/plan status:  Psychosocial Support/Ongoing Assessment of Needs Other assessment/ plan:   Follow up with [facility] to  determine whether or not they accept trach patients.   Information/referral to community resources:   SNF list left in patient's room for family's review.    PATIENT'S/FAMILY'S RESPONSE TO PLAN OF CARE: Daughters agreed to SNF search and are involved in patient's care plan. Daughters would like patient to be placed at [facility] and [facility].       [name], [place], [place], [phone]

## 2012-12-07 NOTE — Consult Note (Signed)
Reason for Consult:Pancreatic mass Referring Physician:  Luberta Robertson, MD  [name] is an 72 y.o. female.  HPI:  Pt is a 72 yo female admitted to the ICU for CHF exacerbation.  She had CT to evaluate her infiltrates.  She was also complaining of abdominal pain, so CT abdomen was ordered.  She has renal insufficiency, so contrast was held.  She had nodule along inf border of the pancreas, not well characterized due to motion.  Her disease course has been such that she now has trach and PEG, and is in stepdown unit.  She was not able to converse or interact with me.    Past Medical History  Diagnosis Date  . CAD (coronary artery disease)     a. Nonobst by cath 11/11: LAD 40%, mid-dist 25-30%; prox CFX 30%, mid 40%; OM 50%; PDA 50%; PLV 50%;  EF 65%.  . NSTEMI (non-ST elevated myocardial infarction)     a. In setting of AFib with RVR 03/2010, type 2. b. Again in 11/2011 felt 2/2 afib.  . Atrial fibrillation     a. DCCV 11/17/11. b. On coumadin & amiodarone.  Marland Kitchen HOCM (hypertrophic obstructive cardiomyopathy)     a. Echo 11/2011:severe LVH, EF 65-70%, mid-cavity gradient up to . b. Echo 06/2012: EF 65-70%, severe LVH, grade 2 d/dysf.  Marland Kitchen Hypertension   . Tobacco abuse   . CKD (chronic kidney disease), stage IV   . Diastolic CHF     preserved LVF  . Iron deficiency anemia   . Third degree uterine prolapse   . Hx MRSA infection   . Hx of cardiovascular stress test     a. Lex MV 12/13:  EF 56%, no ischemia    . Diabetes   . Hyperlipidemia   . Respiratory failure     Multiple admissions for resp failure requiring intubation.  . Hypercalcemia   . LBBB (left bundle branch block)     History of transient LBBB  . C. difficile colitis 01/2012    Past Surgical History  Procedure Laterality Date  . Tubal ligation    . Cardioversion  11/17/2011    Procedure: CARDIOVERSION;  Surgeon: Hillis Range, MD;  Location: [facility] OR;  Service: Cardiovascular;  Laterality: N/A;    Family History   Problem Relation Age of Onset  . Coronary artery disease Neg Hx   . Atrial fibrillation Neg Hx   . Diabetes Daughter     Social History:  reports that she quit smoking about 10 months ago. Her smoking use included Cigarettes. She has a 7.5 pack-year smoking history. She has never used smokeless tobacco. She reports that she does not drink alcohol or use illicit drugs.  Allergies: No Known Allergies  Medications: I have reviewed the patient's current medications.  Results for orders placed during the hospital encounter of 11/09/12 (from the past 48 hour(s))  GLUCOSE, CAPILLARY     Status: Abnormal   Collection Time    12/05/12 11:36 AM      Result Value Range   Glucose-Capillary 188 (*) 70 - 99 mg/dL  GLUCOSE, CAPILLARY     Status: Abnormal   Collection Time    12/05/12  3:27 PM      Result Value Range   Glucose-Capillary 172 (*) 70 - 99 mg/dL  GLUCOSE, CAPILLARY     Status: Abnormal   Collection Time    12/05/12  8:28 PM      Result Value Range   Glucose-Capillary 300 (*) 70 -  99 mg/dL  GLUCOSE, CAPILLARY     Status: Abnormal   Collection Time    12/05/12 11:39 PM      Result Value Range   Glucose-Capillary 281 (*) 70 - 99 mg/dL  GLUCOSE, CAPILLARY     Status: None   Collection Time    12/06/12  4:13 AM      Result Value Range   Glucose-Capillary 73  70 - 99 mg/dL  PROTIME-INR     Status: Abnormal   Collection Time    12/06/12  5:00 AM      Result Value Range   Prothrombin Time 26.2 (*) 11.6 - 15.2 seconds   INR 2.50 (*) 0.00 - 1.49  BASIC METABOLIC PANEL     Status: Abnormal   Collection Time    12/06/12  5:00 AM      Result Value Range   Sodium 151 (*) 135 - 145 mEq/L   Potassium 3.0 (*) 3.5 - 5.1 mEq/L   Chloride 116 (*) 96 - 112 mEq/L   CO2 24  19 - 32 mEq/L   Glucose, Bld 69 (*) 70 - 99 mg/dL   BUN 66 (*) 6 - 23 mg/dL   Creatinine, Ser 0.98 (*) 0.50 - 1.10 mg/dL   Calcium 11.9 (*) 8.4 - 10.5 mg/dL   GFR calc non Af Amer 25 (*) >90 mL/min   GFR calc Af  Amer 29 (*) >90 mL/min   Comment:            The eGFR has been calculated     using the CKD EPI equation.     This calculation has not been     validated in all clinical     situations.     eGFR's persistently     <90 mL/min signify     possible Chronic Kidney Disease.  CBC     Status: Abnormal   Collection Time    12/06/12  5:00 AM      Result Value Range   WBC 17.0 (*) 4.0 - 10.5 K/uL   RBC 3.46 (*) 3.87 - 5.11 MIL/uL   Hemoglobin 9.3 (*) 12.0 - 15.0 g/dL   HCT 14.7 (*) 82.9 - 56.2 %   MCV 83.2  78.0 - 100.0 fL   MCH 26.9  26.0 - 34.0 pg   MCHC 32.3  30.0 - 36.0 g/dL   RDW 13.0 (*) 86.5 - 78.4 %   Platelets 216  150 - 400 K/uL  GLUCOSE, CAPILLARY     Status: None   Collection Time    12/06/12  7:17 AM      Result Value Range   Glucose-Capillary 81  70 - 99 mg/dL  GLUCOSE, CAPILLARY     Status: Abnormal   Collection Time    12/06/12 12:35 PM      Result Value Range   Glucose-Capillary 181 (*) 70 - 99 mg/dL  GLUCOSE, CAPILLARY     Status: Abnormal   Collection Time    12/06/12  3:45 PM      Result Value Range   Glucose-Capillary 182 (*) 70 - 99 mg/dL  GLUCOSE, CAPILLARY     Status: Abnormal   Collection Time    12/06/12  8:08 PM      Result Value Range   Glucose-Capillary 241 (*) 70 - 99 mg/dL  GLUCOSE, CAPILLARY     Status: None   Collection Time    12/07/12  4:48 AM      Result Value Range  Glucose-Capillary 97  70 - 99 mg/dL   Comment 1 Notify RN    PROTIME-INR     Status: Abnormal   Collection Time    12/07/12  5:00 AM      Result Value Range   Prothrombin Time 29.8 (*) 11.6 - 15.2 seconds   INR 2.96 (*) 0.00 - 1.49  CBC     Status: Abnormal   Collection Time    12/07/12  5:00 AM      Result Value Range   WBC 17.6 (*) 4.0 - 10.5 K/uL   RBC 3.35 (*) 3.87 - 5.11 MIL/uL   Hemoglobin 9.0 (*) 12.0 - 15.0 g/dL   HCT 40.9 (*) 81.1 - 91.4 %   MCV 83.3  78.0 - 100.0 fL   MCH 26.9  26.0 - 34.0 pg   MCHC 32.3  30.0 - 36.0 g/dL   RDW 78.2 (*) 95.6 - 21.3 %    Platelets 175  150 - 400 K/uL   Comment: PLATELET COUNT CONFIRMED BY SMEAR  COMPREHENSIVE METABOLIC PANEL     Status: Abnormal   Collection Time    12/07/12  5:00 AM      Result Value Range   Sodium 152 (*) 135 - 145 mEq/L   Potassium 3.6  3.5 - 5.1 mEq/L   Chloride 120 (*) 96 - 112 mEq/L   CO2 22  19 - 32 mEq/L   Glucose, Bld 114 (*) 70 - 99 mg/dL   BUN 64 (*) 6 - 23 mg/dL   Creatinine, Ser 0.86 (*) 0.50 - 1.10 mg/dL   Calcium 57.8 (*) 8.4 - 10.5 mg/dL   Total Protein 6.7  6.0 - 8.3 g/dL   Albumin 2.8 (*) 3.5 - 5.2 g/dL   AST 22  0 - 37 U/L   ALT 24  0 - 35 U/L   Alkaline Phosphatase 110  39 - 117 U/L   Total Bilirubin 0.7  0.3 - 1.2 mg/dL   GFR calc non Af Amer 25 (*) >90 mL/min   GFR calc Af Amer 29 (*) >90 mL/min   Comment:            The eGFR has been calculated     using the CKD EPI equation.     This calculation has not been     validated in all clinical     situations.     eGFR's persistently     <90 mL/min signify     possible Chronic Kidney Disease.  GLUCOSE, CAPILLARY     Status: Abnormal   Collection Time    12/07/12  7:39 AM      Result Value Range   Glucose-Capillary 125 (*) 70 - 99 mg/dL    Dg Chest Port 1 View  12/06/2012   *RADIOLOGY REPORT*  Clinical Data: Tracheostomy tube placement.  PORTABLE CHEST - 1 VIEW  Comparison: December 05, 2012.  Findings: Tracheostomy tube is in grossly good position.  Right- sided PICC line is unchanged in position.  Stable cardiomegaly is noted.  Mild interstitial densities are noted throughout both lungs which are not changed compared to prior exam.  No pneumothorax or pleural effusion is noted.  IMPRESSION: No significant change compared to prior exam.   Original Report Authenticated By: Lupita Raider.,  M.D.    Review of Systems  Unable to perform ROS: patient nonverbal   Blood pressure 117/72, pulse 80, temperature 98.4 F (36.9 C), temperature source Axillary, resp. rate 22, height 5\' 2"  (1.575  m), weight 177 lb 4 oz  (80.4 kg), SpO2 96.00%. Physical Exam  Constitutional: She is oriented to person, place, and time. She appears well-developed and well-nourished. No distress.  HENT:  Head: Normocephalic and atraumatic.  Eyes: Pupils are equal, round, and reactive to light. No scleral icterus.  Neck: Normal range of motion. Neck supple. No thyromegaly present.  trach  Cardiovascular: Normal rate, regular rhythm, normal heart sounds and intact distal pulses.   Respiratory: Effort normal. No respiratory distress.  Coarse breath sounds transmitted.  GI: Soft. Bowel sounds are normal. She exhibits no distension. There is no tenderness. There is no rebound and no guarding.  Musculoskeletal: She exhibits no edema.  Neurological: She is alert and oriented to person, place, and time.  Skin: Skin is warm and dry. No rash noted. She is not diaphoretic. No erythema. No pallor.  Psychiatric: She has a normal mood and affect. Her behavior is normal. Judgment and thought content normal.    Assessment/Plan: Pancreatic mass. Recommend workup as tolerated by patient. MRCP If definite mass there, would refer to GI for Endoscopic ultrasound and biopsy. Needs LFTs  Unfortunately, I think she will probably never be a surgical candidate based on her overall health status.  Given the fact that she required trach and PEG for CHF exacerbation, I think her physiologic reserve is probably too low to withstand whipple.  However, it is usually still beneficial to have diagnosis of mass in order to guide overall health decisions.    Recommend follow up as outpatient.    Tonantzin Mimnaugh 12/07/2012, 8:37 AM

## 2012-12-07 NOTE — Progress Notes (Signed)
[facility] - Treatment Patient Details  Name: [name] MRN: [id number] Date of Birth: 1941-04-17  Today's Date: 12/07/2012 Time: 1440-1450 SLP Time Calculation (min): 10 min  Past Medical History:  Past Medical History  Diagnosis Date  . CAD (coronary artery disease)     a. Nonobst by cath 11/11: LAD 40%, mid-dist 25-30%; prox CFX 30%, mid 40%; OM 50%; PDA 50%; PLV 50%;  EF 65%.  . NSTEMI (non-ST elevated myocardial infarction)     a. In setting of AFib with RVR 03/2010, type 2. b. Again in 11/2011 felt 2/2 afib.  . Atrial fibrillation     a. DCCV 11/17/11. b. On coumadin & amiodarone.  Marland Kitchen HOCM (hypertrophic obstructive cardiomyopathy)     a. Echo 11/2011:severe LVH, EF 65-70%, mid-cavity gradient up to . b. Echo 06/2012: EF 65-70%, severe LVH, grade 2 d/dysf.  Marland Kitchen Hypertension   . Tobacco abuse   . CKD (chronic kidney disease), stage IV   . Diastolic CHF     preserved LVF  . Iron deficiency anemia   . Third degree uterine prolapse   . Hx MRSA infection   . Hx of cardiovascular stress test     a. Lex MV 12/13:  EF 56%, no ischemia    . Diabetes   . Hyperlipidemia   . Respiratory failure     Multiple admissions for resp failure requiring intubation.  . Hypercalcemia   . LBBB (left bundle branch block)     History of transient LBBB  . C. difficile colitis 01/2012   Past Surgical History:  Past Surgical History  Procedure Laterality Date  . Tubal ligation    . Cardioversion  11/17/2011    Procedure: CARDIOVERSION;  Surgeon: Hillis Range, MD;  Location: [facility];  Service: Cardiovascular;  Laterality: N/A;    Assessment / Plan / Recommendation Clinical Impression  Pt tolerated PMSV for approximately 20 minutes today throughout swallowing evaluation. Pt produced minimal vocalizations, but when she did the phonation was clear but reduced in volume. No evidence of CO2 retention noted. Overall VS remained stable. Will continue trials with Speech only at this  time.    Plan  Continue with current plan of care    Follow Up Recommendations  LTACH    Pertinent Vitals/Pain N/A    SLP Goals Potential to Achieve Goals: Fair Progress/Goals/Alternative treatment plan discussed with pt/caregiver and they: Agree SLP Goal #1: Pt. will tolerate cuff deflation for 5 minutes with minimal coughing and maintaining Sats. above 95% SLP Goal #1 - Progress: Met SLP Goal #2: Pt. will redirect air with finger occlussion. SLP Goal #2 - Progress: Met SLP Goal #3: Pt. will wear PMSV for 5 minutes with all VSS. SLP Goal #3 - Progress: Met SLP Goal #4: New goal 7/31- Pt. will wear PMSV with all VSS for 30-45 with all therapies. SLP Goal #4 - Progress: Progressing toward goal SLP Goal #5: Pt. will complete swallow study and speech/language/cognitive evaluation with PMSV in place. SLP Goal #5 - Progress: Met   PMSV Trial  PMSV was placed for: 20 minutes Able to redirect subglottic air through upper airway: Yes Able to Attain Phonation: Yes Voice Quality: Normal;Low vocal intensity Able to Expectorate Secretions:  (Not observed) Breath Support for Phonation: Mildly decreased Intelligibility: Intelligibility reduced Word: 75-100% accurate Phrase: 50-74% accurate Respirations During Trial: 26 (fluctuated in the 20s-low 30s briefly) SpO2 During Trial: 96 % (mostly mid 90s) Pulse During Trial: 78 Behavior: Confused;Poor eye contact   Tracheostomy  Tube    #6 Shiley, cuffed   Vent Dependency  FiO2 (%): 28 %    Cuff Deflation Trial  GO     Tolerated Cuff Deflation: Yes (cuff deflated at baseline) Length of Time for Cuff Deflation Trial: 30 minutes (cuff deflated at baseline and left deflated upon departure) Behavior: Confused;Poor eye contact   Maxcine Ham 12/07/2012, 4:02 PM  Maxcine Ham, M.A. CCC-SLP

## 2012-12-07 NOTE — Procedures (Signed)
Objective Swallowing Evaluation: Fiberoptic Endoscopic Evaluation of Swallowing  Patient Details  Name: [name] MRN: [id number] Date of Birth: 1940/07/31  Today's Date: 12/07/2012 Time: 4098-1191 SLP Time Calculation (min): 26 min  Past Medical History:  Past Medical History  Diagnosis Date  . CAD (coronary artery disease)     a. Nonobst by cath 11/11: LAD 40%, mid-dist 25-30%; prox CFX 30%, mid 40%; OM 50%; PDA 50%; PLV 50%;  EF 65%.  . NSTEMI (non-ST elevated myocardial infarction)     a. In setting of AFib with RVR 03/2010, type 2. b. Again in 11/2011 felt 2/2 afib.  . Atrial fibrillation     a. DCCV 11/17/11. b. On coumadin & amiodarone.  Marland Kitchen HOCM (hypertrophic obstructive cardiomyopathy)     a. Echo 11/2011:severe LVH, EF 65-70%, mid-cavity gradient up to . b. Echo 06/2012: EF 65-70%, severe LVH, grade 2 d/dysf.  Marland Kitchen Hypertension   . Tobacco abuse   . CKD (chronic kidney disease), stage IV   . Diastolic CHF     preserved LVF  . Iron deficiency anemia   . Third degree uterine prolapse   . Hx MRSA infection   . Hx of cardiovascular stress test     a. Lex MV 12/13:  EF 56%, no ischemia    . Diabetes   . Hyperlipidemia   . Respiratory failure     Multiple admissions for resp failure requiring intubation.  . Hypercalcemia   . LBBB (left bundle branch block)     History of transient LBBB  . C. difficile colitis 01/2012   Past Surgical History:  Past Surgical History  Procedure Laterality Date  . Tubal ligation    . Cardioversion  11/17/2011    Procedure: CARDIOVERSION;  Surgeon: Hillis Range, MD;  Location: [facility] OR;  Service: Cardiovascular;  Laterality: N/A;   HPI:  72 y/o female with CHF was admitted on 7/3 from the [facility] ED with acute hypoxemic respiratory failure due to a CHF exacerbation, ?Superimposed PNA.  Marland KitchenLINES / TUBES:7/3 ETT >>11/13/12, 11/13/12  (failed extubation immediately, ? aspirated) >> 7/17; Trach 7/17;     Assessment / Plan / Recommendation Clinical  Impression  Dysphagia Diagnosis: Moderate oral phase dysphagia;Moderate pharyngeal phase dysphagia Clinical impression: Pt presents with a moderate oropharyngeal dysphagia that appears to be exacerbated by the pt's current cognitive status. Pt demonstrated oral holding of boluses with reduced cohesion and posterior propulsion resulting in premature spillage of material to the pyriform sinuses with a moderate delay despite Max cues to swallow. The pharyngeal phase was also marked by generalized weakness resulting in residue after the swallow with puree. Although no penetration/aspiration was observed during this study, pt's current cognitive status significantly increases her risk of aspiration and therefore a PO diet is not yet recommended at this time.    Treatment Recommendation  Therapy as outlined in treatment plan below    Diet Recommendation NPO;Alternative means - temporary (Pt with PEG)   Medication Administration: Via alternative means    Other  Recommendations Oral Care Recommendations: Oral care QID   Follow Up Recommendations  LTACH    Frequency and Duration min 2x/week  2 weeks   Pertinent Vitals/Pain N/A    SLP Swallow Goals Goal #3: Pt will sustain attention for >60 seconds to consume PO trials with SLP. Swallow Study Goal #3 - Progress: Other (comment) (Goal set 7/31)   General Date of Onset: 11/09/12 HPI: 72 y/o female with CHF was admitted on 7/3 from the [facility] ED  with acute hypoxemic respiratory failure due to a CHF exacerbation, ?Superimposed PNA.  Marland KitchenLINES / TUBES:7/3 ETT >>11/13/12, 11/13/12  (failed extubation immediately, ? aspirated) >> 7/17; Trach 7/17; Type of Study: Fiberoptic Endoscopic Evaluation of Swallowing Reason for Referral: Objectively evaluate swallowing function Diet Prior to this Study: NPO;PEG tube Temperature Spikes Noted: No Respiratory Status: Trach Trach Size and Type: Cuff;#6;With PMSV in place;Other (Comment) (PMSV placed for study) History of  Recent Intubation: Yes Length of Intubations (days): 5 days Date extubated: 11/13/12 (Pt received trach) Behavior/Cognition: Lethargic;Requires cueing;Doesn't follow directions Oral Cavity - Dentition: Edentulous Self-Feeding Abilities: Total assist Patient Positioning: Upright in bed Baseline Vocal Quality: Clear;Other (comment) (with PMSV in place) Volitional Cough: Cognitively unable to elicit Volitional Swallow: Unable to elicit Anatomy: Other (Comment) (s/p trach) Pharyngeal Secretions: Standing secretions in (comment) (dried secretions along posterior pharyngeal wall)    Reason for Referral Objectively evaluate swallowing function   Oral Phase Oral Preparation/Oral Phase Oral Phase: Impaired Oral - Thin Oral - Thin Teaspoon: Weak lingual manipulation;Incomplete tongue to palate contact;Reduced posterior propulsion;Holding of bolus;Delayed oral transit;Other (Comment) (ice chip, not water by spoon) Oral - Solids Oral - Puree: Weak lingual manipulation;Incomplete tongue to palate contact;Reduced posterior propulsion;Holding of bolus;Lingual/palatal residue;Delayed oral transit   Pharyngeal Phase Pharyngeal Phase Pharyngeal Phase: Impaired Pharyngeal - Thin Pharyngeal - Ice Chips: Premature spillage to pyriform sinuses;Reduced pharyngeal peristalsis;Reduced anterior laryngeal mobility;Reduced laryngeal elevation;Reduced tongue base retraction Pharyngeal - Solids Pharyngeal - Puree: Premature spillage to pyriform sinuses;Reduced pharyngeal peristalsis;Reduced anterior laryngeal mobility;Reduced laryngeal elevation;Reduced tongue base retraction  Cervical Esophageal Phase    GO    Cervical Esophageal Phase Cervical Esophageal Phase: [facility]         Maxcine Ham 12/07/2012, 3:51 PM  Maxcine Ham, M.A. CCC-SLP

## 2012-12-08 LAB — BASIC METABOLIC PANEL
BUN: 69 mg/dL — ABNORMAL HIGH (ref 6–23)
BUN: 65 mg/dL — ABNORMAL HIGH (ref 6–23)
CO2: 20 mEq/L (ref 19–32)
Chloride: 118 mEq/L — ABNORMAL HIGH (ref 96–112)
Creatinine, Ser: 2.08 mg/dL — ABNORMAL HIGH (ref 0.50–1.10)
Creatinine, Ser: 2.04 mg/dL — ABNORMAL HIGH (ref 0.50–1.10)
GFR calc Af Amer: 26 mL/min — ABNORMAL LOW (ref >90)
GFR calc Af Amer: 27 mL/min — ABNORMAL LOW (ref >90)
GFR calc non Af Amer: 23 mL/min — ABNORMAL LOW (ref >90)
Glucose, Bld: 320 mg/dL — ABNORMAL HIGH (ref 70–99)

## 2012-12-08 LAB — CBC
HCT: 25.9 % — ABNORMAL LOW (ref 36.0–46.0)
Hemoglobin: 8.2 g/dL — ABNORMAL LOW (ref 12.0–15.0)
MCH: 26.4 pg (ref 26.0–34.0)
MCHC: 31.7 g/dL (ref 30.0–36.0)
MCV: 83.3 fL (ref 78.0–100.0)
RDW: 19.1 % — ABNORMAL HIGH (ref 11.5–15.5)

## 2012-12-08 LAB — PROTIME-INR: INR: 3.12 — ABNORMAL HIGH (ref 0.00–1.49)

## 2012-12-08 MED ORDER — FUROSEMIDE 10 MG/ML IJ SOLN
60.0000 mg | Freq: Once | INTRAMUSCULAR | Status: AC
  Administered 2012-12-08: 60 mg via INTRAVENOUS

## 2012-12-08 MED ORDER — INSULIN GLARGINE 100 UNIT/ML ~~LOC~~ SOLN
5.0000 [IU] | Freq: Every day | SUBCUTANEOUS | Status: DC
  Administered 2012-12-08 – 2012-12-22 (×15): 5 [IU] via SUBCUTANEOUS
  Filled 2012-12-08 (×16): qty 0.05

## 2012-12-08 MED ORDER — FUROSEMIDE 10 MG/ML IJ SOLN
INTRAMUSCULAR | Status: AC
  Administered 2012-12-08: 10 mg
  Filled 2012-12-08: qty 8

## 2012-12-08 MED ORDER — WARFARIN SODIUM 1 MG PO TABS
1.0000 mg | ORAL_TABLET | Freq: Once | ORAL | Status: AC
  Administered 2012-12-08: 1 mg via ORAL
  Filled 2012-12-08: qty 1

## 2012-12-08 NOTE — Clinical Social Work Note (Signed)
CSW spoke to [facility] [phone] who spoke with family and assessed pt at bedside today.  Pt projected to  d/c to [facility] tomorrow (Saturday).  Admission coordinator for the weekend will be [place] and she can be reached at [phone]  Vickii Penna, LCSWA [phone]  Clinical Social Work

## 2012-12-08 NOTE — Procedures (Addendum)
Tracheostomy Change Note  Patient Details:   Name: [name] DOB: 01-04-1941 MRN: [id number]    Airway Documentation:     Evaluation  O2 sats: stable throughout Complications: No apparent complications Patient did tolerate procedure well. Bilateral Breath Sounds: Diminished Suctioning: Airway   RT downsized pt. Trache to 4 cuffless as per MD order. Positive color change with Co2 detector, bilateral breath sounds. Sterile procedures were met. No apparent complications. Vitals remained stable throughout the procedure.  Adela Ports 12/08/2012, 11:47 PM

## 2012-12-08 NOTE — Progress Notes (Signed)
PULMONARY  / CRITICAL CARE MEDICINE  Name: [name] MRN: [id number] DOB: 1940/12/15    ADMISSION DATE:  11/09/2012 CONSULTATION DATE:  11/09/2012  REFERRING MD :  Preston Fleeting PRIMARY SERVICE: PCCM  CHIEF COMPLAINT:  Shortness of breath.  BRIEF PATIENT DESCRIPTION: 72 y/o female with CHF was admitted on 7/3 from the [facility] ED with acute hypoxemic respiratory failure due to a CHF exacerbation, ?Superimposed PNA.  LINES / TUBES: 7/3 ETT >>11/13/12, 11/13/12  (failed extubation immediately, ? aspirated) >> 7/17 7/4 CVC R IJ >> 7/17 7/5 A-line >> 7/18 7/17 Trach (DF) >> 7/17 Rt PICC >>  7/22 PEG >>>  CULTURES: 7/4 U strep >> neg 7/4 legionella >> neg 7/3 mrsa PCR >> POSITIVE 7/8 mini-BAL >> yeast 7/16 sputum >> yeast  7/16 blood >> neg 7/16 urine >> yeast 50 K colonies 7/26 C. Diff>>>neg  ANTIBIOTICS: 7/3 Ceftriaxone >>7/6 7/4 levofloxacin >>7/7 7/4 Vancomycin >>7/6, restarted 7/8 >> 7/18 7/7 Unasyn >> 7/16 7/16 cefepime >> 7/24  SIGNIFICANT EVENTS: 7/3 admission 7/07 reintubated immediately after extubation.  ? Aspiration. ABX restarted.  on Neo-Synephrine.  7/09 Able to wean fio2 and neo. Lopressor required for rate control of afib. 7/10 Off neo, but now tachycardic.  Failed SBT this AM, but RT felt it was due to sedation.  Sedation weaned 7/14 Hypotensive, vomiting, amiodarone turned off for low heart rate 7/16 Change to pressure control 7/17 Start pressors, PRBC transfusion, trach 7/18 Off pressors, PRBC transfusion 7/28- 10-12 hrs trach collar trial 7/29- recs from surgery for pancreatic lesions obtained  STUDIES: 7/16 CT chest >> emphysema, b/l ASD, small effusions 7/16 CT abd/pelvis >> 2.6 x 2 cm nodule in head of pancreas, bone changes suggestive of Paget's disease  SUBJECTIVE: trach collaring well  VITAL SIGNS: Temp:  [97.4 F (36.3 C)-99 F (37.2 C)] 97.9 F (36.6 C) (08/01 0806) Pulse Rate:  [65-100] 75 (08/01 0806) Resp:  [14-29] 29 (08/01 0806) BP:  (108-137)/(54-82) 128/82 mmHg (08/01 0806) SpO2:  [95 %-100 %] 99 % (08/01 0806) FiO2 (%):  [28 %] 28 % (08/01 0806)  HEMODYNAMICS: CVP:  [9 mmHg-11 mmHg] 10 mmHg  VENTILATOR SETTINGS: Vent Mode:  [-]  FiO2 (%):  [28 %] 28 %  INTAKE / OUTPUT: Intake/Output     07/31 0701 - 08/01 0700 08/01 0701 - 08/02 0700   I.V. (mL/kg) 730 (9.1) 30 (0.4)   NG/GT 1480 55   Total Intake(mL/kg) 2210 (27.5) 85 (1.1)   Urine (mL/kg/hr) 625 (0.3)    Total Output 625     Net +1585 +85        Urine Occurrence 1 x     PHYSICAL EXAMINATION: Gen: Laying in bed, in NAD on trach collar HEENT: PEG in place, trach site clean PULM: cta CV: irregular, no m/r/g AB: soft, non tender, BS+ Ext: no edema Neuro: RASS 0, follows commands, more interactive  LABS: CBC Recent Labs     12/06/12  0500  12/07/12  0500  12/08/12  0500  WBC  17.0*  17.6*  18.4*  HGB  9.3*  9.0*  8.2*  HCT  28.8*  27.9*  25.9*  PLT  216  175  185   Coag's Recent Labs     12/06/12  0500  12/07/12  0500  12/08/12  0500  INR  2.50*  2.96*  3.12*   BMET Recent Labs     12/06/12  0500  12/07/12  0500  12/08/12  0500  NA  151*  152*  146*  K  3.0*  3.6  4.5  CL  116*  120*  117*  CO2  24  22  18*  BUN  66*  64*  69*  CREATININE  1.90*  1.92*  2.08*  GLUCOSE  69*  114*  320*   Electrolytes Recent Labs     12/06/12  0500  12/07/12  0500  12/08/12  0500  CALCIUM  12.4*  13.0*  12.5*   Glucose Recent Labs     12/07/12  1212  12/07/12  1549  12/07/12  2002  12/07/12  2358  12/08/12  0434  12/08/12  0809  GLUCAP  297*  247*  190*  181*  202*  246*    CXR: 7/30 - no significant change - continues to to have mild interstitial densities bil  ASSESSMENT / PLAN:  PULMONARY A:  Acute respiratory failure 2nd to pulmonary infiltrates, resolving. effusions P:   - continue wean on TC 28%, at goal - PRN BD's. - control rate to avoid diastolic CHF - even balance goal to slight positive -will consider  trach change to 4 cuffless today  CARDIOVASCULAR A:  HOCM with acute on chronic diastolic heart failure. A fib with RVR >> amiodarone d/c due to bradycardia, hypotension. Hx of CAD, HTN, hyperlipidemia. Shock >> resolved P:  - Anticoagulation managed by Cards. coumadin - Continue Cardizem PO for rate control 30 q6 hours.  RENAL A: Acute kidney injury >> renal fx stable. Hypernatremia - improving  Hypokalemia - resolved Hypercalcemia  Hyperphosphatemia Mag WNL P:   -allow pos balance -maintain free water  -continue d5w slight at 30, na in am    GASTROINTESTINAL A:   Protein malnutrition Vomiting - resolved Mass head of pancreas-- Will need further assessment of pancreatic lesion when more stable, likely in LTAC or outpatient, Ca 19-9 elevated (73.1)- concern cancer P:   - PEG tube  - Pepcid for SUP - gen sx consulted, will order MRCP when more confident for safety on trach collar flat in MRI machine -will d/w GI for [place] endoscopic plans in future, LFT noted wnl except albumin low -would repeat swallow post downsize 4 cuffless  GYN A:  Uterine prolapse. P: - GYN consult called on Friday, prolapse per GYN.  HEMATOLOGIC A:  Anemia of chronic disease and critical illness >> PRBC transfusion 7/17, 7/18. Thrombocytopenia  hemoconcentration 8/1 Leukocytosis - WBC 17.6 on 7/31, no fever, most likely due to hemoconcentration  P:  - Monitor CBC in AM - Continue coumadin  INFECTIOUS A: Initial concern for aspiration pneumonia >> has persistent pulmonary infiltrates, and increasing WBC. P:   -no abx, follow fever curve  ENDOCRINE A:  DM2, uncontrolled since 7/31 P:   - SSI -lantus increase  NEUROLOGIC A: Acute encephalopathy 2nd to hypoxia/hypercapnia. Muscular deconditioning. P:   - PT/OT, progress, especially as neuro better and trach collar successful -Consider downsize today  Today's summary: consider change to cuffless 4, mrcp when more stable, will call  GI for planned endo [place] as outpt  Arezu Shekari PA-S  I have fully examined this patient and agree with above findings.    And edited in full  Mcarthur Rossetti. Tyson Alias, MD, FACP Pgr: [phone] [facility]

## 2012-12-08 NOTE — Progress Notes (Signed)
ANTICOAGULATION CONSULT NOTE - Follow Up Consult  Pharmacy Consult for Warfarin Indication: atrial fibrillation  No Known Allergies  Patient Measurements: Height: 5\' 2"  (157.5 cm) Weight: 177 lb 4 oz (80.4 kg) IBW/kg (Calculated) : 50.1  Vital Signs: Temp: 98.7 F (37.1 C) (08/01 1200) Temp src: Axillary (08/01 0806) BP: 150/80 mmHg (08/01 1305) Pulse Rate: 94 (08/01 1305)  Labs:  Recent Labs  12/06/12 0500 12/07/12 0500 12/08/12 0500 12/08/12 1200  HGB 9.3* 9.0* 8.2*  --   HCT 28.8* 27.9* 25.9*  --   PLT 216 175 185  --   LABPROT 26.2* 29.8* 31.0*  --   INR 2.50* 2.96* 3.12*  --   CREATININE 1.90* 1.92* 2.08* 2.04*    Estimated Creatinine Clearance: 24.5 ml/min (by C-G formula based on Cr of 2.04).   Medications:  Scheduled:  . antiseptic oral rinse  15 mL Mouth Rinse QID  . chlorhexidine  15 mL Mouth/Throat BID  . digoxin  0.125 mg Per Tube Q M,W,F,S,S -1800  . diltiazem  60 mg Per Tube Q6H  . famotidine  20 mg Per Tube Daily  . feeding supplement  30 mL Per Tube BID  . free water  300 mL Per Tube Q6H  . hydrocortisone sod succinate (SOLU-CORTEF) inj  25 mg Intravenous Q12H  . insulin aspart  0-20 Units Subcutaneous Q4H  . insulin glargine  5 Units Subcutaneous Daily  . potassium chloride  40 mEq Per Tube BID  . sodium chloride  10-40 mL Intracatheter Q12H  . warfarin  2.5 mg Oral q1800  . Warfarin - Pharmacist Dosing Inpatient   Does not apply q1800    Assessment: 72 yo F on warfarin for AFib. Pts INR is trending up. Pt is currently NPO, on no interacting meds, previously on amiodarone gtt d/c 7/27. No reports of bleed. Pt has mild anemia due to CKD IV, plt wnl.   Goal of Therapy:  INR 2-3 Monitor platelets by anticoagulation protocol: Yes   Plan:  - Reduce Warfarin to 1mg  PO x1 - F/U INR and s/sx of bleed  Forestine Na M 12/08/2012,3:14 PM

## 2012-12-08 NOTE — Progress Notes (Signed)
Passy-Muir Speaking Valve - Treatment Patient Details  Name: [name] MRN: [id number] Date of Birth: 01/11/1941  Today's Date: 12/08/2012 Time: 4098-1191 SLP Time Calculation (min): 9 min  Past Medical History:  Past Medical History  Diagnosis Date  . CAD (coronary artery disease)     a. Nonobst by cath 11/11: LAD 40%, mid-dist 25-30%; prox CFX 30%, mid 40%; OM 50%; PDA 50%; PLV 50%;  EF 65%.  . NSTEMI (non-ST elevated myocardial infarction)     a. In setting of AFib with RVR 03/2010, type 2. b. Again in 11/2011 felt 2/2 afib.  . Atrial fibrillation     a. DCCV 11/17/11. b. On coumadin & amiodarone.  Marland Kitchen HOCM (hypertrophic obstructive cardiomyopathy)     a. Echo 11/2011:severe LVH, EF 65-70%, mid-cavity gradient up to . b. Echo 06/2012: EF 65-70%, severe LVH, grade 2 d/dysf.  Marland Kitchen Hypertension   . Tobacco abuse   . CKD (chronic kidney disease), stage IV   . Diastolic CHF     preserved LVF  . Iron deficiency anemia   . Third degree uterine prolapse   . Hx MRSA infection   . Hx of cardiovascular stress test     a. Lex MV 12/13:  EF 56%, no ischemia    . Diabetes   . Hyperlipidemia   . Respiratory failure     Multiple admissions for resp failure requiring intubation.  . Hypercalcemia   . LBBB (left bundle branch block)     History of transient LBBB  . C. difficile colitis 01/2012   Past Surgical History:  Past Surgical History  Procedure Laterality Date  . Tubal ligation    . Cardioversion  11/17/2011    Procedure: CARDIOVERSION;  Surgeon: Hillis Range, MD;  Location: [facility] OR;  Service: Cardiovascular;  Laterality: N/A;    Assessment / Plan / Recommendation Clinical Impression  Decreased tolerance of PMV today, as evidenced by increased restlessness and increasing RR as placement continued.  No vocalizations elicited today, and no evidence of CO2 retention noted.  PMV removed due to increasing respiratory rate and restlessness.  RN informed.  Will continue trials with SLP  only.  RN reports plan to downsize to size 4 trach this evening.  Possible DC to SNF this weekend.    Plan  Continue with current plan of care    Follow Up Recommendations  Skilled Nursing facility    Pertinent Vitals/Pain Pt restless    SLP Goals Potential Considerations: Cooperation/participation level Progress/Goals/Alternative treatment plan discussed with pt/caregiver and they: Agree SLP Goal #4 - Progress: Progressing toward goal   PMSV Trial  PMSV was placed for: 9 minutes Able to redirect subglottic air through upper airway: Yes Able to Attain Phonation: No Intelligibility: Unable to assess (comment) Respirations During Trial: 27 SpO2 During Trial: 99 % Pulse During Trial: 80 Behavior: Alert;Anxious (restless)   Tracheostomy Tube       Vent Dependency  Vent Dependent: No FiO2 (%): 28 %    Cuff Deflation Trial  Celia B. Murvin Natal [facility], CCC-SLP [phone]     Tolerated Cuff Deflation: Yes Length of Time for Cuff Deflation Trial: Cuff deflated when SLP entered room Behavior: Alert;Confused (restless)   Leigh Aurora 12/08/2012, 2:02 PM

## 2012-12-08 NOTE — Progress Notes (Signed)
Inpatient Diabetes Program Recommendations  AACE/ADA: New Consensus Statement on Inpatient Glycemic Control (2013)  Target Ranges:  Prepandial:   less than 140 mg/dL      Peak postprandial:   less than 180 mg/dL (1-2 hours)      Critically ill patients:  140 - 180 mg/dL   Hyperglycemia while on steroid therapy and tube feedings  Please consider adding Tube Feed coverage of Novolog 3-4 units q 4 hrs while tube feeds are being given in presence of steroid therapy. (can hold if tube feed is held or interrupted) this (to prevent need for high dose correction which presents potentia for hypoglycemia.  Then can decrease correction to moderate q 4 hrs  Thank you, Lenor Coffin, RN, CNS, Diabetes Coordinator [phone])

## 2012-12-08 NOTE — Progress Notes (Signed)
Foley continues to leak despite numerous replacements. Positive response to Lasix. Awaiting downsizing of trach and removal of sutures prior to d/c tomorrow.

## 2012-12-08 NOTE — Progress Notes (Addendum)
Subjective: Patient with trach.  SOme labored breathing.   Objective: Filed Vitals:   12/07/12 2032 12/08/12 0000 12/08/12 0017 12/08/12 0426  BP: 116/66 133/67 122/54 137/81  Pulse: 65 78 82 81  Temp:   97.9 F (36.6 C) 97.6 F (36.4 C)  TempSrc:   Axillary Axillary  Resp: 24 14 28 22   Height:      Weight:      SpO2: 99% 97% 95% 97%   Weight change:   Intake/Output Summary (Last 24 hours) at 12/08/12 0757 Last data filed at 12/08/12 0600  Gross per 24 hour  Intake   2125 ml  Output    325 ml  Net   1800 ml   Net I/O + 4.3 L  General: On vent  Labored breathing. Neck:  Unable to assess JVP Heart: Regular rate and rhythm, without murmurs, rubs, gallops.  Lungs: Bilateral rhonchi.   Exemities:  No edema.  .  Tele:  Afib 80s.  Lab Results: Results for orders placed during the hospital encounter of 11/09/12 (from the past 24 hour(s))  GLUCOSE, CAPILLARY     Status: Abnormal   Collection Time    12/07/12 12:12 PM      Result Value Range   Glucose-Capillary 297 (*) 70 - 99 mg/dL  GLUCOSE, CAPILLARY     Status: Abnormal   Collection Time    12/07/12  3:49 PM      Result Value Range   Glucose-Capillary 247 (*) 70 - 99 mg/dL  GLUCOSE, CAPILLARY     Status: Abnormal   Collection Time    12/07/12  8:02 PM      Result Value Range   Glucose-Capillary 190 (*) 70 - 99 mg/dL  GLUCOSE, CAPILLARY     Status: Abnormal   Collection Time    12/07/12 11:58 PM      Result Value Range   Glucose-Capillary 181 (*) 70 - 99 mg/dL  GLUCOSE, CAPILLARY     Status: Abnormal   Collection Time    12/08/12  4:34 AM      Result Value Range   Glucose-Capillary 202 (*) 70 - 99 mg/dL  PROTIME-INR     Status: Abnormal   Collection Time    12/08/12  5:00 AM      Result Value Range   Prothrombin Time 31.0 (*) 11.6 - 15.2 seconds   INR 3.12 (*) 0.00 - 1.49  CBC     Status: Abnormal (Preliminary result)   Collection Time    12/08/12  5:00 AM      Result Value Range   WBC PENDING  4.0 -  10.5 K/uL   RBC 3.11 (*) 3.87 - 5.11 MIL/uL   Hemoglobin 8.2 (*) 12.0 - 15.0 g/dL   HCT 16.1 (*) 09.6 - 04.5 %   MCV 83.3  78.0 - 100.0 fL   MCH 26.4  26.0 - 34.0 pg   MCHC 31.7  30.0 - 36.0 g/dL   RDW 40.9 (*) 81.1 - 91.4 %   Platelets PENDING  150 - 400 K/uL    Studies/Results: @RISRSLT24 @  Medications: Reviewed}   @PROBHOSP @  Patient remains critically ill  1.  Acute on chronic diastolic CHF  Volume is up on exam  Patient with TF and 1.2 L free H20 per day I would give 60 lasix x 1 and follow response, labs  2.  HOCM  Follow with diuressis  3.  Afib  Rates controlled  INR is therapeutic.    4.  Renal insuffi  Follow    LOS: 29 days   Dietrich Pates 12/08/2012, 7:57 AM

## 2012-12-09 ENCOUNTER — Inpatient Hospital Stay (HOSPITAL_COMMUNITY): Payer: Medicare Other

## 2012-12-09 DIAGNOSIS — E870 Hyperosmolality and hypernatremia: Secondary | ICD-10-CM

## 2012-12-09 LAB — CBC
HCT: 26.4 % — ABNORMAL LOW (ref 36.0–46.0)
MCV: 83.3 fL (ref 78.0–100.0)
RBC: 3.17 MIL/uL — ABNORMAL LOW (ref 3.87–5.11)
WBC: 16.9 10*3/uL — ABNORMAL HIGH (ref 4.0–10.5)

## 2012-12-09 LAB — BASIC METABOLIC PANEL
BUN: 62 mg/dL — ABNORMAL HIGH (ref 6–23)
CO2: 22 mEq/L (ref 19–32)
Chloride: 115 mEq/L — ABNORMAL HIGH (ref 96–112)
Creatinine, Ser: 2.32 mg/dL — ABNORMAL HIGH (ref 0.50–1.10)

## 2012-12-09 LAB — GLUCOSE, CAPILLARY
Glucose-Capillary: 353 mg/dL — ABNORMAL HIGH (ref 70–99)
Glucose-Capillary: 95 mg/dL (ref 70–99)
Glucose-Capillary: 226 mg/dL — ABNORMAL HIGH (ref 70–99)

## 2012-12-09 MED ORDER — FENTANYL CITRATE 0.05 MG/ML IJ SOLN
INTRAMUSCULAR | Status: AC
  Filled 2012-12-09: qty 2

## 2012-12-09 MED ORDER — WARFARIN SODIUM 2.5 MG PO TABS
2.5000 mg | ORAL_TABLET | Freq: Every day | ORAL | Status: DC
  Administered 2012-12-09 – 2012-12-11 (×3): 2.5 mg via ORAL
  Filled 2012-12-09 (×4): qty 1

## 2012-12-09 MED ORDER — FREE WATER
300.0000 mL | Status: DC
  Administered 2012-12-09 – 2012-12-11 (×11): 300 mL

## 2012-12-09 MED ORDER — MIDAZOLAM HCL 2 MG/2ML IJ SOLN
1.0000 mg | Freq: Once | INTRAMUSCULAR | Status: AC
  Administered 2012-12-09: 1 mg via INTRAVENOUS

## 2012-12-09 MED ORDER — FUROSEMIDE 10 MG/ML IJ SOLN
60.0000 mg | Freq: Once | INTRAMUSCULAR | Status: AC
  Administered 2012-12-09: 60 mg via INTRAVENOUS

## 2012-12-09 MED ORDER — FENTANYL CITRATE 0.05 MG/ML IJ SOLN
50.0000 ug | Freq: Once | INTRAMUSCULAR | Status: AC
  Administered 2012-12-09: 50 ug via INTRAVENOUS

## 2012-12-09 MED ORDER — FUROSEMIDE 10 MG/ML IJ SOLN
INTRAMUSCULAR | Status: AC
  Filled 2012-12-09: qty 8

## 2012-12-09 NOTE — Progress Notes (Addendum)
Pt respirations appeared labored, and pt appeared to be gasping for air.  Dr. Sherene Sires was called, MD had Anders Simmonds come to eval pt.  Anders Simmonds placed a #6.0 shiley cuffed trach for vent placement.  Pt placed on vent settings per CCM. Pt tol vent settings well, no respiratory distress noted once pt placed on vent.  Sat 100%, rr 21, hr 68. Per RN conversation w/  Dr. Sherene Sires, no need for ABG presently.

## 2012-12-09 NOTE — Progress Notes (Signed)
Dr. Sherene Sires paged regarding pt having more labored breathing as compared to this morning- MD aware of AM labored breathing.  Lungs clear.  Ordered received to place pt back on vent.  Anders Simmonds, NP consulted to perform trach change.  Will continue to monitor.  Salomon Mast, RN

## 2012-12-09 NOTE — Progress Notes (Signed)
ANTICOAGULATION CONSULT NOTE - Follow Up Consult  Pharmacy Consult for Warfarin Indication: atrial fibrillation  No Known Allergies  Labs:  Recent Labs  12/07/12 0500 12/08/12 0500 12/08/12 1200 12/09/12 0500  HGB 9.0* 8.2*  --  8.5*  HCT 27.9* 25.9*  --  26.4*  PLT 175 185  --  212  LABPROT 29.8* 31.0*  --  26.9*  INR 2.96* 3.12*  --  2.59*  CREATININE 1.92* 2.08* 2.04* 2.32*    Estimated Creatinine Clearance: 21.5 ml/min (by C-G formula based on Cr of 2.32).   Assessment: 72 y.o. F who continues on warfarin for anticoagulation with hx Afib. INR this morning is therapeutic(  Goal of Therapy:  INR 2-3   Plan:  1. Warfarin 2.5 mg daily 2. Will continue to monitor INR trend, may need to decrease dose in AM  Thank you. Okey Regal, PharmD [phone]  12/09/2012 8:54 AM

## 2012-12-09 NOTE — Progress Notes (Signed)
PULMONARY  / CRITICAL CARE MEDICINE  Name: [name] MRN: [id number] DOB: 11-26-1940    ADMISSION DATE:  11/09/2012 CONSULTATION DATE:  11/09/2012  REFERRING MD :  Preston Fleeting PRIMARY SERVICE: PCCM  CHIEF COMPLAINT:  Shortness of breath.  BRIEF PATIENT DESCRIPTION: 72 y/o female with CHF was admitted on 7/3 from the [facility] ED with acute hypoxemic respiratory failure due to a CHF exacerbation, ?Superimposed PNA.  LINES / TUBES: 7/3 ETT >>11/13/12, 11/13/12  (failed extubation immediately, ? aspirated) >> 7/17 7/4 CVC R IJ >> 7/17 7/5 A-line >> 7/18 7/17 Trach (DF) >> downsized to #4 8/1  7/17 Rt PICC >>  7/22 PEG >>>  CULTURES: 7/4 U strep >> neg 7/4 legionella >> neg 7/3 mrsa PCR >> POSITIVE 7/8 mini-BAL >> yeast 7/16 sputum >> yeast  7/16 blood >> neg 7/16 urine >> yeast 50 K colonies 7/26 C. Diff>>>neg  ANTIBIOTICS: 7/3 Ceftriaxone >>7/6 7/4 levofloxacin >>7/7 7/4 Vancomycin >>7/6, restarted 7/8 >> 7/18 7/7 Unasyn >> 7/16 7/16 cefepime >> 7/24  SIGNIFICANT EVENTS: 7/3 admission 7/07 reintubated immediately after extubation.  ? Aspiration. ABX restarted.  on Neo-Synephrine.  7/09 Able to wean fio2 and neo. Lopressor required for rate control of afib. 7/10 Off neo, but now tachycardic.  Failed SBT this AM, but RT felt it was due to sedation.  Sedation weaned 7/14 Hypotensive, vomiting, amiodarone turned off for low heart rate 7/16 Change to pressure control 7/17 Start pressors, PRBC transfusion, trach 7/18 Off pressors, PRBC transfusion 7/28- 10-12 hrs trach collar trial 7/29- recs from surgery for pancreatic lesions obtained  STUDIES: 7/16 CT chest >> emphysema, b/l ASD, small effusions 7/16 CT abd/pelvis >> 2.6 x 2 cm nodule in head of pancreas, bone changes suggestive of Paget's disease  SUBJECTIVE:   Increased wob but nods "ok"  VITAL SIGNS: Temp:  [97.8 F (36.6 C)-99.5 F (37.5 C)] 98.4 F (36.9 C) (08/02 0800) Pulse Rate:  [76-94] 84 (08/02 0822) Resp:   [24-33] 27 (08/02 0822) BP: (113-150)/(54-84) 128/78 mmHg (08/02 0822) SpO2:  [93 %-100 %] 96 % (08/02 0822) FiO2 (%):  [28 %] 28 % (08/02 0800)  HEMODYNAMICS: CVP:  [6 mmHg-21 mmHg] 10 mmHg  VENTILATOR SETTINGS: Vent Mode:  [-]  FiO2 (%):  [28 %] 28 %  INTAKE / OUTPUT: Intake/Output     08/01 0701 - 08/02 0700 08/02 0701 - 08/03 0700   I.V. (mL/kg) 700 (8.7) 60 (0.7)   NG/GT 1625 110   Total Intake(mL/kg) 2325 (28.9) 170 (2.1)   Urine (mL/kg/hr) 65 (0)    Total Output 65     Net +2260 +170        Urine Occurrence 4 x    Stool Occurrence 1 x     PHYSICAL EXAMINATION: Gen: Lying  in bed, in NAD on trach collar mod increase wob HEENT: PEG in place, trach site clean PULM: cta CV: irregular, no m/r/g AB: soft, non tender, BS+/ Paradoxical abd movement Ext: no edema Neuro: RASS 0, follows commands, more interactive  LABS: CBC Recent Labs     12/07/12  0500  12/08/12  0500  12/09/12  0500  WBC  17.6*  18.4*  16.9*  HGB  9.0*  8.2*  8.5*  HCT  27.9*  25.9*  26.4*  PLT  175  185  212   Coag's Recent Labs     12/07/12  0500  12/08/12  0500  12/09/12  0500  INR  2.96*  3.12*  2.59*   BMET  Recent Labs     12/08/12  0500  12/08/12  1200  12/09/12  0500  NA  146*  147*  149*  K  4.5  5.4*  3.7  CL  117*  118*  115*  CO2  18*  20  22  BUN  69*  65*  62*  CREATININE  2.08*  2.04*  2.32*  GLUCOSE  320*  180*  121*   Electrolytes Recent Labs     12/08/12  0500  12/08/12  1200  12/09/12  0500  CALCIUM  12.5*  13.0*  12.6*   Glucose Recent Labs     12/08/12  1212  12/08/12  1617  12/08/12  2018  12/08/12  2358  12/09/12  0426  12/09/12  0750  GLUCAP  164*  177*  244*  353*  95  162*    CXR: 7/30 - no significant change - continues to to have mild interstitial densities bil  ASSESSMENT / PLAN:  PULMONARY A:  Acute respiratory failure 2nd to pulmonary infiltrates, resolving. effusions P:   - continue wean on TC 28%, at goal - PRN BD's. -  control rate to avoid diastolic CHF - even balance goal to slight positive - since trach change 8/1 more labored  CARDIOVASCULAR A:  HOCM with acute on chronic diastolic heart failure. A fib with RVR >> amiodarone d/c due to bradycardia, hypotension. Hx of CAD, HTN, hyperlipidemia. Shock >> resolved P:  - Anticoagulation managed by Cards. coumadin - Continue Cardizem PO for rate control 30 q6 hours.  RENAL Lab Results  Component Value Date   NA 149* 12/09/2012   NA 147* 12/08/2012   NA 146* 12/08/2012    A: Acute kidney injury >> renal fx stable. Hypernatremia  Worse, increase water rx Hypokalemia - resolved Hypercalcemia  Hyperphosphatemia Mag WNL P:   -allow pos balance -maintain free water  -D5W increased to 75 cc/h 8/2   GASTROINTESTINAL A:   Protein malnutrition Vomiting - resolved Mass head of pancreas-- Will need further assessment of pancreatic lesion when more stable, likely in LTAC or outpatient, Ca 19-9 elevated (73.1)- concern cancer P:   - PEG tube  - Pepcid for SUP - gen sx consulted, will order MRCP when more confident for safety on trach collar flat in MRI machine -will d/w GI for [place] endoscopic plans in future, LFT noted wnl except albumin low -  repeat swallow post downsize 4 cuffless if tolerates smaller tube  GYN A:  Uterine prolapse. P: - GYN consulted , prolapse per GYN.  HEMATOLOGIC  Recent Labs Lab 12/07/12 0500 12/08/12 0500 12/09/12 0500  HGB 9.0* 8.2* 8.5*    A:  Anemia of chronic disease and critical illness >> PRBC transfusion 7/17, 7/18. Thrombocytopenia  hemoconcentration 8/1 Leukocytosis - WBC 17.6 on 7/31, no fever, most likely due to hemoconcentration  P:  - Monitor CBC  - Continue coumadin  INFECTIOUS A: Initial concern for aspiration pneumonia >> has persistent pulmonary infiltrates, and increasing WBC. P:   -no abx, follow fever curve  ENDOCRINE A:  DM2, uncontrolled since 7/31 P:   - SSI -lantus to be  increased if needing more SSI   NEUROLOGIC A: Acute encephalopathy 2nd to hypoxia/hypercapnia. Muscular deconditioning. P:   - PT/OT, progress, especially as neuro better and trach collar successful    Today's summary: consider change to cuffless 4, mrcp Consider  call GI for planned endo [place] as outpt only if she is markedly improved as may not  be a candidate for any bx's much less rx if this is pancreatic ca     Sandrea Hughs, MD Pulmonary and Critical Care Medicine [facility] Cell [phone] After 5:30 PM or weekends, call [phone]

## 2012-12-09 NOTE — Progress Notes (Signed)
CSW received call from Raquel at [facility] care center. Tedd Sias never gave [place]. Patient can not go until Monday.  Nataniel Gasper C. Mostafa Yuan MSW, LCSW [phone]

## 2012-12-09 NOTE — Progress Notes (Signed)
Subjective:  Breathing appears mildly labored in getting ready to go back on the respirator. Unable to respond to questions.  Objective:  Vital Signs in the last 24 hours: BP 135/66  Pulse 66  Temp(Src) 98.4 F (36.9 C) (Axillary)  Resp 24  Ht 5\' 2"  (1.575 m)  Wt 80.4 kg (177 lb 4 oz)  BMI 32.41 kg/m2  SpO2 99%  Physical Exam: Elderly black female with tracheostomy in mild respiratory distress Lungs:  Reduced breath sounds Cardiac: Irregular r rhythm, normal S1 and S2, no S3, 2/6 systolic murmur Abdomen:  Soft, nontender, no masses Extremities:  No edema present  Intake/Output from previous day: 08/01 0701 - 08/02 0700 In: 2325 [I.V.:700; NG/GT:1625] Out: 65 [Urine:65] Weight Filed Weights   12/05/12 0446 12/06/12 0411 12/06/12 0444  Weight: 81 kg (178 lb 9.2 oz) 81 kg (178 lb 9.2 oz) 80.4 kg (177 lb 4 oz)    Lab Results: Basic Metabolic Panel:  Recent Labs  96/04/54 1200 12/09/12 0500  NA 147* 149*  K 5.4* 3.7  CL 118* 115*  CO2 20 22  GLUCOSE 180* 121*  BUN 65* 62*  CREATININE 2.04* 2.32*    CBC:  Recent Labs  12/08/12 0500 12/09/12 0500  WBC 18.4* 16.9*  HGB 8.2* 8.5*  HCT 25.9* 26.4*  MCV 83.3 83.3  PLT 185 212    BNP    Component Value Date/Time   PROBNP 33388.0* 12/05/2012 0500    PROTIME: Lab Results  Component Value Date   INR 2.59* 12/09/2012   INR 3.12* 12/08/2012   INR 2.96* 12/07/2012    Telemetry: Atrial fib with controlled response  Assessment/Plan: 1. Acute on chronic diastolic congestive heart failure 2. Chronic kidney disease stage IV 3. Atrial fibrillation chronic 4. Hypertrophic cardiomyopathy  Recommendations:  Watch respiratory status. Additional Lasix today.     Darden Palmer  MD [facility]  12/09/2012, 12:01 PM

## 2012-12-09 NOTE — Progress Notes (Signed)
Pt noted to be bradying down into the 40s, one time as low as 38.  BP also lower at 105/60 taken at 1417 (the 1400 reading was 77/51).  Pt arousable.  Dr. Sherene Sires notified.  No new orders received.  Will continue to monitor.  Salomon Mast, RN

## 2012-12-10 LAB — BASIC METABOLIC PANEL
BUN: 65 mg/dL — ABNORMAL HIGH (ref 6–23)
Chloride: 106 mEq/L (ref 96–112)
Creatinine, Ser: 2.48 mg/dL — ABNORMAL HIGH (ref 0.50–1.10)
GFR calc Af Amer: 21 mL/min — ABNORMAL LOW (ref >90)
GFR calc non Af Amer: 18 mL/min — ABNORMAL LOW (ref >90)

## 2012-12-10 LAB — GLUCOSE, CAPILLARY
Glucose-Capillary: 183 mg/dL — ABNORMAL HIGH (ref 70–99)
Glucose-Capillary: 148 mg/dL — ABNORMAL HIGH (ref 70–99)
Glucose-Capillary: 310 mg/dL — ABNORMAL HIGH (ref 70–99)
Glucose-Capillary: 254 mg/dL — ABNORMAL HIGH (ref 70–99)

## 2012-12-10 LAB — PROTIME-INR: Prothrombin Time: 24.8 seconds — ABNORMAL HIGH (ref 11.6–15.2)

## 2012-12-10 NOTE — Progress Notes (Signed)
ANTICOAGULATION CONSULT NOTE - Follow Up Consult  Pharmacy Consult for Warfarin Indication: atrial fibrillation  No Known Allergies  Labs:  Recent Labs  12/08/12 0500 12/08/12 1200 12/09/12 0500 12/10/12 0500  HGB 8.2*  --  8.5*  --   HCT 25.9*  --  26.4*  --   PLT 185  --  212  --   LABPROT 31.0*  --  26.9* 24.8*  INR 3.12*  --  2.59* 2.33*  CREATININE 2.08* 2.04* 2.32* 2.48*    Estimated Creatinine Clearance: 18.9 ml/min (by C-G formula based on Cr of 2.48).   Assessment: 72 y.o. F who continues on warfarin for anticoagulation with hx Afib. INR this morning is therapeutic  Goal of Therapy:  INR 2-3   Plan:  1. Warfarin 2.5 mg daily 2. Will continue to monitor INR trend  Thank you. Okey Regal, PharmD [phone]  12/10/2012 9:30 AM

## 2012-12-10 NOTE — Progress Notes (Signed)
Paged Dr. De Burrs regarding pt having a moment of emesis r/t activation of her gag reflex while performing mouthcare/suctioning.  Pt suctioned orally.  Inline suctioning performed, no vomit noted.  Also changed inner cannula and noted no vomit.  Gave pt 4mg  zofran IV.  No new orders received.  Will continue to monitor.  Salomon Mast, RN

## 2012-12-10 NOTE — Progress Notes (Signed)
Subjective:  Back on ventilator and sleeping.  Had mild bradycardia yesterday around time of ventilator reinitiation.  Objective:  Vital Signs in the last 24 hours: BP 104/60  Pulse 63  Temp(Src) 98 F (36.7 C) (Axillary)  Resp 17  Ht 5\' 2"  (1.575 m)  Wt 71 kg (156 lb 8.4 oz)  BMI 28.62 kg/m2  SpO2 100%  Physical Exam: Elderly black female with tracheostomy on vent in NAD sleeping Lungs:  Reduced breath sounds Cardiac: Irregular r rhythm, normal S1 and S2, no S3, 2/6 systolic murmur Abdomen:  Soft, nontender, no masses Extremities:  No edema present  Intake/Output from previous day: 08/02 0701 - 08/03 0700 In: 4559.5 [I.V.:1684.5; NG/GT:2875] Out: 20 [Urine:20] Weight Filed Weights   12/06/12 0444 12/10/12 0329 12/10/12 0742  Weight: 80.4 kg (177 lb 4 oz) 76 kg (167 lb 8.8 oz) 71 kg (156 lb 8.4 oz)    Lab Results: Basic Metabolic Panel:  Recent Labs  40/98/11 1200 12/09/12 0500  NA 147* 149*  K 5.4* 3.7  CL 118* 115*  CO2 20 22  GLUCOSE 180* 121*  BUN 65* 62*  CREATININE 2.04* 2.32*    CBC:  Recent Labs  12/08/12 0500 12/09/12 0500  WBC 18.4* 16.9*  HGB 8.2* 8.5*  HCT 25.9* 26.4*  MCV 83.3 83.3  PLT 185 212    BNP    Component Value Date/Time   PROBNP 33388.0* 12/05/2012 0500    PROTIME: Lab Results  Component Value Date   INR 2.33* 12/10/2012   INR 2.59* 12/09/2012   INR 3.12* 12/08/2012    Telemetry: Atrial fib with controlled response  Assessment/Plan: 1. Acute on chronic diastolic congestive heart failure 2. Chronic kidney disease stage IV worsened 3. Atrial fibrillation chronic 4. Hypertrophic cardiomyopathy  Recommendations:  Hold furosemide with rising creatinine and lower BP     W. Ashley Royalty.  MD [facility]  12/10/2012, 8:31 AM

## 2012-12-10 NOTE — Progress Notes (Addendum)
PULMONARY  / CRITICAL CARE MEDICINE  Name: [name] MRN: [id number] DOB: 11-17-1940    ADMISSION DATE:  11/09/2012 CONSULTATION DATE:  11/09/2012  REFERRING MD :  Preston Fleeting PRIMARY SERVICE: PCCM  CHIEF COMPLAINT:  Shortness of breath.  BRIEF PATIENT DESCRIPTION: 72 y/o female with CHF was admitted on 7/3 from the [facility] ED with acute hypoxemic respiratory failure due to a CHF exacerbation, ?Superimposed PNA.  LINES / TUBES: 7/3 ETT >>11/13/12, 11/13/12  (failed extubation immediately, ? aspirated) >> 7/17 7/4 CVC R IJ >> 7/17 7/5 A-line >> 7/18 7/17 Trach (DF) >> downsized to #4 8/1 > replaced with # 6 cuffed 8/2 for resp distrss 7/17 Rt PICC >>  7/22 PEG >>>  CULTURES: 7/4 U strep >> neg 7/4 legionella >> neg 7/3 mrsa PCR >> POSITIVE 7/8 mini-BAL >> yeast 7/16 sputum >> yeast  7/16 blood >> neg 7/16 urine >> yeast 50 K colonies 7/26 C. Diff>>>neg  ANTIBIOTICS: 7/3 Ceftriaxone >>7/6 7/4 levofloxacin >>7/7 7/4 Vancomycin >>7/6, restarted 7/8 >> 7/18 7/7 Unasyn >> 7/16 7/16 cefepime >> 7/24  SIGNIFICANT EVENTS: 7/3 admission 7/07 reintubated immediately after extubation.  ? Aspiration. ABX restarted.  on Neo-Synephrine.  7/09 Able to wean fio2 and neo. Lopressor required for rate control of afib. 7/10 Off neo, but now tachycardic.  Failed SBT this AM, but RT felt it was due to sedation.  Sedation weaned 7/14 Hypotensive, vomiting, amiodarone turned off for low heart rate 7/16 Change to pressure control 7/17 Start pressors, PRBC transfusion, trach 7/18 Off pressors, PRBC transfusion 7/28- 10-12 hrs trach collar trial 7/29- recs from surgery for pancreatic lesions obtained 8/2 back on vent   STUDIES: 7/16 CT chest >> emphysema, b/l ASD, small effusions 7/16 CT abd/pelvis >> 2.6 x 2 cm nodule in head of pancreas, bone changes suggestive of Paget's disease  SUBJECTIVE:   Weaning again this am T bar but breathing is again paradoxic  VITAL SIGNS: Temp:  [97.6 F (36.4  C)-98.3 F (36.8 C)] 98 F (36.7 C) (08/03 0733) Pulse Rate:  [63-76] 64 (08/03 0900) Resp:  [17-28] 19 (08/03 0900) BP: (75-121)/(46-94) 104/60 mmHg (08/03 0733) SpO2:  [100 %] 100 % (08/03 0900) FiO2 (%):  [28 %-50 %] 35 % (08/03 0900) Weight:  [156 lb 8.4 oz (71 kg)-167 lb 8.8 oz (76 kg)] 156 lb 8.4 oz (71 kg) (08/03 0742)  HEMODYNAMICS:    VENTILATOR SETTINGS: Vent Mode:  [-] PRVC FiO2 (%):  [28 %-50 %] 35 % Set Rate:  [16 bmp] 16 bmp Vt Set:  [500 mL] 500 mL PEEP:  [5 cmH20] 5 cmH20 Plateau Pressure:  [16 cmH20-22 cmH20] 16 cmH20  INTAKE / OUTPUT: Intake/Output     08/02 0701 - 08/03 0700 08/03 0701 - 08/04 0700   I.V. (mL/kg) 1684.5 (22.2) 300 (4.2)   NG/GT 2875 220   Total Intake(mL/kg) 4559.5 (60) 520 (7.3)   Urine (mL/kg/hr) 20 (0)    Total Output 20     Net +4539.5 +520        Urine Occurrence 8 x 1 x   Stool Occurrence 8 x 2 x    PHYSICAL EXAMINATION: Gen: Lying  in bed on trach collar mod increase wob HEENT: PEG in place, trach site clean PULM: cta CV: irregular, no m/r/g AB: soft, non tender, BS+/ Paradoxical abd movement Ext: no edema Neuro: RASS 0, follows commands, more interactive  LABS: CBC Recent Labs     12/08/12  0500  12/09/12  0500  WBC  18.4*  16.9*  HGB  8.2*  8.5*  HCT  25.9*  26.4*  PLT  185  212   Coag's Recent Labs     12/08/12  0500  12/09/12  0500  12/10/12  0500  INR  3.12*  2.59*  2.33*   BMET Recent Labs     12/08/12  1200  12/09/12  0500  12/10/12  0500  NA  147*  149*  140  K  5.4*  3.7  3.8  CL  118*  115*  106  CO2  20  22  24   BUN  65*  62*  65*  CREATININE  2.04*  2.32*  2.48*  GLUCOSE  180*  121*  151*   Electrolytes Recent Labs     12/08/12  1200  12/09/12  0500  12/10/12  0500  CALCIUM  13.0*  12.6*  11.2*   Glucose Recent Labs     12/09/12  1238  12/09/12  1619  12/09/12  1940  12/10/12  0020  12/10/12  0334  12/10/12  0813  GLUCAP  384*  226*  157*  209*  183*  148*    CXR:8/2   Tracheostomy terminates at/just above the thoracic inlet.  Otherwise unchanged patchy RUL and LLL opacities    ASSESSMENT / PLAN:  PULMONARY A:  Acute respiratory failure 2nd to pulmonary infiltrates, resolving. effusions P:   - continue wean on TC as tolerates but ideally will need to be up in chair to "unload" the diaphragm  - PRN BD's. - control rate to avoid diastolic CHF     CARDIOVASCULAR A:  HOCM with acute on chronic diastolic heart failure. A fib with RVR >> amiodarone d/c due to bradycardia, hypotension. Hx of CAD, HTN, hyperlipidemia. Shock >> resolved P:  - Anticoagulation managed by Cards. coumadin - Continue Cardizem PO for rate control @ 30 q6 hours.  RENAL Lab Results  Component Value Date   NA 140 12/10/2012   NA 149* 12/09/2012   NA 147* 12/08/2012    A: Acute kidney injury >> renal fx stable. Hypernatremia  Resolved with ncrease water rx Hypokalemia - resolved Hypercalcemia improving Hyperphosphatemia resolved Mag WNL P:   -maintain free water at 300 q4 -D5W decreased to kvo 8/3    GASTROINTESTINAL A:   Protein malnutrition Vomiting - resolved Mass head of pancreas-- Will need further assessment of pancreatic lesion when more stable, likely in LTAC or outpatient, Ca 19-9 elevated (73.1)- concern cancer P:   - PEG tube  - Pepcid for SUP - gen sx consulted, will order MRCP when more confident for safety on trach collar flat in MRI machine -will d/w GI for [place] endoscopic plans in future, LFT noted wnl except albumin low    GYN A:  Uterine prolapse. P: - GYN consulted 7/18 >  Dx is uterine prolapse  HEMATOLOGIC  Recent Labs Lab 12/07/12 0500 12/08/12 0500 12/09/12 0500  HGB 9.0* 8.2* 8.5*    A:  Anemia of chronic disease and critical illness >> PRBC transfusion 7/17, 7/18. Thrombocytopenia  hemoconcentration 8/1 Leukocytosis - WBC 17.6 on 7/31, no fever, most likely due to hemoconcentration  P:  - Monitor CBC  - Continue  coumadin  INFECTIOUS A: Initial concern for aspiration pneumonia >> has persistent pulmonary infiltrates, and increasing WBC. P:   -no abx, follow fever curve  ENDOCRINE A:  DM2, uncontrolled since 7/31 P:   - SSI -lantus to be increased if needing more SSI  NEUROLOGIC A: Acute encephalopathy 2nd to hypoxia/hypercapnia. Muscular deconditioning. P:   - PT/OT, progress, especially as neuro better     Today's summary:  -Better back on Vent but abd paradox with wean so rec to RT not to push too hard with weaning -Consider  call GI for planned endo [place] as outpt only if she is markedly improved as may not be a candidate for any bx's much less rx if this is pancreatic ca     Sandrea Hughs, MD Pulmonary and Critical Care Medicine [facility] Cell [phone] After 5:30 PM or weekends, call [phone]

## 2012-12-10 NOTE — Progress Notes (Signed)
Pt is much more awake and alert this morning.  No resp distress noted.  Placed pt on ATC for SBT, pt tol well, no resp distress noted.  Sats 100% on 35% ATC.  RR 22.  Pt denies SOB.

## 2012-12-10 NOTE — Progress Notes (Signed)
Pt w/ slightly increased WOB, placed pt back on vent.  A few minutes later, while myself and RN was in room, pt began to vomit.  Oral suction performed, and trach suctioned.  There did not appear to be vomit suctioned from trach at this time.  RN changed inner cannula and did not note vomit at that time. Sats 99% on 35% vent.

## 2012-12-10 NOTE — Progress Notes (Signed)
Paged Dr. De Burrs regarding pt bradycardic in the 50s, aflutter.  Due to give cardizem and digoxin.  Ok to hold cardizem per parameters, ok to give digoxin.  Will continue to monitor.  Salomon Mast, RN

## 2012-12-11 LAB — BASIC METABOLIC PANEL
CO2: 24 mEq/L (ref 19–32)
Calcium: 11.2 mg/dL — ABNORMAL HIGH (ref 8.4–10.5)
Creatinine, Ser: 2.41 mg/dL — ABNORMAL HIGH (ref 0.50–1.10)
GFR calc non Af Amer: 19 mL/min — ABNORMAL LOW (ref >90)
Glucose, Bld: 158 mg/dL — ABNORMAL HIGH (ref 70–99)
Sodium: 134 mEq/L — ABNORMAL LOW (ref 135–145)

## 2012-12-11 LAB — GLUCOSE, CAPILLARY
Glucose-Capillary: 231 mg/dL — ABNORMAL HIGH (ref 70–99)
Glucose-Capillary: 233 mg/dL — ABNORMAL HIGH (ref 70–99)
Glucose-Capillary: 294 mg/dL — ABNORMAL HIGH (ref 70–99)

## 2012-12-11 LAB — CBC
MCH: 27.2 pg (ref 26.0–34.0)
MCV: 83.1 fL (ref 78.0–100.0)
Platelets: 194 10*3/uL (ref 150–400)
RBC: 2.72 MIL/uL — ABNORMAL LOW (ref 3.87–5.11)
RDW: 17.9 % — ABNORMAL HIGH (ref 11.5–15.5)
WBC: 11.3 10*3/uL — ABNORMAL HIGH (ref 4.0–10.5)

## 2012-12-11 LAB — PROTIME-INR: INR: 2.02 — ABNORMAL HIGH (ref 0.00–1.49)

## 2012-12-11 MED ORDER — FREE WATER
250.0000 mL | Freq: Four times a day (QID) | Status: DC
  Administered 2012-12-11 – 2012-12-20 (×36): 250 mL

## 2012-12-11 NOTE — Clinical Social Work Note (Signed)
CSW met with pt at bedside along with pt daughter, [place] and RNCM.  CSW and RNCM spoke to pt and dtr regarding vent SNF possibility and the possibility of pt residing in [place] for vent SNF.  Pt daughter agreeable to this disposition.    Pt would not be appropriate for Vent SNF unless pt medically needed to be placed back on vent.    CSW reviewed the process with the daughter and [place].  The family would also need to provide 3 months of bank statements along with the application for review prior to being accepted to the vent SNF.  Pt daughter is aware and agreeable.  Daughters' information:   [employer] [phone] [name] [phone] Gabriel Rung [phone] Velna Hatchet [phone]

## 2012-12-11 NOTE — Progress Notes (Signed)
Inpatient Diabetes Program Recommendations  AACE/ADA: New Consensus Statement on Inpatient Glycemic Control (2013)  Target Ranges:  Prepandial:   less than 140 mg/dL      Peak postprandial:   less than 180 mg/dL (1-2 hours)      Critically ill patients:  140 - 180 mg/dL   Hyperglycemia  Inpatient Diabetes Program Recommendations Insulin - Basal: xxxxx Correction (SSI): xxx Insulin - Meal Coverage: xxxxx  Please initiate ICU Hyperglycemia protocol.  Glucose primarily in 200 range.  Thank you, Lenor Coffin, RN, CNS, Diabetes Coordinator [phone])

## 2012-12-11 NOTE — Progress Notes (Signed)
PULMONARY  / CRITICAL CARE MEDICINE  Name: [name] MRN: [id number] DOB: [birth year]    ADMISSION DATE:  11/09/2012 CONSULTATION DATE:  11/09/2012  REFERRING MD :  Preston Fleeting PRIMARY SERVICE: PCCM  CHIEF COMPLAINT:  Shortness of breath.  BRIEF PATIENT DESCRIPTION: 72 y/o female with CHF was admitted on 7/3 from the [facility] ED with acute hypoxemic respiratory failure due to a CHF exacerbation, ?Superimposed PNA.  LINES / TUBES: 7/3 ETT >>11/13/12, 11/13/12  (failed extubation immediately, ? aspirated) >> 7/17 7/4 CVC R IJ >> 7/17 7/5 A-line >> 7/18 7/17 Trach (DF) >> downsized to #4 8/1 > replaced with # 6 cuffed 8/2 for resp distrss 7/17 Rt PICC >>  7/22 PEG >>>  CULTURES: 7/4 U strep >> neg 7/4 legionella >> neg 7/3 mrsa PCR >> POSITIVE 7/8 mini-BAL >> yeast 7/16 sputum >> yeast  7/16 blood >> neg 7/16 urine >> yeast 50 K colonies 7/26 C. Diff>>>neg  ANTIBIOTICS: 7/3 Ceftriaxone >>7/6 7/4 levofloxacin >>7/7 7/4 Vancomycin >>7/6, restarted 7/8 >> 7/18 7/7 Unasyn >> 7/16 7/16 cefepime >> 7/24  SIGNIFICANT EVENTS: 7/3 admission 7/07 reintubated immediately after extubation.  ? Aspiration. ABX restarted.  on Neo-Synephrine.  7/09 Able to wean fio2 and neo. Lopressor required for rate control of afib. 7/10 Off neo, but now tachycardic.  Failed SBT this AM, but RT felt it was due to sedation.  Sedation weaned 7/14 Hypotensive, vomiting, amiodarone turned off for low heart rate 7/16 Change to pressure control 7/17 Start pressors, PRBC transfusion, trach 7/18 Off pressors, PRBC transfusion 7/28- 10-12 hrs trach collar trial 7/29- recs from surgery for pancreatic lesions obtained 8/2 back on vent   STUDIES: 7/16 CT chest >> emphysema, b/l ASD, small effusions 7/16 CT abd/pelvis >> 2.6 x 2 cm nodule in head of pancreas, bone changes suggestive of Paget's disease  SUBJECTIVE:   Weaning again this am T bar but breathing is again paradoxic  VITAL SIGNS: Temp:  [97.8 F (36.6  C)-98.6 F (37 C)] 98.2 F (36.8 C) (08/04 0747) Pulse Rate:  [61-80] 75 (08/04 0747) Resp:  [17-32] 22 (08/04 0747) BP: (93-125)/(48-76) 114/69 mmHg (08/04 0747) SpO2:  [98 %-100 %] 98 % (08/04 0747) FiO2 (%):  [28 %-35 %] 28 % (08/04 0747)  HEMODYNAMICS:    VENTILATOR SETTINGS: Vent Mode:  [-] PRVC FiO2 (%):  [28 %-35 %] 28 % Set Rate:  [16 bmp] 16 bmp Vt Set:  [500 mL] 500 mL PEEP:  [5 cmH20] 5 cmH20 Plateau Pressure:  [13 cmH20-16 cmH20] 14 cmH20  INTAKE / OUTPUT: Intake/Output     08/03 0701 - 08/04 0700 08/04 0701 - 08/05 0700   I.V. (mL/kg) 771.7 (10.9)    NG/GT 2165    Total Intake(mL/kg) 2936.7 (41.4)    Urine (mL/kg/hr)     Total Output       Net +2936.7          Urine Occurrence 8 x    Stool Occurrence 8 x     PHYSICAL EXAMINATION: Gen: Lying  in bed on trach collar mod increase wob HEENT: PEG in place, trach site clean PULM: cta CV: irregular, no m/r/g AB: soft, non tender, BS+/ Paradoxical abd movement Ext: no edema Neuro: RASS 0, follows commands, more interactive  LABS: CBC Recent Labs     12/09/12  0500  12/11/12  0500  WBC  16.9*  11.3*  HGB  8.5*  7.4*  HCT  26.4*  22.6*  PLT  212  194  Coag's Recent Labs     12/09/12  0500  12/10/12  0500  12/11/12  0500  INR  2.59*  2.33*  2.02*   BMET Recent Labs     12/09/12  0500  12/10/12  0500  12/11/12  0500  NA  149*  140  134*  K  3.7  3.8  3.2*  CL  115*  106  100  CO2  22  24  24   BUN  62*  65*  64*  CREATININE  2.32*  2.48*  2.41*  GLUCOSE  121*  151*  158*   Electrolytes Recent Labs     12/09/12  0500  12/10/12  0500  12/11/12  0500  CALCIUM  12.6*  11.2*  11.2*   Glucose Recent Labs     12/10/12  1534  12/10/12  2003  12/10/12  2147  12/11/12  0004  12/11/12  0419  12/11/12  0731  GLUCAP  254*  109*  142*  168*  137*  134*    CXR:8/2  Tracheostomy terminates at/just above the thoracic inlet.  Otherwise unchanged patchy RUL and LLL opacities     ASSESSMENT / PLAN:  PULMONARY A:  Acute respiratory failure 2nd to pulmonary infiltrates, resolving. effusions P:   - Improvement after being back on vent, will attempt PS trials today. - PRN BD's. - Control rate to avoid diastolic CHF    CARDIOVASCULAR A:  HOCM with acute on chronic diastolic heart failure. A fib with RVR >> amiodarone d/c due to bradycardia, hypotension. Hx of CAD, HTN, hyperlipidemia. Shock >> resolved P:  - Anticoagulation managed by Cards. coumadin - Continue Cardizem PO for rate control @ 30 q6 hours.  RENAL Lab Results  Component Value Date   NA 134* 12/11/2012   NA 140 12/10/2012   NA 149* 12/09/2012    A: Acute kidney injury >> renal fx stable. Hypernatremia  Resolved with ncrease water rx Hypokalemia - resolved Hypercalcemia improving Hyperphosphatemia resolved Mag WNL P:   - Decrease free water to 250 q6. - D5W decreased to kvo 8/3 and will hold for now since Na is improved.   GASTROINTESTINAL A:   Protein malnutrition Vomiting - resolved Mass head of pancreas-- Will need further assessment of pancreatic lesion when more stable, likely in LTAC or outpatient, Ca 19-9 elevated (73.1)- concern cancer P:   - PEG tube. - Pepcid for SUP - Gen sx consulted, will order MRCP when more confident for safety on trach collar flat in MRI machine. - Will d/w GI for [place] endoscopic plans in future, LFT noted wnl except albumin low   GYN A:  Uterine prolapse. P: - GYN consulted 7/18 >  Dx is uterine prolapse  HEMATOLOGIC  Recent Labs Lab 12/08/12 0500 12/09/12 0500 12/11/12 0500  HGB 8.2* 8.5* 7.4*    A:  Anemia of chronic disease and critical illness >> PRBC transfusion 7/17, 7/18. Thrombocytopenia  hemoconcentration 8/1 Leukocytosis - WBC 17.6 on 7/31, no fever, most likely due to hemoconcentration  P:  - Monitor CBC  - Continue coumadin  INFECTIOUS A: Initial concern for aspiration pneumonia >> has persistent pulmonary  infiltrates, and increasing WBC. P:   - No abx, follow fever curve  ENDOCRINE A:  DM2, uncontrolled since 7/31 P:   - SSI - Lantus to be increased if needing more SSI   NEUROLOGIC A: Acute encephalopathy 2nd to hypoxia/hypercapnia. Muscular deconditioning. P:   - PT/OT, progress, especially as neuro better  Today's summary:  - Maintain on vent, patient does not tolerate weaning well, awaiting placement. - Consider  call GI for planned endo [place] as outpt only if she is markedly improved as may not be a candidate for any bx's much less rx if this is pancreatic ca   Alyson Reedy, M.D. [facility] Pulmonary/Critical Care Medicine. Pager: [phone]. After hours pager: [phone].

## 2012-12-11 NOTE — Progress Notes (Signed)
Patient had some respiratory distress. O2 stat dropped to 82 and RR at 34, distress was not relieved after suctioning trach. Rt was notified and patient was placed back on vent.

## 2012-12-11 NOTE — Clinical Social Work Note (Signed)
CSW reviewed chart and disposition with RN.    CSW faxed updated clinicals to [facility] for authorization.  Vickii Penna, LCSWA [phone]  Clinical Social Work

## 2012-12-11 NOTE — Progress Notes (Signed)
Noted glucose up to 294 mg/dL requiring 11 units novolog correction. Please consider reducing the correction to moderate and add tube feed coverage of 3 units q 4 hrs. If glucose rises into 200's , the correction scale is high enough to lower the glucose too much.potentiating hypoglycemia. Using tube feed coverage may be of value to prevent the hyperglycemia requiring high doses correction. If add this coverage, can reduce correction to moderate q 4 hrs  Thank you, Lenor Coffin, RN, Diabetes CNS [phone])

## 2012-12-11 NOTE — Progress Notes (Signed)
Changed aerosol setup

## 2012-12-11 NOTE — Progress Notes (Signed)
Passy-Mu[facility] Speaking Valve - Treatment Patient Details  Name: [name] MRN: [id number] Date of Birth: 1940/12/13  Today's Date: 12/11/2012 Time: 0930-     Past Medical History:  Past Medical History  Diagnosis Date  . CAD (coronary artery disease)     a. Nonobst by cath 11/11: LAD 40%, mid-dist 25-30%; prox CFX 30%, mid 40%; OM 50%; PDA 50%; PLV 50%;  EF 65%.  . NSTEMI (non-ST elevated myocardial infarction)     a. In setting of AFib with RVR 03/2010, type 2. b. Again in 11/2011 felt 2/2 afib.  . Atrial fibrillation     a. DCCV 11/17/11. b. On coumadin & amiodarone.  Marland Kitchen HOCM (hypertrophic obstructive cardiomyopathy)     a. Echo 11/2011:severe LVH, EF 65-70%, mid-cavity gradient up to . b. Echo 06/2012: EF 65-70%, severe LVH, grade 2 d/dysf.  Marland Kitchen Hypertension   . Tobacco abuse   . CKD (chronic kidney disease), stage IV   . Diastolic CHF     preserved LVF  . Iron deficiency anemia   . Third degree uterine prolapse   . Hx MRSA infection   . Hx of cardiovascular stress test     a. Lex MV 12/13:  EF 56%, no ischemia    . Diabetes   . Hyperlipidemia   . Respiratory failure     Multiple admissions for resp failure requiring intubation.  . Hypercalcemia   . LBBB (left bundle branch block)     History of transient LBBB  . C. difficile colitis 01/2012   Past Surgical History:  Past Surgical History  Procedure Laterality Date  . Tubal ligation    . Cardioversion  11/17/2011    Procedure: CARDIOVERSION;  Surgeon: Hillis Range, MD;  Location: [facility] OR;  Service: Cardiovascular;  Laterality: N/A;    Assessment / Plan / Recommendation Clinical Impression  Improved toleration of PMSV today.  Daughter and grand-daughter in to visit and were very happy to hear [name] communicating verbally.  Family called another daughter on the phone for pt. to talk with.  Family educated to placement, removal, cleaning, and precautions for use of PMSV.  PMSV was left in place for family's visit,  with family verbalizing understanding of when to call RN if Sats decrease below 90 or RR increases above 32.  RN aware, and family will notify RN to remove PMSV when they leave.  Plan had been for d/c to SNF, however, pt. went back on the vent last night, therefore, d/c has been delayed for now.  Recommend repeat FEES prior to d/c if MD agrees.    Plan       Follow Up Recommendations       Pertinent Vitals/Pain n/a    SLP Goals Potential to Achieve Goals: Good SLP Goal #1 - Progress: Met SLP Goal #2 - Progress: Met SLP Goal #3 - Progress: Met SLP Goal #4: New goal 7/31- Pt. will wear PMSV with all VSS for 30-45 with all therapies. SLP Goal #4 - Progress: Met SLP Goal #5: Pt. will complete swallow study and speech/language/cognitive evaluation with PMSV in place. SLP Goal #5 - Progress: Partly met   PMSV Trial  PMSV was placed for: One hour (Left pt. with PMV on with family after ed.) Able to redirect subglottic air through upper airway: Yes Able to Attain Phonation: Yes Voice Quality: Normal;Low vocal intensity Able to Expectorate Secretions: Yes Level of Secretion Expectoration with PMSV: Tracheal Breath Support for Phonation: Mildly decreased Intelligibility: Intelligibility reduced Word:  75-100% accurate Phrase: 75-100% accurate Sentence: 75-100% accurate Conversation: 50-74% accurate Respirations During Trial: 16 SpO2 During Trial: 96 % Pulse During Trial: 65 Behavior: Alert;Cooperative;Responsive to questions;Smiling   Tracheostomy Tube       Vent Dependency  FiO2 (%): 28 %    Cuff Deflation Trial  GO     Tolerated Cuff Deflation: Yes Length of Time for Cuff Deflation Trial: 1hour plus Behavior: Alert;Cooperative;Good eye contact;Responsive to questions;Smiling   [name] T 12/11/2012, 10:41 AM

## 2012-12-11 NOTE — Progress Notes (Signed)
ANTICOAGULATION CONSULT NOTE - Follow Up Consult  Pharmacy Consult for Warfarin Indication: atrial fibrillation  No Known Allergies  Labs:  Recent Labs  12/09/12 0500 12/10/12 0500 12/11/12 0500  HGB 8.5*  --  7.4*  HCT 26.4*  --  22.6*  PLT 212  --  194  LABPROT 26.9* 24.8* 22.2*  INR 2.59* 2.33* 2.02*  CREATININE 2.32* 2.48* 2.41*    Estimated Creatinine Clearance: 19.5 ml/min (by C-G formula based on Cr of 2.41).   Assessment: 72 y/o female patient who continues on warfarin for anticoagulation with hx Afib. INR this morning is therapeutic and trending down  Goal of Therapy:  INR 2-3   Plan:  1. Warfarin 2.5 mg daily 2. Will continue to monitor INR trend  Verlene Mayer, PharmD, [place] Pager [phone]   12/11/2012 11:09 AM

## 2012-12-11 NOTE — Clinical Social Work Note (Addendum)
2:31pm- CSW received call from [facility] with questions regarding pt medical stability and questions concerning MD documentation.  CSW referred Steward Drone to [facility] for further medical clarification.  Awaiting insurance approval.  CSW lm with Tedd Sias re: insurance authorization.  Vickii Penna, LCSWA [phone]  Clinical Social Work

## 2012-12-11 NOTE — Progress Notes (Signed)
Patient: SUBRENA DEVEREUX Date of Encounter: 12/11/2012, 9:46 AM Admit date: 11/09/2012     Subjective  Ms. Kapur denies CP or worsening SOB. She denies palpitations.   Objective  Physical Exam: Vitals: BP 114/69  Pulse 75  Temp(Src) 98.2 F (36.8 C) (Axillary)  Resp 22  Ht 5\' 2"  (1.575 m)  Wt 156 lb 8.4 oz (71 kg)  BMI 28.62 kg/m2  SpO2 98% General: Well developed, chronically ill appearing 72 year old female in no acute distress. Neck: Supple. JVD not elevated. With trach. Lungs: Diminished breath sounds throughout. No wheezes, rales, or rhonchi.  Heart: Irregular S1 S2 without murmurs, rub or gallop.  Abdomen: Soft, non-distended. Extremities: No clubbing or cyanosis. No edema.    Intake/Output:  Intake/Output Summary (Last 24 hours) at 12/11/12 0946 Last data filed at 12/11/12 0600  Gross per 24 hour  Intake 2676.67 ml  Output      0 ml  Net 2676.67 ml    Inpatient Medications:  . antiseptic oral rinse  15 mL Mouth Rinse QID  . chlorhexidine  15 mL Mouth/Throat BID  . digoxin  0.125 mg Per Tube Q M,W,F,S,S -1800  . diltiazem  60 mg Per Tube Q6H  . famotidine  20 mg Per Tube Daily  . feeding supplement  30 mL Per Tube BID  . free water  300 mL Per Tube Q4H  . hydrocortisone sod succinate (SOLU-CORTEF) inj  25 mg Intravenous Q12H  . insulin aspart  0-20 Units Subcutaneous Q4H  . insulin glargine  5 Units Subcutaneous Daily  . warfarin  2.5 mg Oral q1800  . Warfarin - Pharmacist Dosing Inpatient   Does not apply q1800   . dextrose 20 mL/hr at 12/10/12 1240  . feeding supplement (OSMOLITE 1.2 CAL) 1,000 mL (12/10/12 0730)    Labs:  Recent Labs  12/10/12 0500 12/11/12 0500  NA 140 134*  K 3.8 3.2*  CL 106 100  CO2 24 24  GLUCOSE 151* 158*  BUN 65* 64*  CREATININE 2.48* 2.41*  CALCIUM 11.2* 11.2*    Recent Labs  12/09/12 0500 12/11/12 0500  WBC 16.9* 11.3*  HGB 8.5* 7.4*  HCT 26.4* 22.6*  MCV 83.3 83.1  PLT 212 194    Recent Labs  12/11/12 0500  INR 2.02*    Radiology/Studies: Dg Chest Port 1 View  12/09/2012   *RADIOLOGY REPORT*  Clinical Data: Trach  PORTABLE CHEST - 1 VIEW  Comparison: 12/06/2012  Findings: Tracheostomy terminates at/just above the thoracic inlet.  Chronic interstitial markings.  Mild patchy right upper and left lower lobe opacities, unchanged. No pleural effusion or pneumothorax.  Cardiomegaly.  Stable right arm PICC.  IMPRESSION: Tracheostomy terminates at/just above the thoracic inlet.  Otherwise unchanged.   Original Report Authenticated By: Charline Bills, M.D.    Echocardiogram: Study Conclusions - Left ventricle: There is no SAM or LVOT gradient. There is mid and apical cavity obliteration with gradient here that exceeds 100m/sec but not well characterized The cavity size was normal. Wall thickness was increased in a pattern of severe LVH. Systolic function was vigorous. The estimated ejection fraction was in the range of 65% to 70%. There was dynamic obstruction at rest in the mid cavity, with a peak velocity of 523 cm/sec and a peak gradient of 109 mmHg. - Left atrium: The atrium was mildly dilated. - Atrial septum: No defect or patent foramen ovale was identified. - Pulmonary arteries: PA peak pressure: 48mm Hg (S).  Telemetry: AF, rate controlled    Assessment and Plan  1. Acute on chronic diastolic HF 2. Chronic kidney disease   3. Atrial fibrillation, chronic  4. Hypertrophic cardiomyopathy 5. Hypokalemia  6. Pancreatic mass  Continue digoxin and diltiazem for rate control. On Coumadin for embolic prophylaxis. Electrolytes being repleted by primary team / PCCM.  Dr. Ladona Ridgel to see Signed, Exie Parody  EP Attending Agree with above. She is not obstructed. Continue rate control.  Leonia Reeves.D.

## 2012-12-12 LAB — CBC
MCH: 26.4 pg (ref 26.0–34.0)
MCHC: 32.3 g/dL (ref 30.0–36.0)
MCV: 81.6 fL (ref 78.0–100.0)
Platelets: 210 10*3/uL (ref 150–400)
RDW: 17.9 % — ABNORMAL HIGH (ref 11.5–15.5)

## 2012-12-12 LAB — BASIC METABOLIC PANEL
Calcium: 11.1 mg/dL — ABNORMAL HIGH (ref 8.4–10.5)
Creatinine, Ser: 2.5 mg/dL — ABNORMAL HIGH (ref 0.50–1.10)
GFR calc Af Amer: 21 mL/min — ABNORMAL LOW (ref >90)
GFR calc non Af Amer: 18 mL/min — ABNORMAL LOW (ref >90)
Sodium: 131 mEq/L — ABNORMAL LOW (ref 135–145)

## 2012-12-12 LAB — PROTIME-INR: Prothrombin Time: 20.3 seconds — ABNORMAL HIGH (ref 11.6–15.2)

## 2012-12-12 LAB — MAGNESIUM: Magnesium: 2 mg/dL (ref 1.5–2.5)

## 2012-12-12 LAB — GLUCOSE, CAPILLARY: Glucose-Capillary: 225 mg/dL — ABNORMAL HIGH (ref 70–99)

## 2012-12-12 MED ORDER — LORAZEPAM 0.5 MG PO TABS
1.0000 mg | ORAL_TABLET | Freq: Four times a day (QID) | ORAL | Status: DC | PRN
  Administered 2012-12-12 – 2012-12-22 (×6): 1 mg via ORAL
  Filled 2012-12-12 (×2): qty 2
  Filled 2012-12-12 (×3): qty 1
  Filled 2012-12-12 (×2): qty 2
  Filled 2012-12-12: qty 1

## 2012-12-12 MED ORDER — FUROSEMIDE 10 MG/ML IJ SOLN
40.0000 mg | Freq: Three times a day (TID) | INTRAMUSCULAR | Status: AC
  Administered 2012-12-12 (×2): 40 mg via INTRAVENOUS
  Filled 2012-12-12 (×2): qty 4

## 2012-12-12 MED ORDER — POTASSIUM CHLORIDE 20 MEQ/15ML (10%) PO LIQD
40.0000 meq | Freq: Three times a day (TID) | ORAL | Status: AC
  Administered 2012-12-12 (×2): 40 meq
  Filled 2012-12-12 (×2): qty 30

## 2012-12-12 MED ORDER — WARFARIN SODIUM 5 MG PO TABS
5.0000 mg | ORAL_TABLET | Freq: Once | ORAL | Status: AC
  Administered 2012-12-12: 5 mg via ORAL
  Filled 2012-12-12: qty 1

## 2012-12-12 NOTE — Progress Notes (Signed)
Patient had two episodes of vomiting last occurences at 1800. There was no residual prior to both occurences. Tube feeding on hold for the next 4 hours. I spoke with Dr. Tyson Alias and he agreed to turing off tube feeding for the next 4 hours.

## 2012-12-12 NOTE — Progress Notes (Signed)
Inpatient Diabetes Program Recommendations  AACE/ADA: New Consensus Statement on Inpatient Glycemic Control (2013)  Target Ranges:  Prepandial:   less than 140 mg/dL      Peak postprandial:   less than 180 mg/dL (1-2 hours)      Critically ill patients:  140 - 180 mg/dL   Results for [name], [name] (MRN [id number]) as of 12/12/2012 14:01  Ref. Range 12/11/2012 07:31 12/11/2012 11:48 12/11/2012 16:39 12/11/2012 19:40 12/11/2012 23:48 12/12/2012 04:55 12/12/2012 07:41 12/12/2012 11:23  Glucose-Capillary Latest Range: 70-99 mg/dL 409 (H) 811 (H) 914 (H) 221 (H) 233 (H) 349 (H) 206 (H) 168 (H)    Inpatient Diabetes Program Recommendations Insulin - Basal: Please consider increasing Lantus to 10 units daily. Insulin - Meal Coverage: Please consider ordering Novolog 3 units Q4H for tube feeding coverage.  Note: Blood glucose over the past 30 hours has ranged from 134-349 mg/dl.  Currently patient is ordered Lantus 5 units daily and Novolog 0-20 units Q4H.  Patient is also ordered Solucortef 25mg  Q12H and Osmolite 55 mg/hr which are contributing to hyperglycemic.  Please consider increasing Lantus to 10 units daily and ordering Novolog 3 units Q4H for tube feeding coverage.  Will continue to follow.   Thanks, Orlando Penner, RN, MSN, CCRN Diabetes Coordinator Inpatient Diabetes Program [phone]

## 2012-12-12 NOTE — Progress Notes (Signed)
Patient ID: [name], female   DOB: 09-06-1940, 72 y.o.   MRN: [id number] Subjective:  Note that she required reinitiation of mechanical ventilation, weaning trials ongoing. The nurse notes that she had complained of pain.  Objective:  Vital Signs in the last 24 hours: Temp:  [98.5 F (36.9 C)-99.3 F (37.4 C)] 98.8 F (37.1 C) (08/05 0711) Pulse Rate:  [48-84] 67 (08/05 1029) Resp:  [16-29] 26 (08/05 1029) BP: (89-120)/(40-76) 89/72 mmHg (08/05 1029) SpO2:  [94 %-99 %] 97 % (08/05 1029) FiO2 (%):  [28 %-40 %] 35 % (08/05 1029)  Intake/Output from previous day: 08/04 0701 - 08/05 0700 In: 2380 [I.V.:260; NG/GT:2120] Out: -  Intake/Output from this shift:    Physical Exam: Chronically ill appearing, with indwelling tracheostomy tube, NAD HEENT: Unremarkable Neck:  8 cm JVD, no thyromegally, tracheostomy tube in place Back:  No CVA tenderness Lungs:  Scattered rales in the bases, no wheezes. HEART:  Regular rate rhythm, no murmurs, no rubs, no clicks Abd:  soft, positive bowel sounds, no organomegally, no rebound, no guarding Ext:  2 plus pulses, no edema, no cyanosis, no clubbing Skin:  No rashes no nodules Neuro:  CN II through XII intact, motor grossly intact  Lab Results:  Recent Labs  12/11/12 0500 12/12/12 0442  WBC 11.3* 11.6*  HGB 7.4* 7.3*  PLT 194 210    Recent Labs  12/11/12 0500 12/12/12 0442  NA 134* 131*  K 3.2* 3.6  CL 100 97  CO2 24 21  GLUCOSE 158* 327*  BUN 64* 66*  CREATININE 2.41* 2.50*   No results found for this basename: TROPONINI, CK, MB,  in the last 72 hours Hepatic Function Panel No results found for this basename: PROT, ALBUMIN, AST, ALT, ALKPHOS, BILITOT, BILIDIR, IBILI,  in the last 72 hours No results found for this basename: CHOL,  in the last 72 hours No results found for this basename: PROTIME,  in the last 72 hours  Imaging: No results found.  Cardiac Studies: Tele -  Atrial fibrillation with a controlled  ventricular response Assessment/Plan:  1. Acute on chronic diastolic heart failure 2. Pneumonia 3. Ventilatory dependent respiratory failure 4. Atrial fibrillation with a rapid/ controlled ventricular response Rec: her ventricular rate appears to be well-controlled. She will continue diuretic therapy. No plans for weaning trials.  LOS: 33 days    Artice Holohan,M.D. 12/12/2012, 10:52 AM

## 2012-12-12 NOTE — Progress Notes (Signed)
Patient wearing aerosol trach collar with 35% O2. Patient became tachypneic and sats dropped to 85%. Returned patient to mechanical ventilation.

## 2012-12-12 NOTE — Progress Notes (Signed)
PULMONARY  / CRITICAL CARE MEDICINE  Name: [name] MRN: [id number] DOB: [birth year]    ADMISSION DATE:  11/09/2012 CONSULTATION DATE:  11/09/2012  REFERRING MD :  Preston Fleeting PRIMARY SERVICE: PCCM  CHIEF COMPLAINT:  Shortness of breath.  BRIEF PATIENT DESCRIPTION: 72 y/o female with CHF was admitted on 7/3 from the [facility] ED with acute hypoxemic respiratory failure due to a CHF exacerbation, ?Superimposed PNA.  LINES / TUBES: 7/3 ETT >>11/13/12, 11/13/12  (failed extubation immediately, ? aspirated) >> 7/17 7/4 CVC R IJ >> 7/17 7/5 A-line >> 7/18 7/17 Trach (DF) >> downsized to #4 8/1 > replaced with # 6 cuffed 8/2 for resp distrss 7/17 Rt PICC >>  7/22 PEG >>>  CULTURES: 7/4 U strep >> neg 7/4 legionella >> neg 7/3 mrsa PCR >> POSITIVE 7/8 mini-BAL >> yeast 7/16 sputum >> yeast  7/16 blood >> neg 7/16 urine >> yeast 50 K colonies 7/26 C. Diff>>>neg  ANTIBIOTICS: 7/3 Ceftriaxone >>7/6 7/4 levofloxacin >>7/7 7/4 Vancomycin >>7/6, restarted 7/8 >> 7/18 7/7 Unasyn >> 7/16 7/16 cefepime >> 7/24  SIGNIFICANT EVENTS: 7/3 admission 7/07 reintubated immediately after extubation.  ? Aspiration. ABX restarted.  on Neo-Synephrine.  7/09 Able to wean fio2 and neo. Lopressor required for rate control of afib. 7/10 Off neo, but now tachycardic.  Failed SBT this AM, but RT felt it was due to sedation.  Sedation weaned 7/14 Hypotensive, vomiting, amiodarone turned off for low heart rate 7/16 Change to pressure control 7/17 Start pressors, PRBC transfusion, trach 7/18 Off pressors, PRBC transfusion 7/28- 10-12 hrs trach collar trial 7/29- recs from surgery for pancreatic lesions obtained 8/2 back on vent   STUDIES: 7/16 CT chest >> emphysema, b/l ASD, small effusions 7/16 CT abd/pelvis >> 2.6 x 2 cm nodule in head of pancreas, bone changes suggestive of Paget's disease  SUBJECTIVE:   Weaning again this am T bar but breathing is again paradoxic  VITAL SIGNS: Temp:  [98.5 F (36.9  C)-99.3 F (37.4 C)] 98.8 F (37.1 C) (08/05 0711) Pulse Rate:  [48-84] 67 (08/05 1029) Resp:  [16-29] 26 (08/05 1029) BP: (89-120)/(40-76) 89/72 mmHg (08/05 1029) SpO2:  [94 %-99 %] 97 % (08/05 1029) FiO2 (%):  [28 %-40 %] 35 % (08/05 1029)  HEMODYNAMICS:    VENTILATOR SETTINGS: Vent Mode:  [-] PRVC FiO2 (%):  [28 %-40 %] 35 % Set Rate:  [16 bmp] 16 bmp Vt Set:  [500 mL] 500 mL PEEP:  [5 cmH20] 5 cmH20 Plateau Pressure:  [16 cmH20-21 cmH20] 16 cmH20  INTAKE / OUTPUT: Intake/Output     08/04 0701 - 08/05 0700 08/05 0701 - 08/06 0700   I.V. (mL/kg) 260 (3.7)    NG/GT 2120    Total Intake(mL/kg) 2380 (33.5)    Net +2380          Urine Occurrence 3 x 1 x   Stool Occurrence 2 x     PHYSICAL EXAMINATION: Gen: Lying  in bed on trach collar mod increase wob HEENT: PEG in place, trach site clean PULM: cta CV: irregular, no m/r/g AB: soft, non tender, BS+/ Paradoxical abd movement Ext: no edema Neuro: RASS 0, follows commands, more interactive  LABS: CBC Recent Labs     12/11/12  0500  12/12/12  0442  WBC  11.3*  11.6*  HGB  7.4*  7.3*  HCT  22.6*  22.6*  PLT  194  210   Coag's Recent Labs     12/10/12  0500  12/11/12  0500  12/12/12  0442  INR  2.33*  2.02*  1.79*   BMET Recent Labs     12/10/12  0500  12/11/12  0500  12/12/12  0442  NA  140  134*  131*  K  3.8  3.2*  3.6  CL  106  100  97  CO2  24  24  21   BUN  65*  64*  66*  CREATININE  2.48*  2.41*  2.50*  GLUCOSE  151*  158*  327*   Electrolytes Recent Labs     12/10/12  0500  12/11/12  0500  12/12/12  0442  CALCIUM  11.2*  11.2*  11.1*  MG   --    --   2.0  PHOS   --    --   4.9*   Glucose Recent Labs     12/11/12  1148  12/11/12  1639  12/11/12  1940  12/11/12  2348  12/12/12  0455  12/12/12  0741  GLUCAP  294*  231*  221*  233*  349*  206*    CXR:8/2  Tracheostomy terminates at/just above the thoracic inlet.  Otherwise unchanged patchy RUL and LLL opacities     ASSESSMENT / PLAN:    PULMONARY A:  Acute respiratory failure 2nd to pulmonary infiltrates, resolving. effusions P:   - Improvement after being back on vent, will attempt TC trials again today. - PRN BD's. - Control rate to avoid diastolic CHF. - If able to tolerate TC for >24 hrs at 28% then will consider SNF placement.    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 A fib with RVR >> amiodarone d/c due to bradycardia, hypotension. Hx of CAD, HTN, hyperlipidemia. Shock >> resolved P:  - Anticoagulation managed by Cards. coumadin - Continue Cardizem PO for rate control @ 30 q6 hours.  RENAL Lab Results  Component Value Date   NA 131* 12/12/2012   NA 134* 12/11/2012   NA 140 12/10/2012    A: Acute kidney injury >> renal fx stable. Hypernatremia  Resolved with ncrease water rx Hypokalemia - resolved Hypercalcemia improving Hyperphosphatemia resolved Mag WNL P:   - Decrease free water to 250 q6. - D5W decreased to kvo 8/3 and will hold for now since Na is improved. - Positive fluid balance for the past two days but no documentation of UOP, will order strict I/O and will start diureses to assist with weaning. - Continue NGT flushes.   GASTROINTESTINAL A:   Protein malnutrition Vomiting - resolved Mass head of pancreas-- Will need further assessment of pancreatic lesion when more stable, likely in LTAC or outpatient, Ca 19-9 elevated (73.1)- concern cancer P:   - PEG tube. - Pepcid for SUP - Gen sx consulted, will order MRCP when more confident for safety on trach collar flat in MRI machine.   GYN A:  Uterine prolapse. P: - GYN consulted 7/18 >  Dx is uterine prolapse  HEMATOLOGIC  Recent Labs Lab 12/09/12 0500 12/11/12 0500 12/12/12 0442  HGB 8.5* 7.4* 7.3*    A:  Anemia of chronic disease and critical illness >> PRBC transfusion 7/17, 7/18. Thrombocytopenia  hemoconcentration 8/1 Leukocytosis - WBC 17.6 on 7/31, no fever,  most likely due to hemoconcentration  P:  - Monitor CBC  - Continue coumadin  INFECTIOUS A: Initial concern for aspiration pneumonia >> has persistent pulmonary infiltrates, and increasing WBC. P:   - No abx, follow fever curve  ENDOCRINE  A:  DM2, uncontrolled since 7/31 P:   - SSI - Lantus to be increased if needing more SSI   NEUROLOGIC A: Acute encephalopathy 2nd to hypoxia/hypercapnia. Muscular deconditioning. P:   - PT/OT, progress, especially as neuro better    Today's summary:  - Maintain on vent, patient does not tolerate weaning well, awaiting placement. - Consider  call GI for planned endo [place] as outpt only if she is markedly improved as may not be a candidate for any bx's much less rx if this is pancreatic ca at this point.   Alyson Reedy, M.D. [facility] Pulmonary/Critical Care Medicine. Pager: [phone]. After hours pager: [phone].

## 2012-12-12 NOTE — Progress Notes (Signed)
ANTICOAGULATION CONSULT NOTE - Follow Up Consult  Pharmacy Consult for Warfarin Indication: atrial fibrillation  No Known Allergies  Labs:  Recent Labs  12/10/12 0500 12/11/12 0500 12/12/12 0442  HGB  --  7.4* 7.3*  HCT  --  22.6* 22.6*  PLT  --  194 210  LABPROT 24.8* 22.2* 20.3*  INR 2.33* 2.02* 1.79*  CREATININE 2.48* 2.41* 2.50*    Estimated Creatinine Clearance: 18.8 ml/min (by C-G formula based on Cr of 2.5).   Assessment: 72 y/o female patient who continues on warfarin for anticoagulation with hx Afib. INR this morning is sub-therapeutic and trending down  Goal of Therapy:  INR 2-3   Plan:  1. Warfarin 5 mg po x 1 2. Will continue to monitor INR trend  Thank you. Okey Regal, PharmD [phone] 12/12/2012 8:39 AM

## 2012-12-13 ENCOUNTER — Inpatient Hospital Stay (HOSPITAL_COMMUNITY): Payer: Medicare Other

## 2012-12-13 LAB — GLUCOSE, CAPILLARY
Glucose-Capillary: 109 mg/dL — ABNORMAL HIGH (ref 70–99)
Glucose-Capillary: 148 mg/dL — ABNORMAL HIGH (ref 70–99)
Glucose-Capillary: 247 mg/dL — ABNORMAL HIGH (ref 70–99)
Glucose-Capillary: 250 mg/dL — ABNORMAL HIGH (ref 70–99)
Glucose-Capillary: 308 mg/dL — ABNORMAL HIGH (ref 70–99)

## 2012-12-13 LAB — BASIC METABOLIC PANEL
BUN: 65 mg/dL — ABNORMAL HIGH (ref 6–23)
CO2: 23 mEq/L (ref 19–32)
Chloride: 101 mEq/L (ref 96–112)
Glucose, Bld: 274 mg/dL — ABNORMAL HIGH (ref 70–99)
Potassium: 3.8 mEq/L (ref 3.5–5.1)

## 2012-12-13 LAB — CBC
HCT: 21.7 % — ABNORMAL LOW (ref 36.0–46.0)
Hemoglobin: 7 g/dL — ABNORMAL LOW (ref 12.0–15.0)
WBC: 11.4 10*3/uL — ABNORMAL HIGH (ref 4.0–10.5)

## 2012-12-13 LAB — PROTIME-INR: INR: 1.77 — ABNORMAL HIGH (ref 0.00–1.49)

## 2012-12-13 MED ORDER — FENTANYL CITRATE 0.05 MG/ML IJ SOLN
INTRAMUSCULAR | Status: AC
  Filled 2012-12-13: qty 2

## 2012-12-13 MED ORDER — WARFARIN SODIUM 5 MG PO TABS
5.0000 mg | ORAL_TABLET | Freq: Once | ORAL | Status: AC
  Administered 2012-12-13: 5 mg via ORAL
  Filled 2012-12-13: qty 1

## 2012-12-13 MED ORDER — HYDRALAZINE HCL 20 MG/ML IJ SOLN
INTRAMUSCULAR | Status: AC
  Filled 2012-12-13: qty 1

## 2012-12-13 MED ORDER — FENTANYL CITRATE 0.05 MG/ML IJ SOLN
100.0000 ug | INTRAMUSCULAR | Status: DC | PRN
  Administered 2012-12-13 – 2012-12-22 (×9): 100 ug via INTRAVENOUS
  Filled 2012-12-13 (×10): qty 2

## 2012-12-13 NOTE — Progress Notes (Signed)
ANTICOAGULATION CONSULT NOTE - Follow Up Consult  Pharmacy Consult for Warfarin Indication: atrial fibrillation  No Known Allergies  Labs:  Recent Labs  12/11/12 0500 12/12/12 0442 12/13/12 0408  HGB 7.4* 7.3* 7.0*  HCT 22.6* 22.6* 21.7*  PLT 194 210 189  LABPROT 22.2* 20.3* 20.1*  INR 2.02* 1.79* 1.77*  CREATININE 2.41* 2.50* 2.54*    Estimated Creatinine Clearance: 18.5 ml/min (by C-G formula based on Cr of 2.54).   Assessment: 72 y/o female patient who continues on warfarin for anticoagulation with hx Afib. INR this morning is sub-therapeutic and trending down  Goal of Therapy:  INR 2-3   Plan:  1. Repeat Warfarin 5 mg po x 1 2. Will continue to monitor INR trend  Thank you. Okey Regal, PharmD [phone] 12/13/2012 8:56 AM

## 2012-12-13 NOTE — Clinical Social Work Note (Signed)
CSW has sent referral information to Vent SNFs: [employer] [facility]  CSW received Medicaid application from daughter and attached it to the referral for [facility] [place] [place] SNF.  CSW is also needing 3 months bank statements for the pt to attach to the referral before a decision can be reached.  Vickii Penna, LCSWA [phone]  Clinical Social Work

## 2012-12-13 NOTE — Progress Notes (Signed)
Elink was called to reassess patient and informed the [facility] MD regarding patient's medical status. Dr.Zubelivitsky, suggested to the RT to check the circuits to make sure that there is no leak.  MD  informed us (Me and RT) that he will talk to Dr. Sung Amabile to come and check patient.  Dr. Sung Amabile came and ordered Fentanyl and Versed in addition to Versed that was already given.  Chest x ray was also ordered.  Patient is currently resting, O2 sats and RR is within normal.

## 2012-12-13 NOTE — Progress Notes (Signed)
PULMONARY  / CRITICAL CARE MEDICINE  Name: [name] MRN: [id number] DOB: 03-04-1941    ADMISSION DATE:  11/09/2012 CONSULTATION DATE:  11/09/2012  REFERRING MD :  Preston Fleeting PRIMARY SERVICE: PCCM  CHIEF COMPLAINT:  Shortness of breath.  BRIEF PATIENT DESCRIPTION: 72 y/o female with CHF was admitted on 7/3 from the [facility] ED with acute hypoxemic respiratory failure due to a CHF exacerbation, ?Superimposed PNA.  LINES / TUBES: 7/3 ETT >>11/13/12, 11/13/12  (failed extubation immediately, ? aspirated) >> 7/17 7/4 CVC R IJ >> 7/17 7/5 A-line >> 7/18 7/17 Trach (DF) >> downsized to #4 8/1 > replaced with # 6 cuffed 8/2 for resp distrss 7/17 Rt PICC >>  7/22 PEG >>>  CULTURES: 7/4 U strep >> neg 7/4 legionella >> neg 7/3 mrsa PCR >> POSITIVE 7/8 mini-BAL >> yeast 7/16 sputum >> yeast  7/16 blood >> neg 7/16 urine >> yeast 50 K colonies 7/26 C. Diff>>>neg  ANTIBIOTICS: 7/3 Ceftriaxone >>7/6 7/4 levofloxacin >>7/7 7/4 Vancomycin >>7/6, restarted 7/8 >> 7/18 7/7 Unasyn >> 7/16 7/16 cefepime >> 7/24  SIGNIFICANT EVENTS: 7/3 admission 7/07 reintubated immediately after extubation.  ? Aspiration. ABX restarted.  on Neo-Synephrine.  7/09 Able to wean fio2 and neo. Lopressor required for rate control of afib. 7/10 Off neo, but now tachycardic.  Failed SBT this AM, but RT felt it was due to sedation.  Sedation weaned 7/14 Hypotensive, vomiting, amiodarone turned off for low heart rate 7/16 Change to pressure control 7/17 Start pressors, PRBC transfusion, trach 7/18 Off pressors, PRBC transfusion 7/28- 10-12 hrs trach collar trial 7/29- recs from surgery for pancreatic lesions obtained 8/2 back on vent   STUDIES: 7/16 CT chest >> emphysema, b/l ASD, small effusions 7/16 CT abd/pelvis >> 2.6 x 2 cm nodule in head of pancreas, bone changes suggestive of Paget's disease  SUBJECTIVE:     VITAL SIGNS: Temp:  [98.2 F (36.8 C)-98.7 F (37.1 C)] 98.2 F (36.8 C) (08/06 0825) Pulse  Rate:  [54-85] 64 (08/06 0859) Resp:  [18-31] 22 (08/06 0825) BP: (100-136)/(51-69) 110/64 mmHg (08/06 0859) SpO2:  [88 %-100 %] 98 % (08/06 0859) FiO2 (%):  [35 %-40 %] 35 % (08/06 0859)  HEMODYNAMICS:    VENTILATOR SETTINGS: Vent Mode:  [-] PRVC FiO2 (%):  [35 %-40 %] 35 % Set Rate:  [16 bmp] 16 bmp Vt Set:  [500 mL] 500 mL PEEP:  [5 cmH20] 5 cmH20 Plateau Pressure:  [14 cmH20-19 cmH20] 17 cmH20  INTAKE / OUTPUT: Intake/Output     08/05 0701 - 08/06 0700 08/06 0701 - 08/07 0700   I.V. (mL/kg) 480 (6.8) 40 (0.6)   NG/GT 1895 370   Total Intake(mL/kg) 2375 (33.5) 410 (5.8)   Urine (mL/kg/hr) 2375 (1.4) 400 (1.6)   Total Output 2375 400   Net 0 +10        Urine Occurrence 5 x    Stool Occurrence 2 x    Emesis Occurrence 2 x     PHYSICAL EXAMINATION: Gen: Lying  in bed on trach collar mod increase wob HEENT: PEG in place, trach site clean PULM: cta CV: irregular, no m/r/g AB: soft, non tender, BS+ Ext: no edema Neuro: RASS 0, follows commands, more interactive  LABS: CBC Recent Labs     12/11/12  0500  12/12/12  0442  12/13/12  0408  WBC  11.3*  11.6*  11.4*  HGB  7.4*  7.3*  7.0*  HCT  22.6*  22.6*  21.7*  PLT  194  210  189   Coag's Recent Labs     12/11/12  0500  12/12/12  0442  12/13/12  0408  INR  2.02*  1.79*  1.77*   BMET Recent Labs     12/11/12  0500  12/12/12  0442  12/13/12  0408  NA  134*  131*  136  K  3.2*  3.6  3.8  CL  100  97  101  CO2  24  21  23   BUN  64*  66*  65*  CREATININE  2.41*  2.50*  2.54*  GLUCOSE  158*  327*  274*   Electrolytes Recent Labs     12/11/12  0500  12/12/12  0442  12/13/12  0408  CALCIUM  11.2*  11.1*  11.5*  MG   --   2.0  1.9  PHOS   --   4.9*  4.4   Glucose Recent Labs     12/12/12  1123  12/12/12  1601  12/12/12  2003  12/13/12  0027  12/13/12  0417  12/13/12  0823  GLUCAP  168*  139*  225*  109*  247*  148*   CXR:8/2  Tracheostomy terminates at/just above the thoracic inlet.   Otherwise unchanged patchy RUL and LLL opacities   ASSESSMENT / PLAN:    PULMONARY A:  Acute respiratory failure 2nd to pulmonary infiltrates, resolving. Effusions.  Had to go on vent overnight.  Now on TC P:   - PRN BD's. - Control rate to avoid diastolic CHF. - If able to tolerate TC for >24 hrs at 28% then will consider SNF placement.    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 A fib with RVR >> amiodarone d/c due to bradycardia, hypotension. Hx of CAD, HTN, hyperlipidemia. Shock >> resolved P:  - Anticoagulation managed by Cards. coumadin - Continue Cardizem PO for rate control @ 30 q6 hours.  RENAL Lab Results  Component Value Date   NA 136 12/13/2012   NA 131* 12/12/2012   NA 134* 12/11/2012    A: Acute kidney injury >> renal fx stable. Hypernatremia  Resolved with increase water rx Hypokalemia - resolved Hypercalcemia improving Hyperphosphatemia resolved Mag WNL P:   - continue lasix - Negative fluid balance - Strict I/O - Continue NGT flushes.   GASTROINTESTINAL A:   Protein malnutrition Mass head of pancreas-- Will need further assessment of pancreatic lesion when more stable, likely in LTAC or outpatient, Ca 19-9 elevated (73.1)- concern cancer P:   - PEG tube. - Pepcid for SUP - Gen sx consulted, will order MRCP when more confident for safety on trach collar flat in MRI machine.   GYN A:  Uterine prolapse. P: - GYN consulted 7/18 >  Dx is uterine prolapse  HEMATOLOGIC  Recent Labs Lab 12/11/12 0500 12/12/12 0442 12/13/12 0408  HGB 7.4* 7.3* 7.0*    A:  Anemia of chronic disease and critical illness >> PRBC transfusion 7/17, 7/18. Thrombocytopenia - resolved  hemoconcentration 8/1 Mild Leukocytosis - WBC 11.4 on 8/6, no fever, most likely due to hemoconcentration  P:  - Monitor CBC  - Continue coumadin  INFECTIOUS A: Initial concern for aspiration pneumonia >> has persistent pulmonary infiltrates, and  increasing WBC. P:   - No abx, follow fever curve  ENDOCRINE A:  DM2, uncontrolled since 7/31 P:   - SSI - Lantus to be increased if needing more SSI   NEUROLOGIC A: Acute encephalopathy  2nd to hypoxia/hypercapnia. Muscular deconditioning. P:   - PT/OT, progress, especially as neuro better    Today's summary:  - awaiting placement. - Consider call GI for planned endo [place] as outpt only if she is markedly improved as may not be a candidate for any bx's much less rx if this is pancreatic ca at this point.  Leo Rod, PA-S  Will attempt TC for 24 hours, if tolerated then can go to SNF, if not then will require vent SNF.  Patient seen and examined, agree with above note.  I dictated the care and orders written for this patient under my direction.  Alyson Reedy, MD [phone]

## 2012-12-13 NOTE — Progress Notes (Signed)
Patient on trach collar, family came by to visit the patient.  Noted that  Patient's breathing is rapid and labored. Ativan given as PRN.  Will continue to assess if patient needs to be back on vent.  RT is made aware of this episode.

## 2012-12-13 NOTE — Clinical Social Work Note (Signed)
CSW spoke with pt daughter, [place], [phone], who states that pt does not have bank account.  CSW requested daughter to pen this to paper and CSW will send the letter in with the [place] Medicaid application.  RN aware.  Vickii Penna, LCSWA [phone]  Clinical Social Work

## 2012-12-13 NOTE — Progress Notes (Signed)
Dr. Molli Knock is made aware that pt's breathing is rapid and labored.  He ordered to put patient back on vent.  Patient was put back on vent, Versed was given to calm her down.

## 2012-12-14 LAB — GLUCOSE, CAPILLARY
Glucose-Capillary: 269 mg/dL — ABNORMAL HIGH (ref 70–99)
Glucose-Capillary: 132 mg/dL — ABNORMAL HIGH (ref 70–99)

## 2012-12-14 LAB — CBC
HCT: 20.6 % — ABNORMAL LOW (ref 36.0–46.0)
Hemoglobin: 6.8 g/dL — CL (ref 12.0–15.0)
MCH: 26.8 pg (ref 26.0–34.0)
MCHC: 32.7 g/dL (ref 30.0–36.0)
MCV: 82 fL (ref 78.0–100.0)
Platelets: 236 10*3/uL (ref 150–400)
RBC: 2.52 MIL/uL — ABNORMAL LOW (ref 3.87–5.11)
RDW: 18.9 % — ABNORMAL HIGH (ref 11.5–15.5)

## 2012-12-14 LAB — BASIC METABOLIC PANEL
BUN: 73 mg/dL — ABNORMAL HIGH (ref 6–23)
CO2: 25 mEq/L (ref 19–32)
Glucose, Bld: 104 mg/dL — ABNORMAL HIGH (ref 70–99)
Potassium: 3.7 mEq/L (ref 3.5–5.1)
Sodium: 138 mEq/L (ref 135–145)

## 2012-12-14 LAB — PROTIME-INR
INR: 2.05 — ABNORMAL HIGH (ref 0.00–1.49)
Prothrombin Time: 22.5 seconds — ABNORMAL HIGH (ref 11.6–15.2)

## 2012-12-14 MED ORDER — WARFARIN SODIUM 2.5 MG PO TABS
2.5000 mg | ORAL_TABLET | ORAL | Status: DC
  Administered 2012-12-14 – 2012-12-21 (×5): 2.5 mg via ORAL
  Filled 2012-12-14 (×6): qty 1

## 2012-12-14 MED ORDER — PRO-STAT SUGAR FREE PO LIQD
30.0000 mL | Freq: Three times a day (TID) | ORAL | Status: DC
  Administered 2012-12-14 – 2012-12-22 (×24): 30 mL
  Filled 2012-12-14 (×27): qty 30

## 2012-12-14 MED ORDER — OSMOLITE 1.2 CAL PO LIQD
1000.0000 mL | ORAL | Status: DC
  Administered 2012-12-14 – 2012-12-22 (×7): 1000 mL
  Filled 2012-12-14 (×10): qty 1000

## 2012-12-14 MED ORDER — WARFARIN SODIUM 5 MG PO TABS
5.0000 mg | ORAL_TABLET | ORAL | Status: DC
  Administered 2012-12-15 – 2012-12-20 (×3): 5 mg via ORAL
  Filled 2012-12-14 (×4): qty 1

## 2012-12-14 NOTE — Progress Notes (Signed)
Pt tolerated trach collar for 5 hours. Restlessness noted 13:30, pt suctioned, repositioned. Replaced on vent  @ 14:00 with RR dropping from 38 to 26. No pain meds given today.

## 2012-12-14 NOTE — Progress Notes (Signed)
NUTRITION FOLLOW UP  Intervention:    Change EN regimen to Osmolite 1.2 formula at goal rate of 45 ml/hr with Prostat liquid protein 30 ml 3 times daily via tube to provide 1596 total kcals, 105 gm protein, 885 ml of free water RD to follow for nutrition care plan  Nutrition Dx:   Inadequate oral intake related to inability to eat as evidenced by NPO status, ongoing  Goal:   EN to meet > 90% of estimated nutrition needs, met  Monitor:   EN regimen & tolerance, respiratory status, weight, labs, I/O's  Assessment:   Patient with hx of CHF admitted from [facility] ED with acute hypoxemic respiratory failure due to CHF exacerbation.   Patient is currently on ventilator support  MV: 13.7 Temp: 36.9  S/p FEES 7/31 ---> SLP recommending continued NPO status.  Trach changed 8/1.  Will likely need LTACH.   Osmolite 1.2 formula infusing at 55 ml/hr via PEG tube with Prostat liquid protein 30 ml 2 times daily providing 1784 total kcals, 103 gm protein, 1082 ml of free water. Free water flushes at 250 ml every 6 hours.   RD with EN management privileges, initially consulted 7/4.  Height: Ht Readings from Last 1 Encounters:  11/09/12 5\' 2"  (1.575 m)    Weight Status ---> fluctuating  Wt Readings from Last 1 Encounters:  12/10/12 156 lb 8.4 oz (71 kg)    8/03  156 lb 8/03  167 lb 7/30  177 lb 7/30  178 lb 7/29  178 lb 7/28  167 lb 7/27  168 lb 7/26  173 lb 7/25  175 lb 7/24  182 lb  Body mass index is 28.62 kg/(m^2).  Re-estimated needs:  Kcal: 1500-1650 Protein: 95-105 gm Fluid: >/= 1.5 L  Skin: Intact  Diet Order: NPO   Intake/Output Summary (Last 24 hours) at 12/14/12 1035 Last data filed at 12/14/12 1000  Gross per 24 hour  Intake   2605 ml  Output   1375 ml  Net   1230 ml    Labs:   Recent Labs Lab 12/12/12 0442 12/13/12 0408 12/14/12 0400  NA 131* 136 138  K 3.6 3.8 3.7  CL 97 101 102  CO2 21 23 25   BUN 66* 65* 73*  CREATININE 2.50* 2.54* 2.92*   CALCIUM 11.1* 11.5* 11.3*  MG 2.0 1.9  --   PHOS 4.9* 4.4  --   GLUCOSE 327* 274* 104*    CBG (last 3)   Recent Labs  12/14/12 0017 12/14/12 0426 12/14/12 0756  GLUCAP 269* 104* 132*    Scheduled Meds: . antiseptic oral rinse  15 mL Mouth Rinse QID  . chlorhexidine  15 mL Mouth/Throat BID  . digoxin  0.125 mg Per Tube Q M,W,F,S,S -1800  . diltiazem  60 mg Per Tube Q6H  . famotidine  20 mg Per Tube Daily  . feeding supplement  30 mL Per Tube BID  . free water  250 mL Per Tube Q6H  . hydrocortisone sod succinate (SOLU-CORTEF) inj  25 mg Intravenous Q12H  . insulin aspart  0-20 Units Subcutaneous Q4H  . insulin glargine  5 Units Subcutaneous Daily  . warfarin  2.5 mg Oral Q T,Th,S,Su-1800  . [START ON 12/15/2012] warfarin  5 mg Oral Q M,W,F-1800  . Warfarin - Pharmacist Dosing Inpatient   Does not apply q1800    Continuous Infusions: . dextrose 20 mL/hr at 12/10/12 1240  . feeding supplement (OSMOLITE 1.2 CAL) 1,000 mL (12/10/12 0730)  Arthur Holms, RD, LDN Pager #: [phone] After-Hours Pager #: [phone]

## 2012-12-14 NOTE — Progress Notes (Signed)
ANTICOAGULATION CONSULT NOTE - Follow Up Consult  Pharmacy Consult for Warfarin Indication: atrial fibrillation  No Known Allergies  Labs:  Recent Labs  12/12/12 0442 12/13/12 0408 12/14/12 0400  HGB 7.3* 7.0* 6.8*  HCT 22.6* 21.7* 20.6*  PLT 210 189 182  LABPROT 20.3* 20.1* 22.5*  INR 1.79* 1.77* 2.05*  CREATININE 2.50* 2.54* 2.92*    Estimated Creatinine Clearance: 16.1 ml/min (by C-G formula based on Cr of 2.92).   Assessment: 72 y/o female patient who continues on warfarin for anticoagulation with hx Afib. INR this morning is therapeutic  Goal of Therapy:  INR 2-3   Plan:  1. Coumadin 5 mg MWF, 2.5 mg TTSS 2. Will continue to monitor INR trend  Thank you. Okey Regal, PharmD [phone] 12/14/2012 8:07 AM

## 2012-12-14 NOTE — Progress Notes (Signed)
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  OBJECTIVE Filed Vitals:   12/14/12 0019 12/14/12 0022 12/14/12 0424 12/14/12 0707  BP:  100/47 90/57 110/58  Pulse:  61 63 71  Temp: 97.4 F (36.3 C)  97.3 F (36.3 C) 98.4 F (36.9 C)  TempSrc: Oral  Axillary Axillary  Resp:  20 27 24   Height:      Weight:      SpO2:  99% 100% 100%    Intake/Output Summary (Last 24 hours) at 12/14/12 0718 Last data filed at 12/14/12 0710  Gross per 24 hour  Intake   2155 ml  Output   1775 ml  Net    380 ml   Filed Weights   12/06/12 0444 12/10/12 0329 12/10/12 0742  Weight: 177 lb 4 oz (80.4 kg) 167 lb 8.8 oz (76 kg) 156 lb 8.4 oz (71 kg)    PHYSICAL EXAM General: Well developed, well nourished, female in no acute distress. Head: Normocephalic, atraumatic.  Neck: Supple without bruits, JVD difficult to assess due to trach Lungs:  Resp regular and unlabored, rales bases, some coarse. Heart: RRR, S1, S2, no S3, S4, 2/6 murmur; no rub. Abdomen: Soft, non-tender, non-distended, BS + x 4.  Extremities: No clubbing, cyanosis, Tr edema Neuro: Alert and oriented X 3. Moves all extremities spontaneously. Psych: Normal affect.  LABS: CBC: Recent Labs  12/13/12 0408 12/14/12 0400  WBC 11.4* 12.0*  HGB 7.0* 6.8*  HCT 21.7* 20.6*  MCV 81.9 81.7  PLT 189 182   INR: Recent Labs  12/14/12 0400  INR 2.05*   Basic Metabolic Panel: Recent Labs  12/12/12 0442 12/13/12 0408  12/14/12 0400  NA 131* 136 138  K 3.6 3.8 3.7  CL 97 101 102  CO2 21 23 25   GLUCOSE 327* 274* 104*  BUN 66* 65* 73*  CREATININE 2.50* 2.54* 2.92*  CALCIUM 11.1* 11.5* 11.3*  MG 2.0 1.9  --   PHOS 4.9* 4.4  --    BNP: Pro B Natriuretic peptide (BNP)  Date/Time Value Range Status  12/05/2012  5:00 AM 33388.0* 0 - 125 pg/mL Final  12/02/2012 10:55 AM 10272.5* 0 - 125 pg/mL Final    TELE:   atrial fib, rarely > 100, occasionally < 50, generally overnight. Occasional PVCs and pairs.   Radiology/Studies: Dg Chest Port 1 View 12/13/2012   *RADIOLOGY REPORT*  Clinical Data: Respiratory distress  PORTABLE CHEST - 1 VIEW  Comparison: Portable chest x-ray of 12/09/2012  Findings: The tracheostomy and right PICC line appear unchanged. There is diffuse airspace disease which has worsened somewhat and may be due to pneumonia or edema.  Cardiomegaly is stable.  IMPRESSION:  1.  Worsening of patchy airspace disease.  Consider pneumonia or edema. 2.  Stable tracheostomy and right PICC line. 3.  Stable cardiomegaly   Original Report Authenticated By: Dwyane Dee, M.D.     Current Medications:  . antiseptic oral rinse  15 mL Mouth Rinse QID  . chlorhexidine  15  mL Mouth/Throat BID  . digoxin  0.125 mg Per Tube Q M,W,F,S,S -1800  . diltiazem  60 mg Per Tube Q6H  . famotidine  20 mg Per Tube Daily  . feeding supplement  30 mL Per Tube BID  . free water  250 mL Per Tube Q6H  . hydrocortisone sod succinate (SOLU-CORTEF) inj  25 mg Intravenous Q12H  . insulin aspart  0-20 Units Subcutaneous Q4H  . insulin glargine  5 Units Subcutaneous Daily  . Warfarin - Pharmacist Dosing Inpatient   Does not apply q1800   . dextrose 20 mL/hr at 12/10/12 1240  . feeding supplement (OSMOLITE 1.2 CAL) 1,000 mL (12/10/12 0730)    ASSESSMENT AND PLAN:   FIBRILLATION, ATRIAL - rate well-controlled on Cardizem 60 mg  q 6h. However, dose held this am due to bradycardia. Has only rec'd 4 doses once. Continue to follow, may  need to change to 60 mg q 8 hr for consistency.    Acute on chronic diastolic congestive heart failure     CHRONIC KIDNEY DISEASE STAGE V    CAD - non-obstructive by cath, no ischemic symptoms.    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   Signed, Theodore Demark , PA-C 7:18 AM 12/14/2012  Patient seen and examined.  I have amended PE to reflect my findings.   See additional note I have entered for today.  Dietrich Pates 8:25 PM 12/14/2012

## 2012-12-14 NOTE — Clinical Social Work Note (Addendum)
10:28am- faxed respiratory, speech and current progress note to [facility].    [facility] and [facility] requested additional information from CSW re: pt medical condition.  CSW faxed med list, and chest x-ray to [facility] and [facility].  Pt currently under review.  Vickii Penna, LCSWA [phone]  Clinical Social Work

## 2012-12-14 NOTE — Progress Notes (Signed)
PULMONARY  / CRITICAL CARE MEDICINE  Name: [name] MRN: [id number] DOB: 1940/05/19    ADMISSION DATE:  11/09/2012 CONSULTATION DATE:  11/09/2012  REFERRING MD :  Preston Fleeting PRIMARY SERVICE: PCCM  CHIEF COMPLAINT:  Shortness of breath.  BRIEF PATIENT DESCRIPTION: 72 y/o female with CHF was admitted on 7/3 from the [facility] ED with acute hypoxemic respiratory failure due to a CHF exacerbation, ?Superimposed PNA.  LINES / TUBES: 7/3 ETT >>11/13/12, 11/13/12  (failed extubation immediately, ? aspirated) >> 7/17 7/4 CVC R IJ >> 7/17 7/5 A-line >> 7/18 7/17 Trach (DF) >> downsized to #4 8/1 > replaced with # 6 cuffed 8/2 for resp distrss 7/17 Rt PICC >>  7/22 PEG >>>  CULTURES: 7/4 U strep >> neg 7/4 legionella >> neg 7/3 mrsa PCR >> POSITIVE 7/8 mini-BAL >> yeast 7/16 sputum >> yeast  7/16 blood >> neg 7/16 urine >> yeast 50 K colonies 7/26 C. Diff>>>neg  ANTIBIOTICS: 7/3 Ceftriaxone >>7/6 7/4 levofloxacin >>7/7 7/4 Vancomycin >>7/6, restarted 7/8 >> 7/18 7/7 Unasyn >> 7/16 7/16 cefepime >> 7/24  SIGNIFICANT EVENTS: 7/3 admission 7/07 reintubated immediately after extubation.  ? Aspiration. ABX restarted.  on Neo-Synephrine.  7/09 Able to wean fio2 and neo. Lopressor required for rate control of afib. 7/10 Off neo, but now tachycardic.  Failed SBT this AM, but RT felt it was due to sedation.  Sedation weaned 7/14 Hypotensive, vomiting, amiodarone turned off for low heart rate 7/16 Change to pressure control 7/17 Start pressors, PRBC transfusion, trach 7/18 Off pressors, PRBC transfusion 7/28- 10-12 hrs trach collar trial 7/29- recs from surgery for pancreatic lesions obtained 8/2 back on vent   STUDIES: 7/16 CT chest >> emphysema, b/l ASD, small effusions 7/16 CT abd/pelvis >> 2.6 x 2 cm nodule in head of pancreas, bone changes suggestive of Paget's disease  SUBJECTIVE:  Patient in bed, on TC   VITAL SIGNS: Temp:  [97.1 F (36.2 C)-98.4 F (36.9 C)] 98.4 F (36.9 C)  (08/07 0707) Pulse Rate:  [61-74] 71 (08/07 0707) Resp:  [20-33] 24 (08/07 0707) BP: (90-113)/(45-64) 110/58 mmHg (08/07 0707) SpO2:  [94 %-100 %] 100 % (08/07 0707) FiO2 (%):  [35 %-40 %] 40 % (08/07 0707)  HEMODYNAMICS:    VENTILATOR SETTINGS: Vent Mode:  [-] PRVC FiO2 (%):  [35 %-40 %] 40 % Set Rate:  [16 bmp] 16 bmp Vt Set:  [500 mL] 500 mL PEEP:  [5 cmH20] 5 cmH20 Plateau Pressure:  [16 cmH20-19 cmH20] 16 cmH20  INTAKE / OUTPUT: Intake/Output     08/06 0701 - 08/07 0700 08/07 0701 - 08/08 0700   I.V. (mL/kg) 460 (6.5) 40 (0.6)   NG/GT 2080 360   Total Intake(mL/kg) 2540 (35.8) 400 (5.6)   Urine (mL/kg/hr) 1625 (1) 150 (1.6)   Total Output 1625 150   Net +915 +250        Stool Occurrence 4 x     PHYSICAL EXAMINATION: Gen: Lying  in bed on trach collar mod increase wob HEENT: PEG in place, trach site clean PULM: cta CV: irregular, no m/r/g AB: soft, non tender, BS+ Ext: no edema Neuro: RASS 0, follows commands, more interactive  LABS: CBC Recent Labs     12/12/12  0442  12/13/12  0408  12/14/12  0400  WBC  11.6*  11.4*  12.0*  HGB  7.3*  7.0*  6.8*  HCT  22.6*  21.7*  20.6*  PLT  210  189  182   Coag's Recent  Labs     12/12/12  0442  12/13/12  0408  12/14/12  0400  INR  1.79*  1.77*  2.05*   BMET Recent Labs     12/12/12  0442  12/13/12  0408  12/14/12  0400  NA  131*  136  138  K  3.6  3.8  3.7  CL  97  101  102  CO2  21  23  25   BUN  66*  65*  73*  CREATININE  2.50*  2.54*  2.92*  GLUCOSE  327*  274*  104*   Electrolytes Recent Labs     12/12/12  0442  12/13/12  0408  12/14/12  0400  CALCIUM  11.1*  11.5*  11.3*  MG  2.0  1.9   --   PHOS  4.9*  4.4   --    Glucose Recent Labs     12/13/12  1613  12/13/12  1718  12/13/12  1956  12/14/12  0017  12/14/12  0426  12/14/12  0756  GLUCAP  290*  308*  250*  269*  104*  132*   CXR: 8/6 - worsening patchy airspace disease, ? pna v. Edema, stable cardiomegaly  ASSESSMENT /  PLAN:    PULMONARY A:  Acute respiratory failure 2nd to pulmonary infiltrates, resolving. Effusions.  Had to go on vent overnight.  Now on TC ? Edema v. pna on 8/6 cxr P:   - PRN BD's. - Control rate to avoid diastolic CHF. - If able to tolerate TC for >24 hrs at 28% then will consider SNF placement. - continue lasix    CARDIOVASCULAR A:  HOCM with acute on chronic diastolic heart failure. A fib with RVR >> amiodarone d/c due to bradycardia, hypotension. Hx of CAD, HTN, hyperlipidemia. Shock >> resolved P:  - Anticoagulation managed by Cards. coumadin - Continue Cardizem PO for rate control @ 30 q6 hour, per cards - may need to change to 60mg  q 8hr for consistency   RENAL Lab Results  Component Value Date   NA 138 12/14/2012   NA 136 12/13/2012   NA 131* 12/12/2012    A: Acute kidney injury >> renal fx stable. Hypernatremia  Resolved with increase water rx Hypokalemia - resolved Hypercalcemia improving Hyperphosphatemia resolved Mag WNL P:   - dc free water - Negative fluid balance - Strict I/O - Continue NGT flushes.   GASTROINTESTINAL A:   Protein malnutrition Mass head of pancreas-- Will need further assessment of pancreatic lesion when more stable, likely in LTAC or outpatient, Ca 19-9 elevated (73.1)- concern cancer P:   - PEG tube. - Pepcid for SUP - Gen sx consulted, will order MRCP when more confident for safety on trach collar flat in MRI machine.   GYN A:  Uterine prolapse. P: - GYN consulted 7/18 >  Dx is uterine prolapse  HEMATOLOGIC  Recent Labs Lab 12/12/12 0442 12/13/12 0408 12/14/12 0400  HGB 7.3* 7.0* 6.8*    A:  Anemia of chronic disease and critical illness >> PRBC transfusion 7/17, 7/18. Thrombocytopenia - resolved  ? Dilutional anemia  Mild Leukocytosis - WBC 12.0 on 8/7, no fever, most likely due to hemoconcentration  P:  - Monitor CBC  - Continue coumadin - dc free water  INFECTIOUS A: Initial concern for aspiration  pneumonia >> has persistent pulmonary infiltrates, and increasing WBC. P:   - No abx, follow fever curve  ENDOCRINE A:  DM2, uncontrolled since 7/31 P:   -  SSI - Lantus to be increased if needing more SSI   NEUROLOGIC A: Acute encephalopathy 2nd to hypoxia/hypercapnia. Muscular deconditioning. P:   - PT/OT, progress, especially as neuro better    Today's summary:  - awaiting placement. - Consider call GI for planned endo [place] as outpt only if she is markedly improved as may not be a candidate for any bx's much less rx if this is pancreatic ca at this point.  Will attempt TC for 24 hours, if tolerated then can go to SNF, if not then will require vent SNF.  Alyson Reedy, M.D. [facility] Pulmonary/Critical Care Medicine. Pager: [phone]. After hours pager: [phone].

## 2012-12-14 NOTE — Clinical Social Work Note (Signed)
CSW reviewed pt chart and discussed pt progress with RN.  CSW will continue the referral for vent SNF/ SNF with trach until pt stabilizes and a definite plan can be made re: disposition.    CSW contacted pt daughter, Mellody Life, [phone] and updated daughter re: CSW following, progress and both possible dispositions.  Daughter acknowledged understanding and agreeable to continue both dispositions in the event of stabilization (vent or trach).    Vickii Penna, LCSWA [phone]  Clinical Social Work

## 2012-12-14 NOTE — Progress Notes (Signed)
SLP Cancellation Note  Patient Details Name: [name] MRN: [id number] DOB: 1940-08-29   Cancelled treatment:        Attempted treatment with speaking valve.  She politely declined.  RR 32 when therapist arrived and fluctuated between 27 and 34.  ST will continue efforts.   Breck Coons [place].Ed [employer] [phone]  12/14/2012

## 2012-12-14 NOTE — Progress Notes (Signed)
eLink Physician-Brief Progress Note Patient Name: [name] DOB: 10/19/1940 MRN: [id number]  Date of Service  12/14/2012   HPI/Events of Note   Drop in hgb 6.8, no bleeding noted, hemodynamics wnl  eICU Interventions  Cbc at noon May need more laisx   Intervention Category Intermediate Interventions: Communication with other healthcare providers and/or family  Nelda Bucks. 12/14/2012, 5:20 AM

## 2012-12-14 NOTE — Progress Notes (Signed)
Patient seen and examined.  I have amended note of R Barrett done earlier I have seen patient a few times since admit  History of afib currently rate controlled and on coumadin. Also history of hypertrophic cardiomyopathy.  Echo on 7/28 with mid cavity/apical cavity obliteration with dynamic obstruction.  I have reviewed echo  LV cavity is very small.     Patient  remains on ventilator Intermitt with signif resp difficulties  BP 89 to 140s.  CXR with worsening patchy ASD BNP 7/29  was 33,388.  Hgb remains low  Now 7.  Cr 2.9    NA improved at 138.   I/Os since admit are positive 18 liters Question accuracy. Weights have not been taken  Patient's hemodynamics are fragile  May not tolerate drop in preload  Anemia may be exacerbating current CHF.  WIll review with CHF service in AM.  Question benefit of R heart cath.  Keep on current regimen for now. Patient remains critically ill.

## 2012-12-15 DIAGNOSIS — I421 Obstructive hypertrophic cardiomyopathy: Secondary | ICD-10-CM

## 2012-12-15 DIAGNOSIS — N289 Disorder of kidney and ureter, unspecified: Secondary | ICD-10-CM

## 2012-12-15 LAB — GLUCOSE, CAPILLARY
Glucose-Capillary: 162 mg/dL — ABNORMAL HIGH (ref 70–99)
Glucose-Capillary: 234 mg/dL — ABNORMAL HIGH (ref 70–99)
Glucose-Capillary: 170 mg/dL — ABNORMAL HIGH (ref 70–99)

## 2012-12-15 LAB — PROTIME-INR: INR: 2 — ABNORMAL HIGH (ref 0.00–1.49)

## 2012-12-15 MED ORDER — METOPROLOL TARTRATE 25 MG PO TABS
37.5000 mg | ORAL_TABLET | Freq: Four times a day (QID) | ORAL | Status: DC
  Administered 2012-12-15 – 2012-12-16 (×3): 37.5 mg via ORAL
  Filled 2012-12-15 (×8): qty 1

## 2012-12-15 NOTE — Progress Notes (Signed)
Patient ID: [name], female   DOB: 1940/12/19, 72 y.o.   MRN: [id number]    SUBJECTIVE: Patient is awake, follows some commands. Tracheostomy.  No labs yet this morning.   Marland Kitchen antiseptic oral rinse  15 mL Mouth Rinse QID  . chlorhexidine  15 mL Mouth/Throat BID  . famotidine  20 mg Per Tube Daily  . feeding supplement  30 mL Per Tube TID  . free water  250 mL Per Tube Q6H  . hydrocortisone sod succinate (SOLU-CORTEF) inj  25 mg Intravenous Q12H  . insulin aspart  0-20 Units Subcutaneous Q4H  . insulin glargine  5 Units Subcutaneous Daily  . metoprolol tartrate  37.5 mg Oral Q6H  . warfarin  2.5 mg Oral Q T,Th,S,Su-1800  . warfarin  5 mg Oral Q M,W,F-1800  . Warfarin - Pharmacist Dosing Inpatient   Does not apply q1800      Filed Vitals:   12/15/12 0430 12/15/12 0439 12/15/12 0716 12/15/12 0748  BP: 98/59  102/54 102/54  Pulse:   55 68  Temp:   98.4 F (36.9 C)   TempSrc:   Axillary   Resp:   21 27  Height:      Weight:  72.6 kg (160 lb 0.9 oz)    SpO2:   100% 98%    Intake/Output Summary (Last 24 hours) at 12/15/12 1017 Last data filed at 12/15/12 0800  Gross per 24 hour  Intake   2890 ml  Output   1175 ml  Net   1715 ml    LABS: Basic Metabolic Panel:  Recent Labs  69/62/95 0408 12/14/12 0400  NA 136 138  K 3.8 3.7  CL 101 102  CO2 23 25  GLUCOSE 274* 104*  BUN 65* 73*  CREATININE 2.54* 2.92*  CALCIUM 11.5* 11.3*  MG 1.9  --   PHOS 4.4  --    Liver Function Tests: No results found for this basename: AST, ALT, ALKPHOS, BILITOT, PROT, ALBUMIN,  in the last 72 hours No results found for this basename: LIPASE, AMYLASE,  in the last 72 hours CBC:  Recent Labs  12/14/12 0400 12/14/12 1200  WBC 12.0* 14.9*  HGB 6.8* 7.0*  HCT 20.6* 21.4*  MCV 81.7 82.0  PLT 182 236   Cardiac Enzymes: No results found for this basename: CKTOTAL, CKMB, CKMBINDEX, TROPONINI,  in the last 72 hours BNP: No components found with this basename: POCBNP,   D-Dimer: No results found for this basename: DDIMER,  in the last 72 hours Hemoglobin A1C: No results found for this basename: HGBA1C,  in the last 72 hours Fasting Lipid Panel: No results found for this basename: CHOL, HDL, LDLCALC, TRIG, CHOLHDL, LDLDIRECT,  in the last 72 hours Thyroid Function Tests: No results found for this basename: TSH, T4TOTAL, FREET3, T3FREE, THYROIDAB,  in the last 72 hours Anemia Panel: No results found for this basename: VITAMINB12, FOLATE, FERRITIN, TIBC, IRON, RETICCTPCT,  in the last 72 hours  RADIOLOGY: Dg Chest Port 1 View  12/13/2012   *RADIOLOGY REPORT*  Clinical Data: Respiratory distress  PORTABLE CHEST - 1 VIEW  Comparison: Portable chest x-ray of 12/09/2012  Findings: The tracheostomy and right PICC line appear unchanged. There is diffuse airspace disease which has worsened somewhat and may be due to pneumonia or edema.  Cardiomegaly is stable.  IMPRESSION:  1.  Worsening of patchy airspace disease.  Consider pneumonia or edema. 2.  Stable tracheostomy and right PICC line. 3.  Stable cardiomegaly  Original Report Authenticated By: Dwyane Dee, M.D.    PHYSICAL EXAM General: NAD Neck: Cannot assess JVP (trach collar), no thyromegaly or thyroid nodule.  Lungs: Dependent crackles bilaterally CV: Nondisplaced PMI.  Heart irregular S1/S2, no S3/S4, 2/6 SEM.  No peripheral edema.   Abdomen: Soft, nontender, no hepatosplenomegaly, no distention.  Neurologic: Awake, follows some commands Extremities: No clubbing or cyanosis.   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  1. Pulmonary: Respiratory failure with patchy bilateral airspace disease.  Now has trach.  There has been concern for possible aspiration PNA/pneumonitis.  I also  note that I/Os are very positive for her total stay and am concerned that there is a significant component of pulmonary edema from acute on chronic diastolic CHF in the setting of pre-existing hypertrophic cardiomyopathy.  Alternative would be an ARDS picture evolving perhaps from lung damage from aspiration pneumonitis.  - I will arrange to check CVPs as volume status very difficult to determine from exam.  - Will consider gentle diuresis but will be difficult in the face of both AKI and HCM with mid-cavity LV obstruction.  2. CHF: Suspect acute on chronic diastolic CHF in the setting of hypertrophic obstructive cardiomyopathy.  I reviewed echo, there is severe LVH with mid-cavity obliteration and very elevated mid-cavity LV gradient.  This makes diuresis difficult as a drop in preload will worsen obstruction, lower BP, etc.   - I would follow CVPs to get an idea of hemodynamics. - Aim for negative inotropy/elevated afterload as BP and HR tolerate to improve hemodynamics/lower LV mid-cavity obstruction.  To that end, I am going to stop digoxin.  I will also stop diltiazem and start metoprolol 37.5 mg every 6 hours.  Would titrate this up aggressively as BP tolerates.  After this, would begin gentle diuresis with IV Lasix if CVP shows significant elevation. If patient become hypotensive at any time in the future and requires pressors, would use phenylephrine or vasopressin and avoid beta agonism.  3. AKI on CKD: Creatinine has been stably elevated recently, awaiting today's BMET.  4. Atrial fibrillation: Rate is controlled, she is on coumadin.  5. Long term outcome likely poor, especially given possible pancreatic cancer.   Marca Ancona 12/15/2012 10:27 AM

## 2012-12-15 NOTE — Progress Notes (Signed)
Inpatient Diabetes Program Recommendations  AACE/ADA: New Consensus Statement on Inpatient Glycemic Control (2013)  Target Ranges:  Prepandial:   less than 140 mg/dL      Peak postprandial:   less than 180 mg/dL (1-2 hours)      Critically ill patients:  140 - 180 mg/dL     Results for [name], [name] (MRN [id number]) as of 12/15/2012 10:42  Ref. Range 12/14/2012 00:17 12/14/2012 04:26 12/14/2012 07:56 12/14/2012 11:59 12/14/2012 16:30 12/14/2012 20:35  Glucose-Capillary Latest Range: 70-99 mg/dL 782 (H) 956 (H) 213 (H) 326 (H) 118 (H) 162 (H)    Results for [name], [name] (MRN [id number]) as of 12/15/2012 10:42  Ref. Range 12/14/2012 23:40 12/15/2012 04:17 12/15/2012 07:42  Glucose-Capillary Latest Range: 70-99 mg/dL 629 (H) 528 (H) 413 (H)    **MD- Please consider the following in-hospital insulin adjustments to help better control CBGs  1. Decrease Novolog SSI to Moderate scale Q4 hours 2. Add scheduled tube feed coverage- Novolog 3 units Q4 hours (hold this if tube feeds held for any reason)   Will follow. Ambrose Finland RN, MSN, CDE Diabetes Coordinator Inpatient Diabetes Program [phone]

## 2012-12-15 NOTE — Progress Notes (Signed)
ANTICOAGULATION CONSULT NOTE - Follow Up Consult  Pharmacy Consult for Warfarin Indication: atrial fibrillation  No Known Allergies  Labs:  Recent Labs  12/13/12 0408 12/14/12 0400 12/14/12 1200 12/15/12 0500  HGB 7.0* 6.8* 7.0*  --   HCT 21.7* 20.6* 21.4*  --   PLT 189 182 236  --   LABPROT 20.1* 22.5*  --  22.1*  INR 1.77* 2.05*  --  2.00*  CREATININE 2.54* 2.92*  --   --     Estimated Creatinine Clearance: 16.2 ml/min (by C-G formula based on Cr of 2.92).   Assessment: 72 y/o female patient who continues on warfarin for anticoagulation with hx Afib. INR this morning is therapeutic  Goal of Therapy:  INR 2-3   Plan:  1. Coumadin 5 mg MWF, 2.5 mg TTSS 2. Will continue to monitor INR trend  Thank you. Okey Regal, PharmD [phone] 12/15/2012 8:36 AM

## 2012-12-15 NOTE — Progress Notes (Signed)
PULMONARY  / CRITICAL CARE MEDICINE  Name: [name] MRN: [id number] DOB: 03-31-1941    ADMISSION DATE:  11/09/2012 CONSULTATION DATE:  11/09/2012  REFERRING MD :  Preston Fleeting PRIMARY SERVICE: PCCM  CHIEF COMPLAINT:  Shortness of breath.  BRIEF PATIENT DESCRIPTION: 72 y/o female with CHF was admitted on 7/3 from the [facility] ED with acute hypoxemic respiratory failure due to a CHF exacerbation, ?Superimposed PNA.  LINES / TUBES: 7/3 ETT >>11/13/12, 11/13/12  (failed extubation immediately, ? aspirated) >> 7/17 7/4 CVC R IJ >> 7/17 7/5 A-line >> 7/18 7/17 Trach (DF) >> downsized to #4 8/1 > replaced with # 6 cuffed 8/2 for resp distrss 7/17 Rt PICC >>  7/22 PEG >>>  CULTURES: 7/4 U strep >> neg 7/4 legionella >> neg 7/3 mrsa PCR >> POSITIVE 7/8 mini-BAL >> yeast 7/16 sputum >> yeast  7/16 blood >> neg 7/16 urine >> yeast 50 K colonies 7/26 C. Diff>>>neg  ANTIBIOTICS: 7/3 Ceftriaxone >>7/6 7/4 levofloxacin >>7/7 7/4 Vancomycin >>7/6, restarted 7/8 >> 7/18 7/7 Unasyn >> 7/16 7/16 cefepime >> 7/24  SIGNIFICANT EVENTS: 7/3 admission 7/07 reintubated immediately after extubation.  ? Aspiration. ABX restarted.  on Neo-Synephrine.  7/09 Able to wean fio2 and neo. Lopressor required for rate control of afib. 7/10 Off neo, but now tachycardic.  Failed SBT this AM, but RT felt it was due to sedation.  Sedation weaned 7/14 Hypotensive, vomiting, amiodarone turned off for low heart rate 7/16 Change to pressure control 7/17 Start pressors, PRBC transfusion, trach 7/18 Off pressors, PRBC transfusion 7/28- 10-12 hrs trach collar trial 7/29- recs from surgery for pancreatic lesions obtained 8/2 back on vent   STUDIES: 7/16 CT chest >> emphysema, b/l ASD, small effusions 7/16 CT abd/pelvis >> 2.6 x 2 cm nodule in head of pancreas, bone changes suggestive of Paget's disease  SUBJECTIVE: Tolerating TC    VITAL SIGNS: Temp:  [97.8 F (36.6 C)-99.3 F (37.4 C)] 98.4 F (36.9 C) (08/08  0716) Pulse Rate:  [55-80] 68 (08/08 0748) Resp:  [21-29] 27 (08/08 0748) BP: (89-160)/(51-95) 102/54 mmHg (08/08 0748) SpO2:  [97 %-100 %] 98 % (08/08 0748) FiO2 (%):  [35 %-40 %] 40 % (08/08 0329) Weight:  [160 lb 0.9 oz (72.6 kg)] 160 lb 0.9 oz (72.6 kg) (08/08 0439)  HEMODYNAMICS:    VENTILATOR SETTINGS: Vent Mode:  [-] PRVC FiO2 (%):  [35 %-40 %] 40 % Set Rate:  [16 bmp] 16 bmp Vt Set:  [500 mL] 500 mL PEEP:  [5 cmH20] 5 cmH20 Pressure Support:  [12 cmH20] 12 cmH20 Plateau Pressure:  [15 cmH20-19 cmH20] 18 cmH20  INTAKE / OUTPUT: Intake/Output     08/07 0701 - 08/08 0700 08/08 0701 - 08/09 0700   I.V. (mL/kg) 500 (6.9) 20 (0.3)   NG/GT 2905 45   Total Intake(mL/kg) 3405 (46.9) 65 (0.9)   Urine (mL/kg/hr) 1225 (0.7) 100 (0.8)   Total Output 1225 100   Net +2180 -35        Stool Occurrence 1 x     PHYSICAL EXAMINATION: Gen: Lying  in bed on trach collar mod increase wob HEENT: PEG in place, trach site clean PULM: cta CV: irregular, no m/r/g AB: soft, non tender, BS+ Ext: no edema Neuro: RASS 0, follows commands, more interactive  LABS: CBC Recent Labs     12/13/12  0408  12/14/12  0400  12/14/12  1200  WBC  11.4*  12.0*  14.9*  HGB  7.0*  6.8*  7.0*  HCT  21.7*  20.6*  21.4*  PLT  189  182  236   Coag's Recent Labs     12/13/12  0408  12/14/12  0400  12/15/12  0500  INR  1.77*  2.05*  2.00*   BMET Recent Labs     12/13/12  0408  12/14/12  0400  NA  136  138  K  3.8  3.7  CL  101  102  CO2  23  25  BUN  65*  73*  CREATININE  2.54*  2.92*  GLUCOSE  274*  104*   Electrolytes Recent Labs     12/13/12  0408  12/14/12  0400  CALCIUM  11.5*  11.3*  MG  1.9   --   PHOS  4.4   --    Glucose Recent Labs     12/14/12  1159  12/14/12  1630  12/14/12  2035  12/14/12  2340  12/15/12  0417  12/15/12  0742  GLUCAP  326*  118*  162*  273*  234*  124*   CXR: 8/6 Worsening of patchy airspace disease. Consider pneumonia or   edema.   ASSESSMENT / PLAN:    PULMONARY  A:  Acute respiratory failure 2nd to pulmonary infiltrates, resolving. Effusions.  Had to go on vent overnight.  Now on TC ? Edema v. pna on 8/6 cxr P:   - PRN BD's. - Control rate to avoid diastolic CHF. - If able to tolerate TC for >24 hrs at 28% then will consider SNF placement. 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 A fib with RVR >> amiodarone d/c due to bradycardia, hypotension. Hx of CAD, HTN, hyperlipidemia. Shock >> resolved P:  - Anticoagulation managed by Cards. coumadin - Continue Cardizem PO for rate control @ 30 q6 hour, per cards - may need to change to 60mg  q 8hr for consistency   RENAL Lab Results  Component Value Date   NA 138 12/14/2012   NA 136 12/13/2012   NA 131* 12/12/2012    A: Acute kidney injury >> renal fx stable. Hypernatremia  Resolved with increase water rx Hypokalemia - resolved Hypercalcemia improving Hyperphosphatemia resolved Mag WNL P:   - dc free water - Negative fluid balance - Strict I/O - Continue NGT flushes.   GASTROINTESTINAL A:   Protein malnutrition Mass head of pancreas-- Will need further assessment of pancreatic lesion when more stable, likely in LTAC or outpatient, Ca 19-9 elevated (73.1)- concern cancer P:   - PEG tube. - Pepcid for SUP - Gen sx consulted, will order MRCP when more confident for safety on trach collar flat in MRI machine.   GYN A:  Uterine prolapse. P: - GYN consulted 7/18 >  Dx is uterine prolapse  HEMATOLOGIC  Recent Labs Lab 12/13/12 0408 12/14/12 0400 12/14/12 1200  HGB 7.0* 6.8* 7.0*    A:  Anemia of chronic disease and critical illness >> PRBC transfusion 7/17, 7/18. Thrombocytopenia - resolved  ? Dilutional anemia  Mild Leukocytosis - WBC 14.9 today, no fever. Likely hemoconcentration. P:  - Monitor CBC  - Continue coumadin  INFECTIOUS A: Initial concern for aspiration  pneumonia >> has persistent pulmonary infiltrates, and increasing WBC. P:   - No abx, follow fever curve  ENDOCRINE A:  DM2, uncontrolled since 7/31 P:   - SSI - Lantus to be increased if needing more SSI   NEUROLOGIC A: Acute  encephalopathy 2nd to hypoxia/hypercapnia. Muscular deconditioning. P:   - PT/OT, progress, especially as neuro better    Today's summary:  - awaiting placement  - Consider call GI for planned endo [place] as outpt only if she is markedly improved as may not be a candidate for any bx's much less rx if this is pancreatic ca at this point.  Patient is tolerating TC. Can go to SNF if TC is tolerated for 24 hours. If not, LTAC placement needed.   Alyson Reedy, M.D. [facility] Pulmonary/Critical Care Medicine. Pager: [phone]. After hours pager: [phone].

## 2012-12-15 NOTE — Clinical Social Work Note (Signed)
No Vent SNF beds currently available.  Per [facility], possible bed on Monday.  CSW continuing to follow.  Vickii Penna, LCSWA [phone]  Clinical Social Work

## 2012-12-16 DIAGNOSIS — I5032 Chronic diastolic (congestive) heart failure: Secondary | ICD-10-CM

## 2012-12-16 LAB — GLUCOSE, CAPILLARY
Glucose-Capillary: 142 mg/dL — ABNORMAL HIGH (ref 70–99)
Glucose-Capillary: 173 mg/dL — ABNORMAL HIGH (ref 70–99)
Glucose-Capillary: 181 mg/dL — ABNORMAL HIGH (ref 70–99)
Glucose-Capillary: 197 mg/dL — ABNORMAL HIGH (ref 70–99)
Glucose-Capillary: 248 mg/dL — ABNORMAL HIGH (ref 70–99)

## 2012-12-16 LAB — PROTIME-INR: Prothrombin Time: 22.4 seconds — ABNORMAL HIGH (ref 11.6–15.2)

## 2012-12-16 MED ORDER — ALTEPLASE 2 MG IJ SOLR
2.0000 mg | Freq: Once | INTRAMUSCULAR | Status: AC
  Administered 2012-12-16: 2 mg
  Filled 2012-12-16: qty 2

## 2012-12-16 MED ORDER — FUROSEMIDE 10 MG/ML IJ SOLN
40.0000 mg | Freq: Once | INTRAMUSCULAR | Status: AC
  Administered 2012-12-16: 40 mg via INTRAVENOUS
  Filled 2012-12-16: qty 4

## 2012-12-16 MED ORDER — FUROSEMIDE 40 MG PO TABS
40.0000 mg | ORAL_TABLET | Freq: Every day | ORAL | Status: DC
  Administered 2012-12-17 – 2012-12-22 (×6): 40 mg via ORAL
  Filled 2012-12-16 (×6): qty 1

## 2012-12-16 MED ORDER — POTASSIUM CHLORIDE ER 10 MEQ PO TBCR
20.0000 meq | EXTENDED_RELEASE_TABLET | Freq: Every day | ORAL | Status: DC
  Filled 2012-12-16: qty 2

## 2012-12-16 MED ORDER — POTASSIUM CHLORIDE 20 MEQ/15ML (10%) PO LIQD
20.0000 meq | Freq: Every day | ORAL | Status: DC
  Administered 2012-12-16 – 2012-12-22 (×7): 20 meq via ORAL
  Filled 2012-12-16 (×7): qty 15

## 2012-12-16 NOTE — Progress Notes (Signed)
ANTICOAGULATION CONSULT NOTE - Follow Up Consult  Pharmacy Consult for Warfarin Indication: atrial fibrillation  No Known Allergies  Labs:  Recent Labs  12/14/12 0400 12/14/12 1200 12/15/12 0500 12/16/12 0430  HGB 6.8* 7.0*  --   --   HCT 20.6* 21.4*  --   --   PLT 182 236  --   --   LABPROT 22.5*  --  22.1* 22.4*  INR 2.05*  --  2.00* 2.04*  CREATININE 2.92*  --   --   --     Estimated Creatinine Clearance: 16.4 ml/min (by C-G formula based on Cr of 2.92).   Assessment: 72 y/o female patient who continues on warfarin for anticoagulation with hx Afib. INR this morning is therapeutic and has been at goal for 3 days.  Goal of Therapy:  INR 2-3   Plan:  1. Coumadin 5 mg MWF, 2.5 mg TTSS 2. PT/INR MWF  Harland German, Pharm D 12/16/2012 7:17 AM

## 2012-12-16 NOTE — Progress Notes (Signed)
PULMONARY  / CRITICAL CARE MEDICINE  Name: [name] MRN: [id number] DOB: 1940/05/14    ADMISSION DATE:  11/09/2012 CONSULTATION DATE:  11/09/2012  REFERRING MD :  Preston Fleeting PRIMARY SERVICE: PCCM  CHIEF COMPLAINT:  Shortness of breath.  BRIEF PATIENT DESCRIPTION: 72 y/o female with CHF was admitted on 7/3 from the [facility] ED with acute hypoxemic respiratory failure due to a CHF exacerbation, ?Superimposed PNA.  LINES / TUBES: 7/3 ETT >>11/13/12, 11/13/12  (failed extubation immediately, ? aspirated) >> 7/17 7/4 CVC R IJ >> 7/17 7/5 A-line >> 7/18 7/17 Trach (DF) >> downsized to #4 8/1 > replaced with # 6 cuffed 8/2 for resp distrss 7/17 Rt PICC >>  7/22 PEG >>>  CULTURES: 7/4 U strep >> neg 7/4 legionella >> neg 7/3 mrsa PCR >> POSITIVE 7/8 mini-BAL >> yeast 7/16 sputum >> yeast  7/16 blood >> neg 7/16 urine >> yeast 50 K colonies 7/26 C. Diff>>>neg  ANTIBIOTICS: 7/3 Ceftriaxone >>7/6 7/4 levofloxacin >>7/7 7/4 Vancomycin >>7/6, restarted 7/8 >> 7/18 7/7 Unasyn >> 7/16 7/16 cefepime >> 7/24  SIGNIFICANT EVENTS: 7/3 admission 7/07 reintubated immediately after extubation.  ? Aspiration. ABX restarted.  on Neo-Synephrine.  7/09 Able to wean fio2 and neo. Lopressor required for rate control of afib. 7/10 Off neo, but now tachycardic.  Failed SBT this AM, but RT felt it was due to sedation.  Sedation weaned 7/14 Hypotensive, vomiting, amiodarone turned off for low heart rate 7/16 Change to pressure control 7/17 Start pressors, PRBC transfusion, trach 7/18 Off pressors, PRBC transfusion 7/28- 10-12 hrs trach collar trial 7/29- recs from surgery for pancreatic lesions obtained 8/2 back on vent   STUDIES: 7/16 CT chest >> emphysema, b/l ASD, small effusions 7/16 CT abd/pelvis >> 2.6 x 2 cm nodule in head of pancreas, bone changes suggestive of Paget's disease  SUBJECTIVE: Tolerating TC    VITAL SIGNS: Temp:  [97.6 F (36.4 C)-98.8 F (37.1 C)] 98 F (36.7 C) (08/09  0710) Pulse Rate:  [51-75] 64 (08/09 0741) Resp:  [16-29] 21 (08/09 0741) BP: (93-130)/(54-99) 93/54 mmHg (08/09 0710) SpO2:  [96 %-100 %] 100 % (08/09 0741) FiO2 (%):  [40 %] 40 % (08/09 0800) Weight:  [74 kg (163 lb 2.3 oz)] 74 kg (163 lb 2.3 oz) (08/09 0409)  HEMODYNAMICS: CVP:  [6 mmHg-12 mmHg] 12 mmHg  VENTILATOR SETTINGS: Vent Mode:  [-] PRVC FiO2 (%):  [40 %] 40 % Set Rate:  [16 bmp] 16 bmp Vt Set:  [500 mL] 500 mL PEEP:  [5 cmH20] 5 cmH20 Plateau Pressure:  [16 cmH20-19 cmH20] 18 cmH20  INTAKE / OUTPUT: Intake/Output     08/08 0701 - 08/09 0700 08/09 0701 - 08/10 0700   I.V. (mL/kg) 220 (3) 280 (3.8)   NG/GT 745    Total Intake(mL/kg) 965 (13) 280 (3.8)   Urine (mL/kg/hr) 1200 (0.7) 115 (0.5)   Total Output 1200 115   Net -235 +165        Stool Occurrence 4 x 1 x    PHYSICAL EXAMINATION: Gen: Lying  in bed on trach collar mod increase wob HEENT: PEG in place, trach site clean PULM: cta CV: irregular, no m/r/g AB: soft, non tender, BS+ Ext: no edema Neuro: RASS 0, follows commands, more interactive  LABS: CBC Recent Labs     12/14/12  0400  12/14/12  1200  WBC  12.0*  14.9*  HGB  6.8*  7.0*  HCT  20.6*  21.4*  PLT  182  236   Coag's Recent Labs     12/14/12  0400  12/15/12  0500  12/16/12  0430  INR  2.05*  2.00*  2.04*   BMET Recent Labs     12/14/12  0400  NA  138  K  3.7  CL  102  CO2  25  BUN  73*  CREATININE  2.92*  GLUCOSE  104*   Electrolytes Recent Labs     12/14/12  0400  CALCIUM  11.3*   Glucose Recent Labs     12/15/12  1126  12/15/12  1639  12/15/12  1946  12/15/12  2358  12/16/12  0401  12/16/12  0747  GLUCAP  260*  225*  170*  173*  197*  173*   CXR: 8/6 Worsening of patchy airspace disease. Consider pneumonia or  edema.   ASSESSMENT / PLAN:    PULMONARY  A:  Acute respiratory failure 2nd to pulmonary infiltrates, resolving. Effusions.  Had to go on vent overnight.  Now on TC ? Edema v. pna on 8/6  cxr P:   - PRN BD's. - Control rate to avoid diastolic CHF. - If able to tolerate TC for >24 hrs at 28% then will consider SNF placement. 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 A fib with RVR >> amiodarone d/c due to bradycardia, hypotension. Hx of CAD, HTN, hyperlipidemia. Shock >> resolved P:  - Anticoagulation managed by Cards. coumadin - Continue Cardizem PO for rate control @ 30 q6 hour, per cards - may need to change to 60mg  q 8hr for consistency   RENAL Lab Results  Component Value Date   NA 138 12/14/2012   NA 136 12/13/2012   NA 131* 12/12/2012    A: Acute kidney injury >> renal fx stable. Hypernatremia  Resolved with increase water rx Hypokalemia - resolved Hypercalcemia improving Hyperphosphatemia resolved Mag WNL P:   - dc free water - Negative fluid balance - Strict I/O - Continue NGT flushes.   GASTROINTESTINAL A:   Protein malnutrition Mass head of pancreas-- Will need further assessment of pancreatic lesion when more stable, likely in LTAC or outpatient, Ca 19-9 elevated (73.1)- concern cancer P:   - PEG tube. - Pepcid for SUP - Gen sx consulted, will order MRCP when more confident for safety on trach collar flat in MRI machine.   GYN A:  Uterine prolapse. P: - GYN consulted 7/18 >  Dx is uterine prolapse  HEMATOLOGIC  Recent Labs Lab 12/13/12 0408 12/14/12 0400 12/14/12 1200  HGB 7.0* 6.8* 7.0*    A:  Anemia of chronic disease and critical illness >> PRBC transfusion 7/17, 7/18. Thrombocytopenia - resolved  ? Dilutional anemia  Mild Leukocytosis - WBC 14.9 today, no fever. Likely hemoconcentration. P:  - Monitor CBC  - Continue coumadin  INFECTIOUS A: Initial concern for aspiration pneumonia >> has persistent pulmonary infiltrates, and increasing WBC. P:   - No abx, follow fever curve  ENDOCRINE A:  DM2, uncontrolled since 7/31 P:   - SSI - Lantus to be increased if needing  more SSI   NEUROLOGIC A: Acute encephalopathy 2nd to hypoxia/hypercapnia. Muscular deconditioning. P:   - PT/OT, progress, especially as neuro better     Today's summary:  - awaiting placement  - Consider call GI for planned endo [place] as outpt only if she is markedly improved as may not be a candidate for any bx's much less  rx if this is pancreatic ca at this point.  Patient is tolerating TC. Can go to SNF if TC is tolerated for 24 hours. If not, LTAC placement needed.    Lonzo Cloud. Kriste Basque, MD [facility] Pulmonary Medicine. 12/16/12 @ 10:13AM

## 2012-12-16 NOTE — Progress Notes (Addendum)
Patient ID: [name], female   DOB: 11-Sep-1940, 72 y.o.   MRN: [id number]    SUBJECTIVE:   Cardizem switched to metoprolol yesterday. Awake. Appears comfortable. Denies pain but won't speak otherwise.   No BMET x 2 days. Receiving free water bolus via tube.   CVP checked personally = 11  . antiseptic oral rinse  15 mL Mouth Rinse QID  . chlorhexidine  15 mL Mouth/Throat BID  . famotidine  20 mg Per Tube Daily  . feeding supplement  30 mL Per Tube TID  . free water  250 mL Per Tube Q6H  . hydrocortisone sod succinate (SOLU-CORTEF) inj  25 mg Intravenous Q12H  . insulin aspart  0-20 Units Subcutaneous Q4H  . insulin glargine  5 Units Subcutaneous Daily  . metoprolol tartrate  37.5 mg Oral Q6H  . warfarin  2.5 mg Oral Q T,Th,S,Su-1800  . warfarin  5 mg Oral Q M,W,F-1800  . Warfarin - Pharmacist Dosing Inpatient   Does not apply q1800      Filed Vitals:   12/16/12 0409 12/16/12 0502 12/16/12 0710 12/16/12 0741  BP: 130/64 130/64 93/54   Pulse: 57 51 53 64  Temp: 98.7 F (37.1 C)  98 F (36.7 C)   TempSrc: Axillary  Axillary   Resp: 20  16 21   Height:      Weight: 74 kg (163 lb 2.3 oz)     SpO2: 100%  100% 100%    Intake/Output Summary (Last 24 hours) at 12/16/12 1124 Last data filed at 12/16/12 0800  Gross per 24 hour  Intake    985 ml  Output   1215 ml  Net   -230 ml    LABS: Basic Metabolic Panel:  Recent Labs  65/78/46 0400  NA 138  K 3.7  CL 102  CO2 25  GLUCOSE 104*  BUN 73*  CREATININE 2.92*  CALCIUM 11.3*   Liver Function Tests: No results found for this basename: AST, ALT, ALKPHOS, BILITOT, PROT, ALBUMIN,  in the last 72 hours No results found for this basename: LIPASE, AMYLASE,  in the last 72 hours CBC:  Recent Labs  12/14/12 0400 12/14/12 1200  WBC 12.0* 14.9*  HGB 6.8* 7.0*  HCT 20.6* 21.4*  MCV 81.7 82.0  PLT 182 236   Cardiac Enzymes: No results found for this basename: CKTOTAL, CKMB, CKMBINDEX, TROPONINI,  in the last 72  hours BNP: No components found with this basename: POCBNP,  D-Dimer: No results found for this basename: DDIMER,  in the last 72 hours Hemoglobin A1C: No results found for this basename: HGBA1C,  in the last 72 hours Fasting Lipid Panel: No results found for this basename: CHOL, HDL, LDLCALC, TRIG, CHOLHDL, LDLDIRECT,  in the last 72 hours Thyroid Function Tests: No results found for this basename: TSH, T4TOTAL, FREET3, T3FREE, THYROIDAB,  in the last 72 hours Anemia Panel: No results found for this basename: VITAMINB12, FOLATE, FERRITIN, TIBC, IRON, RETICCTPCT,  in the last 72 hours  RADIOLOGY: Dg Chest Port 1 View  12/13/2012   *RADIOLOGY REPORT*  Clinical Data: Respiratory distress  PORTABLE CHEST - 1 VIEW  Comparison: Portable chest x-ray of 12/09/2012  Findings: The tracheostomy and right PICC line appear unchanged. There is diffuse airspace disease which has worsened somewhat and may be due to pneumonia or edema.  Cardiomegaly is stable.  IMPRESSION:  1.  Worsening of patchy airspace disease.  Consider pneumonia or edema. 2.  Stable tracheostomy and right PICC line. 3.  Stable cardiomegaly   Original Report Authenticated By: Dwyane Dee, M.D.    PHYSICAL EXAM General: NAD Neck: CVP 11. Trach collar Lungs: clear anteriorly CV: Nondisplaced PMI.  Heart irregular S1/S2, no S3/S4, 2/6 SEM.  No peripheral edema.   Abdomen: Soft, nontender, no hepatosplenomegaly, no distention. + G-tube. Neurologic: Awake, follows some commands Extremities: No clubbing or cyanosis. 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  1.  Respiratory failure with patchy bilateral airspace disease s/p trach.   2. Suspect acute on chronic diastolic CHF in the setting of hypertrophic  obstructive cardiomyopathy. 3. AKI on CKD: Creatinine has been stably elevated recently 4. Atrial fibrillation: Rate is controlled, she is on coumadin.  5. Long term outcome likely poor, especially given possible pancreatic cancer.   PLAN:  Overall seems stable. Volume status slightly elevated. I will give her once dose of lasix today being careful not to drop her preload too much in setting of HOCM. Will then resume home dose of lasix 40 po daily.   Question for primary team - can we decrease volume of G-tube flushes?   Agree with LTAC.  We will see again Monday. Call with questions.   Reuel Boom Markeem Noreen 12/16/2012 11:24 AM

## 2012-12-17 ENCOUNTER — Inpatient Hospital Stay (HOSPITAL_COMMUNITY): Payer: Medicare Other

## 2012-12-17 LAB — CBC WITH DIFFERENTIAL/PLATELET
Basophils Absolute: 0 10*3/uL (ref 0.0–0.1)
Eosinophils Absolute: 0.1 10*3/uL (ref 0.0–0.7)
HCT: 20.5 % — ABNORMAL LOW (ref 36.0–46.0)
Lymphocytes Relative: 11 % — ABNORMAL LOW (ref 12–46)
Lymphs Abs: 1.2 10*3/uL (ref 0.7–4.0)
MCHC: 31.7 g/dL (ref 30.0–36.0)
Neutro Abs: 8.7 10*3/uL — ABNORMAL HIGH (ref 1.7–7.7)
Platelets: 214 10*3/uL (ref 150–400)
RDW: 19.6 % — ABNORMAL HIGH (ref 11.5–15.5)

## 2012-12-17 LAB — GLUCOSE, CAPILLARY
Glucose-Capillary: 166 mg/dL — ABNORMAL HIGH (ref 70–99)
Glucose-Capillary: 167 mg/dL — ABNORMAL HIGH (ref 70–99)
Glucose-Capillary: 234 mg/dL — ABNORMAL HIGH (ref 70–99)

## 2012-12-17 LAB — COMPREHENSIVE METABOLIC PANEL
Albumin: 2.4 g/dL — ABNORMAL LOW (ref 3.5–5.2)
BUN: 83 mg/dL — ABNORMAL HIGH (ref 6–23)
Creatinine, Ser: 2.55 mg/dL — ABNORMAL HIGH (ref 0.50–1.10)
Potassium: 3.4 mEq/L — ABNORMAL LOW (ref 3.5–5.1)
Total Protein: 5.9 g/dL — ABNORMAL LOW (ref 6.0–8.3)

## 2012-12-17 LAB — PREPARE RBC (CROSSMATCH)

## 2012-12-17 MED ORDER — METOPROLOL TARTRATE 25 MG PO TABS
37.5000 mg | ORAL_TABLET | Freq: Four times a day (QID) | ORAL | Status: DC
  Administered 2012-12-17 – 2012-12-18 (×4): 37.5 mg via ORAL
  Filled 2012-12-17 (×13): qty 1

## 2012-12-17 MED ORDER — FUROSEMIDE 10 MG/ML IJ SOLN
INTRAMUSCULAR | Status: AC
  Filled 2012-12-17: qty 4

## 2012-12-17 MED ORDER — FUROSEMIDE 10 MG/ML IJ SOLN
40.0000 mg | Freq: Once | INTRAMUSCULAR | Status: AC
  Administered 2012-12-17: 40 mg via INTRAVENOUS

## 2012-12-17 NOTE — Progress Notes (Signed)
Pt HR increasing up into the 160s, resps 40s-50s, pt working very hard to breathe.  E-link MD camera in.  Ok to give 2mg  versed. Pt almost immediately started to breathe better and HR/resps start to come down.   Will continue to monitor.  Salomon Mast, RN

## 2012-12-17 NOTE — Progress Notes (Signed)
Walked into pts room and saw that she was half way off the bed.   HR up to the 90s, BP 155/89 (typically low 100s to high 90s sys.), Resps mid 30s, pt appeared to be anxious.  Blood transfusion running at 125/hr.    Stopped blood, paged E-link.  Spoke with Linn at [employer]. Told her that I also held 1200 dose of metoprolol 37.5mg  d/t sys <100.  Says she will page Dr. Herma Carson and call back.  Dr. Herma Carson called back, said to resume blood.  Will continue to monitor.  Salomon Mast, RN

## 2012-12-17 NOTE — Progress Notes (Signed)
At 2200 pt's bp was 91/52 and her heart rate had been sustaining afib 54-58 with non-sustained into the 60's and as low as 44.  Pt appears to be asymptomatic.  Called PCCM to notify MD of above info and for medicatio parameters, particularly for Metoprolol 37.5mg .  Parameters and an order to hold the 2200 dose were given by MD.

## 2012-12-17 NOTE — Progress Notes (Signed)
eLink Physician-Brief Progress Note Patient Name: JAELY SILMAN DOB: Feb 19, 1941 MRN: [id number]  Date of Service  12/17/2012   HPI/Events of Note   Respiratory distress Recently transfused   eICU Interventions   Lasix Fentanyl    Intervention Category Major Interventions: Respiratory failure - evaluation and management  Dartanyan Deasis 12/17/2012, 6:02 PM

## 2012-12-17 NOTE — Progress Notes (Signed)
PULMONARY  / CRITICAL CARE MEDICINE  Name: [name] MRN: [id number] DOB: [birth year]    ADMISSION DATE:  11/09/2012 CONSULTATION DATE:  11/09/2012  REFERRING MD :  Preston Fleeting PRIMARY SERVICE: PCCM  CHIEF COMPLAINT:  Shortness of breath.  BRIEF PATIENT DESCRIPTION: 72 y/o female with CHF was admitted on 7/3 from the [facility] ED with acute hypoxemic respiratory failure due to a CHF exacerbation, ?Superimposed PNA.  LINES / TUBES: 7/3 ETT >>11/13/12, 11/13/12  (failed extubation immediately, ? aspirated) >> 7/17 7/4 CVC R IJ >> 7/17 7/5 A-line >> 7/18 7/17 Trach (DF) >> downsized to #4 8/1 > replaced with # 6 cuffed 8/2 for resp distrss 7/17 Rt PICC >>  7/22 PEG >>>  CULTURES: 7/4 U strep >> neg 7/4 legionella >> neg 7/3 mrsa PCR >> POSITIVE 7/8 mini-BAL >> yeast 7/16 sputum >> yeast  7/16 blood >> neg 7/16 urine >> yeast 50 K colonies 7/26 C. Diff>>>neg  ANTIBIOTICS: 7/3 Ceftriaxone >>7/6 7/4 levofloxacin >>7/7 7/4 Vancomycin >>7/6, restarted 7/8 >> 7/18 7/7 Unasyn >> 7/16 7/16 cefepime >> 7/24  SIGNIFICANT EVENTS: 7/3 admission 7/07 reintubated immediately after extubation.  ? Aspiration. ABX restarted.  on Neo-Synephrine.  7/09 Able to wean fio2 and neo. Lopressor required for rate control of afib. 7/10 Off neo, but now tachycardic.  Failed SBT this AM, but RT felt it was due to sedation.  Sedation weaned 7/14 Hypotensive, vomiting, amiodarone turned off for low heart rate 7/16 Change to pressure control 7/17 Start pressors, PRBC transfusion, trach 7/18 Off pressors, PRBC transfusion 7/28- 10-12 hrs trach collar trial 7/29- recs from surgery for pancreatic lesions obtained 8/2 back on vent   STUDIES: 7/16 CT chest >> emphysema, b/l ASD, small effusions 7/16 CT abd/pelvis >> 2.6 x 2 cm nodule in head of pancreas, bone changes suggestive of Paget's disease  SUBJECTIVE: Tolerating TC    VITAL SIGNS: Temp:  [97.6 F (36.4 C)-99.6 F (37.6 C)] 99.1 F (37.3 C) (08/10  0800) Pulse Rate:  [53-87] 79 (08/10 0800) Resp:  [17-32] 27 (08/10 0800) BP: (91-116)/(50-65) 103/59 mmHg (08/10 0800) SpO2:  [97 %-100 %] 100 % (08/10 0805) FiO2 (%):  [30 %-40 %] 35 % (08/10 0805) Weight:  [73.4 kg (161 lb 13.1 oz)] 73.4 kg (161 lb 13.1 oz) (08/10 0500)  HEMODYNAMICS: CVP:  [7 mmHg-20 mmHg] 14 mmHg  VENTILATOR SETTINGS: Vent Mode:  [-] PRVC FiO2 (%):  [30 %-40 %] 35 % Set Rate:  [16 bmp] 16 bmp Vt Set:  [500 mL] 500 mL PEEP:  [5 cmH20] 5 cmH20 Plateau Pressure:  [15 cmH20-22 cmH20] 15 cmH20  INTAKE / OUTPUT: Intake/Output     08/09 0701 - 08/10 0700 08/10 0701 - 08/11 0700   I.V. (mL/kg) 740 (10.1) 40 (0.5)   NG/GT 2165 90   Total Intake(mL/kg) 2905 (39.6) 130 (1.8)   Urine (mL/kg/hr) 1200 (0.7)    Total Output 1200     Net +1705 +130        Stool Occurrence 2 x     PHYSICAL EXAMINATION: Gen: Lying  in bed on trach collar mod increase wob HEENT: PEG in place, trach site clean PULM: cta CV: irregular, no m/r/g AB: soft, non tender, BS+ Ext: no edema Neuro: RASS 0, follows commands, more interactive  LABS: CBC Recent Labs     12/14/12  1200  12/17/12  0500  WBC  14.9*  10.6*  HGB  7.0*  6.5*  HCT  21.4*  20.5*  PLT  236  214   Coag's Recent Labs     12/15/12  0500  12/16/12  0430  INR  2.00*  2.04*   BMET Recent Labs     12/17/12  0500  NA  136  K  3.4*  CL  99  CO2  25  BUN  83*  CREATININE  2.55*  GLUCOSE  183*   Electrolytes Recent Labs     12/17/12  0500  CALCIUM  11.1*   Glucose Recent Labs     12/16/12  1159  12/16/12  1614  12/16/12  2034  12/16/12  2340  12/17/12  0357  12/17/12  0758  GLUCAP  248*  142*  199*  181*  166*  167*   CXR: 8/6 Worsening of patchy airspace disease. Consider pneumonia or  edema.   ASSESSMENT / PLAN:    PULMONARY  A:  Acute respiratory failure 2nd to pulmonary infiltrates, resolving. Effusions.  ? Edema v. pna on 8/6 cxr P:   - PRN BD's. - Control rate to avoid  diastolic CHF. - If able to tolerate TC for >24 hrs at 28% then will consider SNF placement. 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 A fib with RVR >> amiodarone d/c due to bradycardia, hypotension. Hx of CAD, HTN, hyperlipidemia. Shock >> resolved P:  - Anticoagulation managed by Cards. coumadin - Continue Cardizem PO for rate control @ 30 q6 hour, per cards - may need to change to 60mg  q 8hr for consistency   RENAL Lab Results  Component Value Date   NA 136 12/17/2012   NA 138 12/14/2012   NA 136 12/13/2012    A: Acute kidney injury >> renal fx stable. Hypernatremia  Resolved with increase water rx Hypokalemia - resolved Hypercalcemia improving Hyperphosphatemia resolved Mag WNL P:   - dc free water - Negative fluid balance - Strict I/O - Continue NGT flushes.   GASTROINTESTINAL A:   Protein malnutrition Mass head of pancreas-- Will need further assessment of pancreatic lesion when more stable, likely in LTAC or outpatient, Ca 19-9 elevated (73.1)- concern cancer P:   - PEG tube. - Pepcid for SUP - Gen sx consulted, will order MRCP when more confident for safety on trach collar flat in MRI machine.   GYN A:  Uterine prolapse. P: - GYN consulted 7/18 >  Dx is uterine prolapse  HEMATOLOGIC  Recent Labs Lab 12/14/12 0400 12/14/12 1200 12/17/12 0500  HGB 6.8* 7.0* 6.5*    A:  Anemia of chronic disease and critical illness >> PRBC transfusion 7/17, 7/18. Thrombocytopenia - resolved  ? Dilutional anemia  Mild Leukocytosis - WBC 14.9 today, no fever. Likely hemoconcentration. P:  - Monitor CBC  - Continue coumadin for AFib 8/10> Hg=6.5 will tx 1u  INFECTIOUS A: Initial concern for aspiration pneumonia >> has persistent pulmonary infiltrates, and increasing WBC. P:   - No abx, follow fever curve  ENDOCRINE A:  DM2, uncontrolled since 7/31 P:   - SSI - Lantus to be increased if needing more  SSI   NEUROLOGIC A: Acute encephalopathy 2nd to hypoxia/hypercapnia. Muscular deconditioning. P:   - PT/OT, progress, especially as neuro better     Today's summary:  - awaiting placement  - Consider call GI for planned endo [place] as outpt only if she is markedly improved as may not be a candidate for any bx's much less rx if this is pancreatic ca at this point.  Patient is tolerating TC. Can go to SNF if TC is tolerated for 24 hours. If not, LTAC placement needed.    Lonzo Cloud. Kriste Basque, MD [facility] Pulmonary Medicine. 12/17/12 @ 9:24AM

## 2012-12-17 NOTE — Significant Event (Signed)
CRITICAL VALUE ALERT  Critical value received:  hgb 6.5  Date of notification: 12/17/12  Time of notification: 0620  Critical value read back: yes  Nurse who received alert: Jeanmarie Plant RN  MD notified (1st page):  Dr. Belinda Block, called direct to PCCM on call  Time MD notified and responded: [phone]

## 2012-12-18 LAB — GLUCOSE, CAPILLARY
Glucose-Capillary: 126 mg/dL — ABNORMAL HIGH (ref 70–99)
Glucose-Capillary: 132 mg/dL — ABNORMAL HIGH (ref 70–99)
Glucose-Capillary: 174 mg/dL — ABNORMAL HIGH (ref 70–99)
Glucose-Capillary: 215 mg/dL — ABNORMAL HIGH (ref 70–99)
Glucose-Capillary: 245 mg/dL — ABNORMAL HIGH (ref 70–99)

## 2012-12-18 LAB — COMPREHENSIVE METABOLIC PANEL
ALT: 36 U/L — ABNORMAL HIGH (ref 0–35)
AST: 32 U/L (ref 0–37)
Calcium: 11.3 mg/dL — ABNORMAL HIGH (ref 8.4–10.5)
Creatinine, Ser: 2.4 mg/dL — ABNORMAL HIGH (ref 0.50–1.10)
GFR calc Af Amer: 22 mL/min — ABNORMAL LOW (ref >90)
Glucose, Bld: 131 mg/dL — ABNORMAL HIGH (ref 70–99)
Sodium: 138 mEq/L (ref 135–145)
Total Protein: 6.2 g/dL (ref 6.0–8.3)

## 2012-12-18 LAB — CBC
MCH: 27.9 pg (ref 26.0–34.0)
MCHC: 32.8 g/dL (ref 30.0–36.0)
MCV: 85.2 fL (ref 78.0–100.0)
Platelets: 219 10*3/uL (ref 150–400)

## 2012-12-18 LAB — TYPE AND SCREEN
ABO/RH(D): A POS
Antibody Screen: NEGATIVE

## 2012-12-18 LAB — PROTIME-INR: Prothrombin Time: 21.8 seconds — ABNORMAL HIGH (ref 11.6–15.2)

## 2012-12-18 NOTE — Progress Notes (Signed)
NUTRITION FOLLOW UP  Intervention:    Continue current EN regimen RD to follow for nutrition care plan  Nutrition Dx:   Inadequate oral intake related to inability to eat as evidenced by NPO status, ongoing  Goal:   EN to meet > 90% of estimated nutrition needs, met  Monitor:   EN regimen & tolerance, respiratory status, weight, labs, I/O's  Assessment:   Patient with hx of CHF admitted from [facility] ED with acute hypoxemic respiratory failure due to CHF exacerbation.   Patient is currently on ventilator support  MV: 17.6 Temp: 36.7  S/p FEES 7/31 ---> SLP recommending continued NPO status. Trach changed 8/1. Will likely need LTACH.   Osmolite 1.2 formula infusing at 45 ml/hr via PEG tube with Prostat liquid protein 30 ml 3 times daily providing 1596 total kcals, 105 gm protein, 885 ml of free water. Free water flushes at 250 ml every 6 hours.   RD with EN management privileges, initially consulted 7/4.  Height: Ht Readings from Last 1 Encounters:  11/09/12 5\' 2"  (1.575 m)    Weight Status:   Wt Readings from Last 1 Encounters:  12/17/12 161 lb 13.1 oz (73.4 kg)    Body mass index is 29.59 kg/(m^2).  Re-estimated needs:  Kcal: 1600-1750 Protein: 95-105 gm Fluid: >/= 1.5 L  Skin: Intact  Diet Order: NPO   Intake/Output Summary (Last 24 hours) at 12/18/12 1445 Last data filed at 12/18/12 0900  Gross per 24 hour  Intake   1335 ml  Output    700 ml  Net    635 ml    Labs:   Recent Labs Lab 12/12/12 0442 12/13/12 0408 12/14/12 0400 12/17/12 0500 12/18/12 0500  NA 131* 136 138 136 138  K 3.6 3.8 3.7 3.4* 3.2*  CL 97 101 102 99 100  CO2 21 23 25 25 25   BUN 66* 65* 73* 83* 81*  CREATININE 2.50* 2.54* 2.92* 2.55* 2.40*  CALCIUM 11.1* 11.5* 11.3* 11.1* 11.3*  MG 2.0 1.9  --   --   --   PHOS 4.9* 4.4  --   --   --   GLUCOSE 327* 274* 104* 183* 131*    CBG (last 3)   Recent Labs  12/18/12 0354 12/18/12 0745 12/18/12 1220  GLUCAP 132* 126* 245*     Scheduled Meds: . antiseptic oral rinse  15 mL Mouth Rinse QID  . chlorhexidine  15 mL Mouth/Throat BID  . famotidine  20 mg Per Tube Daily  . feeding supplement  30 mL Per Tube TID  . free water  250 mL Per Tube Q6H  . furosemide  40 mg Oral Daily  . hydrocortisone sod succinate (SOLU-CORTEF) inj  25 mg Intravenous Q12H  . insulin aspart  0-20 Units Subcutaneous Q4H  . insulin glargine  5 Units Subcutaneous Daily  . metoprolol tartrate  37.5 mg Oral Q6H  . potassium chloride  20 mEq Oral Daily  . warfarin  2.5 mg Oral Q T,Th,S,Su-1800  . warfarin  5 mg Oral Q M,W,F-1800  . Warfarin - Pharmacist Dosing Inpatient   Does not apply q1800    Continuous Infusions: . dextrose 20 mL/hr at 12/18/12 0900  . feeding supplement (OSMOLITE 1.2 CAL) 1,000 mL (12/17/12 1610)    Maureen Chatters, RD, LDN Pager #: [phone] After-Hours Pager #: [phone]

## 2012-12-18 NOTE — Progress Notes (Signed)
PULMONARY  / CRITICAL CARE MEDICINE  Name: [name] MRN: [id number] DOB: 29-May-1940    ADMISSION DATE:  11/09/2012 CONSULTATION DATE:  11/09/2012  REFERRING MD :  Preston Fleeting PRIMARY SERVICE: PCCM  CHIEF COMPLAINT:  Shortness of breath.  BRIEF PATIENT DESCRIPTION: 72 y/o female with CHF was admitted on 7/3 from the [facility] ED with acute hypoxemic respiratory failure due to a CHF exacerbation, ?Superimposed PNA.  LINES / TUBES: 7/3 ETT >>11/13/12, 11/13/12  (failed extubation immediately, ? aspirated) >> 7/17 7/4 CVC R IJ >> 7/17 7/5 A-line >> 7/18 7/17 Trach (DF) >> downsized to #4 8/1 > replaced with # 6 cuffed 8/2 for resp distrss 7/17 Rt PICC >>  7/22 PEG >>>  CULTURES: 7/4 U strep >> neg 7/4 legionella >> neg 7/3 mrsa PCR >> POSITIVE 7/8 mini-BAL >> yeast 7/16 sputum >> yeast  7/16 blood >> neg 7/16 urine >> yeast 50 K colonies 7/26 C. Diff>>>neg  ANTIBIOTICS: 7/3 Ceftriaxone >>7/6 7/4 levofloxacin >>7/7 7/4 Vancomycin >>7/6, restarted 7/8 >> 7/18 7/7 Unasyn >> 7/16 7/16 cefepime >> 7/24  SIGNIFICANT EVENTS: 7/3 admission 7/07 reintubated immediately after extubation.  ? Aspiration. ABX restarted.  on Neo-Synephrine.  7/09 Able to wean fio2 and neo. Lopressor required for rate control of afib. 7/10 Off neo, but now tachycardic.  Failed SBT this AM, but RT felt it was due to sedation.  Sedation weaned 7/14 Hypotensive, vomiting, amiodarone turned off for low heart rate 7/16 Change to pressure control 7/17 Start pressors, PRBC transfusion, trach 7/18 Off pressors, PRBC transfusion 7/28- 10-12 hrs trach collar trial 7/29- recs from surgery for pancreatic lesions obtained 8/2 back on vent   STUDIES: 7/16 CT chest >> emphysema, b/l ASD, small effusions 7/16 CT abd/pelvis >> 2.6 x 2 cm nodule in head of pancreas, bone changes suggestive of Paget's disease  SUBJECTIVE: Tolerating TC    VITAL SIGNS: Temp:  [97.8 F (36.6 C)-99.5 F (37.5 C)] 97.8 F (36.6 C) (08/11  0817) Pulse Rate:  [52-96] 77 (08/11 0817) Resp:  [13-32] 22 (08/11 0800) BP: (93-130)/(53-82) 120/67 mmHg (08/11 0817) SpO2:  [97 %-100 %] 99 % (08/11 0840) FiO2 (%):  [30 %-40 %] 40 % (08/11 0900)  HEMODYNAMICS: CVP:  [0 mmHg-21 mmHg] 14 mmHg  VENTILATOR SETTINGS: Vent Mode:  [-] PRVC FiO2 (%):  [30 %-40 %] 40 % Set Rate:  [16 bmp] 16 bmp Vt Set:  [500 mL] 500 mL PEEP:  [5 cmH20] 5 cmH20 Plateau Pressure:  [16 cmH20-20 cmH20] 16 cmH20  INTAKE / OUTPUT: Intake/Output     08/10 0701 - 08/11 0700 08/11 0701 - 08/12 0700   I.V. (mL/kg) 480 (6.5) 20 (0.3)   Blood 312.5    NG/GT 1080 210   Total Intake(mL/kg) 1872.5 (25.5) 230 (3.1)   Urine (mL/kg/hr) 951 (0.5)    Stool 1 (0)    Total Output 952     Net +920.5 +230        Urine Occurrence 4 x    Stool Occurrence 8 x     PHYSICAL EXAMINATION: Gen: Lying  in bed on trach collar mod increase wob HEENT: PEG in place, trach site clean PULM: cta CV: irregular, no m/r/g AB: soft, non tender, BS+ Ext: no edema Neuro: RASS 0, follows commands, more interactive  LABS: CBC Recent Labs     12/17/12  0500  12/18/12  0500  WBC  10.6*  12.5*  HGB  6.5*  8.3*  HCT  20.5*  25.3*  PLT  214  219   Coag's Recent Labs     12/16/12  0430  12/18/12  0500  INR  2.04*  1.97*   BMET Recent Labs     12/17/12  0500  12/18/12  0500  NA  136  138  K  3.4*  3.2*  CL  99  100  CO2  25  25  BUN  83*  81*  CREATININE  2.55*  2.40*  GLUCOSE  183*  131*   Electrolytes Recent Labs     12/17/12  0500  12/18/12  0500  CALCIUM  11.1*  11.3*   Glucose Recent Labs     12/17/12  1232  12/17/12  1607  12/17/12  2006  12/17/12  2351  12/18/12  0354  12/18/12  0745  GLUCAP  234*  234*  199*  174*  132*  126*   CXR: 8/6 Worsening of patchy airspace disease. Consider pneumonia or  edema.   ASSESSMENT / PLAN:    PULMONARY  A:  Acute respiratory failure 2nd to pulmonary infiltrates, resolving. Effusions.  ? Edema v. pna  on 8/6 cxr.  On and off vent. P:   - PRN BD's. - Control rate to avoid diastolic CHF. - If able to tolerate TC for >24 hrs at 28% then will consider SNF placement. - Place on TC today and attempt 24 hours. - Continue lasix.    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 A fib with RVR >> amiodarone d/c due to bradycardia, hypotension. Hx of CAD, HTN, hyperlipidemia. Shock >> resolved P:  - Anticoagulation managed by Cards. Coumadin. - Continue Cardizem PO for rate control @ 30 q6 hour, per cards - may need to change to 60mg  q 8hr for consistency.  RENAL Lab Results  Component Value Date   NA 138 12/18/2012   NA 136 12/17/2012   NA 138 12/14/2012    A: Acute kidney injury >> renal fx stable. Hypernatremia  Resolved with increase water rx Hypokalemia - resolved Hypercalcemia improving Hyperphosphatemia resolved Mag WNL P:   - Continue free water. - Negative fluid balance. - Strict I/O.   GASTROINTESTINAL A:   Protein malnutrition Mass head of pancreas-- Will need further assessment of pancreatic lesion when more stable, likely in LTAC or outpatient, Ca 19-9 elevated (73.1)- concern cancer P:   - PEG tube. - Pepcid for SUP - Gen sx consulted, will order MRCP when more confident for safety on trach collar trip in MRI machine as patient continues to go on and off the vent and realistically little surgical interventions would be helpful at this point.   GYN A:  Uterine prolapse. P: - GYN consulted 7/18 >  Dx is uterine prolapse  HEMATOLOGIC  Recent Labs Lab 12/14/12 1200 12/17/12 0500 12/18/12 0500  HGB 7.0* 6.5* 8.3*    A:  Anemia of chronic disease and critical illness >> PRBC transfusion 7/17, 7/18. Thrombocytopenia - resolved  ? Dilutional anemia  Mild Leukocytosis - WBC 14.9 today, no fever. Likely hemoconcentration. P:  - Monitor CBC  - Continue coumadin for AFib 8/10> Hg=6.5 will tx 1u  INFECTIOUS A: Initial concern for  aspiration pneumonia >> has persistent pulmonary infiltrates, and increasing WBC. P:   - No abx, follow fever curve  ENDOCRINE A:  DM2, uncontrolled since 7/31 P:   - SSI - Lantus to be increased if needing more SSI   NEUROLOGIC A: Acute encephalopathy 2nd to hypoxia/hypercapnia. Muscular deconditioning. P:   -  PT/OT, progress, especially as neuro better    Today's summary:  - awaiting placement  - Consider call GI for planned endo [place] as outpt only if she is markedly improved as may not be a candidate for any bx's much less rx if this is pancreatic ca at this point.  Patient is tolerating TC. Can go to SNF if TC is tolerated for 24 hours.   Alyson Reedy, M.D. [facility] Pulmonary/Critical Care Medicine. Pager: [phone]. After hours pager: [phone].

## 2012-12-18 NOTE — Progress Notes (Signed)
SUBJECTIVE:  Trached.  No acute complaints.     PHYSICAL EXAM Filed Vitals:   12/18/12 0353 12/18/12 0430 12/18/12 0442 12/18/12 0817  BP: 103/74  122/65 120/67  Pulse: 64 80 67 77  Temp: 97.9 F (36.6 C)   97.8 F (36.6 C)  TempSrc: Axillary   Axillary  Resp: 13 20 31    Height:      Weight:      SpO2: 98% 99% 100% 97%   General:  No distress Lungs:  Decreased breath sounds Heart:  Irregular Abdomen:  Positive bowel sounds, no rebound no guarding Extremities:  No edema   LABS:  Results for orders placed during the hospital encounter of 11/09/12 (from the past 24 hour(s))  TYPE AND SCREEN     Status: None   Collection Time    12/17/12 10:20 AM      Result Value Range   ABO/RH(D) A POS     Antibody Screen NEG     Sample Expiration 12/20/2012     Unit Number [id number]     Blood Component Type RED CELLS,LR     Unit division 00     Status of Unit ISSUED     Transfusion Status OK TO TRANSFUSE     Crossmatch Result Compatible    PREPARE RBC (CROSSMATCH)     Status: None   Collection Time    12/17/12 10:20 AM      Result Value Range   Order Confirmation ORDER PROCESSED BY BLOOD BANK    GLUCOSE, CAPILLARY     Status: Abnormal   Collection Time    12/17/12 12:32 PM      Result Value Range   Glucose-Capillary 234 (*) 70 - 99 mg/dL  GLUCOSE, CAPILLARY     Status: Abnormal   Collection Time    12/17/12  4:07 PM      Result Value Range   Glucose-Capillary 234 (*) 70 - 99 mg/dL  GLUCOSE, CAPILLARY     Status: Abnormal   Collection Time    12/17/12  8:06 PM      Result Value Range   Glucose-Capillary 199 (*) 70 - 99 mg/dL  GLUCOSE, CAPILLARY     Status: Abnormal   Collection Time    12/17/12 11:51 PM      Result Value Range   Glucose-Capillary 174 (*) 70 - 99 mg/dL  GLUCOSE, CAPILLARY     Status: Abnormal   Collection Time    12/18/12  3:54 AM      Result Value Range   Glucose-Capillary 132 (*) 70 - 99 mg/dL  PROTIME-INR     Status: Abnormal   Collection  Time    12/18/12  5:00 AM      Result Value Range   Prothrombin Time 21.8 (*) 11.6 - 15.2 seconds   INR 1.97 (*) 0.00 - 1.49  CBC     Status: Abnormal   Collection Time    12/18/12  5:00 AM      Result Value Range   WBC 12.5 (*) 4.0 - 10.5 K/uL   RBC 2.97 (*) 3.87 - 5.11 MIL/uL   Hemoglobin 8.3 (*) 12.0 - 15.0 g/dL   HCT 82.9 (*) 56.2 - 13.0 %   MCV 85.2  78.0 - 100.0 fL   MCH 27.9  26.0 - 34.0 pg   MCHC 32.8  30.0 - 36.0 g/dL   RDW 86.5 (*) 78.4 - 69.6 %   Platelets 219  150 - 400 K/uL  COMPREHENSIVE  METABOLIC PANEL     Status: Abnormal   Collection Time    12/18/12  5:00 AM      Result Value Range   Sodium 138  135 - 145 mEq/L   Potassium 3.2 (*) 3.5 - 5.1 mEq/L   Chloride 100  96 - 112 mEq/L   CO2 25  19 - 32 mEq/L   Glucose, Bld 131 (*) 70 - 99 mg/dL   BUN 81 (*) 6 - 23 mg/dL   Creatinine, Ser 1.61 (*) 0.50 - 1.10 mg/dL   Calcium 09.6 (*) 8.4 - 10.5 mg/dL   Total Protein 6.2  6.0 - 8.3 g/dL   Albumin 2.5 (*) 3.5 - 5.2 g/dL   AST 32  0 - 37 U/L   ALT 36 (*) 0 - 35 U/L   Alkaline Phosphatase 155 (*) 39 - 117 U/L   Total Bilirubin 0.5  0.3 - 1.2 mg/dL   GFR calc non Af Amer 19 (*) >90 mL/min   GFR calc Af Amer 22 (*) >90 mL/min  GLUCOSE, CAPILLARY     Status: Abnormal   Collection Time    12/18/12  7:45 AM      Result Value Range   Glucose-Capillary 126 (*) 70 - 99 mg/dL    Intake/Output Summary (Last 24 hours) at 12/18/12 0834 Last data filed at 12/18/12 0700  Gross per 24 hour  Intake 1872.5 ml  Output    952 ml  Net  920.5 ml    ASSESSMENT AND PLAN:  Acute on chronic diastolic congestive heart failure:   Back on home dose of Lasix PO.  Continue.    CHRONIC KIDNEY DISEASE STAGE V:  Creat is stable.  Continue current therapies.   FIBRILLATION, ATRIAL:  Continue rate control and anticoagulation for now.  Possible DCCV in the future.    Acute respiratory failure with hypoxia:  Status post trach.  Plan for LTAC vs SNF.   We will follow as needed.    Fayrene Fearing [facility] 12/18/2012 8:34 AM

## 2012-12-18 NOTE — Progress Notes (Addendum)
ANTICOAGULATION CONSULT NOTE - Follow Up Consult  Pharmacy Consult for Warfarin Indication: atrial fibrillation  No Known Allergies  Labs:  Recent Labs  12/16/12 0430 12/17/12 0500 12/18/12 0500  HGB  --  6.5* 8.3*  HCT  --  20.5* 25.3*  PLT  --  214 219  LABPROT 22.4*  --  21.8*  INR 2.04*  --  1.97*  CREATININE  --  2.55* 2.40*    Estimated Creatinine Clearance: 19.9 ml/min (by C-G formula based on Cr of 2.4).   Assessment: 72 y/o female patient who continues on warfarin for anticoagulation with hx Afib. INR this morning is subtherapeutic just below goal at 1.97. Will continue home regimen with higher dose of 5mg  tonight, patient's INR relatively stable will continue with mwf INR checks  Goal of Therapy:  INR 2-3   Plan:  1. Coumadin 5 mg MWF, 2.5 mg TTSS 2. PT/INR daily  Sheppard Coil PharmD., BCPS Clinical Pharmacist Pager [phone] 12/18/2012 8:48 AM

## 2012-12-19 DIAGNOSIS — J9600 Acute respiratory failure, unspecified whether with hypoxia or hypercapnia: Secondary | ICD-10-CM

## 2012-12-19 LAB — CBC
HCT: 22.8 % — ABNORMAL LOW (ref 36.0–46.0)
Hemoglobin: 7.6 g/dL — ABNORMAL LOW (ref 12.0–15.0)
MCHC: 33.3 g/dL (ref 30.0–36.0)
RDW: 18.7 % — ABNORMAL HIGH (ref 11.5–15.5)
WBC: 15 10*3/uL — ABNORMAL HIGH (ref 4.0–10.5)

## 2012-12-19 LAB — GLUCOSE, CAPILLARY
Glucose-Capillary: 176 mg/dL — ABNORMAL HIGH (ref 70–99)
Glucose-Capillary: 208 mg/dL — ABNORMAL HIGH (ref 70–99)
Glucose-Capillary: 157 mg/dL — ABNORMAL HIGH (ref 70–99)

## 2012-12-19 LAB — BASIC METABOLIC PANEL
BUN: 100 mg/dL — ABNORMAL HIGH (ref 6–23)
Chloride: 98 mEq/L (ref 96–112)
GFR calc Af Amer: 16 mL/min — ABNORMAL LOW (ref >90)
GFR calc non Af Amer: 14 mL/min — ABNORMAL LOW (ref >90)
Potassium: 4 mEq/L (ref 3.5–5.1)
Sodium: 135 mEq/L (ref 135–145)

## 2012-12-19 LAB — PROTIME-INR
INR: 2.34 — ABNORMAL HIGH (ref 0.00–1.49)
Prothrombin Time: 24.9 seconds — ABNORMAL HIGH (ref 11.6–15.2)

## 2012-12-19 LAB — PHOSPHORUS: Phosphorus: 5.1 mg/dL — ABNORMAL HIGH (ref 2.3–4.6)

## 2012-12-19 MED ORDER — METOPROLOL TARTRATE 25 MG/10 ML ORAL SUSPENSION
37.5000 mg | Freq: Four times a day (QID) | ORAL | Status: DC
  Administered 2012-12-19 – 2012-12-22 (×6): 37.5 mg
  Filled 2012-12-19 (×16): qty 15

## 2012-12-19 NOTE — Clinical Social Work Note (Addendum)
3:55pm- CSW left message with Steward Drone at [facility] re: insurance authorization.  CSW also updated [facility].    1:21pm- CSW received notification that [facility] needed: PT/OT evals for the pt.  Pt has had no PT/OT eval.  This information was passed to [facility].  CSW awaiting insurance authorization.  11:56am CSW confirmed bed at [facility].  CSW received notification from [facility] in [place] that a bed was available today.  CSW faxed updated clinicals to [place] at [facility] for review.  CSW left message for Steward Drone re: authorization for [facility] and possible d/c today.  CSW awaiting a return call from [employer]) and authorization for d/c.  Vickii Penna, LCSWA [phone]  Clinical Social Work

## 2012-12-19 NOTE — Progress Notes (Signed)
PULMONARY  / CRITICAL CARE MEDICINE  Name: [name] MRN: [id number] DOB: [birth year]    ADMISSION DATE:  11/09/2012 CONSULTATION DATE:  11/09/2012  REFERRING MD :  Preston Fleeting PRIMARY SERVICE: PCCM  CHIEF COMPLAINT:  Shortness of breath.  BRIEF PATIENT DESCRIPTION: 72 y/o female with CHF was admitted on 7/3 from the [facility] ED with acute hypoxemic respiratory failure due to a CHF exacerbation, ?Superimposed PNA.  LINES / TUBES: 7/3 ETT >>11/13/12, 11/13/12  (failed extubation immediately, ? aspirated) >> 7/17 7/4 CVC R IJ >> 7/17 7/5 A-line >> 7/18 7/17 Trach (DF) >> downsized to #4 8/1 > replaced with # 6 cuffed 8/2 for resp distrss 7/17 Rt PICC >>  7/22 PEG >>>  CULTURES: 7/4 U strep >> neg 7/4 legionella >> neg 7/3 mrsa PCR >> POSITIVE 7/8 mini-BAL >> yeast 7/16 sputum >> yeast  7/16 blood >> neg 7/16 urine >> yeast 50 K colonies 7/26 C. Diff>>>neg  ANTIBIOTICS: 7/3 Ceftriaxone >>7/6 7/4 levofloxacin >>7/7 7/4 Vancomycin >>7/6, restarted 7/8 >> 7/18 7/7 Unasyn >> 7/16 7/16 cefepime >> 7/24  SIGNIFICANT EVENTS: 7/3 admission 7/07 reintubated immediately after extubation.  ? Aspiration. ABX restarted.  on Neo-Synephrine.  7/09 Able to wean fio2 and neo. Lopressor required for rate control of afib. 7/10 Off neo, but now tachycardic.  Failed SBT this AM, but RT felt it was due to sedation.  Sedation weaned 7/14 Hypotensive, vomiting, amiodarone turned off for low heart rate 7/16 Change to pressure control 7/17 Start pressors, PRBC transfusion, trach 7/18 Off pressors, PRBC transfusion 7/28- 10-12 hrs trach collar trial 7/29- recs from surgery for pancreatic lesions obtained 8/2 back on vent   STUDIES: 7/16 CT chest >> emphysema, b/l ASD, small effusions 7/16 CT abd/pelvis >> 2.6 x 2 cm nodule in head of pancreas, bone changes suggestive of Paget's disease  SUBJECTIVE: Tolerating TC    VITAL SIGNS: Temp:  [98 F (36.7 C)-98.2 F (36.8 C)] 98.1 F (36.7 C) (08/12  1219) Pulse Rate:  [56-78] 70 (08/12 1219) Resp:  [20-33] 26 (08/12 1219) BP: (87-106)/(49-74) 106/60 mmHg (08/12 1219) SpO2:  [98 %-100 %] 100 % (08/12 1219) FiO2 (%):  [40 %] 40 % (08/12 1219) Weight:  [73.4 kg (161 lb 13.1 oz)] 73.4 kg (161 lb 13.1 oz) (08/12 0409)  HEMODYNAMICS: CVP:  [3 mmHg-20 mmHg] 7 mmHg  VENTILATOR SETTINGS: Vent Mode:  [-] PRVC FiO2 (%):  [40 %] 40 % Set Rate:  [16 bmp] 16 bmp Vt Set:  [500 mL] 500 mL PEEP:  [5 cmH20] 5 cmH20 Plateau Pressure:  [18 cmH20-23 cmH20] 22 cmH20  INTAKE / OUTPUT: Intake/Output     08/11 0701 - 08/12 0700 08/12 0701 - 08/13 0700   I.V. (mL/kg) 260 (3.5)    Blood     NG/GT 795    Total Intake(mL/kg) 1055 (14.4)    Urine (mL/kg/hr) 450 (0.3) 550 (1)   Stool     Total Output 450 550   Net +605 -550        Stool Occurrence  1 x    PHYSICAL EXAMINATION: Gen: Lying  in bed on trach collar mod increase wob HEENT: PEG in place, trach site clean PULM: cta CV: irregular, no m/r/g AB: soft, non tender, BS+ Ext: no edema Neuro: RASS 0, follows commands, more interactive  LABS: CBC Recent Labs     12/17/12  0500  12/18/12  0500  12/19/12  0531  WBC  10.6*  12.5*  15.0*  HGB  6.5*  8.3*  7.6*  HCT  20.5*  25.3*  22.8*  PLT  214  219  183   Coag's Recent Labs     12/18/12  0500  12/19/12  0531  INR  1.97*  2.34*   BMET Recent Labs     12/17/12  0500  12/18/12  0500  12/19/12  0531  NA  136  138  135  K  3.4*  3.2*  4.0  CL  99  100  98  CO2  25  25  23   BUN  83*  81*  100*  CREATININE  2.55*  2.40*  3.13*  GLUCOSE  183*  131*  211*   Electrolytes Recent Labs     12/17/12  0500  12/18/12  0500  12/19/12  0531  CALCIUM  11.1*  11.3*  10.8*  MG   --    --   2.1  PHOS   --    --   5.1*   Glucose Recent Labs     12/18/12  1526  12/18/12  2004  12/19/12  0008  12/19/12  0411  12/19/12  0836  12/19/12  1219  GLUCAP  282*  215*  88  144*  176*  231*   CXR: 8/6 Worsening of patchy airspace  disease. Consider pneumonia or  edema.   ASSESSMENT / PLAN:    PULMONARY  A:  Acute respiratory failure 2nd to pulmonary infiltrates, resolving. Effusions.  ? Edema v. pna on 8/6 cxr.  On and off vent. P:   - PRN BD's. - Control rate to avoid diastolic CHF. - If able to tolerate TC for >24 hrs at 28% then will consider SNF placement. - Place on TC today and attempt 24 hours. - Continue lasix as ordered.    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 A fib with RVR >> amiodarone d/c due to bradycardia, hypotension. Hx of CAD, HTN, hyperlipidemia. Shock >> resolved P:  - Anticoagulation managed by Cards. Coumadin. - Continue Cardizem PO for rate control @ 30 q6 hour.  RENAL Lab Results  Component Value Date   NA 135 12/19/2012   NA 138 12/18/2012   NA 136 12/17/2012    A: Acute kidney injury >> renal fx stable. Hypernatremia  Resolved with increase water rx Hypokalemia - resolved Hypercalcemia improving Hyperphosphatemia resolved Mag WNL P:   - Continue free water. - Negative fluid balance. - Strict I/O.   GASTROINTESTINAL A:   Protein malnutrition Mass head of pancreas-- Will need further assessment of pancreatic lesion when more stable, likely in LTAC or outpatient, Ca 19-9 elevated (73.1)- concern cancer P:   - PEG tube. - Pepcid for SUP - Gen sx consulted, will order MRCP when more confident for safety on trach collar trip in MRI machine as patient continues to go on and off the vent and realistically little surgical interventions would be helpful at this point.   GYN A:  Uterine prolapse. P: - GYN consulted 7/18 >  Dx is uterine prolapse  HEMATOLOGIC  Recent Labs Lab 12/17/12 0500 12/18/12 0500 12/19/12 0531  HGB 6.5* 8.3* 7.6*    A:  Anemia of chronic disease and critical illness >> PRBC transfusion 7/17, 7/18. Thrombocytopenia - resolved  ? Dilutional anemia  Mild Leukocytosis - WBC 14.9 today, no fever. Likely  hemoconcentration. P:  - Monitor CBC  - Continue coumadin for AFib 8/10> Hg=6.5 will tx 1u  INFECTIOUS A: Initial concern for aspiration  pneumonia >> has persistent pulmonary infiltrates, and increasing WBC. P:   - No abx, follow fever curve  ENDOCRINE A:  DM2, uncontrolled since 7/31 P:   - SSI - Lantus to be increased if needing more SSI   NEUROLOGIC A: Acute encephalopathy 2nd to hypoxia/hypercapnia. Muscular deconditioning. P:   - PT/OT, progress, especially as neuro better    Today's summary:  - awaiting placement  - Consider call GI for planned endo [place] as outpt only if she is markedly improved as may not be a candidate for any bx's much less rx if this is pancreatic ca at this point.  Patient is tolerating TC. Can go to SNF if TC is tolerated for 24 hours.   Alyson Reedy, M.D. [facility] Pulmonary/Critical Care Medicine. Pager: [phone]. After hours pager: [phone].

## 2012-12-19 NOTE — Discharge Summary (Signed)
Physician Discharge Summary  Patient ID: [name] MRN: [id number] DOB/AGE: [birth year] 72 y.o.  Admit date: 11/09/2012 Discharge date: 12/22/2012    Discharge Diagnoses:  Acute Respiratory Failure  Pulmonary Infiltrates Pulmonary Edema Shock HOCM / Acute on Chronic Diastolic CHF Atrial Fibrillation with RVR CAD HTN HLD Acute Kidney Injury Hypernatremia Hypokalemia Hyperphosphatemia Protein Calorie Malnutrition Pancreatic Mass  Uterine Prolapse Anemia of Chronic Disease & Critical Illness Thrombocytopenia Diabetes Mellitus Acute Encephalopathy Muscular Deconditioning                                                                      DISCHARGE PLAN BY DIAGNOSIS     Acute Respiratory Failure  Pulmonary Infiltrates Pulmonary Edema  Discharge Plan:  - PRN BD's.  - Control rate to avoid diastolic CHF  - Trach care Q12 hours with inner cannula change QD - Attempt to wean to ATC continuously - Continue lasix for negative balance as renal function / bp permit - Nocturnal vent support as needed for increased work of breathing / fatigue - PRN CXR - would not decannulate at this time due to deconditioning   HOCM - with acute on chronic diastolic heart failure.  A fib with RVR - amiodarone d/c due to bradycardia, hypotension.  Rate controlled.  Hx of CAD, HTN, hyperlipidemia.  Shock - resolved   Discharge Plan: - Continue Coumadin dosing  - Continue Cardizem PO for rate control @ 30 q6 hour, per cards - may need to change to 60mg  q 8hr for consistency.     Acute kidney injury - renal fx stable.  Hypernatremia - Resolved with increase water rx  Hypokalemia - resolved  Hypercalcemia improving  Hyperphosphatemia resolved   Discharge Plan: - follow up BMP - Negative fluid balance as tolerated   Protein malnutrition  Mass head of pancreas-- Will need further assessment of pancreatic lesion when more stable, as outpatient, Ca 19-9 elevated (73.1) - concern  for cancer  Dysphagia  Discharge Plan: - PEG tube.  - Pepcid for SUP  - Gen sx consulted, will need MRCP when stronger.  Realistically little surgical interventions would be helpful at this point given overall health and tolerance of any major abdominal surgery - Consider call GI for planned endo [place] as outpt only if she is markedly improved as may not be a candidate for any bx's much less rx if this is pancreatic ca at this point. -continue TF at 45 ml /hr    Uterine prolapse.   Discharge Plan: - GYN consulted 7/18 > Dx is uterine prolapse    Anemia of chronic disease and critical illness - PRBC transfusion 7/17, 7/18.  8/10> Hg=6.5 tx 1u  Thrombocytopenia - resolved  Mild Leukocytosis - Likely hemoconcentration, resolving.    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   Discharge Plan: - SSI  - Lantus to  be increased if needing more SSI  - wean oral prednisone by 10 mg Q week to off  Acute encephalopathy 2nd to hypoxia/hypercapnia.  Muscular deconditioning.   Discharge Plan: - PT/OT, progress, especially as neuro better                    DISCHARGE SUMMARY   [name] is a 72 y.o. y/o female with a PMH of CAD s/p NSTEMI, AFib, HOCM, HTN, CKD, DM, HLD, LBBB, C-diff colitis infection in 2013, and multiple admits for acute respiratory failure requiring mechanical ventilation.  Patient admitted on 7/3 with acute hypoxemic respiratory failure in the setting of CHF exacerbation with concern for potential superimposed PNA.  She was empirically treated for PNA.  Initial CT chest with emphysema, bilateral  airspace disease and small effusions.  Patient required intubation in ER and was extubated on 7/7 with immediate failure / reintubated.  She ultimately required tracheostomy on 7/17 for chronic respiratory failure.  Initially required vasopressor support during ICU stay - weaned off 7/18.  Pan cultures with BAL positive for yeast, urine positive for yeast.  Patient was treated with IV antibiotics as below.  She had prolonged weaning course with intermittent usage of mechanical ventilation.  Weaned to ATC 8-10 hours daily with nocturnal support. Post vasopressor support, she was aggressively diuresed to negative balance.  Hospital course complicated by Afib with RVR, acute on chronic CHF.  Patient was treated with coumadin for afib.  Currently rate controlled with cardizem.  Acute kidney injury noted during admit and currently stable at 2.45.  7/16 CT abd/pelvis noted 2.6 x 2 cm nodule in head of pancreas, bone changes suggestive of Paget's disease.    At this time, it is felt she would not be able to tolerate any surgical biopsy / procedure for diagnosis.  She had anemia of critical illness during hospital course and was transfused on 7/17, 7/18 and 8/10.      Currently, she will continue TF for nutritional support.  Trach collar as tolerated, wean ATC oxygen to support sats > 90%              LINES / TUBES:  7/3 ETT >>11/13/12, 11/13/12 (failed extubation immediately, ? aspirated) >> 7/17  7/4 CVC R IJ >> 7/17  7/5 A-line >> 7/18  7/17 Trach (DF) >> downsized to #4 8/1 > replaced with # 6 cuffed 8/2 for resp distrss  7/17 Rt PICC >>  7/22 PEG >>>   CULTURES:  7/4 U strep >> neg  7/4 legionella >> neg  7/3 mrsa PCR >> POSITIVE  7/8 mini-BAL >> yeast  7/16 sputum >> yeast  7/16 blood >> neg  7/16 urine >> yeast 50 K colonies  7/26 C. Diff>>>neg   ANTIBIOTICS:  7/3 Ceftriaxone >>7/6  7/4 levofloxacin >>7/7  7/4 Vancomycin >>7/6, restarted 7/8 >> 7/18  7/7 Unasyn >> 7/16  7/16 cefepime >> 7/24    SIGNIFICANT EVENTS:  7/3 admission  7/07 reintubated immediately after extubation. ? Aspiration. ABX restarted. on Neo-Synephrine.  7/09 Able to wean fio2 and neo. Lopressor required for rate control of afib.  7/10 Off neo, but now tachycardic. Failed SBT this AM, but RT felt it was due to sedation. Sedation weaned  7/14 Hypotensive, vomiting, amiodarone turned off for low heart rate  7/16 Change to pressure control  7/17 Start pressors, PRBC transfusion, trach  7/18 Off pressors, PRBC transfusion  7/28 - 10 to12 hrs trach collar trial  7/29 -  recs from surgery for pancreatic lesions obtained  8/2 back on vent   STUDIES:  7/16 CT chest >> emphysema, b/l ASD, small effusions  7/16 CT abd/pelvis >> 2.6 x 2 cm nodule in head of pancreas, bone changes suggestive of Paget's disease    Discharge Exam: Gen: Lying in bed on trach collar, NAD HEENT: PEG in place, trach site clean  PULM: resps even non labored on ATC, cta CV: irregular, no m/r/g  AB: soft, non tender, BS+  Ext: no edema  Neuro: RASS 0, follows commands, more interactive but generalized weakness   Filed Vitals:   12/22/12 1201 12/22/12 1231 12/22/12 1259 12/22/12 1437  BP: 88/62 88/62 107/62 137/108  Pulse: 75 75 85   Temp:   97.8 F (36.6 C)   TempSrc:   Axillary   Resp:  30 36   Height:      Weight:      SpO2:   94%      Discharge Labs  BMET  Recent Labs Lab 12/17/12 0500 12/18/12 0500 12/19/12 0531 12/21/12 1000  NA 136 138 135 134*  K 3.4* 3.2* 4.0 4.0  CL 99 100 98 100  CO2 25 25 23 22   GLUCOSE 183* 131* 211* 350*  BUN 83* 81* 100* 80*  CREATININE 2.55* 2.40* 3.13* 2.45*  CALCIUM 11.1* 11.3* 10.8* 10.9*  MG  --   --  2.1  --   PHOS  --   --  5.1*  --     CBC  Recent Labs Lab 12/18/12 0500 12/19/12 0531 12/21/12 1000  HGB 8.3* 7.6* 7.1*  HCT 25.3* 22.8* 21.9*  WBC 12.5* 15.0* 13.6*  PLT 219 183 203    Anti-Coagulation  Recent Labs Lab 12/18/12 0500 12/19/12 0531  12/20/12 0500 12/21/12 0500 12/22/12 0500  INR 1.97* 2.34* 2.38* 2.83* 3.33*      Medication List    STOP taking these medications       acetaminophen 500 MG tablet  Commonly known as:  TYLENOL  Replaced by:  acetaminophen 160 MG/5ML solution     amiodarone 200 MG tablet  Commonly known as:  PACERONE     amLODipine 5 MG tablet  Commonly known as:  NORVASC     metoprolol tartrate 25 MG tablet  Commonly known as:  LOPRESSOR  Replaced by:  metoprolol tartrate 25 mg/10 mL Susp     nitroGLYCERIN 0.4 MG SL tablet  Commonly known as:  NITROSTAT     pantoprazole 40 MG tablet  Commonly known as:  PROTONIX     rosuvastatin 10 MG tablet  Commonly known as:  CRESTOR      TAKE these medications       acetaminophen 160 MG/5ML solution  Commonly known as:  TYLENOL  Place 20.3 mL (650 mg total) into feeding tube every 6 (six) hours as needed for fever.     antiseptic oral rinse Liqd  15 mL by Mouth Rinse route QID.     chlorhexidine 0.12 % solution  Commonly known as:  PERIDEX  Use as directed 15 mL in the mouth or throat 2 (two) times daily.     famotidine 40 MG/5ML suspension  Commonly known as:  PEPCID  Place 2.5 mL (20 mg total) into feeding tube daily.     feeding supplement (OSMOLITE 1.2 CAL) Liqd  Place 1,000 mL into feeding tube continuous. Per tube at 45 ml /hr     feeding supplement Liqd  Place 30 mL into feeding tube  3 (three) times daily.     furosemide 40 MG tablet  Commonly known as:  LASIX  Take 1 tablet (40 mg total) by mouth daily.     insulin aspart 100 UNIT/ML injection  Commonly known as:  novoLOG  Inject 0-20 Units into the skin every 4 (four) hours.     insulin glargine 100 UNIT/ML injection  Commonly known as:  LANTUS  Inject 0.05 mL (5 Units total) into the skin daily.     LORazepam 1 MG tablet  Commonly known as:  ATIVAN  Take 1 tablet (1 mg total) by mouth every 6 (six) hours as needed for anxiety.     metoprolol tartrate 25 mg/10  mL Susp  Commonly known as:  LOPRESSOR  Place 15 mL (37.5 mg total) into feeding tube every 6 (six) hours.     potassium chloride 20 MEQ/15ML (10%) solution  Take 15 mL (20 mEq total) by mouth daily.     predniSONE 10 MG tablet  Commonly known as:  STERAPRED UNI-PAK  Take 3 tablets (30 mg total) by mouth daily. 3 tabs daily for one week, then 2 tabs daily for one week, then decrease to 1 tab daily for on week then discontinue.     warfarin 5 MG tablet  Commonly known as:  COUMADIN  HOLD coumadin 8/15 and repeat INR in am 8/16.  GOAL INR 2-3          Disposition: SNF - ventilator SNF.   Discharged Condition: [name] has met maximum benefit of inpatient care and is medically stable and cleared for discharge.  Patient is pending follow up as above.      Time spent on disposition:  Greater than 35 minutes.   Signed: Canary Brim, NP-C [facility] Pgr: [phone] Office: [phone]

## 2012-12-19 NOTE — Progress Notes (Signed)
ANTICOAGULATION CONSULT NOTE - Follow Up Consult  Pharmacy Consult for Warfarin Indication: atrial fibrillation  No Known Allergies  Labs:  Recent Labs  12/17/12 0500 12/18/12 0500 12/19/12 0531  HGB 6.5* 8.3* 7.6*  HCT 20.5* 25.3* 22.8*  PLT 214 219 183  LABPROT  --  21.8* 24.9*  INR  --  1.97* 2.34*  CREATININE 2.55* 2.40* 3.13*    Estimated Creatinine Clearance: 15.2 ml/min (by C-G formula based on Cr of 3.13).   Assessment: 72 y/o female patient who continues on warfarin for anticoagulation with hx Afib. INR this morning back at goal(2.3). Will continue home regimen with lower dose of warfarin tonight. Will need to reassess continuing home regimen in am if upward trend continues. No bleeding issues noted however h/h are down slightly this morning.  Goal of Therapy:  INR 2-3   Plan:  1. Coumadin 5 mg MWF, 2.5 mg TTSS 2. PT/INR daily  Sheppard Coil PharmD., BCPS Clinical Pharmacist Pager [phone] 12/19/2012 10:46 AM

## 2012-12-19 NOTE — Progress Notes (Signed)
[facility] - Treatment Patient Details  Name: [name] MRN: [id number] Date of Birth: February 13, 1941  Today's Date: 12/19/2012 Time: 7253-6644 SLP Time Calculation (min): 43 min  Past Medical History:  Past Medical History  Diagnosis Date  . CAD (coronary artery disease)     a. Nonobst by cath 11/11: LAD 40%, mid-dist 25-30%; prox CFX 30%, mid 40%; OM 50%; PDA 50%; PLV 50%;  EF 65%.  . NSTEMI (non-ST elevated myocardial infarction)     a. In setting of AFib with RVR 03/2010, type 2. b. Again in 11/2011 felt 2/2 afib.  . Atrial fibrillation     a. DCCV 11/17/11. b. On coumadin & amiodarone.  Marland Kitchen HOCM (hypertrophic obstructive cardiomyopathy)     a. Echo 11/2011:severe LVH, EF 65-70%, mid-cavity gradient up to . b. Echo 06/2012: EF 65-70%, severe LVH, grade 2 d/dysf.  Marland Kitchen Hypertension   . Tobacco abuse   . CKD (chronic kidney disease), stage IV   . Diastolic CHF     preserved LVF  . Iron deficiency anemia   . Third degree uterine prolapse   . Hx MRSA infection   . Hx of cardiovascular stress test     a. Lex MV 12/13:  EF 56%, no ischemia    . Diabetes   . Hyperlipidemia   . Respiratory failure     Multiple admissions for resp failure requiring intubation.  . Hypercalcemia   . LBBB (left bundle branch block)     History of transient LBBB  . C. difficile colitis 01/2012   Past Surgical History:  Past Surgical History  Procedure Laterality Date  . Tubal ligation    . Cardioversion  11/17/2011    Procedure: CARDIOVERSION;  Surgeon: Hillis Range, MD;  Location: [facility] OR;  Service: Cardiovascular;  Laterality: N/A;    Assessment / Plan / Recommendation Clinical Impression  When PMSV placed and pt. realized she could verbalize, she stated angrily, "I want to go home, and I want to go right now!!"  Pt. also stated, "I haven't eaten anything since yesterday, and I'm starving!"  Pt. asked where she was and why we wouldn't let her go home.  Pt. was re-oriented to place and  situation, but continued to re-iterate how badly she wants to go home.  Pt. tolerated the PMSV well for 40 minutes, then near the end of the session her Sats decreased to 92% (question if accurate, as the pulse ox. did not appear to fit her finger well).  Recommend replacing probe and allowing pt.to wear PMSV when nursing, MD, or therapists are in the room, to allow pt. to verbalize feeling, ask questions, and communicate her basic needs.  Goals updated.    Plan  All goals met;Goals updated    Follow Up Recommendations  Skilled Nursing facility    Pertinent Vitals/Pain n/a    SLP Goals Potential to Achieve Goals: Good SLP Goal #1 - Progress: Met SLP Goal #2 - Progress: Met SLP Goal #3 - Progress: Met SLP Goal #4 - Progress: Partly met SLP Goal #5: Pt. will complete swallow study and speech/language/cognitive evaluation with PMSV in place. SLP Goal #5 - Progress: Partly met   PMSV Trial  PMSV was placed for: 40 minutes Able to redirect subglottic air through upper airway: Yes Able to Attain Phonation: Yes Voice Quality: Normal Able to Expectorate Secretions: Yes Level of Secretion Expectoration with PMSV: Oral Breath Support for Phonation: Adequate Intelligibility: Intelligible Word: 75-100% accurate Phrase: 75-100% accurate Sentence: 75-100% accurate  Respirations During Trial:  (Monitor not tracking) SpO2 During Trial: 99 % Pulse During Trial: 65 Behavior: Angry   Tracheostomy Tube       Vent Dependency  FiO2 (%): 40 %    Cuff Deflation Trial  GO     Tolerated Cuff Deflation: Yes Length of Time for Cuff Deflation Trial:  (Only slightly inflated at baseline) Behavior: Alert;Angry;Confused;Expresses self well;Loud   [name] T 12/19/2012, 3:02 PM

## 2012-12-20 LAB — GLUCOSE, CAPILLARY
Glucose-Capillary: 183 mg/dL — ABNORMAL HIGH (ref 70–99)
Glucose-Capillary: 188 mg/dL — ABNORMAL HIGH (ref 70–99)
Glucose-Capillary: 314 mg/dL — ABNORMAL HIGH (ref 70–99)

## 2012-12-20 LAB — PROTIME-INR
INR: 2.38 — ABNORMAL HIGH (ref 0.00–1.49)
Prothrombin Time: 25.2 seconds — ABNORMAL HIGH (ref 11.6–15.2)

## 2012-12-20 MED ORDER — FREE WATER
200.0000 mL | Freq: Four times a day (QID) | Status: DC
  Administered 2012-12-20 – 2012-12-21 (×5): 200 mL

## 2012-12-20 NOTE — Progress Notes (Signed)
ANTICOAGULATION CONSULT NOTE - Follow Up Consult  Pharmacy Consult for Warfarin Indication: atrial fibrillation  No Known Allergies  Labs:  Recent Labs  12/18/12 0500 12/19/12 0531 12/20/12 0500  HGB 8.3* 7.6*  --   HCT 25.3* 22.8*  --   PLT 219 183  --   LABPROT 21.8* 24.9* 25.2*  INR 1.97* 2.34* 2.38*  CREATININE 2.40* 3.13*  --     Estimated Creatinine Clearance: 15.2 ml/min (by C-G formula based on Cr of 3.13).   Assessment: 72 y/o female patient who continues on warfarin for anticoagulation with hx Afib. INR this morning back at goal (2.38).  Noted Hg= 7.6 8/12 and range has been 6.5-8.3 from 8/7 to 8/12. Plans noted for SNF if TC tolerated.  Goal of Therapy:  INR 2-3   Plan:  1. Coumadin 5 mg MWF, 2.5 mg TTSS 2. PT/INR daily  Harland German, Pharm D 12/20/2012 8:16 AM

## 2012-12-20 NOTE — Clinical Social Work Note (Signed)
RNCM reports no [employer] authorization for Vent SNF.  LTACH is now being pursued.  CSW remains available to assist as necessary.  Vickii Penna, LCSWA [phone]  Clinical Social Work

## 2012-12-20 NOTE — Progress Notes (Signed)
PULMONARY  / CRITICAL CARE MEDICINE  Name: [name] MRN: [id number] DOB: [birth year]    ADMISSION DATE:  11/09/2012 CONSULTATION DATE:  11/09/2012  REFERRING MD :  Preston Fleeting PRIMARY SERVICE: PCCM  CHIEF COMPLAINT:  Shortness of breath.  BRIEF PATIENT DESCRIPTION: 72 y/o female with CHF was admitted on 7/3 from the [facility] ED with acute hypoxemic respiratory failure due to a CHF exacerbation, ?Superimposed PNA.  LINES / TUBES: 7/3 ETT >>11/13/12, 11/13/12  (failed extubation immediately, ? aspirated) >> 7/17 7/4 CVC R IJ >> 7/17 7/5 A-line >> 7/18 7/17 Trach (DF) >> downsized to #4 8/1 > replaced with # 6 cuffed 8/2 for resp distrss 7/17 Rt PICC >>  7/22 PEG >>>  CULTURES: 7/4 U strep >> neg 7/4 legionella >> neg 7/3 mrsa PCR >> POSITIVE 7/8 mini-BAL >> yeast 7/16 sputum >> yeast  7/16 blood >> neg 7/16 urine >> yeast 50 K colonies 7/26 C. Diff>>>neg  ANTIBIOTICS: 7/3 Ceftriaxone >>7/6 7/4 levofloxacin >>7/7 7/4 Vancomycin >>7/6, restarted 7/8 >> 7/18 7/7 Unasyn >> 7/16 7/16 cefepime >> 7/24  SIGNIFICANT EVENTS: 7/3 admission 7/07 reintubated immediately after extubation.  ? Aspiration. ABX restarted.  on Neo-Synephrine.  7/09 Able to wean fio2 and neo. Lopressor required for rate control of afib. 7/10 Off neo, but now tachycardic.  Failed SBT this AM, but RT felt it was due to sedation.  Sedation weaned 7/14 Hypotensive, vomiting, amiodarone turned off for low heart rate 7/16 Change to pressure control 7/17 Start pressors, PRBC transfusion, trach 7/18 Off pressors, PRBC transfusion 7/28- 10-12 hrs trach collar trial 7/29- recs from surgery for pancreatic lesions obtained 8/2 back on vent  8/12 tol ATC x8-10 hrs daily   STUDIES: 7/16 CT chest >> emphysema, b/l ASD, small effusions 7/16 CT abd/pelvis >> 2.6 x 2 cm nodule in head of pancreas, bone changes suggestive of Paget's disease  SUBJECTIVE:  Tolerating ATC x 8-10 hours daily.     VITAL SIGNS: Temp:  [97.6 F  (36.4 C)-98.9 F (37.2 C)] 98.9 F (37.2 C) (08/13 0729) Pulse Rate:  [60-84] 84 (08/13 0835) Resp:  [18-30] 25 (08/13 0835) BP: (94-137)/(53-68) 99/67 mmHg (08/13 0835) SpO2:  [95 %-100 %] 99 % (08/13 0835) FiO2 (%):  [28 %-40 %] 28 % (08/13 0835)  HEMODYNAMICS: CVP:  [3 mmHg-15 mmHg] 5 mmHg  VENTILATOR SETTINGS: Vent Mode:  [-] PRVC FiO2 (%):  [28 %-40 %] 28 % Set Rate:  [16 bmp] 16 bmp Vt Set:  [500 mL] 500 mL PEEP:  [5 cmH20] 5 cmH20 Plateau Pressure:  [16 cmH20-21 cmH20] 21 cmH20  INTAKE / OUTPUT: Intake/Output     08/12 0701 - 08/13 0700 08/13 0701 - 08/14 0700   I.V. (mL/kg) 460 (6.3) 40 (0.5)   NG/GT 2095 90   Total Intake(mL/kg) 2555 (34.8) 130 (1.8)   Urine (mL/kg/hr) 1775 (1) 175 (1.1)   Total Output 1775 175   Net +780 -45        Stool Occurrence 3 x     PHYSICAL EXAMINATION: Gen: Lying  in bed on trach collar, NAD  HEENT: PEG in place, trach site clean PULM: resps even non labored on ATC, cta CV: irregular, no m/r/g AB: soft, non tender, BS+ Ext: no edema Neuro: RASS 0, follows commands, MAE, gen weakness   LABS: CBC Recent Labs     12/18/12  0500  12/19/12  0531  WBC  12.5*  15.0*  HGB  8.3*  7.6*  HCT  25.3*  22.8*  PLT  219  183   Coag's Recent Labs     12/18/12  0500  12/19/12  0531  12/20/12  0500  INR  1.97*  2.34*  2.38*   BMET Recent Labs     12/18/12  0500  12/19/12  0531  NA  138  135  K  3.2*  4.0  CL  100  98  CO2  25  23  BUN  81*  100*  CREATININE  2.40*  3.13*  GLUCOSE  131*  211*   Electrolytes Recent Labs     12/18/12  0500  12/19/12  0531  CALCIUM  11.3*  10.8*  MG   --   2.1  PHOS   --   5.1*   Glucose Recent Labs     12/19/12  1219  12/19/12  1638  12/19/12  1951  12/20/12  0017  12/20/12  0513  12/20/12  0748  GLUCAP  231*  208*  157*  190*  183*  186*   CXR: No results found.   ASSESSMENT / PLAN:    PULMONARY  A:  Acute respiratory failure 2nd to pulmonary infiltrates,  resolving. Effusions.  On and off vent. Tol ATC for long periods during day, does not tol 24hrs ATC.  P:   - PRN BD's. - HR control  - Keep dry as tol  - Cont ATC as tolerated daytime. 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 A fib with RVR >> amiodarone d/c due to bradycardia, hypotension. Hx of CAD, HTN, hyperlipidemia. Shock >> resolved P:  - Anticoagulation managed by Cards. Coumadin. - Continue Cardizem PO for rate control   RENAL  Recent Labs Lab 12/14/12 0400 12/17/12 0500 12/18/12 0500 12/19/12 0531  CREATININE 2.92* 2.55* 2.40* 3.13*   A: Acute kidney injury >> renal fx stable. Hypernatremia  Resolved with increase water rx Hypokalemia - resolved Hypercalcemia improving Hyperphosphatemia resolved Mag WNL P:   - decrease free water. - Negative fluid balance as tol. - Strict I/O.   GASTROINTESTINAL A:   Protein malnutrition Mass head of pancreas-- Will need further assessment of pancreatic lesion when stronger, likely in LTAC or as outpatient, Ca 19-9 elevated (73.1)- concern cancer P:   - PEG tube. - Pepcid for SUP - Likely needs MRCP when stronger, tol ATC at all times - realistically little surgical interventions would be helpful at this point.   GYN A:  Uterine prolapse. P: - GYN consulted 7/18 >  Dx is uterine prolapse.  Not surgical candidate.  Outpt GYN f/u.   HEMATOLOGIC  Recent Labs Lab 12/17/12 0500 12/18/12 0500 12/19/12 0531  HGB 6.5* 8.3* 7.6*    A:  Anemia of chronic disease and critical illness >> PRBC transfusion 7/17, 7/18. Thrombocytopenia - resolved  ? Dilutional anemia  Mild Leukocytosis - WBC 14.9 today, no fever. Likely hemoconcentration. P:  - Monitor CBC  - Continue coumadin for AFib   INFECTIOUS A: Initial concern for aspiration pneumonia >> has persistent pulmonary infiltrates, and increasing WBC. P:   - No abx, follow fever curve  ENDOCRINE A:  DM2,  uncontrolled since 7/31 P:   - SSI, lantus    NEUROLOGIC A: Acute encephalopathy 2nd to hypoxia/hypercapnia. Muscular deconditioning. P:   - PT/OT, progress, especially as neuro better    Today's summary:  - awaiting placement  - Consider call GI for planned endo [place] as outpt only if she is  markedly improved as may not be a candidate for any bx's much less rx if this is pancreatic ca at this point.  Patient is tolerating some TC wean, not tol ATC 24hours.  Ready for d/c to vent SNF.   Danford Bad, NP 12/20/2012  9:07 AM Pager: [phone] or [phone]  *Care during the described time interval was provided by me and/or other providers on the critical care team. I have reviewed this patient's available data, including medical history, events of note, physical examination and test results as part of my evaluation.  Patient seen and examined, agree with above note.  I dictated the care and orders written for this patient under my direction.  Alyson Reedy, MD [phone]

## 2012-12-21 LAB — BASIC METABOLIC PANEL
CO2: 22 mEq/L (ref 19–32)
Calcium: 10.9 mg/dL — ABNORMAL HIGH (ref 8.4–10.5)
Chloride: 100 mEq/L (ref 96–112)
Glucose, Bld: 350 mg/dL — ABNORMAL HIGH (ref 70–99)
Sodium: 134 mEq/L — ABNORMAL LOW (ref 135–145)

## 2012-12-21 LAB — GLUCOSE, CAPILLARY
Glucose-Capillary: 219 mg/dL — ABNORMAL HIGH (ref 70–99)
Glucose-Capillary: 46 mg/dL — ABNORMAL LOW (ref 70–99)
Glucose-Capillary: 46 mg/dL — ABNORMAL LOW (ref 70–99)
Glucose-Capillary: 53 mg/dL — ABNORMAL LOW (ref 70–99)
Glucose-Capillary: 146 mg/dL — ABNORMAL HIGH (ref 70–99)

## 2012-12-21 LAB — CBC
HCT: 21.9 % — ABNORMAL LOW (ref 36.0–46.0)
MCH: 28 pg (ref 26.0–34.0)
MCV: 86.2 fL (ref 78.0–100.0)
Platelets: 203 10*3/uL (ref 150–400)
RBC: 2.54 MIL/uL — ABNORMAL LOW (ref 3.87–5.11)

## 2012-12-21 LAB — PROTIME-INR
INR: 2.83 — ABNORMAL HIGH (ref 0.00–1.49)
Prothrombin Time: 28.8 seconds — ABNORMAL HIGH (ref 11.6–15.2)

## 2012-12-21 NOTE — Progress Notes (Signed)
Pt taken off the vent and placed on 28% ATC and the pt is tolerating well at this time. Pt has a patent #[id number] with cuff inflated to MOV, secure and in place. Obturator, spare trach and ambu bag at bedside. No complications noted. RT will monitor.

## 2012-12-21 NOTE — Progress Notes (Signed)
PULMONARY  / CRITICAL CARE MEDICINE  Name: [name] MRN: [id number] DOB: 1941-04-04    ADMISSION DATE:  11/09/2012 CONSULTATION DATE:  11/09/2012  REFERRING MD :  Preston Fleeting PRIMARY SERVICE: PCCM  CHIEF COMPLAINT:  Shortness of breath.  BRIEF PATIENT DESCRIPTION: 72 y/o female with CHF was admitted on 7/3 from the [facility] ED with acute hypoxemic respiratory failure due to a CHF exacerbation, ?Superimposed PNA.  LINES / TUBES: 7/3 ETT >>11/13/12, 11/13/12  (failed extubation immediately, ? aspirated) >> 7/17 7/4 CVC R IJ >> 7/17 7/5 A-line >> 7/18 7/17 Trach (DF) >> downsized to #4 8/1 > replaced with # 6 cuffed 8/2 for resp distrss 7/17 Rt PICC >>  7/22 PEG >>>  CULTURES: 7/4 U strep >> neg 7/4 legionella >> neg 7/3 mrsa PCR >> POSITIVE 7/8 mini-BAL >> yeast 7/16 sputum >> yeast  7/16 blood >> neg 7/16 urine >> yeast 50 K colonies 7/26 C. Diff>>>neg  ANTIBIOTICS: 7/3 Ceftriaxone >>7/6 7/4 levofloxacin >>7/7 7/4 Vancomycin >>7/6, restarted 7/8 >> 7/18 7/7 Unasyn >> 7/16 7/16 cefepime >> 7/24  SIGNIFICANT EVENTS: 7/3 admission 7/07 reintubated immediately after extubation.  ? Aspiration. ABX restarted.  on Neo-Synephrine.  7/09 Able to wean fio2 and neo. Lopressor required for rate control of afib. 7/10 Off neo, but now tachycardic.  Failed SBT this AM, but RT felt it was due to sedation.  Sedation weaned 7/14 Hypotensive, vomiting, amiodarone turned off for low heart rate 7/16 Change to pressure control 7/17 Start pressors, PRBC transfusion, trach 7/18 Off pressors, PRBC transfusion 7/28- 10-12 hrs trach collar trial 7/29- recs from surgery for pancreatic lesions obtained 8/2 back on vent  8/12 tol ATC x8-10 hrs daily   STUDIES: 7/16 CT chest >> emphysema, b/l ASD, small effusions 7/16 CT abd/pelvis >> 2.6 x 2 cm nodule in head of pancreas, bone changes suggestive of Paget's disease  SUBJECTIVE:  Tolerating ATC x 8-10 hours daily.     VITAL SIGNS: Temp:  [97.6 F  (36.4 C)-99.2 F (37.3 C)] 97.9 F (36.6 C) (08/14 1234) Pulse Rate:  [68-93] 84 (08/14 1234) Resp:  [19-35] 26 (08/14 1234) BP: (95-115)/(51-80) 95/58 mmHg (08/14 1234) SpO2:  [94 %-100 %] 100 % (08/14 1234) FiO2 (%):  [28 %-40 %] 40 % (08/14 1234)  HEMODYNAMICS: CVP:  [2 mmHg-11 mmHg] 8 mmHg  VENTILATOR SETTINGS: Vent Mode:  [-] PRVC FiO2 (%):  [28 %-40 %] 40 % Set Rate:  [16 bmp] 16 bmp Vt Set:  [500 mL] 500 mL PEEP:  [5 cmH20] 5 cmH20 Plateau Pressure:  [17 cmH20-30 cmH20] 17 cmH20  INTAKE / OUTPUT: Intake/Output     08/13 0701 - 08/14 0700 08/14 0701 - 08/15 0700   I.V. (mL/kg) 480 (6.5) 100 (1.4)   NG/GT 1080 225   Total Intake(mL/kg) 1560 (21.3) 325 (4.4)   Urine (mL/kg/hr) 1250 (0.7) 775 (1.3)   Total Output 1250 775   Net +310 -450        Stool Occurrence 3 x     PHYSICAL EXAMINATION: Gen: Lying  in bed on trach collar, NAD  HEENT: PEG in place, trach site clean PULM: resps even non labored on ATC, cta CV: irregular, no m/r/g AB: soft, non tender, BS+ Ext: no edema Neuro: RASS 0, follows commands, MAE, gen weakness   LABS: CBC Recent Labs     12/19/12  0531  12/21/12  1000  WBC  15.0*  13.6*  HGB  7.6*  7.1*  HCT  22.8*  21.9*  PLT  183  203   Coag's Recent Labs     12/19/12  0531  12/20/12  0500  12/21/12  0500  INR  2.34*  2.38*  2.83*   BMET Recent Labs     12/19/12  0531  12/21/12  1000  NA  135  134*  K  4.0  4.0  CL  98  100  CO2  23  22  BUN  100*  80*  CREATININE  3.13*  2.45*  GLUCOSE  211*  350*   Electrolytes Recent Labs     12/19/12  0531  12/21/12  1000  CALCIUM  10.8*  10.9*  MG  2.1   --   PHOS  5.1*   --    Glucose Recent Labs     12/20/12  1613  12/20/12  2126  12/21/12  0048  12/21/12  0440  12/21/12  0723  12/21/12  1237  GLUCAP  188*  314*  219*  93  104*  336*   CXR: No results found.   ASSESSMENT / PLAN:    PULMONARY  A:  Acute respiratory failure 2nd to pulmonary infiltrates,  resolving. Effusions.  On and off vent. Tol ATC for long periods during day, does not tol 24hrs ATC.  P:   - PRN BD's. - Keep dry as tol. - Cont ATC as tolerated daytime with vent overnight. - Continue PO lasix.    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 A fib with RVR >> amiodarone d/c due to bradycardia, hypotension. Hx of CAD, HTN, hyperlipidemia. Shock >> resolved P:  - Anticoagulation managed by Cards. Coumadin. - Continue Cardizem PO for rate control   RENAL  Recent Labs Lab 12/17/12 0500 12/18/12 0500 12/19/12 0531 12/21/12 1000  CREATININE 2.55* 2.40* 3.13* 2.45*   A: Acute kidney injury >> renal fx stable. Hypernatremia  Resolved with increase water rx Hypokalemia - resolved Hypercalcemia improving Hyperphosphatemia resolved Mag WNL P:   - Discontinue free water. - Negative fluid balance as tol. - Strict I/O. - Lasix as ordered   GASTROINTESTINAL A:   Protein malnutrition Mass head of pancreas-- Will need further assessment of pancreatic lesion when stronger, likely in LTAC or as outpatient, Ca 19-9 elevated (73.1)- concern cancer P:   - Pepcid for SUP - Likely needs MRCP when stronger, tol ATC at all times - realistically little surgical interventions would be helpful at this point given overall health and tolerance of any major abdominal surgery.   GYN A:  Uterine prolapse. P: - GYN consulted 7/18 >  Dx is uterine prolapse.  Not surgical candidate.  Outpt GYN f/u.   HEMATOLOGIC  Recent Labs Lab 12/18/12 0500 12/19/12 0531 12/21/12 1000  HGB 8.3* 7.6* 7.1*    A:  Anemia of chronic disease and critical illness >> PRBC transfusion 7/17, 7/18. Thrombocytopenia - resolved  ? Dilutional anemia  Mild Leukocytosis - WBC 14.9 today, no fever. Likely hemoconcentration. P:  - Monitor CBC. - Continue coumadin for AFib. - Hg slowly dropping likely a combination of frequent phlebotomy and positive fluid, no  indication for transfusion at this time. - Limit phlebotomy.  INFECTIOUS A: Initial concern for aspiration pneumonia >> has persistent pulmonary infiltrates, and increasing WBC. P:   - No abx, follow fever curve specially that now patient is afebrile and WBC is improving.  ENDOCRINE A:  DM2, uncontrolled since 7/31 P:   - SSI, lantus   NEUROLOGIC A: Acute encephalopathy  2nd to hypoxia/hypercapnia. Muscular deconditioning. P:   - PT/OT, progress, especially as neuro better    Today's summary:  - awaiting placement  - Consider call GI for planned endo [place] as outpt only if she is markedly improved as may not be a candidate for any bx's much less rx if this is pancreatic ca at this point.  Patient is tolerating some TC wean, not tol ATC 24hours.  Ready for d/c to vent SNF for further treatment of the chronic stage of her illness.  *Care during the described time interval was provided by me and/or other providers on the critical care team. I have reviewed this patient's available data, including medical history, events of note, physical examination and test results as part of my evaluation.  Alyson Reedy, M.D. [facility] Pulmonary/Critical Care Medicine. Pager: [phone]. After hours pager: [phone].

## 2012-12-21 NOTE — Progress Notes (Signed)
Pt placed back on the vent in full support mode at this time due to thte pt was very agitated, had increased WOB, RR and HR as well as desaturations. Pt is now settling down and vitals are returning to normal. RT will monitor.

## 2012-12-21 NOTE — Progress Notes (Signed)
ANTICOAGULATION CONSULT NOTE - Follow Up Consult  Pharmacy Consult for Coumadin Indication: atrial fibrillation  No Known Allergies  Labs:  Recent Labs  12/19/12 0531 12/20/12 0500 12/21/12 0500  HGB 7.6*  --   --   HCT 22.8*  --   --   PLT 183  --   --   LABPROT 24.9* 25.2* 28.8*  INR 2.34* 2.38* 2.83*  CREATININE 3.13*  --   --     Estimated Creatinine Clearance: 15.2 ml/min (by C-G formula based on Cr of 3.13).   Assessment: 72 y/o female who continues on coumadin for anticoagulation with hx Afib. INR this morning is therapeutic. Will continue home regimen with lower dose of warfarin tonight. Will need to reassess continuing home regimen in am if upward trend continues. No bleeding issues noted.  Goal of Therapy:  INR 2-3   Plan:  1. Coumadin 5 mg MWF, 2.5 mg TTSS 2. PT/INR daily  Fredrik Rigger 12/21/2012, 9:11 AM

## 2012-12-21 NOTE — Progress Notes (Signed)
SLP Cancellation Note  Patient Details Name: [name] MRN: [id number] DOB: Nov 28, 1940   Cancelled treatment:        Pt inappropriate for po intake or objective study due to currently being on vent.  Will continue to follow and proceed as pt tolerates.  Di Jasmer B. Murvin [facility], CCC-SLP [phone]  Leigh Aurora 12/21/2012, 9:09 AM

## 2012-12-21 NOTE — Progress Notes (Signed)
eLink Physician-Brief Progress Note Patient Name: [name] DOB: [birth year] MRN: [id number]  Date of Service  12/21/2012   HPI/Events of Note   Pt pulled foley  eICU Interventions  On lasix, I O important, CHF - replace lasix   Intervention Category Minor Interventions: Routine modifications to care plan (e.g. PRN medications for pain, fever)  Nelda Bucks. 12/21/2012, 10:48 PM

## 2012-12-22 ENCOUNTER — Inpatient Hospital Stay (HOSPITAL_COMMUNITY): Payer: Medicare Other

## 2012-12-22 LAB — BLOOD GAS, ARTERIAL
Acid-base deficit: 6.1 mmol/L — ABNORMAL HIGH (ref 0.0–2.0)
Bicarbonate: 18.4 mEq/L — ABNORMAL LOW (ref 20.0–24.0)
FIO2: 0.3 %
MECHVT: 500 mL
O2 Saturation: 90.2 %
PEEP: 5 cmH2O
pCO2 arterial: 33.9 mmHg — ABNORMAL LOW (ref 35.0–45.0)
pH, Arterial: 7.355 (ref 7.350–7.450)
pO2, Arterial: 60.8 mmHg — ABNORMAL LOW (ref 80.0–100.0)

## 2012-12-22 LAB — PROTIME-INR: INR: 3.33 — ABNORMAL HIGH (ref 0.00–1.49)

## 2012-12-22 LAB — GLUCOSE, CAPILLARY: Glucose-Capillary: 350 mg/dL — ABNORMAL HIGH (ref 70–99)

## 2012-12-22 MED ORDER — INSULIN ASPART 100 UNIT/ML ~~LOC~~ SOLN: 0.0000 [IU] | SUBCUTANEOUS | Status: DC

## 2012-12-22 MED ORDER — FUROSEMIDE 40 MG PO TABS: 40.0000 mg | ORAL_TABLET | Freq: Every day | ORAL | Status: DC

## 2012-12-22 MED ORDER — CHLORHEXIDINE GLUCONATE 0.12 % MT SOLN: 15.0000 mL | Freq: Two times a day (BID) | OROMUCOSAL | Status: DC

## 2012-12-22 MED ORDER — HYDROCORTISONE SOD SUCCINATE 100 MG IJ SOLR: 25.0000 mg | Freq: Two times a day (BID) | INTRAMUSCULAR | Status: DC

## 2012-12-22 MED ORDER — FENTANYL CITRATE 0.05 MG/ML IJ SOLN
100.0000 ug | Freq: Once | INTRAMUSCULAR | Status: AC
  Administered 2012-12-22: 100 ug via INTRAVENOUS

## 2012-12-22 MED ORDER — METOPROLOL TARTRATE 25 MG/10 ML ORAL SUSPENSION: 37.5000 mg | Freq: Four times a day (QID) | ORAL | Status: DC

## 2012-12-22 MED ORDER — FAMOTIDINE 40 MG/5ML PO SUSR: 20.0000 mg | Freq: Every day | ORAL | Status: DC

## 2012-12-22 MED ORDER — LORAZEPAM 1 MG PO TABS: 1.0000 mg | ORAL_TABLET | Freq: Four times a day (QID) | ORAL | Status: DC | PRN

## 2012-12-22 MED ORDER — PRO-STAT SUGAR FREE PO LIQD: 30.0000 mL | Freq: Three times a day (TID) | ORAL | Status: DC

## 2012-12-22 MED ORDER — BIOTENE DRY MOUTH MT LIQD: 15.0000 mL | Freq: Four times a day (QID) | OROMUCOSAL | Status: DC

## 2012-12-22 MED ORDER — POTASSIUM CHLORIDE 20 MEQ/15ML (10%) PO LIQD: 20.0000 meq | Freq: Every day | ORAL | Status: DC

## 2012-12-22 MED ORDER — INSULIN GLARGINE 100 UNIT/ML ~~LOC~~ SOLN: 5.0000 [IU] | Freq: Every day | SUBCUTANEOUS | Status: DC

## 2012-12-22 MED ORDER — WARFARIN SODIUM 5 MG PO TABS: ORAL_TABLET | ORAL | Status: AC

## 2012-12-22 MED ORDER — OSMOLITE 1.2 CAL PO LIQD: 1000.0000 mL | ORAL | Status: AC

## 2012-12-22 MED ORDER — PREDNISONE (PAK) 10 MG PO TABS: 30.0000 mg | ORAL_TABLET | Freq: Every day | ORAL | Status: AC

## 2012-12-22 MED ORDER — ACETAMINOPHEN 160 MG/5ML PO SOLN: 650.0000 mg | Freq: Four times a day (QID) | ORAL | Status: AC | PRN

## 2012-12-22 MED ORDER — FENTANYL CITRATE 0.05 MG/ML IJ SOLN: 100.0000 ug | Freq: Once | INTRAMUSCULAR | Status: DC

## 2012-12-22 NOTE — Progress Notes (Signed)
E link paged due to  resp distress. Pt on the ventilator wtith TV of 500, resp 15, peep 5 30% 02. Has been medicated with Fentanyl 100mg , ativan 1mg  and versed 2mg . resp at the bedside for additional monitoring. Waiting E-link MD for orders.

## 2012-12-22 NOTE — Progress Notes (Signed)
Ok to post pone discharge until CXR and ABG resulted per Dr. Tyson Alias.

## 2012-12-22 NOTE — Progress Notes (Signed)
Inpatient Diabetes Program Recommendations  AACE/ADA: New Consensus Statement on Inpatient Glycemic Control (2013)  Target Ranges:  Prepandial:   less than 140 mg/dL      Peak postprandial:   less than 180 mg/dL (1-2 hours)      Critically ill patients:  140 - 180 mg/dL     Results for JODY, AGUINAGA (MRN [id number]) as of 12/22/2012 07:57  Ref. Range 12/21/2012 00:48 12/21/2012 04:40 12/21/2012 07:23 12/21/2012 12:37 12/21/2012 17:11 12/21/2012 17:13 12/21/2012 17:18 12/21/2012 18:46 12/21/2012 20:34  Glucose-Capillary Latest Range: 70-99 mg/dL 440 (H) 93 102 (H) 725 (H) 46 (L) 46 (L) 53 (L) 146 (H) 268 (H)     **Patient having wide fluctuations with her glucose levels.  Did not get any Novolog coverage at 7am for CBG of 104 mg/dl.  Next CBG at noon was 336 mg/dl.  Patient received 15 units of Novolog and then dropped low to 46 mg/dl at 5pm.    **MD- Please consider the following in-hospital insulin adjustments:  1. Decrease Novolog correction scale (SSI) to Moderate Q4 hours (currently on Resistant scale Q4 hours) 2. Add scheduled tube feed coverage- Novolog 3 units Q4 hours (hold if tube feeds held for any reason)  Will follow. Ambrose Finland RN, MSN, CDE Diabetes Coordinator Inpatient Diabetes Program [phone]

## 2012-12-22 NOTE — Progress Notes (Signed)
CSW was contacted by CM stating that Pt was given an authorization from [place] to transfer over to [facility] today.   CSW contacted [facility] Misty Stanley) admission's coordinator concerning transfer.   Misty Stanley will contact insurance company to confirm authorization and contact CSW back for approval for transfer.   CSW to follow for d/c planning to [facility] today.   Leron Croak, LCSWA [facility].  [phone]

## 2012-12-22 NOTE — Progress Notes (Signed)
ANTICOAGULATION CONSULT NOTE - Follow Up Consult  Pharmacy Consult for Coumadin Indication: atrial fibrillation  No Known Allergies  Labs:  Recent Labs  12/20/12 0500 12/21/12 0500 12/21/12 1000 12/22/12 0500  HGB  --   --  7.1*  --   HCT  --   --  21.9*  --   PLT  --   --  203  --   LABPROT 25.2* 28.8*  --  32.6*  INR 2.38* 2.83*  --  3.33*  CREATININE  --   --  2.45*  --     Estimated Creatinine Clearance: 19.5 ml/min (by C-G formula based on Cr of 2.45).   Assessment: 72 y/o female who continues on coumadin for anticoagulation with hx Afib. INR this morning is supratherapeutic (continues upward trend). No bleeding issues noted.  Goal of Therapy:  INR 2-3   Plan:  1. No coumadin tonight 2. PT/INR daily  Christoper Fabian, PharmD, BCPS Clinical pharmacist, pager [phone] 12/22/2012  9:08 AM

## 2012-12-22 NOTE — Progress Notes (Signed)
Pt ts to [facility] via [facility].  Dtr. Monique informed and agreeable.

## 2012-12-22 NOTE — Progress Notes (Signed)
SUBJECTIVE:  Trached.  She does not answer my questions.     PHYSICAL EXAM Filed Vitals:   12/22/12 1259 12/22/12 1437 12/22/12 1618 12/22/12 1642  BP: 107/62 137/108 79/57 78/48  Pulse: 85  79   Temp: 97.8 F (36.6 C)  98.2 F (36.8 C)   TempSrc: Axillary  Axillary   Resp: 36  29   Height:      Weight:      SpO2: 94%  95%    General:  No distress Lungs:  Decreased breath sounds Heart:  Irregular Abdomen:  Positive bowel sounds, no rebound no guarding Extremities:  No edema   LABS:  Results for orders placed during the hospital encounter of 11/09/12 (from the past 24 hour(s))  GLUCOSE, CAPILLARY     Status: Abnormal   Collection Time    12/21/12  8:34 PM      Result Value Range   Glucose-Capillary 268 (*) 70 - 99 mg/dL  GLUCOSE, CAPILLARY     Status: Abnormal   Collection Time    12/22/12 12:15 AM      Result Value Range   Glucose-Capillary 209 (*) 70 - 99 mg/dL  PROTIME-INR     Status: Abnormal   Collection Time    12/22/12  5:00 AM      Result Value Range   Prothrombin Time 32.6 (*) 11.6 - 15.2 seconds   INR 3.33 (*) 0.00 - 1.49  GLUCOSE, CAPILLARY     Status: Abnormal   Collection Time    12/22/12  5:15 AM      Result Value Range   Glucose-Capillary 138 (*) 70 - 99 mg/dL  GLUCOSE, CAPILLARY     Status: Abnormal   Collection Time    12/22/12  7:37 AM      Result Value Range   Glucose-Capillary 184 (*) 70 - 99 mg/dL  GLUCOSE, CAPILLARY     Status: Abnormal   Collection Time    12/22/12 12:55 PM      Result Value Range   Glucose-Capillary 239 (*) 70 - 99 mg/dL   Comment 1 Notify RN    BLOOD GAS, ARTERIAL     Status: Abnormal   Collection Time    12/22/12  4:25 PM      Result Value Range   FIO2 0.30     Delivery systems VENTILATOR     Mode PRESSURE REGULATED VOLUME CONTROL     VT 500.0     Rate 16.0     Peep/cpap 5.0     pH, Arterial 7.355  7.350 - 7.450   pCO2 arterial 33.9 (*) 35.0 - 45.0 mmHg   pO2, Arterial 60.8 (*) 80.0 - 100.0 mmHg   Bicarbonate 18.4 (*) 20.0 - 24.0 mEq/L   TCO2 19.5  0 - 100 mmol/L   Acid-base deficit 6.1 (*) 0.0 - 2.0 mmol/L   O2 Saturation 90.2     Patient temperature 98.6     Collection site LEFT RADIAL     Drawn by [id number]     Sample type ARTERIAL DRAW     Allens test (pass/fail) PASS  PASS  GLUCOSE, CAPILLARY     Status: Abnormal   Collection Time    12/22/12  4:28 PM      Result Value Range   Glucose-Capillary 350 (*) 70 - 99 mg/dL    Intake/Output Summary (Last 24 hours) at 12/22/12 1855 Last data filed at 12/22/12 0500  Gross per 24 hour  Intake  715 ml  Output    800 ml  Net    -85 ml    ASSESSMENT AND PLAN:  Acute on chronic diastolic congestive heart failure:   Back on home dose of Lasix PO.  Continue.    CHRONIC KIDNEY DISEASE STAGE V:  Creat is stable.  Continue current therapies.   FIBRILLATION, ATRIAL:  Continue rate control and anticoagulation for now.    Acute respiratory failure with hypoxia:  Status post trach.  Transfer to long term care today.     Rollene Rotunda 12/22/2012 6:55 PM

## 2012-12-22 NOTE — Progress Notes (Signed)
PULMONARY  / CRITICAL CARE MEDICINE  Name: [name] MRN: [id number] DOB: 07/25/1940    ADMISSION DATE:  11/09/2012 CONSULTATION DATE:  11/09/2012  REFERRING MD :  Preston Fleeting PRIMARY SERVICE: PCCM  CHIEF COMPLAINT:  Shortness of breath.  BRIEF PATIENT DESCRIPTION: 72 y/o female with CHF was admitted on 7/3 from the [facility] ED with acute hypoxemic respiratory failure due to a CHF exacerbation, ?Superimposed PNA.  LINES / TUBES: 7/3 ETT >>11/13/12, 11/13/12  (failed extubation immediately, ? aspirated) >> 7/17 7/4 CVC R IJ >> 7/17 7/5 A-line >> 7/18 7/17 Trach (DF) >> downsized to #4 8/1 > replaced with # 6 cuffed 8/2 for resp distrss 7/17 Rt PICC >>  7/22 PEG >>>  CULTURES: 7/4 U strep >> neg 7/4 legionella >> neg 7/3 mrsa PCR >> POSITIVE 7/8 mini-BAL >> yeast 7/16 sputum >> yeast  7/16 blood >> neg 7/16 urine >> yeast 50 K colonies 7/26 C. Diff>>>neg  ANTIBIOTICS: 7/3 Ceftriaxone >>7/6 7/4 levofloxacin >>7/7 7/4 Vancomycin >>7/6, restarted 7/8 >> 7/18 7/7 Unasyn >> 7/16 7/16 cefepime >> 7/24  SIGNIFICANT EVENTS: 7/3 admission 7/07 reintubated immediately after extubation.  ? Aspiration. ABX restarted.  on Neo-Synephrine.  7/09 Able to wean fio2 and neo. Lopressor required for rate control of afib. 7/10 Off neo, but now tachycardic.  Failed SBT this AM, but RT felt it was due to sedation.  Sedation weaned 7/14 Hypotensive, vomiting, amiodarone turned off for low heart rate 7/16 Change to pressure control 7/17 Start pressors, PRBC transfusion, trach 7/18 Off pressors, PRBC transfusion 7/28- 10-12 hrs trach collar trial 7/29- recs from surgery for pancreatic lesions obtained 8/2 back on vent  8/12 tol ATC x8-10 hrs daily   STUDIES: 7/16 CT chest >> emphysema, b/l ASD, small effusions 7/16 CT abd/pelvis >> 2.6 x 2 cm nodule in head of pancreas, bone changes suggestive of Paget's disease  SUBJECTIVE:  Tolerating ATC x 8-10 hours daily.     VITAL SIGNS: Temp:  [97.5 F  (36.4 C)-98 F (36.7 C)] 97.7 F (36.5 C) (08/15 0700) Pulse Rate:  [63-84] 64 (08/15 0700) Resp:  [22-35] 22 (08/15 0700) BP: (81-105)/(51-60) 87/59 mmHg (08/15 0743) SpO2:  [100 %] 100 % (08/15 0700) FiO2 (%):  [30 %-40 %] 30 % (08/15 0743) Weight:  [73.7 kg (162 lb 7.7 oz)] 73.7 kg (162 lb 7.7 oz) (08/15 0600)  HEMODYNAMICS: CVP:  [8 mmHg-17 mmHg] 10 mmHg  VENTILATOR SETTINGS: Vent Mode:  [-] PRVC FiO2 (%):  [30 %-40 %] 30 % Set Rate:  [14 bmp-16 bmp] 16 bmp Vt Set:  [500 mL] 500 mL PEEP:  [5 cmH20] 5 cmH20 Plateau Pressure:  [17 cmH20-21 cmH20] 20 cmH20  INTAKE / OUTPUT: Intake/Output     08/14 0701 - 08/15 0700 08/15 0701 - 08/16 0700   I.V. (mL/kg) 320 (4.3)    NG/GT 720    Total Intake(mL/kg) 1040 (14.1)    Urine (mL/kg/hr) 1575 (0.9)    Total Output 1575     Net -535          Stool Occurrence 5 x     PHYSICAL EXAMINATION: Gen: Lying  in bed on trach collar, NAD  HEENT: PEG in place, trach site clean PULM: resps even non labored on ATC, cta CV: irregular, no m/r/g AB: soft, non tender, BS+ Ext: no edema Neuro: RASS 0, follows commands, MAE, gen weakness   LABS: CBC Recent Labs     12/21/12  1000  WBC  13.6*  HGB  7.1*  HCT  21.9*  PLT  203   Coag's Recent Labs     12/20/12  0500  12/21/12  0500  12/22/12  0500  INR  2.38*  2.83*  3.33*   BMET Recent Labs     12/21/12  1000  NA  134*  K  4.0  CL  100  CO2  22  BUN  80*  CREATININE  2.45*  GLUCOSE  350*   Electrolytes Recent Labs     12/21/12  1000  CALCIUM  10.9*   Glucose Recent Labs     12/21/12  1718  12/21/12  1846  12/21/12  2034  12/22/12  0015  12/22/12  0515  12/22/12  0737  GLUCAP  53*  146*  268*  209*  138*  184*   CXR: No results found.   ASSESSMENT / PLAN:    PULMONARY  A:  Acute respiratory failure 2nd to pulmonary infiltrates, resolving. Effusions.  On and off vent. Tol ATC for long periods during day, does not tol 24hrs ATC.  P:   - PRN BD's. -  Keep dry as tol. - Cont ATC as tolerated daytime with vent overnight. - Continue PO lasix.    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 A fib with RVR >> amiodarone d/c due to bradycardia, hypotension. Hx of CAD, HTN, hyperlipidemia. Shock >> resolved P:  - Anticoagulation managed by Cards. Coumadin. - Continue Cardizem PO for rate control   RENAL  Recent Labs Lab 12/17/12 0500 12/18/12 0500 12/19/12 0531 12/21/12 1000  CREATININE 2.55* 2.40* 3.13* 2.45*   A: Acute kidney injury >> renal fx stable. Hypernatremia  Resolved with increase water rx Hypokalemia - resolved Hypercalcemia improving Hyperphosphatemia resolved Mag WNL P:   - Discontinue free water. - Negative fluid balance as tol. - Strict I/O. - Lasix as ordered   GASTROINTESTINAL A:   Protein malnutrition Mass head of pancreas-- Will need further assessment of pancreatic lesion when stronger, likely in LTAC or as outpatient, Ca 19-9 elevated (73.1)- concern cancer P:   - Pepcid for SUP - Likely needs MRCP when stronger, tol ATC at all times - realistically little surgical interventions would be helpful at this point given overall health and tolerance of any major abdominal surgery.   GYN A:  Uterine prolapse. P: - GYN consulted 7/18 >  Dx is uterine prolapse.  Not surgical candidate.  Outpt GYN f/u.   HEMATOLOGIC  Recent Labs Lab 12/18/12 0500 12/19/12 0531 12/21/12 1000  HGB 8.3* 7.6* 7.1*    A:  Anemia of chronic disease and critical illness >> PRBC transfusion 7/17, 7/18. Thrombocytopenia - resolved  ? Dilutional anemia  Mild Leukocytosis - WBC 14.9 today, no fever. Likely hemoconcentration. P:  - Monitor CBC. - Continue coumadin for AFib. - Hg slowly dropping likely a combination of frequent phlebotomy and positive fluid, no indication for transfusion at this time. - Limit phlebotomy.  INFECTIOUS A: Initial concern for aspiration pneumonia >> has  persistent pulmonary infiltrates, and increasing WBC. P:   - No abx, follow fever curve specially that now patient is afebrile and WBC is improving.  ENDOCRINE A:  DM2, uncontrolled since 7/31 P:   - SSI, lantus   NEUROLOGIC A: Acute encephalopathy 2nd to hypoxia/hypercapnia. Muscular deconditioning. P:   - PT/OT, progress, especially as neuro better    Today's summary:  - awaiting placement  - Consider call GI for planned endo [place] as outpt only  if she is markedly improved as may not be a candidate for any bx's much less rx if this is pancreatic ca at this point.  Patient is tolerating some TC wean, not tol ATC 24hours.  Ready for d/c to vent SNF for further treatment of the chronic stage of her illness.  *Care during the described time interval was provided by me and/or other providers on the critical care team. I have reviewed this patient's available data, including medical history, events of note, physical examination and test results as part of my evaluation.  Alyson Reedy, M.D. [facility] Pulmonary/Critical Care Medicine. Pager: [phone]. After hours pager: [phone].

## 2012-12-22 NOTE — Progress Notes (Signed)
Called [facility] for transport 

## 2012-12-22 NOTE — Progress Notes (Signed)
Daughter [name] called and given update on condition.

## 2012-12-22 NOTE — Progress Notes (Signed)
Pt placed back obn the vent at thios time due to pi in distress with an increased WOB, RR, HR and  desaturations. RT will monitor.

## 2012-12-22 NOTE — Progress Notes (Signed)
Called Dr. Tyson Alias regarding patient transfer status. He will notify Dr. Sung Amabile who will determine disposition. B/p 88/62.Marland Kitchenpulse 118; resp 36.

## 2012-12-22 NOTE — Clinical Social Work Note (Signed)
CSW provided updated clinicals to [place]. CSW spoke to [facility] to provide update and gain updated information re: pt disposition and plan of care.  CSW continues to seek insurance authorization for pt to be admitted to vent SNF.  Vent SNF bed is currently available at [facility], but pt will likely lose this bed if the authorization is not received today.    Vickii Penna, LCSWA [phone]  Clinical Social Work

## 2012-12-22 NOTE — Progress Notes (Signed)
Dr Tyson Alias in to see. Aware of vs. Noted drop in b/p 79/57. CXR ordered and STAT ABG's ordered. Fentanyl 100mg  given per order of Dr. Sung Amabile. Resp at bed side for ABG.

## 2012-12-22 NOTE — Progress Notes (Signed)
SLP Cancellation Note  Patient Details Name: [name] MRN: [id number] DOB: 1941/05/07   Cancelled treatment:        Pt continues on vent.  Will recheck next week; please page if needs arise sooner. Khyri Hinzman B. Murvin Natal [facility], CCC-SLP [phone]  Leigh Aurora 12/22/2012, 9:19 AM

## 2012-12-22 NOTE — Progress Notes (Signed)
[facility] called for report. Waiting pending CXR and Abg results.

## 2012-12-22 NOTE — Progress Notes (Signed)
Spoke with Dr. Sung Amabile. Vent settings verified. Diminished lung sounds bil. Called Dr. Tyson Alias to see. B/p 142/88.

## 2012-12-22 NOTE — Progress Notes (Signed)
Ok for discharge to [facility] per Dr. Sung Amabile.

## 2012-12-23 LAB — GLUCOSE, CAPILLARY: Glucose-Capillary: 307 mg/dL — ABNORMAL HIGH (ref 70–99)

## 2012-12-25 NOTE — Clinical Social Work Placement (Signed)
Pt transferred to [facility] on 12/21/2012 per chart review: [facility] via [facility].

## 2012-12-26 NOTE — Discharge Summary (Signed)
Rush Farmer, M.D. [facility] Pulmonary/Critical Care Medicine. Pager: [phone]. After hours pager: [phone].

## 2012-12-27 ENCOUNTER — Telehealth: Payer: Self-pay | Admitting: Pulmonary Disease

## 2012-12-27 NOTE — Telephone Encounter (Signed)
I spoke with Dena. She stated pt was transferred to them from [facility]. They only have 1 consult note and it mentions about GYN consult on 11/24/12. She asked if we can fax that over since pt has vaginial prolapse and is needing some type of direction. She gave me fax# [phone]. I have faxed this over to her. Nothing further was needed

## 2012-12-28 ENCOUNTER — Emergency Department (HOSPITAL_COMMUNITY): Payer: Medicare Other

## 2012-12-28 ENCOUNTER — Inpatient Hospital Stay (HOSPITAL_COMMUNITY)
Admission: EM | Admit: 2012-12-28 | Discharge: 2013-01-08 | Disposition: E | Payer: Medicare Other | Source: Home / Self Care | Attending: Pulmonary Disease | Admitting: Pulmonary Disease

## 2012-12-28 DIAGNOSIS — E872 Acidosis, unspecified: Secondary | ICD-10-CM | POA: Diagnosis present

## 2012-12-28 DIAGNOSIS — Z794 Long term (current) use of insulin: Secondary | ICD-10-CM

## 2012-12-28 DIAGNOSIS — E875 Hyperkalemia: Secondary | ICD-10-CM | POA: Diagnosis present

## 2012-12-28 DIAGNOSIS — L89309 Pressure ulcer of unspecified buttock, unspecified stage: Secondary | ICD-10-CM | POA: Diagnosis present

## 2012-12-28 DIAGNOSIS — I4891 Unspecified atrial fibrillation: Secondary | ICD-10-CM | POA: Diagnosis present

## 2012-12-28 DIAGNOSIS — Z7901 Long term (current) use of anticoagulants: Secondary | ICD-10-CM

## 2012-12-28 DIAGNOSIS — N185 Chronic kidney disease, stage 5: Secondary | ICD-10-CM | POA: Diagnosis present

## 2012-12-28 DIAGNOSIS — J189 Pneumonia, unspecified organism: Secondary | ICD-10-CM | POA: Diagnosis present

## 2012-12-28 DIAGNOSIS — R739 Hyperglycemia, unspecified: Secondary | ICD-10-CM | POA: Diagnosis present

## 2012-12-28 DIAGNOSIS — A419 Sepsis, unspecified organism: Secondary | ICD-10-CM | POA: Diagnosis present

## 2012-12-28 DIAGNOSIS — I120 Hypertensive chronic kidney disease with stage 5 chronic kidney disease or end stage renal disease: Secondary | ICD-10-CM | POA: Diagnosis present

## 2012-12-28 DIAGNOSIS — J9601 Acute respiratory failure with hypoxia: Secondary | ICD-10-CM

## 2012-12-28 DIAGNOSIS — I509 Heart failure, unspecified: Secondary | ICD-10-CM

## 2012-12-28 DIAGNOSIS — J9600 Acute respiratory failure, unspecified whether with hypoxia or hypercapnia: Secondary | ICD-10-CM

## 2012-12-28 DIAGNOSIS — Z8614 Personal history of Methicillin resistant Staphylococcus aureus infection: Secondary | ICD-10-CM

## 2012-12-28 DIAGNOSIS — I428 Other cardiomyopathies: Secondary | ICD-10-CM | POA: Diagnosis present

## 2012-12-28 DIAGNOSIS — Z87891 Personal history of nicotine dependence: Secondary | ICD-10-CM

## 2012-12-28 DIAGNOSIS — E119 Type 2 diabetes mellitus without complications: Secondary | ICD-10-CM | POA: Diagnosis present

## 2012-12-28 DIAGNOSIS — J9620 Acute and chronic respiratory failure, unspecified whether with hypoxia or hypercapnia: Secondary | ICD-10-CM | POA: Diagnosis present

## 2012-12-28 DIAGNOSIS — Z930 Tracheostomy status: Secondary | ICD-10-CM

## 2012-12-28 DIAGNOSIS — L8990 Pressure ulcer of unspecified site, unspecified stage: Secondary | ICD-10-CM | POA: Diagnosis present

## 2012-12-28 DIAGNOSIS — N814 Uterovaginal prolapse, unspecified: Secondary | ICD-10-CM | POA: Diagnosis present

## 2012-12-28 DIAGNOSIS — J449 Chronic obstructive pulmonary disease, unspecified: Secondary | ICD-10-CM | POA: Diagnosis present

## 2012-12-28 DIAGNOSIS — Z66 Do not resuscitate: Secondary | ICD-10-CM | POA: Diagnosis not present

## 2012-12-28 DIAGNOSIS — J4489 Other specified chronic obstructive pulmonary disease: Secondary | ICD-10-CM | POA: Diagnosis present

## 2012-12-28 DIAGNOSIS — E785 Hyperlipidemia, unspecified: Secondary | ICD-10-CM | POA: Diagnosis present

## 2012-12-28 DIAGNOSIS — I421 Obstructive hypertrophic cardiomyopathy: Secondary | ICD-10-CM | POA: Diagnosis present

## 2012-12-28 DIAGNOSIS — I5033 Acute on chronic diastolic (congestive) heart failure: Secondary | ICD-10-CM

## 2012-12-28 DIAGNOSIS — I447 Left bundle-branch block, unspecified: Secondary | ICD-10-CM | POA: Diagnosis present

## 2012-12-28 DIAGNOSIS — Z79899 Other long term (current) drug therapy: Secondary | ICD-10-CM

## 2012-12-28 DIAGNOSIS — D509 Iron deficiency anemia, unspecified: Secondary | ICD-10-CM | POA: Diagnosis present

## 2012-12-28 DIAGNOSIS — K869 Disease of pancreas, unspecified: Secondary | ICD-10-CM | POA: Diagnosis present

## 2012-12-28 DIAGNOSIS — Z9911 Dependence on respirator [ventilator] status: Secondary | ICD-10-CM

## 2012-12-28 DIAGNOSIS — Z515 Encounter for palliative care: Secondary | ICD-10-CM

## 2012-12-28 DIAGNOSIS — G9340 Encephalopathy, unspecified: Secondary | ICD-10-CM | POA: Diagnosis present

## 2012-12-28 DIAGNOSIS — Z931 Gastrostomy status: Secondary | ICD-10-CM

## 2012-12-28 LAB — COMPREHENSIVE METABOLIC PANEL
AST: 39 U/L — ABNORMAL HIGH (ref 0–37)
Albumin: 2 g/dL — ABNORMAL LOW (ref 3.5–5.2)
Alkaline Phosphatase: 134 U/L — ABNORMAL HIGH (ref 39–117)
Chloride: 107 mEq/L (ref 96–112)
Potassium: 6.2 mEq/L — ABNORMAL HIGH (ref 3.5–5.1)
Sodium: 139 mEq/L (ref 135–145)
Total Bilirubin: 0.3 mg/dL (ref 0.3–1.2)

## 2012-12-28 LAB — BLOOD GAS, ARTERIAL
Acid-base deficit: 10.8 mmol/L — ABNORMAL HIGH (ref 0.0–2.0)
Bicarbonate: 17.2 mEq/L — ABNORMAL LOW (ref 20.0–24.0)
FIO2: 0.8 %
O2 Saturation: 77.7 %
TCO2: 19 mmol/L (ref 0–100)
pO2, Arterial: 53.2 mmHg — ABNORMAL LOW (ref 80.0–100.0)

## 2012-12-28 LAB — URINALYSIS, ROUTINE W REFLEX MICROSCOPIC
Bilirubin Urine: NEGATIVE
Glucose, UA: >1000 mg/dL — AB
Ketones, ur: NEGATIVE mg/dL
pH: 5 (ref 5.0–8.0)

## 2012-12-28 LAB — PROTIME-INR: INR: 2.49 — ABNORMAL HIGH (ref 0.00–1.49)

## 2012-12-28 LAB — POCT I-STAT 3, ART BLOOD GAS (G3+)
Bicarbonate: 15.5 mEq/L — ABNORMAL LOW (ref 20.0–24.0)
Patient temperature: 101.7
TCO2: 17 mmol/L (ref 0–100)
pCO2 arterial: 47.3 mmHg — ABNORMAL HIGH (ref 35.0–45.0)
pH, Arterial: 7.135 — CL (ref 7.350–7.450)

## 2012-12-28 LAB — TYPE AND SCREEN
ABO/RH(D): A POS
Antibody Screen: NEGATIVE

## 2012-12-28 LAB — URINE MICROSCOPIC-ADD ON

## 2012-12-28 LAB — CBC
Platelets: 303 10*3/uL (ref 150–400)
RDW: 20 % — ABNORMAL HIGH (ref 11.5–15.5)
WBC: 11.3 10*3/uL — ABNORMAL HIGH (ref 4.0–10.5)

## 2012-12-28 LAB — FIBRINOGEN: Fibrinogen: 670 mg/dL — ABNORMAL HIGH (ref 204–475)

## 2012-12-28 MED ORDER — PHENYLEPHRINE HCL 10 MG/ML IJ SOLN
30.0000 ug/min | INTRAVENOUS | Status: DC
  Administered 2012-12-28 – 2012-12-29 (×5): 300 ug/min via INTRAVENOUS
  Administered 2012-12-29: 200 ug/min via INTRAVENOUS
  Filled 2012-12-28 (×7): qty 4

## 2012-12-28 MED ORDER — DEXTROSE 5 % IV SOLN
1.0000 g | INTRAVENOUS | Status: AC
  Administered 2012-12-28: 1 g via INTRAVENOUS
  Filled 2012-12-28: qty 1

## 2012-12-28 MED ORDER — SODIUM CHLORIDE 0.9 % IV SOLN: 1.0000 g | Freq: Once | INTRAVENOUS | Status: DC

## 2012-12-28 MED ORDER — ASPIRIN 300 MG RE SUPP
300.0000 mg | RECTAL | Status: AC
  Administered 2012-12-29: 300 mg via RECTAL
  Filled 2012-12-28: qty 1

## 2012-12-28 MED ORDER — FENTANYL BOLUS VIA INFUSION
25.0000 ug | Freq: Four times a day (QID) | INTRAVENOUS | Status: DC | PRN
  Administered 2012-12-28: 50 ug via INTRAVENOUS
  Filled 2012-12-28: qty 100

## 2012-12-28 MED ORDER — SODIUM CHLORIDE 0.9 % IV SOLN
INTRAVENOUS | Status: DC
  Administered 2012-12-28: 3.4 [IU]/h via INTRAVENOUS
  Filled 2012-12-28: qty 1

## 2012-12-28 MED ORDER — CIPROFLOXACIN IN D5W 400 MG/200ML IV SOLN: 400.0000 mg | Freq: Two times a day (BID) | INTRAVENOUS | Status: DC

## 2012-12-28 MED ORDER — SODIUM BICARBONATE 8.4 % IV SOLN
100.0000 meq | Freq: Once | INTRAVENOUS | Status: AC
  Administered 2012-12-28: 100 meq via INTRAVENOUS

## 2012-12-28 MED ORDER — INSULIN ASPART 100 UNIT/ML ~~LOC~~ SOLN
5.0000 [IU] | Freq: Once | SUBCUTANEOUS | Status: AC
  Administered 2012-12-28: 5 [IU] via INTRAVENOUS
  Filled 2012-12-28: qty 1

## 2012-12-28 MED ORDER — CIPROFLOXACIN IN D5W 400 MG/200ML IV SOLN
400.0000 mg | INTRAVENOUS | Status: DC
  Filled 2012-12-28: qty 200

## 2012-12-28 MED ORDER — ALBUTEROL SULFATE HFA 108 (90 BASE) MCG/ACT IN AERS: 8.0000 | INHALATION_SPRAY | RESPIRATORY_TRACT | Status: DC | PRN

## 2012-12-28 MED ORDER — ROCURONIUM BROMIDE 50 MG/5ML IV SOLN
50.0000 mg | Freq: Once | INTRAVENOUS | Status: AC
  Administered 2012-12-28: 50 mg via INTRAVENOUS
  Filled 2012-12-28: qty 5

## 2012-12-28 MED ORDER — ALBUTEROL SULFATE HFA 108 (90 BASE) MCG/ACT IN AERS
8.0000 | INHALATION_SPRAY | RESPIRATORY_TRACT | Status: DC
  Administered 2012-12-29 (×4): 8 via RESPIRATORY_TRACT
  Filled 2012-12-28: qty 6.7

## 2012-12-28 MED ORDER — SODIUM CHLORIDE 0.9 % IV BOLUS (SEPSIS)
2000.0000 mL | INTRAVENOUS | Status: DC | PRN
  Administered 2012-12-28: 2000 mL via INTRAVENOUS

## 2012-12-28 MED ORDER — SODIUM CHLORIDE 0.9 % IV SOLN
1.0000 g | Freq: Once | INTRAVENOUS | Status: AC
  Administered 2012-12-28: 1 g via INTRAVENOUS
  Filled 2012-12-28: qty 10

## 2012-12-28 MED ORDER — PANTOPRAZOLE SODIUM 40 MG IV SOLR
40.0000 mg | Freq: Every day | INTRAVENOUS | Status: DC
  Administered 2012-12-29: 40 mg via INTRAVENOUS
  Filled 2012-12-28 (×2): qty 40

## 2012-12-28 MED ORDER — DEXTROSE 50 % IV SOLN
25.0000 mL | Freq: Once | INTRAVENOUS | Status: AC
  Administered 2012-12-28: 25 mL via INTRAVENOUS

## 2012-12-28 MED ORDER — FENTANYL CITRATE 0.05 MG/ML IJ SOLN
25.0000 ug/h | INTRAMUSCULAR | Status: DC
  Administered 2012-12-28: 50 ug/h via INTRAVENOUS
  Filled 2012-12-28: qty 50

## 2012-12-28 MED ORDER — HEPARIN SODIUM (PORCINE) 5000 UNIT/ML IJ SOLN
5000.0000 [IU] | Freq: Three times a day (TID) | INTRAMUSCULAR | Status: DC
  Administered 2012-12-29 (×2): 5000 [IU] via SUBCUTANEOUS
  Filled 2012-12-28 (×5): qty 1

## 2012-12-28 MED ORDER — SODIUM CHLORIDE 0.9 % IV BOLUS (SEPSIS)
1000.0000 mL | INTRAVENOUS | Status: DC | PRN
  Administered 2012-12-28: 1000 mL via INTRAVENOUS

## 2012-12-28 MED ORDER — SODIUM CHLORIDE 0.9 % IV SOLN
1.0000 mg/h | INTRAVENOUS | Status: DC
  Administered 2012-12-28: 1 mg/h via INTRAVENOUS
  Filled 2012-12-28: qty 10

## 2012-12-28 MED ORDER — VANCOMYCIN HCL 10 G IV SOLR
1500.0000 mg | Freq: Once | INTRAVENOUS | Status: AC
  Administered 2012-12-28: 1500 mg via INTRAVENOUS
  Filled 2012-12-28: qty 1500

## 2012-12-28 MED ORDER — VASOPRESSIN 20 UNIT/ML IJ SOLN
0.0300 [IU]/min | INTRAVENOUS | Status: DC
  Administered 2012-12-28: 0.03 [IU]/min via INTRAVENOUS
  Filled 2012-12-28: qty 2.5

## 2012-12-28 MED ORDER — AZITHROMYCIN 500 MG IV SOLR
500.0000 mg | Freq: Once | INTRAVENOUS | Status: DC
  Filled 2012-12-28: qty 500

## 2012-12-28 MED ORDER — SODIUM BICARBONATE 8.4 % IV SOLN
INTRAVENOUS | Status: AC
  Filled 2012-12-28: qty 100

## 2012-12-28 MED ORDER — PHENYLEPHRINE HCL 10 MG/ML IJ SOLN
30.0000 ug/min | INTRAVENOUS | Status: DC
  Administered 2012-12-28: 30 ug/min via INTRAVENOUS
  Filled 2012-12-28: qty 1

## 2012-12-28 MED ORDER — DEXTROSE 5 % IV SOLN: 1.0000 g | INTRAVENOUS | Status: DC

## 2012-12-28 MED ORDER — SODIUM CHLORIDE 0.9 % IV SOLN
500.0000 mg | Freq: Two times a day (BID) | INTRAVENOUS | Status: DC
  Administered 2012-12-28 – 2012-12-29 (×2): 500 mg via INTRAVENOUS
  Filled 2012-12-28 (×3): qty 0.5

## 2012-12-28 MED ORDER — MIDAZOLAM HCL 2 MG/2ML IJ SOLN: 1.0000 mg | INTRAMUSCULAR | Status: DC | PRN

## 2012-12-28 MED ORDER — ASPIRIN 81 MG PO CHEW: 324.0000 mg | CHEWABLE_TABLET | ORAL | Status: AC

## 2012-12-28 MED ORDER — ALBUTEROL SULFATE (5 MG/ML) 0.5% IN NEBU
2.5000 mg | INHALATION_SOLUTION | Freq: Once | RESPIRATORY_TRACT | Status: AC
  Administered 2012-12-28: 2.5 mg via RESPIRATORY_TRACT
  Filled 2012-12-28: qty 0.5

## 2012-12-28 MED ORDER — ACETAMINOPHEN 650 MG RE SUPP
650.0000 mg | Freq: Once | RECTAL | Status: AC
  Administered 2012-12-28: 650 mg via RECTAL
  Filled 2012-12-28: qty 1

## 2012-12-28 MED ORDER — SODIUM CHLORIDE 0.9 % IV SOLN: 250.0000 mL | INTRAVENOUS | Status: DC | PRN

## 2012-12-28 NOTE — ED Notes (Addendum)
Per EMS: Pt from [facility]. Facility sent patient in for possible Sepsis. Rales heard throughout lung sounds. Pt sometimes withdrawals from pain. Hr: 130, 159/120.

## 2012-12-28 NOTE — Progress Notes (Signed)
Pt transported on vent from ED to 2108. No complications.

## 2012-12-28 NOTE — H&P (Signed)
PULMONARY  / CRITICAL CARE MEDICINE  Name: [name] MRN: [id number] DOB: 1940-11-23    ADMISSION DATE:  01/17/2013 CONSULTATION DATE:  01/17/2013  REFERRING MD :  Charline Bills PRIMARY SERVICE: PCCM  CHIEF COMPLAINT:  Septic shock, HCAP  BRIEF PATIENT DESCRIPTION: 72 y/o female currently staying at [facility] with CHF, HOCM recently discharged from this facility for VDRF in the setting of CHF exacerbation and HCAP was admitted on 8/21 from the [facility] ED with septic shock from HCAP.  SIGNIFICANT EVENTS / STUDIES:  8/21 admission  LINES / TUBES: 7/17 Trach (DF) >> downsized to #4 8/1 > replaced with # 6 cuffed 8/2 for resp distrss  7/17 Rt PICC >>  7/22 PEG >>  CULTURES: 8/21 resp culture >> 8/21 blood culture >> 8/21 urine culture >>  ANTIBIOTICS: 8/21 vanc >> 8/21 mero >> 8/21 cipro >> 8/21 cefepime x1 8/21 azithro x1  HISTORY OF PRESENT ILLNESS: 72 y/o female currently staying at [facility] with CHF, HOCM recently discharged from this facility for VDRF in the setting of CHF exacerbation and HCAP was admitted on 8/21 from the [facility] ED with septic shock from HCAP.  The patient is mechanically ventilated and encephalopathic so history was obtained from chart review and discussion with ED staff.  The [facility] chart notes that on 8/21 she was found to have multi-lobar pneumonia, leukocytosis, hyperkalemia and hyperglycemia.  In the ED she was in marked respiratory distress on the vent.    PAST MEDICAL HISTORY :  Past Medical History  Diagnosis Date  . CAD (coronary artery disease)     a. Nonobst by cath 11/11: LAD 40%, mid-dist 25-30%; prox CFX 30%, mid 40%; OM 50%; PDA 50%; PLV 50%;  EF 65%.  . NSTEMI (non-ST elevated myocardial infarction)     a. In setting of AFib with RVR 03/2010, type 2. b. Again in 11/2011 felt 2/2 afib.  . Atrial fibrillation     a. DCCV 11/17/11. b. On coumadin & amiodarone.  Marland Kitchen HOCM (hypertrophic obstructive cardiomyopathy)     a. Echo 11/2011:severe LVH, EF  65-70%, mid-cavity gradient up to . b. Echo 06/2012: EF 65-70%, severe LVH, grade 2 d/dysf.  Marland Kitchen Hypertension   . Tobacco abuse   . CKD (chronic kidney disease), stage IV   . Diastolic CHF     preserved LVF  . Iron deficiency anemia   . Third degree uterine prolapse   . Hx MRSA infection   . Hx of cardiovascular stress test     a. Lex MV 12/13:  EF 56%, no ischemia    . Diabetes   . Hyperlipidemia   . Respiratory failure     Multiple admissions for resp failure requiring intubation.  . Hypercalcemia   . LBBB (left bundle branch block)     History of transient LBBB  . C. difficile colitis 01/2012   Past Surgical History  Procedure Laterality Date  . Tubal ligation    . Cardioversion  11/17/2011    Procedure: CARDIOVERSION;  Surgeon: Hillis Range, MD;  Location: [facility] OR;  Service: Cardiovascular;  Laterality: N/A;   Prior to Admission medications   Medication Sig Start Date End Date Taking? Authorizing Provider  acetaminophen (TYLENOL) 160 MG/5ML solution Place 20.3 mL (650 mg total) into feeding tube every 6 (six) hours as needed for fever. 12/22/12   Jeanella Craze, NP  antiseptic oral rinse (BIOTENE) LIQD 15 mL by Mouth Rinse route QID. 12/22/12   Jeanella Craze, NP  chlorhexidine (PERIDEX)  0.12 % solution Use as directed 15 mL in the mouth or throat 2 (two) times daily. 12/22/12   Jeanella Craze, NP  famotidine (PEPCID) 40 MG/5ML suspension Place 2.5 mL (20 mg total) into feeding tube daily. 12/22/12   Jeanella Craze, NP  feeding supplement (PRO-STAT SUGAR FREE 64) LIQD Place 30 mL into feeding tube 3 (three) times daily. 12/22/12   Jeanella Craze, NP  furosemide (LASIX) 40 MG tablet Take 1 tablet (40 mg total) by mouth daily. 12/22/12   Jeanella Craze, NP  insulin aspart (NOVOLOG) 100 UNIT/ML injection Inject 0-20 Units into the skin every 4 (four) hours. 12/22/12   Jeanella Craze, NP  insulin glargine (LANTUS) 100 UNIT/ML injection Inject 0.05 mL (5 Units total) into the skin daily.  12/22/12   Jeanella Craze, NP  LORazepam (ATIVAN) 1 MG tablet Take 1 tablet (1 mg total) by mouth every 6 (six) hours as needed for anxiety. 12/22/12   Jeanella Craze, NP  metoprolol tartrate (LOPRESSOR) 25 mg/10 mL SUSP Place 15 mL (37.5 mg total) into feeding tube every 6 (six) hours. 12/22/12   Jeanella Craze, NP  Nutritional Supplements (FEEDING SUPPLEMENT, OSMOLITE 1.2 CAL,) LIQD Place 1,000 mL into feeding tube continuous. Per tube at 45 ml /hr 12/22/12   Jeanella Craze, NP  potassium chloride 20 MEQ/15ML (10%) solution Take 15 mL (20 mEq total) by mouth daily. 12/22/12   Jeanella Craze, NP  predniSONE (STERAPRED UNI-PAK) 10 MG tablet Take 3 tablets (30 mg total) by mouth daily. 3 tabs daily for one week, then 2 tabs daily for one week, then decrease to 1 tab daily for on week then discontinue. 12/22/12   Jeanella Craze, NP  warfarin (COUMADIN) 5 MG tablet HOLD coumadin 8/15 and repeat INR in am 8/16.  GOAL INR 2-3 12/22/12   Jeanella Craze, NP   No Known Allergies  FAMILY HISTORY/SH/REVIEW OF SYSTEMS:  Cannot obtain due to encephalopathy  SUBJECTIVE:   VITAL SIGNS: Temp:  [104.5 F (40.3 C)] 104.5 F (40.3 C) (08/21 2034) Pulse Rate:  [55-125] 55 (08/21 2100) Resp:  [22-33] 22 (08/21 2100) BP: (86-92)/(54-67) 86/67 mmHg (08/21 2100) SpO2:  [99 %] 99 % (08/21 2034) HEMODYNAMICS:   VENTILATOR SETTINGS:   INTAKE / OUTPUT: Intake/Output   None     PHYSICAL EXAMINATION:  Gen: acutely ill appearing HEENT: NCAT, PERRL, OP clear, trach site c/d/i PULM: Insp crackles R, marked dysynchrony, poor air movement, no wheezing CV: Tachy, regular, no mgr, no JVD AB: BS+, soft, nontender, no hsm Ext: warm, trace pretibial edema, no clubbing, no cyanosis Derm: per nursing large stage 2 ulcer buttocks; PICC site R arm without redness, drainage, warmth GU: prolapsed uterus Neuro: unresponsive to external stimuli, GCS 3 on vent before sedation   LABS:  CBC No results found for this basename:  WBC, HGB, HCT, PLT,  in the last 72 hours Coag's No results found for this basename: APTT, INR,  in the last 72 hours BMET No results found for this basename: NA, K, CL, CO2, BUN, CREATININE, GLUCOSE,  in the last 72 hours Electrolytes No results found for this basename: CALCIUM, MG, PHOS,  in the last 72 hours Sepsis Markers No results found for this basename: LACTICACIDVEN, PROCALCITON, O2SATVEN,  in the last 72 hours ABG No results found for this basename: PHART, PCO2ART, PO2ART,  in the last 72 hours Liver Enzymes No results found for this basename: AST, ALT, ALKPHOS, BILITOT,  ALBUMIN,  in the last 72 hours Cardiac Enzymes No results found for this basename: TROPONINI, PROBNP,  in the last 72 hours Glucose Recent Labs     12/19/2012  2036  GLUCAP  479*    CXR: RUL and RLL pneumonia worse than prior, trach, picc line in place EKG: Sinus tach with aberrancy (LBBB morphology), some Afib on tele noted  ASSESSMENT / PLAN:  72 y/o female with chronic vent dependence who now has HCAP, possible DKA, and severe sepsis.  I suspect she is going to develop septic shock.  She is critically ill and has a very high expected mortality from this illness.  PULMONARY A:  Acute hypoxemic respiratory failure with profound hypoxemia from HCAP; not ARDS by CXR, heterogenous lung (emphysema on prior CT chest) COPD> not clearly in exacerbation Profound vent dysynchrony> needs sedation P:   -see ID -fentanyl gtt now -full vent support, increase PEEP given large A-a gradient -daily CXR/ABG -scheduled bronchodilators -may need paralytic x1, follow response to sedation  CARDIOVASCULAR A:  Severe sepsis > soft BP, not in shock yet CHF (diastolic dysfunction) not decompensated HOCM Afib with RVR > from sepsis P:  -hold home lasix/b-blocker for now -tele -consider amio, but treat sepsis aggressively for now -sepsis protocol -hold CVP monitoring, use PICC -a-line  RENAL A:  CKD, at  baseline Hyperkalemia from metabolic acidosis > received Ca, D50, insulin, albuterol in ED Metabolic acidosis from possible DKA, also likely lactic acidosis Hypercalcemia > of malignancy? S/p pamidronate at [facility] 8/16 P:   -check lactate -insulin gtt -repeat BMET -monitor UOP -hypercalcemia work up with hemodynamics/sepsis improved  GASTROINTESTINAL A:  Pancreatic head mass PEG P:   -if returns to improved functional status will need biopsy -enteral tube feeds  HEMATOLOGIC A:  Anemia, not actively bleeding P:  -DIC panel -monitor for bleeding -Hgb transfusion threshold is Hgb <7 gm/dL  INFECTIOUS A:  Septic shock clearly from HCAP; ddx includes candidemia (high risk) vs line infection, but at this point all signs point to HCAP High risk ESBL Decub ulcer, needs wound care P:   -vanc/imipenem/cipro -f/u blood, urine, resp cultures -wound care consult  ENDOCRINE A:  Hyperglycemia, poss DKA P:   -check serum ketone -insulin gtt  NEUROLOGIC A:  Acute encephalopathy due to sepsis P:   -sedation for vent synchrony -start with fentanyl gtt and prn versed -may need paralytic x1  Family: Left message for daughter via phone [name]), will continue to attempt to contact family.  TODAY'S SUMMARY:   I have personally obtained a history, examined the patient, evaluated laboratory and imaging results, formulated the assessment and plan and placed orders. CRITICAL CARE: The patient is critically ill with multiple organ systems failure and requires high complexity decision making for assessment and support, frequent evaluation and titration of therapies, application of advanced monitoring technologies and extensive interpretation of multiple databases. Critical Care Time devoted to patient care services described in this note is 60 minutes.   Fonnie Jarvis Pulmonary and Critical Care Medicine [facility] Pager: [phone]  12/19/2012, 9:22 PM

## 2012-12-28 NOTE — Progress Notes (Signed)
[facility] PCCM  Since note, she has become more hypotensive even on max dose phenylephrine and vasopressin.  A-line going in now, giving bicarb, continued fluid bolus.  I was able to contact Virgie Dad (daughter) to let her know that her mother's condition was worsening.  She and her sisters are going to come in tonight.  Additional CC time 40 minutes.  Yolonda Kida PCCM Pager: [phone] Cell: [phone] If no response, call [phone]

## 2012-12-28 NOTE — ED Provider Notes (Signed)
CSN: [id number]     Arrival date & time 01-21-2013  2028 History     First MD Initiated Contact with Patient 2013-01-21 2036     Chief Complaint  Patient presents with  . Respiratory Distress   (Consider location/radiation/quality/duration/timing/severity/associated sxs/prior Treatment) HPI Comments: [facility] for [facility]. Today patient found to be more lethargic and have more difficulty breathing on the ventilator. Labs there showed leukocytosis, renal failure, hyperkalemia. Chest x-ray showed multilobar pneumonia. She was given vancomycin by the nursing home there.  Patient is a 72 y.o. female presenting with shortness of breath. The history is provided by the EMS personnel. The history is limited by the condition of the patient.  Shortness of Breath Severity:  Severe Onset quality:  Gradual Timing:  Constant Progression:  Worsening Chronicity:  Recurrent Context comment:  Trach in place, on ventilator Relieved by:  Nothing Worsened by:  Nothing tried Ineffective treatments:  None tried Associated symptoms: fever   Associated symptoms: no abdominal pain and no chest pain     Past Medical History  Diagnosis Date  . CAD (coronary artery disease)     a. Nonobst by cath 11/11: LAD 40%, mid-dist 25-30%; prox CFX 30%, mid 40%; OM 50%; PDA 50%; PLV 50%;  EF 65%.  . NSTEMI (non-ST elevated myocardial infarction)     a. In setting of AFib with RVR 03/2010, type 2. b. Again in 11/2011 felt 2/2 afib.  . Atrial fibrillation     a. DCCV 11/17/11. b. On coumadin & amiodarone.  Marland Kitchen HOCM (hypertrophic obstructive cardiomyopathy)     a. Echo 11/2011:severe LVH, EF 65-70%, mid-cavity gradient up to . b. Echo 06/2012: EF 65-70%, severe LVH, grade 2 d/dysf.  Marland Kitchen Hypertension   . Tobacco abuse   . CKD (chronic kidney disease), stage IV   . Diastolic CHF     preserved LVF  . Iron deficiency anemia   . Third degree uterine prolapse   . Hx MRSA infection   . Hx  of cardiovascular stress test     a. Lex MV 12/13:  EF 56%, no ischemia    . Diabetes   . Hyperlipidemia   . Respiratory failure     Multiple admissions for resp failure requiring intubation.  . Hypercalcemia   . LBBB (left bundle branch block)     History of transient LBBB  . C. difficile colitis 01/2012   Past Surgical History  Procedure Laterality Date  . Tubal ligation    . Cardioversion  11/17/2011    Procedure: CARDIOVERSION;  Surgeon: Hillis Range, MD;  Location: [facility] OR;  Service: Cardiovascular;  Laterality: N/A;   Family History  Problem Relation Age of Onset  . Coronary artery disease Neg Hx   . Atrial fibrillation Neg Hx   . Diabetes Daughter    History  Substance Use Topics  . Smoking status: Former Smoker -- 0.25 packs/day for 30 years    Types: Cigarettes    Quit date: 02/02/2012  . Smokeless tobacco: Never Used     Comment: quit 01/2012  . Alcohol Use: No   OB History   Grav Para Term Preterm Abortions TAB SAB Ect Mult Living                 Review of Systems  Unable to perform ROS: Intubated  Constitutional: Positive for fever.  Respiratory: Positive for shortness of breath.   Cardiovascular: Negative for chest pain.  Gastrointestinal: Negative for abdominal pain.  Allergies  Review of patient's allergies indicates no known allergies.  Home Medications   Current Outpatient Rx  Name  Route  Sig  Dispense  Refill  . acetaminophen (TYLENOL) 160 MG/5ML solution   Per Tube   Place 20.3 mL (650 mg total) into feeding tube every 6 (six) hours as needed for fever.   120 mL   0   . antiseptic oral rinse (BIOTENE) LIQD   Mouth Rinse   15 mL by Mouth Rinse route QID.         Marland Kitchen chlorhexidine (PERIDEX) 0.12 % solution   Mouth/Throat   Use as directed 15 mL in the mouth or throat 2 (two) times daily.   120 mL   0   . famotidine (PEPCID) 40 MG/5ML suspension   Per Tube   Place 2.5 mL (20 mg total) into feeding tube daily.   50 mL   0   .  feeding supplement (PRO-STAT SUGAR FREE 64) LIQD   Per Tube   Place 30 mL into feeding tube 3 (three) times daily.   900 mL   0   . furosemide (LASIX) 40 MG tablet   Oral   Take 1 tablet (40 mg total) by mouth daily.   30 tablet      . insulin aspart (NOVOLOG) 100 UNIT/ML injection   Subcutaneous   Inject 0-20 Units into the skin every 4 (four) hours.   1 vial   12   . insulin glargine (LANTUS) 100 UNIT/ML injection   Subcutaneous   Inject 0.05 mL (5 Units total) into the skin daily.   10 mL   12   . LORazepam (ATIVAN) 1 MG tablet   Oral   Take 1 tablet (1 mg total) by mouth every 6 (six) hours as needed for anxiety.   30 tablet   0   . metoprolol tartrate (LOPRESSOR) 25 mg/10 mL SUSP   Per Tube   Place 15 mL (37.5 mg total) into feeding tube every 6 (six) hours.         . Nutritional Supplements (FEEDING SUPPLEMENT, OSMOLITE 1.2 CAL,) LIQD   Per Tube   Place 1,000 mL into feeding tube continuous. Per tube at 45 ml /hr      0   . potassium chloride 20 MEQ/15ML (10%) solution   Oral   Take 15 mL (20 mEq total) by mouth daily.   500 mL   0   . predniSONE (STERAPRED UNI-PAK) 10 MG tablet   Oral   Take 3 tablets (30 mg total) by mouth daily. 3 tabs daily for one week, then 2 tabs daily for one week, then decrease to 1 tab daily for on week then discontinue.         . warfarin (COUMADIN) 5 MG tablet      HOLD coumadin 8/15 and repeat INR in am 8/16.  GOAL INR 2-3          BP 92/54  Pulse 125  Resp 33  SpO2 % Physical Exam  Nursing note and vitals reviewed. Constitutional: She appears well-developed and well-nourished. She appears distressed.  HENT:  Head: Normocephalic and atraumatic.  Eyes: EOM are normal. Pupils are equal, round, and reactive to light.  Neck: Normal range of motion. Neck supple.  Trach collar in place  Cardiovascular: Normal rate and regular rhythm.  Exam reveals no friction rub.   No murmur heard. Pulmonary/Chest: She is in  respiratory distress (Tachypnea, severely labored respirations). She has rales.  Abdominal: Soft. She exhibits no distension. There is no tenderness. There is no rebound.  Genitourinary:  Prolapsed uterus. On underside, pressure ulcers forming. Large area of skin breakdown over sacrum.  Musculoskeletal: Normal range of motion. She exhibits no edema.  Neurological:  Nonresponsive  Skin: She is not diaphoretic.    ED Course   Procedures (including critical care time)  Labs Reviewed  CBC - Abnormal; Notable for the following:    WBC 11.3 (*)    RBC 3.21 (*)    Hemoglobin 9.1 (*)    HCT 29.5 (*)    RDW 20.0 (*)    All other components within normal limits  COMPREHENSIVE METABOLIC PANEL - Abnormal; Notable for the following:    Potassium 6.2 (*)    CO2 18 (*)    Glucose, Bld 535 (*)    BUN 87 (*)    Creatinine, Ser 2.75 (*)    Albumin 2.0 (*)    AST 39 (*)    ALT 70 (*)    Alkaline Phosphatase 134 (*)    GFR calc non Af Amer 16 (*)    GFR calc Af Amer 19 (*)    All other components within normal limits  LACTIC ACID, PLASMA - Abnormal; Notable for the following:    Lactic Acid, Venous 4.0 (*)    All other components within normal limits  URINALYSIS, ROUTINE W REFLEX MICROSCOPIC - Abnormal; Notable for the following:    APPearance TURBID (*)    Glucose, UA >1000 (*)    Hgb urine dipstick LARGE (*)    Protein, ur 100 (*)    Leukocytes, UA MODERATE (*)    All other components within normal limits  TROPONIN I - Abnormal; Notable for the following:    Troponin I 0.36 (*)    All other components within normal limits  PROTIME-INR - Abnormal; Notable for the following:    Prothrombin Time 26.1 (*)    INR 2.49 (*)    All other components within normal limits  FIBRINOGEN - Abnormal; Notable for the following:    Fibrinogen 670 (*)    All other components within normal limits  GLUCOSE, CAPILLARY - Abnormal; Notable for the following:    Glucose-Capillary 479 (*)    All other  components within normal limits  URINE MICROSCOPIC-ADD ON - Abnormal; Notable for the following:    Squamous Epithelial / LPF FEW (*)    Bacteria, UA MANY (*)    All other components within normal limits  POCT I-STAT 3, BLOOD GAS (G3+) - Abnormal; Notable for the following:    pH, Arterial 7.135 (*)    pCO2 arterial 47.3 (*)    pO2, Arterial 111.0 (*)    Bicarbonate 15.5 (*)    Acid-base deficit 12.0 (*)    All other components within normal limits  CULTURE, BLOOD (ROUTINE X 2)  CULTURE, BLOOD (ROUTINE X 2)  URINE CULTURE  CULTURE, EXPECTORATED SPUTUM-ASSESSMENT  CULTURE, RESPIRATORY (NON-EXPECTORATED)  APTT  CORTISOL  URINALYSIS, ROUTINE W REFLEX MICROSCOPIC  BASIC METABOLIC PANEL  KETONES, QUALITATIVE  CBC  BASIC METABOLIC PANEL  BLOOD GAS, ARTERIAL  TYPE AND SCREEN   Dg Chest Portable 1 View  01/01/2013   *RADIOLOGY REPORT*  Clinical Data: Sepsis, pneumonia  PORTABLE CHEST - 1 VIEW  Comparison: December 22, 2012.  Findings: Tracheostomy is in grossly good position.  Left lung is clear.  Worsening alveolar opacity is seen in the right upper and lower lobes consistent with pneumonia.  Right-sided PICC  line is unchanged in position.  No pneumothorax is noted.  Minimal right pleural effusion is noted.  IMPRESSION: Worsening right upper and lower lobe pneumonia.   Original Report Authenticated By: Lupita Raider.,  M.D.   1. Septic shock(785.52)   2. Acute respiratory failure with hypoxia   3. Atrial fibrillation   4. CHF (congestive heart failure)   5. Chronic kidney disease, stage V   6. COPD (chronic obstructive pulmonary disease)   7. Diabetes mellitus, type 2   8. HCAP (healthcare-associated pneumonia)   9. Hyperkalemia   10. Hypertrophic obstructive cardiomyopathy(425.11)     CRITICAL CARE Performed by: Dagmar Hait   Total critical care time: 30 minutes  Critical care time was exclusive of separately billable procedures and treating other  patients.  Critical care was necessary to treat or prevent imminent or life-threatening deterioration.  Critical care was time spent personally by me on the following activities: development of treatment plan with patient and/or surrogate as well as nursing, discussions with consultants, evaluation of patient's response to treatment, examination of patient, obtaining history from patient or surrogate, ordering and performing treatments and interventions, ordering and review of laboratory studies, ordering and review of radiographic studies, pulse oximetry and re-evaluation of patient's condition.  MDM  A 72 year old female presents with nursing multilobar pneumonia. Patient in severe sepsis arrival. Labs from outside show hyperkalemia and renal failure. On arrival, patient tachycardic with peak T waves on EKG. Patient found to be febrile at 104.5 rectally. She is a chronically prolapsed uterus with ulcers forming on underside.  For her pneumonia, sepsis called. 2 L normal saline given. Cultures drawn from her PICC line, peripherally. Urine culture obtained. Tracheal aspirate culture obtained. Tylenol given for fever. For her hyperkalemia, calcium chloride given to her PICC line, insulin and D50 given, albuterol given. Antibiotics started including vancomycin, cefepime, azithromycin. Critical care consulted and admitting.  Dagmar Hait, MD 12/22/[id number]

## 2012-12-28 NOTE — Progress Notes (Addendum)
ANTIBIOTIC CONSULT NOTE - INITIAL  Pharmacy Consult for Meropenem Indication: pneumonia (multiple recent antibiotics)  No Known Allergies  Microbiology: Recent Results (from the past 720 hour(s))  CLOSTRIDIUM DIFFICILE BY PCR     Status: None   Collection Time    12/02/12  8:53 PM      Result Value Range Status   C difficile by pcr NEGATIVE  NEGATIVE Final    Medical History: Past Medical History  Diagnosis Date  . CAD (coronary artery disease)     a. Nonobst by cath 11/11: LAD 40%, mid-dist 25-30%; prox CFX 30%, mid 40%; OM 50%; PDA 50%; PLV 50%;  EF 65%.  . NSTEMI (non-ST elevated myocardial infarction)     a. In setting of AFib with RVR 03/2010, type 2. b. Again in 11/2011 felt 2/2 afib.  . Atrial fibrillation     a. DCCV 11/17/11. b. On coumadin & amiodarone.  Marland Kitchen HOCM (hypertrophic obstructive cardiomyopathy)     a. Echo 11/2011:severe LVH, EF 65-70%, mid-cavity gradient up to . b. Echo 06/2012: EF 65-70%, severe LVH, grade 2 d/dysf.  Marland Kitchen Hypertension   . Tobacco abuse   . CKD (chronic kidney disease), stage IV   . Diastolic CHF     preserved LVF  . Iron deficiency anemia   . Third degree uterine prolapse   . Hx MRSA infection   . Hx of cardiovascular stress test     a. Lex MV 12/13:  EF 56%, no ischemia    . Diabetes   . Hyperlipidemia   . Respiratory failure     Multiple admissions for resp failure requiring intubation.  . Hypercalcemia   . LBBB (left bundle branch block)     History of transient LBBB  . C. difficile colitis 01/2012    Assessment: 72 year old female admitted from [facility] [place] with possible sepsis.  With renal insufficiency.  Received vancomycin and cefepime in the ED.  Now changing antibiotics to meropenem and cipro  Goal of Therapy:  Appropriate dosing  Plan:  1) Cipro 400 mg iv Q 24 hours 2) Meropenem 500 mg iv Q 12 hours  Thank you. Okey Regal, PharmD  12/28/2012,8:58 PM

## 2012-12-29 ENCOUNTER — Encounter (HOSPITAL_COMMUNITY): Payer: Self-pay

## 2012-12-29 ENCOUNTER — Inpatient Hospital Stay (HOSPITAL_COMMUNITY): Payer: Medicare Other

## 2012-12-29 DIAGNOSIS — I509 Heart failure, unspecified: Secondary | ICD-10-CM

## 2012-12-29 DIAGNOSIS — A419 Sepsis, unspecified organism: Secondary | ICD-10-CM

## 2012-12-29 DIAGNOSIS — E872 Acidosis: Secondary | ICD-10-CM | POA: Diagnosis present

## 2012-12-29 DIAGNOSIS — J189 Pneumonia, unspecified organism: Secondary | ICD-10-CM

## 2012-12-29 DIAGNOSIS — N185 Chronic kidney disease, stage 5: Secondary | ICD-10-CM

## 2012-12-29 DIAGNOSIS — I5033 Acute on chronic diastolic (congestive) heart failure: Principal | ICD-10-CM

## 2012-12-29 DIAGNOSIS — J9620 Acute and chronic respiratory failure, unspecified whether with hypoxia or hypercapnia: Secondary | ICD-10-CM | POA: Diagnosis present

## 2012-12-29 LAB — CBC
HCT: 31.7 % — ABNORMAL LOW (ref 36.0–46.0)
HCT: 26.8 % — ABNORMAL LOW (ref 36.0–46.0)
Hemoglobin: 8.2 g/dL — ABNORMAL LOW (ref 12.0–15.0)
MCH: 27.8 pg (ref 26.0–34.0)
MCH: 27.9 pg (ref 26.0–34.0)
MCHC: 30.6 g/dL (ref 30.0–36.0)
MCV: 91.9 fL (ref 78.0–100.0)
Platelets: 400 10*3/uL (ref 150–400)
RBC: 3.45 MIL/uL — ABNORMAL LOW (ref 3.87–5.11)

## 2012-12-29 LAB — BLOOD GAS, ARTERIAL
Bicarbonate: 20 mEq/L (ref 20.0–24.0)
Drawn by: 24513
MECHVT: 500 mL
MECHVT: 500 mL
O2 Saturation: 85.1 %
PEEP: 12 cmH2O
PEEP: 12 cmH2O
Patient temperature: 98.6
Patient temperature: 98.6
RATE: 26 resp/min
TCO2: 26.3 mmol/L (ref 0–100)
pCO2 arterial: 59.1 mmHg (ref 35.0–45.0)
pH, Arterial: 7.241 — ABNORMAL LOW (ref 7.350–7.450)

## 2012-12-29 LAB — BASIC METABOLIC PANEL
BUN: 72 mg/dL — ABNORMAL HIGH (ref 6–23)
CO2: 17 mEq/L — ABNORMAL LOW (ref 19–32)
Calcium: 8.6 mg/dL (ref 8.4–10.5)
Chloride: 112 mEq/L (ref 96–112)
GFR calc Af Amer: 22 mL/min — ABNORMAL LOW (ref >90)
GFR calc non Af Amer: 19 mL/min — ABNORMAL LOW (ref >90)
GFR calc non Af Amer: 20 mL/min — ABNORMAL LOW (ref >90)
Glucose, Bld: 156 mg/dL — ABNORMAL HIGH (ref 70–99)
Potassium: 3.4 mEq/L — ABNORMAL LOW (ref 3.5–5.1)

## 2012-12-29 LAB — KETONES, QUALITATIVE: Acetone, Bld: NEGATIVE

## 2012-12-29 LAB — GLUCOSE, CAPILLARY
Glucose-Capillary: 397 mg/dL — ABNORMAL HIGH (ref 70–99)
Glucose-Capillary: 260 mg/dL — ABNORMAL HIGH (ref 70–99)
Glucose-Capillary: 261 mg/dL — ABNORMAL HIGH (ref 70–99)
Glucose-Capillary: 205 mg/dL — ABNORMAL HIGH (ref 70–99)
Glucose-Capillary: 107 mg/dL — ABNORMAL HIGH (ref 70–99)

## 2012-12-29 MED ORDER — SODIUM CHLORIDE 0.9 % IV SOLN
1.0000 mg/h | INTRAVENOUS | Status: DC
  Filled 2012-12-29: qty 10

## 2012-12-29 MED ORDER — CHLORHEXIDINE GLUCONATE 0.12 % MT SOLN
15.0000 mL | Freq: Two times a day (BID) | OROMUCOSAL | Status: DC
  Administered 2012-12-29: 15 mL via OROMUCOSAL
  Filled 2012-12-29: qty 15

## 2012-12-29 MED ORDER — SODIUM CHLORIDE 0.9 % IV SOLN
25.0000 ug/h | INTRAVENOUS | Status: DC
  Filled 2012-12-29: qty 50

## 2012-12-29 MED ORDER — MIDAZOLAM BOLUS VIA INFUSION
5.0000 mg | INTRAVENOUS | Status: DC | PRN
  Filled 2012-12-29: qty 20

## 2012-12-29 MED ORDER — FENTANYL BOLUS VIA INFUSION
25.0000 ug | Freq: Four times a day (QID) | INTRAVENOUS | Status: DC | PRN
  Filled 2012-12-29: qty 100

## 2012-12-29 MED ORDER — INSULIN ASPART 100 UNIT/ML ~~LOC~~ SOLN: 0.0000 [IU] | SUBCUTANEOUS | Status: DC

## 2012-12-29 MED ORDER — SODIUM CHLORIDE 0.9 % IV SOLN
10.0000 mg/h | INTRAVENOUS | Status: DC
  Administered 2012-12-29: 4 mg/h via INTRAVENOUS
  Administered 2012-12-29: 6 mg/h via INTRAVENOUS
  Filled 2012-12-29: qty 10

## 2012-12-29 MED ORDER — SODIUM BICARBONATE 8.4 % IV SOLN
100.0000 meq | Freq: Once | INTRAVENOUS | Status: AC
  Administered 2012-12-29: 100 meq via INTRAVENOUS
  Filled 2012-12-29: qty 100

## 2012-12-29 MED ORDER — DEXTROSE 50 % IV SOLN
INTRAVENOUS | Status: AC
  Administered 2012-12-29: 50 mL via INTRAVENOUS
  Filled 2012-12-29: qty 50

## 2012-12-29 MED ORDER — OSMOLITE 1.2 CAL PO LIQD
1000.0000 mL | ORAL | Status: DC
  Filled 2012-12-29 (×2): qty 1000

## 2012-12-29 MED ORDER — DEXTROSE 50 % IV SOLN: 50.0000 mL | Freq: Once | INTRAVENOUS | Status: AC | PRN

## 2012-12-29 MED ORDER — SODIUM BICARBONATE 8.4 % IV SOLN
INTRAVENOUS | Status: AC
  Filled 2012-12-29: qty 100

## 2012-12-29 MED ORDER — FENTANYL BOLUS VIA INFUSION
50.0000 ug | INTRAVENOUS | Status: DC | PRN
  Filled 2012-12-29: qty 200

## 2012-12-29 MED ORDER — BIOTENE DRY MOUTH MT LIQD
15.0000 mL | Freq: Two times a day (BID) | OROMUCOSAL | Status: DC
  Administered 2012-12-29: 15 mL via OROMUCOSAL

## 2012-12-29 MED ORDER — STERILE WATER FOR INJECTION IV SOLN
INTRAVENOUS | Status: DC
  Administered 2012-12-29 (×2): via INTRAVENOUS
  Filled 2012-12-29 (×6): qty 850

## 2012-12-29 MED ORDER — MIDAZOLAM BOLUS VIA INFUSION
1.0000 mg | INTRAVENOUS | Status: DC | PRN
  Filled 2012-12-29: qty 2

## 2012-12-31 LAB — CULTURE, RESPIRATORY W GRAM STAIN

## 2012-12-31 LAB — URINE CULTURE: Colony Count: >=100000

## 2013-01-01 LAB — GLUCOSE, CAPILLARY
Glucose-Capillary: 126 mg/dL — ABNORMAL HIGH (ref 70–99)
Glucose-Capillary: 101 mg/dL — ABNORMAL HIGH (ref 70–99)
Glucose-Capillary: 75 mg/dL (ref 70–99)
Glucose-Capillary: 58 mg/dL — ABNORMAL LOW (ref 70–99)

## 2013-01-04 LAB — CULTURE, BLOOD (ROUTINE X 2)

## 2013-01-05 LAB — CULTURE, BLOOD (ROUTINE X 2)

## 2013-01-08 NOTE — Progress Notes (Signed)
Palliative Medicine Team consult requested by Dr Sung Amabile; no family at bedside pt unresponsive, trached on vent;spoke with Dr Sung Amabile who informs that after discussion with patient's family their goals are set for full comfort and decision was made to withdraw from vent and pressor support once all family members are at the hospital this afternoon; spoke briefly with daughter [employer] outside of room she stated they have a large family 5 girls and 3 boys and that they have just recently lost their dad who was under hospice care; emotional support offered she voiced appreciation of support and stated chaplain has been to pray with family in the waiting area; she is agreeable to PMT SW visiting with family for additional support - PMT will continue to follow   Valente David, RN 12/10/2012, 3:11 PM Palliative Medicine Team RN Liaison [phone]

## 2013-01-08 NOTE — Progress Notes (Signed)
INITIAL NUTRITION ASSESSMENT  DOCUMENTATION CODES Per approved criteria  -Not Applicable   INTERVENTION: Initiate Osmolite 1.2 @ 20 ml/hr via feeding tube and increase by 10 ml every 4 hours to goal rate of 65 ml/hr.  At goal rate, tube feeding regimen will provide 1896 kcal, 87 grams of protein, and 1279 ml of H2O.   Free water flushes: 150 ml every 6 hrs providing 1000 of free water.  Total free water: 1879 ml per day.  Last bm: none recorded  NUTRITION DIAGNOSIS: Inadequate oral intake related to inability to eat as evidenced by NPO status.   Goal: Pt to meet >/= 90% of their estimated nutrition needs   Monitor:  Weight, initiation of tube feeding, labs, I/O's, respiratory status  Reason for Assessment: Consult  72 y.o. female  Admitting Dx: <principal problem not specified>  ASSESSMENT: Pt with hx of CHF admitted with septic shock. Pt was recently discharged from [facility] for [facility] in the setting of CHF exacerbation and HCAP.  PTA, pt was tolerating tube feeding well and was on Osmolite 1.2 @ 45 ml/hr with Prostat TID.  Pt is currently in positive fluid balance. All calculations based on dry weight of 162 lbs.  Pt is currently on ventilator support. Minute Ventilation: 19.7 L/min T Max: 38 degrees Celsius  Height: Ht Readings from Last 1 Encounters:  12/24/2012 5\' 2"  (1.575 m)    Weight: Wt Readings from Last 1 Encounters:  2013/01/07 173 lb 4.5 oz (78.6 kg)    Ideal Body Weight: 50.1 kg  % Ideal Body Weight: 147%  Wt Readings from Last 10 Encounters:  January 07, 2013 173 lb 4.5 oz (78.6 kg)  12/22/12 162 lb 7.7 oz (73.7 kg)  09/04/12 164 lb 3.9 oz (74.5 kg)  08/31/12 178 lb 1.9 oz (80.795 kg)  06/28/12 180 lb (81.647 kg)  06/22/12 177 lb 4 oz (80.4 kg)  06/05/12 176 lb 14.4 oz (80.241 kg)  04/27/12 184 lb 1.9 oz (83.516 kg)  04/19/12 180 lb (81.647 kg)  04/09/12 177 lb 0.5 oz (80.3 kg)    Usual Body Weight: unknown  % Usual Body Weight: unknown  BMI:  Body  mass index is 31.69 kg/(m^2).  Estimated Nutritional Needs: Kcal: 1896 Protein: 90-100 g Fluid: Per MD Estimations are calculated based on dry weight of 162 lbs d/t patient being in positive fluid balance.   Skin: Severe denuded areas to entire buttocks, perineum, near rectum, and prolapsed uterus.  R/t to constant stooling; moisture associated skin damage.   Diet Order:    EDUCATION NEEDS: -Education not appropriate at this time   Intake/Output Summary (Last 24 hours) at 2013/01/07 0947 Last data filed at 01/07/13 0900  Gross per 24 hour  Intake 6156.61 ml  Output    430 ml  Net 5726.61 ml    Last BM: constant stooling   Labs:   Recent Labs Lab 12/12/2012 2030 01/07/2013 2345 01-07-2013 0530  NA 139 144 149*  K 6.2* 4.0 3.4*  CL 107 117* 112  CO2 18* 17* 26  BUN 87* 75* 72*  CREATININE 2.75* 2.37* 2.30*  CALCIUM 9.4 8.6 8.9  GLUCOSE 535* 315* 156*    CBG (last 3)   Recent Labs  07-Jan-2013 0400 07-Jan-2013 0505 01/07/2013 0752  GLUCAP 205* 168* 103*    Scheduled Meds: . albuterol  8 puff Inhalation Q4H  . antiseptic oral rinse  15 mL Mouth Rinse q12n4p  . chlorhexidine  15 mL Mouth Rinse BID  . ciprofloxacin  400 mg  Intravenous Q24H  . meropenem (MERREM) IV  500 mg Intravenous Q12H  . pantoprazole (PROTONIX) IV  40 mg Intravenous QHS    Continuous Infusions: . fentaNYL infusion INTRAVENOUS 25 mcg/hr (12/13/2012 0921)  . insulin (NOVOLIN-R) infusion Stopped (01/05/2013 0911)  . midazolam (VERSED) infusion 1 mg/hr (12/10/2012 0922)  . phenylephrine (NEO-SYNEPHRINE) Adult infusion 300 mcg/min (01/06/2013 0942)  .  sodium bicarbonate 150 mEq in sterile water 1000 mL infusion 200 mL/hr at 12/08/2012 0759  . vasopressin (PITRESSIN) infusion - *FOR SHOCK* 0.03 Units/min (12/14/2012 0759)    Past Medical History  Diagnosis Date  . CAD (coronary artery disease)     a. Nonobst by cath 11/11: LAD 40%, mid-dist 25-30%; prox CFX 30%, mid 40%; OM 50%; PDA 50%; PLV 50%;  EF 65%.  .  NSTEMI (non-ST elevated myocardial infarction)     a. In setting of AFib with RVR 03/2010, type 2. b. Again in 11/2011 felt 2/2 afib.  . Atrial fibrillation     a. DCCV 11/17/11. b. On coumadin & amiodarone.  Marland Kitchen HOCM (hypertrophic obstructive cardiomyopathy)     a. Echo 11/2011:severe LVH, EF 65-70%, mid-cavity gradient up to . b. Echo 06/2012: EF 65-70%, severe LVH, grade 2 d/dysf.  Marland Kitchen Hypertension   . Tobacco abuse   . CKD (chronic kidney disease), stage IV   . Diastolic CHF     preserved LVF  . Iron deficiency anemia   . Third degree uterine prolapse   . Hx MRSA infection   . Hx of cardiovascular stress test     a. Lex MV 12/13:  EF 56%, no ischemia    . Diabetes   . Hyperlipidemia   . Respiratory failure     Multiple admissions for resp failure requiring intubation.  . Hypercalcemia   . LBBB (left bundle branch block)     History of transient LBBB  . C. difficile colitis 01/2012    Past Surgical History  Procedure Laterality Date  . Tubal ligation    . Cardioversion  11/17/2011    Procedure: CARDIOVERSION;  Surgeon: Hillis Range, MD;  Location: [facility] OR;  Service: Cardiovascular;  Laterality: N/A;    Ebbie Latus RD, LDN

## 2013-01-08 NOTE — Care Management Note (Signed)
    Page 1 of 1   January 14, 2013     7:13:25 AM   CARE MANAGEMENT NOTE 01/14/2013  Patient:  [name], [name]   Account Number:  [id number]  Date Initiated:  14-Jan-2013  Documentation initiated by:  Junius Creamer  Subjective/Objective Assessment:   adm w septic shoci     Action/Plan:   was at [facility], [facility] last hosp was living w da   Anticipated DC Date:     Anticipated DC Plan:    In-house referral  Clinical Social Worker      DC Associate Professor  CM consult      Choice offered to / List presented to:             Status of service:   Medicare Important Message given?   (If response is "NO", the following Medicare IM given date fields will be blank) Date Medicare IM given:   Date Additional Medicare IM given:    Discharge Disposition:    Per UR Regulation:  Reviewed for med. necessity/level of care/duration of stay  If discussed at Long Length of Stay Meetings, dates discussed:    Comments:

## 2013-01-08 NOTE — Progress Notes (Signed)
LB PCCM  I have discussed the patient's situation in detail with her family and explained to them that her prognosis is grim.  The patient's daughter Tiahna Cure told me that they do not want her mother to receive electric shocks or CPR, but to continue full medical support otherwise.  Yolonda Kida PCCM Pager: [phone] Cell: [phone] If no response, call [phone]

## 2013-01-08 NOTE — Progress Notes (Signed)
eLink Physician-Brief Progress Note Patient Name: [name] DOB: Jan 04, 1941 MRN: [id number]  Date of Service  12/24/2012   HPI/Events of Note  Continued severe metabolic and lactic acidosis with pH less than 7.2.  Remains poorly oxygenated.   eICU Interventions  Plan: Additional bicarb Increased Bicarb gtt Concern for the volume the patient is receiving given her poor oxgenation  Remains a limited code   Intervention Category Major Interventions: Acid-Base disturbance - evaluation and management  DETERDING,ELIZABETH 12/18/2012, 2:25 AM

## 2013-01-08 NOTE — Progress Notes (Signed)
Palliative Medicine Team SW Arrived to unit just as pt passed away. Provided prayer, presence and emotional support to family. Called to follow up on comfort cart request. Provided family with a bible, engaged them in reminiscence therapy and encouraged them to sing songs as they say their goodbyes. Provided information on bereavement and [facility] services and encouraged them to practice good self-care. Family very appreciative, available as needed.   Veronica Lett, LCSW

## 2013-01-08 NOTE — Progress Notes (Signed)
I have reviewed in detail this patient's recent prolonged hospitalization and her acute illness. Further, I note that she has been hospitalized numerous times in the past year. Despite max dose vasopressors, she remains hypotensive and oliguric. She appeared uncomfortable despite sedative infusions.  LINES / TUBES:  7/17 Trach (DF) >> downsized to #4 8/1 > replaced with # 6 cuffed 8/2 for resp distrss  7/17 Rt PICC >>  7/22 PEG >>  CULTURES:  8/21 resp culture >>  8/21 blood culture >>  8/21 urine culture >>  ANTIBIOTICS:  8/21 vanc >>  8/21 mero >>  8/21 cipro >>  8/21 cefepime x1  8/21 azithro x1  Filed Vitals:   12/19/2012 1300  BP: 55/14  Pulse:   Temp: 101.1 F (38.4 C)  Resp: 27   Gen: acutely ill appearing  HEENT: NCAT, PERRL, OP clear, trach site c/d/i  PULM: Insp crackles R, marked dysynchrony, poor air movement, no wheezing  CV: Tachy, regular, no mgr, no JVD  AB: BS+, soft, nontender, no hsm  Ext: warm, trace pretibial edema, no clubbing, no cyanosis  Derm: per nursing large stage 2 ulcer buttocks; PICC site R arm without redness, drainage, warmth  GU: prolapsed uterus  Neuro: unresponsive to external stimuli, GCS 3 on vent before sedation  BMET    Component Value Date/Time   NA 149* 12/21/2012 0530   K 3.4* 12/25/2012 0530   CL 112 12/30/2012 0530   CO2 26 12/11/2012 0530   GLUCOSE 156* 01/06/2013 0530   BUN 72* 12/13/2012 0530   CREATININE 2.30* 12/25/2012 0530   CREATININE 1.83* 03/02/2012 1130   CALCIUM 8.9 12/31/2012 0530   CALCIUM 11.3* 06/03/2012 0440   GFRNONAA 20* 12/28/2012 0530   GFRAA 23* 01/01/2013 0530    CBC    Component Value Date/Time   WBC 9.5 01/03/2013 0530   RBC 2.94* 12/23/2012 0530   HGB 8.2* 12/08/2012 0530   HCT 26.8* 12/14/2012 0530   PLT 249 12/27/2012 0530   MCV 91.2 12/14/2012 0530   MCH 27.9 01/05/2013 0530   MCHC 30.6 12/15/2012 0530   RDW 19.9* 12/14/2012 0530   LYMPHSABS 1.2 12/17/2012 0500   MONOABS 0.6 12/17/2012 0500   EOSABS  0.1 12/17/2012 0500   BASOSABS 0.0 12/17/2012 0500    Principal Problem:   HCAP (healthcare-associated pneumonia) Active Problems:   Tracheostomy status   Septic shock(785.52)   Metabolic acidosis   Acute and chronic respiratory failure   CHRONIC KIDNEY DISEASE STAGE V   CARDIOMYOPATHY, IDIOPATHIC HYPERTROPHIC   FIBRILLATION, ATRIAL   Lactic acidosis   Diabetes mellitus, type 2   COPD (chronic obstructive pulmonary disease)   Hyperglycemia   Hyperkalemia   It is clear that a favorable outcome will not be possible here. I talked to family in detail re: goals of care and suggested that full comfort care would be very appropriate @ this point. After further discussion among themselves this is what they have opted for. Once other family members arrive, we will proceed with full comfort and discontinuation of vasopressors and vent support  40 mins CCM time including 25 mins of family conference time   Billy Fischer, MD ; [facility] service Mobile [phone].  After 5:30 PM or weekends, call [phone]

## 2013-01-08 NOTE — Progress Notes (Signed)
Hypoglycemic Event  CBG: 58  Treatment: D50 IV 50 mL  Symptoms: None  Follow-up CBG: Time: 1030 CBG Result: 98  Possible Reasons for Event: Inadequate meal intake  Comments/MD notified:Simonds notified, insulin gtt turned off this am.     [name]  Remember to initiate Hypoglycemia Order Set & complete

## 2013-01-08 NOTE — Progress Notes (Signed)
Chaplain Note: Chaplain visited with the family of pt in the [facility] conference room.  Family had gathered to consider the goals of care for the pt as regards comfort care and possible extubation.  Chaplain provided spiritual comfort, support, and prayer for the pt's family as the struggled with these matters.  Pt's family expressed appreciation for chaplain support.  Chaplain will follow up as needed.  Jan 12, 2013 1100  Clinical Encounter Type  Visited With Family  Visit Type Spiritual support  Referral From Nurse  Spiritual Encounters  Spiritual Needs Prayer;Emotional  Stress Factors  Patient Stress Factors Health changes  Family Stress Factors Major life changes  Verdie Shire, Chaplain  [phone]

## 2013-01-08 NOTE — Consult Note (Addendum)
WOC consult Note Reason for Consult: Consult requested for buttocks and perineum. Pt had severely denuded areas to entire buttocks, perineum, near rectum and prolapsed uterus.  Pt has a history of C diff in the past. Wound type: Patchy areas of full thickness skin loss R/T constant stooling.  Appearance is consistent with moisture associated skin damage.   Pressure Ulcer POA: These are NOT pressure ulcers Measurement: Entire buttocks, inner groin and significantly prolapsed uterus are all affected. Wound bed: Bright red and moist.  Uterus is developing patchy areas of yellow ulceration R/T exposure to air and stool. Dressing procedure/placement/frequency: Assisted with turning and cleaning of stool, and she was already leaking stool again before turning and cleaning was completed.  Stool is too thick to be contained by Flexiseal, and rectal pouch would adhere to perirectal area related to severely denuded skin.  Pt on Sport low air loss bed to reduce pressure and increase airflow to affected areas.  Difficult to promote healing R/T critically ill and multiple systemic factors including incontinence, pressors, immobility, decreased albumin, no option at this time to contain stool.  Plan: Continue frequent turning, cleaning, and protecting with barrier cream to affected areas.  Please refer to previous consult note from GYN surgery on 7/18 by Dr Debroah Loop regarding prolapsed uterus and lack of options for treatment.  Topical care will not be effective in managing increasing ulcerations to this organ which are occurring R/T exposure to air and constant stooling.    Please re-consult if further assistance is needed.  Thank-you,  Cammie Mcgee MSN, RN, CWOCN, [place], CNS [phone]

## 2013-01-08 NOTE — Progress Notes (Signed)
Patient placed on pressure support 10/5 40% oxygen after adequate sedation with morphine. Monitors turned off. At 3:30pm decreased PS to 5cm. 3:35pm placed on 24% t-collar. Family wishes to remain with patient. RR averaging 16-18 breaths per minute, shallow. Will continue to monitor

## 2013-01-08 NOTE — Progress Notes (Signed)
Pt asystole on the monitor at 1555. Verdia Kuba, RN and Lasandra Beech, RN heard no audible breath sounds or heart sounds and no respirations or pulse present. Pt's pupils fixed and dilated and algor mortis setting in. Multiple family members at bedside at time of death. Chaplain and palliative care social worker met with the family at bedside. Bed control was notified, pt prepped for morgue and transported by Verdia Kuba, RN and NT.

## 2013-01-08 NOTE — Procedures (Signed)
Arterial Line Insertion Procedure Note JEANNEMARIE SAWAYA [id number] February 11, 1941  Procedure: Insertion of Arterial Line Indications: BP monitoring and frequent ABG's  Procedure Details Consent: Unable to obtain consent because of emergent medical necessity. Time Out: Verified patient identification, verified procedure, site/side was marked, verified correct patient position, special equipment/implants available, medications/allergies/relevent history reviewed, required imaging and test results available.  Performed  Maximum sterile technique was used including antiseptics, cap, gloves, gown, hand hygiene, mask and sheet. Skin prep: Chlorhexidine; local anesthetic administered A single lumen arterial catheter was placed in the L femoral artery using the Seldinger technique.  Ultrasound was used to verify the patency of the vein and for real time needle guidance.  Evaluation Blood flow good Complications: No apparent complications Patient did tolerate procedure well.  Ansley Stanwood 12/15/2012, 12:37 AM

## 2013-01-08 NOTE — Progress Notes (Signed)
eLink Physician-Brief Progress Note Patient Name: [name] DOB: Aug 04, 1940 MRN: [id number]  Date of Service  12/30/2012   HPI/Events of Note  Patient with severe sepsis.  Now with ongoing metabolic and lactic acidosis with pH of 7.1.  Current BP of 94/50 (62) on 300 mcg NEO.  On max vent support and cannot adjust vent rate up any further to compensate.   eICU Interventions  Plan: 2 amps Na Bicarb IV now Start Bicarb gtt with sterile water due to ongoing hyperglycemia on insulin. Recheck ABG in 2 hours   Intervention Category Major Interventions: Acid-Base disturbance - evaluation and management  DETERDING,ELIZABETH 12/21/2012, 12:09 AM

## 2013-01-08 DEATH — deceased

## 2013-01-11 NOTE — Discharge Summary (Signed)
DEATH SUMMARY  DATE OF ADMISSION:  01/20/2023  DATE OF DISCHARGE/DEATH:  01/21/2023  ADMISSION DIAGNOSES:   Acute hypoxemic respiratory failure HCAP  Severe sepsis  Septic shock  CHF (diastolic dysfunction) not decompensated  COPD HOCM  Afib with RVR  CKD Hyperkalemia Metabolic acidosis Hypercalcemia Pancreatic head mass  Chronic G tube Anemia, not actively bleeding  Decub ulcer Hyperglycemia, poss DKA  Acute encephalopathy due to sepsis   DISCHARGE DIAGNOSES:   HCAP ** Severe sepsis ** Septic shock ** Acute hypoxemic respiratory failure CHF (diastolic dysfunction) not decompensated  COPD HOCM  Afib with RVR  Acute renal failure CKD Hyperkalemia Lactic acidosis Hypercalcemia Pancreatic head mass  Tracheostomy status Chronic G tube Anemia, not actively bleeding  Decub ulcer DM II  Hyperglycemia, poss DKA  Acute encephalopathy due to sepsis  **proximate cause of death  PRESENTATION:   Pt was admitted with the following HPI and the above admission diagnoses:  72 y/o female currently staying at [facility] with CHF, HOCM recently discharged from this facility for VDRF in the setting of CHF exacerbation and HCAP was admitted on January 20, 2023 from the [facility] ED with septic shock from HCAP. The patient is mechanically ventilated and encephalopathic so history was obtained from chart review and discussion with ED staff. The [facility] chart notes that on January 20, 2023 she was found to have multi-lobar pneumonia, leukocytosis, hyperkalemia and hyperglycemia. In the ED she was in marked respiratory distress on the vent.   Following is the assessment on admission: 72 y/o female with chronic vent dependence who now has HCAP, possible DKA, and severe sepsis. I suspect she is going to develop septic shock. She is critically ill and has a very high expected mortality from this illness.   HOSPITAL COURSE:   See admission note for details regarding admission plan. She was admitted to [facility] service and ICU. She was  treated with mechanical ventilation, broad spectrum abx and vasopressors. On the morning following admission, the following was documented by Dr Sung Amabile:  It is clear that a favorable outcome will not be possible here. I talked to family in detail re: goals of care and suggested that full comfort care would be very appropriate @ this point. After further discussion among themselves this is what they have opted for. Once other family members arrive, we will proceed with full comfort and discontinuation of vasopressors and vent support  Palliative Care consultation was obtained at the request of the family.  The process of vent withdrawal and full comfort care was initiated and the patient passed away peacefully shortly thereafter.    Billy Fischer, MD;  PCCM service; Mobile [phone]

## 2013-11-22 NOTE — Telephone Encounter (Signed)
Close encounter 

## 2015-01-12 IMAGING — DX DG CHEST 1V PORT
1 series · 1 of 1 positions shown · non-contrast
Comparison: Plain film chest 10/31/2012.

CLINICAL DATA: Respiratory distress.

PORTABLE CHEST - 1 VIEW

[portable]
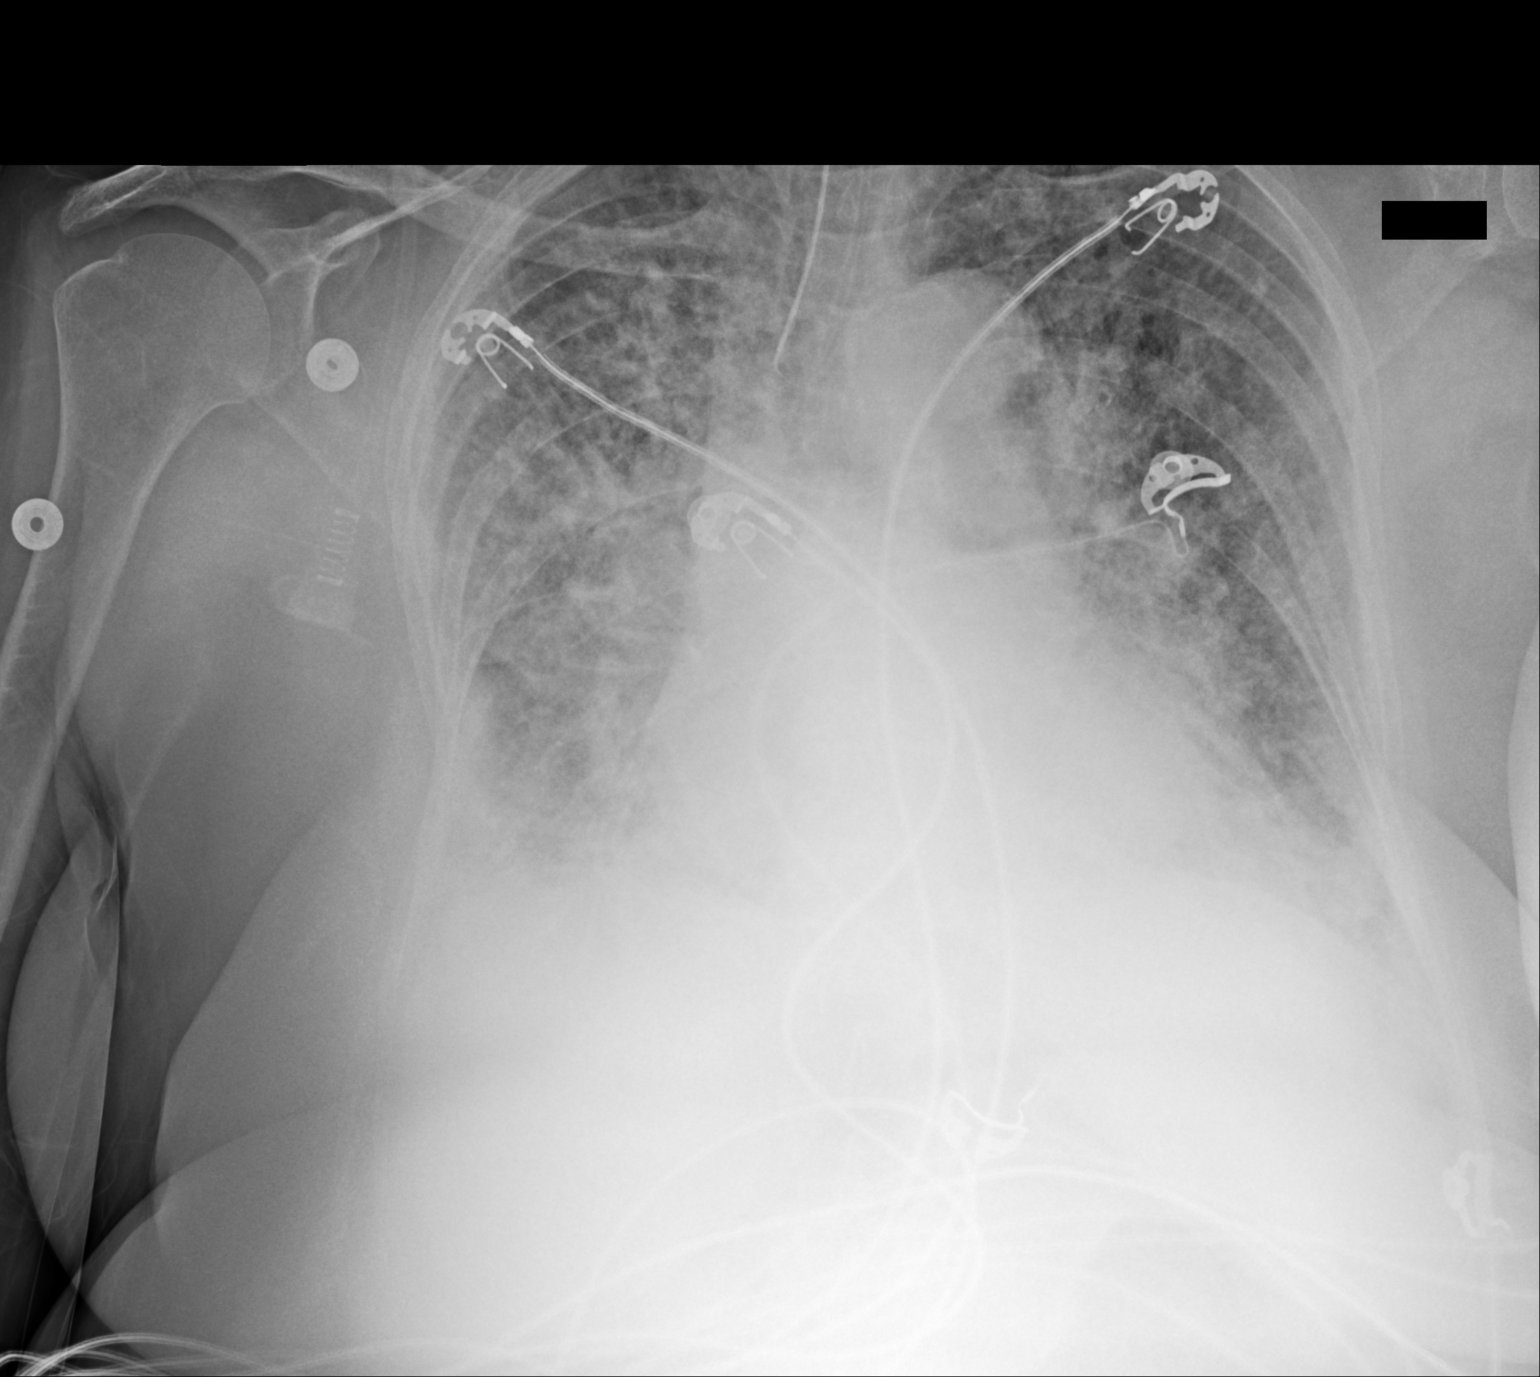

[1 of 1 positions shown; findings below may reference images not displayed]

FINDINGS: The patient has a new endotracheal tube in place with the
tip in good position 2.7 cm above the carina.  There is extensive
bilateral airspace disease and small effusions, larger on the
right.  Cardiomegaly is noted.
IMPRESSION: 1.  ET tube in good position.
2.  Extensive bilateral airspace disease and small effusions likely
due to pulmonary edema.

## 2015-01-13 IMAGING — CR DG CHEST 1V PORT
1 series · 1 of 1 positions shown · non-contrast
Comparison: 11/09/2012

CLINICAL DATA: Check ETT

PORTABLE CHEST - 1 VIEW

[AP]
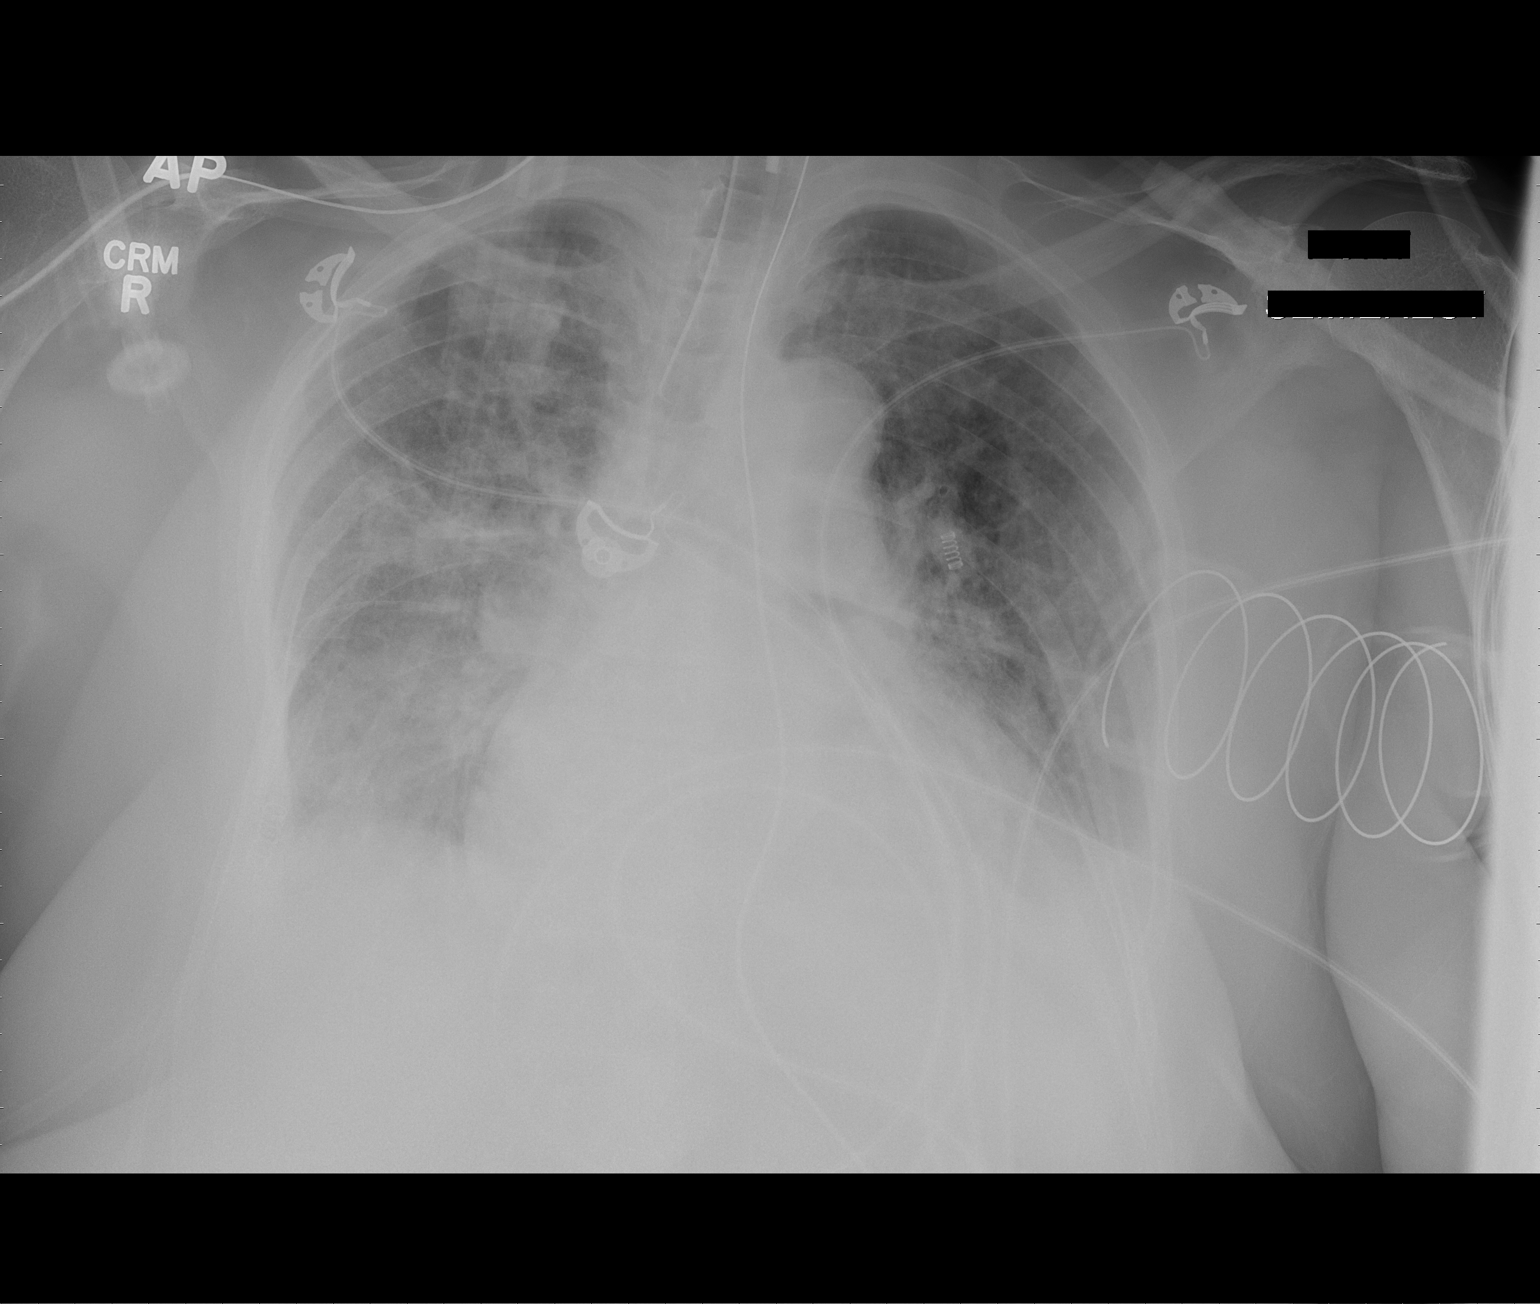

[1 of 1 positions shown; findings below may reference images not displayed]

FINDINGS: Cardiomegaly with suspected mild interstitial edema.
Superimposed right upper lobe pneumonia is possible.  Patchy left
lower lobe opacity, likely a combination of atelectasis and small
pleural effusion.  No pneumothorax.

Endotracheal tube terminates 4 cm above the carina.

Enteric tube courses into the stomach.
IMPRESSION: Endotracheal tube terminates 4 cm above the carina.

Cardiomegaly with mild interstitial edema and small left pleural
effusion.

Superimposed right upper lobe pneumonia is possible.

## 2015-01-14 IMAGING — CR DG CHEST 1V PORT
1 series · 1 of 1 positions shown · non-contrast
Comparison: 11/10/2012

CLINICAL DATA: evaluate lungs and line placement

PORTABLE CHEST - 1 VIEW

[AP]
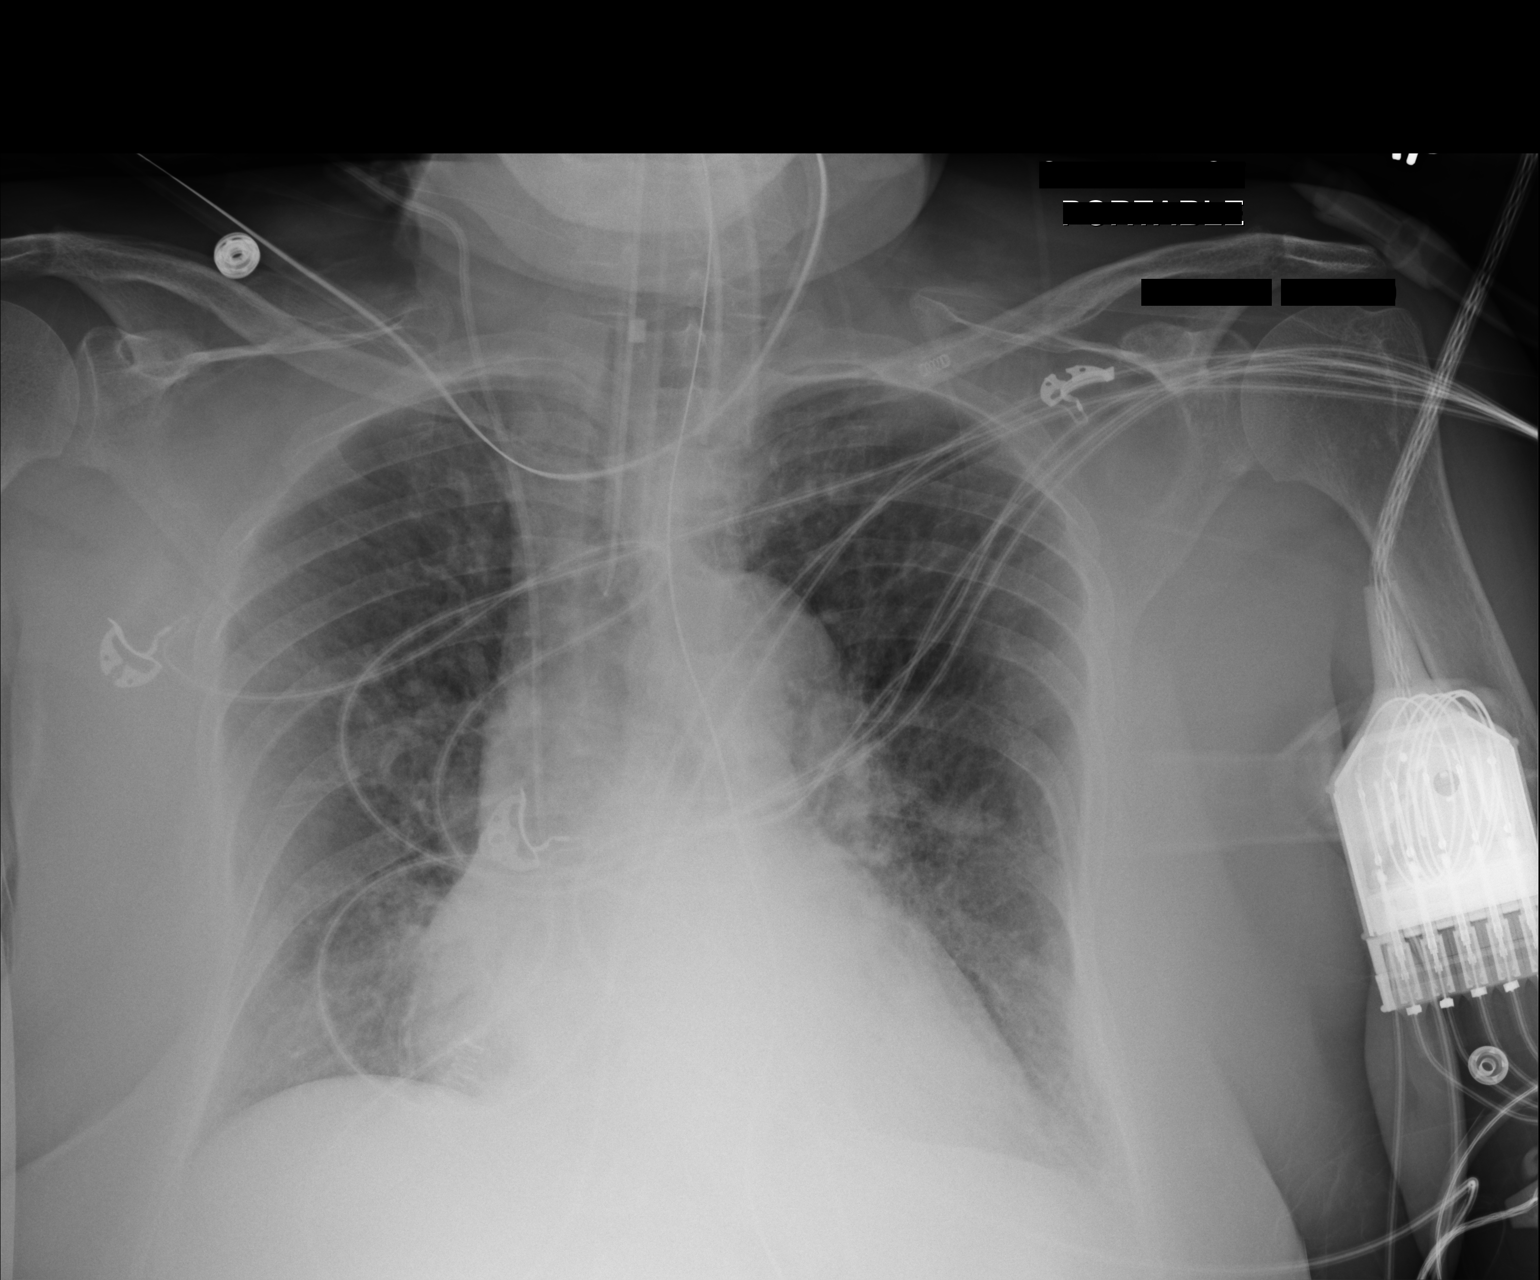

[1 of 1 positions shown; findings below may reference images not displayed]

FINDINGS: Endotracheal tube tip is above the carina.  There is a
right IJ catheter with tip in the cavoatrial junction.  Enteric
tube tip is within the stomach.  There is mild cardiac enlargement
and pulmonary vascular congestion.  No airspace consolidation.
IMPRESSION: 1.  Stable support apparatus.
2.  Cardiac enlargement and pulmonary vascular congestion.

## 2015-01-14 IMAGING — CR DG CHEST 1V PORT
1 series · 1 of 1 positions shown · non-contrast
Comparison: Prior chest x-ray 11/10/2012

CLINICAL DATA: Hypoxia

PORTABLE CHEST - 1 VIEW

[AP]
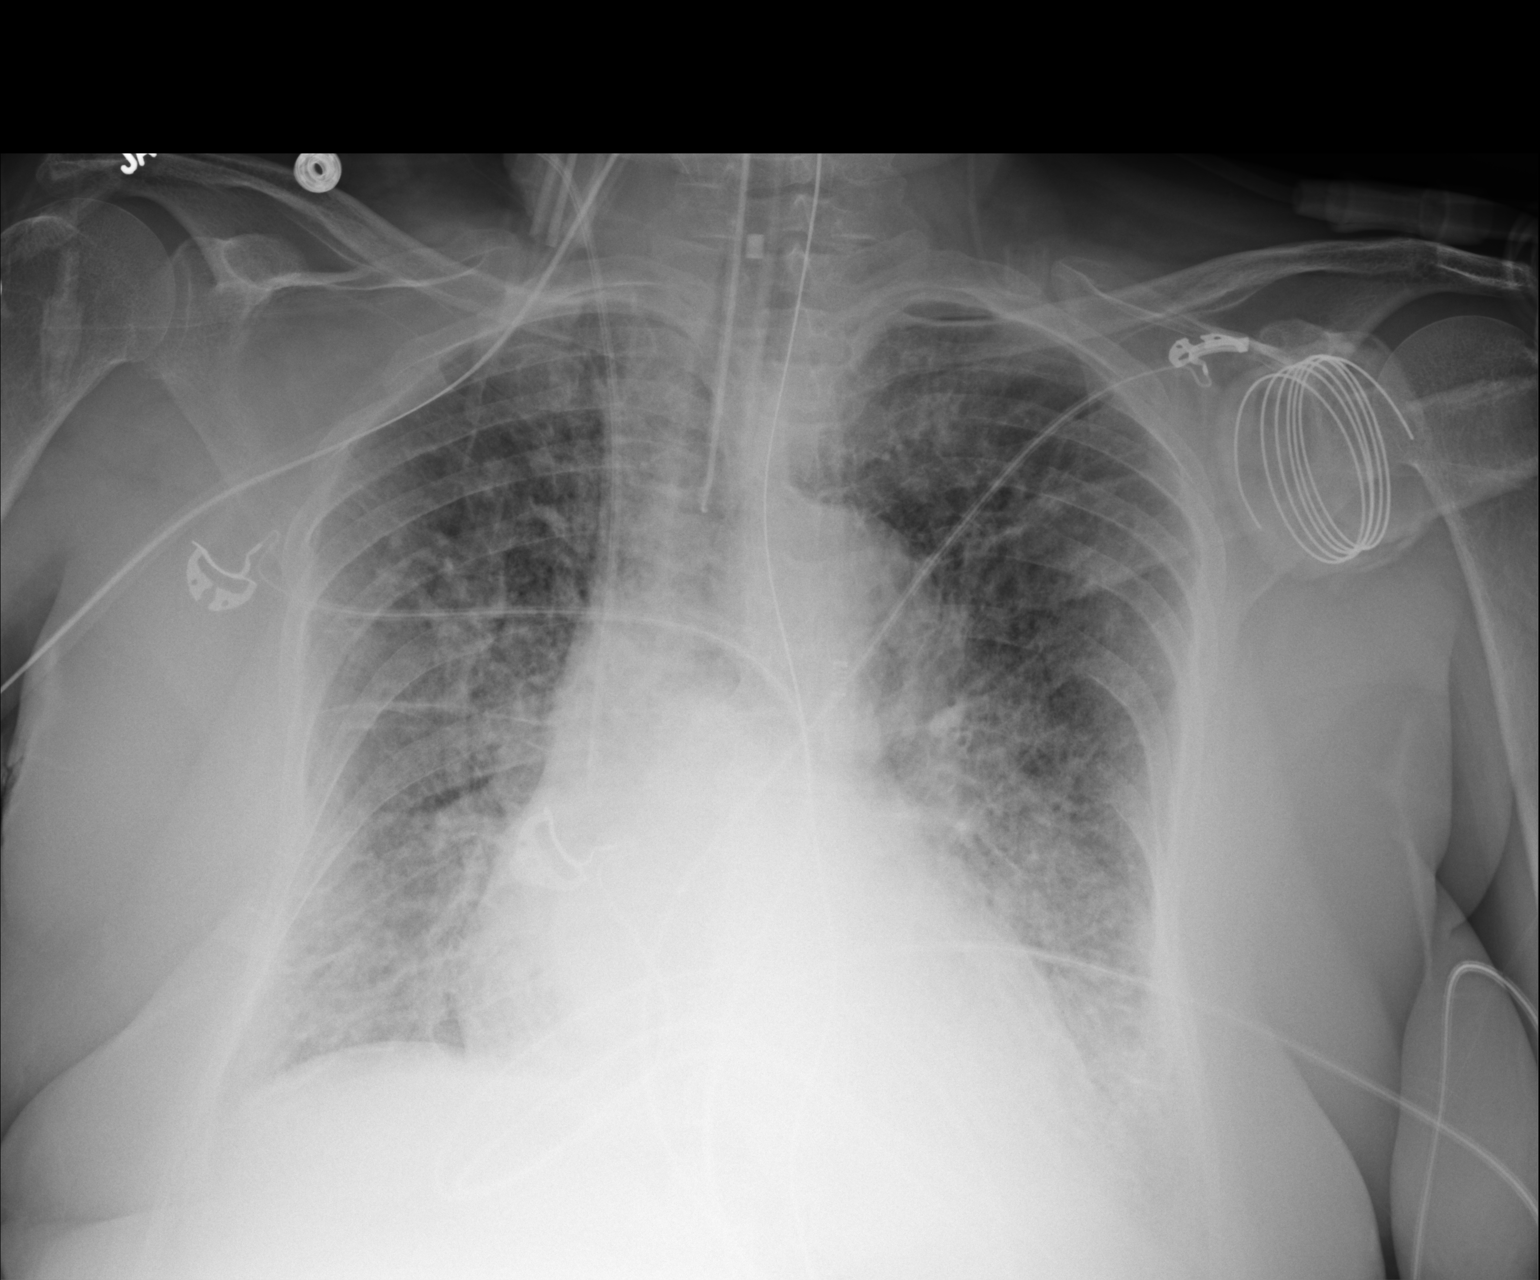

[1 of 1 positions shown; findings below may reference images not displayed]

FINDINGS: Endotracheal tube 4.9 cm above the carina.  Stable
position of right IJ central venous catheter in the visualized
portion of the nasogastric tube.  Increased bilateral interstitial
and airspace opacities consistent with increasing pulmonary edema.
Stable cardiomegaly.  Probable left pleural effusion.  No
pneumothorax.  No acute osseous abnormality.
IMPRESSION: Increasing pulmonary edema.

Stable satisfactory support apparatus.

## 2015-01-17 IMAGING — CR DG CHEST 1V PORT
1 series · 1 of 1 positions shown · non-contrast
Comparison: 11/13/2012

CLINICAL DATA: Shortness of breath.

PORTABLE CHEST - 1 VIEW

[AP]
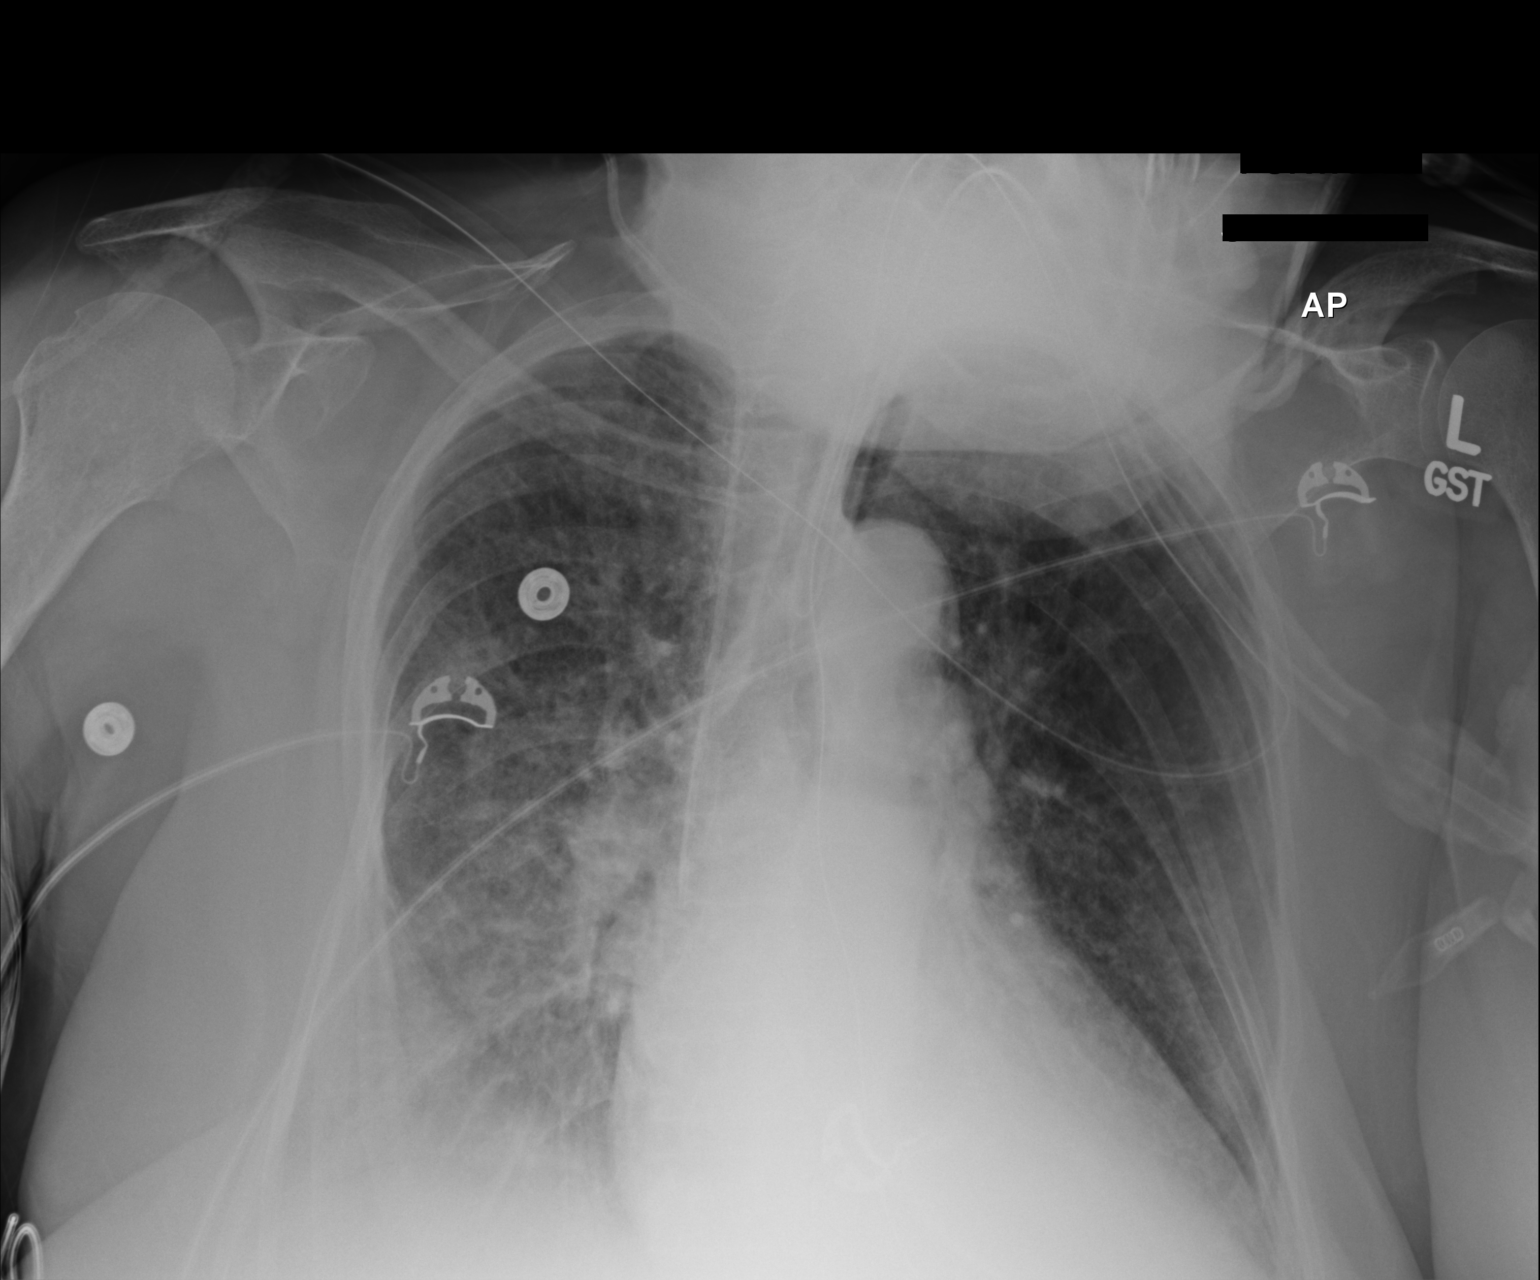

[1 of 1 positions shown; findings below may reference images not displayed]

FINDINGS: Endotracheal tube tip measures about 3.6 cm above the
carina.  Enteric tube tip is out of the field of view but below the
left hemidiaphragm.  Right central venous catheter tip in the
cavoatrial junction.  Cardiac enlargement with mild pulmonary
vascular congestion and perihilar edema.  Edema pattern appears
slightly improved.  Probable right pleural effusion.
IMPRESSION: Appliances appear in satisfactory position.  Cardiac enlargement
with pulmonary vascular congestion and improving edema.  Right
pleural effusion.
